# Patient Record
Sex: Male | Born: 1940
Health system: Southern US, Community
[De-identification: ages and names within clinical notes are randomized; demographics above are authoritative.]

## PROBLEM LIST (undated history)

## (undated) DIAGNOSIS — N189 Chronic kidney disease, unspecified: Secondary | ICD-10-CM

## (undated) DIAGNOSIS — I10 Essential (primary) hypertension: Secondary | ICD-10-CM

## (undated) DIAGNOSIS — J449 Chronic obstructive pulmonary disease, unspecified: Secondary | ICD-10-CM

## (undated) DIAGNOSIS — E119 Type 2 diabetes mellitus without complications: Secondary | ICD-10-CM

## (undated) DIAGNOSIS — I509 Heart failure, unspecified: Secondary | ICD-10-CM

## (undated) DIAGNOSIS — N289 Disorder of kidney and ureter, unspecified: Secondary | ICD-10-CM

## (undated) DIAGNOSIS — J45909 Unspecified asthma, uncomplicated: Secondary | ICD-10-CM

## (undated) HISTORY — DX: Chronic kidney disease, unspecified: N18.9

## (undated) HISTORY — DX: Heart failure, unspecified: I50.9

## (undated) HISTORY — DX: Type 2 diabetes mellitus without complications: E11.9

## (undated) HISTORY — DX: Chronic obstructive pulmonary disease, unspecified: J44.9

## (undated) HISTORY — PX: APPENDECTOMY: SHX54

## (undated) HISTORY — PX: CHOLECYSTECTOMY: SHX55

## (undated) HISTORY — PX: NEPHRECTOMY: SHX65

## (undated) HISTORY — PX: NASAL SINUS SURGERY: SHX719

---

## 2005-06-11 ENCOUNTER — Ambulatory Visit: Payer: Self-pay | Admitting: Cardiology

## 2005-06-21 ENCOUNTER — Ambulatory Visit: Payer: Self-pay | Admitting: Cardiology

## 2005-06-25 ENCOUNTER — Ambulatory Visit: Payer: Self-pay | Admitting: Cardiology

## 2005-08-26 ENCOUNTER — Ambulatory Visit: Payer: Self-pay | Admitting: Cardiology

## 2006-10-08 HISTORY — PX: COLONOSCOPY: SHX174

## 2010-12-16 ENCOUNTER — Ambulatory Visit (INDEPENDENT_AMBULATORY_CARE_PROVIDER_SITE_OTHER): Payer: Self-pay | Admitting: Internal Medicine

## 2011-10-28 ENCOUNTER — Encounter: Payer: Self-pay | Admitting: Internal Medicine

## 2011-10-28 ENCOUNTER — Other Ambulatory Visit: Payer: Self-pay | Admitting: Internal Medicine

## 2011-11-18 ENCOUNTER — Other Ambulatory Visit: Payer: Self-pay

## 2011-11-24 ENCOUNTER — Encounter: Payer: Self-pay | Admitting: Internal Medicine

## 2011-11-26 ENCOUNTER — Encounter: Payer: Self-pay | Admitting: *Deleted

## 2011-11-29 ENCOUNTER — Encounter: Payer: Self-pay | Admitting: *Deleted

## 2011-11-29 ENCOUNTER — Other Ambulatory Visit: Payer: Self-pay | Admitting: Cardiology

## 2011-11-29 ENCOUNTER — Encounter: Payer: Self-pay | Admitting: Cardiology

## 2011-11-29 ENCOUNTER — Ambulatory Visit (INDEPENDENT_AMBULATORY_CARE_PROVIDER_SITE_OTHER): Payer: Medicare Other | Admitting: Cardiology

## 2011-11-29 ENCOUNTER — Telehealth: Payer: Self-pay | Admitting: *Deleted

## 2011-11-29 VITALS — BP 142/69 | HR 87 | Ht 66.0 in | Wt 206.8 lb

## 2011-11-29 DIAGNOSIS — R9430 Abnormal result of cardiovascular function study, unspecified: Secondary | ICD-10-CM

## 2011-11-29 DIAGNOSIS — N189 Chronic kidney disease, unspecified: Secondary | ICD-10-CM | POA: Insufficient documentation

## 2011-11-29 DIAGNOSIS — E119 Type 2 diabetes mellitus without complications: Secondary | ICD-10-CM

## 2011-11-29 DIAGNOSIS — I428 Other cardiomyopathies: Secondary | ICD-10-CM

## 2011-11-29 DIAGNOSIS — I429 Cardiomyopathy, unspecified: Secondary | ICD-10-CM | POA: Insufficient documentation

## 2011-11-29 DIAGNOSIS — R0602 Shortness of breath: Secondary | ICD-10-CM

## 2011-11-29 DIAGNOSIS — E1122 Type 2 diabetes mellitus with diabetic chronic kidney disease: Secondary | ICD-10-CM | POA: Insufficient documentation

## 2011-11-29 DIAGNOSIS — R079 Chest pain, unspecified: Secondary | ICD-10-CM

## 2011-11-29 DIAGNOSIS — R0789 Other chest pain: Secondary | ICD-10-CM

## 2011-11-29 LAB — PROTIME-INR

## 2011-11-29 NOTE — Assessment & Plan Note (Signed)
The patient has a mildly reduced ejection fraction. I will followup with an echocardiogram.

## 2011-11-29 NOTE — Patient Instructions (Signed)
Your physician recommends that you continue on your current medications as directed. Please refer to the Current Medication list given to you today. Your physician has requested that you have an echocardiogram. Echocardiography is a painless test that uses sound waves to create images of your heart. It provides your doctor with information about the size and shape of your heart and how well your heart's chambers and valves are working. This procedure takes approximately one hour. There are no restrictions for this procedure. Your physician has requested that you have a cardiac catheterization. Cardiac catheterization is used to diagnose and/or treat various heart conditions. Doctors may recommend this procedure for a number of different reasons. The most common reason is to evaluate chest pain. Chest pain can be a symptom of coronary artery disease (CAD), and cardiac catheterization can show whether plaque is narrowing or blocking your heart's arteries. This procedure is also used to evaluate the valves, as well as measure the blood flow and oxygen levels in different parts of your heart. For further information please visit www.cardiosmart.org. Please follow instruction sheet, as given.  

## 2011-11-29 NOTE — Assessment & Plan Note (Signed)
His most recent hemoglobin A1c was 6.0. He will continue on the meds as listed.

## 2011-11-29 NOTE — Assessment & Plan Note (Signed)
As above.

## 2011-11-29 NOTE — Telephone Encounter (Signed)
ECHO BEFORE CATH ON THURS. PRE-CERT CATH (L) JV LAB W/HYDRATION 4/25 0730 FOR 1230 CASE W/HOCHREIN

## 2011-11-29 NOTE — Assessment & Plan Note (Signed)
The probability of obstructive coronary disease is very high given the abnormal EKG, symptoms and stress test. Cardiac catheterization is indicated. He does have only one kidney and mild renal insufficiency. I have discussed this with the patient and his family. At this point I think cardiac catheterization is indicated with limited diet. He'll have hydration for several hours prior. I do note that his GFR was approximately 50 recently. The patient understands that risks included but are not limited to stroke (1 in 1000), death (1 in 1000), kidney failure [usually temporary] (1 in 500), bleeding (1 in 200), allergic reaction [possibly serious] (1 in 200).  The patient understands and agrees to proceed.

## 2011-11-29 NOTE — Progress Notes (Signed)
HPI The patient has no prior history of coronary disease though he has long-standing diabetes. He's been describing some chest discomfort. There are various types of discomfort. He has bilateral pain under his breasts it seems to be constant. He is also describing chest pressure with walking. This has been going on for some time. He does get short of breath with walking and increasing fatigue with activities. He also had some discomfort after eating. He is finding it difficult to quantify or qualify this discomfort. The exertional discomfort somewhat moderate. 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  He does not describe associated nausea vomiting or diaphoresis. It is not describing palpitations, presyncope or syncope. He has no PND or orthopnea.  Because of this discomfort, risk factors and the abnormal EKG he had a stress perfusion study which demonstrated EF of 43% with anterolateral apical ischemia.  Allergies  Allergen Reactions  . Amoxicillin   . Ciprofloxacin   . Sulfa Antibiotics     Current Outpatient Prescriptions  Medication Sig Dispense Refill  . albuterol (PROAIR HFA) 108 (90 BASE) MCG/ACT inhaler Inhale 2 puffs into the lungs every 6 (six) hours as needed.      . Alpha-Lipoic Acid 200 MG TABS Take 1 tablet by mouth 2 (two) times daily.      [name] doxycycline (VIBRAMYCIN) 100 MG capsule Take 100 mg by mouth 2 (two) times daily.      . ferrous sulfate 325 (65 FE) MG tablet Take 325 mg by mouth daily.      [name] glipiZIDE (GLUCOTROL XL) 5 MG 24 hr tablet Take 5 mg by mouth daily.      . isosorbide mononitrate (IMDUR) 30 MG 24 hr tablet Take 30 mg by mouth daily.      . montelukast (SINGULAIR) 10 MG tablet Take 10 mg by mouth at bedtime.      . Omega-3 Fatty Acids (FISH OIL) 1000 MG CAPS Take 1 capsule by mouth 2 (two) times daily.      . vitamin B-12 (CYANOCOBALAMIN) 500 MCG tablet Take 500 mcg by mouth daily.      . vitamin E 200 UNIT  capsule Take 400 Units by mouth daily.      . Multiple Vitamin (MULTIVITAMIN) tablet Take 1 tablet by mouth daily.        Past Medical History  Diagnosis Date  . Type 2 diabetes mellitus   . COPD (chronic obstructive pulmonary disease)   . Chronic renal insufficiency     Past Surgical History  Procedure Date  . Cholecystectomy   . Nephrectomy     RIGHT (Question CA.  No further therapy needed.)  . Nasal sinus surgery   . Appendectomy     Family History  Problem Relation Age of Onset  . Lung cancer Brother   . Stroke Mother   . Breast cancer Mother     History   Social History  . Marital Status: Married    Spouse Name: N/A    Number of Children: N/A  . Years of Education: N/A   Occupational History  . Not on file.   Social History Main Topics  . Smoking status: Former Smoker    Quit date: 08/09/1976  . Smokeless tobacco: Not on file  . Alcohol Use: Not on file  . Drug Use: Not on file  . Sexually Active: Not on file   Other Topics Concern  . Not on file  Social History Narrative   Lives with wife.  Three children.      ROS:    PHYSICAL EXAM BP 142/69  Pulse 87  Ht 5\' 6"  (1.676 m)  Wt 206 lb 12.8 oz (93.804 kg)  BMI 33.38 kg/m2 GENERAL:  Well appearing HEENT:  Pupils equal round and reactive, fundi not visualized, oral mucosa unremarkable, upper dentures. NECK:  No jugular venous distention, waveform within normal limits, carotid upstroke brisk and symmetric, no bruits, no thyromegaly LYMPHATICS:  No cervical, inguinal adenopathy LUNGS:  Clear to auscultation bilaterally BACK:  No CVA tenderness CHEST:  Unremarkable HEART:  PMI not displaced or sustained,S1 and S2 within normal limits, no S3, no S4, no clicks, no rubs, no murmurs ABD:  Flat, positive bowel sounds normal in frequency in pitch, no bruits, no rebound, no guarding, no midline pulsatile mass, no hepatomegaly, no splenomegaly EXT:  2 plus pulses throughout, no edema, no cyanosis no  clubbing SKIN:  No rashes no nodules NEURO:  Cranial nerves II through XII grossly intact, motor grossly intact throughout PSYCH:  Cognitively intact, oriented to person place and time  EKG:  Normal sinus rhythm, rate 88, right axis deviation, intervals within normal limits, lateral T-wave inversions consistent with ischemia, no old EKGs to compare. 11/29/2011  ASSESSMENT AND PLAN

## 2011-11-30 NOTE — Telephone Encounter (Signed)
[employer] not eligible until 12/08/11.  Spoke with Misty Stanley and Mathews @ [employer].  Also verified by automated system.

## 2011-12-02 ENCOUNTER — Inpatient Hospital Stay (HOSPITAL_BASED_OUTPATIENT_CLINIC_OR_DEPARTMENT_OTHER)
Admission: RE | Admit: 2011-12-02 | Discharge: 2011-12-02 | Disposition: A | Discharge status: Home / Self Care | Payer: Medicare Other | Source: Ambulatory Visit | Attending: Cardiology | Admitting: Cardiology

## 2011-12-02 ENCOUNTER — Encounter (HOSPITAL_BASED_OUTPATIENT_CLINIC_OR_DEPARTMENT_OTHER)
Admission: RE | Disposition: A | Discharge status: Home / Self Care | Payer: Self-pay | Source: Ambulatory Visit | Attending: Cardiology

## 2011-12-02 DIAGNOSIS — R9430 Abnormal result of cardiovascular function study, unspecified: Secondary | ICD-10-CM

## 2011-12-02 DIAGNOSIS — I428 Other cardiomyopathies: Secondary | ICD-10-CM | POA: Insufficient documentation

## 2011-12-02 DIAGNOSIS — E119 Type 2 diabetes mellitus without complications: Secondary | ICD-10-CM | POA: Insufficient documentation

## 2011-12-02 LAB — POCT I-STAT GLUCOSE
Glucose, Bld: 77 mg/dL (ref 70–99)
Operator id: 141321

## 2011-12-02 SURGERY — JV LEFT HEART CATHETERIZATION WITH CORONARY ANGIOGRAM
Anesthesia: Moderate Sedation

## 2011-12-02 MED ORDER — ASPIRIN 81 MG PO CHEW
324.0000 mg | CHEWABLE_TABLET | ORAL | Status: AC
  Administered 2011-12-02: 324 mg via ORAL

## 2011-12-02 MED ORDER — SODIUM CHLORIDE 0.9 % IJ SOLN: 3.0000 mL | INTRAMUSCULAR | Status: DC | PRN

## 2011-12-02 MED ORDER — SODIUM CHLORIDE 0.9 % IV SOLN: INTRAVENOUS | Status: DC

## 2011-12-02 MED ORDER — SODIUM CHLORIDE 0.9 % IJ SOLN: 3.0000 mL | Freq: Two times a day (BID) | INTRAMUSCULAR | Status: DC

## 2011-12-02 MED ORDER — SODIUM CHLORIDE 0.9 % IV SOLN
INTRAVENOUS | Status: DC
  Administered 2011-12-02: 08:00:00 via INTRAVENOUS

## 2011-12-02 MED ORDER — SODIUM CHLORIDE 0.9 % IV SOLN: 250.0000 mL | INTRAVENOUS | Status: DC | PRN

## 2011-12-02 MED ORDER — ONDANSETRON HCL 4 MG/2ML IJ SOLN: 4.0000 mg | Freq: Four times a day (QID) | INTRAMUSCULAR | Status: DC | PRN

## 2011-12-02 MED ORDER — ACETAMINOPHEN 325 MG PO TABS: 650.0000 mg | ORAL_TABLET | ORAL | Status: DC | PRN

## 2011-12-02 NOTE — OR Nursing (Signed)
Tegaderm dressing applied, site level 0, bedrest begins at 1215 

## 2011-12-02 NOTE — Interval H&P Note (Signed)
History and Physical Interval Note:  12/02/2011 12:11 PM  Gregory Mitchell  has presented today for surgery, with the diagnosis of abnormal scan  The various methods of treatment have been discussed with the patient and family. After consideration of risks, benefits and other options for treatment, the patient has consented to  Procedure(s) (LRB): JV LEFT HEART CATHETERIZATION WITH CORONARY ANGIOGRAM (N/A) as a surgical intervention .  The patients' history has been reviewed, patient examined, no change in status, stable for surgery.  I have reviewed the patients' chart and labs.  Questions were answered to the patient's satisfaction.     Rollene Rotunda

## 2011-12-02 NOTE — H&P (View-Only) (Signed)
 HPI The patient has no prior history of coronary disease though he has long-standing diabetes. He's been describing some chest discomfort. There are various types of discomfort. He has bilateral pain under his breasts it seems to be constant. He is also describing chest pressure with walking. This has been going on for some time. He does get short of breath with walking and increasing fatigue with activities. He also had some discomfort after eating. He is finding it difficult to quantify or qualify this discomfort. The exertional discomfort somewhat moderate. 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  He does not describe associated nausea vomiting or diaphoresis. It is not describing palpitations, presyncope or syncope. He has no PND or orthopnea.  Because of this discomfort, risk factors and the abnormal EKG he had a stress perfusion study which demonstrated EF of 43% with anterolateral apical ischemia.  Allergies  Allergen Reactions  . Amoxicillin   . Ciprofloxacin   . Sulfa Antibiotics     Current Outpatient Prescriptions  Medication Sig Dispense Refill  . albuterol (PROAIR HFA) 108 (90 BASE) MCG/ACT inhaler Inhale 2 puffs into the lungs every 6 (six) hours as needed.      . Alpha-Lipoic Acid 200 MG TABS Take 1 tablet by mouth 2 (two) times daily.      . doxycycline (VIBRAMYCIN) 100 MG capsule Take 100 mg by mouth 2 (two) times daily.      . ferrous sulfate 325 (65 FE) MG tablet Take 325 mg by mouth daily.      . glipiZIDE (GLUCOTROL XL) 5 MG 24 hr tablet Take 5 mg by mouth daily.      . isosorbide mononitrate (IMDUR) 30 MG 24 hr tablet Take 30 mg by mouth daily.      . montelukast (SINGULAIR) 10 MG tablet Take 10 mg by mouth at bedtime.      . Omega-3 Fatty Acids (FISH OIL) 1000 MG CAPS Take 1 capsule by mouth 2 (two) times daily.      . vitamin B-12 (CYANOCOBALAMIN) 500 MCG tablet Take 500 mcg by mouth daily.      . vitamin E 200 UNIT  capsule Take 400 Units by mouth daily.      . Multiple Vitamin (MULTIVITAMIN) tablet Take 1 tablet by mouth daily.        Past Medical History  Diagnosis Date  . Type 2 diabetes mellitus   . COPD (chronic obstructive pulmonary disease)   . Chronic renal insufficiency     Past Surgical History  Procedure Date  . Cholecystectomy   . Nephrectomy     RIGHT (Question CA.  No further therapy needed.)  . Nasal sinus surgery   . Appendectomy     Family History  Problem Relation Age of Onset  . Lung cancer Brother   . Stroke Mother   . Breast cancer Mother     History   Social History  . Marital Status: Married    Spouse Name: N/A    Number of Children: N/A  . Years of Education: N/A   Occupational History  . Not on file.   Social History Main Topics  . Smoking status: Former Smoker    Quit date: 08/09/1976  . Smokeless tobacco: Not on file  . Alcohol Use: Not on file  . Drug Use: Not on file  . Sexually Active: Not on file   Other Topics Concern  . Not on file     Social History Narrative   Lives with wife.  Three children.      ROS:    PHYSICAL EXAM BP 142/69  Pulse 87  Ht 5' 6" (1.676 m)  Wt 206 lb 12.8 oz (93.804 kg)  BMI 33.38 kg/m2 GENERAL:  Well appearing HEENT:  Pupils equal round and reactive, fundi not visualized, oral mucosa unremarkable, upper dentures. NECK:  No jugular venous distention, waveform within normal limits, carotid upstroke brisk and symmetric, no bruits, no thyromegaly LYMPHATICS:  No cervical, inguinal adenopathy LUNGS:  Clear to auscultation bilaterally BACK:  No CVA tenderness CHEST:  Unremarkable HEART:  PMI not displaced or sustained,S1 and S2 within normal limits, no S3, no S4, no clicks, no rubs, no murmurs ABD:  Flat, positive bowel sounds normal in frequency in pitch, no bruits, no rebound, no guarding, no midline pulsatile mass, no hepatomegaly, no splenomegaly EXT:  2 plus pulses throughout, no edema, no cyanosis no  clubbing SKIN:  No rashes no nodules NEURO:  Cranial nerves II through XII grossly intact, motor grossly intact throughout PSYCH:  Cognitively intact, oriented to person place and time  EKG:  Normal sinus rhythm, rate 88, right axis deviation, intervals within normal limits, lateral T-wave inversions consistent with ischemia, no old EKGs to compare. 11/29/2011  ASSESSMENT AND PLAN    

## 2011-12-02 NOTE — CV Procedure (Signed)
  Cardiac Catheterization Procedure Note  Name: Gregory Mitchell MRN: [id number] DOB: 25-Sep-1940  Procedure: Left Heart Cath, Selective Coronary Angiography, LV angiography  Indication:  Cardiomyopathy, abnormal Myoview  Procedural details: The right groin was prepped, draped, and anesthetized with 1% lidocaine. Using modified Seldinger technique, a 4 French sheath was introduced into the right femoral artery. Standard Judkins catheters were used for coronary angiography and left ventriculography. Catheter exchanges were performed over a guidewire. There were no immediate procedural complications. The patient was transferred to the post catheterization recovery area for further monitoring.  Procedural Findings:   Hemodynamics:     AO 157/77    LV 154/22   Coronary angiography:  Coronary dominance: Right  Left mainstem:   Normal  Left anterior descending (LAD):  Large vessel wrapping the apex.   Mild lumina irregularities.  D1 small normal.  D2 small normal.  Left circumflex (LCx):  AV group luminal irregularities.  OM1 large and normal.  PL x 2 small normal  Right coronary artery (RCA):  Dominant.  Long mid 25%.  PDA normal.  PL moderate and normal.    Left ventriculography:  Left ventricle not injected  Final Conclusions:  Mild coronary plaque.  Non ischemic cardiomyopathy  Recommendations:   Medical management.    Fayrene Fearing Milbern Doescher 12/02/2011, 12:12 PM

## 2011-12-02 NOTE — OR Nursing (Signed)
Dr Hochrein at bedside to discuss results and treatment plan with pt and family 

## 2011-12-02 NOTE — OR Nursing (Signed)
Discharge instructions reviewed and signed, pt stated understanding, ambulated in hall without difficulty, site level 0, transported to wife's car via wheelchair 

## 2012-01-11 ENCOUNTER — Encounter: Payer: Self-pay | Admitting: Cardiology

## 2012-01-11 ENCOUNTER — Ambulatory Visit (INDEPENDENT_AMBULATORY_CARE_PROVIDER_SITE_OTHER): Payer: Medicare Other | Admitting: Cardiology

## 2012-01-11 VITALS — BP 144/76 | HR 89 | Resp 18 | Ht 66.0 in | Wt 206.0 lb

## 2012-01-11 DIAGNOSIS — E669 Obesity, unspecified: Secondary | ICD-10-CM | POA: Insufficient documentation

## 2012-01-11 DIAGNOSIS — I429 Cardiomyopathy, unspecified: Secondary | ICD-10-CM

## 2012-01-11 DIAGNOSIS — N189 Chronic kidney disease, unspecified: Secondary | ICD-10-CM

## 2012-01-11 MED ORDER — LISINOPRIL 5 MG PO TABS: 5.0000 mg | ORAL_TABLET | Freq: Every day | ORAL | Status: DC

## 2012-01-11 NOTE — Patient Instructions (Addendum)
Your physician recommends that you schedule a follow-up appointment in: 1 month with Gene.  Your physician has recommended you make the following change in your medication: start lisinopril 5 mg daily. Your new prescription has been sent to your pharmacy. All other medications remain the same.  Your physician recommends that you return for lab work in: June 18th 2013 at [facility].

## 2012-01-11 NOTE — Assessment & Plan Note (Signed)
This is nonanginal and likely related to a GI etiology. I have suggested followup with his primary provider.

## 2012-01-11 NOTE — Assessment & Plan Note (Signed)
The patient understands the need to lose weight with diet and exercise. We have discussed specific strategies for this.  

## 2012-01-11 NOTE — Progress Notes (Signed)
   HPI The patient presents for followup of a cardiomyopathy. He has been found to have an EF of about 35-40% by echo. However, catheterization demonstrated only minimal coronary plaque. He does still describe some chest discomfort as mentioned previously. However, this seems to be related to food.  He denies any new symptoms.  He does have some chronic dyspnea with exertion but is not describing new PND or orthopnea. He had no problems following a catheterization.  Allergies  Allergen Reactions  . Amoxicillin   . Ciprofloxacin   . Sulfa Antibiotics     Current Outpatient Prescriptions  Medication Sig Dispense Refill  . albuterol (PROAIR HFA) 108 (90 BASE) MCG/ACT inhaler Inhale 2 puffs into the lungs every 6 (six) hours as needed.      . Alpha-Lipoic Acid 200 MG TABS Take 1 tablet by mouth 2 (two) times daily.      . Cimetidine (ACID REDUCER PO) Take by mouth 2 (two) times daily.      . ferrous sulfate 325 (65 FE) MG tablet Take 325 mg by mouth daily.      [name] glipiZIDE (GLUCOTROL XL) 5 MG 24 hr tablet Take 5 mg by mouth daily.      . montelukast (SINGULAIR) 10 MG tablet Take 10 mg by mouth at bedtime.      . Omega-3 Fatty Acids (FISH OIL) 1000 MG CAPS Take 1 capsule by mouth 2 (two) times daily.      . Tamsulosin HCl (FLOMAX) 0.4 MG CAPS Take 0.4 mg by mouth daily.      . vitamin B-12 (CYANOCOBALAMIN) 500 MCG tablet Take 500 mcg by mouth daily.      . vitamin E 200 UNIT capsule Take 400 Units by mouth daily.        Past Medical History  Diagnosis Date  . Type 2 diabetes mellitus   . COPD (chronic obstructive pulmonary disease)   . Chronic renal insufficiency     Past Surgical History  Procedure Date  . Cholecystectomy   . Nephrectomy     RIGHT (Question CA.  No further therapy needed.)  . Nasal sinus surgery   . Appendectomy     ROS:  As stated in the HPI and negative for all other systems.   PHYSICAL EXAM BP 144/76  Pulse 89  Resp 18  Ht 5\' 6"  (1.676 m)  Wt 206 lb  (93.441 kg)  BMI 33.25 kg/m2 GENERAL:  Well appearing HEENT:  Pupils equal round and reactive, fundi not visualized, oral mucosa unremarkable, upper dentures. NECK:  No jugular venous distention, waveform within normal limits, carotid upstroke brisk and symmetric, no bruits, no thyromegaly LYMPHATICS:  No cervical, inguinal adenopathy LUNGS:  Clear to auscultation bilaterally BACK:  No CVA tenderness CHEST:  Unremarkable HEART:  PMI not displaced or sustained,S1 and S2 within normal limits, no S3, no S4, no clicks, no rubs, no murmurs ABD:  Flat, positive bowel sounds normal in frequency in pitch, no bruits, no rebound, no guarding, no midline pulsatile mass, no hepatomegaly, no splenomegaly EXT:  2 plus pulses throughout, no edema, no cyanosis no clubbing    ASSESSMENT AND PLAN

## 2012-01-11 NOTE — Assessment & Plan Note (Signed)
This is nonischemic and possibly related to hypertension. I'm going to start med titration low-dose ACE inhibitor keeping a close eye on his renal function given his previous renal insufficiency. We will get a basic metabolic profile in 2 weeks.

## 2012-01-28 ENCOUNTER — Telehealth: Payer: Self-pay | Admitting: *Deleted

## 2012-01-28 NOTE — Telephone Encounter (Signed)
Patient informed via message machine. 

## 2012-01-28 NOTE — Telephone Encounter (Signed)
Message copied by Eustace Moore on Fri Jan 28, 2012  9:42 AM ------      Message from: Rollene Rotunda      Created: Thu Jan 27, 2012  9:17 AM       Labs OK

## 2012-02-11 ENCOUNTER — Ambulatory Visit (INDEPENDENT_AMBULATORY_CARE_PROVIDER_SITE_OTHER): Payer: Medicare Other | Admitting: Physician Assistant

## 2012-02-11 ENCOUNTER — Encounter: Payer: Self-pay | Admitting: Physician Assistant

## 2012-02-11 VITALS — BP 134/72 | HR 95 | Temp 101.4°F | Ht 68.0 in | Wt 201.0 lb

## 2012-02-11 DIAGNOSIS — R509 Fever, unspecified: Secondary | ICD-10-CM | POA: Insufficient documentation

## 2012-02-11 DIAGNOSIS — I429 Cardiomyopathy, unspecified: Secondary | ICD-10-CM

## 2012-02-11 DIAGNOSIS — N189 Chronic kidney disease, unspecified: Secondary | ICD-10-CM

## 2012-02-11 DIAGNOSIS — E119 Type 2 diabetes mellitus without complications: Secondary | ICD-10-CM

## 2012-02-11 MED ORDER — ASPIRIN EC 81 MG PO TBEC: 81.0000 mg | DELAYED_RELEASE_TABLET | Freq: Every day | ORAL | Status: DC

## 2012-02-11 MED ORDER — LOSARTAN POTASSIUM 50 MG PO TABS: 50.0000 mg | ORAL_TABLET | Freq: Every day | ORAL | Status: DC

## 2012-02-11 NOTE — Patient Instructions (Addendum)
Your physician recommends that you schedule a follow-up appointment in: 1 months with Dr. Antoine Poche.  Your physician has recommended you make the following change in your medication: stop lisinopril and start losartan 50 mg daily. Also start taking an enteric coated aspirin 81 daily. Your new prescriptions have been sent to your pharmacy. Your physician recommends that you return for lab work in: 1 week around July 12th 2013 at [facility]. Please go to Dr. Bartholomew Crews office now for office visit.

## 2012-02-11 NOTE — Assessment & Plan Note (Signed)
Followed by primary M.D. Ideally, he should be on a statin, which we will consider at next OV. He is on omega-3 fatty acids.

## 2012-02-11 NOTE — Assessment & Plan Note (Signed)
Stable by recent followup labs: BUN 10, creatinine 1.4, potassium 3.9.

## 2012-02-11 NOTE — Progress Notes (Signed)
Primary Cardiologist: Rollene Rotunda, MD   HPI: Patient presents for scheduled followup.  When last seen, he was started on low-dose lisinopril, for ongoing treatment of nonischemic cardio myopathy. Unfortunately, he developed impotence, but continued to take this medication. He completed his 30 day supply just yesterday.  Patient also has recently developed productive, yellowish colored sputum, and presents today with a fever of 101.4. He otherwise denies symptoms suggestive of decompensated heart failure.  Patient has lost 5 pounds since last OV. BP is also better controlled.  Allergies  Allergen Reactions  . Amoxicillin   . Ciprofloxacin   . Sulfa Antibiotics     Current Outpatient Prescriptions  Medication Sig Dispense Refill  . albuterol (PROAIR HFA) 108 (90 BASE) MCG/ACT inhaler Inhale 2 puffs into the lungs every 6 (six) hours as needed.      . Alpha-Lipoic Acid 200 MG TABS Take 1 tablet by mouth 2 (two) times daily.      [name] aspirin EC 81 MG tablet Take 81 mg by mouth daily.      . Cimetidine (ACID REDUCER PO) Take by mouth 2 (two) times daily.      . ferrous sulfate 325 (65 FE) MG tablet Take 325 mg by mouth daily.      [name] glipiZIDE (GLUCOTROL XL) 5 MG 24 hr tablet Take 5 mg by mouth daily.      [name] losartan (COZAAR) 50 MG tablet Take 50 mg by mouth daily.      . montelukast (SINGULAIR) 10 MG tablet Take 10 mg by mouth at bedtime.      . Omega-3 Fatty Acids (FISH OIL) 1000 MG CAPS Take 1 capsule by mouth 2 (two) times daily.      . Tamsulosin HCl (FLOMAX) 0.4 MG CAPS Take 0.4 mg by mouth daily.      . vitamin B-12 (CYANOCOBALAMIN) 500 MCG tablet Take 500 mcg by mouth daily.      . vitamin E 200 UNIT capsule Take 400 Units by mouth daily.        Past Medical History  Diagnosis Date  . Type 2 diabetes mellitus   . COPD (chronic obstructive pulmonary disease)   . Chronic renal insufficiency     Past Surgical History  Procedure Date  . Cholecystectomy   . Nephrectomy    RIGHT (Question CA.  No further therapy needed.)  . Nasal sinus surgery   . Appendectomy     History   Social History  . Marital Status: Married    Spouse Name: N/A    Number of Children: N/A  . Years of Education: N/A   Occupational History  . Not on file.   Social History Main Topics  . Smoking status: Former Smoker -- 1.0 packs/day for 15 years    Types: Cigarettes    Quit date: 08/09/1976  . Smokeless tobacco: Former Neurosurgeon    Types: Chew    Quit date: 02/10/2006   Comment: chewed tobacco for 10 years  . Alcohol Use: Not on file  . Drug Use: Not on file  . Sexually Active: Not on file   Other Topics Concern  . Not on file   Social History Narrative   Lives with wife.  Three children.      Family History  Problem Relation Age of Onset  . Lung cancer Brother   . Stroke Mother   . Breast cancer Mother     ROS: no nausea, vomiting; no fever, chills; no melena, hematochezia; no claudication  PHYSICAL EXAM: BP 134/72  Pulse 95  Temp 101.4 F (38.6 C)  Ht 5\' 8"  (1.727 m)  Wt 201 lb (91.173 kg)  BMI 30.56 kg/m2  SpO2 95% GENERAL: 71 year-old male, sitting upright; NAD HEENT: NCAT, PERRLA, EOMI; sclera clear; no xanthelasma; mild, by mask NECK: palpable bilateral carotid pulses, no bruits; no JVD; no TM LUNGS: Coarse expiratory wheezes and rhonchi, throughout CARDIAC: RRR (S1, S2); no significant murmurs; no rubs or gallops ABDOMEN: soft, non-tender; intact BS EXTREMETIES: intact distal pulses; no significant peripheral edema SKIN: warm/dry; no obvious rash/lesions MUSCULOSKELETAL: no joint deformity NEURO: no focal deficit; NL affect   EKG:    ASSESSMENT & PLAN:  Fever Will refer the patient directly to Dr. Olena Leatherwood 's office today, for evaluation and management of fever with associated productive cough.  Cardiomyopathy secondary Will discontinue lisinopril, in light of patient's reported development of impotence. Will substituted with 50 mg daily.  Of note, I considered adding carvedilol to his regimen. However, given his reported development of impotence, I am hesitant about adding a beta blocker, particularly a nonselective one. We will see how he first responds to Cozaar, and then consider carvedilol. Will check followup BMET in one week. Patient will also be started on ASA 81 mg daily. Return clinic visit with myself/Dr. Antoine Poche one month.  Chronic renal insufficiency Stable by recent followup labs: BUN 10, creatinine 1.4, potassium 3.9.  Type 2 diabetes mellitus Followed by primary M.D. Ideally, he should be on a statin, which we will consider at next OV. He is on omega-3 fatty acids.    Gene Naylani Bradner, PAC

## 2012-02-11 NOTE — Assessment & Plan Note (Signed)
Will discontinue lisinopril, in light of patient's reported development of impotence. Will substituted with 50 mg daily. Of note, I considered adding carvedilol to his regimen. However, given his reported development of impotence, I am hesitant about adding a beta blocker, particularly a nonselective one. We will see how he first responds to Cozaar, and then consider carvedilol. Will check followup BMET in one week. Patient will also be started on ASA 81 mg daily. Return clinic visit with myself/Dr. Antoine Poche one month.

## 2012-02-11 NOTE — Assessment & Plan Note (Signed)
Will refer the patient directly to Dr. Olena Leatherwood 's office today, for evaluation and management of fever with associated productive cough.

## 2012-02-22 ENCOUNTER — Telehealth: Payer: Self-pay | Admitting: *Deleted

## 2012-02-22 NOTE — Telephone Encounter (Signed)
Patient informed. 

## 2012-02-22 NOTE — Telephone Encounter (Signed)
Message copied by Eustace Moore on Tue Feb 22, 2012 11:11 AM ------      Message from: Lesle Chris      Created: Mon Feb 21, 2012  4:01 PM       [facility]            ----- Message -----         From: Prescott Parma, PA         Sent: 02/21/2012  12:59 PM           To: Lesle Chris, LPN            NL labs

## 2012-03-14 ENCOUNTER — Encounter: Payer: Self-pay | Admitting: Cardiology

## 2012-03-14 ENCOUNTER — Ambulatory Visit (INDEPENDENT_AMBULATORY_CARE_PROVIDER_SITE_OTHER): Payer: Medicare Other | Admitting: Cardiology

## 2012-03-14 VITALS — BP 129/67 | HR 87 | Ht 66.0 in | Wt 202.8 lb

## 2012-03-14 DIAGNOSIS — N189 Chronic kidney disease, unspecified: Secondary | ICD-10-CM

## 2012-03-14 DIAGNOSIS — I429 Cardiomyopathy, unspecified: Secondary | ICD-10-CM

## 2012-03-14 MED ORDER — SILDENAFIL CITRATE 25 MG PO TABS: 25.0000 mg | ORAL_TABLET | Freq: Every day | ORAL | Status: DC | PRN

## 2012-03-14 MED ORDER — OMEPRAZOLE 20 MG PO CPDR: 20.0000 mg | DELAYED_RELEASE_CAPSULE | Freq: Every day | ORAL | Status: DC

## 2012-03-14 NOTE — Patient Instructions (Signed)
   Stop Cimetidine   Change to Prilosec 20mg  daily  Viagra as needed Follow up in  2 months

## 2012-03-14 NOTE — Progress Notes (Signed)
HPI The patient presents for followup of a cardiomyopathy. He has been found to have an EF of about 35-40% by echo. However, catheterization demonstrated only minimal coronary plaque.  At the last visit he was taken off of ACE inhibitor and switch to Cozaar because of cough. He says he coughs when he eats. He does have some chronic lung disease and long-standing followup with Dr. Orson Aloe.  He says he didn't notice any change in his cough when switched to Cozaar. He's not describing new PND or orthopnea. He's not been having any chest pressure, neck or arm discomfort. He's had no palpitations, presyncope or syncope. He continues to complain of erectile dysfunction.  Allergies  Allergen Reactions  . Amoxicillin   . Ciprofloxacin   . Sulfa Antibiotics     Current Outpatient Prescriptions  Medication Sig Dispense Refill  . albuterol (PROAIR HFA) 108 (90 BASE) MCG/ACT inhaler Inhale 2 puffs into the lungs every 6 (six) hours as needed.      . Alpha-Lipoic Acid 200 MG TABS Take 1 tablet by mouth 2 (two) times daily.      [name] aspirin EC 81 MG tablet Take 1 tablet (81 mg total) by mouth daily.  30 tablet  6  . Cimetidine (ACID REDUCER PO) Take by mouth 2 (two) times daily.      . ferrous sulfate 325 (65 FE) MG tablet Take 325 mg by mouth daily.      . fluticasone (FLONASE) 50 MCG/ACT nasal spray Place 2 sprays into the nose 2 (two) times daily.       [name] glipiZIDE (GLUCOTROL XL) 5 MG 24 hr tablet Take 5 mg by mouth daily.      [name] losartan (COZAAR) 50 MG tablet Take 1 tablet (50 mg total) by mouth daily.  30 tablet  6  . montelukast (SINGULAIR) 10 MG tablet Take 10 mg by mouth at bedtime.      . Omega-3 Fatty Acids (FISH OIL) 1000 MG CAPS Take 1 capsule by mouth 2 (two) times daily.      [name] omeprazole (PRILOSEC) 40 MG capsule Take 40 mg by mouth daily.       . predniSONE (DELTASONE) 20 MG tablet Take 20 mg by mouth as needed.       . SYMBICORT 80-4.5 MCG/ACT inhaler Inhale 2 puffs into the lungs 2  (two) times daily.       . Tamsulosin HCl (FLOMAX) 0.4 MG CAPS Take 0.4 mg by mouth daily.      . vitamin B-12 (CYANOCOBALAMIN) 500 MCG tablet Take 500 mcg by mouth daily.      . vitamin E 200 UNIT capsule Take 400 Units by mouth daily.        Past Medical History  Diagnosis Date  . Type 2 diabetes mellitus   . COPD (chronic obstructive pulmonary disease)   . Chronic renal insufficiency     Past Surgical History  Procedure Date  . Cholecystectomy   . Nephrectomy     RIGHT (Question CA.  No further therapy needed.)  . Nasal sinus surgery   . Appendectomy     ROS:  As stated in the HPI and negative for all other systems.   PHYSICAL EXAM BP 129/67  Pulse 87  Ht 5\' 6"  (1.676 m)  Wt 202 lb 12.8 oz (91.989 kg)  BMI 32.73 kg/m2 GENERAL:  Well appearing HEENT:  Pupils equal round and reactive, fundi not visualized, oral mucosa unremarkable, upper dentures, poor dentition NECK:  No  jugular venous distention, waveform within normal limits, carotid upstroke brisk and symmetric, no bruits, no thyromegaly LYMPHATICS:  No cervical, inguinal adenopathy LUNGS:  Diffuse coarse scattered rhonchi, no fine crackles, no wheezing BACK:  No CVA tenderness CHEST:  Unremarkable HEART:  PMI not displaced or sustained,S1 and S2 within normal limits, no S3, no S4, no clicks, no rubs, no murmurs ABD:  Flat, positive bowel sounds normal in frequency in pitch, no bruits, no rebound, no guarding, no midline pulsatile mass, no hepatomegaly, no splenomegaly EXT:  2 plus pulses throughout, no edema, no cyanosis no clubbing   ASSESSMENT AND PLAN  Fever -  At the last visit he was complaining of fever that was treated with antibiotics by  [facility] A, MD.  His symptoms have resolved. No change in therapy is indicated.   Cardiomyopathy secondary -  I'm going to continue the current dose of Cozaar. I will not titrate at this visit but I might in the future. I'm going to avoid beta blockers particularly  because of his chronic lung disease with some evidence of reactive airway disease.  Chronic renal insufficiency -  This has been followed in a stable.  Cough -  I am going to switch to Prilosec as he may be having some element of reflux leading to his cough. Otherwise I last follow with HASANAJ,XAJE A, MD

## 2012-05-15 ENCOUNTER — Ambulatory Visit: Payer: Medicare Other | Admitting: Cardiology

## 2012-06-20 ENCOUNTER — Encounter: Payer: Self-pay | Admitting: Cardiology

## 2012-06-20 ENCOUNTER — Ambulatory Visit (INDEPENDENT_AMBULATORY_CARE_PROVIDER_SITE_OTHER): Payer: PRIVATE HEALTH INSURANCE | Admitting: Cardiology

## 2012-06-20 VITALS — BP 149/77 | HR 80 | Ht 66.0 in | Wt 209.0 lb

## 2012-06-20 DIAGNOSIS — R0789 Other chest pain: Secondary | ICD-10-CM

## 2012-06-20 DIAGNOSIS — T17908A Unspecified foreign body in respiratory tract, part unspecified causing other injury, initial encounter: Secondary | ICD-10-CM

## 2012-06-20 DIAGNOSIS — N189 Chronic kidney disease, unspecified: Secondary | ICD-10-CM

## 2012-06-20 DIAGNOSIS — R079 Chest pain, unspecified: Secondary | ICD-10-CM

## 2012-06-20 MED ORDER — SILDENAFIL CITRATE 100 MG PO TABS: 100.0000 mg | ORAL_TABLET | Freq: Every day | ORAL | Status: DC | PRN

## 2012-06-20 MED ORDER — LOSARTAN POTASSIUM 50 MG PO TABS: 50.0000 mg | ORAL_TABLET | Freq: Two times a day (BID) | ORAL | Status: DC

## 2012-06-20 NOTE — Patient Instructions (Addendum)
Your physician recommends that you schedule a follow-up appointment in: 2 months. Your physician has recommended you make the following change in your medication: Increase losartan 50 mg to twice daily. Your new prescription has been sent to your pharmacy. Also you have been given a prescription for viagra 100 mg to be used daily as needed. All other medications will remain the same. Your physician recommends that you return for lab work in 10 days around June 30, 2012 at [facility] for [employer]. You will be having a swallowing study at [facility].

## 2012-06-20 NOTE — Progress Notes (Signed)
HPI The patient presents for followup of a cardiomyopathy. He has been found to have an EF of about 35-40% by echo. However, catheterization demonstrated only minimal coronary plaque.  Since I last saw him he has had continued chest pain and shortness of breath only when he needs. This happens routinely. He otherwise is not describing any PND or orthopnea. He has not been having any palpitations, presyncope or syncope. He has not been having any chest pressure, neck or arm discomfort. He's been having no weight gain or edema.  Allergies  Allergen Reactions  . Amoxicillin   . Ciprofloxacin   . Sulfa Antibiotics     Current Outpatient Prescriptions  Medication Sig Dispense Refill  . albuterol (PROAIR HFA) 108 (90 BASE) MCG/ACT inhaler Inhale 2 puffs into the lungs every 6 (six) hours as needed.      . Alpha-Lipoic Acid 200 MG TABS Take 1 tablet by mouth 2 (two) times daily.      [name] aspirin EC 81 MG tablet Take 1 tablet (81 mg total) by mouth daily.  30 tablet  6  . doxycycline (VIBRA-TABS) 100 MG tablet Take 100 mg by mouth 2 (two) times daily. x10 days      . ferrous sulfate 325 (65 FE) MG tablet Take 325 mg by mouth daily.      . fluticasone (FLONASE) 50 MCG/ACT nasal spray Place 2 sprays into the nose 2 (two) times daily.       [name] glipiZIDE (GLUCOTROL XL) 5 MG 24 hr tablet Take 5 mg by mouth daily.      [name] losartan (COZAAR) 50 MG tablet Take 1 tablet (50 mg total) by mouth daily.  30 tablet  6  . montelukast (SINGULAIR) 10 MG tablet Take 10 mg by mouth at bedtime.      . Omega-3 Fatty Acids (FISH OIL) 1000 MG CAPS Take 1 capsule by mouth 2 (two) times daily.      [name] omeprazole (PRILOSEC) 20 MG capsule Take 1 capsule (20 mg total) by mouth daily.  30 capsule  6  . predniSONE (DELTASONE) 20 MG tablet Take 20 mg by mouth as needed.       . sildenafil (VIAGRA) 25 MG tablet Take 1 tablet (25 mg total) by mouth daily as needed for erectile dysfunction.  10 tablet  0  . SYMBICORT 80-4.5 MCG/ACT  inhaler Inhale 2 puffs into the lungs 2 (two) times daily.       . Tamsulosin HCl (FLOMAX) 0.4 MG CAPS Take 0.4 mg by mouth daily.      . vitamin B-12 (CYANOCOBALAMIN) 500 MCG tablet Take 500 mcg by mouth daily.      . vitamin E 200 UNIT capsule Take 400 Units by mouth daily.        Past Medical History  Diagnosis Date  . Type 2 diabetes mellitus   . COPD (chronic obstructive pulmonary disease)   . Chronic renal insufficiency     Past Surgical History  Procedure Date  . Cholecystectomy   . Nephrectomy     RIGHT (Question CA.  No further therapy needed.)  . Nasal sinus surgery   . Appendectomy     ROS:  As stated in the HPI and negative for all other systems.   PHYSICAL EXAM BP 149/77  Pulse 80  Ht 5\' 6"  (1.676 m)  Wt 209 lb (94.802 kg)  BMI 33.73 kg/m2  SpO2 94% GENERAL:  Well appearing HEENT:  Pupils equal round and reactive, fundi not  visualized, oral mucosa unremarkable, upper dentures, poor dentition NECK:  No jugular venous distention, waveform within normal limits, carotid upstroke brisk and symmetric, no bruits, no thyromegaly LYMPHATICS:  No cervical, inguinal adenopathy LUNGS:  Diffuse coarse scattered rhonchi, no fine crackles, no wheezing BACK:  No CVA tenderness CHEST:  Unremarkable HEART:  PMI not displaced or sustained,S1 and S2 within normal limits, no S3, no S4, no clicks, no rubs, no murmurs ABD:  Flat, positive bowel sounds normal in frequency in pitch, no bruits, no rebound, no guarding, no midline pulsatile mass, no hepatomegaly, no splenomegaly EXT:  2 plus pulses throughout, no edema, no cyanosis no clubbing   ASSESSMENT AND PLAN    Cardiomyopathy secondary -  I'm going to increase the current dose of Cozaar to 50 mg bid.   Chronic renal insufficiency -  I will check a BMET in 10 days.    Cough - I am schedule a swalling study to rule out aspiration.  HASANAJ,XAJE A, MD  HTN -  This is being managed in the context of treating his  CHF

## 2012-06-22 ENCOUNTER — Telehealth: Payer: Self-pay

## 2012-06-22 ENCOUNTER — Other Ambulatory Visit: Payer: Self-pay | Admitting: Cardiology

## 2012-06-22 DIAGNOSIS — T17908A Unspecified foreign body in respiratory tract, part unspecified causing other injury, initial encounter: Secondary | ICD-10-CM

## 2012-06-22 NOTE — Telephone Encounter (Signed)
SWALLOWING procedure set for 11-21 @ [facility] Checking percert

## 2012-06-27 NOTE — Telephone Encounter (Signed)
No precert required 

## 2012-07-04 ENCOUNTER — Telehealth: Payer: Self-pay | Admitting: *Deleted

## 2012-07-04 NOTE — Telephone Encounter (Signed)
Message copied by Eustace Moore on Tue Jul 04, 2012 10:16 AM ------      Message from: Rollene Rotunda      Created: Sun Jul 02, 2012  9:17 AM       No evidence of swallowing difficulty.  The etiology for the SOB with eating is not clear.  Call Mr. Vandevelde with the results and send results to [facility] . No further cardiac work up is planned.

## 2012-07-04 NOTE — Telephone Encounter (Signed)
Patient informed and copy sent to PCP. 

## 2012-07-26 ENCOUNTER — Telehealth: Payer: Self-pay | Admitting: *Deleted

## 2012-07-26 DIAGNOSIS — N189 Chronic kidney disease, unspecified: Secondary | ICD-10-CM

## 2012-07-26 NOTE — Telephone Encounter (Signed)
Given the slight increase in creat he should get a repeat BMET. ----- Message ----- From: Eustace Moore, LPN Sent: 16/05/9603 11:40 AM To: Rollene Rotunda, MD   Patient informed and lab order faxed to [facility] lab.

## 2012-07-27 ENCOUNTER — Telehealth: Payer: Self-pay | Admitting: Physician Assistant

## 2012-07-28 ENCOUNTER — Telehealth: Payer: Self-pay | Admitting: *Deleted

## 2012-07-28 NOTE — Telephone Encounter (Signed)
Patient informed and copy sent to PCP. 

## 2012-07-28 NOTE — Telephone Encounter (Signed)
Message copied by Eustace Moore on Fri Jul 28, 2012 11:19 AM ------      Message from: Rollene Rotunda      Created: Thu Jul 27, 2012  6:28 PM       Labs OK.  Call Mr. Corella with the results and send results to [facility] A, MD

## 2012-08-14 ENCOUNTER — Ambulatory Visit: Payer: PRIVATE HEALTH INSURANCE | Admitting: Cardiology

## 2012-08-23 ENCOUNTER — Encounter: Payer: Self-pay | Admitting: Cardiology

## 2012-08-23 ENCOUNTER — Encounter: Payer: Self-pay | Admitting: Physician Assistant

## 2012-08-23 ENCOUNTER — Ambulatory Visit (INDEPENDENT_AMBULATORY_CARE_PROVIDER_SITE_OTHER): Payer: Medicare Other | Admitting: Physician Assistant

## 2012-08-23 VITALS — BP 145/81 | HR 85 | Ht 66.0 in | Wt 211.0 lb

## 2012-08-23 DIAGNOSIS — R0602 Shortness of breath: Secondary | ICD-10-CM

## 2012-08-23 DIAGNOSIS — R0609 Other forms of dyspnea: Secondary | ICD-10-CM

## 2012-08-23 DIAGNOSIS — R0989 Other specified symptoms and signs involving the circulatory and respiratory systems: Secondary | ICD-10-CM

## 2012-08-23 DIAGNOSIS — E119 Type 2 diabetes mellitus without complications: Secondary | ICD-10-CM

## 2012-08-23 DIAGNOSIS — I10 Essential (primary) hypertension: Secondary | ICD-10-CM

## 2012-08-23 MED ORDER — ATORVASTATIN CALCIUM 20 MG PO TABS: 20.0000 mg | ORAL_TABLET | Freq: Every evening | ORAL | Status: DC

## 2012-08-23 NOTE — Assessment & Plan Note (Signed)
EF 35-40%, by echocardiogram, 11/2011. Cardiac catheterization yielded nonobstructive CAD. Recommend repeat echocardiogram in near future, if patient has persistent or worsening symptoms.

## 2012-08-23 NOTE — Assessment & Plan Note (Signed)
Continue current medication regimen and reassess at next OV.

## 2012-08-23 NOTE — Progress Notes (Signed)
Primary Cardiologist: Rollene Rotunda, MD   HPI: Scheduled 2 month followup.  When last seen, Dr. Antoine Poche increased losartan to 50 twice a day, for ongoing management of NICM. Followup labs, as follows:   - December 18: BUN 15, creatinine 1.4, potassium 4.3  Patient denies any significant change from baseline level of exercise tolerance, since last OV. He continues to have significant DOE, but only when walking up a flight of stairs or a slight incline. He denies any symptoms when walking on level ground. He denies any exertional CP. He reports compliance with medications, but does use some salt when preparing his food, but not afterwards. He does not weigh himself daily. He reportedly has had difficulty with his "asthma", and requires inhalers and nebulizer treatment at home. He quit smoking approximately 30 years ago. His weight is up 2 pounds since last OV.  Allergies  Allergen Reactions  . Amoxicillin   . Ciprofloxacin   . Sulfa Antibiotics     Current Outpatient Prescriptions  Medication Sig Dispense Refill  . albuterol (PROAIR HFA) 108 (90 BASE) MCG/ACT inhaler Inhale 2 puffs into the lungs every 6 (six) hours as needed.      . Alpha-Lipoic Acid 200 MG TABS Take 1 tablet by mouth 2 (two) times daily.      [name] aspirin EC 81 MG tablet Take 1 tablet (81 mg total) by mouth daily.  30 tablet  6  . doxycycline (VIBRA-TABS) 100 MG tablet Take 100 mg by mouth 2 (two) times daily. x10 days      . ferrous sulfate 325 (65 FE) MG tablet Take 325 mg by mouth daily.      . fluticasone (FLONASE) 50 MCG/ACT nasal spray Place 2 sprays into the nose 2 (two) times daily.       [name] glipiZIDE (GLUCOTROL XL) 5 MG 24 hr tablet Take 5 mg by mouth daily.      [name] losartan (COZAAR) 50 MG tablet Take 1 tablet (50 mg total) by mouth 2 (two) times daily.  60 tablet  3  . montelukast (SINGULAIR) 10 MG tablet Take 10 mg by mouth at bedtime.      . Omega-3 Fatty Acids (FISH OIL) 1000 MG CAPS Take 1 capsule by mouth 2  (two) times daily.      [name] omeprazole (PRILOSEC) 20 MG capsule Take 1 capsule (20 mg total) by mouth daily.  30 capsule  6  . predniSONE (DELTASONE) 20 MG tablet Take 20 mg by mouth as needed.       . sildenafil (VIAGRA) 100 MG tablet Take 1 tablet (100 mg total) by mouth daily as needed for erectile dysfunction.  10 tablet  0  . SYMBICORT 80-4.5 MCG/ACT inhaler Inhale 2 puffs into the lungs 2 (two) times daily.       . Tamsulosin HCl (FLOMAX) 0.4 MG CAPS Take 0.4 mg by mouth daily.      . vitamin B-12 (CYANOCOBALAMIN) 500 MCG tablet Take 500 mcg by mouth daily.      . vitamin E 200 UNIT capsule Take 400 Units by mouth daily.        Past Medical History  Diagnosis Date  . Type 2 diabetes mellitus   . COPD (chronic obstructive pulmonary disease)   . Chronic renal insufficiency     Past Surgical History  Procedure Date  . Cholecystectomy   . Nephrectomy     RIGHT (Question CA.  No further therapy needed.)  . Nasal sinus surgery   .  Appendectomy     History   Social History  . Marital Status: Married    Spouse Name: N/A    Number of Children: N/A  . Years of Education: N/A   Occupational History  . Not on file.   Social History Main Topics  . Smoking status: Former Smoker -- 1.0 packs/day for 15 years    Types: Cigarettes    Quit date: 08/09/1976  . Smokeless tobacco: Former Neurosurgeon    Types: Chew    Quit date: 02/10/2006     Comment: chewed tobacco for 10 years  . Alcohol Use: Not on file  . Drug Use: Not on file  . Sexually Active: Not on file   Other Topics Concern  . Not on file   Social History Narrative   Lives with wife.  Three children.      Family History  Problem Relation Age of Onset  . Lung cancer Brother   . Stroke Mother   . Breast cancer Mother     ROS: no nausea, vomiting; no fever, chills; no melena, hematochezia; no claudication  PHYSICAL EXAM: BP 145/81  Pulse 85  Ht 5\' 6"  (1.676 m)  Wt 211 lb (95.709 kg)  BMI 34.06 kg/m2 GENERAL: 72  year-old male; NAD HEENT: NCAT, PERRLA, EOMI; sclera clear; no xanthelasma NECK: palpable bilateral carotid pulses, no bruits; no JVD; no TM LUNGS: diffuse rhonchi, no rales/wheezes CARDIAC: RRR (S1, S2); no significant murmurs; no rubs or gallops ABDOMEN: soft, non-tender; intact BS EXTREMETIES: trace peripheral edema SKIN: warm/dry; no obvious rash/lesions MUSCULOSKELETAL: no joint deformity NEURO: no focal deficit; NL affect   EKG:    ASSESSMENT & PLAN:  Dyspnea on exertion We'll reassess with a CXR and followup labs, including BNP level. Will decide whether or not to place him on diuretic therapy, pending review of these results. Will reassess clinical status with me in one month.  Cardiomyopathy secondary EF 35-40%, by echocardiogram, 11/2011. Cardiac catheterization yielded nonobstructive CAD. Recommend repeat echocardiogram in near future, if patient has persistent or worsening symptoms.  Type 2 diabetes mellitus Patient currently not on a statin. We'll start Lipitor 20 mg daily and reassess lipid status in 12 weeks. Recommend target LDL 100 or less, if feasible.  HTN (hypertension) Continue current medication regimen and reassess at next OV.    Gene Racer Quam, PAC

## 2012-08-23 NOTE — Patient Instructions (Signed)
   Chest x-ray  Labs - BMET, BNP  Office will contact with results  Begin Lipitor 20mg  every evening  Labs - due in 3 months for fasting lipid and liver panel - will mail orders when time to repeat Continue all other current medications. Follow up in  1 month

## 2012-08-23 NOTE — Assessment & Plan Note (Signed)
We'll reassess with a CXR and followup labs, including BNP level. Will decide whether or not to place him on diuretic therapy, pending review of these results. Will reassess clinical status with me in one month.

## 2012-08-23 NOTE — Assessment & Plan Note (Signed)
Patient currently not on a statin. We'll start Lipitor 20 mg daily and reassess lipid status in 12 weeks. Recommend target LDL 100 or less, if feasible.

## 2012-08-24 ENCOUNTER — Telehealth: Payer: Self-pay | Admitting: *Deleted

## 2012-08-24 NOTE — Telephone Encounter (Signed)
Message copied by Lesle Chris on Thu Aug 24, 2012  3:45 PM ------      Message from: Gregory Mitchell      Created: Thu Aug 24, 2012 12:25 PM       Stable labs, BNP 95. No evidence of CHF on CXR. Therefore, continue current medication regimen.

## 2012-08-25 ENCOUNTER — Encounter: Payer: Self-pay | Admitting: *Deleted

## 2012-08-28 ENCOUNTER — Encounter: Payer: Self-pay | Admitting: *Deleted

## 2012-09-25 ENCOUNTER — Ambulatory Visit: Payer: Medicare Other | Admitting: Physician Assistant

## 2012-10-05 ENCOUNTER — Encounter: Payer: Self-pay | Admitting: Physician Assistant

## 2012-10-05 ENCOUNTER — Ambulatory Visit (INDEPENDENT_AMBULATORY_CARE_PROVIDER_SITE_OTHER): Payer: 59 | Admitting: Physician Assistant

## 2012-10-05 VITALS — BP 158/79 | HR 88 | Ht 66.0 in | Wt 210.1 lb

## 2012-10-05 DIAGNOSIS — I10 Essential (primary) hypertension: Secondary | ICD-10-CM

## 2012-10-05 DIAGNOSIS — E119 Type 2 diabetes mellitus without complications: Secondary | ICD-10-CM

## 2012-10-05 DIAGNOSIS — R0989 Other specified symptoms and signs involving the circulatory and respiratory systems: Secondary | ICD-10-CM

## 2012-10-05 DIAGNOSIS — R0609 Other forms of dyspnea: Secondary | ICD-10-CM

## 2012-10-05 MED ORDER — AMLODIPINE BESYLATE 5 MG PO TABS: 5.0000 mg | ORAL_TABLET | Freq: Every day | ORAL | Status: DC

## 2012-10-05 MED ORDER — SILDENAFIL CITRATE 100 MG PO TABS: 100.0000 mg | ORAL_TABLET | Freq: Every day | ORAL | Status: DC | PRN

## 2012-10-05 NOTE — Progress Notes (Signed)
Primary Cardiologist: Rollene Rotunda, MD   HPI: Scheduled one-month followup. Followup labs at time of last OV, as follows:   - January 15: BUN 14, creatinine 1.3 (1.4), and K 4.1. BNP 95   - CXR: NL heart size; no infiltrates/effusions; probable emphysema  He continues to report no exertional CP, but does have chronic DOE. He also complains of some chest tightness, but only with eating. He attributes this to his reflux disease. Also of note, he presented to a walk-in clinic yesterday in [place], [place], and was diagnosed with a "touch of pneumonia". He was placed on Augmentin.   Allergies  Allergen Reactions  . Amoxicillin   . Ciprofloxacin   . Sulfa Antibiotics     Current Outpatient Prescriptions  Medication Sig Dispense Refill  . albuterol (PROAIR HFA) 108 (90 BASE) MCG/ACT inhaler Inhale 2 puffs into the lungs every 6 (six) hours as needed.      . Alpha-Lipoic Acid 200 MG TABS Take 1 tablet by mouth 2 (two) times daily.      [name] amoxicillin-clavulanate (AUGMENTIN) 500-125 MG per tablet Take 1 tablet by mouth 2 (two) times daily.       [name] aspirin EC 81 MG tablet Take 1 tablet (81 mg total) by mouth daily.  30 tablet  6  . atorvastatin (LIPITOR) 20 MG tablet Take 1 tablet (20 mg total) by mouth every evening.  30 tablet  6  . ferrous sulfate 325 (65 FE) MG tablet Take 325 mg by mouth daily.      [name] gabapentin (NEURONTIN) 300 MG capsule Take 600 mg by mouth daily.       [name] glipiZIDE (GLUCOTROL XL) 5 MG 24 hr tablet Take 5 mg by mouth daily.      [name] HYDROcodone-acetaminophen (NORCO) 7.5-325 MG per tablet Take 1 tablet by mouth every 8 (eight) hours as needed.       [name] ketoconazole (NIZORAL) 2 % cream Apply 1 application topically as needed.       [name] losartan (COZAAR) 50 MG tablet Take 1 tablet (50 mg total) by mouth 2 (two) times daily.  60 tablet  3  . montelukast (SINGULAIR) 10 MG tablet Take 10 mg by mouth at bedtime.      [name] omeprazole (PRILOSEC) 20 MG capsule Take 1 capsule (20 mg total) by  mouth daily.  30 capsule  6  . predniSONE (DELTASONE) 20 MG tablet Take 20 mg by mouth as needed.       . promethazine-dextromethorphan (PROMETHAZINE-DM) 6.25-15 MG/5ML syrup Take 5 mLs by mouth.       . sildenafil (VIAGRA) 100 MG tablet Take 1 tablet (100 mg total) by mouth daily as needed for erectile dysfunction.  10 tablet  1  . SYMBICORT 80-4.5 MCG/ACT inhaler Inhale 2 puffs into the lungs 2 (two) times daily.       . Tamsulosin HCl (FLOMAX) 0.4 MG CAPS Take 0.4 mg by mouth daily.      . vitamin B-12 (CYANOCOBALAMIN) 500 MCG tablet Take 500 mcg by mouth daily.      . vitamin E 200 UNIT capsule Take 400 Units by mouth daily.      [name] amLODipine (NORVASC) 5 MG tablet Take 1 tablet (5 mg total) by mouth daily.  30 tablet  6   No current facility-administered medications for this visit.    Past Medical History  Diagnosis Date  . Type 2 diabetes mellitus   . COPD (chronic obstructive pulmonary disease)   . Chronic  renal insufficiency     Past Surgical History  Procedure Laterality Date  . Cholecystectomy    . Nephrectomy      RIGHT (Question CA.  No further therapy needed.)  . Nasal sinus surgery    . Appendectomy      History   Social History  . Marital Status: Married    Spouse Name: N/A    Number of Children: N/A  . Years of Education: N/A   Occupational History  . Not on file.   Social History Main Topics  . Smoking status: Former Smoker -- 1.00 packs/day for 15 years    Types: Cigarettes    Quit date: 08/09/1976  . Smokeless tobacco: Former Neurosurgeon    Types: Chew    Quit date: 02/10/2006     Comment: chewed tobacco for 10 years  . Alcohol Use: Not on file  . Drug Use: Not on file  . Sexually Active: Not on file   Other Topics Concern  . Not on file   Social History Narrative   Lives with wife.  Three children.      Family History  Problem Relation Age of Onset  . Lung cancer Brother   . Stroke Mother   . Breast cancer Mother     ROS: no nausea,  vomiting; no fever, chills; no melena, hematochezia; no claudication  PHYSICAL EXAM: BP 158/79  Pulse 88  Ht 5\' 6"  (1.676 m)  Wt 210 lb 1.9 oz (95.31 kg)  BMI 33.93 kg/m2 GENERAL: 72 year-old male; NAD  HEENT: NCAT, PERRLA, EOMI; sclera clear; no xanthelasma  NECK: no JVD; no TM  LUNGS: Left sided rhonchi with faint late crackles, otherwise clear on right CARDIAC: RRR (S1, S2); no significant murmurs; no rubs or gallops  ABDOMEN: soft, non-tender; intact BS  EXTREMETIES: trace peripheral edema  SKIN: warm/dry; no obvious rash/lesions  MUSCULOSKELETAL: no joint deformity  NEURO: no focal deficit; NL affect    EKG:    ASSESSMENT & PLAN:  Dyspnea on exertion Results of the CXR and BNP level were reviewed with the patient. I reassured him that I had no definite evidence that he was in CHF. He has even lost 1 pound since last OV. Therefore, I suspect that his long-standing DOE is secondary to primary lung disease, given his remote history of tobacco smoking. More recently, he appears to have probable bronchitis, as well as recently diagnosed CPAP. He was started on antibiotics yesterday. No further cardiac workup is indicated and we will reassess his clinical status in 6 months.   Cardiomyopathy secondary I initially considered starting him on carvedilol, for treatment of his cardiomyopathy. However, given his history of asthma, and reported occasional episodes of active wheezing, I declined to add even the more selective beta blocker, Toprol. Therefore, recommend continuing current medication regimen of low-dose ASA and ARB. Of note, patient requested a refill for Viagra, initially prescribed by Dr. Antoine Poche. We will renew this at the previous dose of 100 mg, #30 tablets, with 1 refill. I reviewed his current medication regimen with him, noting that he is not on any short or long-acting nitrates, and pointing out the possible significant adverse effect of taking these medications in  conjunction, within a 24-hour period.  HTN (hypertension) Will start amlodipine 5 mg daily for more aggressive management. I would be hesitant to further uptitrate losartan, given that he is status post nephrectomy.  Type 2 diabetes mellitus Patient was started on low-dose Lipitor at time of our last OV.  He is scheduled for a followup FLP. As previously noted, I recommend a target LDL of 100 or less, if feasible.    Gene Clorissa Gruenberg, PAC

## 2012-10-05 NOTE — Assessment & Plan Note (Signed)
Results of the CXR and BNP level were reviewed with the patient. I reassured him that I had no definite evidence that he was in CHF. He has even lost 1 pound since last OV. Therefore, I suspect that his long-standing DOE is secondary to primary lung disease, given his remote history of tobacco smoking. More recently, he appears to have probable bronchitis, as well as recently diagnosed CPAP. He was started on antibiotics yesterday. No further cardiac workup is indicated and we will reassess his clinical status in 6 months.

## 2012-10-05 NOTE — Patient Instructions (Addendum)
   Begin Norvasc 5mg  daily (new sent to pharm)  Viagra - refill sent to pharm Continue all other current medications. Your physician wants you to follow up in: 6 months.  You will receive a reminder letter in the mail one-two months in advance.  If you don't receive a letter, please call our office to schedule the follow up appointment

## 2012-10-05 NOTE — Assessment & Plan Note (Signed)
Will start amlodipine 5 mg daily for more aggressive management. I would be hesitant to further uptitrate losartan, given that he is status post nephrectomy.

## 2012-10-05 NOTE — Assessment & Plan Note (Addendum)
I initially considered starting him on carvedilol, for treatment of his cardiomyopathy. However, given his history of asthma, and reported occasional episodes of active wheezing, I declined to add even the more selective beta blocker, Toprol. Therefore, recommend continuing current medication regimen of low-dose ASA and ARB. Of note, patient requested a refill for Viagra, initially prescribed by Dr. Antoine Poche. We will renew this at the previous dose of 100 mg, #30 tablets, with 1 refill. I reviewed his current medication regimen with him, noting that he is not on any short or long-acting nitrates, and pointing out the possible significant adverse effect of taking these medications in conjunction, within a 24-hour period.

## 2012-10-05 NOTE — Assessment & Plan Note (Signed)
Patient was started on low-dose Lipitor at time of our last OV. He is scheduled for a followup FLP. As previously noted, I recommend a target LDL of 100 or less, if feasible.

## 2012-10-10 ENCOUNTER — Other Ambulatory Visit: Payer: Self-pay | Admitting: Cardiology

## 2012-10-10 MED ORDER — AMLODIPINE BESYLATE 5 MG PO TABS: 5.0000 mg | ORAL_TABLET | Freq: Every day | ORAL | Status: DC

## 2012-10-10 MED ORDER — ATORVASTATIN CALCIUM 20 MG PO TABS: 20.0000 mg | ORAL_TABLET | Freq: Every evening | ORAL | Status: DC

## 2012-11-21 ENCOUNTER — Encounter: Payer: Self-pay | Admitting: *Deleted

## 2012-11-21 ENCOUNTER — Other Ambulatory Visit: Payer: Self-pay | Admitting: *Deleted

## 2012-11-21 DIAGNOSIS — E119 Type 2 diabetes mellitus without complications: Secondary | ICD-10-CM

## 2012-11-21 DIAGNOSIS — Z79899 Other long term (current) drug therapy: Secondary | ICD-10-CM

## 2012-12-30 ENCOUNTER — Other Ambulatory Visit: Payer: Self-pay | Admitting: Cardiology

## 2013-01-02 ENCOUNTER — Other Ambulatory Visit: Payer: Self-pay

## 2013-01-02 NOTE — Telephone Encounter (Signed)
Please refill.

## 2013-04-05 ENCOUNTER — Encounter: Payer: Self-pay | Admitting: Cardiology

## 2013-04-05 ENCOUNTER — Ambulatory Visit (INDEPENDENT_AMBULATORY_CARE_PROVIDER_SITE_OTHER): Payer: 59 | Admitting: Cardiology

## 2013-04-05 VITALS — BP 147/83 | HR 76 | Ht 66.0 in | Wt 209.0 lb

## 2013-04-05 DIAGNOSIS — I429 Cardiomyopathy, unspecified: Secondary | ICD-10-CM

## 2013-04-05 DIAGNOSIS — I10 Essential (primary) hypertension: Secondary | ICD-10-CM

## 2013-04-05 NOTE — Progress Notes (Signed)
HPI The patient presents for followup of a cardiomyopathy. He has been found to have an EF of about 35-40% by echo. However, catheterization demonstrated only minimal coronary plaque last year.  Unfortunately he continues to have chest discomfort. He has significant dyspnea. I reviewed emergency room records most recently from July. He was thought to have some exacerbation of his chronic lung disease. There was no edema on chest x-ray. Earlier in the year he had a BNP which was normal. However, he continues to get dyspneic with mild or moderate activity. He continues to have the same kind of chest discomfort he was having previously. This is under his left breast. It is unchanged from that which she described at the time of his cath last year. His biggest complaint seems to be fatigue. He does report however that he doesn't sleep very well. He's not having any PND or orthopnea. He's not having any new palpitations, presyncope or syncope.  Allergies  Allergen Reactions  . Amoxicillin   . Ciprofloxacin   . Sulfa Antibiotics     Current Outpatient Prescriptions  Medication Sig Dispense Refill  . albuterol (PROAIR HFA) 108 (90 BASE) MCG/ACT inhaler Inhale 2 puffs into the lungs every 6 (six) hours as needed.      [name] albuterol (PROVENTIL) (2.5 MG/3ML) 0.083% nebulizer solution Take 2.5 mg by nebulization every 6 (six) hours as needed for wheezing.      [name] albuterol (PROVENTIL) 4 MG tablet Take 4 mg by mouth 2 (two) times daily.      . Alpha-Lipoic Acid 200 MG TABS Take 1 tablet by mouth 2 (two) times daily.      [name] amLODipine (NORVASC) 5 MG tablet Take 1 tablet (5 mg total) by mouth daily.  90 tablet  1  . atorvastatin (LIPITOR) 20 MG tablet Take 1 tablet (20 mg total) by mouth every evening.  90 tablet  1  . ferrous sulfate 325 (65 FE) MG tablet Take 325 mg by mouth daily.      [name] glipiZIDE (GLUCOTROL XL) 5 MG 24 hr tablet Take 5 mg by mouth daily.      [name] HYDROcodone-acetaminophen (NORCO) 7.5-325 MG  per tablet Take 1 tablet by mouth every 8 (eight) hours as needed.       [name] ketoconazole (NIZORAL) 2 % cream Apply 1 application topically as needed.       [name] losartan (COZAAR) 50 MG tablet TAKE 1 TABLET BY MOUTH TWICE DAILY EMERGENCY REFILL FAXED DR  60 tablet  6  . montelukast (SINGULAIR) 10 MG tablet Take 10 mg by mouth at bedtime.      . predniSONE (DELTASONE) 20 MG tablet Take 20 mg by mouth as needed.       . promethazine-dextromethorphan (PROMETHAZINE-DM) 6.25-15 MG/5ML syrup Take 5 mLs by mouth 4 (four) times daily as needed.       . ranitidine (ZANTAC) 300 MG tablet Take 300 mg by mouth at bedtime.      . sildenafil (VIAGRA) 100 MG tablet Take 1 tablet (100 mg total) by mouth daily as needed for erectile dysfunction.  10 tablet  1  . SYMBICORT 80-4.5 MCG/ACT inhaler Inhale 2 puffs into the lungs 2 (two) times daily.       . Tamsulosin HCl (FLOMAX) 0.4 MG CAPS Take 0.4 mg by mouth daily.      . vitamin E 200 UNIT capsule Take 400 Units by mouth daily.       No current facility-administered medications for this  visit.    Past Medical History  Diagnosis Date  . Type 2 diabetes mellitus   . COPD (chronic obstructive pulmonary disease)   . Chronic renal insufficiency     Past Surgical History  Procedure Laterality Date  . Cholecystectomy    . Nephrectomy      RIGHT (Question CA.  No further therapy needed.)  . Nasal sinus surgery    . Appendectomy      ROS:  As stated in the HPI and negative for all other systems.   PHYSICAL EXAM BP 147/83  Pulse 76  Ht 5\' 6"  (1.676 m)  Wt 209 lb (94.802 kg)  BMI 33.75 kg/m2 GENERAL:  Well appearing HEENT:  Pupils equal round and reactive, fundi not visualized, oral mucosa unremarkable, upper dentures, poor dentition NECK:  No jugular venous distention, waveform within normal limits, carotid upstroke brisk and symmetric, no bruits, no thyromegaly LYMPHATICS:  No cervical, inguinal adenopathy LUNGS:  Diffuse coarse scattered rhonchi, no  fine crackles, no wheezing BACK:  No CVA tenderness CHEST:  Unremarkable HEART:  PMI not displaced or sustained,S1 and S2 within normal limits, no S3, no S4, no clicks, no rubs, no murmurs ABD:  Flat, positive bowel sounds normal in frequency in pitch, no bruits, no rebound, no guarding, no midline pulsatile mass, no hepatomegaly, no splenomegaly EXT:  2 plus pulses throughout, no edema, no cyanosis no clubbing   ASSESSMENT AND PLAN    Cardiomyopathy secondary -  He does not appear to be volume overloaded on this appointment.I will get a chest x-ray from late July and it did not demonstrate edema. BNP earlier this year was normal. I do not suspect any of his significant dyspnea and to be cardiac. However, I will reassess his EF as he has been over one year.  Chronic renal insufficiency -  His creatinine in April was 1.25. I will defer followup to his primary provider.  Dyspnea - This is his predominant complaint. As above I suspect that this is predominantly pulmonary but will evaluate. At this point I am not changing his therapy.  HTN -  This is being managed in the context of treating his CHF. He will continue on the meds as listed.  Fagitue - This is likely related to difficulty sleeping and probably multifactorial. He does have polypharmacy. I will discontinue Lipitor to see if this helps at all. He has some muscle weakness. With his minimal coronary plaque there is not a strong indication for continued statin therapy.

## 2013-04-05 NOTE — Patient Instructions (Addendum)
Your physician recommends that you schedule a follow-up appointment in: 6 months. You will receive a reminder letter in the mail in about 4 months reminding you to call and schedule your appointment. If you don't receive this letter, please contact our office. Your physician has recommended you make the following change in your medication: STOP ATORVASTATIN. All other medications will remain the same. Your physician has requested that you have an echocardiogram. Echocardiography is a painless test that uses sound waves to create images of your heart. It provides your doctor with information about the size and shape of your heart and how well your heart's chambers and valves are working. This procedure takes approximately one hour. There are no restrictions for this procedure.

## 2013-04-12 ENCOUNTER — Other Ambulatory Visit (INDEPENDENT_AMBULATORY_CARE_PROVIDER_SITE_OTHER): Payer: 59

## 2013-04-12 ENCOUNTER — Other Ambulatory Visit: Payer: Self-pay

## 2013-04-12 DIAGNOSIS — R0602 Shortness of breath: Secondary | ICD-10-CM

## 2013-04-12 DIAGNOSIS — I429 Cardiomyopathy, unspecified: Secondary | ICD-10-CM

## 2013-04-12 DIAGNOSIS — R079 Chest pain, unspecified: Secondary | ICD-10-CM

## 2013-05-03 ENCOUNTER — Telehealth: Payer: Self-pay | Admitting: *Deleted

## 2013-05-03 NOTE — Telephone Encounter (Signed)
Message copied by Eustace Moore on Thu May 03, 2013  4:34 PM ------      Message from: Meredeth Ide, PAMELA J      Created: Tue May 01, 2013  2:47 PM                   ----- Message -----         From: Rollene Rotunda, MD         Sent: 04/29/2013   8:38 PM           To: Rocco Serene, RN            EF is slightly better than previous.  No change in therapy. Call Mr. Gahan with the results and send results to [facility] A, MD       ------

## 2013-05-03 NOTE — Telephone Encounter (Signed)
Patient informed and copy sent to PCP. 

## 2013-10-16 ENCOUNTER — Other Ambulatory Visit: Payer: Self-pay | Admitting: Physician Assistant

## 2013-10-23 ENCOUNTER — Encounter: Payer: Self-pay | Admitting: Cardiology

## 2013-10-23 ENCOUNTER — Ambulatory Visit (INDEPENDENT_AMBULATORY_CARE_PROVIDER_SITE_OTHER): Payer: 59 | Admitting: Cardiology

## 2013-10-23 VITALS — BP 138/72 | HR 85 | Ht 66.0 in | Wt 213.0 lb

## 2013-10-23 DIAGNOSIS — R0989 Other specified symptoms and signs involving the circulatory and respiratory systems: Secondary | ICD-10-CM

## 2013-10-23 DIAGNOSIS — R059 Cough, unspecified: Secondary | ICD-10-CM

## 2013-10-23 DIAGNOSIS — I429 Cardiomyopathy, unspecified: Secondary | ICD-10-CM

## 2013-10-23 DIAGNOSIS — R05 Cough: Secondary | ICD-10-CM

## 2013-10-23 DIAGNOSIS — R0609 Other forms of dyspnea: Secondary | ICD-10-CM

## 2013-10-23 MED ORDER — OMEPRAZOLE 20 MG PO CPDR: 20.0000 mg | DELAYED_RELEASE_CAPSULE | Freq: Every day | ORAL | Status: DC

## 2013-10-23 NOTE — Progress Notes (Signed)
HPI The patient presents for followup of a cardiomyopathy. He has been found to have an EF of about 35-40% by echo. However, catheterization demonstrated only minimal coronary plaque last year.  An echocardiogram last year demonstrated the EF to be about 45-50%. He continues to have complaints predominantly of cough productive of green or brown sputum particularly in the morning. He has chronic dyspnea with exertion. He does have chest discomfort particularly in the morning when his coughing. He can do some activities and is not bringing on this chest discomfort. His chest pain has been a chronic pattern since the time of his catheterization. He's not describing neck or arm pain. He's not describing PND or orthopnea. He has no new palpitations, presyncope or syncope.  Allergies  Allergen Reactions  . Amoxicillin   . Ciprofloxacin   . Sulfa Antibiotics     Current Outpatient Prescriptions  Medication Sig Dispense Refill  . albuterol (PROAIR HFA) 108 (90 BASE) MCG/ACT inhaler Inhale 2 puffs into the lungs every 6 (six) hours as needed.      [name] albuterol (PROVENTIL) (2.5 MG/3ML) 0.083% nebulizer solution Take 2.5 mg by nebulization every 6 (six) hours as needed for wheezing.      . Alpha-Lipoic Acid 200 MG TABS Take 1 tablet by mouth 2 (two) times daily.      [name] amLODipine (NORVASC) 5 MG tablet Take 1 tablet (5 mg total) by mouth daily.  90 tablet  1  . ferrous sulfate 325 (65 FE) MG tablet Take 325 mg by mouth daily.      [name] glipiZIDE (GLUCOTROL XL) 5 MG 24 hr tablet Take 5 mg by mouth daily.      [name] HYDROcodone-acetaminophen (NORCO) 7.5-325 MG per tablet Take 1 tablet by mouth every 8 (eight) hours as needed.       [name] losartan (COZAAR) 50 MG tablet TAKE 1 TABLET BY MOUTH TWICE DAILY EMERGENCY REFILL FAXED DR  60 tablet  6  . montelukast (SINGULAIR) 10 MG tablet Take 10 mg by mouth at bedtime.      . Omega-3 Fatty Acids (OMEGA 3 PO) Take 1 capsule by mouth daily.      . predniSONE (DELTASONE) 20  MG tablet Take 20 mg by mouth as needed.       . promethazine-dextromethorphan (PROMETHAZINE-DM) 6.25-15 MG/5ML syrup Take 5 mLs by mouth 4 (four) times daily as needed.       . ranitidine (ZANTAC) 300 MG tablet Take 300 mg by mouth at bedtime.      . sildenafil (VIAGRA) 100 MG tablet Take 1 tablet (100 mg total) by mouth daily as needed for erectile dysfunction.  10 tablet  1  . SYMBICORT 80-4.5 MCG/ACT inhaler Inhale 2 puffs into the lungs 2 (two) times daily.       . Tamsulosin HCl (FLOMAX) 0.4 MG CAPS Take 0.4 mg by mouth daily.      . vitamin E 200 UNIT capsule Take 400 Units by mouth daily.       No current facility-administered medications for this visit.    Past Medical History  Diagnosis Date  . Type 2 diabetes mellitus   . COPD (chronic obstructive pulmonary disease)   . Chronic renal insufficiency     Past Surgical History  Procedure Laterality Date  . Cholecystectomy    . Nephrectomy      RIGHT (Question CA.  No further therapy needed.)  . Nasal sinus surgery    . Appendectomy  ROS:  As stated in the HPI and negative for all other systems.   PHYSICAL EXAM BP 138/72  Pulse 85  Ht 5\' 6"  (1.676 m)  Wt 213 lb (96.616 kg)  BMI 34.40 kg/m2  SpO2 95% GENERAL:  Well appearing HEENT:  Pupils equal round and reactive, fundi not visualized, oral mucosa unremarkable, upper dentures, poor dentition NECK:  No jugular venous distention, waveform within normal limits, carotid upstroke brisk and symmetric, no bruits, no thyromegaly LYMPHATICS:  No cervical, inguinal adenopathy LUNGS:  Diffuse coarse scattered rhonchi, no fine crackles, no wheezing, decreased breath sounds BACK:  No CVA tenderness CHEST:  Unremarkable HEART:  PMI not displaced or sustained,S1 and S2 within normal limits, no S3, no S4, no clicks, no rubs, no murmurs ABD:  Flat, positive bowel sounds normal in frequency in pitch, no bruits, no rebound, no guarding, no midline pulsatile mass, no hepatomegaly,  no splenomegaly EXT:  2 plus pulses throughout, no edema, no cyanosis no clubbing   ASSESSMENT AND PLAN    Cardiomyopathy secondary -  He does not appear to be volume overloaded on this appointment.  I will continue the meds as listed.  Chronic renal insufficiency -  He is having this followed by [facility] A, MD  Dyspnea/cough - This is his predominant complaint. As above I suspect that this is predominantly pulmonary.  On the outside chance this may be exacerbated by reflux I will switch him to Prilosec and stop Zantac. He would like another opinion on his lung disease and I will get him in an appointment with a pulmonologist in [place].  HTN -  This is being managed in the context of treating his CHF. He will continue on the meds as listed.

## 2013-10-23 NOTE — Patient Instructions (Signed)
Your physician recommends that you schedule a follow-up appointment in: 1 year. You will receive a reminder letter in the mail in about 10 months reminding you to call and schedule your appointment. If you don't receive this letter, please contact our office. Your physician has recommended you make the following change in your medication:  Stop zantac. Start prilosec 20 mg daily. You have been referred to a Pulmonologist in [place]. You will be contacted with this appointment.

## 2013-11-08 ENCOUNTER — Ambulatory Visit (INDEPENDENT_AMBULATORY_CARE_PROVIDER_SITE_OTHER): Payer: 59 | Admitting: Internal Medicine

## 2013-11-08 ENCOUNTER — Encounter: Payer: Self-pay | Admitting: Internal Medicine

## 2013-11-08 VITALS — BP 140/80 | HR 80 | Ht 69.0 in | Wt 215.0 lb

## 2013-11-08 DIAGNOSIS — R0989 Other specified symptoms and signs involving the circulatory and respiratory systems: Secondary | ICD-10-CM

## 2013-11-08 DIAGNOSIS — R0609 Other forms of dyspnea: Secondary | ICD-10-CM

## 2013-11-08 NOTE — Progress Notes (Signed)
Subjective:    Patient ID: Gregory Mitchell, male    DOB: 23-May-1941, 73 y.o.   MRN: [id number] IOV 11/08/2013 HASANAJ,XAJE A, MD is PCP   HPI  IOV 11/08/2013  Chief Complaint  Patient presents with  . Pulmonary Consult    for SOB at rest and activity. pt is also having chest pain at times.     72 year old [place] American gentleman with a previous years smoking. Quit smoking 37 years ago. His chronic systolic heart failure with an ejection fraction of 45% followed by Dr. Minus Breeding in cardiology. Referred for cough and dyspnea  Cough is the worse symptom. Present for a few years  Slightly progressive. Associated with sputum production that is at least white to yellow in color chronic daily basis. Total volume of sputum in any given day is around 1 small cup. Cough is associated with omega-3 fish oil intake. There is also associated sinus drainage. Cough is moderate in intensity.  Dyspnea as a next worse symptom. Present for a few years. Progressive. Insidious in onset. Moderate in severity. Does not feel dyspneic for changing clothes but will get dyspneic for climbing a flight of stairs. This was also made worse after a heavy meal at which time he also feels chest tightness. This is relieved after food digestion and also using albuterol nebulizer for rescue. Dyspnea is also relieved by resting   There is no associated chest pain, orthopnea, paroxysmal nocturnal dyspnea. ECHO 04/12/13  - LVEF 45, global hypokinesis. Normal PAP   CXR 08/23/12  -  Outside report: probable emphysema   Smoker  -  reports that he quit smoking about 37 years ago. His smoking use included Cigarettes. He has a 15 pack-year smoking history. He quit smokeless tobacco use about 7 years ago. His smokeless tobacco use included Chew.   Past Medical History  Diagnosis Date  . Type 2 diabetes mellitus   . COPD (chronic obstructive pulmonary disease)   . Chronic renal insufficiency      Family History   Problem Relation Age of Onset  . Lung cancer Brother   . Stroke Mother   . Breast cancer Mother      History   Social History  . Marital Status: Married    Spouse Name: N/A    Number of Children: N/A  . Years of Education: N/A   Occupational History  . Not on file.   Social History Main Topics  . Smoking status: Former Smoker -- 1.00 packs/day for 15 years    Types: Cigarettes    Quit date: 08/09/1976  . Smokeless tobacco: Former Systems developer    Types: Chew    Quit date: 02/10/2006     Comment: chewed tobacco for 10 years  . Alcohol Use: Not on file  . Drug Use: Not on file  . Sexual Activity: Not on file   Other Topics Concern  . Not on file   Social History Narrative   Lives with wife.  Three children.       Allergies  Allergen Reactions  . Amoxicillin     Makes eyes turn yellow and burn  . Ciprofloxacin     Pt not sure  . Sulfa Antibiotics     Pt not sure of reaction     Outpatient Prescriptions Prior to Visit  Medication Sig Dispense Refill  . albuterol (PROAIR HFA) 108 (90 BASE) MCG/ACT inhaler Inhale 2 puffs into the lungs every 6 (six) hours as needed.      [name]  Alpha-Lipoic Acid 200 MG TABS Take 1 tablet by mouth 2 (two) times daily.      [name] amLODipine (NORVASC) 5 MG tablet Take 1 tablet (5 mg total) by mouth daily.  90 tablet  1  . glipiZIDE (GLUCOTROL XL) 5 MG 24 hr tablet Take 5 mg by mouth daily.      [name] HYDROcodone-acetaminophen (NORCO) 7.5-325 MG per tablet Take 1 tablet by mouth every 8 (eight) hours as needed.       . montelukast (SINGULAIR) 10 MG tablet Take 10 mg by mouth at bedtime.      . Omega-3 Fatty Acids (OMEGA 3 PO) Take 1 capsule by mouth daily.      [name] omeprazole (PRILOSEC) 20 MG capsule Take 1 capsule (20 mg total) by mouth daily.  30 capsule  11  . Tamsulosin HCl (FLOMAX) 0.4 MG CAPS Take 0.4 mg by mouth daily.      . vitamin E 200 UNIT capsule Take 400 Units by mouth daily.      . ferrous sulfate 325 (65 FE) MG tablet Take 325 mg by mouth  daily.      [name] losartan (COZAAR) 50 MG tablet TAKE 1 TABLET BY MOUTH TWICE DAILY EMERGENCY REFILL FAXED DR  60 tablet  6  . sildenafil (VIAGRA) 100 MG tablet Take 1 tablet (100 mg total) by mouth daily as needed for erectile dysfunction.  10 tablet  1  . SYMBICORT 80-4.5 MCG/ACT inhaler Inhale 2 puffs into the lungs 2 (two) times daily.       [name] albuterol (PROVENTIL) (2.5 MG/3ML) 0.083% nebulizer solution Take 2.5 mg by nebulization every 6 (six) hours as needed for wheezing.      . predniSONE (DELTASONE) 20 MG tablet Take 20 mg by mouth as needed.       . promethazine-dextromethorphan (PROMETHAZINE-DM) 6.25-15 MG/5ML syrup Take 5 mLs by mouth 4 (four) times daily as needed.        No facility-administered medications prior to visit.       Review of Systems  Constitutional: Negative for fever and unexpected weight change.  HENT: Positive for congestion. Negative for dental problem, ear pain, nosebleeds, postnasal drip, rhinorrhea, sinus pressure, sneezing, sore throat and trouble swallowing.   Eyes: Negative for redness and itching.  Respiratory: Positive for shortness of breath. Negative for cough, chest tightness and wheezing.   Cardiovascular: Positive for chest pain. Negative for palpitations and leg swelling.  Gastrointestinal: Negative for nausea and vomiting.  Genitourinary: Negative for dysuria.  Musculoskeletal: Positive for arthralgias. Negative for joint swelling.  Skin: Negative for rash.  Neurological: Positive for headaches.  Hematological: Does not bruise/bleed easily.  Psychiatric/Behavioral: Negative for dysphoric mood. The patient is not nervous/anxious.        Objective:   Physical Exam  Nursing note and vitals reviewed. Constitutional: He is oriented to person, place, and time. He appears well-developed and well-nourished. No distress.  HENT:  Head: Normocephalic and atraumatic.  Right Ear: External ear normal.  Left Ear: External ear normal.  Mouth/Throat:  Oropharynx is clear and moist. No oropharyngeal exudate.  Eyes: Conjunctivae and EOM are normal. Pupils are equal, round, and reactive to light. Right eye exhibits no discharge. Left eye exhibits no discharge. No scleral icterus.  Neck: Normal range of motion. Neck supple. No JVD present. No tracheal deviation present. No thyromegaly present.  Cardiovascular: Normal rate, regular rhythm and intact distal pulses.  Exam reveals no gallop and no friction rub.   No murmur heard. Pulmonary/Chest:  Effort normal and breath sounds normal. No respiratory distress. He has no wheezes. He has no rales. He exhibits no tenderness.  Abdominal: Soft. Bowel sounds are normal. He exhibits no distension and no mass. There is no tenderness. There is no rebound and no guarding.  Musculoskeletal: Normal range of motion. He exhibits no edema and no tenderness.  Lymphadenopathy:    He has no cervical adenopathy.  Neurological: He is alert and oriented to person, place, and time. He has normal reflexes. No cranial nerve deficit. Coordination normal.  Skin: Skin is warm and dry. No rash noted. He is not diaphoretic. No erythema. No pallor.  Psychiatric: He has a normal mood and affect. His behavior is normal. Judgment and thought content normal.          Assessment & Plan:

## 2013-11-08 NOTE — Patient Instructions (Signed)
Cough and shortness of breath  - not sure if is copd, bronchiectasis or pulmonary fibrosis  - do PFT test  - do High Resolution CT chest without contrast on ILD protocol. Only  Dr Melinda Blietz or Dr. Daniel Entrikin to read   REturn to see me after above - less than 2-3 weeks after above 

## 2013-11-16 ENCOUNTER — Ambulatory Visit (INDEPENDENT_AMBULATORY_CARE_PROVIDER_SITE_OTHER)
Admission: RE | Admit: 2013-11-16 | Discharge: 2013-11-16 | Disposition: A | Discharge status: Home / Self Care | Payer: 59 | Source: Ambulatory Visit | Attending: Internal Medicine | Admitting: Internal Medicine

## 2013-11-16 ENCOUNTER — Ambulatory Visit (HOSPITAL_COMMUNITY)
Admission: RE | Admit: 2013-11-16 | Discharge: 2013-11-16 | Disposition: A | Discharge status: Home / Self Care | Payer: Medicare HMO | Source: Ambulatory Visit | Attending: Internal Medicine | Admitting: Internal Medicine

## 2013-11-16 DIAGNOSIS — R0609 Other forms of dyspnea: Secondary | ICD-10-CM | POA: Insufficient documentation

## 2013-11-16 DIAGNOSIS — R0989 Other specified symptoms and signs involving the circulatory and respiratory systems: Principal | ICD-10-CM | POA: Insufficient documentation

## 2013-11-16 LAB — PULMONARY FUNCTION TEST
FEF 25-75 PRE: 0.51 L/s
FEF 25-75 Post: 0.57 L/sec
FEF2575-%Change-Post: 11 %
FEF2575-%PRED-PRE: 22 %
FEF2575-%Pred-Post: 25 %
FEV1-%Change-Post: 1 %
FEV1-%Pred-Post: 38 %
FEV1-%Pred-Pre: 37 %
FEV1-Post: 1.04 L
FEV1-Pre: 1.02 L
FEV1FVC-%CHANGE-POST: -14 %
FEV1FVC-%Pred-Pre: 80 %
FEV6-%CHANGE-POST: 23 %
FEV6-%PRED-POST: 56 %
FEV6-%PRED-PRE: 45 %
FEV6-Post: 1.93 L
FEV6-Pre: 1.57 L
FEV6FVC-%CHANGE-POST: -2 %
FEV6FVC-%PRED-POST: 102 %
FEV6FVC-%Pred-Pre: 105 %
FVC-%Change-Post: 19 %
FVC-%PRED-POST: 54 %
FVC-%PRED-PRE: 45 %
FVC-POST: 1.98 L
FVC-PRE: 1.66 L
Post FEV1/FVC ratio: 52 %
Post FEV6/FVC ratio: 98 %
Pre FEV1/FVC ratio: 61 %
Pre FEV6/FVC Ratio: 100 %
RV % pred: 147 %
RV: 3.63 L
TLC % PRED: 84 %
TLC: 5.76 L

## 2013-11-16 MED ORDER — ALBUTEROL SULFATE (2.5 MG/3ML) 0.083% IN NEBU
2.5000 mg | INHALATION_SOLUTION | Freq: Once | RESPIRATORY_TRACT | Status: AC
  Administered 2013-11-16: 2.5 mg via RESPIRATORY_TRACT

## 2013-11-18 NOTE — Assessment & Plan Note (Signed)
Cough and shortness of breath  - not sure if is copd, bronchiectasis or pulmonary fibrosis  - do PFT test  - do High Resolution CT chest without contrast on ILD protocol. Only  Dr Lorin Picket or Dr. Vinnie Langton to read   REturn to see me after above - less than 2-3 weeks after above

## 2013-12-11 ENCOUNTER — Ambulatory Visit: Payer: 59 | Admitting: Internal Medicine

## 2013-12-12 ENCOUNTER — Ambulatory Visit: Payer: Medicare HMO | Admitting: Internal Medicine

## 2013-12-17 ENCOUNTER — Ambulatory Visit (INDEPENDENT_AMBULATORY_CARE_PROVIDER_SITE_OTHER): Payer: Medicare HMO | Admitting: Adult Health

## 2013-12-17 ENCOUNTER — Encounter: Payer: Self-pay | Admitting: Adult Health

## 2013-12-17 VITALS — BP 114/56 | HR 62 | Temp 98.2°F | Ht 66.0 in | Wt 210.4 lb

## 2013-12-17 DIAGNOSIS — J449 Chronic obstructive pulmonary disease, unspecified: Secondary | ICD-10-CM

## 2013-12-17 DIAGNOSIS — J479 Bronchiectasis, uncomplicated: Secondary | ICD-10-CM

## 2013-12-17 NOTE — Progress Notes (Signed)
Subjective:    Patient ID: Gregory Mitchell, male    DOB: 1941-01-25, 73 y.o.   MRN: [id number]  HPI  IOV 11/08/2013  Chief Complaint  Patient presents with  . Pulmonary Consult    for SOB at rest and activity. pt is also having chest pain at times.    73 year old [place] American gentleman with a previous years smoking. Quit smoking 37 years ago. His chronic systolic heart failure with an ejection fraction of 45% followed by Dr. Minus Breeding in cardiology. Referred for cough and dyspnea  Cough is the worse symptom. Present for a few years  Slightly progressive. Associated with sputum production that is at least white to yellow in color chronic daily basis. Total volume of sputum in any given day is around 1 small cup. Cough is associated with omega-3 fish oil intake. There is also associated sinus drainage. Cough is moderate in intensity.  Dyspnea as a next worse symptom. Present for a few years. Progressive. Insidious in onset. Moderate in severity. Does not feel dyspneic for changing clothes but will get dyspneic for climbing a flight of stairs. This was also made worse after a heavy meal at which time he also feels chest tightness. This is relieved after food digestion and also using albuterol nebulizer for rescue. Dyspnea is also relieved by resting   There is no associated chest pain, orthopnea, paroxysmal nocturnal dyspnea. ECHO 04/12/13  - LVEF 45, global hypokinesis. Normal PAP   CXR 08/23/12  -  Outside report: probable emphysema   Smoker  -  reports that he quit smoking about 37 years ago. His smoking use included Cigarettes. He has a 15 pack-year smoking history. He quit smokeless tobacco use about 7 years ago. His smokeless tobacco use included Chew.  12/21/2013  Does report some prod cough with yellow mucus, wheezing, increased SOB, tightness, decreased energy for 1 week. Denies f/c/s, hemoptysis, nausea, vomiting.  Finished Doxycycline by PCP 1 week ago.  Symptoms are some  better but not totally resolved   PFT 11/16/13  shows FEV1 37%, ratio 61 , no sign BD response Decreased FVC 45% (19% BD response) . DLCO was unable to be done.  Former smoker on Symbicort 80   CT chest 11/22/13 showed patchy areas of peribronchovascular nodularity , bronchiectasis. ? MAC   Review of Systems Constitutional:   No  weight loss, night sweats,  Fevers, chills, fatigue, or  lassitude.  HEENT:   No headaches,  Difficulty swallowing,  Tooth/dental problems, or  Sore throat,                No sneezing, itching, ear ache,  +nasal congestion, post nasal drip,   CV:  No chest pain,  Orthopnea, PND, swelling in lower extremities, anasarca, dizziness, palpitations, syncope.   GI  No heartburn, indigestion, abdominal pain, nausea, vomiting, diarrhea, change in bowel habits, loss of appetite, bloody stools.   Resp:   No chest wall deformity  Skin: no rash or lesions.  GU: no dysuria, change in color of urine, no urgency or frequency.  No flank pain, no hematuria   MS:  No joint pain or swelling.  No decreased range of motion.  No back pain.  Psych:  No change in mood or affect. No depression or anxiety.  No memory loss.         Objective:   Physical Exam GEN: A/Ox3; pleasant , NAD, elderly   HEENT:  [place]/AT,  EACs-clear, TMs-wnl, NOSE-clear, THROAT-clear, no lesions, no  postnasal drip or exudate noted.   NECK:  Supple w/ fair ROM; no JVD; normal carotid impulses w/o bruits; no thyromegaly or nodules palpated; no lymphadenopathy.  RESP  Few rhonchi, no accessory muscle use, no dullness to percussion  CARD:  RRR, no m/r/g  , no peripheral edema, pulses intact, no cyanosis or clubbing.  GI:   Soft & nt; nml bowel sounds; no organomegaly or masses detected.  Musco: Warm bil, no deformities or joint swelling noted.   Neuro: alert, no focal deficits noted.    Skin: Warm, no lesions or rashes    CT chest 11/22/13 >Image quality is degraded by respiratory motion. 2.  Patchy areas of peribronchovascular nodularity, bronchiectasis and bronchial wall thickening with minimal associated bronchiolectasis. Pattern is indicative of mycobacterium avium complex (MAC). Postinflammatory fibrosis is considered less likely.      Assessment & Plan:

## 2013-12-17 NOTE — Patient Instructions (Signed)
Increase Symbicort 160/4.6mcg 2 puffs Twice daily  , rinse after use.  Begin Spiriva 1 puff daily  Mucinex DM Twice daily  For congestion  Flutter valve Three times a day  For thick mucus.  Fluids and rest  follow up Dr. Chase Caller in 3-4 weeks  Please contact office for sooner follow up if symptoms do not improve or worsen or seek emergency care

## 2013-12-20 MED ORDER — FLUTTER DEVI: Status: DC

## 2013-12-21 DIAGNOSIS — J449 Chronic obstructive pulmonary disease, unspecified: Secondary | ICD-10-CM | POA: Insufficient documentation

## 2013-12-21 DIAGNOSIS — J479 Bronchiectasis, uncomplicated: Secondary | ICD-10-CM | POA: Insufficient documentation

## 2013-12-21 MED ORDER — BUDESONIDE-FORMOTEROL FUMARATE 160-4.5 MCG/ACT IN AERO: 2.0000 | INHALATION_SPRAY | Freq: Two times a day (BID) | RESPIRATORY_TRACT | Status: DC

## 2013-12-21 MED ORDER — TIOTROPIUM BROMIDE MONOHYDRATE 18 MCG IN CAPS: 18.0000 ug | ORAL_CAPSULE | Freq: Every day | RESPIRATORY_TRACT | Status: DC

## 2013-12-21 NOTE — Assessment & Plan Note (Signed)
Mixed Disease with severe airflow obstruction in former smoker with CT showing bronchiectasis .  Consider ambulatory walk on return to assess for desats and alpha 1   Plan  Increase Symbicort 160/4.72mcg 2 puffs Twice daily  , rinse after use.  Begin Spiriva 1 puff daily  Mucinex DM Twice daily  For congestion  Flutter valve Three times a day  For thick mucus.  Fluids and rest  follow up Dr. Chase Caller in 3-4 weeks  Please contact office for sooner follow up if symptoms do not improve or worsen or seek emergency care

## 2013-12-21 NOTE — Assessment & Plan Note (Signed)
CT shows Bronchiectasis  , ? MAC related changes  No active flare at present   Plan  Increase Symbicort 160/4.69mcg 2 puffs Twice daily  , rinse after use.  Mucinex DM Twice daily  For congestion  Flutter valve Three times a day  For thick mucus.  Fluids and rest  follow up Dr. Chase Caller in 3-4 weeks  Please contact office for sooner follow up if symptoms do not improve or worsen or seek emergency care

## 2014-01-07 ENCOUNTER — Ambulatory Visit: Payer: Medicare HMO | Admitting: Internal Medicine

## 2014-01-16 ENCOUNTER — Other Ambulatory Visit: Payer: Self-pay | Admitting: Physician Assistant

## 2014-07-08 ENCOUNTER — Other Ambulatory Visit: Payer: Self-pay | Admitting: Cardiology

## 2014-09-21 ENCOUNTER — Other Ambulatory Visit: Payer: Self-pay | Admitting: Cardiology

## 2014-10-09 ENCOUNTER — Ambulatory Visit: Payer: Medicare HMO | Admitting: Cardiology

## 2014-10-09 NOTE — Progress Notes (Unsigned)
Patient is a no show

## 2014-10-31 ENCOUNTER — Encounter: Payer: Self-pay | Admitting: Cardiology

## 2014-10-31 ENCOUNTER — Ambulatory Visit (INDEPENDENT_AMBULATORY_CARE_PROVIDER_SITE_OTHER): Payer: Medicare FFS | Admitting: Cardiology

## 2014-10-31 VITALS — BP 138/72 | HR 74 | Ht 66.0 in | Wt 197.8 lb

## 2014-10-31 DIAGNOSIS — I5022 Chronic systolic (congestive) heart failure: Secondary | ICD-10-CM

## 2014-10-31 DIAGNOSIS — R079 Chest pain, unspecified: Secondary | ICD-10-CM | POA: Diagnosis not present

## 2014-10-31 DIAGNOSIS — I10 Essential (primary) hypertension: Secondary | ICD-10-CM

## 2014-10-31 MED ORDER — ASPIRIN EC 81 MG PO TBEC: 81.0000 mg | DELAYED_RELEASE_TABLET | Freq: Every day | ORAL | Status: DC

## 2014-10-31 NOTE — Patient Instructions (Addendum)
   Begin Aspirin 81mg  daily Continue all other medications.   Patient to go to [facility] ED to be evaluated for chest pain.   Follow up in  1 month

## 2014-10-31 NOTE — Progress Notes (Signed)
Clinical Summary Mr. Belton is a 74 y.o.male last seen by Dr Percival Spanish, this is our first visit together. He is seen for the following medical problems.  1. Chronic systolic HF - LVEF previously as low as 35-40%, repeat echo showed improvement to 45-50% in 04/2013. He has had a prior cath 11/2011 that showed non-obstructive disease - No LE edema, no orthopnea, no PND.   2. HTN - compliant with meds  3. Chest pain - started approximately 2-3 weeks ago. Midchest, pressure like pain. 5/10 in severity. Happens at rest or with exertion. Lasts just a few seconds. Feels fatigued, +SOB. Nothing makes pain worst. Occurs daily. Typically worst with eating.   4. COPD - former tobacco - better w/ nebs - increased cough with yellow phlegm over the last few days   Past Medical History  Diagnosis Date  . Type 2 diabetes mellitus   . COPD (chronic obstructive pulmonary disease)   . Chronic renal insufficiency      Allergies  Allergen Reactions  . Amoxicillin     Makes eyes turn yellow and burn  . Ciprofloxacin     Pt not sure  . Sulfa Antibiotics     Pt not sure of reaction     Current Outpatient Prescriptions  Medication Sig Dispense Refill  . albuterol (PROAIR HFA) 108 (90 BASE) MCG/ACT inhaler Inhale 2 puffs into the lungs every 6 (six) hours as needed.    . Alpha-Lipoic Acid 200 MG TABS Take 1 tablet by mouth 2 (two) times daily.    [name] amLODipine (NORVASC) 5 MG tablet Take 1 tablet (5 mg total) by mouth daily. 90 tablet 1  . amLODipine (NORVASC) 5 MG tablet TAKE 1 TABLET BY MOUTH EVERY DAY 30 tablet 3  . budesonide-formoterol (SYMBICORT) 160-4.5 MCG/ACT inhaler Inhale 2 puffs into the lungs 2 (two) times daily. 1 Inhaler 12  . glipiZIDE (GLUCOTROL XL) 5 MG 24 hr tablet Take 5 mg by mouth daily.    [name] HYDROcodone-acetaminophen (NORCO) 7.5-325 MG per tablet Take 1 tablet by mouth every 8 (eight) hours as needed.     [name] ipratropium-albuterol (DUONEB) 0.5-2.5 (3) MG/3ML SOLN Take  3 mLs by nebulization every 6 (six) hours as needed.    [name] losartan (COZAAR) 50 MG tablet TAKE 1 TABLET BY MOUTH  DAILY EMERGENCY REFILL FAXED DR    . losartan (COZAAR) 50 MG tablet TAKE 1 TABLET BY MOUTH TWICE DAILY 60 tablet 6  . montelukast (SINGULAIR) 10 MG tablet Take 10 mg by mouth at bedtime.    . Omega-3 Fatty Acids (OMEGA 3 PO) Take 1 capsule by mouth daily.    [name] omeprazole (PRILOSEC) 20 MG capsule Take 1 capsule (20 mg total) by mouth daily. 30 capsule 11  . Respiratory Therapy Supplies (FLUTTER) DEVI Use as directed. 1 each 0  . sildenafil (VIAGRA) 100 MG tablet Take 1 tablet (100 mg total) by mouth daily as needed for erectile dysfunction. 10 tablet 1  . Tamsulosin HCl (FLOMAX) 0.4 MG CAPS Take 0.4 mg by mouth daily.    [name] tiotropium (SPIRIVA HANDIHALER) 18 MCG inhalation capsule Place 1 capsule (18 mcg total) into inhaler and inhale daily. 30 capsule 2  . vitamin E 200 UNIT capsule Take 400 Units by mouth daily.     No current facility-administered medications for this visit.     Past Surgical History  Procedure Laterality Date  . Cholecystectomy    . Nephrectomy      RIGHT (Question CA.  No further therapy needed.)  . Nasal sinus surgery    . Appendectomy       Allergies  Allergen Reactions  . Amoxicillin     Makes eyes turn yellow and burn  . Ciprofloxacin     Pt not sure  . Sulfa Antibiotics     Pt not sure of reaction      Family History  Problem Relation Age of Onset  . Lung cancer Brother   . Stroke Mother   . Breast cancer Mother      Social History Mr. Guess reports that he quit smoking about 38 years ago. His smoking use included Cigarettes. He has a 15 pack-year smoking history. He quit smokeless tobacco use about 8 years ago. His smokeless tobacco use included Chew. Mr. Gage has no alcohol history on file.   Review of Systems CONSTITUTIONAL: No weight loss, fever, chills, weakness or fatigue.  HEENT: Eyes: No visual loss, blurred  vision, double vision or yellow sclerae.No hearing loss, sneezing, congestion, runny nose or sore throat.  SKIN: No rash or itching.  CARDIOVASCULAR: per HPI RESPIRATORY: per HPI GASTROINTESTINAL: No anorexia, nausea, vomiting or diarrhea. No abdominal pain or blood.  GENITOURINARY: No burning on urination, no polyuria NEUROLOGICAL: No headache, dizziness, syncope, paralysis, ataxia, numbness or tingling in the extremities. No change in bowel or bladder control.  MUSCULOSKELETAL: No muscle, back pain, joint pain or stiffness.  LYMPHATICS: No enlarged nodes. No history of splenectomy.  PSYCHIATRIC: No history of depression or anxiety.  ENDOCRINOLOGIC: No reports of sweating, cold or heat intolerance. No polyuria or polydipsia.  [name]   Physical Examination p 74 bp 138/72 Wt 197 lbs BMI 32 Gen: resting comfortably, no acute distress HEENT: no scleral icterus, pupils equal round and reactive, no palptable cervical adenopathy,  CV: RRR, no m/r/g, no JVD, no carotid bruits Resp: Clear to auscultation bilaterally GI: abdomen is soft, non-tender, non-distended, normal bowel sounds, no hepatosplenomegaly MSK: extremities are warm, no edema.  Skin: warm, no rash Neuro:  no focal deficits Psych: appropriate affect   Diagnostic Studies  04/2013 echo Study Conclusions  - Left ventricle: The cavity size was mildly dilated. Wall thickness was increased in a pattern of mild to moderateLVH. Systolic function was mildly reduced. The estimated ejection fraction was in the range of 45% to 50%. There is mild global hypokinesis. There was an increased relative contribution of atrial contraction to ventricular filling. Doppler parameters are consistent with abnormal left ventricular relaxation (grade 1 diastolic dysfunction). Doppler parameters are consistent with high ventricular filling pressure. - Mitral valve: Calcified annulus. Mildly thickened leaflets . Trivial to mild  regurgitation. - Left atrium: The atrium was mildly dilated. - Right ventricle: The cavity size was mildly dilated. Wall thickness was normal. Systolic function was mildly reduced. - Right atrium: The atrium was mildly dilated. - Pericardium, extracardiac: A trivial pericardial effusion was identified.   11/2011 [facility] Procedural Findings:  Hemodynamics:  AO 157/77 LV 154/22  Coronary angiography:  Coronary dominance: Right  Left mainstem: Normal  Left anterior descending (LAD): Large vessel wrapping the apex. Mild lumina irregularities. D1 small normal. D2 small normal.  Left circumflex (LCx): AV group luminal irregularities. OM1 large and normal. PL x 2 small normal  Right coronary artery (RCA): Dominant. Long mid 25%. PDA normal. PL moderate and normal.   Left ventriculography: Left ventricle not injected  Final Conclusions: Mild coronary plaque. Non ischemic cardiomyopathy  Recommendations: Medical management.    Clinic EKG: SR, TWI inferior and  lateral precoridal leads   Assessment and Plan  1. Chronic systolic heart failure - no current symptoms, continue current meds  2. HTN - at goal, continue current meds  3. Chest pain - somewhat atypical symptoms. He does have new TWIs in the inferior leads on EKG concerning for possible ischemia. Last episode of pain this AM. Will send to ER for further evaluation.     F/u 1 month. Request labs from pcp   Arnoldo Lenis, M.D.

## 2014-11-01 ENCOUNTER — Encounter: Payer: Self-pay | Admitting: *Deleted

## 2014-11-28 ENCOUNTER — Encounter: Payer: Self-pay | Admitting: *Deleted

## 2014-11-29 ENCOUNTER — Encounter: Payer: Self-pay | Admitting: *Deleted

## 2014-11-29 ENCOUNTER — Ambulatory Visit (INDEPENDENT_AMBULATORY_CARE_PROVIDER_SITE_OTHER): Payer: Medicare FFS | Admitting: Cardiology

## 2014-11-29 ENCOUNTER — Telehealth: Payer: Self-pay | Admitting: Cardiology

## 2014-11-29 VITALS — BP 155/82 | HR 97 | Ht 66.0 in | Wt 196.0 lb

## 2014-11-29 DIAGNOSIS — J449 Chronic obstructive pulmonary disease, unspecified: Secondary | ICD-10-CM | POA: Diagnosis not present

## 2014-11-29 DIAGNOSIS — I10 Essential (primary) hypertension: Secondary | ICD-10-CM | POA: Diagnosis not present

## 2014-11-29 DIAGNOSIS — R079 Chest pain, unspecified: Secondary | ICD-10-CM | POA: Diagnosis not present

## 2014-11-29 MED ORDER — PREDNISONE 20 MG PO TABS: 40.0000 mg | ORAL_TABLET | Freq: Every day | ORAL | Status: DC

## 2014-11-29 NOTE — Patient Instructions (Signed)
   Begin Prednisone 40mg  daily x 5 days. Continue all other medications.   Your physician has requested that you have en exercise stress myoview. For further information please visit [email or web address]. Please follow instruction sheet, as given. Office will contact with results via phone or letter.   Follow up in  3 months

## 2014-11-29 NOTE — Telephone Encounter (Signed)
Exercise cardiolite - chest pain  Scheduled at [facility] 4/28 arrive at 8:15

## 2014-11-29 NOTE — Progress Notes (Signed)
Clinical Summary Gregory Mitchell is a 74 y.o.male seen today for follow up of the following medical problems. This is a focused visit on recent symptoms of chest pain. For more detailed clinical history refer to prior clinic notes.   1. Chest pain - started approximately 2-3 weeks ago. Midchest, pressure like pain. 5/10 in severity. Happens at rest or with exertion. Lasts just a few seconds. Feels fatigued, +SOB. Nothing makes pain worst. Occurs daily. Typically worst with eating.  - last visit sent to ER last visit due to chest pain and new EKG changes. CT PE negative, trop neg x 1. Treated in ER with nebs and steroids with improvement, he refused admission.   - still with some occasional chest pain. Dull pain bilateral rib cage, not positional. Occurs at rest.   2. COPD - recent course of abx and steroids after ER visit  Past Medical History  Diagnosis Date  . Type 2 diabetes mellitus   . COPD (chronic obstructive pulmonary disease)   . Chronic renal insufficiency      Allergies  Allergen Reactions  . Amoxicillin     Makes eyes turn yellow and burn  . Ciprofloxacin     Pt not sure  . Sulfa Antibiotics     Pt not sure of reaction     Current Outpatient Prescriptions  Medication Sig Dispense Refill  . albuterol (PROAIR HFA) 108 (90 BASE) MCG/ACT inhaler Inhale 2 puffs into the lungs every 6 (six) hours as needed.    . Alpha-Lipoic Acid 200 MG TABS Take 1 tablet by mouth daily.     [name] amLODipine (NORVASC) 5 MG tablet Take 1 tablet (5 mg total) by mouth daily. 90 tablet 1  . aspirin EC 81 MG tablet Take 1 tablet (81 mg total) by mouth daily.    [name] glipiZIDE (GLUCOTROL XL) 5 MG 24 hr tablet Take 5 mg by mouth daily.    [name] ipratropium-albuterol (DUONEB) 0.5-2.5 (3) MG/3ML SOLN Take 3 mLs by nebulization every 6 (six) hours as needed.    [name] losartan (COZAAR) 50 MG tablet TAKE 1 TABLET BY MOUTH TWICE DAILY 60 tablet 6  . metFORMIN (GLUCOPHAGE) 500 MG tablet Take 500 mg by mouth 2  (two) times daily.    . montelukast (SINGULAIR) 10 MG tablet Take 10 mg by mouth at bedtime.    [name] omeprazole (PRILOSEC) 20 MG capsule Take 1 capsule (20 mg total) by mouth daily. 30 capsule 11  . sildenafil (VIAGRA) 100 MG tablet Take 1 tablet (100 mg total) by mouth daily as needed for erectile dysfunction. 10 tablet 1  . Tamsulosin HCl (FLOMAX) 0.4 MG CAPS Take 0.4 mg by mouth daily.    . vitamin E 200 UNIT capsule Take 400 Units by mouth daily.     No current facility-administered medications for this visit.     Past Surgical History  Procedure Laterality Date  . Cholecystectomy    . Nephrectomy      RIGHT (Question CA.  No further therapy needed.)  . Nasal sinus surgery    . Appendectomy       Allergies  Allergen Reactions  . Amoxicillin     Makes eyes turn yellow and burn  . Ciprofloxacin     Pt not sure  . Sulfa Antibiotics     Pt not sure of reaction      Family History  Problem Relation Age of Onset  . Lung cancer Brother   . Stroke Mother   .  Breast cancer Mother      Social History Mr. Cranshaw reports that he quit smoking about 38 years ago. His smoking use included Cigarettes. He has a 15 pack-year smoking history. He quit smokeless tobacco use about 8 years ago. His smokeless tobacco use included Chew. Mr. Trudo has no alcohol history on file.   Review of Systems CONSTITUTIONAL: No weight loss, fever, chills, weakness or fatigue.  HEENT: Eyes: No visual loss, blurred vision, double vision or yellow sclerae.No hearing loss, sneezing, congestion, runny nose or sore throat.  SKIN: No rash or itching.  CARDIOVASCULAR: per HPI RESPIRATORY: positive for  shortness of breath, cough and sputum.  GASTROINTESTINAL: No anorexia, nausea, vomiting or diarrhea. No abdominal pain or blood.  GENITOURINARY: No burning on urination, no polyuria NEUROLOGICAL: No headache, dizziness, syncope, paralysis, ataxia, numbness or tingling in the extremities. No change in  bowel or bladder control.  MUSCULOSKELETAL: No muscle, back pain, joint pain or stiffness.  LYMPHATICS: No enlarged nodes. No history of splenectomy.  PSYCHIATRIC: No history of depression or anxiety.  ENDOCRINOLOGIC: No reports of sweating, cold or heat intolerance. No polyuria or polydipsia.  [name]   Physical Examination p 97 bp 155/82 Wt 196 lbs BMI 32 Gen: resting comfortably, no acute distress HEENT: no scleral icterus, pupils equal round and reactive, no palptable cervical adenopathy,  CV: RRR, no m/r/g, no JVD, no carotid bruits Resp: bilateral wheezing GI: abdomen is soft, non-tender, non-distended, normal bowel sounds, no hepatosplenomegaly MSK: extremities are warm, no edema.  Skin: warm, no rash Neuro:  no focal deficits Psych: appropriate affect   Diagnostic Studies 04/2013 echo Study Conclusions  - Left ventricle: The cavity size was mildly dilated. Wall thickness was increased in a pattern of mild to moderateLVH. Systolic function was mildly reduced. The estimated ejection fraction was in the range of 45% to 50%. There is mild global hypokinesis. There was an increased relative contribution of atrial contraction to ventricular filling. Doppler parameters are consistent with abnormal left ventricular relaxation (grade 1 diastolic dysfunction). Doppler parameters are consistent with high ventricular filling pressure. - Mitral valve: Calcified annulus. Mildly thickened leaflets . Trivial to mild regurgitation. - Left atrium: The atrium was mildly dilated. - Right ventricle: The cavity size was mildly dilated. Wall thickness was normal. Systolic function was mildly reduced. - Right atrium: The atrium was mildly dilated. - Pericardium, extracardiac: A trivial pericardial effusion was identified.   11/2011 [facility] Procedural Findings:  Hemodynamics:  AO  157/77 LV 154/22  Coronary angiography:  Coronary dominance: Right  Left mainstem: Normal  Left anterior descending (LAD): Large vessel wrapping the apex. Mild lumina irregularities. D1 small normal. D2 small normal.  Left circumflex (LCx): AV group luminal irregularities. OM1 large and normal. PL x 2 small normal  Right coronary artery (RCA): Dominant. Long mid 25%. PDA normal. PL moderate and normal.   Left ventriculography: Left ventricle not injected  Final Conclusions: Mild coronary plaque. Non ischemic cardiomyopathy  Recommendations: Medical management.      Assessment and Plan   1. Chest pain - somewhat atypical symptoms, however patient is diabetic and at risk for atypical angina Abnormal EKG.  - will order exercise myoview to further evaluate  2. COPD - started on 2nd course of abx by urgent care. Still with some wheezing, will give 5 days of prednisone 40mg  daily   F/u 3 months   Arnoldo Lenis, M.D.

## 2014-12-01 ENCOUNTER — Encounter: Payer: Self-pay | Admitting: Cardiology

## 2014-12-02 NOTE — Telephone Encounter (Signed)
12/02/14 AUTH # [id number] VALID 12/05/14 - 01/02/15 Gregory Mitchell

## 2014-12-05 ENCOUNTER — Encounter (HOSPITAL_COMMUNITY): Admission: RE | Admit: 2014-12-05 | Payer: Medicare FFS | Source: Ambulatory Visit

## 2014-12-05 ENCOUNTER — Encounter (HOSPITAL_COMMUNITY)
Admission: RE | Admit: 2014-12-05 | Discharge: 2014-12-05 | Disposition: A | Discharge status: Home / Self Care | Payer: Medicare FFS | Source: Ambulatory Visit | Attending: Cardiology | Admitting: Cardiology

## 2014-12-05 ENCOUNTER — Encounter (HOSPITAL_COMMUNITY)
Admission: RE | Admit: 2014-12-05 | Discharge: 2014-12-05 | Disposition: A | Discharge status: Home / Self Care | Payer: Medicare FFS | Source: Ambulatory Visit

## 2014-12-05 ENCOUNTER — Encounter (HOSPITAL_COMMUNITY): Payer: Self-pay

## 2014-12-05 ENCOUNTER — Ambulatory Visit (HOSPITAL_COMMUNITY)
Admission: RE | Admit: 2014-12-05 | Discharge: 2014-12-05 | Disposition: A | Discharge status: Home / Self Care | Payer: Medicare FFS | Source: Ambulatory Visit | Attending: Cardiology | Admitting: Cardiology

## 2014-12-05 DIAGNOSIS — R079 Chest pain, unspecified: Secondary | ICD-10-CM | POA: Insufficient documentation

## 2014-12-05 HISTORY — DX: Essential (primary) hypertension: I10

## 2014-12-05 HISTORY — DX: Unspecified asthma, uncomplicated: J45.909

## 2014-12-05 HISTORY — DX: Disorder of kidney and ureter, unspecified: N28.9

## 2014-12-05 MED ORDER — REGADENOSON 0.4 MG/5ML IV SOLN
INTRAVENOUS | Status: AC
  Filled 2014-12-05: qty 5

## 2014-12-05 MED ORDER — TECHNETIUM TC 99M SESTAMIBI - CARDIOLITE: 30.0000 | Freq: Once | INTRAVENOUS | Status: AC | PRN

## 2014-12-05 MED ORDER — TECHNETIUM TC 99M SESTAMIBI GENERIC - CARDIOLITE
10.0000 | Freq: Once | INTRAVENOUS | Status: AC | PRN
  Administered 2014-12-05: 10 via INTRAVENOUS

## 2014-12-05 MED ORDER — SODIUM CHLORIDE 0.9 % IJ SOLN
INTRAMUSCULAR | Status: AC
  Filled 2014-12-05: qty 3

## 2014-12-06 ENCOUNTER — Ambulatory Visit (HOSPITAL_COMMUNITY): Admission: RE | Admit: 2014-12-06 | Payer: Medicare FFS | Source: Ambulatory Visit

## 2014-12-09 ENCOUNTER — Encounter (HOSPITAL_COMMUNITY): Payer: Medicare FFS

## 2014-12-09 ENCOUNTER — Inpatient Hospital Stay (HOSPITAL_COMMUNITY): Admission: RE | Admit: 2014-12-09 | Payer: Medicare FFS | Source: Ambulatory Visit

## 2014-12-09 ENCOUNTER — Ambulatory Visit (HOSPITAL_COMMUNITY): Payer: Medicare FFS

## 2014-12-11 ENCOUNTER — Ambulatory Visit (HOSPITAL_COMMUNITY)
Admission: RE | Admit: 2014-12-11 | Discharge: 2014-12-11 | Disposition: A | Discharge status: Home / Self Care | Payer: Medicare FFS | Source: Ambulatory Visit | Attending: Cardiology | Admitting: Cardiology

## 2014-12-11 ENCOUNTER — Encounter (HOSPITAL_COMMUNITY)
Admission: RE | Admit: 2014-12-11 | Discharge: 2014-12-11 | Disposition: A | Discharge status: Home / Self Care | Payer: Medicare FFS | Source: Ambulatory Visit | Attending: Cardiology | Admitting: Cardiology

## 2014-12-11 ENCOUNTER — Encounter (HOSPITAL_COMMUNITY): Payer: Self-pay

## 2014-12-11 DIAGNOSIS — R079 Chest pain, unspecified: Secondary | ICD-10-CM | POA: Diagnosis not present

## 2014-12-11 MED ORDER — SODIUM CHLORIDE 0.9 % IJ SOLN: 10.0000 mL | INTRAMUSCULAR | Status: DC | PRN

## 2014-12-11 MED ORDER — SODIUM CHLORIDE 0.9 % IJ SOLN
INTRAMUSCULAR | Status: AC
  Administered 2014-12-11: 10 mL
  Filled 2014-12-11: qty 3

## 2014-12-11 MED ORDER — DOBUTAMINE IN D5W 4-5 MG/ML-% IV SOLN
INTRAVENOUS | Status: AC
  Administered 2014-12-11: 10 ug/kg/min via INTRAVENOUS
  Filled 2014-12-11: qty 250

## 2014-12-11 MED ORDER — DOBUTAMINE IN D5W 4-5 MG/ML-% IV SOLN
10.0000 ug/kg/min | INTRAVENOUS | Status: DC
  Administered 2014-12-11: 10 ug/kg/min via INTRAVENOUS

## 2014-12-11 MED ORDER — TECHNETIUM TC 99M SESTAMIBI GENERIC - CARDIOLITE
30.0000 | Freq: Once | INTRAVENOUS | Status: AC | PRN
Start: 2014-12-11 — End: 2014-12-11
  Administered 2014-12-11: 30 via INTRAVENOUS

## 2014-12-13 LAB — MYOCARDIAL PERFUSION IMAGING
CSEPED: 12 min
Exercise duration (sec): 50 s
MPHR: 146 {beats}/min
Peak HR: 130 {beats}/min
Percent HR: 89 %
Rest HR: 72 {beats}/min

## 2014-12-16 ENCOUNTER — Telehealth: Payer: Self-pay | Admitting: *Deleted

## 2014-12-16 NOTE — Telephone Encounter (Signed)
Notes Recorded by Laurine Blazer, LPN on 10/13/1222 at 4:97 PM Patient & wife notified. Rescheduled OV from 02/13/14 to 01/27/2014 with Dr. Harl Bowie.  Notes Recorded by Arnoldo Lenis, MD on 12/16/2014 at 12:41 PM Can we change his f/u with me to June  J Branch MD Notes Recorded by Laurine Blazer, LPN on 12/09/49 at 10:21 PM His next OV is not until 02/14/2015. Is this okay?   Notes Recorded by Arnoldo Lenis, MD on 12/13/2014 at 12:19 PM Stress test overall looks good. There is some evidence of potentially an old blockage in one artery of the heart however no evidence of any new blockages, nothing that would cause his chest pain. Willl discuss in further detail at f/u

## 2015-01-23 ENCOUNTER — Other Ambulatory Visit: Payer: Self-pay | Admitting: Cardiology

## 2015-01-28 ENCOUNTER — Ambulatory Visit (INDEPENDENT_AMBULATORY_CARE_PROVIDER_SITE_OTHER): Payer: Medicare FFS | Admitting: Cardiology

## 2015-01-28 ENCOUNTER — Encounter: Payer: Self-pay | Admitting: *Deleted

## 2015-01-28 ENCOUNTER — Encounter: Payer: Self-pay | Admitting: Cardiology

## 2015-01-28 VITALS — BP 129/66 | HR 81 | Ht 66.0 in | Wt 202.0 lb

## 2015-01-28 DIAGNOSIS — R079 Chest pain, unspecified: Secondary | ICD-10-CM | POA: Diagnosis not present

## 2015-01-28 DIAGNOSIS — J449 Chronic obstructive pulmonary disease, unspecified: Secondary | ICD-10-CM

## 2015-01-28 MED ORDER — PREDNISONE 20 MG PO TABS: 40.0000 mg | ORAL_TABLET | Freq: Every day | ORAL | Status: DC

## 2015-01-28 NOTE — Progress Notes (Signed)
Clinical Summary Gregory Mitchell is a 74 y.o.male seen today for follow up of the following medical problems. This is a focused visit on recent chest pain, for more detailed history refer to previous notes.   1. Chest pain - patient seen initially for chest pain. Referred for stress MPI  - 12/2014 exercise MPI with inferior scar, no current ischemia.  - still with chest pain. Mainly occurs when wakes up. Pain in right and left chest, dull pain with some SOB. Uses nebulizer helps with breathing and pain. Worst with position. Pain starts 7AM, constant for several hours into the afternoon. Pain is not exertional. Significantly imporved with steroids in the past.    2. COPD - tobacco x 20 years, quit 40 years ago.  - recent course of abx and steroids after ER visit     Past Medical History  Diagnosis Date  . Type 2 diabetes mellitus   . COPD (chronic obstructive pulmonary disease)   . Chronic renal insufficiency   . Renal insufficiency   . Asthma   . Hypertension      Allergies  Allergen Reactions  . Amoxicillin     Makes eyes turn yellow and burn  . Ciprofloxacin     Pt not sure  . Sulfa Antibiotics     Pt not sure of reaction     Current Outpatient Prescriptions  Medication Sig Dispense Refill  . albuterol (PROAIR HFA) 108 (90 BASE) MCG/ACT inhaler Inhale 2 puffs into the lungs every 6 (six) hours as needed.    . Alpha-Lipoic Acid 200 MG TABS Take 1 tablet by mouth daily.     [name] amLODipine (NORVASC) 5 MG tablet Take 1 tablet (5 mg total) by mouth daily. 90 tablet 1  . amLODipine (NORVASC) 5 MG tablet TAKE 1 TABLET BY MOUTH EVERY DAY 30 tablet 3  . aspirin EC 81 MG tablet Take 1 tablet (81 mg total) by mouth daily.    [name] glipiZIDE (GLUCOTROL XL) 5 MG 24 hr tablet Take 5 mg by mouth daily.    [name] ipratropium-albuterol (DUONEB) 0.5-2.5 (3) MG/3ML SOLN Take 3 mLs by nebulization every 6 (six) hours as needed.    [name] losartan (COZAAR) 50 MG tablet TAKE 1 TABLET BY MOUTH TWICE  DAILY 60 tablet 6  . metFORMIN (GLUCOPHAGE) 500 MG tablet Take 500 mg by mouth 2 (two) times daily.    . montelukast (SINGULAIR) 10 MG tablet Take 10 mg by mouth at bedtime.    . Omega-3 Fatty Acids (FISH OIL) 1000 MG CAPS Take 1 capsule by mouth daily.    [name] omeprazole (PRILOSEC) 20 MG capsule Take 1 capsule (20 mg total) by mouth daily. 30 capsule 11  . predniSONE (DELTASONE) 20 MG tablet Take 2 tablets (40 mg total) by mouth daily. 10 tablet 0  . sildenafil (VIAGRA) 100 MG tablet Take 1 tablet (100 mg total) by mouth daily as needed for erectile dysfunction. 10 tablet 1  . Tamsulosin HCl (FLOMAX) 0.4 MG CAPS Take 0.4 mg by mouth daily.    . vitamin E 200 UNIT capsule Take 400 Units by mouth daily.     No current facility-administered medications for this visit.     Past Surgical History  Procedure Laterality Date  . Cholecystectomy    . Nephrectomy      RIGHT (Question CA.  No further therapy needed.)  . Nasal sinus surgery    . Appendectomy       Allergies  Allergen Reactions  .  Amoxicillin     Makes eyes turn yellow and burn  . Ciprofloxacin     Pt not sure  . Sulfa Antibiotics     Pt not sure of reaction      Family History  Problem Relation Age of Onset  . Lung cancer Brother   . Stroke Mother   . Breast cancer Mother      Social History Gregory Mitchell reports that he quit smoking about 38 years ago. His smoking use included Cigarettes. He has a 15 pack-year smoking history. He quit smokeless tobacco use about 8 years ago. His smokeless tobacco use included Chew. Gregory Mitchell has no alcohol history on file.   Review of Systems CONSTITUTIONAL: No weight loss, fever, chills, weakness or fatigue.  HEENT: Eyes: No visual loss, blurred vision, double vision or yellow sclerae.No hearing loss, sneezing, congestion, runny nose or sore throat.  SKIN: No rash or itching.  CARDIOVASCULAR: per HPI RESPIRATORY:+ cough, SOB GASTROINTESTINAL: No anorexia, nausea, vomiting  or diarrhea. No abdominal pain or blood.  GENITOURINARY: No burning on urination, no polyuria NEUROLOGICAL: No headache, dizziness, syncope, paralysis, ataxia, numbness or tingling in the extremities. No change in bowel or bladder control.  MUSCULOSKELETAL: No muscle, back pain, joint pain or stiffness.  LYMPHATICS: No enlarged nodes. No history of splenectomy.  PSYCHIATRIC: No history of depression or anxiety.  ENDOCRINOLOGIC: No reports of sweating, cold or heat intolerance. No polyuria or polydipsia.  [name]   Physical Examination Filed Vitals:   01/28/15 1040  BP: 129/66  Pulse: 81   Filed Vitals:   01/28/15 1040  Height: 5\' 6"  (1.676 m)  Weight: 202 lb (91.627 kg)    Gen: resting comfortably, no acute distress HEENT: no scleral icterus, pupils equal round and reactive, no palptable cervical adenopathy,  CV: RRR, no m/r/g, no JVD Resp: +wheezing bilaterally GI: abdomen is soft, non-tender, non-distended, normal bowel sounds, no hepatosplenomegaly MSK: extremities are warm, no edema.  Skin: warm, no rash Neuro:  no focal deficits Psych: appropriate affect   Diagnostic Studies 04/2013 echo Study Conclusions  - Left ventricle: The cavity size was mildly dilated. Wall thickness was increased in a pattern of mild to moderateLVH. Systolic function was mildly reduced. The estimated ejection fraction was in the range of 45% to 50%. There is mild global hypokinesis. There was an increased relative contribution of atrial contraction to ventricular filling. Doppler parameters are consistent with abnormal left ventricular relaxation (grade 1 diastolic dysfunction). Doppler parameters are consistent with high ventricular filling pressure. - Mitral valve: Calcified annulus. Mildly thickened leaflets . Trivial to mild regurgitation. - Left atrium: The atrium was mildly dilated. - Right ventricle: The cavity size was mildly dilated. Wall thickness was normal.  Systolic function was mildly reduced. - Right atrium: The atrium was mildly dilated. - Pericardium, extracardiac: A trivial pericardial effusion was identified.   11/2011 Cath Procedural Findings:  Hemodynamics:  AO 157/77 LV 154/22  Coronary angiography:  Coronary dominance: Right  Left mainstem: Normal  Left anterior descending (LAD): Large vessel wrapping the apex. Mild lumina irregularities. D1 small normal. D2 small normal.  Left circumflex (LCx): AV group luminal irregularities. OM1 large and normal. PL x 2 small normal  Right coronary artery (RCA): Dominant. Long mid 25%. PDA normal. PL moderate and normal.   Left ventriculography: Left ventricle not injected  Final Conclusions: Mild coronary plaque. Non ischemic cardiomyopathy  Recommendations: Medical management.     12/2014 Exercise Cardiolite IMPRESSION: 1. Moderate size region of inferior wall  myocardial scar. No ischemic territories seen. Overlying soft tissue attenuation artifact cannot entirely be ruled out.  2. Mild inferior wall hypokinesis.  3. Left ventricular ejection fraction 48%  4. Low to intermediate-risk stress test findings*.  Assessment and Plan  1. Chest pain - atypical symptoms, often improved with nebulizers and steroids - stress test without significant ischemia, continue risk factor modification  2. COPD - followed by Dr Amanda Pea and Dr Koleen Nimrod - active wheezing, will give 5 day course of prednisone 40mg  daily.      F/u 6 months   Arnoldo Lenis, M.D

## 2015-01-28 NOTE — Patient Instructions (Signed)
Your physician wants you to follow-up in: 6 months with DR. Branch You will receive a reminder letter in the mail two months in advance. If you don't receive a letter, please call our office to schedule the follow-up appointment.  Your physician has recommended you make the following change in your medication:   TAKE PREDNISONE 40 MG DAILY FOR 5 DAYS.  CONTINUE ALL OTHER MEDICATIONS AS DIRECTED  WE WILL REQUEST RECORDS FROM DR. HENDERSON  Thank you for choosing [facility]!!

## 2015-02-14 ENCOUNTER — Ambulatory Visit: Payer: Medicare FFS | Admitting: Cardiology

## 2015-06-07 ENCOUNTER — Other Ambulatory Visit: Payer: Self-pay | Admitting: Adult Health

## 2015-08-14 ENCOUNTER — Ambulatory Visit (INDEPENDENT_AMBULATORY_CARE_PROVIDER_SITE_OTHER): Payer: Medicare HMO | Admitting: Cardiology

## 2015-08-14 ENCOUNTER — Encounter: Payer: Self-pay | Admitting: *Deleted

## 2015-08-14 ENCOUNTER — Encounter: Payer: Self-pay | Admitting: Cardiology

## 2015-08-14 VITALS — BP 145/82 | HR 69 | Ht 66.0 in | Wt 204.0 lb

## 2015-08-14 DIAGNOSIS — J449 Chronic obstructive pulmonary disease, unspecified: Secondary | ICD-10-CM | POA: Diagnosis not present

## 2015-08-14 DIAGNOSIS — R079 Chest pain, unspecified: Secondary | ICD-10-CM | POA: Diagnosis not present

## 2015-08-14 DIAGNOSIS — I5022 Chronic systolic (congestive) heart failure: Secondary | ICD-10-CM | POA: Diagnosis not present

## 2015-08-14 MED ORDER — DOXYCYCLINE HYCLATE 100 MG PO CAPS
100.0000 mg | ORAL_CAPSULE | Freq: Two times a day (BID) | ORAL | Status: DC
Start: 2015-08-14 — End: 2016-05-05

## 2015-08-14 MED ORDER — SILDENAFIL CITRATE 100 MG PO TABS: 100.0000 mg | ORAL_TABLET | Freq: Every day | ORAL | Status: DC | PRN

## 2015-08-14 MED ORDER — PREDNISONE 20 MG PO TABS: 40.0000 mg | ORAL_TABLET | Freq: Every day | ORAL | Status: DC

## 2015-08-14 NOTE — Progress Notes (Signed)
Patient ID: Gregory Mitchell, male   DOB: Jan 01, 1941, 75 y.o.   MRN: [id number]     Clinical Summary Gregory Mitchell is a 75 y.o.male seen today for follow up of the following medical problems.   1. Chest pain - long history of atypical chest pain - cath 2013 with nonobstructive disease - 12/2014 exercise MPI with inferior scar, no current ischemia.    - still with chest pain and SOB at times. Mainly upon awaking in the morning, lasts several hours consistently better with nebulizers. Has improved also in the past with prednisone.    2. COPD - tobacco x 20 years, quit 40 years ago.  - followed by Dr Koleen Nimrod - recent productive cough with yellow phlegm x 7 days, increased SOB with some wheezing.    3. Chronic systolic HF - LVEF previously as low as 35-40%, repeat echo showed improvement to 45-50% in 04/2013. He has had a prior cath 11/2011 that showed non-obstructive disease - No recent LE edema, no orthopnea, no PND.   4. HTN - compliant with meds  Past Medical History  Diagnosis Date  . Type 2 diabetes mellitus   . COPD (chronic obstructive pulmonary disease)   . Chronic renal insufficiency   . Renal insufficiency   . Asthma   . Hypertension      Allergies  Allergen Reactions  . Amoxicillin     Makes eyes turn yellow and burn  . Ciprofloxacin     Pt not sure     Current Outpatient Prescriptions  Medication Sig Dispense Refill  . albuterol (PROAIR HFA) 108 (90 BASE) MCG/ACT inhaler Inhale 2 puffs into the lungs every 6 (six) hours as needed.    . Alpha-Lipoic Acid 200 MG TABS Take 1 tablet by mouth daily.     [name] amLODipine (NORVASC) 5 MG tablet TAKE 1 TABLET BY MOUTH EVERY DAY 30 tablet 3  . aspirin EC 81 MG tablet Take 1 tablet (81 mg total) by mouth daily.    [name] glipiZIDE (GLUCOTROL XL) 5 MG 24 hr tablet Take 5 mg by mouth daily.    [name] ipratropium-albuterol (DUONEB) 0.5-2.5 (3) MG/3ML SOLN Take 3 mLs by nebulization every 6 (six) hours as needed.    [name] losartan  (COZAAR) 50 MG tablet TAKE 1 TABLET BY MOUTH TWICE DAILY 60 tablet 6  . montelukast (SINGULAIR) 10 MG tablet Take 10 mg by mouth at bedtime.    . Omega-3 Fatty Acids (FISH OIL) 1000 MG CAPS Take 1 capsule by mouth daily.    [name] omeprazole (PRILOSEC) 20 MG capsule Take 1 capsule (20 mg total) by mouth daily. 30 capsule 11  . predniSONE (DELTASONE) 20 MG tablet Take 2 tablets (40 mg total) by mouth daily with breakfast. For 5 days *NEW MEDICATION 01/28/15 10 tablet 0  . sildenafil (VIAGRA) 100 MG tablet Take 1 tablet (100 mg total) by mouth daily as needed for erectile dysfunction. 10 tablet 1  . Tamsulosin HCl (FLOMAX) 0.4 MG CAPS Take 0.4 mg by mouth daily.    . vitamin E 200 UNIT capsule Take 400 Units by mouth daily.     No current facility-administered medications for this visit.     Past Surgical History  Procedure Laterality Date  . Cholecystectomy    . Nephrectomy      RIGHT (Question CA.  No further therapy needed.)  . Nasal sinus surgery    . Appendectomy       Allergies  Allergen Reactions  . Amoxicillin  Makes eyes turn yellow and burn  . Ciprofloxacin     Pt not sure      Family History  Problem Relation Age of Onset  . Lung cancer Brother   . Stroke Mother   . Breast cancer Mother      Social History Mr. Bearup reports that he quit smoking about 39 years ago. His smoking use included Cigarettes. He started smoking about 58 years ago. He has a 15 pack-year smoking history. He quit smokeless tobacco use about 9 years ago. His smokeless tobacco use included Chew. Mr. Vanleeuwen has no alcohol history on file.   Review of Systems CONSTITUTIONAL: No weight loss, fever, chills, weakness or fatigue.  HEENT: Eyes: No visual loss, blurred vision, double vision or yellow sclerae.No hearing loss, sneezing, congestion, runny nose or sore throat.  SKIN: No rash or itching.  CARDIOVASCULAR: per hpi RESPIRATORY: per hpi.  GASTROINTESTINAL: No anorexia, nausea,  vomiting or diarrhea. No abdominal pain or blood.  GENITOURINARY: No burning on urination, no polyuria NEUROLOGICAL: No headache, dizziness, syncope, paralysis, ataxia, numbness or tingling in the extremities. No change in bowel or bladder control.  MUSCULOSKELETAL: No muscle, back pain, joint pain or stiffness.  LYMPHATICS: No enlarged nodes. No history of splenectomy.  PSYCHIATRIC: No history of depression or anxiety.  ENDOCRINOLOGIC: No reports of sweating, cold or heat intolerance. No polyuria or polydipsia.  [name]   Physical Examination Filed Vitals:   08/14/15 1041  BP: 145/82  Pulse: 69   Filed Vitals:   08/14/15 1041  Height: 5\' 6"  (1.676 m)  Weight: 204 lb (92.534 kg)    Gen: resting comfortably, no acute distress HEENT: no scleral icterus, pupils equal round and reactive, no palptable cervical adenopathy,  CV: RRR, no m/r/g, no jvd Resp: mild bilateral expiraotory wheezing.  GI: abdomen is soft, non-tender, non-distended, normal bowel sounds, no hepatosplenomegaly MSK: extremities are warm, no edema.  Skin: warm, no rash Neuro:  no focal deficits Psych: appropriate affect   Diagnostic Studies 04/2013 echo Study Conclusions  - Left ventricle: The cavity size was mildly dilated. Wall thickness was increased in a pattern of mild to moderateLVH. Systolic function was mildly reduced. The estimated ejection fraction was in the range of 45% to 50%. There is mild global hypokinesis. There was an increased relative contribution of atrial contraction to ventricular filling. Doppler parameters are consistent with abnormal left ventricular relaxation (grade 1 diastolic dysfunction). Doppler parameters are consistent with high ventricular filling pressure. - Mitral valve: Calcified annulus. Mildly thickened leaflets . Trivial to mild regurgitation. - Left atrium: The atrium was mildly dilated. - Right ventricle: The cavity size was mildly dilated.  Wall thickness was normal. Systolic function was mildly reduced. - Right atrium: The atrium was mildly dilated. - Pericardium, extracardiac: A trivial pericardial effusion was identified.   11/2011 Cath Procedural Findings:  Hemodynamics:  AO 157/77 LV 154/22  Coronary angiography:  Coronary dominance: Right  Left mainstem: Normal  Left anterior descending (LAD): Large vessel wrapping the apex. Mild lumina irregularities. D1 small normal. D2 small normal.  Left circumflex (LCx): AV group luminal irregularities. OM1 large and normal. PL x 2 small normal  Right coronary artery (RCA): Dominant. Long mid 25%. PDA normal. PL moderate and normal.   Left ventriculography: Left ventricle not injected  Final Conclusions: Mild coronary plaque. Non ischemic cardiomyopathy  Recommendations: Medical management.     12/2014 Exercise Cardiolite IMPRESSION: 1. Moderate size region of inferior wall myocardial scar. No ischemic territories seen. Overlying  soft tissue attenuation artifact cannot entirely be ruled out.  2. Mild inferior wall hypokinesis.  3. Left ventricular ejection fraction 48%  4. Low to intermediate-risk stress test findings*.    Assessment and Plan   1. Chest pain - atypical symptoms, have improved with nebulizers and steroids - cath 2013 with nonobstructive disease. 12/2014 stress test without significant ischemia - continue risk factor modificaiton  2. COPD - followed by Dr Amanda Pea and Dr Koleen Nimrod - active wheezing and SOB, will give 5 day course of prednisone 40mg  daily along with doxycycline 100mg  bid x 7 days for productive cough  3. Chronic systolic heart failure - no current symptoms, continue current meds  4. HTN - at goal, continue current meds  5. Erectile dysfunction - will refill his viagra.   F/u 1  year   Arnoldo Lenis, M.D., F.A.C.C.

## 2015-08-14 NOTE — Patient Instructions (Addendum)
   Doxycycline 100mg  twice a day x 7 days.   Prednisone 40mg  daily x 5 days.   Viagra 100mg  as needed   All prescriptions above sent to [facility] today. Continue all other medications.   Your physician wants you to follow up in:  1 year.  You will receive a reminder letter in the mail one-two months in advance.  If you don't receive a letter, please call our office to schedule the follow up appointment

## 2015-08-15 DIAGNOSIS — J449 Chronic obstructive pulmonary disease, unspecified: Secondary | ICD-10-CM | POA: Diagnosis not present

## 2015-08-19 DIAGNOSIS — R300 Dysuria: Secondary | ICD-10-CM | POA: Diagnosis not present

## 2015-08-19 DIAGNOSIS — J069 Acute upper respiratory infection, unspecified: Secondary | ICD-10-CM | POA: Diagnosis not present

## 2015-08-19 DIAGNOSIS — J9801 Acute bronchospasm: Secondary | ICD-10-CM | POA: Diagnosis not present

## 2015-08-20 DIAGNOSIS — J449 Chronic obstructive pulmonary disease, unspecified: Secondary | ICD-10-CM | POA: Diagnosis not present

## 2015-08-28 DIAGNOSIS — J440 Chronic obstructive pulmonary disease with acute lower respiratory infection: Secondary | ICD-10-CM | POA: Diagnosis not present

## 2015-08-28 DIAGNOSIS — E1165 Type 2 diabetes mellitus with hyperglycemia: Secondary | ICD-10-CM | POA: Diagnosis not present

## 2015-08-28 DIAGNOSIS — Z6833 Body mass index (BMI) 33.0-33.9, adult: Secondary | ICD-10-CM | POA: Diagnosis not present

## 2015-08-28 DIAGNOSIS — J449 Chronic obstructive pulmonary disease, unspecified: Secondary | ICD-10-CM | POA: Diagnosis not present

## 2015-09-04 DIAGNOSIS — Z9981 Dependence on supplemental oxygen: Secondary | ICD-10-CM | POA: Diagnosis not present

## 2015-09-04 DIAGNOSIS — J441 Chronic obstructive pulmonary disease with (acute) exacerbation: Secondary | ICD-10-CM | POA: Diagnosis not present

## 2015-09-04 DIAGNOSIS — J40 Bronchitis, not specified as acute or chronic: Secondary | ICD-10-CM | POA: Diagnosis not present

## 2015-09-09 ENCOUNTER — Other Ambulatory Visit: Payer: Self-pay | Admitting: Cardiology

## 2015-09-09 DIAGNOSIS — R5382 Chronic fatigue, unspecified: Secondary | ICD-10-CM | POA: Diagnosis not present

## 2015-09-09 DIAGNOSIS — R69 Illness, unspecified: Secondary | ICD-10-CM | POA: Diagnosis not present

## 2015-09-09 DIAGNOSIS — E669 Obesity, unspecified: Secondary | ICD-10-CM | POA: Diagnosis not present

## 2015-09-09 DIAGNOSIS — G894 Chronic pain syndrome: Secondary | ICD-10-CM | POA: Diagnosis not present

## 2015-09-09 DIAGNOSIS — R300 Dysuria: Secondary | ICD-10-CM | POA: Diagnosis not present

## 2015-09-09 DIAGNOSIS — J4541 Moderate persistent asthma with (acute) exacerbation: Secondary | ICD-10-CM | POA: Diagnosis not present

## 2015-09-09 DIAGNOSIS — Z0000 Encounter for general adult medical examination without abnormal findings: Secondary | ICD-10-CM | POA: Diagnosis not present

## 2015-09-09 DIAGNOSIS — I10 Essential (primary) hypertension: Secondary | ICD-10-CM | POA: Diagnosis not present

## 2015-09-09 DIAGNOSIS — E7800 Pure hypercholesterolemia, unspecified: Secondary | ICD-10-CM | POA: Diagnosis not present

## 2015-09-10 DIAGNOSIS — E1165 Type 2 diabetes mellitus with hyperglycemia: Secondary | ICD-10-CM | POA: Diagnosis not present

## 2015-09-10 DIAGNOSIS — Z79899 Other long term (current) drug therapy: Secondary | ICD-10-CM | POA: Diagnosis not present

## 2015-09-15 DIAGNOSIS — J449 Chronic obstructive pulmonary disease, unspecified: Secondary | ICD-10-CM | POA: Diagnosis not present

## 2015-09-20 DIAGNOSIS — J449 Chronic obstructive pulmonary disease, unspecified: Secondary | ICD-10-CM | POA: Diagnosis not present

## 2015-09-22 DIAGNOSIS — J449 Chronic obstructive pulmonary disease, unspecified: Secondary | ICD-10-CM | POA: Diagnosis not present

## 2015-10-13 DIAGNOSIS — J449 Chronic obstructive pulmonary disease, unspecified: Secondary | ICD-10-CM | POA: Diagnosis not present

## 2015-10-18 DIAGNOSIS — J449 Chronic obstructive pulmonary disease, unspecified: Secondary | ICD-10-CM | POA: Diagnosis not present

## 2015-11-05 DIAGNOSIS — J449 Chronic obstructive pulmonary disease, unspecified: Secondary | ICD-10-CM | POA: Diagnosis not present

## 2015-11-05 DIAGNOSIS — R0689 Other abnormalities of breathing: Secondary | ICD-10-CM | POA: Diagnosis not present

## 2015-11-05 DIAGNOSIS — R05 Cough: Secondary | ICD-10-CM | POA: Diagnosis not present

## 2015-11-05 DIAGNOSIS — J479 Bronchiectasis, uncomplicated: Secondary | ICD-10-CM | POA: Diagnosis not present

## 2015-11-05 DIAGNOSIS — Z9981 Dependence on supplemental oxygen: Secondary | ICD-10-CM | POA: Diagnosis not present

## 2015-11-05 DIAGNOSIS — J069 Acute upper respiratory infection, unspecified: Secondary | ICD-10-CM | POA: Diagnosis not present

## 2015-11-13 DIAGNOSIS — J449 Chronic obstructive pulmonary disease, unspecified: Secondary | ICD-10-CM | POA: Diagnosis not present

## 2015-11-18 DIAGNOSIS — J449 Chronic obstructive pulmonary disease, unspecified: Secondary | ICD-10-CM | POA: Diagnosis not present

## 2015-11-20 DIAGNOSIS — E1165 Type 2 diabetes mellitus with hyperglycemia: Secondary | ICD-10-CM | POA: Diagnosis not present

## 2015-11-20 DIAGNOSIS — J440 Chronic obstructive pulmonary disease with acute lower respiratory infection: Secondary | ICD-10-CM | POA: Diagnosis not present

## 2015-11-20 DIAGNOSIS — Z6832 Body mass index (BMI) 32.0-32.9, adult: Secondary | ICD-10-CM | POA: Diagnosis not present

## 2015-12-11 DIAGNOSIS — J449 Chronic obstructive pulmonary disease, unspecified: Secondary | ICD-10-CM | POA: Diagnosis not present

## 2015-12-12 DIAGNOSIS — J449 Chronic obstructive pulmonary disease, unspecified: Secondary | ICD-10-CM | POA: Diagnosis not present

## 2015-12-12 DIAGNOSIS — J40 Bronchitis, not specified as acute or chronic: Secondary | ICD-10-CM | POA: Diagnosis not present

## 2015-12-12 DIAGNOSIS — Z9981 Dependence on supplemental oxygen: Secondary | ICD-10-CM | POA: Diagnosis not present

## 2015-12-12 DIAGNOSIS — J329 Chronic sinusitis, unspecified: Secondary | ICD-10-CM | POA: Diagnosis not present

## 2015-12-12 DIAGNOSIS — R05 Cough: Secondary | ICD-10-CM | POA: Diagnosis not present

## 2015-12-13 DIAGNOSIS — J449 Chronic obstructive pulmonary disease, unspecified: Secondary | ICD-10-CM | POA: Diagnosis not present

## 2015-12-18 DIAGNOSIS — J449 Chronic obstructive pulmonary disease, unspecified: Secondary | ICD-10-CM | POA: Diagnosis not present

## 2015-12-26 DIAGNOSIS — Z79899 Other long term (current) drug therapy: Secondary | ICD-10-CM | POA: Diagnosis not present

## 2015-12-26 DIAGNOSIS — Z803 Family history of malignant neoplasm of breast: Secondary | ICD-10-CM | POA: Diagnosis not present

## 2015-12-26 DIAGNOSIS — C649 Malignant neoplasm of unspecified kidney, except renal pelvis: Secondary | ICD-10-CM | POA: Diagnosis not present

## 2015-12-26 DIAGNOSIS — Z888 Allergy status to other drugs, medicaments and biological substances status: Secondary | ICD-10-CM | POA: Diagnosis not present

## 2015-12-26 DIAGNOSIS — Z801 Family history of malignant neoplasm of trachea, bronchus and lung: Secondary | ICD-10-CM | POA: Diagnosis not present

## 2015-12-26 DIAGNOSIS — Z881 Allergy status to other antibiotic agents status: Secondary | ICD-10-CM | POA: Diagnosis not present

## 2015-12-26 DIAGNOSIS — E119 Type 2 diabetes mellitus without complications: Secondary | ICD-10-CM | POA: Diagnosis not present

## 2015-12-26 DIAGNOSIS — Z87891 Personal history of nicotine dependence: Secondary | ICD-10-CM | POA: Diagnosis not present

## 2015-12-30 DIAGNOSIS — J441 Chronic obstructive pulmonary disease with (acute) exacerbation: Secondary | ICD-10-CM | POA: Diagnosis not present

## 2015-12-30 DIAGNOSIS — R0789 Other chest pain: Secondary | ICD-10-CM | POA: Diagnosis not present

## 2015-12-30 DIAGNOSIS — J471 Bronchiectasis with (acute) exacerbation: Secondary | ICD-10-CM | POA: Diagnosis not present

## 2016-01-01 DIAGNOSIS — R300 Dysuria: Secondary | ICD-10-CM | POA: Diagnosis not present

## 2016-01-07 ENCOUNTER — Other Ambulatory Visit: Payer: Self-pay | Admitting: Cardiology

## 2016-01-09 ENCOUNTER — Encounter: Payer: Self-pay | Admitting: Cardiology

## 2016-01-09 DIAGNOSIS — Z85520 Personal history of malignant carcinoid tumor of kidney: Secondary | ICD-10-CM | POA: Diagnosis not present

## 2016-01-09 DIAGNOSIS — R05 Cough: Secondary | ICD-10-CM | POA: Diagnosis not present

## 2016-01-09 DIAGNOSIS — R531 Weakness: Secondary | ICD-10-CM | POA: Diagnosis not present

## 2016-01-09 DIAGNOSIS — R69 Illness, unspecified: Secondary | ICD-10-CM | POA: Diagnosis not present

## 2016-01-09 DIAGNOSIS — M791 Myalgia: Secondary | ICD-10-CM | POA: Diagnosis not present

## 2016-01-09 DIAGNOSIS — E119 Type 2 diabetes mellitus without complications: Secondary | ICD-10-CM | POA: Diagnosis not present

## 2016-01-09 DIAGNOSIS — F17200 Nicotine dependence, unspecified, uncomplicated: Secondary | ICD-10-CM | POA: Diagnosis not present

## 2016-01-09 DIAGNOSIS — K219 Gastro-esophageal reflux disease without esophagitis: Secondary | ICD-10-CM | POA: Diagnosis not present

## 2016-01-09 DIAGNOSIS — J069 Acute upper respiratory infection, unspecified: Secondary | ICD-10-CM | POA: Diagnosis not present

## 2016-01-09 DIAGNOSIS — Z79899 Other long term (current) drug therapy: Secondary | ICD-10-CM | POA: Diagnosis not present

## 2016-01-12 DIAGNOSIS — J449 Chronic obstructive pulmonary disease, unspecified: Secondary | ICD-10-CM | POA: Diagnosis not present

## 2016-01-13 DIAGNOSIS — J449 Chronic obstructive pulmonary disease, unspecified: Secondary | ICD-10-CM | POA: Diagnosis not present

## 2016-01-15 DIAGNOSIS — J479 Bronchiectasis, uncomplicated: Secondary | ICD-10-CM | POA: Diagnosis not present

## 2016-01-22 DIAGNOSIS — J440 Chronic obstructive pulmonary disease with acute lower respiratory infection: Secondary | ICD-10-CM | POA: Diagnosis not present

## 2016-01-22 DIAGNOSIS — Z6833 Body mass index (BMI) 33.0-33.9, adult: Secondary | ICD-10-CM | POA: Diagnosis not present

## 2016-01-22 DIAGNOSIS — E1165 Type 2 diabetes mellitus with hyperglycemia: Secondary | ICD-10-CM | POA: Diagnosis not present

## 2016-02-06 ENCOUNTER — Other Ambulatory Visit: Payer: Self-pay | Admitting: Cardiology

## 2016-02-12 DIAGNOSIS — J449 Chronic obstructive pulmonary disease, unspecified: Secondary | ICD-10-CM | POA: Diagnosis not present

## 2016-02-14 DIAGNOSIS — J479 Bronchiectasis, uncomplicated: Secondary | ICD-10-CM | POA: Diagnosis not present

## 2016-02-19 DIAGNOSIS — R5383 Other fatigue: Secondary | ICD-10-CM | POA: Diagnosis not present

## 2016-02-19 DIAGNOSIS — R0789 Other chest pain: Secondary | ICD-10-CM | POA: Diagnosis not present

## 2016-02-19 DIAGNOSIS — Z87891 Personal history of nicotine dependence: Secondary | ICD-10-CM | POA: Diagnosis not present

## 2016-02-19 DIAGNOSIS — E119 Type 2 diabetes mellitus without complications: Secondary | ICD-10-CM | POA: Diagnosis not present

## 2016-02-19 DIAGNOSIS — J168 Pneumonia due to other specified infectious organisms: Secondary | ICD-10-CM | POA: Diagnosis not present

## 2016-02-19 DIAGNOSIS — R079 Chest pain, unspecified: Secondary | ICD-10-CM | POA: Diagnosis not present

## 2016-02-19 DIAGNOSIS — R05 Cough: Secondary | ICD-10-CM | POA: Diagnosis not present

## 2016-02-19 DIAGNOSIS — Z882 Allergy status to sulfonamides status: Secondary | ICD-10-CM | POA: Diagnosis not present

## 2016-02-19 DIAGNOSIS — R509 Fever, unspecified: Secondary | ICD-10-CM | POA: Diagnosis not present

## 2016-02-19 DIAGNOSIS — R0602 Shortness of breath: Secondary | ICD-10-CM | POA: Diagnosis not present

## 2016-02-19 DIAGNOSIS — J441 Chronic obstructive pulmonary disease with (acute) exacerbation: Secondary | ICD-10-CM | POA: Diagnosis not present

## 2016-02-27 DIAGNOSIS — J449 Chronic obstructive pulmonary disease, unspecified: Secondary | ICD-10-CM | POA: Diagnosis not present

## 2016-03-11 DIAGNOSIS — R0602 Shortness of breath: Secondary | ICD-10-CM | POA: Diagnosis not present

## 2016-03-11 DIAGNOSIS — J449 Chronic obstructive pulmonary disease, unspecified: Secondary | ICD-10-CM | POA: Diagnosis not present

## 2016-03-11 DIAGNOSIS — J471 Bronchiectasis with (acute) exacerbation: Secondary | ICD-10-CM | POA: Diagnosis not present

## 2016-03-11 DIAGNOSIS — R05 Cough: Secondary | ICD-10-CM | POA: Diagnosis not present

## 2016-03-11 DIAGNOSIS — Z9981 Dependence on supplemental oxygen: Secondary | ICD-10-CM | POA: Diagnosis not present

## 2016-03-14 DIAGNOSIS — J449 Chronic obstructive pulmonary disease, unspecified: Secondary | ICD-10-CM | POA: Diagnosis not present

## 2016-03-16 DIAGNOSIS — J479 Bronchiectasis, uncomplicated: Secondary | ICD-10-CM | POA: Diagnosis not present

## 2016-03-25 DIAGNOSIS — J440 Chronic obstructive pulmonary disease with acute lower respiratory infection: Secondary | ICD-10-CM | POA: Diagnosis not present

## 2016-03-25 DIAGNOSIS — E1165 Type 2 diabetes mellitus with hyperglycemia: Secondary | ICD-10-CM | POA: Diagnosis not present

## 2016-03-25 DIAGNOSIS — Z6833 Body mass index (BMI) 33.0-33.9, adult: Secondary | ICD-10-CM | POA: Diagnosis not present

## 2016-04-01 DIAGNOSIS — R931 Abnormal findings on diagnostic imaging of heart and coronary circulation: Secondary | ICD-10-CM | POA: Diagnosis not present

## 2016-04-01 DIAGNOSIS — I208 Other forms of angina pectoris: Secondary | ICD-10-CM | POA: Diagnosis not present

## 2016-04-13 DIAGNOSIS — R079 Chest pain, unspecified: Secondary | ICD-10-CM | POA: Diagnosis not present

## 2016-04-13 DIAGNOSIS — R931 Abnormal findings on diagnostic imaging of heart and coronary circulation: Secondary | ICD-10-CM | POA: Diagnosis not present

## 2016-04-14 DIAGNOSIS — J449 Chronic obstructive pulmonary disease, unspecified: Secondary | ICD-10-CM | POA: Diagnosis not present

## 2016-04-16 DIAGNOSIS — J479 Bronchiectasis, uncomplicated: Secondary | ICD-10-CM | POA: Diagnosis not present

## 2016-04-19 DIAGNOSIS — J449 Chronic obstructive pulmonary disease, unspecified: Secondary | ICD-10-CM | POA: Diagnosis not present

## 2016-04-19 DIAGNOSIS — J479 Bronchiectasis, uncomplicated: Secondary | ICD-10-CM | POA: Diagnosis not present

## 2016-04-19 DIAGNOSIS — J329 Chronic sinusitis, unspecified: Secondary | ICD-10-CM | POA: Diagnosis not present

## 2016-04-28 DIAGNOSIS — J479 Bronchiectasis, uncomplicated: Secondary | ICD-10-CM | POA: Diagnosis not present

## 2016-04-28 DIAGNOSIS — J449 Chronic obstructive pulmonary disease, unspecified: Secondary | ICD-10-CM | POA: Diagnosis not present

## 2016-04-28 DIAGNOSIS — J329 Chronic sinusitis, unspecified: Secondary | ICD-10-CM | POA: Diagnosis not present

## 2016-05-04 DIAGNOSIS — J449 Chronic obstructive pulmonary disease, unspecified: Secondary | ICD-10-CM | POA: Diagnosis not present

## 2016-05-05 ENCOUNTER — Encounter: Payer: Self-pay | Admitting: Cardiology

## 2016-05-05 ENCOUNTER — Ambulatory Visit (INDEPENDENT_AMBULATORY_CARE_PROVIDER_SITE_OTHER): Payer: Medicare HMO | Admitting: Cardiology

## 2016-05-05 ENCOUNTER — Encounter: Payer: Self-pay | Admitting: *Deleted

## 2016-05-05 VITALS — BP 131/74 | HR 83 | Ht 66.0 in | Wt 205.0 lb

## 2016-05-05 DIAGNOSIS — J449 Chronic obstructive pulmonary disease, unspecified: Secondary | ICD-10-CM

## 2016-05-05 DIAGNOSIS — R079 Chest pain, unspecified: Secondary | ICD-10-CM | POA: Diagnosis not present

## 2016-05-05 DIAGNOSIS — I5022 Chronic systolic (congestive) heart failure: Secondary | ICD-10-CM

## 2016-05-05 DIAGNOSIS — I10 Essential (primary) hypertension: Secondary | ICD-10-CM

## 2016-05-05 MED ORDER — SILDENAFIL CITRATE 100 MG PO TABS: 100.0000 mg | ORAL_TABLET | Freq: Every day | ORAL | 1 refills | Status: DC | PRN

## 2016-05-05 MED ORDER — PREDNISONE 20 MG PO TABS: 20.0000 mg | ORAL_TABLET | Freq: Every day | ORAL | 0 refills | Status: DC

## 2016-05-05 MED ORDER — AZITHROMYCIN 250 MG PO TABS: ORAL_TABLET | ORAL | 0 refills | Status: DC

## 2016-05-05 NOTE — Patient Instructions (Signed)
Your physician recommends that you schedule a follow-up appointment in: 2 MONTHS WITH DR. [place]   Your physician has recommended you make the following change in your medication:   START PREDNISONE 40 MG DAILY FOR 7 DAYS  START Z-PAK 500 MG (2 TABS) FOR 1 DAY AND THEN 250 MG (1 TAB) FOR 4 DAYS   Your physician has requested that you have an echocardiogram. Echocardiography is a painless test that uses sound waves to create images of your heart. It provides your doctor with information about the size and shape of your heart and how well your heart's chambers and valves are working. This procedure takes approximately one hour. There are no restrictions for this procedure.  CALL us ON Monday WITH AN UPDATE ON YOUR CHEST PAIN   Thank you for choosing [facility]!!

## 2016-05-05 NOTE — Progress Notes (Signed)
Clinical Summary Gregory Mitchell is a 75 y.o.male seen today for follow up of the following medical problems.   1. Chest pain - long history of atypical chest pain - cath 2013 with nonobstructive disease - 12/2014 exercise MPI with inferior scar, no current ischemia.  - nuclear stress [place] 04/2016: no specific ischemic changes by EKG. Inferior scar without ischemia, LVEF 41%, enlarged LV. Labeled high risk due to decreased LVEF.    - dull pain bilateral chest, 7/10. Tends to occur at rest. No other symptoms. No other associated symptoms. Lasts for about 2-3 hours constantly. +cough + wheezing. Better with prednisone. Still with productive cough.    2. COPD - tobacco x 20 years, quit 40 years ago.  - last saw Dr Koleen Nimrod about 1 month ago.   3. Chronic systolic HF - LVEF previously as low as 35-40%, repeat echo showed improvement to 45-50% in 04/2013. He has had a prior cath 11/2011 that showed non-obstructive disease - No recent LE edema, no orthopnea, no PND.   4. HTN - compliant with meds  Past Medical History:  Diagnosis Date  . Asthma   . Chronic renal insufficiency   . COPD (chronic obstructive pulmonary disease) ([place])   . Hypertension   . Renal insufficiency   . Type 2 diabetes mellitus (HCC)      Allergies  Allergen Reactions  . Amoxicillin     Makes eyes turn yellow and burn  . Ciprofloxacin     Pt not sure     Current Outpatient Prescriptions  Medication Sig Dispense Refill  . albuterol (PROAIR HFA) 108 (90 BASE) MCG/ACT inhaler Inhale 2 puffs into the lungs every 6 (six) hours as needed.    [name] amLODipine (NORVASC) 5 MG tablet TAKE 1 TABLET BY MOUTH EVERY DAY 30 tablet 6  . doxycycline (VIBRAMYCIN) 100 MG capsule Take 1 capsule (100 mg total) by mouth 2 (two) times daily. 14 capsule 0  . ipratropium-albuterol (DUONEB) 0.5-2.5 (3) MG/3ML SOLN Take 3 mLs by nebulization every 6 (six) hours as needed.    [name] losartan (COZAAR) 50 MG tablet TAKE 1  TABLET BY MOUTH TWICE DAILY 60 tablet 6  . montelukast (SINGULAIR) 10 MG tablet Take 10 mg by mouth at bedtime.    . Omega-3 Fatty Acids (FISH OIL) 1000 MG CAPS Take 1 capsule by mouth daily.    [name] omeprazole (PRILOSEC) 20 MG capsule Take 1 capsule (20 mg total) by mouth daily. 30 capsule 11  . predniSONE (DELTASONE) 20 MG tablet Take 2 tablets (40 mg total) by mouth daily. 10 tablet 0  . sildenafil (VIAGRA) 100 MG tablet Take 1 tablet (100 mg total) by mouth daily as needed for erectile dysfunction. 10 tablet 1  . sildenafil (VIAGRA) 100 MG tablet Take 1 tablet (100 mg total) by mouth daily as needed for erectile dysfunction. 4 tablet 11  . sitaGLIPtin (JANUVIA) 100 MG tablet Take 100 mg by mouth daily.     No current facility-administered medications for this visit.      Past Surgical History:  Procedure Laterality Date  . APPENDECTOMY    . CHOLECYSTECTOMY    . NASAL SINUS SURGERY    . NEPHRECTOMY     RIGHT (Question CA.  No further therapy needed.)     Allergies  Allergen Reactions  . Amoxicillin     Makes eyes turn yellow and burn  . Ciprofloxacin     Pt not sure      Family History  Problem Relation Age of Onset  . Lung cancer Brother   . Stroke Mother   . Breast cancer Mother      Social History Gregory Mitchell reports that he quit smoking about 39 years ago. His smoking use included Cigarettes. He started smoking about 58 years ago. He has a 15.00 pack-year smoking history. He quit smokeless tobacco use about 10 years ago. His smokeless tobacco use included Chew. Gregory Mitchell has no alcohol history on file.   Review of Systems CONSTITUTIONAL: No weight loss, fever, chills, weakness or fatigue.  HEENT: Eyes: No visual loss, blurred vision, double vision or yellow sclerae.No hearing loss, sneezing, congestion, runny nose or sore throat.  SKIN: No rash or itching.  CARDIOVASCULAR: per HPI RESPIRATORY: No shortness of breath, cough or sputum.  GASTROINTESTINAL: No  anorexia, nausea, vomiting or diarrhea. No abdominal pain or blood.  GENITOURINARY: No burning on urination, no polyuria NEUROLOGICAL: No headache, dizziness, syncope, paralysis, ataxia, numbness or tingling in the extremities. No change in bowel or bladder control.  MUSCULOSKELETAL: No muscle, back pain, joint pain or stiffness.  LYMPHATICS: No enlarged nodes. No history of splenectomy.  PSYCHIATRIC: No history of depression or anxiety.  ENDOCRINOLOGIC: No reports of sweating, cold or heat intolerance. No polyuria or polydipsia.  [name]   Physical Examination Vitals:   05/05/16 1124  BP: 131/74  Pulse: 83   Vitals:   05/05/16 1124  Weight: 205 lb (93 kg)  Height: 5\' 6"  (1.676 m)    Gen: resting comfortably, no acute distress HEENT: no scleral icterus, pupils equal round and reactive, no palptable cervical adenopathy,  CV: RRR, no m/r/g, no jvd Resp: bilateral wheezing.  GI: abdomen is soft, non-tender, non-distended, normal bowel sounds, no hepatosplenomegaly MSK: extremities are warm, no edema.  Skin: warm, no rash Neuro:  no focal deficits Psych: appropriate affect   Diagnostic Studies 04/2013 echo Study Conclusions  - Left ventricle: The cavity size was mildly dilated. Wall thickness was increased in a pattern of mild to moderateLVH. Systolic function was mildly reduced. The estimated ejection fraction was in the range of 45% to 50%. There is mild global hypokinesis. There was an increased relative contribution of atrial contraction to ventricular filling. Doppler parameters are consistent with abnormal left ventricular relaxation (grade 1 diastolic dysfunction). Doppler parameters are consistent with high ventricular filling pressure. - Mitral valve: Calcified annulus. Mildly thickened leaflets . Trivial to mild regurgitation. - Left atrium: The atrium was mildly dilated. - Right ventricle: The cavity size was mildly dilated. Wall thickness was  normal. Systolic function was mildly reduced. - Right atrium: The atrium was mildly dilated. - Pericardium, extracardiac: A trivial pericardial effusion was identified.   11/2011 Cath Procedural Findings:  Hemodynamics:  AO 157/77 LV 154/22  Coronary angiography:  Coronary dominance: Right  Left mainstem: Normal  Left anterior descending (LAD): Large vessel wrapping the apex. Mild lumina irregularities. D1 small normal. D2 small normal.  Left circumflex (LCx): AV group luminal irregularities. OM1 large and normal. PL x 2 small normal  Right coronary artery (RCA): Dominant. Long mid 25%. PDA normal. PL moderate and normal.   Left ventriculography: Left ventricle not injected  Final Conclusions: Mild coronary plaque. Non ischemic cardiomyopathy  Recommendations: Medical management.     12/2014 Exercise Cardiolite IMPRESSION: 1. Moderate size region of inferior wall myocardial scar. No ischemic territories seen. Overlying soft tissue attenuation artifact cannot entirely be ruled out.  2. Mild inferior wall hypokinesis.  3. Left ventricular ejection fraction 48%  4. Low to intermediate-risk stress test findings*.       Assessment and Plan  1. Chest pain - atypical symptoms, have improved with nebulizers and steroids in the past - multiple nuclear stress test dating back at least to 2013 have shown inferior scar. Most recent test 04/2016 at [facility] with similar findings. Cath 2013 with nonobstructive disease.  - we will repeat echo to look for any significant change - at this time do not suspect symptoms are cardiac, likely related to his COPD. Trial of predinsone and azithro to see if symptoms improve, they have in the past on this regimen. Ive asked him to call us next week to update [place] on symptoms.   2. COPD - followed  by Dr Amanda Pea and Dr Koleen Nimrod - active wheezing and SOB, will give 7 day course of prednisone 40mg  daily along with azithromycin  3. Chronic systolic heart failure - no current symptoms, we will continue current meds  4. HTN - at goal, we will continue current meds  5. Erectile dysfunction - refill his viagra.        Arnoldo Lenis, M.D.

## 2016-05-14 DIAGNOSIS — J449 Chronic obstructive pulmonary disease, unspecified: Secondary | ICD-10-CM | POA: Diagnosis not present

## 2016-05-16 DIAGNOSIS — J479 Bronchiectasis, uncomplicated: Secondary | ICD-10-CM | POA: Diagnosis not present

## 2016-05-27 ENCOUNTER — Other Ambulatory Visit: Payer: Medicare HMO

## 2016-05-28 ENCOUNTER — Other Ambulatory Visit: Payer: Self-pay

## 2016-05-28 ENCOUNTER — Ambulatory Visit (INDEPENDENT_AMBULATORY_CARE_PROVIDER_SITE_OTHER): Payer: Medicare HMO

## 2016-05-28 DIAGNOSIS — R079 Chest pain, unspecified: Secondary | ICD-10-CM

## 2016-06-02 ENCOUNTER — Telehealth: Payer: Self-pay | Admitting: *Deleted

## 2016-06-02 NOTE — Telephone Encounter (Signed)
-----   Message from Arnoldo Lenis, MD sent at 05/31/2016  1:35 PM EDT ----- Echo overall looks good  J BrancH MD

## 2016-06-02 NOTE — Telephone Encounter (Signed)
Pt aware - routed to pcp  

## 2016-06-03 DIAGNOSIS — J449 Chronic obstructive pulmonary disease, unspecified: Secondary | ICD-10-CM | POA: Diagnosis not present

## 2016-06-14 DIAGNOSIS — J449 Chronic obstructive pulmonary disease, unspecified: Secondary | ICD-10-CM | POA: Diagnosis not present

## 2016-06-14 DIAGNOSIS — J343 Hypertrophy of nasal turbinates: Secondary | ICD-10-CM | POA: Diagnosis not present

## 2016-06-14 DIAGNOSIS — J342 Deviated nasal septum: Secondary | ICD-10-CM | POA: Diagnosis not present

## 2016-06-14 DIAGNOSIS — R0981 Nasal congestion: Secondary | ICD-10-CM | POA: Diagnosis not present

## 2016-06-14 DIAGNOSIS — R0982 Postnasal drip: Secondary | ICD-10-CM | POA: Diagnosis not present

## 2016-06-14 DIAGNOSIS — J320 Chronic maxillary sinusitis: Secondary | ICD-10-CM | POA: Diagnosis not present

## 2016-06-14 DIAGNOSIS — R51 Headache: Secondary | ICD-10-CM | POA: Diagnosis not present

## 2016-06-16 DIAGNOSIS — J479 Bronchiectasis, uncomplicated: Secondary | ICD-10-CM | POA: Diagnosis not present

## 2016-06-25 DIAGNOSIS — R05 Cough: Secondary | ICD-10-CM | POA: Diagnosis not present

## 2016-06-25 DIAGNOSIS — M79605 Pain in left leg: Secondary | ICD-10-CM | POA: Diagnosis not present

## 2016-06-25 DIAGNOSIS — M79604 Pain in right leg: Secondary | ICD-10-CM | POA: Diagnosis not present

## 2016-06-25 DIAGNOSIS — J069 Acute upper respiratory infection, unspecified: Secondary | ICD-10-CM | POA: Diagnosis not present

## 2016-07-02 DIAGNOSIS — J189 Pneumonia, unspecified organism: Secondary | ICD-10-CM | POA: Diagnosis not present

## 2016-07-02 DIAGNOSIS — K219 Gastro-esophageal reflux disease without esophagitis: Secondary | ICD-10-CM | POA: Diagnosis not present

## 2016-07-02 DIAGNOSIS — R05 Cough: Secondary | ICD-10-CM | POA: Diagnosis not present

## 2016-07-02 DIAGNOSIS — Z87891 Personal history of nicotine dependence: Secondary | ICD-10-CM | POA: Diagnosis not present

## 2016-07-02 DIAGNOSIS — Z85528 Personal history of other malignant neoplasm of kidney: Secondary | ICD-10-CM | POA: Diagnosis not present

## 2016-07-02 DIAGNOSIS — K449 Diaphragmatic hernia without obstruction or gangrene: Secondary | ICD-10-CM | POA: Diagnosis not present

## 2016-07-02 DIAGNOSIS — J440 Chronic obstructive pulmonary disease with acute lower respiratory infection: Secondary | ICD-10-CM | POA: Diagnosis not present

## 2016-07-02 DIAGNOSIS — J158 Pneumonia due to other specified bacteria: Secondary | ICD-10-CM | POA: Diagnosis not present

## 2016-07-02 DIAGNOSIS — Z23 Encounter for immunization: Secondary | ICD-10-CM | POA: Diagnosis not present

## 2016-07-02 DIAGNOSIS — E7800 Pure hypercholesterolemia, unspecified: Secondary | ICD-10-CM | POA: Diagnosis not present

## 2016-07-02 DIAGNOSIS — R0789 Other chest pain: Secondary | ICD-10-CM | POA: Diagnosis not present

## 2016-07-02 DIAGNOSIS — J441 Chronic obstructive pulmonary disease with (acute) exacerbation: Secondary | ICD-10-CM | POA: Diagnosis not present

## 2016-07-02 DIAGNOSIS — R079 Chest pain, unspecified: Secondary | ICD-10-CM | POA: Diagnosis not present

## 2016-07-02 DIAGNOSIS — E1122 Type 2 diabetes mellitus with diabetic chronic kidney disease: Secondary | ICD-10-CM | POA: Diagnosis not present

## 2016-07-02 DIAGNOSIS — J9601 Acute respiratory failure with hypoxia: Secondary | ICD-10-CM | POA: Diagnosis not present

## 2016-07-03 DIAGNOSIS — J9601 Acute respiratory failure with hypoxia: Secondary | ICD-10-CM | POA: Diagnosis not present

## 2016-07-03 DIAGNOSIS — J158 Pneumonia due to other specified bacteria: Secondary | ICD-10-CM | POA: Diagnosis not present

## 2016-07-03 DIAGNOSIS — J441 Chronic obstructive pulmonary disease with (acute) exacerbation: Secondary | ICD-10-CM | POA: Diagnosis not present

## 2016-07-03 DIAGNOSIS — J440 Chronic obstructive pulmonary disease with acute lower respiratory infection: Secondary | ICD-10-CM | POA: Diagnosis not present

## 2016-07-03 DIAGNOSIS — K449 Diaphragmatic hernia without obstruction or gangrene: Secondary | ICD-10-CM | POA: Diagnosis not present

## 2016-07-04 DIAGNOSIS — K449 Diaphragmatic hernia without obstruction or gangrene: Secondary | ICD-10-CM | POA: Diagnosis not present

## 2016-07-04 DIAGNOSIS — J441 Chronic obstructive pulmonary disease with (acute) exacerbation: Secondary | ICD-10-CM | POA: Diagnosis not present

## 2016-07-04 DIAGNOSIS — J440 Chronic obstructive pulmonary disease with acute lower respiratory infection: Secondary | ICD-10-CM | POA: Diagnosis not present

## 2016-07-04 DIAGNOSIS — J158 Pneumonia due to other specified bacteria: Secondary | ICD-10-CM | POA: Diagnosis not present

## 2016-07-04 DIAGNOSIS — J9601 Acute respiratory failure with hypoxia: Secondary | ICD-10-CM | POA: Diagnosis not present

## 2016-07-05 DIAGNOSIS — J158 Pneumonia due to other specified bacteria: Secondary | ICD-10-CM | POA: Diagnosis not present

## 2016-07-05 DIAGNOSIS — K449 Diaphragmatic hernia without obstruction or gangrene: Secondary | ICD-10-CM | POA: Diagnosis not present

## 2016-07-05 DIAGNOSIS — J440 Chronic obstructive pulmonary disease with acute lower respiratory infection: Secondary | ICD-10-CM | POA: Diagnosis not present

## 2016-07-05 DIAGNOSIS — J441 Chronic obstructive pulmonary disease with (acute) exacerbation: Secondary | ICD-10-CM | POA: Diagnosis not present

## 2016-07-05 DIAGNOSIS — J9601 Acute respiratory failure with hypoxia: Secondary | ICD-10-CM | POA: Diagnosis not present

## 2016-07-05 DIAGNOSIS — Z23 Encounter for immunization: Secondary | ICD-10-CM | POA: Diagnosis not present

## 2016-07-06 DIAGNOSIS — J449 Chronic obstructive pulmonary disease, unspecified: Secondary | ICD-10-CM | POA: Diagnosis not present

## 2016-07-08 ENCOUNTER — Ambulatory Visit: Payer: Medicare HMO | Admitting: Cardiology

## 2016-07-12 DIAGNOSIS — Z125 Encounter for screening for malignant neoplasm of prostate: Secondary | ICD-10-CM | POA: Diagnosis not present

## 2016-07-12 DIAGNOSIS — J440 Chronic obstructive pulmonary disease with acute lower respiratory infection: Secondary | ICD-10-CM | POA: Diagnosis not present

## 2016-07-12 DIAGNOSIS — J17 Pneumonia in diseases classified elsewhere: Secondary | ICD-10-CM | POA: Diagnosis not present

## 2016-07-12 DIAGNOSIS — Z1389 Encounter for screening for other disorder: Secondary | ICD-10-CM | POA: Diagnosis not present

## 2016-07-12 DIAGNOSIS — R5383 Other fatigue: Secondary | ICD-10-CM | POA: Diagnosis not present

## 2016-07-12 DIAGNOSIS — Z6833 Body mass index (BMI) 33.0-33.9, adult: Secondary | ICD-10-CM | POA: Diagnosis not present

## 2016-07-12 DIAGNOSIS — Z0000 Encounter for general adult medical examination without abnormal findings: Secondary | ICD-10-CM | POA: Diagnosis not present

## 2016-07-14 DIAGNOSIS — J449 Chronic obstructive pulmonary disease, unspecified: Secondary | ICD-10-CM | POA: Diagnosis not present

## 2016-07-16 ENCOUNTER — Other Ambulatory Visit: Payer: Self-pay | Admitting: *Deleted

## 2016-07-16 DIAGNOSIS — J479 Bronchiectasis, uncomplicated: Secondary | ICD-10-CM | POA: Diagnosis not present

## 2016-07-16 MED ORDER — SILDENAFIL CITRATE 100 MG PO TABS: 100.0000 mg | ORAL_TABLET | Freq: Every day | ORAL | 1 refills | Status: DC | PRN

## 2016-07-19 DIAGNOSIS — H04123 Dry eye syndrome of bilateral lacrimal glands: Secondary | ICD-10-CM | POA: Diagnosis not present

## 2016-07-19 DIAGNOSIS — E119 Type 2 diabetes mellitus without complications: Secondary | ICD-10-CM | POA: Diagnosis not present

## 2016-07-19 DIAGNOSIS — H25813 Combined forms of age-related cataract, bilateral: Secondary | ICD-10-CM | POA: Diagnosis not present

## 2016-07-19 DIAGNOSIS — Z8669 Personal history of other diseases of the nervous system and sense organs: Secondary | ICD-10-CM | POA: Diagnosis not present

## 2016-07-29 DIAGNOSIS — J471 Bronchiectasis with (acute) exacerbation: Secondary | ICD-10-CM | POA: Diagnosis not present

## 2016-07-29 DIAGNOSIS — J449 Chronic obstructive pulmonary disease, unspecified: Secondary | ICD-10-CM | POA: Diagnosis not present

## 2016-07-29 DIAGNOSIS — R0603 Acute respiratory distress: Secondary | ICD-10-CM | POA: Diagnosis not present

## 2016-07-29 DIAGNOSIS — A498 Other bacterial infections of unspecified site: Secondary | ICD-10-CM | POA: Diagnosis not present

## 2016-07-29 DIAGNOSIS — J988 Other specified respiratory disorders: Secondary | ICD-10-CM | POA: Diagnosis not present

## 2016-08-03 DIAGNOSIS — J471 Bronchiectasis with (acute) exacerbation: Secondary | ICD-10-CM | POA: Diagnosis not present

## 2016-08-09 ENCOUNTER — Other Ambulatory Visit: Payer: Self-pay | Admitting: Cardiology

## 2016-08-14 DIAGNOSIS — J449 Chronic obstructive pulmonary disease, unspecified: Secondary | ICD-10-CM | POA: Diagnosis not present

## 2016-08-16 DIAGNOSIS — J479 Bronchiectasis, uncomplicated: Secondary | ICD-10-CM | POA: Diagnosis not present

## 2016-08-17 ENCOUNTER — Encounter: Payer: Self-pay | Admitting: *Deleted

## 2016-08-17 ENCOUNTER — Ambulatory Visit (INDEPENDENT_AMBULATORY_CARE_PROVIDER_SITE_OTHER): Payer: Medicare HMO | Admitting: Cardiology

## 2016-08-17 ENCOUNTER — Encounter: Payer: Self-pay | Admitting: Cardiology

## 2016-08-17 VITALS — BP 131/70 | HR 84 | Ht 66.0 in | Wt 200.2 lb

## 2016-08-17 DIAGNOSIS — R0789 Other chest pain: Secondary | ICD-10-CM

## 2016-08-17 DIAGNOSIS — I10 Essential (primary) hypertension: Secondary | ICD-10-CM | POA: Diagnosis not present

## 2016-08-17 DIAGNOSIS — Z136 Encounter for screening for cardiovascular disorders: Secondary | ICD-10-CM | POA: Diagnosis not present

## 2016-08-17 DIAGNOSIS — J449 Chronic obstructive pulmonary disease, unspecified: Secondary | ICD-10-CM | POA: Diagnosis not present

## 2016-08-17 NOTE — Progress Notes (Signed)
Clinical Summary Gregory Mitchell is a 76 y.o.male seen today for follow up of the following medical problems.   1. Chest pain - long history of atypical chest pain - cath 2013 with nonobstructive disease - 12/2014 exercise MPI with inferior scar, no current ischemia.  - nuclear stress morehead 04/2016: no specific ischemic changes by EKG. Inferior scar without ischemia, LVEF 41%, enlarged LV. Labeled high risk due to decreased LVEF.    - chest pain unchanged since last visit. Occasional cough and wheezing. Previously better with prednisone.   2. COPD - tobacco x 20 years, quit 40 years ago.  - followed by Dr Koleen Nimrod  3. Chronic systolic HF - LVEF previously as low as 35-40%, repeat echo showed improvement to 45-50% in 04/2013. He has had a prior cath 11/2011 that showed non-obstructive disease  - since last visit completed 05/2016 echo LVEF 0000000, grade I diastolic dysfunction   4. HTN - he is compliant with meds   5. HL - muscle aches on statin prevoiusly    Has 6 kids, 17 grands.  Past Medical History:  Diagnosis Date  . Asthma   . Chronic renal insufficiency   . COPD (chronic obstructive pulmonary disease) ([place])   . Hypertension   . Renal insufficiency   . Type 2 diabetes mellitus (HCC)      Allergies  Allergen Reactions  . Amoxicillin     Makes eyes turn yellow and burn  . Ciprofloxacin     Pt not sure     Current Outpatient Prescriptions  Medication Sig Dispense Refill  . albuterol (PROAIR HFA) 108 (90 BASE) MCG/ACT inhaler Inhale 2 puffs into the lungs every 6 (six) hours as needed.    [name] amLODipine (NORVASC) 5 MG tablet TAKE 1 TABLET BY MOUTH EVERY DAY 30 tablet 6  . azithromycin (ZITHROMAX) 250 MG tablet TAKE 2 TABS FOR 1 DAY AND 1 TAB DAILY FOR 4 DAYS 6 tablet 0  . glipiZIDE (GLUCOTROL XL) 10 MG 24 hr tablet Take 10 mg by mouth daily with breakfast.    . ipratropium-albuterol (DUONEB) 0.5-2.5 (3) MG/3ML SOLN Take 3 mLs by nebulization every  6 (six) hours as needed.    [name] losartan (COZAAR) 50 MG tablet TAKE 1 TABLET BY MOUTH TWICE DAILY 60 tablet 6  . metformin (FORTAMET) 1000 MG (OSM) 24 hr tablet Take 1 tablet by mouth 2 (two) times daily.    . montelukast (SINGULAIR) 10 MG tablet Take 10 mg by mouth at bedtime.    . Omega-3 Fatty Acids (FISH OIL) 1000 MG CAPS Take 1 capsule by mouth 3 (three) times daily.     . predniSONE (DELTASONE) 20 MG tablet Take 1 tablet (20 mg total) by mouth daily with breakfast. 14 tablet 0  . sildenafil (VIAGRA) 100 MG tablet Take 1 tablet (100 mg total) by mouth daily as needed for erectile dysfunction. 10 tablet 1  . vitamin E 400 UNIT capsule Take 400 Units by mouth daily.     No current facility-administered medications for this visit.      Past Surgical History:  Procedure Laterality Date  . APPENDECTOMY    . CHOLECYSTECTOMY    . NASAL SINUS SURGERY    . NEPHRECTOMY     RIGHT (Question CA.  No further therapy needed.)     Allergies  Allergen Reactions  . Amoxicillin     Makes eyes turn yellow and burn  . Ciprofloxacin     Pt not sure  Family History  Problem Relation Age of Onset  . Lung cancer Brother   . Stroke Mother   . Breast cancer Mother      Social History Gregory Mitchell reports that he quit smoking about 40 years ago. His smoking use included Cigarettes. He started smoking about 59 years ago. He has a 15.00 pack-year smoking history. He quit smokeless tobacco use about 10 years ago. His smokeless tobacco use included Chew. Gregory Mitchell has no alcohol history on file.   Review of Systems CONSTITUTIONAL: No weight loss, fever, chills, weakness or fatigue.  HEENT: Eyes: No visual loss, blurred vision, double vision or yellow sclerae.No hearing loss, sneezing, congestion, runny nose or sore throat.  SKIN: No rash or itching.  CARDIOVASCULAR: per hpi RESPIRATORY: No shortness of breath, cough or sputum.  GASTROINTESTINAL: No anorexia, nausea, vomiting or  diarrhea. No abdominal pain or blood.  GENITOURINARY: No burning on urination, no polyuria NEUROLOGICAL: No headache, dizziness, syncope, paralysis, ataxia, numbness or tingling in the extremities. No change in bowel or bladder control.  MUSCULOSKELETAL: No muscle, back pain, joint pain or stiffness.  LYMPHATICS: No enlarged nodes. No history of splenectomy.  PSYCHIATRIC: No history of depression or anxiety.  ENDOCRINOLOGIC: No reports of sweating, cold or heat intolerance. No polyuria or polydipsia.  [name]   Physical Examination Vitals:   08/17/16 1411  BP: 131/70  Pulse: 84   Vitals:   08/17/16 1411  Weight: 200 lb 3.2 oz (90.8 kg)  Height: 5\' 6"  (1.676 m)    Gen: resting comfortably, no acute distress HEENT: no scleral icterus, pupils equal round and reactive, no palptable cervical adenopathy,  CV: RRR, no m/r/g, no jvd Resp: Clear to auscultation bilaterally GI: abdomen is soft, non-tender, non-distended, normal bowel sounds, no hepatosplenomegaly MSK: extremities are warm, no edema.  Skin: warm, no rash Neuro:  no focal deficits Psych: appropriate affect   Diagnostic Studies 04/2013 echo Study Conclusions  - Left ventricle: The cavity size was mildly dilated. Wall thickness was increased in a pattern of mild to moderateLVH. Systolic function was mildly reduced. The estimated ejection fraction was in the range of 45% to 50%. There is mild global hypokinesis. There was an increased relative contribution of atrial contraction to ventricular filling. Doppler parameters are consistent with abnormal left ventricular relaxation (grade 1 diastolic dysfunction). Doppler parameters are consistent with high ventricular filling pressure. - Mitral valve: Calcified annulus. Mildly thickened leaflets . Trivial to mild regurgitation. - Left atrium: The atrium was mildly dilated. - Right ventricle: The cavity size was mildly dilated. Wall thickness was normal.  Systolic function was mildly reduced. - Right atrium: The atrium was mildly dilated. - Pericardium, extracardiac: A trivial pericardial effusion was identified.   11/2011 Cath Procedural Findings:  Hemodynamics:  AO157/77 LV154/22  Coronary angiography:  Coronary dominance: Right  Left mainstem: Normal  Left anterior descending (LAD): Large vessel wrapping the apex. Mild lumina irregularities. D1 small normal. D2 small normal.  Left circumflex (LCx): AV group luminal irregularities. OM1 large and normal. PL x 2 small normal  Right coronary artery (RCA): Dominant. Long mid 25%. PDA normal. PL moderate and normal.   Left ventriculography: Left ventricle not injected  Final Conclusions: Mild coronary plaque. Non ischemic cardiomyopathy  Recommendations: Medical management.     12/2014 Exercise Cardiolite IMPRESSION: 1. Moderate size region of inferior wall myocardial scar. No ischemic territories seen. Overlying soft tissue attenuation artifact cannot entirely be ruled out.  2. Mild inferior wall hypokinesis.  3. Left ventricular  ejection fraction 48%  4. Low to intermediate-risk stress test findings*.   05/2016 echo Study Conclusions  - Left ventricle: The cavity size was normal. Wall thickness was   increased in a pattern of mild LVH. Systolic function was normal.   The estimated ejection fraction was in the range of 55% to 60%.   Wall motion was normal; there were no regional wall motion   abnormalities. Doppler parameters are consistent with abnormal   left ventricular relaxation (grade 1 diastolic dysfunction). - Aortic valve: Mildly calcified annulus. Trileaflet; mildly   thickened leaflets. Valve area (VTI): 4.03 cm^2. Valve area   (Vmax): 4.25 cm^2. Valve area (Vmean): 4.49 cm^2. - Left atrium: The atrium was  mildly to moderately dilated. - Atrial septum: No defect or patent foramen ovale was identified. - Technically adequate study.    Assessment and Plan  1. Chest pain - atypical symptoms, have improved with nebulizers and steroids in the past - multiple nuclear stress test dating back at least to 2013 have shown inferior scar. Most recent test 04/2016 at [facility] with similar findings. Cath 2013 with nonobstructive disease.  -no plans for further ischemic testing at this time  2. COPD - followed by Dr Amanda Pea and Dr Koleen Nimrod - he is interested about seeing pulmonary in [place], we will refer to Dr Luan Pulling.   3. Chronic systolic heart failure - no recent symptoms. Echo shows that LVEF as now noramlized - continue current meds  4. HTN - he is at goal, we will continue current meds  5.AAA screen - male over 63 with smoking history, order AAA screening US.    Request labs from pcp    Arnoldo Lenis, M.D.

## 2016-08-17 NOTE — Patient Instructions (Signed)
Your physician wants you to follow-up in: [place] DR. BRANCH You will receive a reminder letter in the mail two months in advance. If you don't receive a letter, please call our office to schedule the follow-up appointment.  Your physician has recommended you make the following change in your medication:   START ASPIRIN 81 MG DAILY  You have been referred to DR [place] has requested that you have an abdominal aorta duplex. During this test, an ultrasound is used to evaluate the aorta. Allow 30 minutes for this exam. Do not eat after midnight the day before and avoid carbonated beverages  Thank you for choosing [facility]!!

## 2016-08-20 DIAGNOSIS — R300 Dysuria: Secondary | ICD-10-CM | POA: Diagnosis not present

## 2016-08-20 DIAGNOSIS — R1013 Epigastric pain: Secondary | ICD-10-CM | POA: Diagnosis not present

## 2016-08-20 DIAGNOSIS — R0989 Other specified symptoms and signs involving the circulatory and respiratory systems: Secondary | ICD-10-CM | POA: Diagnosis not present

## 2016-08-20 DIAGNOSIS — E1159 Type 2 diabetes mellitus with other circulatory complications: Secondary | ICD-10-CM | POA: Diagnosis not present

## 2016-08-20 DIAGNOSIS — R6889 Other general symptoms and signs: Secondary | ICD-10-CM | POA: Diagnosis not present

## 2016-09-01 DIAGNOSIS — J449 Chronic obstructive pulmonary disease, unspecified: Secondary | ICD-10-CM | POA: Diagnosis not present

## 2016-09-01 DIAGNOSIS — R05 Cough: Secondary | ICD-10-CM | POA: Diagnosis not present

## 2016-09-01 DIAGNOSIS — R0689 Other abnormalities of breathing: Secondary | ICD-10-CM | POA: Diagnosis not present

## 2016-09-01 DIAGNOSIS — J471 Bronchiectasis with (acute) exacerbation: Secondary | ICD-10-CM | POA: Diagnosis not present

## 2016-09-06 DIAGNOSIS — K5900 Constipation, unspecified: Secondary | ICD-10-CM | POA: Diagnosis not present

## 2016-09-06 DIAGNOSIS — Z79899 Other long term (current) drug therapy: Secondary | ICD-10-CM | POA: Diagnosis not present

## 2016-09-06 DIAGNOSIS — M1990 Unspecified osteoarthritis, unspecified site: Secondary | ICD-10-CM | POA: Diagnosis not present

## 2016-09-06 DIAGNOSIS — J449 Chronic obstructive pulmonary disease, unspecified: Secondary | ICD-10-CM | POA: Diagnosis not present

## 2016-09-06 DIAGNOSIS — E7800 Pure hypercholesterolemia, unspecified: Secondary | ICD-10-CM | POA: Diagnosis not present

## 2016-09-06 DIAGNOSIS — Z833 Family history of diabetes mellitus: Secondary | ICD-10-CM | POA: Diagnosis not present

## 2016-09-06 DIAGNOSIS — E119 Type 2 diabetes mellitus without complications: Secondary | ICD-10-CM | POA: Diagnosis not present

## 2016-09-06 DIAGNOSIS — Z85528 Personal history of other malignant neoplasm of kidney: Secondary | ICD-10-CM | POA: Diagnosis not present

## 2016-09-08 DIAGNOSIS — J449 Chronic obstructive pulmonary disease, unspecified: Secondary | ICD-10-CM | POA: Diagnosis not present

## 2016-09-08 DIAGNOSIS — I10 Essential (primary) hypertension: Secondary | ICD-10-CM | POA: Diagnosis not present

## 2016-09-10 DIAGNOSIS — J449 Chronic obstructive pulmonary disease, unspecified: Secondary | ICD-10-CM | POA: Diagnosis not present

## 2016-09-14 DIAGNOSIS — J449 Chronic obstructive pulmonary disease, unspecified: Secondary | ICD-10-CM | POA: Diagnosis not present

## 2016-09-14 DIAGNOSIS — R6889 Other general symptoms and signs: Secondary | ICD-10-CM | POA: Diagnosis not present

## 2016-09-14 DIAGNOSIS — M2550 Pain in unspecified joint: Secondary | ICD-10-CM | POA: Diagnosis not present

## 2016-09-15 ENCOUNTER — Ambulatory Visit: Payer: Medicare HMO

## 2016-09-15 DIAGNOSIS — Z136 Encounter for screening for cardiovascular disorders: Secondary | ICD-10-CM

## 2016-09-16 DIAGNOSIS — J479 Bronchiectasis, uncomplicated: Secondary | ICD-10-CM | POA: Diagnosis not present

## 2016-09-21 ENCOUNTER — Telehealth: Payer: Self-pay | Admitting: *Deleted

## 2016-09-21 DIAGNOSIS — Z6831 Body mass index (BMI) 31.0-31.9, adult: Secondary | ICD-10-CM | POA: Diagnosis not present

## 2016-09-21 DIAGNOSIS — E1165 Type 2 diabetes mellitus with hyperglycemia: Secondary | ICD-10-CM | POA: Diagnosis not present

## 2016-09-21 DIAGNOSIS — J440 Chronic obstructive pulmonary disease with acute lower respiratory infection: Secondary | ICD-10-CM | POA: Diagnosis not present

## 2016-09-21 DIAGNOSIS — N401 Enlarged prostate with lower urinary tract symptoms: Secondary | ICD-10-CM | POA: Diagnosis not present

## 2016-09-21 DIAGNOSIS — Z79899 Other long term (current) drug therapy: Secondary | ICD-10-CM | POA: Diagnosis not present

## 2016-09-21 NOTE — Telephone Encounter (Signed)
-----   Message from Arnoldo Lenis, MD sent at 09/20/2016  2:52 PM EST ----- Normal abdominal US, no evidence of aneurysm  J BrancH MD

## 2016-09-21 NOTE — Telephone Encounter (Signed)
Pt aware - routed to pcp  

## 2016-10-12 DIAGNOSIS — J449 Chronic obstructive pulmonary disease, unspecified: Secondary | ICD-10-CM | POA: Diagnosis not present

## 2016-10-14 DIAGNOSIS — J479 Bronchiectasis, uncomplicated: Secondary | ICD-10-CM | POA: Diagnosis not present

## 2016-10-21 ENCOUNTER — Other Ambulatory Visit (HOSPITAL_COMMUNITY): Payer: Self-pay | Admitting: Pulmonary Disease

## 2016-10-21 ENCOUNTER — Ambulatory Visit (HOSPITAL_COMMUNITY)
Admission: RE | Admit: 2016-10-21 | Discharge: 2016-10-21 | Disposition: A | Discharge status: Home / Self Care | Payer: Medicare HMO | Source: Ambulatory Visit | Attending: Pulmonary Disease | Admitting: Pulmonary Disease

## 2016-10-21 DIAGNOSIS — J441 Chronic obstructive pulmonary disease with (acute) exacerbation: Secondary | ICD-10-CM | POA: Diagnosis not present

## 2016-10-21 DIAGNOSIS — R079 Chest pain, unspecified: Secondary | ICD-10-CM | POA: Diagnosis not present

## 2016-10-21 DIAGNOSIS — R059 Cough, unspecified: Secondary | ICD-10-CM

## 2016-10-21 DIAGNOSIS — E119 Type 2 diabetes mellitus without complications: Secondary | ICD-10-CM | POA: Diagnosis not present

## 2016-10-21 DIAGNOSIS — R05 Cough: Secondary | ICD-10-CM | POA: Insufficient documentation

## 2016-10-21 DIAGNOSIS — J449 Chronic obstructive pulmonary disease, unspecified: Secondary | ICD-10-CM | POA: Diagnosis not present

## 2016-10-21 DIAGNOSIS — I10 Essential (primary) hypertension: Secondary | ICD-10-CM | POA: Diagnosis not present

## 2016-11-08 DIAGNOSIS — J449 Chronic obstructive pulmonary disease, unspecified: Secondary | ICD-10-CM | POA: Diagnosis not present

## 2016-11-11 DIAGNOSIS — J449 Chronic obstructive pulmonary disease, unspecified: Secondary | ICD-10-CM | POA: Diagnosis not present

## 2016-11-11 DIAGNOSIS — R1111 Vomiting without nausea: Secondary | ICD-10-CM | POA: Diagnosis not present

## 2016-11-11 DIAGNOSIS — R1030 Lower abdominal pain, unspecified: Secondary | ICD-10-CM | POA: Diagnosis not present

## 2016-11-11 DIAGNOSIS — K5900 Constipation, unspecified: Secondary | ICD-10-CM | POA: Diagnosis not present

## 2016-11-11 DIAGNOSIS — Z85528 Personal history of other malignant neoplasm of kidney: Secondary | ICD-10-CM | POA: Diagnosis not present

## 2016-11-11 DIAGNOSIS — E7800 Pure hypercholesterolemia, unspecified: Secondary | ICD-10-CM | POA: Diagnosis not present

## 2016-11-11 DIAGNOSIS — R112 Nausea with vomiting, unspecified: Secondary | ICD-10-CM | POA: Diagnosis not present

## 2016-11-11 DIAGNOSIS — R1110 Vomiting, unspecified: Secondary | ICD-10-CM | POA: Diagnosis not present

## 2016-11-11 DIAGNOSIS — E119 Type 2 diabetes mellitus without complications: Secondary | ICD-10-CM | POA: Diagnosis not present

## 2016-11-11 DIAGNOSIS — Z79899 Other long term (current) drug therapy: Secondary | ICD-10-CM | POA: Diagnosis not present

## 2016-11-11 DIAGNOSIS — Z833 Family history of diabetes mellitus: Secondary | ICD-10-CM | POA: Diagnosis not present

## 2016-11-11 DIAGNOSIS — J479 Bronchiectasis, uncomplicated: Secondary | ICD-10-CM | POA: Diagnosis not present

## 2016-11-11 DIAGNOSIS — M1990 Unspecified osteoarthritis, unspecified site: Secondary | ICD-10-CM | POA: Diagnosis not present

## 2016-11-11 DIAGNOSIS — K4090 Unilateral inguinal hernia, without obstruction or gangrene, not specified as recurrent: Secondary | ICD-10-CM | POA: Diagnosis not present

## 2016-11-12 DIAGNOSIS — J449 Chronic obstructive pulmonary disease, unspecified: Secondary | ICD-10-CM | POA: Diagnosis not present

## 2016-11-16 DIAGNOSIS — J479 Bronchiectasis, uncomplicated: Secondary | ICD-10-CM | POA: Diagnosis not present

## 2016-11-16 DIAGNOSIS — J329 Chronic sinusitis, unspecified: Secondary | ICD-10-CM | POA: Diagnosis not present

## 2016-11-16 DIAGNOSIS — J411 Mucopurulent chronic bronchitis: Secondary | ICD-10-CM | POA: Diagnosis not present

## 2016-11-22 DIAGNOSIS — N401 Enlarged prostate with lower urinary tract symptoms: Secondary | ICD-10-CM | POA: Diagnosis not present

## 2016-11-22 DIAGNOSIS — J440 Chronic obstructive pulmonary disease with acute lower respiratory infection: Secondary | ICD-10-CM | POA: Diagnosis not present

## 2016-11-22 DIAGNOSIS — E1165 Type 2 diabetes mellitus with hyperglycemia: Secondary | ICD-10-CM | POA: Diagnosis not present

## 2016-11-22 DIAGNOSIS — K458 Other specified abdominal hernia without obstruction or gangrene: Secondary | ICD-10-CM | POA: Diagnosis not present

## 2016-11-22 DIAGNOSIS — Z6830 Body mass index (BMI) 30.0-30.9, adult: Secondary | ICD-10-CM | POA: Diagnosis not present

## 2016-11-24 DIAGNOSIS — K4090 Unilateral inguinal hernia, without obstruction or gangrene, not specified as recurrent: Secondary | ICD-10-CM | POA: Diagnosis not present

## 2016-11-25 DIAGNOSIS — Z853 Personal history of malignant neoplasm of breast: Secondary | ICD-10-CM | POA: Diagnosis not present

## 2016-11-25 DIAGNOSIS — K219 Gastro-esophageal reflux disease without esophagitis: Secondary | ICD-10-CM | POA: Diagnosis not present

## 2016-11-25 DIAGNOSIS — I10 Essential (primary) hypertension: Secondary | ICD-10-CM | POA: Diagnosis not present

## 2016-11-25 DIAGNOSIS — K4090 Unilateral inguinal hernia, without obstruction or gangrene, not specified as recurrent: Secondary | ICD-10-CM | POA: Diagnosis not present

## 2016-11-25 DIAGNOSIS — J449 Chronic obstructive pulmonary disease, unspecified: Secondary | ICD-10-CM | POA: Diagnosis not present

## 2016-11-25 DIAGNOSIS — Z79899 Other long term (current) drug therapy: Secondary | ICD-10-CM | POA: Diagnosis not present

## 2016-11-25 DIAGNOSIS — Z905 Acquired absence of kidney: Secondary | ICD-10-CM | POA: Diagnosis not present

## 2016-11-25 DIAGNOSIS — F17210 Nicotine dependence, cigarettes, uncomplicated: Secondary | ICD-10-CM | POA: Diagnosis not present

## 2016-11-25 DIAGNOSIS — E119 Type 2 diabetes mellitus without complications: Secondary | ICD-10-CM | POA: Diagnosis not present

## 2016-11-25 DIAGNOSIS — R69 Illness, unspecified: Secondary | ICD-10-CM | POA: Diagnosis not present

## 2016-11-25 DIAGNOSIS — Z85528 Personal history of other malignant neoplasm of kidney: Secondary | ICD-10-CM | POA: Diagnosis not present

## 2016-11-26 DIAGNOSIS — E119 Type 2 diabetes mellitus without complications: Secondary | ICD-10-CM | POA: Diagnosis not present

## 2016-11-26 DIAGNOSIS — E785 Hyperlipidemia, unspecified: Secondary | ICD-10-CM | POA: Diagnosis not present

## 2016-11-26 DIAGNOSIS — K4090 Unilateral inguinal hernia, without obstruction or gangrene, not specified as recurrent: Secondary | ICD-10-CM | POA: Diagnosis not present

## 2016-11-26 DIAGNOSIS — M069 Rheumatoid arthritis, unspecified: Secondary | ICD-10-CM | POA: Diagnosis not present

## 2016-11-26 DIAGNOSIS — I10 Essential (primary) hypertension: Secondary | ICD-10-CM | POA: Diagnosis not present

## 2016-11-30 DIAGNOSIS — Z85528 Personal history of other malignant neoplasm of kidney: Secondary | ICD-10-CM | POA: Diagnosis not present

## 2016-11-30 DIAGNOSIS — E119 Type 2 diabetes mellitus without complications: Secondary | ICD-10-CM | POA: Diagnosis not present

## 2016-11-30 DIAGNOSIS — R109 Unspecified abdominal pain: Secondary | ICD-10-CM | POA: Diagnosis not present

## 2016-11-30 DIAGNOSIS — Z9981 Dependence on supplemental oxygen: Secondary | ICD-10-CM | POA: Diagnosis not present

## 2016-11-30 DIAGNOSIS — K5900 Constipation, unspecified: Secondary | ICD-10-CM | POA: Diagnosis not present

## 2016-11-30 DIAGNOSIS — Z79899 Other long term (current) drug therapy: Secondary | ICD-10-CM | POA: Diagnosis not present

## 2016-11-30 DIAGNOSIS — I10 Essential (primary) hypertension: Secondary | ICD-10-CM | POA: Diagnosis not present

## 2016-11-30 DIAGNOSIS — J440 Chronic obstructive pulmonary disease with acute lower respiratory infection: Secondary | ICD-10-CM | POA: Diagnosis not present

## 2016-11-30 DIAGNOSIS — J189 Pneumonia, unspecified organism: Secondary | ICD-10-CM | POA: Diagnosis not present

## 2016-11-30 DIAGNOSIS — Z7984 Long term (current) use of oral hypoglycemic drugs: Secondary | ICD-10-CM | POA: Diagnosis not present

## 2016-11-30 DIAGNOSIS — Y95 Nosocomial condition: Secondary | ICD-10-CM | POA: Diagnosis not present

## 2016-11-30 DIAGNOSIS — J9811 Atelectasis: Secondary | ICD-10-CM | POA: Diagnosis not present

## 2016-12-02 DIAGNOSIS — J449 Chronic obstructive pulmonary disease, unspecified: Secondary | ICD-10-CM | POA: Diagnosis not present

## 2016-12-12 DIAGNOSIS — J449 Chronic obstructive pulmonary disease, unspecified: Secondary | ICD-10-CM | POA: Diagnosis not present

## 2016-12-14 DIAGNOSIS — J440 Chronic obstructive pulmonary disease with acute lower respiratory infection: Secondary | ICD-10-CM | POA: Diagnosis not present

## 2016-12-14 DIAGNOSIS — Z6829 Body mass index (BMI) 29.0-29.9, adult: Secondary | ICD-10-CM | POA: Diagnosis not present

## 2016-12-14 DIAGNOSIS — E1165 Type 2 diabetes mellitus with hyperglycemia: Secondary | ICD-10-CM | POA: Diagnosis not present

## 2016-12-14 DIAGNOSIS — N401 Enlarged prostate with lower urinary tract symptoms: Secondary | ICD-10-CM | POA: Diagnosis not present

## 2016-12-23 DIAGNOSIS — N189 Chronic kidney disease, unspecified: Secondary | ICD-10-CM | POA: Diagnosis not present

## 2016-12-23 DIAGNOSIS — Z882 Allergy status to sulfonamides status: Secondary | ICD-10-CM | POA: Diagnosis not present

## 2016-12-23 DIAGNOSIS — Z9889 Other specified postprocedural states: Secondary | ICD-10-CM | POA: Diagnosis not present

## 2016-12-23 DIAGNOSIS — K449 Diaphragmatic hernia without obstruction or gangrene: Secondary | ICD-10-CM | POA: Diagnosis not present

## 2016-12-23 DIAGNOSIS — E1122 Type 2 diabetes mellitus with diabetic chronic kidney disease: Secondary | ICD-10-CM | POA: Diagnosis not present

## 2016-12-23 DIAGNOSIS — Z881 Allergy status to other antibiotic agents status: Secondary | ICD-10-CM | POA: Diagnosis not present

## 2016-12-23 DIAGNOSIS — J441 Chronic obstructive pulmonary disease with (acute) exacerbation: Secondary | ICD-10-CM | POA: Diagnosis not present

## 2016-12-23 DIAGNOSIS — Z79899 Other long term (current) drug therapy: Secondary | ICD-10-CM | POA: Diagnosis not present

## 2016-12-23 DIAGNOSIS — Z7951 Long term (current) use of inhaled steroids: Secondary | ICD-10-CM | POA: Diagnosis not present

## 2016-12-23 DIAGNOSIS — N183 Chronic kidney disease, stage 3 (moderate): Secondary | ICD-10-CM | POA: Diagnosis not present

## 2016-12-23 DIAGNOSIS — I129 Hypertensive chronic kidney disease with stage 1 through stage 4 chronic kidney disease, or unspecified chronic kidney disease: Secondary | ICD-10-CM | POA: Diagnosis not present

## 2016-12-23 DIAGNOSIS — J449 Chronic obstructive pulmonary disease, unspecified: Secondary | ICD-10-CM | POA: Diagnosis not present

## 2016-12-24 DIAGNOSIS — E1122 Type 2 diabetes mellitus with diabetic chronic kidney disease: Secondary | ICD-10-CM | POA: Diagnosis not present

## 2016-12-24 DIAGNOSIS — K449 Diaphragmatic hernia without obstruction or gangrene: Secondary | ICD-10-CM | POA: Diagnosis not present

## 2016-12-24 DIAGNOSIS — J441 Chronic obstructive pulmonary disease with (acute) exacerbation: Secondary | ICD-10-CM | POA: Diagnosis not present

## 2016-12-24 DIAGNOSIS — N183 Chronic kidney disease, stage 3 (moderate): Secondary | ICD-10-CM | POA: Diagnosis not present

## 2016-12-28 DIAGNOSIS — J449 Chronic obstructive pulmonary disease, unspecified: Secondary | ICD-10-CM | POA: Diagnosis not present

## 2016-12-30 ENCOUNTER — Other Ambulatory Visit: Payer: Self-pay | Admitting: Cardiology

## 2017-01-05 DIAGNOSIS — Z6830 Body mass index (BMI) 30.0-30.9, adult: Secondary | ICD-10-CM | POA: Diagnosis not present

## 2017-01-05 DIAGNOSIS — J440 Chronic obstructive pulmonary disease with acute lower respiratory infection: Secondary | ICD-10-CM | POA: Diagnosis not present

## 2017-01-12 DIAGNOSIS — J449 Chronic obstructive pulmonary disease, unspecified: Secondary | ICD-10-CM | POA: Diagnosis not present

## 2017-01-17 DIAGNOSIS — J449 Chronic obstructive pulmonary disease, unspecified: Secondary | ICD-10-CM | POA: Diagnosis not present

## 2017-01-18 DIAGNOSIS — J441 Chronic obstructive pulmonary disease with (acute) exacerbation: Secondary | ICD-10-CM | POA: Diagnosis not present

## 2017-01-19 ENCOUNTER — Other Ambulatory Visit: Payer: Self-pay | Admitting: Cardiology

## 2017-01-27 ENCOUNTER — Other Ambulatory Visit: Payer: Self-pay | Admitting: Cardiology

## 2017-02-11 DIAGNOSIS — J449 Chronic obstructive pulmonary disease, unspecified: Secondary | ICD-10-CM | POA: Diagnosis not present

## 2017-02-24 ENCOUNTER — Encounter: Payer: Self-pay | Admitting: *Deleted

## 2017-02-25 ENCOUNTER — Ambulatory Visit: Payer: Medicare HMO | Admitting: Cardiology

## 2017-02-25 NOTE — Progress Notes (Deleted)
Clinical Summary Mr. Radi is a 76 y.o.male seen today for follow up of the following medical problems.   1. Chest pain - long history of atypical chest pain - cath 2013 with nonobstructive disease - 12/2014 exercise MPI with inferior scar, no current ischemia.  - nuclear stress morehead 04/2016: no specific ischemic changes by EKG. Inferior scar without ischemia, LVEF 41%, enlarged LV. Labeled high risk due to decreased LVEF.    - chest pain unchanged since last visit. Occasional cough and wheezing. Previously better with prednisone.   2. COPD - tobacco x 20 years, quit 40 years ago.  - followed by Dr Koleen Nimrod  3. Chronic systolic HF - LVEF previously as low as 35-40%, repeat echo showed improvement to 45-50% in 04/2013. He has had a prior cath 11/2011 that showed non-obstructive disease  - since last visit completed 05/2016 echo LVEF 29-51%, grade I diastolic dysfunction   4. HTN - he is compliant with meds   5. HL - muscle aches on statin prevoiusly      Has 6 kids, 17 grands.  Past Medical History:  Diagnosis Date  . Asthma   . Chronic renal insufficiency   . COPD (chronic obstructive pulmonary disease) ([place])   . Hypertension   . Renal insufficiency   . Type 2 diabetes mellitus (HCC)      Allergies  Allergen Reactions  . Amoxicillin     Makes eyes turn yellow and burn  . Ciprofloxacin     Pt not sure     Current Outpatient Prescriptions  Medication Sig Dispense Refill  . albuterol (PROAIR HFA) 108 (90 BASE) MCG/ACT inhaler Inhale 2 puffs into the lungs every 6 (six) hours as needed.    [name] amLODipine (NORVASC) 5 MG tablet TAKE 1 TABLET BY MOUTH EVERY DAY 30 tablet 6  . aspirin EC 81 MG tablet Take 81 mg by mouth daily.    [name] ipratropium-albuterol (DUONEB) 0.5-2.5 (3) MG/3ML SOLN Take 3 mLs by nebulization every 6 (six) hours as needed.    [name] losartan (COZAAR) 50 MG tablet TAKE 1 TABLET BY MOUTH TWICE DAILY 60 tablet 6  . metformin  (FORTAMET) 1000 MG (OSM) 24 hr tablet Take 1 tablet by mouth 2 (two) times daily.    . montelukast (SINGULAIR) 10 MG tablet Take 10 mg by mouth at bedtime.    . Omega-3 Fatty Acids (FISH OIL) 1000 MG CAPS Take 1 capsule by mouth 3 (three) times daily.     [name] VIAGRA 100 MG tablet TAKE 1 TABLET BY MOUTH DAILY AS NEEDED FOR ERECTILE DYSFUNCTION. 10 tablet 0  . vitamin E 400 UNIT capsule Take 400 Units by mouth daily.     No current facility-administered medications for this visit.      Past Surgical History:  Procedure Laterality Date  . APPENDECTOMY    . CHOLECYSTECTOMY    . NASAL SINUS SURGERY    . NEPHRECTOMY     RIGHT (Question CA.  No further therapy needed.)     Allergies  Allergen Reactions  . Amoxicillin     Makes eyes turn yellow and burn  . Ciprofloxacin     Pt not sure      Family History  Problem Relation Age of Onset  . Lung cancer Brother   . Stroke Mother   . Breast cancer Mother      Social History Mr. Mangiaracina reports that he quit smoking about 40 years ago. His smoking use included Cigarettes. He  started smoking about 59 years ago. He has a 15.00 pack-year smoking history. He quit smokeless tobacco use about 11 years ago. His smokeless tobacco use included Chew. Mr. Karstens has no alcohol history on file.   Review of Systems CONSTITUTIONAL: No weight loss, fever, chills, weakness or fatigue.  HEENT: Eyes: No visual loss, blurred vision, double vision or yellow sclerae.No hearing loss, sneezing, congestion, runny nose or sore throat.  SKIN: No rash or itching.  CARDIOVASCULAR:  RESPIRATORY: No shortness of breath, cough or sputum.  GASTROINTESTINAL: No anorexia, nausea, vomiting or diarrhea. No abdominal pain or blood.  GENITOURINARY: No burning on urination, no polyuria NEUROLOGICAL: No headache, dizziness, syncope, paralysis, ataxia, numbness or tingling in the extremities. No change in bowel or bladder control.  MUSCULOSKELETAL: No muscle, back  pain, joint pain or stiffness.  LYMPHATICS: No enlarged nodes. No history of splenectomy.  PSYCHIATRIC: No history of depression or anxiety.  ENDOCRINOLOGIC: No reports of sweating, cold or heat intolerance. No polyuria or polydipsia.  [name]   Physical Examination There were no vitals filed for this visit. There were no vitals filed for this visit.  Gen: resting comfortably, no acute distress HEENT: no scleral icterus, pupils equal round and reactive, no palptable cervical adenopathy,  CV Resp: Clear to auscultation bilaterally GI: abdomen is soft, non-tender, non-distended, normal bowel sounds, no hepatosplenomegaly MSK: extremities are warm, no edema.  Skin: warm, no rash Neuro:  no focal deficits Psych: appropriate affect   Diagnostic Studies 04/2013 echo Study Conclusions  - Left ventricle: The cavity size was mildly dilated. Wall thickness was increased in a pattern of mild to moderateLVH. Systolic function was mildly reduced. The estimated ejection fraction was in the range of 45% to 50%. There is mild global hypokinesis. There was an increased relative contribution of atrial contraction to ventricular filling. Doppler parameters are consistent with abnormal left ventricular relaxation (grade 1 diastolic dysfunction). Doppler parameters are consistent with high ventricular filling pressure. - Mitral valve: Calcified annulus. Mildly thickened leaflets . Trivial to mild regurgitation. - Left atrium: The atrium was mildly dilated. - Right ventricle: The cavity size was mildly dilated. Wall thickness was normal. Systolic function was mildly reduced. - Right atrium: The atrium was mildly dilated. - Pericardium, extracardiac: A trivial pericardial effusion was identified.   11/2011 Cath Procedural  Findings:  Hemodynamics:  AO157/77 LV154/22  Coronary angiography:  Coronary dominance: Right  Left mainstem: Normal  Left anterior descending (LAD): Large vessel wrapping the apex. Mild lumina irregularities. D1 small normal. D2 small normal.  Left circumflex (LCx): AV group luminal irregularities. OM1 large and normal. PL x 2 small normal  Right coronary artery (RCA): Dominant. Long mid 25%. PDA normal. PL moderate and normal.   Left ventriculography: Left ventricle not injected  Final Conclusions: Mild coronary plaque. Non ischemic cardiomyopathy  Recommendations: Medical management.     12/2014 Exercise Cardiolite IMPRESSION: 1. Moderate size region of inferior wall myocardial scar. No ischemic territories seen. Overlying soft tissue attenuation artifact cannot entirely be ruled out.  2. Mild inferior wall hypokinesis.  3. Left ventricular ejection fraction 48%  4. Low to intermediate-risk stress test findings*.   05/2016 echo Study Conclusions  - Left ventricle: The cavity size was normal. Wall thickness was increased in a pattern of mild LVH. Systolic function was normal. The estimated ejection fraction was in the range of 55% to 60%. Wall motion was normal; there were no regional wall motion abnormalities. Doppler parameters are consistent with abnormal left ventricular relaxation (  grade 1 diastolic dysfunction). - Aortic valve: Mildly calcified annulus. Trileaflet; mildly thickened leaflets. Valve area (VTI): 4.03 cm^2. Valve area (Vmax): 4.25 cm^2. Valve area (Vmean): 4.49 cm^2. - Left atrium: The atrium was mildly to moderately dilated. - Atrial septum: No defect or patent foramen ovale was identified. - Technically adequate study.  09/2016 AAA [place] - no aneurysm   Assessment and Plan  1. Chest  pain - atypical symptoms, have improved with nebulizers and steroids in the past - multiple nuclear stress test dating back at least to 2013 have shown inferior scar. Most recent test 04/2016 at [facility] with similar findings. Cath 2013 with nonobstructive disease.  -no plans for further ischemic testing at this time  2. COPD - followed by Dr Amanda Pea and Dr Koleen Nimrod - he is interested about seeing pulmonary in [place], we will refer to Dr Luan Pulling.   3. Chronic systolic heart failure - no recent symptoms. Echo shows that LVEF as now noramlized - continue current meds  4. HTN - he is at goal, we willcontinue current meds  5.AAA screen - male over 38 with smoking history, order AAA screening US.    Request labs from pcp      Arnoldo Lenis, M.D.

## 2017-03-01 DIAGNOSIS — J479 Bronchiectasis, uncomplicated: Secondary | ICD-10-CM | POA: Diagnosis not present

## 2017-03-01 DIAGNOSIS — J471 Bronchiectasis with (acute) exacerbation: Secondary | ICD-10-CM | POA: Diagnosis not present

## 2017-03-08 DIAGNOSIS — J449 Chronic obstructive pulmonary disease, unspecified: Secondary | ICD-10-CM | POA: Diagnosis not present

## 2017-03-10 DIAGNOSIS — J449 Chronic obstructive pulmonary disease, unspecified: Secondary | ICD-10-CM | POA: Diagnosis not present

## 2017-03-14 DIAGNOSIS — Z79899 Other long term (current) drug therapy: Secondary | ICD-10-CM | POA: Diagnosis not present

## 2017-03-14 DIAGNOSIS — M545 Low back pain: Secondary | ICD-10-CM | POA: Diagnosis not present

## 2017-03-14 DIAGNOSIS — J449 Chronic obstructive pulmonary disease, unspecified: Secondary | ICD-10-CM | POA: Diagnosis not present

## 2017-03-14 DIAGNOSIS — Z6830 Body mass index (BMI) 30.0-30.9, adult: Secondary | ICD-10-CM | POA: Diagnosis not present

## 2017-03-14 DIAGNOSIS — E1165 Type 2 diabetes mellitus with hyperglycemia: Secondary | ICD-10-CM | POA: Diagnosis not present

## 2017-03-14 DIAGNOSIS — J440 Chronic obstructive pulmonary disease with acute lower respiratory infection: Secondary | ICD-10-CM | POA: Diagnosis not present

## 2017-04-07 DIAGNOSIS — J449 Chronic obstructive pulmonary disease, unspecified: Secondary | ICD-10-CM | POA: Diagnosis not present

## 2017-04-08 DIAGNOSIS — J449 Chronic obstructive pulmonary disease, unspecified: Secondary | ICD-10-CM | POA: Diagnosis not present

## 2017-04-14 DIAGNOSIS — J449 Chronic obstructive pulmonary disease, unspecified: Secondary | ICD-10-CM | POA: Diagnosis not present

## 2017-05-05 ENCOUNTER — Other Ambulatory Visit: Payer: Self-pay | Admitting: Cardiology

## 2017-05-06 DIAGNOSIS — J449 Chronic obstructive pulmonary disease, unspecified: Secondary | ICD-10-CM | POA: Diagnosis not present

## 2017-05-13 DIAGNOSIS — Z6830 Body mass index (BMI) 30.0-30.9, adult: Secondary | ICD-10-CM | POA: Diagnosis not present

## 2017-05-13 DIAGNOSIS — J440 Chronic obstructive pulmonary disease with acute lower respiratory infection: Secondary | ICD-10-CM | POA: Diagnosis not present

## 2017-05-14 DIAGNOSIS — J449 Chronic obstructive pulmonary disease, unspecified: Secondary | ICD-10-CM | POA: Diagnosis not present

## 2017-05-18 DIAGNOSIS — R69 Illness, unspecified: Secondary | ICD-10-CM | POA: Diagnosis not present

## 2017-05-31 DIAGNOSIS — K5909 Other constipation: Secondary | ICD-10-CM | POA: Diagnosis not present

## 2017-05-31 DIAGNOSIS — R05 Cough: Secondary | ICD-10-CM | POA: Diagnosis not present

## 2017-05-31 DIAGNOSIS — R0781 Pleurodynia: Secondary | ICD-10-CM | POA: Diagnosis not present

## 2017-05-31 DIAGNOSIS — J0110 Acute frontal sinusitis, unspecified: Secondary | ICD-10-CM | POA: Diagnosis not present

## 2017-05-31 DIAGNOSIS — R062 Wheezing: Secondary | ICD-10-CM | POA: Diagnosis not present

## 2017-05-31 DIAGNOSIS — R69 Illness, unspecified: Secondary | ICD-10-CM | POA: Diagnosis not present

## 2017-06-07 DIAGNOSIS — J449 Chronic obstructive pulmonary disease, unspecified: Secondary | ICD-10-CM | POA: Diagnosis not present

## 2017-06-08 DIAGNOSIS — J479 Bronchiectasis, uncomplicated: Secondary | ICD-10-CM | POA: Diagnosis not present

## 2017-06-08 DIAGNOSIS — R062 Wheezing: Secondary | ICD-10-CM | POA: Diagnosis not present

## 2017-06-08 DIAGNOSIS — J441 Chronic obstructive pulmonary disease with (acute) exacerbation: Secondary | ICD-10-CM | POA: Diagnosis not present

## 2017-06-08 DIAGNOSIS — R0789 Other chest pain: Secondary | ICD-10-CM | POA: Diagnosis not present

## 2017-06-08 DIAGNOSIS — Z87891 Personal history of nicotine dependence: Secondary | ICD-10-CM | POA: Diagnosis not present

## 2017-06-08 DIAGNOSIS — R05 Cough: Secondary | ICD-10-CM | POA: Diagnosis not present

## 2017-06-13 ENCOUNTER — Telehealth: Payer: Self-pay | Admitting: *Deleted

## 2017-06-13 NOTE — Telephone Encounter (Signed)
Patient walked in with c/o chest pain 5/10 currently.  Did see his pmd 2 weeks ago & was put on antibiotic x 5 days for congestion.  Said chest x-ray was done & did not show any pneumonia.  Also, stated he saw his lung doctor last week.  Per review of chart - did show long standing hx of atypical chest pain & COPD.  Suggested that he call his pmd to see if they feel recheck is warranted since he was just on ABO's.  OV scheduled with KL in our [place] office for this Friday.  Advised patient that if symptoms worsened to go to ED for evaluation.  Patient verbalized understanding.

## 2017-06-14 DIAGNOSIS — J449 Chronic obstructive pulmonary disease, unspecified: Secondary | ICD-10-CM | POA: Diagnosis not present

## 2017-06-17 ENCOUNTER — Ambulatory Visit: Payer: Medicare HMO | Admitting: Adult Health

## 2017-06-20 DIAGNOSIS — R05 Cough: Secondary | ICD-10-CM | POA: Diagnosis not present

## 2017-06-20 DIAGNOSIS — Z85528 Personal history of other malignant neoplasm of kidney: Secondary | ICD-10-CM | POA: Diagnosis not present

## 2017-06-20 DIAGNOSIS — J441 Chronic obstructive pulmonary disease with (acute) exacerbation: Secondary | ICD-10-CM | POA: Diagnosis not present

## 2017-06-20 DIAGNOSIS — Z87891 Personal history of nicotine dependence: Secondary | ICD-10-CM | POA: Diagnosis not present

## 2017-06-20 DIAGNOSIS — Z9981 Dependence on supplemental oxygen: Secondary | ICD-10-CM | POA: Diagnosis not present

## 2017-06-20 DIAGNOSIS — I10 Essential (primary) hypertension: Secondary | ICD-10-CM | POA: Diagnosis not present

## 2017-06-20 DIAGNOSIS — N400 Enlarged prostate without lower urinary tract symptoms: Secondary | ICD-10-CM | POA: Diagnosis not present

## 2017-06-20 DIAGNOSIS — R079 Chest pain, unspecified: Secondary | ICD-10-CM | POA: Diagnosis not present

## 2017-06-20 DIAGNOSIS — Z79899 Other long term (current) drug therapy: Secondary | ICD-10-CM | POA: Diagnosis not present

## 2017-06-27 DIAGNOSIS — J441 Chronic obstructive pulmonary disease with (acute) exacerbation: Secondary | ICD-10-CM | POA: Diagnosis not present

## 2017-06-27 DIAGNOSIS — Z6830 Body mass index (BMI) 30.0-30.9, adult: Secondary | ICD-10-CM | POA: Diagnosis not present

## 2017-07-05 DIAGNOSIS — J449 Chronic obstructive pulmonary disease, unspecified: Secondary | ICD-10-CM | POA: Diagnosis not present

## 2017-07-13 ENCOUNTER — Ambulatory Visit: Payer: Medicare HMO | Admitting: Cardiology

## 2017-07-13 NOTE — Progress Notes (Deleted)
Clinical Summary Mr. Sibley is a 76 y.o.male seen today for follow up of the following medical problems.   1. Chest pain - long history of atypical chest pain - cath 2013 with nonobstructive disease - 12/2014 exercise MPI with inferior scar, no current ischemia.  - nuclear stress morehead 04/2016: no specific ischemic changes by EKG. Inferior scar without ischemia, LVEF 41%, enlarged LV. Labeled high risk due to decreased LVEF.    - chest pain unchanged since last visit. Occasional cough and wheezing. Previously better with prednisone.   2. COPD - tobacco x 20 years, quit 40 years ago.  - followed by Dr Koleen Nimrod  3. Chronic systolic HF - LVEF previously as low as 35-40%, repeat echo showed improvement to 45-50% in 04/2013. He has had a prior cath 11/2011 that showed non-obstructive disease  - since last visit completed 05/2016 echo LVEF 23-76%, grade I diastolic dysfunction   4. HTN - he is compliant with meds   5. HL - muscle aches on statin prevoiusly    Has 6 kids, 17 grands.    Past Medical History:  Diagnosis Date  . Asthma   . Chronic renal insufficiency   . COPD (chronic obstructive pulmonary disease) ([place])   . Hypertension   . Renal insufficiency   . Type 2 diabetes mellitus (HCC)      Allergies  Allergen Reactions  . Amoxicillin     Makes eyes turn yellow and burn  . Ciprofloxacin     Pt not sure     Current Outpatient Medications  Medication Sig Dispense Refill  . albuterol (PROAIR HFA) 108 (90 BASE) MCG/ACT inhaler Inhale 2 puffs into the lungs every 6 (six) hours as needed.    [name] amLODipine (NORVASC) 5 MG tablet TAKE 1 TABLET BY MOUTH EVERY DAY 30 tablet 6  . aspirin EC 81 MG tablet Take 81 mg by mouth daily.    [name] ipratropium-albuterol (DUONEB) 0.5-2.5 (3) MG/3ML SOLN Take 3 mLs by nebulization every 6 (six) hours as needed.    [name] losartan (COZAAR) 50 MG tablet TAKE 1 TABLET BY MOUTH TWICE DAILY 60 tablet 6  . metformin  (FORTAMET) 1000 MG (OSM) 24 hr tablet Take 1 tablet by mouth 2 (two) times daily.    . montelukast (SINGULAIR) 10 MG tablet Take 10 mg by mouth at bedtime.    . Omega-3 Fatty Acids (FISH OIL) 1000 MG CAPS Take 1 capsule by mouth 3 (three) times daily.     . sildenafil (VIAGRA) 100 MG tablet TAKE 1 TABLET BY MOUTH EVERY DAY AS NEEDED FOR ERECTILE DYSFUNCTION 10 tablet 3  . VIAGRA 100 MG tablet TAKE 1 TABLET BY MOUTH DAILY AS NEEDED FOR ERECTILE DYSFUNCTION. 10 tablet 0  . vitamin E 400 UNIT capsule Take 400 Units by mouth daily.     No current facility-administered medications for this visit.      Past Surgical History:  Procedure Laterality Date  . APPENDECTOMY    . CHOLECYSTECTOMY    . NASAL SINUS SURGERY    . NEPHRECTOMY     RIGHT (Question CA.  No further therapy needed.)     Allergies  Allergen Reactions  . Amoxicillin     Makes eyes turn yellow and burn  . Ciprofloxacin     Pt not sure      Family History  Problem Relation Age of Onset  . Lung cancer Brother   . Stroke Mother   . Breast cancer Mother  Social History Mr. Kemler reports that he quit smoking about 40 years ago. His smoking use included cigarettes. He started smoking about 60 years ago. He has a 15.00 pack-year smoking history. He quit smokeless tobacco use about 11 years ago. His smokeless tobacco use included chew. Mr. Vert has no alcohol history on file.   Review of Systems CONSTITUTIONAL: No weight loss, fever, chills, weakness or fatigue.  HEENT: Eyes: No visual loss, blurred vision, double vision or yellow sclerae.No hearing loss, sneezing, congestion, runny nose or sore throat.  SKIN: No rash or itching.  CARDIOVASCULAR:  RESPIRATORY: No shortness of breath, cough or sputum.  GASTROINTESTINAL: No anorexia, nausea, vomiting or diarrhea. No abdominal pain or blood.  GENITOURINARY: No burning on urination, no polyuria NEUROLOGICAL: No headache, dizziness, syncope, paralysis, ataxia,  numbness or tingling in the extremities. No change in bowel or bladder control.  MUSCULOSKELETAL: No muscle, back pain, joint pain or stiffness.  LYMPHATICS: No enlarged nodes. No history of splenectomy.  PSYCHIATRIC: No history of depression or anxiety.  ENDOCRINOLOGIC: No reports of sweating, cold or heat intolerance. No polyuria or polydipsia.  [name]   Physical Examination There were no vitals filed for this visit. There were no vitals filed for this visit.  Gen: resting comfortably, no acute distress HEENT: no scleral icterus, pupils equal round and reactive, no palptable cervical adenopathy,  CV Resp: Clear to auscultation bilaterally GI: abdomen is soft, non-tender, non-distended, normal bowel sounds, no hepatosplenomegaly MSK: extremities are warm, no edema.  Skin: warm, no rash Neuro:  no focal deficits Psych: appropriate affect   Diagnostic Studies 04/2013 echo Study Conclusions  - Left ventricle: The cavity size was mildly dilated. Wall thickness was increased in a pattern of mild to moderateLVH. Systolic function was mildly reduced. The estimated ejection fraction was in the range of 45% to 50%. There is mild global hypokinesis. There was an increased relative contribution of atrial contraction to ventricular filling. Doppler parameters are consistent with abnormal left ventricular relaxation (grade 1 diastolic dysfunction). Doppler parameters are consistent with high ventricular filling pressure. - Mitral valve: Calcified annulus. Mildly thickened leaflets . Trivial to mild regurgitation. - Left atrium: The atrium was mildly dilated. - Right ventricle: The cavity size was mildly dilated. Wall thickness was normal. Systolic function was mildly reduced. - Right atrium: The atrium was mildly dilated. - Pericardium, extracardiac: A trivial pericardial effusion was identified.   11/2011 Cath Procedural  Findings:  Hemodynamics:  AO157/77 LV154/22  Coronary angiography:  Coronary dominance: Right  Left mainstem: Normal  Left anterior descending (LAD): Large vessel wrapping the apex. Mild lumina irregularities. D1 small normal. D2 small normal.  Left circumflex (LCx): AV group luminal irregularities. OM1 large and normal. PL x 2 small normal  Right coronary artery (RCA): Dominant. Long mid 25%. PDA normal. PL moderate and normal.   Left ventriculography: Left ventricle not injected  Final Conclusions: Mild coronary plaque. Non ischemic cardiomyopathy  Recommendations: Medical management.     12/2014 Exercise Cardiolite IMPRESSION: 1. Moderate size region of inferior wall myocardial scar. No ischemic territories seen. Overlying soft tissue attenuation artifact cannot entirely be ruled out.  2. Mild inferior wall hypokinesis.  3. Left ventricular ejection fraction 48%  4. Low to intermediate-risk stress test findings*.   05/2016 echo Study Conclusions  - Left ventricle: The cavity size was normal. Wall thickness was increased in a pattern of mild LVH. Systolic function was normal. The estimated ejection fraction was in the range of 55% to 60%. Wall  motion was normal; there were no regional wall motion abnormalities. Doppler parameters are consistent with abnormal left ventricular relaxation (grade 1 diastolic dysfunction). - Aortic valve: Mildly calcified annulus. Trileaflet; mildly thickened leaflets. Valve area (VTI): 4.03 cm^2. Valve area (Vmax): 4.25 cm^2. Valve area (Vmean): 4.49 cm^2. - Left atrium: The atrium was mildly to moderately dilated. - Atrial septum: No defect or patent foramen ovale was identified. - Technically adequate study.    Assessment and Plan  1. Chest pain - atypical symptoms, have  improved with nebulizers and steroids in the past - multiple nuclear stress test dating back at least to 2013 have shown inferior scar. Most recent test 04/2016 at [facility] with similar findings. Cath 2013 with nonobstructive disease.  -no plans for further ischemic testing at this time  2. COPD - followed by Dr Amanda Pea and Dr Koleen Nimrod - he is interested about seeing pulmonary in [place], we will refer to Dr Luan Pulling.   3. Chronic systolic heart failure - no recent symptoms. Echo shows that LVEF as now noramlized - continue current meds  4. HTN - he is at goal, we willcontinue current meds  5.AAA screen - male over 8 with smoking history, order AAA screening US.        Arnoldo Lenis, M.D., F.A.C.C.

## 2017-07-14 DIAGNOSIS — J449 Chronic obstructive pulmonary disease, unspecified: Secondary | ICD-10-CM | POA: Diagnosis not present

## 2017-07-25 DIAGNOSIS — R05 Cough: Secondary | ICD-10-CM | POA: Diagnosis not present

## 2017-07-25 DIAGNOSIS — J441 Chronic obstructive pulmonary disease with (acute) exacerbation: Secondary | ICD-10-CM | POA: Diagnosis not present

## 2017-07-27 DIAGNOSIS — J449 Chronic obstructive pulmonary disease, unspecified: Secondary | ICD-10-CM | POA: Diagnosis not present

## 2017-08-03 DIAGNOSIS — J069 Acute upper respiratory infection, unspecified: Secondary | ICD-10-CM | POA: Diagnosis not present

## 2017-08-03 DIAGNOSIS — R0789 Other chest pain: Secondary | ICD-10-CM | POA: Diagnosis not present

## 2017-08-03 DIAGNOSIS — Z9981 Dependence on supplemental oxygen: Secondary | ICD-10-CM | POA: Diagnosis not present

## 2017-08-03 DIAGNOSIS — R05 Cough: Secondary | ICD-10-CM | POA: Diagnosis not present

## 2017-08-03 DIAGNOSIS — J479 Bronchiectasis, uncomplicated: Secondary | ICD-10-CM | POA: Diagnosis not present

## 2017-08-03 DIAGNOSIS — J449 Chronic obstructive pulmonary disease, unspecified: Secondary | ICD-10-CM | POA: Diagnosis not present

## 2017-08-11 DIAGNOSIS — Z79899 Other long term (current) drug therapy: Secondary | ICD-10-CM | POA: Diagnosis not present

## 2017-08-11 DIAGNOSIS — Z87891 Personal history of nicotine dependence: Secondary | ICD-10-CM | POA: Diagnosis not present

## 2017-08-11 DIAGNOSIS — J440 Chronic obstructive pulmonary disease with acute lower respiratory infection: Secondary | ICD-10-CM | POA: Diagnosis not present

## 2017-08-11 DIAGNOSIS — K219 Gastro-esophageal reflux disease without esophagitis: Secondary | ICD-10-CM | POA: Diagnosis not present

## 2017-08-11 DIAGNOSIS — J209 Acute bronchitis, unspecified: Secondary | ICD-10-CM | POA: Diagnosis not present

## 2017-08-11 DIAGNOSIS — Z7984 Long term (current) use of oral hypoglycemic drugs: Secondary | ICD-10-CM | POA: Diagnosis not present

## 2017-08-11 DIAGNOSIS — R05 Cough: Secondary | ICD-10-CM | POA: Diagnosis not present

## 2017-08-11 DIAGNOSIS — E7800 Pure hypercholesterolemia, unspecified: Secondary | ICD-10-CM | POA: Diagnosis not present

## 2017-08-11 DIAGNOSIS — Z85528 Personal history of other malignant neoplasm of kidney: Secondary | ICD-10-CM | POA: Diagnosis not present

## 2017-08-11 DIAGNOSIS — E119 Type 2 diabetes mellitus without complications: Secondary | ICD-10-CM | POA: Diagnosis not present

## 2017-08-11 DIAGNOSIS — I10 Essential (primary) hypertension: Secondary | ICD-10-CM | POA: Diagnosis not present

## 2017-08-11 DIAGNOSIS — Z905 Acquired absence of kidney: Secondary | ICD-10-CM | POA: Diagnosis not present

## 2017-08-14 DIAGNOSIS — J449 Chronic obstructive pulmonary disease, unspecified: Secondary | ICD-10-CM | POA: Diagnosis not present

## 2017-08-16 DIAGNOSIS — J449 Chronic obstructive pulmonary disease, unspecified: Secondary | ICD-10-CM | POA: Diagnosis not present

## 2017-08-17 DIAGNOSIS — J449 Chronic obstructive pulmonary disease, unspecified: Secondary | ICD-10-CM | POA: Diagnosis not present

## 2017-09-02 DIAGNOSIS — J189 Pneumonia, unspecified organism: Secondary | ICD-10-CM | POA: Diagnosis not present

## 2017-09-02 DIAGNOSIS — K5900 Constipation, unspecified: Secondary | ICD-10-CM | POA: Diagnosis not present

## 2017-09-02 DIAGNOSIS — R0989 Other specified symptoms and signs involving the circulatory and respiratory systems: Secondary | ICD-10-CM | POA: Diagnosis not present

## 2017-09-02 DIAGNOSIS — R6883 Chills (without fever): Secondary | ICD-10-CM | POA: Diagnosis not present

## 2017-09-12 ENCOUNTER — Encounter: Payer: Self-pay | Admitting: *Deleted

## 2017-09-12 ENCOUNTER — Ambulatory Visit: Payer: Medicare HMO | Admitting: Cardiology

## 2017-09-12 ENCOUNTER — Encounter: Payer: Self-pay | Admitting: Cardiology

## 2017-09-12 VITALS — BP 126/60 | HR 95 | Ht 66.0 in | Wt 196.4 lb

## 2017-09-12 DIAGNOSIS — R079 Chest pain, unspecified: Secondary | ICD-10-CM

## 2017-09-12 DIAGNOSIS — I10 Essential (primary) hypertension: Secondary | ICD-10-CM

## 2017-09-12 DIAGNOSIS — J449 Chronic obstructive pulmonary disease, unspecified: Secondary | ICD-10-CM | POA: Diagnosis not present

## 2017-09-12 MED ORDER — AZITHROMYCIN 250 MG PO TABS: ORAL_TABLET | ORAL | 0 refills | Status: DC

## 2017-09-12 MED ORDER — FUROSEMIDE 20 MG PO TABS: ORAL_TABLET | ORAL | 1 refills | Status: DC

## 2017-09-12 MED ORDER — PREDNISONE 20 MG PO TABS: 40.0000 mg | ORAL_TABLET | Freq: Every day | ORAL | 0 refills | Status: DC

## 2017-09-12 NOTE — Patient Instructions (Signed)
Your physician wants you to follow-up in: [place] will receive a reminder letter in the mail two months in advance. If you don't receive a letter, please call our office to schedule the follow-up appointment.  Your physician has recommended you make the following change in your medication:   START LASIX 20 MG DAILY AS NEEDED FOR SWELLING  START PREDNISONE 40 MG DAILY FOR 4 DAYS  START AZITHROMYCIN 500 MG DAY ONE THEN TAKE 250 MG DAILY FOR 4 DAYS AFTER  Thank you for choosing [facility]!!

## 2017-09-12 NOTE — Progress Notes (Signed)
Clinical Summary Gregory Mitchell is a 77 y.o.male seen today for follow up of the following medical problems.   1. Chest pain - long history of atypical chest pain - cath 2013 with nonobstructive disease - 12/2014 exercise MPI with inferior scar, no current ischemia.  - nuclear stress [place]head 04/2016: no specific ischemic changes by EKG. Inferior scar without ischemia, LVEF 41%, enlarged LV. Labeled high risk due to decreased LVEF.    - recent chest pain symptoms. Worst in morning when waking up. Dull pain left rib cage, 6/10 in severity. Lasts for a few minutes, only occurs in moring - different pain in midchest throughout day, feeling of heaviness. +cough +wheezing  2. COPD - tobacco x 20 years, quit 40 years ago.  - followed by Dr Koleen Nimrod. Seen by Dr Luan Pulling   - productive cough with yellow phlegm recently  3. Chronic systolic HF - LVEF previously as low as 35-40%, repeat echo showed improvement to 45-50% in 04/2013. He has had a prior cath 11/2011 that showed non-obstructive disease - 05/2016 echo LVEF 16-10%, grade I diastolic dysfunction - no recent leg edema   4. HTN -he is compliant with meds   5. HL - muscle aches on statin prevoiusly    Has 6 kids, 17 grands.    Past Medical History:  Diagnosis Date  . Asthma   . Chronic renal insufficiency   . COPD (chronic obstructive pulmonary disease) ([place])   . Hypertension   . Renal insufficiency   . Type 2 diabetes mellitus (H[facility])      Allergies  Allergen Reactions  . Amoxicillin     Makes eyes turn yellow and burn  . Ciprofloxacin     Pt not sure     Current Outpatient Medications  Medication Sig Dispense Refill  . albuterol (PROAIR HFA) 108 (90 BASE) MCG/ACT inhaler Inhale 2 puffs into the lungs every 6 (six) hours as needed.    [name] amLODipine (NORVASC) 5 MG tablet TAKE 1 TABLET BY MOUTH EVERY DAY 30 tablet 6  . aspirin EC 81 MG tablet Take 81 mg by mouth daily.    [name] ipratropium-albuterol  (DUONEB) 0.5-2.5 (3) MG/3ML SOLN Take 3 mLs by nebulization every 6 (six) hours as needed.    [name] losartan (COZAAR) 50 MG tablet TAKE 1 TABLET BY MOUTH TWICE DAILY 60 tablet 6  . metformin (FORTAMET) 1000 MG (OSM) 24 hr tablet Take 1 tablet by mouth 2 (two) times daily.    . montelukast (SINGULAIR) 10 MG tablet Take 10 mg by mouth at bedtime.    . Omega-3 Fatty Acids (FISH OIL) 1000 MG CAPS Take 1 capsule by mouth 3 (three) times daily.     . sildenafil (VIAGRA) 100 MG tablet TAKE 1 TABLET BY MOUTH EVERY DAY AS NEEDED FOR ERECTILE DYSFUNCTION 10 tablet 3  . VIAGRA 100 MG tablet TAKE 1 TABLET BY MOUTH DAILY AS NEEDED FOR ERECTILE DYSFUNCTION. 10 tablet 0  . vitamin E 400 UNIT capsule Take 400 Units by mouth daily.     No current facility-administered medications for this visit.      Past Surgical History:  Procedure Laterality Date  . APPENDECTOMY    . CHOLECYSTECTOMY    . NASAL SINUS SURGERY    . NEPHRECTOMY     RIGHT (Question CA.  No further therapy needed.)     Allergies  Allergen Reactions  . Amoxicillin     Makes eyes turn yellow and burn  . Ciprofloxacin  Pt not sure      Family History  Problem Relation Age of Onset  . Lung cancer Brother   . Stroke Mother   . Breast cancer Mother      Social History Mr. Eudy reports that he quit smoking about 41 years ago. His smoking use included cigarettes. He started smoking about 60 years ago. He has a 15.00 pack-year smoking history. He quit smokeless tobacco use about 11 years ago. His smokeless tobacco use included chew. Mr. Wiemers has no alcohol history on file.   Review of Systems CONSTITUTIONAL: No weight loss, fever, chills, weakness or fatigue.  HEENT: Eyes: No visual loss, blurred vision, double vision or yellow sclerae.No hearing loss, sneezing, congestion, runny nose or sore throat.  SKIN: No rash or itching.  CARDIOVASCULAR: per hpi RESPIRATORY: per hpi GASTROINTESTINAL: No anorexia, nausea, vomiting  or diarrhea. No abdominal pain or blood.  GENITOURINARY: No burning on urination, no polyuria NEUROLOGICAL: No headache, dizziness, syncope, paralysis, ataxia, numbness or tingling in the extremities. No change in bowel or bladder control.  MUSCULOSKELETAL: No muscle, back pain, joint pain or stiffness.  LYMPHATICS: No enlarged nodes. No history of splenectomy.  PSYCHIATRIC: No history of depression or anxiety.  ENDOCRINOLOGIC: No reports of sweating, cold or heat intolerance. No polyuria or polydipsia.  [name]   Physical Examination Vitals:   09/12/17 1146  BP: 126/60  Pulse: 95  SpO2: 96%   Vitals:   09/12/17 1146  Weight: 196 lb 6.4 oz (89.1 kg)  Height: 5\' 6"  (1.676 m)    Gen: resting comfortably, no acute distress HEENT: no scleral icterus, pupils equal round and reactive, no palptable cervical adenopathy,  CV: RRR, no m/r/g, no jvd Resp: bilateral wheezing GI: abdomen is soft, non-tender, non-distended, normal bowel sounds, no hepatosplenomegaly MSK: extremities are warm, no edema.  Skin: warm, no rash Neuro:  no focal deficits Psych: appropriate affect   Diagnostic Studies 04/2013 echo Study Conclusions  - Left ventricle: The cavity size was mildly dilated. Wall thickness was increased in a pattern of mild to moderateLVH. Systolic function was mildly reduced. The estimated ejection fraction was in the range of 45% to 50%. There is mild global hypokinesis. There was an increased relative contribution of atrial contraction to ventricular filling. Doppler parameters are consistent with abnormal left ventricular relaxation (grade 1 diastolic dysfunction). Doppler parameters are consistent with high ventricular filling pressure. - Mitral valve: Calcified annulus. Mildly thickened leaflets . Trivial to mild regurgitation. - Left atrium: The atrium was mildly dilated. - Right ventricle: The cavity size was mildly dilated. Wall thickness was normal.  Systolic function was mildly reduced. - Right atrium: The atrium was mildly dilated. - Pericardium, extracardiac: A trivial pericardial effusion was identified.   11/2011 Cath Procedural Findings:  Hemodynamics:  AO157/77 LV154/22  Coronary angiography:  Coronary dominance: Right  Left mainstem: Normal  Left anterior descending (LAD): Large vessel wrapping the apex. Mild lumina irregularities. D1 small normal. D2 small normal.  Left circumflex (LCx): AV group luminal irregularities. OM1 large and normal. PL x 2 small normal  Right coronary artery (RCA): Dominant. Long mid 25%. PDA normal. PL moderate and normal.   Left ventriculography: Left ventricle not injected  Final Conclusions: Mild coronary plaque. Non ischemic cardiomyopathy  Recommendations: Medical management.     12/2014 Exercise Cardiolite IMPRESSION: 1. Moderate size region of inferior wall myocardial scar. No ischemic territories seen. Overlying soft tissue attenuation artifact cannot entirely be ruled out.  2. Mild inferior wall hypokinesis.  3. Left ventricular ejection fraction 48%  4. Low to intermediate-risk stress test findings*.   05/2016 echo Study Conclusions  - Left ventricle: The cavity size was normal. Wall thickness was increased in a pattern of mild LVH. Systolic function was normal. The estimated ejection fraction was in the range of 55% to 60%. Wall motion was normal; there were no regional wall motion abnormalities. Doppler parameters are consistent with abnormal left ventricular relaxation (grade 1 diastolic dysfunction). - Aortic valve: Mildly calcified annulus. Trileaflet; mildly thickened leaflets. Valve area (VTI): 4.03 cm^2. Valve area (Vmax): 4.25 cm^2. Valve area (Vmean): 4.49 cm^2. - Left atrium: The atrium was  mildly to moderately dilated. - Atrial septum: No defect or patent foramen ovale was identified. - Technically adequate study.     Assessment and Plan   1. Chest pain - atypical symptoms, have improved with nebulizers and steroids in the past - multiple nuclear stress test dating back at least to 2013 have shown inferior scar. Most recent test 04/2016 at [facility] with similar findings. Cath 2013 with nonobstructive disease.   - current chest pain with worsening COPD symptoms consistent with his chronic symptoms. No plans for repeat ischemic testing at this time.   2. COPD - followed by Dr Amanda Pea and Dr Koleen Nimrod 2015 PFTs very severe airflow limitation, significant imprvement with bronchodilator, hyperinflated lungs -significant symptoms and wheezing on exam, will give 5 day course of azithromycin and prednisone.   3. Chronic systolic heart failure -Echo shows that LVEF has now noramlized - no recent symptoms, continue current meds  4. HTN - bp at goal, continue current meds  5.AAA screen - male over 32 with smoking history, order AAA screening [place].  - 09/2016 [facility] [place] no aneurysm        Arnoldo Lenis, M.D.

## 2017-09-14 DIAGNOSIS — J449 Chronic obstructive pulmonary disease, unspecified: Secondary | ICD-10-CM | POA: Diagnosis not present

## 2017-09-16 ENCOUNTER — Encounter: Payer: Self-pay | Admitting: Cardiology

## 2017-09-27 DIAGNOSIS — Z6831 Body mass index (BMI) 31.0-31.9, adult: Secondary | ICD-10-CM | POA: Diagnosis not present

## 2017-09-27 DIAGNOSIS — D559 Anemia due to enzyme disorder, unspecified: Secondary | ICD-10-CM | POA: Diagnosis not present

## 2017-09-27 DIAGNOSIS — Z0000 Encounter for general adult medical examination without abnormal findings: Secondary | ICD-10-CM | POA: Diagnosis not present

## 2017-09-27 DIAGNOSIS — N401 Enlarged prostate with lower urinary tract symptoms: Secondary | ICD-10-CM | POA: Diagnosis not present

## 2017-09-27 DIAGNOSIS — E1165 Type 2 diabetes mellitus with hyperglycemia: Secondary | ICD-10-CM | POA: Diagnosis not present

## 2017-09-27 DIAGNOSIS — E119 Type 2 diabetes mellitus without complications: Secondary | ICD-10-CM | POA: Diagnosis not present

## 2017-09-27 DIAGNOSIS — Z1389 Encounter for screening for other disorder: Secondary | ICD-10-CM | POA: Diagnosis not present

## 2017-09-27 DIAGNOSIS — I10 Essential (primary) hypertension: Secondary | ICD-10-CM | POA: Diagnosis not present

## 2017-09-27 DIAGNOSIS — J441 Chronic obstructive pulmonary disease with (acute) exacerbation: Secondary | ICD-10-CM | POA: Diagnosis not present

## 2017-09-27 DIAGNOSIS — Z6830 Body mass index (BMI) 30.0-30.9, adult: Secondary | ICD-10-CM | POA: Diagnosis not present

## 2017-10-10 DIAGNOSIS — J449 Chronic obstructive pulmonary disease, unspecified: Secondary | ICD-10-CM | POA: Diagnosis not present

## 2017-10-12 DIAGNOSIS — J449 Chronic obstructive pulmonary disease, unspecified: Secondary | ICD-10-CM | POA: Diagnosis not present

## 2017-10-24 DIAGNOSIS — J479 Bronchiectasis, uncomplicated: Secondary | ICD-10-CM | POA: Diagnosis not present

## 2017-10-24 DIAGNOSIS — R071 Chest pain on breathing: Secondary | ICD-10-CM | POA: Diagnosis not present

## 2017-10-24 DIAGNOSIS — R0989 Other specified symptoms and signs involving the circulatory and respiratory systems: Secondary | ICD-10-CM | POA: Diagnosis not present

## 2017-10-24 DIAGNOSIS — I10 Essential (primary) hypertension: Secondary | ICD-10-CM | POA: Diagnosis not present

## 2017-10-24 DIAGNOSIS — J449 Chronic obstructive pulmonary disease, unspecified: Secondary | ICD-10-CM | POA: Diagnosis not present

## 2017-10-24 DIAGNOSIS — J40 Bronchitis, not specified as acute or chronic: Secondary | ICD-10-CM | POA: Diagnosis not present

## 2017-10-24 DIAGNOSIS — Z881 Allergy status to other antibiotic agents status: Secondary | ICD-10-CM | POA: Diagnosis not present

## 2017-10-24 DIAGNOSIS — R1013 Epigastric pain: Secondary | ICD-10-CM | POA: Diagnosis not present

## 2017-10-24 DIAGNOSIS — R0789 Other chest pain: Secondary | ICD-10-CM | POA: Diagnosis not present

## 2017-10-24 DIAGNOSIS — Z87891 Personal history of nicotine dependence: Secondary | ICD-10-CM | POA: Diagnosis not present

## 2017-10-24 DIAGNOSIS — R079 Chest pain, unspecified: Secondary | ICD-10-CM | POA: Diagnosis not present

## 2017-10-24 DIAGNOSIS — J441 Chronic obstructive pulmonary disease with (acute) exacerbation: Secondary | ICD-10-CM | POA: Diagnosis not present

## 2017-10-24 DIAGNOSIS — E119 Type 2 diabetes mellitus without complications: Secondary | ICD-10-CM | POA: Diagnosis not present

## 2017-11-03 DIAGNOSIS — J449 Chronic obstructive pulmonary disease, unspecified: Secondary | ICD-10-CM | POA: Diagnosis not present

## 2017-11-09 DIAGNOSIS — E119 Type 2 diabetes mellitus without complications: Secondary | ICD-10-CM | POA: Diagnosis not present

## 2017-11-09 DIAGNOSIS — J449 Chronic obstructive pulmonary disease, unspecified: Secondary | ICD-10-CM | POA: Diagnosis not present

## 2017-11-09 DIAGNOSIS — R52 Pain, unspecified: Secondary | ICD-10-CM | POA: Diagnosis not present

## 2017-11-10 ENCOUNTER — Other Ambulatory Visit: Payer: Self-pay | Admitting: Cardiology

## 2017-11-12 DIAGNOSIS — J449 Chronic obstructive pulmonary disease, unspecified: Secondary | ICD-10-CM | POA: Diagnosis not present

## 2017-11-15 DIAGNOSIS — Z13820 Encounter for screening for osteoporosis: Secondary | ICD-10-CM | POA: Diagnosis not present

## 2017-11-15 DIAGNOSIS — M810 Age-related osteoporosis without current pathological fracture: Secondary | ICD-10-CM | POA: Diagnosis not present

## 2017-11-24 DIAGNOSIS — J988 Other specified respiratory disorders: Secondary | ICD-10-CM | POA: Diagnosis not present

## 2017-11-24 DIAGNOSIS — J449 Chronic obstructive pulmonary disease, unspecified: Secondary | ICD-10-CM | POA: Diagnosis not present

## 2017-11-28 DIAGNOSIS — J449 Chronic obstructive pulmonary disease, unspecified: Secondary | ICD-10-CM | POA: Diagnosis not present

## 2017-11-29 DIAGNOSIS — R05 Cough: Secondary | ICD-10-CM | POA: Diagnosis not present

## 2017-11-29 DIAGNOSIS — R350 Frequency of micturition: Secondary | ICD-10-CM | POA: Diagnosis not present

## 2017-11-29 DIAGNOSIS — R062 Wheezing: Secondary | ICD-10-CM | POA: Diagnosis not present

## 2017-11-29 DIAGNOSIS — J471 Bronchiectasis with (acute) exacerbation: Secondary | ICD-10-CM | POA: Diagnosis not present

## 2017-12-09 ENCOUNTER — Other Ambulatory Visit: Payer: Self-pay | Admitting: Cardiology

## 2017-12-12 DIAGNOSIS — J471 Bronchiectasis with (acute) exacerbation: Secondary | ICD-10-CM | POA: Diagnosis not present

## 2017-12-12 DIAGNOSIS — J449 Chronic obstructive pulmonary disease, unspecified: Secondary | ICD-10-CM | POA: Diagnosis not present

## 2017-12-12 DIAGNOSIS — J411 Mucopurulent chronic bronchitis: Secondary | ICD-10-CM | POA: Diagnosis not present

## 2017-12-23 DIAGNOSIS — J449 Chronic obstructive pulmonary disease, unspecified: Secondary | ICD-10-CM | POA: Diagnosis not present

## 2017-12-28 ENCOUNTER — Encounter: Payer: Self-pay | Admitting: Cardiology

## 2017-12-28 DIAGNOSIS — R079 Chest pain, unspecified: Secondary | ICD-10-CM | POA: Diagnosis not present

## 2017-12-28 DIAGNOSIS — M79605 Pain in left leg: Secondary | ICD-10-CM | POA: Diagnosis not present

## 2017-12-28 DIAGNOSIS — M79604 Pain in right leg: Secondary | ICD-10-CM | POA: Diagnosis not present

## 2017-12-28 DIAGNOSIS — E11649 Type 2 diabetes mellitus with hypoglycemia without coma: Secondary | ICD-10-CM | POA: Diagnosis not present

## 2017-12-28 DIAGNOSIS — E7800 Pure hypercholesterolemia, unspecified: Secondary | ICD-10-CM | POA: Diagnosis not present

## 2017-12-28 DIAGNOSIS — M47897 Other spondylosis, lumbosacral region: Secondary | ICD-10-CM | POA: Diagnosis not present

## 2017-12-28 DIAGNOSIS — K219 Gastro-esophageal reflux disease without esophagitis: Secondary | ICD-10-CM | POA: Diagnosis not present

## 2017-12-28 DIAGNOSIS — R0789 Other chest pain: Secondary | ICD-10-CM | POA: Diagnosis not present

## 2017-12-28 DIAGNOSIS — E162 Hypoglycemia, unspecified: Secondary | ICD-10-CM | POA: Diagnosis not present

## 2017-12-28 DIAGNOSIS — M479 Spondylosis, unspecified: Secondary | ICD-10-CM | POA: Diagnosis not present

## 2017-12-28 DIAGNOSIS — M545 Low back pain: Secondary | ICD-10-CM | POA: Diagnosis not present

## 2017-12-28 DIAGNOSIS — I10 Essential (primary) hypertension: Secondary | ICD-10-CM | POA: Diagnosis not present

## 2017-12-28 DIAGNOSIS — J449 Chronic obstructive pulmonary disease, unspecified: Secondary | ICD-10-CM | POA: Diagnosis not present

## 2017-12-28 DIAGNOSIS — J9811 Atelectasis: Secondary | ICD-10-CM | POA: Diagnosis not present

## 2017-12-29 ENCOUNTER — Encounter: Payer: Self-pay | Admitting: Cardiology

## 2017-12-29 ENCOUNTER — Telehealth: Payer: Self-pay | Admitting: Cardiology

## 2017-12-29 NOTE — Telephone Encounter (Signed)
Gregory Mitchell walked into the office stating that he continues to have chest pains.  States he went to [facility] ER on 12/28/2017.  Was told to follow up with heart doctor.

## 2017-12-29 NOTE — Telephone Encounter (Signed)
Patient walked into office stating he was discharged from [facility] with chest pain and he was still not feeling any better. Patient states he has continued to have chest pain. Advised patient it would be best for him to be evaluated in [facility] ER by our cardiologist to determine if this was heart related or not. Patient verbalized understanding and stated his wife was in the car and would drive him.

## 2017-12-30 DIAGNOSIS — M791 Myalgia, unspecified site: Secondary | ICD-10-CM | POA: Diagnosis not present

## 2017-12-30 DIAGNOSIS — R05 Cough: Secondary | ICD-10-CM | POA: Diagnosis not present

## 2017-12-30 DIAGNOSIS — R062 Wheezing: Secondary | ICD-10-CM | POA: Diagnosis not present

## 2017-12-30 DIAGNOSIS — J101 Influenza due to other identified influenza virus with other respiratory manifestations: Secondary | ICD-10-CM | POA: Diagnosis not present

## 2017-12-30 DIAGNOSIS — J209 Acute bronchitis, unspecified: Secondary | ICD-10-CM | POA: Diagnosis not present

## 2018-01-10 ENCOUNTER — Ambulatory Visit (INDEPENDENT_AMBULATORY_CARE_PROVIDER_SITE_OTHER): Payer: Medicare HMO | Admitting: Cardiology

## 2018-01-10 ENCOUNTER — Other Ambulatory Visit: Payer: Self-pay

## 2018-01-10 ENCOUNTER — Encounter: Payer: Self-pay | Admitting: Cardiology

## 2018-01-10 VITALS — BP 130/61 | HR 92 | Ht 66.0 in | Wt 191.0 lb

## 2018-01-10 DIAGNOSIS — R079 Chest pain, unspecified: Secondary | ICD-10-CM | POA: Diagnosis not present

## 2018-01-10 DIAGNOSIS — I5022 Chronic systolic (congestive) heart failure: Secondary | ICD-10-CM

## 2018-01-10 DIAGNOSIS — I10 Essential (primary) hypertension: Secondary | ICD-10-CM | POA: Diagnosis not present

## 2018-01-10 DIAGNOSIS — J441 Chronic obstructive pulmonary disease with (acute) exacerbation: Secondary | ICD-10-CM

## 2018-01-10 MED ORDER — PREDNISONE 20 MG PO TABS: 40.0000 mg | ORAL_TABLET | Freq: Every day | ORAL | 0 refills | Status: DC

## 2018-01-10 MED ORDER — SILDENAFIL CITRATE 100 MG PO TABS: ORAL_TABLET | ORAL | 0 refills | Status: DC

## 2018-01-10 MED ORDER — PANTOPRAZOLE SODIUM 40 MG PO TBEC: 40.0000 mg | DELAYED_RELEASE_TABLET | Freq: Every day | ORAL | 1 refills | Status: DC

## 2018-01-10 MED ORDER — AZITHROMYCIN 250 MG PO TABS: ORAL_TABLET | ORAL | 0 refills | Status: DC

## 2018-01-10 NOTE — Progress Notes (Signed)
Clinical Summary [name] is a 77 y.o.male seen today for follow up of the following medical problems.   1. Chest pain - long history of atypical chest pain - cath 2013 with nonobstructive disease - 12/2014 exercise MPI with inferior scar, no current ischemia.  - nuclear stress morehead 04/2016: no specific ischemic changes by EKG. Inferior scar without ischemia, LVEF 41%, enlarged LV. Labeled high risk due to decreased LVEF.     - seen in [facility] ER 12/2017 with multiple complaints including back pain and chest pain - trop neg x 1. EKG SR, RBBB, chronic ST/T changes  Chest tightness biltarel chest, 4-5/10 in severity. Would occur mainly at arrest. +SOB. Mildly worst with coughing. Lasts just a few minutes. Mainly occurs when awakes in the morning. Pain different from symptoms. +coughing +wheezing. Improved with breathing.  - recently with cough and chest pain, given prednisnoe 1 monthe ago. Symptoms somewhat improved.  - also can have some chest discomfort with eating that is difference.   2. COPD - tobacco x 20 years, quit 40 years ago.  - followed by Dr Koleen Nimrod. Seen by Dr Luan Pulling as well  - productive cough with yellow phlegm recently  3. Chronic systolic HF - LVEF previously as low as 35-40%, repeat echo showed improvement to 45-50% in 04/2013. He has had a prior cath 11/2011 that showed non-obstructive disease - 05/2016 echo LVEF 81-82%, grade I diastolic dysfunction  - no recent symptoms.    4. HTN - compliant with meds   5. HL - muscle aches on statinprevoiusly - diet conrolled   Has 6 kids, 17 grands.    Past Medical History:  Diagnosis Date  . Asthma   . Chronic renal insufficiency   . COPD (chronic obstructive pulmonary disease) ([place])   . Hypertension   . Renal insufficiency   . Type 2 diabetes mellitus (HCC)      Allergies  Allergen Reactions  . Amoxicillin     Makes eyes turn yellow and burn  . Ciprofloxacin     Pt  not sure     Current Outpatient Medications  Medication Sig Dispense Refill  . albuterol (PROAIR HFA) 108 (90 BASE) MCG/ACT inhaler Inhale 2 puffs into the lungs every 6 (six) hours as needed.    [name] amLODipine (NORVASC) 5 MG tablet TAKE 1 TABLET BY MOUTH EVERY DAY 30 tablet 6  . azithromycin (ZITHROMAX) 250 MG tablet TAKE 2 TABLETS DAY 1 THEN TAKE 1 TABLET DAILY FOR 4 DAYS 6 each 0  . furosemide (LASIX) 20 MG tablet TAKE 1 TABLET AS NEEDED FOR SWELLING 90 tablet 1  . ipratropium-albuterol (DUONEB) 0.5-2.5 (3) MG/3ML SOLN Take 3 mLs by nebulization every 6 (six) hours as needed.    [name] losartan (COZAAR) 50 MG tablet TAKE 1 TABLET BY MOUTH TWICE DAILY 60 tablet 6  . metformin (FORTAMET) 1000 MG (OSM) 24 hr tablet Take 1 tablet by mouth 2 (two) times daily.    . montelukast (SINGULAIR) 10 MG tablet Take 10 mg by mouth at bedtime.    . Omega-3 Fatty Acids (FISH OIL) 1000 MG CAPS Take 1 capsule by mouth 3 (three) times daily.     . predniSONE (DELTASONE) 20 MG tablet Take 2 tablets (40 mg total) by mouth daily with breakfast. 10 tablet 0  . sildenafil (VIAGRA) 100 MG tablet TAKE 1 TABLET BY MOUTH EVERY DAY AS NEEDED FOR ERECTILE DYSFUNCTION 10 tablet 0  . VIAGRA 100 MG tablet TAKE 1 TABLET  BY MOUTH DAILY AS NEEDED FOR ERECTILE DYSFUNCTION. 10 tablet 0  . vitamin E 400 UNIT capsule Take 400 Units by mouth daily.     No current facility-administered medications for this visit.      Past Surgical History:  Procedure Laterality Date  . APPENDECTOMY    . CHOLECYSTECTOMY    . NASAL SINUS SURGERY    . NEPHRECTOMY     RIGHT (Question CA.  No further therapy needed.)     Allergies  Allergen Reactions  . Amoxicillin     Makes eyes turn yellow and burn  . Ciprofloxacin     Pt not sure      Family History  Problem Relation Age of Onset  . Lung cancer Brother   . Stroke Mother   . Breast cancer Mother      Social History Gregory Mitchell reports that he quit smoking about 41 years ago.  His smoking use included cigarettes. He started smoking about 60 years ago. He has a 15.00 pack-year smoking history. He quit smokeless tobacco use about 11 years ago. His smokeless tobacco use included chew. Gregory Mitchell has no alcohol history on file.   Review of Systems CONSTITUTIONAL: No weight loss, fever, chills, weakness or fatigue.  HEENT: Eyes: No visual loss, blurred vision, double vision or yellow sclerae.No hearing loss, sneezing, congestion, runny nose or sore throat.  SKIN: No rash or itching.  CARDIOVASCULAR: per hpi RESPIRATORY: per hpi GASTROINTESTINAL: No anorexia, nausea, vomiting or diarrhea. No abdominal pain or blood.  GENITOURINARY: No burning on urination, no polyuria NEUROLOGICAL: No headache, dizziness, syncope, paralysis, ataxia, numbness or tingling in the extremities. No change in bowel or bladder control.  MUSCULOSKELETAL: No muscle, back pain, joint pain or stiffness.  LYMPHATICS: No enlarged nodes. No history of splenectomy.  PSYCHIATRIC: No history of depression or anxiety.  ENDOCRINOLOGIC: No reports of sweating, cold or heat intolerance. No polyuria or polydipsia.  [name]   Physical Examination Vitals:   01/10/18 1426  BP: 130/61  Pulse: 92  SpO2: 94%   Vitals:   01/10/18 1426  Weight: 191 lb (86.6 kg)  Height: 5\' 6"  (1.676 m)    Gen: resting comfortably, no acute distress HEENT: no scleral icterus, pupils equal round and reactive, no palptable cervical adenopathy,  CV: RRR, nom/r/g, no jvd Resp: bilateral wheezing, coarse breath sounds GI: abdomen is soft, non-tender, non-distended, normal bowel sounds, no hepatosplenomegaly MSK: extremities are warm, no edema.  Skin: warm, no rash Neuro:  no focal deficits Psych: appropriate affect   Diagnostic Studies  04/2013 echo Study Conclusions  - Left ventricle: The cavity size was mildly dilated. Wall thickness was increased in a pattern of mild to moderateLVH. Systolic function was mildly  reduced. The estimated ejection fraction was in the range of 45% to 50%. There is mild global hypokinesis. There was an increased relative contribution of atrial contraction to ventricular filling. Doppler parameters are consistent with abnormal left ventricular relaxation (grade 1 diastolic dysfunction). Doppler parameters are consistent with high ventricular filling pressure. - Mitral valve: Calcified annulus. Mildly thickened leaflets . Trivial to mild regurgitation. - Left atrium: The atrium was mildly dilated. - Right ventricle: The cavity size was mildly dilated. Wall thickness was normal. Systolic function was mildly reduced. - Right atrium: The atrium was mildly dilated. - Pericardium, extracardiac: A trivial pericardial effusion was identified.   11/2011 Cath Procedural Findings:  Hemodynamics:  AO157/77 LV154/22  Coronary angiography:  Coronary dominance: Right  Left mainstem: Normal  Left  anterior descending (LAD): Large vessel wrapping the apex. Mild lumina irregularities. D1 small normal. D2 small normal.  Left circumflex (LCx): AV group luminal irregularities. OM1 large and normal. PL x 2 small normal  Right coronary artery (RCA): Dominant. Long mid 25%. PDA normal. PL moderate and normal.   Left ventriculography: Left ventricle not injected  Final Conclusions: Mild coronary plaque. Non ischemic cardiomyopathy  Recommendations: Medical management.     12/2014 Exercise Cardiolite IMPRESSION: 1. Moderate size region of inferior wall myocardial scar. No ischemic territories seen. Overlying soft tissue attenuation artifact cannot entirely be ruled out.  2. Mild inferior wall hypokinesis.  3. Left ventricular ejection fraction 48%  4. Low to intermediate-risk stress test  findings*.   05/2016 echo Study Conclusions  - Left ventricle: The cavity size was normal. Wall thickness was increased in a pattern of mild LVH. Systolic function was normal. The estimated ejection fraction was in the range of 55% to 60%. Wall motion was normal; there were no regional wall motion abnormalities. Doppler parameters are consistent with abnormal left ventricular relaxation (grade 1 diastolic dysfunction). - Aortic valve: Mildly calcified annulus. Trileaflet; mildly thickened leaflets. Valve area (VTI): 4.03 cm^2. Valve area (Vmax): 4.25 cm^2. Valve area (Vmean): 4.49 cm^2. - Left atrium: The atrium was mildly to moderately dilated. - Atrial septum: No defect or patent foramen ovale was identified. - Technically adequate study.    Assessment and Plan  1. Chest pain - atypical symptoms that are chronic, often in the setting of COPD exacerbation, on several occasions his chest pain has resolved with a course of abx and steroids.  - multiple nuclear stress test dating back at least to 2013 have shown inferior scar. Most recent test 04/2016 at [facility] with similar findings. Cath 2013 with nonobstructive disease.   -current symptoms appear related to copd exacerbation, plan for course of abx and steroids as described below. No plans for repeat ischemic testing at this time.  - also with some GERD like symptoms, start protonix 40mg  daily.   2. COPD - followed by Dr Amanda Pea and Dr Koleen Nimrod 2015 PFTs very severe airflow limitation, significant imprvement with bronchodilator, hyperinflated lungs - severe wheezing on exam with productive cough, 5 day courese of azithromycin and prednisone 40mg  daily.   3. Chronic systolic heart failure -Echo shows that LVEF has now noramlized - no current symptoms, continue to monitor   4. HTN - at goal, continue current meds.    F/u 4 months    Arnoldo Lenis, M.D.

## 2018-01-10 NOTE — Patient Instructions (Signed)
Your physician wants you to follow-up in: [place] will receive a reminder letter in the mail two months in advance. If you don't receive a letter, please call our office to schedule the follow-up appointment.  Your physician has recommended you make the following change in your medication:   START PROTONIX 40 MG DAILY  TAKE AZITHROMYCIN 500 MG (2 TABLETS) TODAY THEN TAKE 250MG  (1 TABLET) DAILY FOR 4 DAYS  TAKE PREDNISONE 40 MG (2 TABLETS) DAILY FOR 5 DAYS   Thank you for choosing [facility]!!

## 2018-01-12 DIAGNOSIS — J449 Chronic obstructive pulmonary disease, unspecified: Secondary | ICD-10-CM | POA: Diagnosis not present

## 2018-01-15 ENCOUNTER — Encounter: Payer: Self-pay | Admitting: Cardiology

## 2018-01-17 DIAGNOSIS — Z6830 Body mass index (BMI) 30.0-30.9, adult: Secondary | ICD-10-CM | POA: Diagnosis not present

## 2018-01-17 DIAGNOSIS — J449 Chronic obstructive pulmonary disease, unspecified: Secondary | ICD-10-CM | POA: Diagnosis not present

## 2018-01-17 DIAGNOSIS — E119 Type 2 diabetes mellitus without complications: Secondary | ICD-10-CM | POA: Diagnosis not present

## 2018-01-17 DIAGNOSIS — N401 Enlarged prostate with lower urinary tract symptoms: Secondary | ICD-10-CM | POA: Diagnosis not present

## 2018-01-17 DIAGNOSIS — M62830 Muscle spasm of back: Secondary | ICD-10-CM | POA: Diagnosis not present

## 2018-01-17 DIAGNOSIS — J441 Chronic obstructive pulmonary disease with (acute) exacerbation: Secondary | ICD-10-CM | POA: Diagnosis not present

## 2018-01-17 DIAGNOSIS — I10 Essential (primary) hypertension: Secondary | ICD-10-CM | POA: Diagnosis not present

## 2018-02-11 DIAGNOSIS — J449 Chronic obstructive pulmonary disease, unspecified: Secondary | ICD-10-CM | POA: Diagnosis not present

## 2018-02-16 DIAGNOSIS — J449 Chronic obstructive pulmonary disease, unspecified: Secondary | ICD-10-CM | POA: Diagnosis not present

## 2018-03-11 DIAGNOSIS — J479 Bronchiectasis, uncomplicated: Secondary | ICD-10-CM | POA: Diagnosis not present

## 2018-03-11 DIAGNOSIS — R0989 Other specified symptoms and signs involving the circulatory and respiratory systems: Secondary | ICD-10-CM | POA: Diagnosis not present

## 2018-03-13 DIAGNOSIS — J449 Chronic obstructive pulmonary disease, unspecified: Secondary | ICD-10-CM | POA: Diagnosis not present

## 2018-03-14 DIAGNOSIS — J449 Chronic obstructive pulmonary disease, unspecified: Secondary | ICD-10-CM | POA: Diagnosis not present

## 2018-03-17 ENCOUNTER — Other Ambulatory Visit: Payer: Self-pay | Admitting: Cardiology

## 2018-03-24 DIAGNOSIS — J479 Bronchiectasis, uncomplicated: Secondary | ICD-10-CM | POA: Diagnosis not present

## 2018-03-24 DIAGNOSIS — J411 Mucopurulent chronic bronchitis: Secondary | ICD-10-CM | POA: Diagnosis not present

## 2018-03-28 DIAGNOSIS — R0789 Other chest pain: Secondary | ICD-10-CM | POA: Diagnosis not present

## 2018-03-28 DIAGNOSIS — R1110 Vomiting, unspecified: Secondary | ICD-10-CM | POA: Diagnosis not present

## 2018-03-28 DIAGNOSIS — R1013 Epigastric pain: Secondary | ICD-10-CM | POA: Diagnosis not present

## 2018-03-29 DIAGNOSIS — Z8249 Family history of ischemic heart disease and other diseases of the circulatory system: Secondary | ICD-10-CM | POA: Diagnosis not present

## 2018-03-29 DIAGNOSIS — R0789 Other chest pain: Secondary | ICD-10-CM | POA: Diagnosis not present

## 2018-03-29 DIAGNOSIS — R1013 Epigastric pain: Secondary | ICD-10-CM | POA: Diagnosis not present

## 2018-03-29 DIAGNOSIS — I10 Essential (primary) hypertension: Secondary | ICD-10-CM | POA: Diagnosis not present

## 2018-03-29 DIAGNOSIS — Z87891 Personal history of nicotine dependence: Secondary | ICD-10-CM | POA: Diagnosis not present

## 2018-03-29 DIAGNOSIS — Z7984 Long term (current) use of oral hypoglycemic drugs: Secondary | ICD-10-CM | POA: Diagnosis not present

## 2018-03-29 DIAGNOSIS — Z85528 Personal history of other malignant neoplasm of kidney: Secondary | ICD-10-CM | POA: Diagnosis not present

## 2018-03-29 DIAGNOSIS — R1110 Vomiting, unspecified: Secondary | ICD-10-CM | POA: Diagnosis not present

## 2018-03-29 DIAGNOSIS — Z833 Family history of diabetes mellitus: Secondary | ICD-10-CM | POA: Diagnosis not present

## 2018-03-29 DIAGNOSIS — Z79899 Other long term (current) drug therapy: Secondary | ICD-10-CM | POA: Diagnosis not present

## 2018-03-29 DIAGNOSIS — R079 Chest pain, unspecified: Secondary | ICD-10-CM | POA: Diagnosis not present

## 2018-03-29 DIAGNOSIS — J449 Chronic obstructive pulmonary disease, unspecified: Secondary | ICD-10-CM | POA: Diagnosis not present

## 2018-04-07 DIAGNOSIS — J449 Chronic obstructive pulmonary disease, unspecified: Secondary | ICD-10-CM | POA: Diagnosis not present

## 2018-04-12 DIAGNOSIS — J479 Bronchiectasis, uncomplicated: Secondary | ICD-10-CM | POA: Diagnosis not present

## 2018-04-14 DIAGNOSIS — J449 Chronic obstructive pulmonary disease, unspecified: Secondary | ICD-10-CM | POA: Diagnosis not present

## 2018-04-18 DIAGNOSIS — N182 Chronic kidney disease, stage 2 (mild): Secondary | ICD-10-CM | POA: Diagnosis not present

## 2018-04-18 DIAGNOSIS — Z6830 Body mass index (BMI) 30.0-30.9, adult: Secondary | ICD-10-CM | POA: Diagnosis not present

## 2018-04-18 DIAGNOSIS — N401 Enlarged prostate with lower urinary tract symptoms: Secondary | ICD-10-CM | POA: Diagnosis not present

## 2018-04-18 DIAGNOSIS — J441 Chronic obstructive pulmonary disease with (acute) exacerbation: Secondary | ICD-10-CM | POA: Diagnosis not present

## 2018-04-18 DIAGNOSIS — E119 Type 2 diabetes mellitus without complications: Secondary | ICD-10-CM | POA: Diagnosis not present

## 2018-04-18 DIAGNOSIS — Z6829 Body mass index (BMI) 29.0-29.9, adult: Secondary | ICD-10-CM | POA: Diagnosis not present

## 2018-04-18 DIAGNOSIS — M62830 Muscle spasm of back: Secondary | ICD-10-CM | POA: Diagnosis not present

## 2018-04-18 DIAGNOSIS — I10 Essential (primary) hypertension: Secondary | ICD-10-CM | POA: Diagnosis not present

## 2018-04-18 DIAGNOSIS — J449 Chronic obstructive pulmonary disease, unspecified: Secondary | ICD-10-CM | POA: Diagnosis not present

## 2018-04-21 DIAGNOSIS — R0602 Shortness of breath: Secondary | ICD-10-CM | POA: Diagnosis not present

## 2018-04-26 DIAGNOSIS — J479 Bronchiectasis, uncomplicated: Secondary | ICD-10-CM | POA: Diagnosis not present

## 2018-05-02 DIAGNOSIS — J449 Chronic obstructive pulmonary disease, unspecified: Secondary | ICD-10-CM | POA: Diagnosis not present

## 2018-05-04 DIAGNOSIS — J449 Chronic obstructive pulmonary disease, unspecified: Secondary | ICD-10-CM | POA: Diagnosis not present

## 2018-05-15 ENCOUNTER — Other Ambulatory Visit: Payer: Self-pay | Admitting: Cardiology

## 2018-06-29 ENCOUNTER — Other Ambulatory Visit: Payer: Self-pay | Admitting: Cardiology

## 2018-08-14 DIAGNOSIS — J449 Chronic obstructive pulmonary disease, unspecified: Secondary | ICD-10-CM | POA: Diagnosis not present

## 2018-08-21 DIAGNOSIS — J449 Chronic obstructive pulmonary disease, unspecified: Secondary | ICD-10-CM | POA: Diagnosis not present

## 2018-08-22 ENCOUNTER — Encounter: Payer: Self-pay | Admitting: *Deleted

## 2018-08-22 ENCOUNTER — Ambulatory Visit: Payer: Medicare HMO | Admitting: Cardiology

## 2018-08-22 ENCOUNTER — Encounter: Payer: Self-pay | Admitting: Cardiology

## 2018-08-22 VITALS — BP 124/62 | HR 83 | Ht 66.0 in | Wt 187.0 lb

## 2018-08-22 DIAGNOSIS — I5022 Chronic systolic (congestive) heart failure: Secondary | ICD-10-CM | POA: Diagnosis not present

## 2018-08-22 DIAGNOSIS — I10 Essential (primary) hypertension: Secondary | ICD-10-CM

## 2018-08-22 DIAGNOSIS — R079 Chest pain, unspecified: Secondary | ICD-10-CM | POA: Diagnosis not present

## 2018-08-22 MED ORDER — SILDENAFIL CITRATE 100 MG PO TABS: ORAL_TABLET | ORAL | 0 refills | Status: DC

## 2018-08-22 NOTE — Patient Instructions (Signed)

## 2018-08-22 NOTE — Progress Notes (Signed)
Clinical Summary [name] is a 78 y.o.male seen today for follow up of the following medical problems.   1. Chest pain - long history of atypical chest pain - cath 2013 with nonobstructive disease - 12/2014 exercise MPI with inferior scar, no current ischemia.  - nuclear stress morehead 04/2016: no specific ischemic changes by EKG. Inferior scar without ischemia, LVEF 41%, enlarged LV. Labeled high risk due to decreased LVEF.     - seen in [facility] ER 12/2017 with multiple complaints including back pain and chest pain - trop neg x 1. EKG SR, RBBB, chronic ST/T changes    - some recent chest pain that came on with cold, often with waking up in the morning with heavy coughing  - pain is worst with cough.  2. COPD - tobacco x 20 years, quit 40 years ago.  - followed by Dr Koleen Nimrod. Seen by Dr Luan Pulling as well   3. Chronic systolic HF - LVEF previously as low as 35-40%, repeat echo showed improvement to 45-50% in 04/2013. He has had a prior cath 11/2011 that showed non-obstructive disease - 05/2016 echo LVEF 00-86%, grade I diastolic dysfunction  - mild edema at times. Recent SOB he attributes to recent cold and COPD  4. HTN -compliant with meds   5. HL - muscle aches on statinprevoiusly - diet conrolled   Has 6 kids, 17 grands.   Past Medical History:  Diagnosis Date  . Asthma   . Chronic renal insufficiency   . COPD (chronic obstructive pulmonary disease) ([place])   . Hypertension   . Renal insufficiency   . Type 2 diabetes mellitus (HCC)      Allergies  Allergen Reactions  . Amoxicillin     Makes eyes turn yellow and burn  . Ciprofloxacin     Pt not sure     Current Outpatient Medications  Medication Sig Dispense Refill  . albuterol (PROAIR HFA) 108 (90 BASE) MCG/ACT inhaler Inhale 2 puffs into the lungs every 6 (six) hours as needed.    [name] amLODipine (NORVASC) 5 MG tablet TAKE 1 TABLET BY MOUTH EVERY DAY 30 tablet 6  .  azithromycin (ZITHROMAX) 250 MG tablet TAKE 2 TABLETS(500MG ) TODAY THEN TAKE 1 TABLET(250 MG) DAILY FOR 4 DAYS 6 each 0  . ipratropium-albuterol (DUONEB) 0.5-2.5 (3) MG/3ML SOLN Take 3 mLs by nebulization every 6 (six) hours as needed.    [name] losartan (COZAAR) 50 MG tablet TAKE 1 TABLET BY MOUTH TWICE DAILY 60 tablet 6  . metformin (FORTAMET) 1000 MG (OSM) 24 hr tablet Take 1 tablet by mouth 2 (two) times daily.    . montelukast (SINGULAIR) 10 MG tablet Take 10 mg by mouth at bedtime.    . Omega-3 Fatty Acids (FISH OIL) 1000 MG CAPS Take 1 capsule by mouth 3 (three) times daily.     . pantoprazole (PROTONIX) 40 MG tablet Take 1 tablet (40 mg total) by mouth daily. 90 tablet 1  . predniSONE (DELTASONE) 20 MG tablet Take 2 tablets (40 mg total) by mouth daily with breakfast. 10 tablet 0  . sildenafil (VIAGRA) 100 MG tablet TAKE 1 TABLET BY MOUTH DAILY AS NEEDED - PT NEEDS OFFICE VISIT 10 tablet 0  . tamsulosin (FLOMAX) 0.4 MG CAPS capsule TAKE ONE CAPSULE BY MOUTH ONCE DAILY - 30 MINUTES FOLLOWING THE SAME MEAL EACH DAY  0   No current facility-administered medications for this visit.      Past Surgical History:  Procedure Laterality  Date  . APPENDECTOMY    . CHOLECYSTECTOMY    . NASAL SINUS SURGERY    . NEPHRECTOMY     RIGHT (Question CA.  No further therapy needed.)     Allergies  Allergen Reactions  . Amoxicillin     Makes eyes turn yellow and burn  . Ciprofloxacin     Pt not sure      Family History  Problem Relation Age of Onset  . Lung cancer Brother   . Stroke Mother   . Breast cancer Mother      Social History [name] reports that he quit smoking about 42 years ago. His smoking use included cigarettes. He started smoking about 61 years ago. He has a 15.00 pack-year smoking history. He quit smokeless tobacco use about 12 years ago.  His smokeless tobacco use included chew. [name] has no history on file for alcohol.   Review of Systems CONSTITUTIONAL: No  weight loss, fever, chills, weakness or fatigue.  HEENT: Eyes: No visual loss, blurred vision, double vision or yellow sclerae.No hearing loss, sneezing, congestion, runny nose or sore throat.  SKIN: No rash or itching.  CARDIOVASCULAR: per hpi RESPIRATORY: per hpi GASTROINTESTINAL: No anorexia, nausea, vomiting or diarrhea. No abdominal pain or blood.  GENITOURINARY: No burning on urination, no polyuria NEUROLOGICAL: No headache, dizziness, syncope, paralysis, ataxia, numbness or tingling in the extremities. No change in bowel or bladder control.  MUSCULOSKELETAL: No muscle, back pain, joint pain or stiffness.  LYMPHATICS: No enlarged nodes. No history of splenectomy.  PSYCHIATRIC: No history of depression or anxiety.  ENDOCRINOLOGIC: No reports of sweating, cold or heat intolerance. No polyuria or polydipsia.  [name]   Physical Examination Vitals:   08/22/18 1141  BP: 124/62  Pulse: 83  SpO2: 94%   Vitals:   08/22/18 1141  Weight: 187 lb (84.8 kg)  Height: 5\' 6"  (1.676 m)    Gen: resting comfortably, no acute distress HEENT: no scleral icterus, pupils equal round and reactive, no palptable cervical adenopathy,  CV: RRR, no m/r/g, no jvd Resp: Clear to auscultation bilaterally GI: abdomen is soft, non-tender, non-distended, normal bowel sounds, no hepatosplenomegaly MSK: extremities are warm, no edema.  Skin: warm, no rash Neuro:  no focal deficits Psych: appropriate affect   Diagnostic Studies 04/2013 echo Study Conclusions  - Left ventricle: The cavity size was mildly dilated. Wall thickness was increased in a pattern of mild to moderateLVH. Systolic function was mildly reduced. The estimated ejection fraction was in the range of 45% to 50%. There is mild global hypokinesis. There was an increased relative contribution of atrial contraction to ventricular filling. Doppler parameters are consistent with abnormal left ventricular relaxation (grade  1 diastolic dysfunction). Doppler parameters are consistent with high ventricular filling pressure. - Mitral valve: Calcified annulus. Mildly thickened leaflets . Trivial to mild regurgitation. - Left atrium: The atrium was mildly dilated. - Right ventricle: The cavity size was mildly dilated. Wall thickness was normal. Systolic function was mildly reduced. - Right atrium: The atrium was mildly dilated. - Pericardium, extracardiac: A trivial pericardial effusion was identified.   11/2011 Cath Procedural Findings:  Hemodynamics:  AO157/77 LV154/22  Coronary angiography:  Coronary dominance: Right  Left mainstem: Normal  Left anterior descending (LAD): Large vessel wrapping the apex. Mild lumina irregularities. D1 small normal. D2 small normal.  Left circumflex (LCx): AV group luminal irregularities. OM1 large and normal. PL x 2 small normal  Right coronary artery (RCA): Dominant. Long mid 25%. PDA normal.  PL moderate and normal.   Left ventriculography: Left ventricle not injected  Final Conclusions: Mild coronary plaque. Non ischemic cardiomyopathy  Recommendations: Medical management.     12/2014 Exercise Cardiolite IMPRESSION: 1. Moderate size region of inferior wall myocardial scar. No ischemic territories seen. Overlying soft tissue attenuation artifact cannot entirely be ruled out.  2. Mild inferior wall hypokinesis.  3. Left ventricular ejection fraction 48%  4. Low to intermediate-risk stress test findings*.   05/2016 echo Study Conclusions  - Left ventricle: The cavity size was normal. Wall thickness was increased in a pattern of mild LVH. Systolic function was normal. The estimated ejection fraction was in the range of 55% to 60%. Wall motion was normal; there were no regional wall  motion abnormalities. Doppler parameters are consistent with abnormal left ventricular relaxation (grade 1 diastolic dysfunction). - Aortic valve: Mildly calcified annulus. Trileaflet; mildly thickened leaflets. Valve area (VTI): 4.03 cm^2. Valve area (Vmax): 4.25 cm^2. Valve area (Vmean): 4.49 cm^2. - Left atrium: The atrium was mildly to moderately dilated. - Atrial septum: No defect or patent foramen ovale was identified. - Technically adequate study.     Assessment and Plan  1. Chest pain - atypical symptoms that are chronic, often in the setting of COPD exacerbation, on several occasions his chest pain has resolved with a course of abx and steroids.  - multiple nuclear stress test dating back at least to 2013 have shown inferior scar. Most recent test 04/2016 at [facility] with similar findings. Cath 2013 with nonobstructive disease.  -no change in his chronic symptoms, continue to monitor.   2. COPD - per pcp and pulmonary. Asked to contact his pulmonologist about recent cough and SOB  3. Chronic systolic heart failure -Echo shows that LVEFhas now noramlized -no significant edema, current symptoms related to COPD and not CHF - continue to monitor  4. HTN - he is at goal, continue current meds   F/u 1 year      Arnoldo Lenis, M.D.

## 2018-08-27 DIAGNOSIS — J449 Chronic obstructive pulmonary disease, unspecified: Secondary | ICD-10-CM | POA: Diagnosis not present

## 2018-09-13 DIAGNOSIS — J209 Acute bronchitis, unspecified: Secondary | ICD-10-CM | POA: Diagnosis not present

## 2018-09-13 DIAGNOSIS — J101 Influenza due to other identified influenza virus with other respiratory manifestations: Secondary | ICD-10-CM | POA: Diagnosis not present

## 2018-09-13 DIAGNOSIS — D649 Anemia, unspecified: Secondary | ICD-10-CM | POA: Diagnosis not present

## 2018-09-13 DIAGNOSIS — R109 Unspecified abdominal pain: Secondary | ICD-10-CM | POA: Diagnosis not present

## 2018-09-13 DIAGNOSIS — R509 Fever, unspecified: Secondary | ICD-10-CM | POA: Diagnosis not present

## 2018-09-13 DIAGNOSIS — M791 Myalgia, unspecified site: Secondary | ICD-10-CM | POA: Diagnosis not present

## 2018-09-14 DIAGNOSIS — J449 Chronic obstructive pulmonary disease, unspecified: Secondary | ICD-10-CM | POA: Diagnosis not present

## 2018-09-15 ENCOUNTER — Other Ambulatory Visit: Payer: Self-pay | Admitting: Cardiology

## 2018-09-21 ENCOUNTER — Other Ambulatory Visit: Payer: Self-pay | Admitting: Cardiology

## 2018-09-22 ENCOUNTER — Telehealth: Payer: Self-pay | Admitting: Cardiology

## 2018-09-22 NOTE — Telephone Encounter (Signed)
Patient called wanting to know why he was only given 5 pills of  (VIAGRA) 100 MG tablet

## 2018-09-25 MED ORDER — SILDENAFIL CITRATE 100 MG PO TABS: ORAL_TABLET | ORAL | 0 refills | Status: DC

## 2018-09-25 NOTE — Telephone Encounter (Signed)
Informed patient that # 10 tabs had been sent to [facility] on 09/15/18.  Stated he needed this sent to [facility], [place] as this pharmacy is closer to where he lives.  New prescription sent to pharm as requested.

## 2018-09-28 DIAGNOSIS — J449 Chronic obstructive pulmonary disease, unspecified: Secondary | ICD-10-CM | POA: Diagnosis not present

## 2018-10-03 DIAGNOSIS — J449 Chronic obstructive pulmonary disease, unspecified: Secondary | ICD-10-CM | POA: Diagnosis not present

## 2018-10-11 DIAGNOSIS — R6889 Other general symptoms and signs: Secondary | ICD-10-CM | POA: Diagnosis not present

## 2018-10-11 DIAGNOSIS — J441 Chronic obstructive pulmonary disease with (acute) exacerbation: Secondary | ICD-10-CM | POA: Diagnosis not present

## 2018-10-12 DIAGNOSIS — I10 Essential (primary) hypertension: Secondary | ICD-10-CM | POA: Diagnosis not present

## 2018-10-12 DIAGNOSIS — N182 Chronic kidney disease, stage 2 (mild): Secondary | ICD-10-CM | POA: Diagnosis not present

## 2018-10-12 DIAGNOSIS — E1121 Type 2 diabetes mellitus with diabetic nephropathy: Secondary | ICD-10-CM | POA: Diagnosis not present

## 2018-10-12 DIAGNOSIS — Z1389 Encounter for screening for other disorder: Secondary | ICD-10-CM | POA: Diagnosis not present

## 2018-10-12 DIAGNOSIS — Z0000 Encounter for general adult medical examination without abnormal findings: Secondary | ICD-10-CM | POA: Diagnosis not present

## 2018-10-12 DIAGNOSIS — J440 Chronic obstructive pulmonary disease with acute lower respiratory infection: Secondary | ICD-10-CM | POA: Diagnosis not present

## 2018-10-12 DIAGNOSIS — N401 Enlarged prostate with lower urinary tract symptoms: Secondary | ICD-10-CM | POA: Diagnosis not present

## 2018-10-13 DIAGNOSIS — J449 Chronic obstructive pulmonary disease, unspecified: Secondary | ICD-10-CM | POA: Diagnosis not present

## 2018-10-20 DIAGNOSIS — J440 Chronic obstructive pulmonary disease with acute lower respiratory infection: Secondary | ICD-10-CM | POA: Diagnosis not present

## 2018-10-20 DIAGNOSIS — N182 Chronic kidney disease, stage 2 (mild): Secondary | ICD-10-CM | POA: Diagnosis not present

## 2018-10-20 DIAGNOSIS — Z79899 Other long term (current) drug therapy: Secondary | ICD-10-CM | POA: Diagnosis not present

## 2018-10-20 DIAGNOSIS — Z1389 Encounter for screening for other disorder: Secondary | ICD-10-CM | POA: Diagnosis not present

## 2018-10-20 DIAGNOSIS — Z0000 Encounter for general adult medical examination without abnormal findings: Secondary | ICD-10-CM | POA: Diagnosis not present

## 2018-10-20 DIAGNOSIS — N401 Enlarged prostate with lower urinary tract symptoms: Secondary | ICD-10-CM | POA: Diagnosis not present

## 2018-10-20 DIAGNOSIS — E1121 Type 2 diabetes mellitus with diabetic nephropathy: Secondary | ICD-10-CM | POA: Diagnosis not present

## 2018-10-20 DIAGNOSIS — E559 Vitamin D deficiency, unspecified: Secondary | ICD-10-CM | POA: Diagnosis not present

## 2018-10-20 DIAGNOSIS — I10 Essential (primary) hypertension: Secondary | ICD-10-CM | POA: Diagnosis not present

## 2018-10-20 DIAGNOSIS — Z125 Encounter for screening for malignant neoplasm of prostate: Secondary | ICD-10-CM | POA: Diagnosis not present

## 2018-10-26 DIAGNOSIS — J40 Bronchitis, not specified as acute or chronic: Secondary | ICD-10-CM | POA: Diagnosis not present

## 2018-10-26 DIAGNOSIS — R6889 Other general symptoms and signs: Secondary | ICD-10-CM | POA: Diagnosis not present

## 2018-11-02 DIAGNOSIS — I10 Essential (primary) hypertension: Secondary | ICD-10-CM | POA: Diagnosis not present

## 2018-11-02 DIAGNOSIS — Z85528 Personal history of other malignant neoplasm of kidney: Secondary | ICD-10-CM | POA: Diagnosis not present

## 2018-11-02 DIAGNOSIS — R0789 Other chest pain: Secondary | ICD-10-CM | POA: Diagnosis not present

## 2018-11-02 DIAGNOSIS — E7800 Pure hypercholesterolemia, unspecified: Secondary | ICD-10-CM | POA: Diagnosis not present

## 2018-11-02 DIAGNOSIS — Z9981 Dependence on supplemental oxygen: Secondary | ICD-10-CM | POA: Diagnosis not present

## 2018-11-02 DIAGNOSIS — J441 Chronic obstructive pulmonary disease with (acute) exacerbation: Secondary | ICD-10-CM | POA: Diagnosis not present

## 2018-11-02 DIAGNOSIS — Z79899 Other long term (current) drug therapy: Secondary | ICD-10-CM | POA: Diagnosis not present

## 2018-11-02 DIAGNOSIS — R918 Other nonspecific abnormal finding of lung field: Secondary | ICD-10-CM | POA: Diagnosis not present

## 2018-11-02 DIAGNOSIS — E119 Type 2 diabetes mellitus without complications: Secondary | ICD-10-CM | POA: Diagnosis not present

## 2018-11-02 DIAGNOSIS — Z87891 Personal history of nicotine dependence: Secondary | ICD-10-CM | POA: Diagnosis not present

## 2018-11-02 DIAGNOSIS — K219 Gastro-esophageal reflux disease without esophagitis: Secondary | ICD-10-CM | POA: Diagnosis not present

## 2018-11-02 DIAGNOSIS — Z7984 Long term (current) use of oral hypoglycemic drugs: Secondary | ICD-10-CM | POA: Diagnosis not present

## 2018-11-03 ENCOUNTER — Other Ambulatory Visit: Payer: Self-pay | Admitting: Cardiology

## 2018-11-13 DIAGNOSIS — J449 Chronic obstructive pulmonary disease, unspecified: Secondary | ICD-10-CM | POA: Diagnosis not present

## 2018-12-12 DIAGNOSIS — J471 Bronchiectasis with (acute) exacerbation: Secondary | ICD-10-CM | POA: Diagnosis not present

## 2018-12-12 DIAGNOSIS — J069 Acute upper respiratory infection, unspecified: Secondary | ICD-10-CM | POA: Diagnosis not present

## 2018-12-13 DIAGNOSIS — J449 Chronic obstructive pulmonary disease, unspecified: Secondary | ICD-10-CM | POA: Diagnosis not present

## 2018-12-26 DIAGNOSIS — K5900 Constipation, unspecified: Secondary | ICD-10-CM | POA: Diagnosis not present

## 2018-12-26 DIAGNOSIS — R399 Unspecified symptoms and signs involving the genitourinary system: Secondary | ICD-10-CM | POA: Diagnosis not present

## 2018-12-26 DIAGNOSIS — M549 Dorsalgia, unspecified: Secondary | ICD-10-CM | POA: Diagnosis not present

## 2018-12-27 ENCOUNTER — Other Ambulatory Visit: Payer: Self-pay | Admitting: Cardiology

## 2019-01-13 DIAGNOSIS — J449 Chronic obstructive pulmonary disease, unspecified: Secondary | ICD-10-CM | POA: Diagnosis not present

## 2019-01-15 IMAGING — DX DG CHEST 2V
2 series · 2 of 2 positions shown · non-contrast
Comparison: 07/02/2016

CLINICAL DATA: Productive cough, generalized chest pain, shortness
breath and weakness for 2 weeks, history asthma, hypertension,
diabetes mellitus, renal insufficiency

EXAM:
CHEST  2 VIEW

[chest pa]
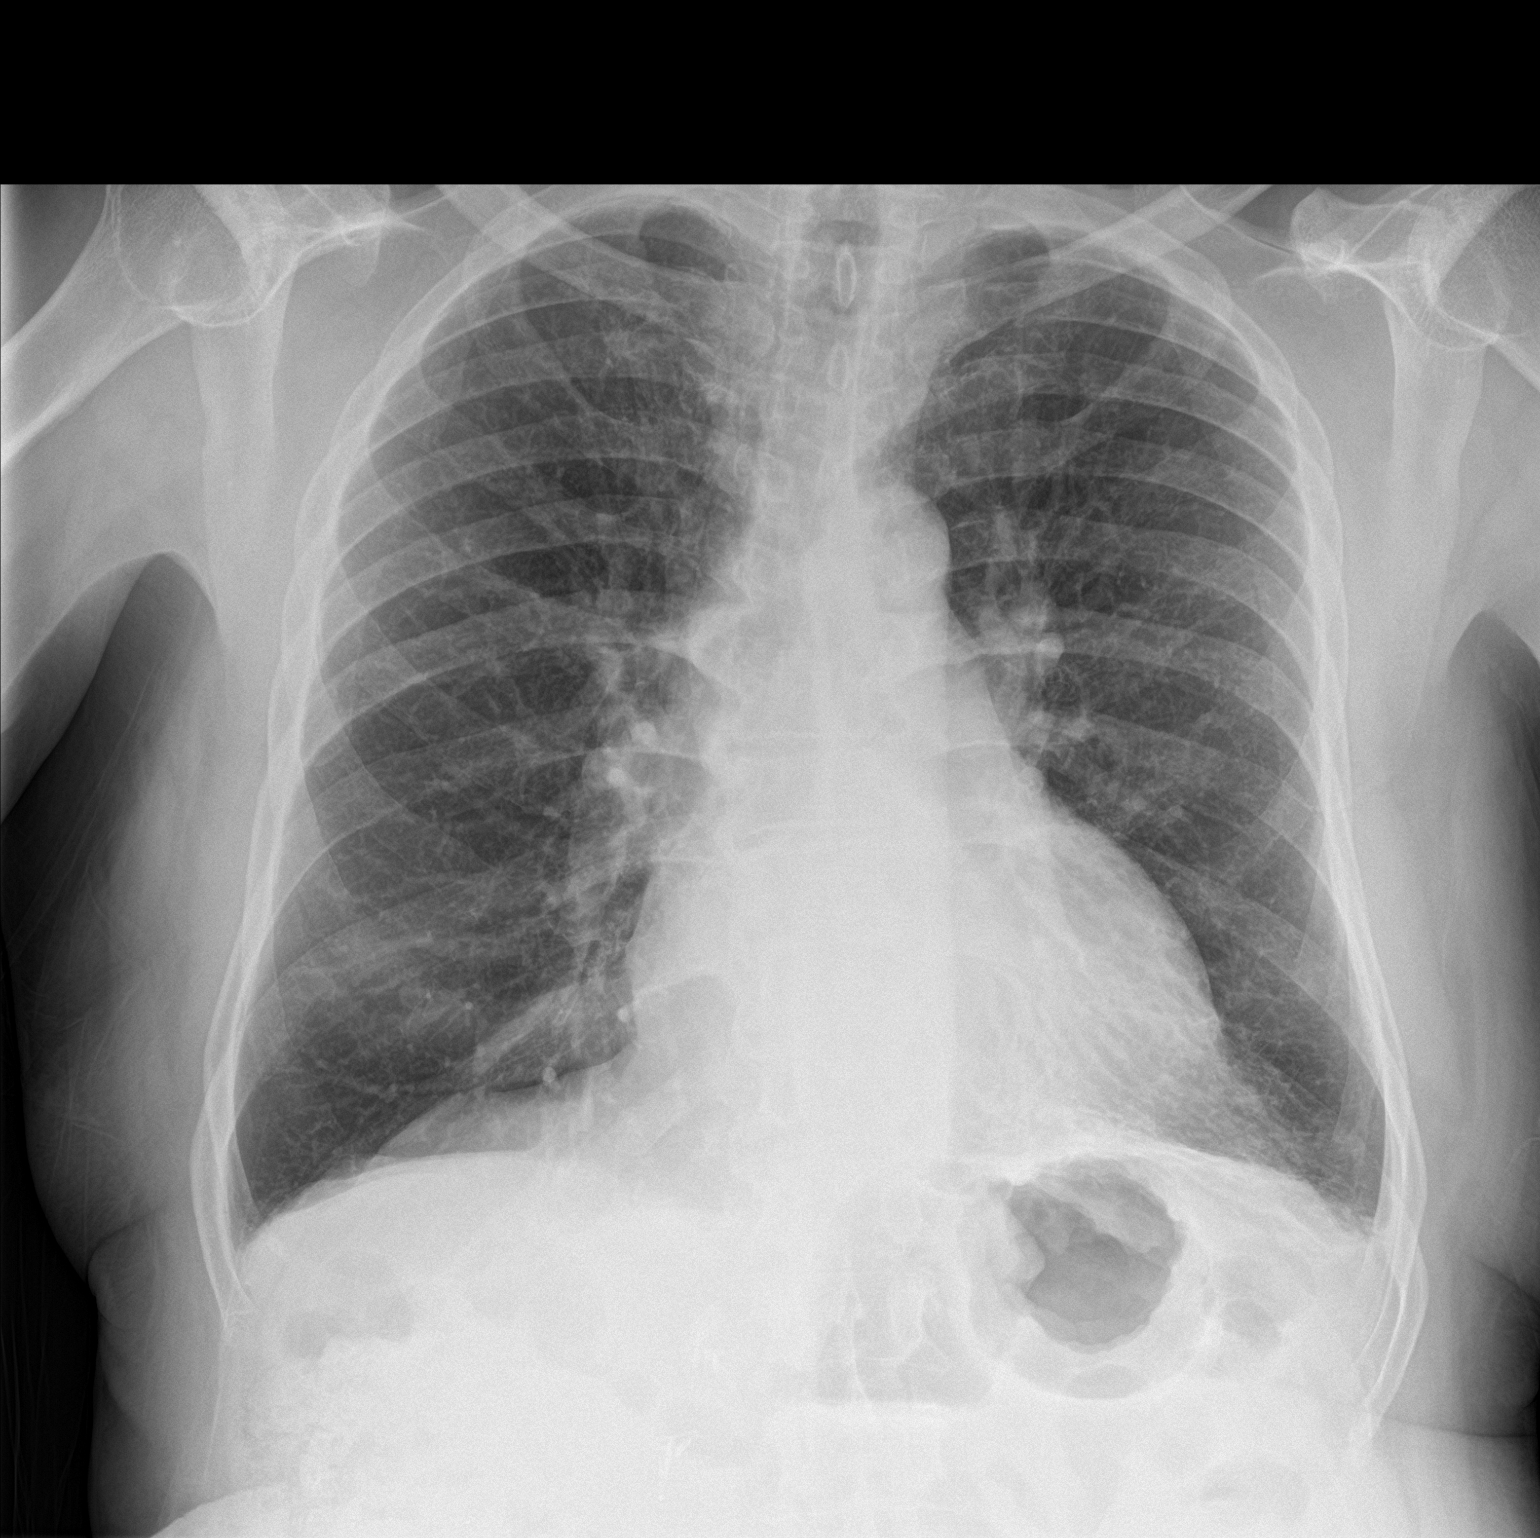

[chest lat]
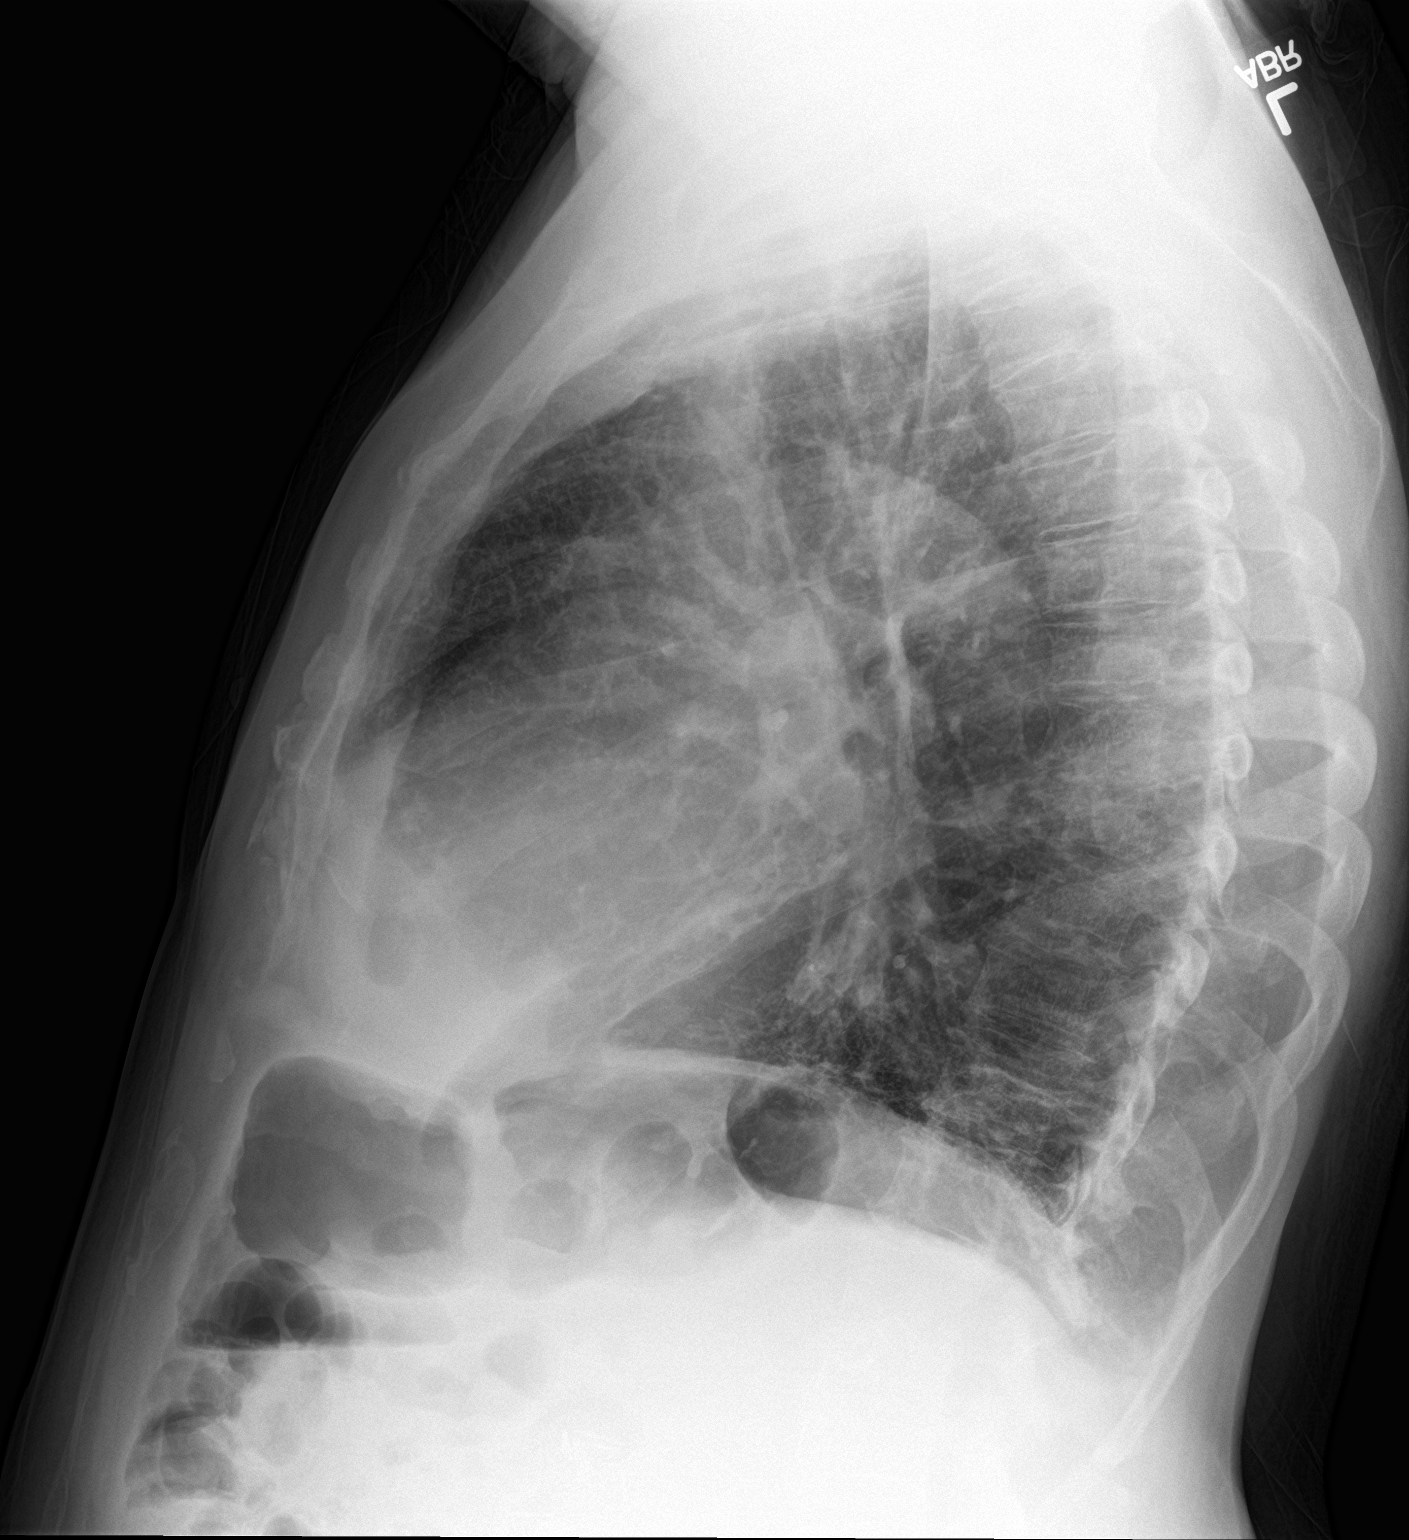

[2 of 2 positions shown; findings below may reference images not displayed]

FINDINGS: Upper normal heart size.

Mediastinal contours and pulmonary vascularity normal.

Chronic atelectasis versus scarring at LEFT base.

Remaining lungs mildly hyperinflated but clear.

No pleural effusion or pneumothorax.

Degenerative disc disease changes thoracic spine.

No acute osseous findings.
IMPRESSION: COPD changes with chronic atelectasis versus scarring at LEFT base.

No definite acute abnormalities.

## 2019-01-16 DIAGNOSIS — J440 Chronic obstructive pulmonary disease with acute lower respiratory infection: Secondary | ICD-10-CM | POA: Diagnosis not present

## 2019-01-16 DIAGNOSIS — N401 Enlarged prostate with lower urinary tract symptoms: Secondary | ICD-10-CM | POA: Diagnosis not present

## 2019-01-16 DIAGNOSIS — I10 Essential (primary) hypertension: Secondary | ICD-10-CM | POA: Diagnosis not present

## 2019-01-16 DIAGNOSIS — E1121 Type 2 diabetes mellitus with diabetic nephropathy: Secondary | ICD-10-CM | POA: Diagnosis not present

## 2019-01-16 DIAGNOSIS — Z6827 Body mass index (BMI) 27.0-27.9, adult: Secondary | ICD-10-CM | POA: Diagnosis not present

## 2019-01-16 DIAGNOSIS — N182 Chronic kidney disease, stage 2 (mild): Secondary | ICD-10-CM | POA: Diagnosis not present

## 2019-03-10 DIAGNOSIS — I2699 Other pulmonary embolism without acute cor pulmonale: Secondary | ICD-10-CM

## 2019-03-10 HISTORY — DX: Other pulmonary embolism without acute cor pulmonale: I26.99

## 2019-03-17 ENCOUNTER — Other Ambulatory Visit: Payer: Self-pay | Admitting: Cardiology

## 2019-04-02 ENCOUNTER — Encounter: Payer: Self-pay | Admitting: Cardiology

## 2019-04-09 ENCOUNTER — Encounter: Payer: Self-pay | Admitting: *Deleted

## 2019-04-10 ENCOUNTER — Encounter: Payer: Self-pay | Admitting: Cardiology

## 2019-04-10 ENCOUNTER — Other Ambulatory Visit: Payer: Self-pay

## 2019-04-10 ENCOUNTER — Ambulatory Visit (INDEPENDENT_AMBULATORY_CARE_PROVIDER_SITE_OTHER): Payer: Medicare HMO | Admitting: Cardiology

## 2019-04-10 ENCOUNTER — Other Ambulatory Visit: Payer: Self-pay | Admitting: *Deleted

## 2019-04-10 VITALS — BP 142/75 | HR 96 | Temp 98.5°F | Ht 66.0 in | Wt 173.4 lb

## 2019-04-10 DIAGNOSIS — I10 Essential (primary) hypertension: Secondary | ICD-10-CM

## 2019-04-10 DIAGNOSIS — I5022 Chronic systolic (congestive) heart failure: Secondary | ICD-10-CM | POA: Diagnosis not present

## 2019-04-10 DIAGNOSIS — R079 Chest pain, unspecified: Secondary | ICD-10-CM

## 2019-04-10 MED ORDER — SILDENAFIL CITRATE 100 MG PO TABS: ORAL_TABLET | ORAL | 6 refills | Status: DC

## 2019-04-10 NOTE — Patient Instructions (Signed)
Your physician wants you to follow-up in: 4 MONTHS WITH DR BRANCH You will receive a reminder letter in the mail two months in advance. If you don't receive a letter, please call our office to schedule the follow-up appointment.  Your physician recommends that you continue on your current medications as directed. Please refer to the Current Medication list given to you today.  Thank you for choosing [facility]!!   

## 2019-04-10 NOTE — Progress Notes (Signed)
Clinical Summary Mr. Eymard is a 78 y.o.male  seen today for follow up of the following medical problems.   1. Chest pain - long history of atypical chest pain - cath 2013 with nonobstructive disease - 12/2014 exercise MPI with inferior scar, no current ischemia.  - nuclear stress morehead 04/2016: no specific ischemic changes by EKG. Inferior scar without ischemia, LVEF 41%, enlarged LV. Labeled high risk due to decreased LVEF.     - seen in [facility] ER 12/2017 with multiple complaints including back pain and chest pain - trop neg x 1. EKG SR, RBBB, chronic ST/T changes   - some recent chest pain associated with coughing, recent admission for pneumonia and COPD exacerbation.   2. COPD - tobacco x 20 years, quit 40 years ago.  - followed by Dr Koleen Nimrod. Seen by Dr Luan Pulling as well  - recent exacerbation, has been on steroids and abx recently   3. Chronic systolic HF - LVEF previously as low as 35-40%, repeat echo showed improvement to 45-50% in 04/2013. He has had a prior cath 11/2011 that showed non-obstructive disease - 05/2016 echo LVEF 0000000, grade I diastolic dysfunction  - no significant edema  4. HTN -he remains compliant with meds   5. HL - muscle aches on statinprevoiusly - currently diet controlled   6. Pneumonia - recent admission 03/2019 [facility] with bilateral pneumonia - reports side effects on abx. Changed to doxycycline by pcp and completed, also given 5 days course of prednisone - +cough, + wheezing, ongoing SOB.   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 By history appears unprovoked.   Has 6 kids, 67 grands.   Past Medical History:  Diagnosis Date  . Asthma   . Chronic renal insufficiency   . COPD (chronic obstructive pulmonary disease) ([place])   . Hypertension   . Renal insufficiency   . Type 2 diabetes mellitus (H[facility])      Allergies  Allergen Reactions   . Amoxicillin     Makes eyes turn yellow and burn  . Ciprofloxacin     Pt not sure     Current Outpatient Medications  Medication Sig Dispense Refill  . albuterol (PROAIR HFA) 108 (90 BASE) MCG/ACT inhaler Inhale 2 puffs into the lungs every 6 (six) hours as needed.    [name] apixaban (ELIQUIS) 5 MG TABS tablet Take 5 mg by mouth 2 (two) times daily.    [name] ipratropium-albuterol (DUONEB) 0.5-2.5 (3) MG/3ML SOLN Take 3 mLs by nebulization every 6 (six) hours as needed.    [name] losartan (COZAAR) 50 MG tablet TAKE 1 TABLET BY MOUTH TWICE DAILY (Patient taking differently: Take 50 mg by mouth daily. ) 60 tablet 6  . metFORMIN (GLUCOPHAGE-XR) 500 MG 24 hr tablet Take 1,000 mg by mouth 2 (two) times daily.    . montelukast (SINGULAIR) 10 MG tablet Take 10 mg by mouth at bedtime.    . Omega-3 Fatty Acids (FISH OIL) 1000 MG CAPS Take 1 capsule by mouth 2 (two) times daily.     . sildenafil (VIAGRA) 100 MG tablet TAKE 1 TABLET BY MOUTH EVERY DAY AS NEEDED 10 tablet 0  . tamsulosin (FLOMAX) 0.4 MG CAPS capsule TAKE ONE CAPSULE BY MOUTH ONCE DAILY - 30 MINUTES FOLLOWING THE SAME MEAL EACH DAY  0  . amLODipine (NORVASC) 5 MG tablet TAKE 1 TABLET BY MOUTH EVERY DAY (Patient not taking: Reported on 04/10/2019) 30  tablet 6   No current facility-administered medications for this visit.      Past Surgical History:  Procedure Laterality Date  . APPENDECTOMY    . CHOLECYSTECTOMY    . NASAL SINUS SURGERY    . NEPHRECTOMY     RIGHT (Question CA.  No further therapy needed.)     Allergies  Allergen Reactions  . Amoxicillin     Makes eyes turn yellow and burn  . Ciprofloxacin     Pt not sure      Family History  Problem Relation Age of Onset  . Lung cancer Brother   . Stroke Mother   . Breast cancer Mother      Social History Mr. Schwitzer reports that he quit smoking about 42 years ago. His smoking use included cigarettes. He started smoking about 61 years ago. He has a 15.00 pack-year smoking  history. He quit smokeless tobacco use about 13 years ago.  His smokeless tobacco use included chew. Mr. Sarra has no history on file for alcohol.   Review of Systems CONSTITUTIONAL: No weight loss, fever, chills, weakness or fatigue.  HEENT: Eyes: No visual loss, blurred vision, double vision or yellow sclerae.No hearing loss, sneezing, congestion, runny nose or sore throat.  SKIN: No rash or itching.  CARDIOVASCULAR: per hpi RESPIRATORY: per hpi GASTROINTESTINAL: No anorexia, nausea, vomiting or diarrhea. No abdominal pain or blood.  GENITOURINARY: No burning on urination, no polyuria NEUROLOGICAL: No headache, dizziness, syncope, paralysis, ataxia, numbness or tingling in the extremities. No change in bowel or bladder control.  MUSCULOSKELETAL: No muscle, back pain, joint pain or stiffness.  LYMPHATICS: No enlarged nodes. No history of splenectomy.  PSYCHIATRIC: No history of depression or anxiety.  ENDOCRINOLOGIC: No reports of sweating, cold or heat intolerance. No polyuria or polydipsia.  [name]   Physical Examination Vitals:   04/10/19 1601  BP: (!) 142/75  Pulse: 96  Temp: 98.5 F (36.9 C)  SpO2: 98%   Filed Weights   04/10/19 1601  Weight: 173 lb 6.4 oz (78.7 kg)    Gen: resting comfortably, no acute distress HEENT: no scleral icterus, pupils equal round and reactive, no palptable cervical adenopathy,  CV: RRR, no m/r/g, no jvd Resp: bilateral wheezing GI: abdomen is soft, non-tender, non-distended, normal bowel sounds, no hepatosplenomegaly MSK: extremities are warm, no edema.  Skin: warm, no rash Neuro:  no focal deficits Psych: appropriate affect   Diagnostic Studies 04/2013 echo Study Conclusions  - Left ventricle: The cavity size was mildly dilated. Wall thickness was increased in a pattern of mild to moderateLVH. Systolic function was mildly reduced. The estimated ejection fraction was in the range of 45% to 50%. There is mild global  hypokinesis. There was an increased relative contribution of atrial contraction to ventricular filling. Doppler parameters are consistent with abnormal left ventricular relaxation (grade 1 diastolic dysfunction). Doppler parameters are consistent with high ventricular filling pressure. - Mitral valve: Calcified annulus. Mildly thickened leaflets . Trivial to mild regurgitation. - Left atrium: The atrium was mildly dilated. - Right ventricle: The cavity size was mildly dilated. Wall thickness was normal. Systolic function was mildly reduced. - Right atrium: The atrium was mildly dilated. - Pericardium, extracardiac: A trivial pericardial effusion was identified.   11/2011 Cath Procedural Findings:  Hemodynamics:  AO157/77 LV154/22  Coronary angiography:  Coronary dominance: Right  Left mainstem: Normal  Left anterior descending (LAD): Large vessel wrapping the apex. Mild lumina irregularities. D1 small normal. D2 small normal.  Left circumflex (LCx):  AV group luminal irregularities. OM1 large and normal. PL x 2 small normal  Right coronary artery (RCA): Dominant. Long mid 25%. PDA normal. PL moderate and normal.   Left ventriculography: Left ventricle not injected  Final Conclusions: Mild coronary plaque. Non ischemic cardiomyopathy  Recommendations: Medical management.     12/2014 Exercise Cardiolite IMPRESSION: 1. Moderate size region of inferior wall myocardial scar. No ischemic territories seen. Overlying soft tissue attenuation artifact cannot entirely be ruled out.  2. Mild inferior wall hypokinesis.  3. Left ventricular ejection fraction 48%  4. Low to intermediate-risk stress test findings*.   05/2016 echo Study Conclusions  - Left ventricle: The cavity size was normal. Wall thickness  was increased in a pattern of mild LVH. Systolic function was normal. The estimated ejection fraction was in the range of 55% to 60%. Wall motion was normal; there were no regional wall motion abnormalities. Doppler parameters are consistent with abnormal left ventricular relaxation (grade 1 diastolic dysfunction). - Aortic valve: Mildly calcified annulus. Trileaflet; mildly thickened leaflets. Valve area (VTI): 4.03 cm^2. Valve area (Vmax): 4.25 cm^2. Valve area (Vmean): 4.49 cm^2. - Left atrium: The atrium was mildly to moderately dilated. - Atrial septum: No defect or patent foramen ovale was identified. - Technically adequate study.    Assessment and Plan   1. Chest pain - atypical symptomsthat are chronic, often in the setting of COPD exacerbation, on several occasions his chest pain has resolved with a course of abx and steroids. - multiple nuclear stress test dating back at least to 2013 have shown inferior scar. Most recent test 04/2016 at [facility] with similar findings. Cath 2013 with nonobstructive disease.  -chronic symptoms unchanged, recent chest pain worst with coughing in setting of COPD exacerbation and recent pneumonia not consistent with ischemia.   2. COPD - per pcp and pulmonary.  3. Chronic systolic heart failure -Echo shows that LVEFhas now noramlized -no recent edema, continue current meds  4. HTN - mildly elevated in setting ongoing COPD exacerbation and recent steroid use, continue to monitor at this time.     Arnoldo Lenis, M.D.

## 2019-08-30 ENCOUNTER — Encounter: Payer: Self-pay | Admitting: Cardiology

## 2019-08-31 ENCOUNTER — Telehealth: Payer: Self-pay | Admitting: *Deleted

## 2019-08-31 ENCOUNTER — Other Ambulatory Visit: Payer: Self-pay

## 2019-08-31 ENCOUNTER — Inpatient Hospital Stay (HOSPITAL_COMMUNITY)
Admission: EM | Admit: 2019-08-31 | Discharge: 2019-09-04 | DRG: 286 | Disposition: A | Discharge status: Home / Self Care | Payer: Medicare HMO | Attending: Internal Medicine | Admitting: Internal Medicine

## 2019-08-31 ENCOUNTER — Encounter (HOSPITAL_COMMUNITY): Payer: Self-pay

## 2019-08-31 ENCOUNTER — Emergency Department (HOSPITAL_COMMUNITY): Payer: Medicare HMO

## 2019-08-31 ENCOUNTER — Inpatient Hospital Stay (HOSPITAL_COMMUNITY): Payer: Medicare HMO

## 2019-08-31 ENCOUNTER — Ambulatory Visit: Payer: Medicare HMO | Admitting: Cardiology

## 2019-08-31 DIAGNOSIS — I440 Atrioventricular block, first degree: Secondary | ICD-10-CM | POA: Diagnosis present

## 2019-08-31 DIAGNOSIS — I519 Heart disease, unspecified: Secondary | ICD-10-CM | POA: Diagnosis not present

## 2019-08-31 DIAGNOSIS — I5023 Acute on chronic systolic (congestive) heart failure: Secondary | ICD-10-CM | POA: Diagnosis not present

## 2019-08-31 DIAGNOSIS — I130 Hypertensive heart and chronic kidney disease with heart failure and stage 1 through stage 4 chronic kidney disease, or unspecified chronic kidney disease: Principal | ICD-10-CM | POA: Diagnosis present

## 2019-08-31 DIAGNOSIS — I5033 Acute on chronic diastolic (congestive) heart failure: Secondary | ICD-10-CM

## 2019-08-31 DIAGNOSIS — J449 Chronic obstructive pulmonary disease, unspecified: Secondary | ICD-10-CM | POA: Diagnosis present

## 2019-08-31 DIAGNOSIS — Z7902 Long term (current) use of antithrombotics/antiplatelets: Secondary | ICD-10-CM

## 2019-08-31 DIAGNOSIS — Z79899 Other long term (current) drug therapy: Secondary | ICD-10-CM

## 2019-08-31 DIAGNOSIS — E869 Volume depletion, unspecified: Secondary | ICD-10-CM | POA: Diagnosis not present

## 2019-08-31 DIAGNOSIS — Z87891 Personal history of nicotine dependence: Secondary | ICD-10-CM | POA: Diagnosis not present

## 2019-08-31 DIAGNOSIS — I5043 Acute on chronic combined systolic (congestive) and diastolic (congestive) heart failure: Secondary | ICD-10-CM | POA: Diagnosis not present

## 2019-08-31 DIAGNOSIS — I248 Other forms of acute ischemic heart disease: Secondary | ICD-10-CM | POA: Diagnosis not present

## 2019-08-31 DIAGNOSIS — I453 Trifascicular block: Secondary | ICD-10-CM | POA: Diagnosis not present

## 2019-08-31 DIAGNOSIS — N189 Chronic kidney disease, unspecified: Secondary | ICD-10-CM | POA: Diagnosis not present

## 2019-08-31 DIAGNOSIS — R072 Precordial pain: Secondary | ICD-10-CM

## 2019-08-31 DIAGNOSIS — N179 Acute kidney failure, unspecified: Secondary | ICD-10-CM

## 2019-08-31 DIAGNOSIS — J439 Emphysema, unspecified: Secondary | ICD-10-CM | POA: Diagnosis not present

## 2019-08-31 DIAGNOSIS — Z20822 Contact with and (suspected) exposure to covid-19: Secondary | ICD-10-CM | POA: Diagnosis present

## 2019-08-31 DIAGNOSIS — Z86711 Personal history of pulmonary embolism: Secondary | ICD-10-CM

## 2019-08-31 DIAGNOSIS — E1122 Type 2 diabetes mellitus with diabetic chronic kidney disease: Secondary | ICD-10-CM

## 2019-08-31 DIAGNOSIS — N400 Enlarged prostate without lower urinary tract symptoms: Secondary | ICD-10-CM | POA: Diagnosis present

## 2019-08-31 DIAGNOSIS — J810 Acute pulmonary edema: Secondary | ICD-10-CM

## 2019-08-31 DIAGNOSIS — N1831 Chronic kidney disease, stage 3a: Secondary | ICD-10-CM | POA: Diagnosis present

## 2019-08-31 DIAGNOSIS — Z7952 Long term (current) use of systemic steroids: Secondary | ICD-10-CM

## 2019-08-31 DIAGNOSIS — I071 Rheumatic tricuspid insufficiency: Secondary | ICD-10-CM | POA: Diagnosis present

## 2019-08-31 DIAGNOSIS — R079 Chest pain, unspecified: Secondary | ICD-10-CM

## 2019-08-31 DIAGNOSIS — Z880 Allergy status to penicillin: Secondary | ICD-10-CM

## 2019-08-31 DIAGNOSIS — Z905 Acquired absence of kidney: Secondary | ICD-10-CM | POA: Diagnosis not present

## 2019-08-31 DIAGNOSIS — I10 Essential (primary) hypertension: Secondary | ICD-10-CM | POA: Diagnosis present

## 2019-08-31 DIAGNOSIS — J9621 Acute and chronic respiratory failure with hypoxia: Secondary | ICD-10-CM | POA: Diagnosis not present

## 2019-08-31 DIAGNOSIS — Z881 Allergy status to other antibiotic agents status: Secondary | ICD-10-CM

## 2019-08-31 DIAGNOSIS — E1121 Type 2 diabetes mellitus with diabetic nephropathy: Secondary | ICD-10-CM | POA: Diagnosis not present

## 2019-08-31 DIAGNOSIS — R0602 Shortness of breath: Secondary | ICD-10-CM | POA: Diagnosis not present

## 2019-08-31 DIAGNOSIS — Z7984 Long term (current) use of oral hypoglycemic drugs: Secondary | ICD-10-CM | POA: Diagnosis not present

## 2019-08-31 DIAGNOSIS — Z9981 Dependence on supplemental oxygen: Secondary | ICD-10-CM

## 2019-08-31 DIAGNOSIS — I959 Hypotension, unspecified: Secondary | ICD-10-CM | POA: Diagnosis not present

## 2019-08-31 DIAGNOSIS — E119 Type 2 diabetes mellitus without complications: Secondary | ICD-10-CM

## 2019-08-31 DIAGNOSIS — I428 Other cardiomyopathies: Secondary | ICD-10-CM | POA: Diagnosis present

## 2019-08-31 DIAGNOSIS — I2510 Atherosclerotic heart disease of native coronary artery without angina pectoris: Secondary | ICD-10-CM | POA: Diagnosis present

## 2019-08-31 DIAGNOSIS — N289 Disorder of kidney and ureter, unspecified: Secondary | ICD-10-CM | POA: Diagnosis present

## 2019-08-31 LAB — COMPREHENSIVE METABOLIC PANEL
ALT: 14 U/L (ref 0–44)
AST: 22 U/L (ref 15–41)
Albumin: 3.4 g/dL — ABNORMAL LOW (ref 3.5–5.0)
Alkaline Phosphatase: 34 U/L — ABNORMAL LOW (ref 38–126)
Anion gap: 9 (ref 5–15)
BUN: 10 mg/dL (ref 8–23)
CO2: 27 mmol/L (ref 22–32)
Calcium: 8.7 mg/dL — ABNORMAL LOW (ref 8.9–10.3)
Chloride: 103 mmol/L (ref 98–111)
Creatinine, Ser: 1.22 mg/dL (ref 0.61–1.24)
GFR calc Af Amer: >60 mL/min (ref >60)
GFR calc non Af Amer: 56 mL/min — ABNORMAL LOW (ref >60)
Glucose, Bld: 80 mg/dL (ref 70–99)
Potassium: 4 mmol/L (ref 3.5–5.1)
Sodium: 139 mmol/L (ref 135–145)
Total Bilirubin: 0.7 mg/dL (ref 0.3–1.2)
Total Protein: 6.7 g/dL (ref 6.5–8.1)

## 2019-08-31 LAB — HEMOGLOBIN A1C
Hgb A1c MFr Bld: 6.1 % — ABNORMAL HIGH (ref 4.8–5.6)
Mean Plasma Glucose: 128.37 mg/dL

## 2019-08-31 LAB — CBC WITH DIFFERENTIAL/PLATELET
Abs Immature Granulocytes: 0.01 10*3/uL (ref 0.00–0.07)
Basophils Absolute: 0.1 10*3/uL (ref 0.0–0.1)
Basophils Relative: 1 %
Eosinophils Absolute: 0.2 10*3/uL (ref 0.0–0.5)
Eosinophils Relative: 5 %
HCT: 37.6 % — ABNORMAL LOW (ref 39.0–52.0)
Hemoglobin: 11 g/dL — ABNORMAL LOW (ref 13.0–17.0)
Immature Granulocytes: 0 %
Lymphocytes Relative: 32 %
Lymphs Abs: 1.2 10*3/uL (ref 0.7–4.0)
MCH: 26.7 pg (ref 26.0–34.0)
MCHC: 29.3 g/dL — ABNORMAL LOW (ref 30.0–36.0)
MCV: 91.3 fL (ref 80.0–100.0)
Monocytes Absolute: 0.5 10*3/uL (ref 0.1–1.0)
Monocytes Relative: 12 %
Neutro Abs: 2 10*3/uL (ref 1.7–7.7)
Neutrophils Relative %: 50 %
Platelets: 272 10*3/uL (ref 150–400)
RBC: 4.12 MIL/uL — ABNORMAL LOW (ref 4.22–5.81)
RDW: 13.7 % (ref 11.5–15.5)
WBC: 3.9 10*3/uL — ABNORMAL LOW (ref 4.0–10.5)
nRBC: 0 % (ref 0.0–0.2)

## 2019-08-31 LAB — CBG MONITORING, ED
Glucose-Capillary: 69 mg/dL — ABNORMAL LOW (ref 70–99)
Glucose-Capillary: 160 mg/dL — ABNORMAL HIGH (ref 70–99)

## 2019-08-31 LAB — TROPONIN I (HIGH SENSITIVITY)
Troponin I (High Sensitivity): 39 ng/L — ABNORMAL HIGH (ref <18)
Troponin I (High Sensitivity): 40 ng/L — ABNORMAL HIGH (ref <18)

## 2019-08-31 LAB — ECHOCARDIOGRAM COMPLETE
Height: 66 in
Weight: 2776.03 oz

## 2019-08-31 LAB — RESPIRATORY PANEL BY RT PCR (FLU A&B, COVID)
Influenza A by PCR: NEGATIVE
Influenza B by PCR: NEGATIVE
SARS Coronavirus 2 by RT PCR: NEGATIVE

## 2019-08-31 LAB — GLUCOSE, CAPILLARY: Glucose-Capillary: 67 mg/dL — ABNORMAL LOW (ref 70–99)

## 2019-08-31 LAB — MAGNESIUM: Magnesium: 1.8 mg/dL (ref 1.7–2.4)

## 2019-08-31 LAB — BRAIN NATRIURETIC PEPTIDE: B Natriuretic Peptide: 778 pg/mL — ABNORMAL HIGH (ref 0.0–100.0)

## 2019-08-31 LAB — TSH: TSH: 1.017 u[IU]/mL (ref 0.350–4.500)

## 2019-08-31 MED ORDER — ALBUTEROL SULFATE HFA 108 (90 BASE) MCG/ACT IN AERS
6.0000 | INHALATION_SPRAY | Freq: Once | RESPIRATORY_TRACT | Status: AC
  Administered 2019-08-31: 13:00:00 6 via RESPIRATORY_TRACT
  Filled 2019-08-31: qty 6.7

## 2019-08-31 MED ORDER — ONDANSETRON HCL 4 MG/2ML IJ SOLN: 4.0000 mg | Freq: Four times a day (QID) | INTRAMUSCULAR | Status: DC | PRN

## 2019-08-31 MED ORDER — INSULIN ASPART 100 UNIT/ML ~~LOC~~ SOLN
0.0000 [IU] | Freq: Three times a day (TID) | SUBCUTANEOUS | Status: DC
  Administered 2019-09-03: 13:00:00 2 [IU] via SUBCUTANEOUS

## 2019-08-31 MED ORDER — SODIUM CHLORIDE 0.9 % IV SOLN: 250.0000 mL | INTRAVENOUS | Status: DC | PRN

## 2019-08-31 MED ORDER — INSULIN ASPART 100 UNIT/ML ~~LOC~~ SOLN: 0.0000 [IU] | Freq: Every day | SUBCUTANEOUS | Status: DC

## 2019-08-31 MED ORDER — FUROSEMIDE 10 MG/ML IJ SOLN
40.0000 mg | Freq: Two times a day (BID) | INTRAMUSCULAR | Status: DC
  Administered 2019-08-31 – 2019-09-01 (×2): 40 mg via INTRAVENOUS
  Filled 2019-08-31 (×2): qty 4

## 2019-08-31 MED ORDER — APIXABAN 5 MG PO TABS
5.0000 mg | ORAL_TABLET | Freq: Two times a day (BID) | ORAL | Status: DC
  Administered 2019-08-31 – 2019-09-01 (×2): 5 mg via ORAL
  Filled 2019-08-31 (×2): qty 1

## 2019-08-31 MED ORDER — LOSARTAN POTASSIUM 50 MG PO TABS
25.0000 mg | ORAL_TABLET | Freq: Two times a day (BID) | ORAL | Status: DC
  Administered 2019-08-31: 22:00:00 25 mg via ORAL
  Filled 2019-08-31 (×2): qty 1

## 2019-08-31 MED ORDER — CYCLOBENZAPRINE HCL 10 MG PO TABS
5.0000 mg | ORAL_TABLET | Freq: Once | ORAL | Status: AC
  Administered 2019-09-01: 5 mg via ORAL
  Filled 2019-08-31: qty 1

## 2019-08-31 MED ORDER — MONTELUKAST SODIUM 10 MG PO TABS
10.0000 mg | ORAL_TABLET | Freq: Every day | ORAL | Status: DC
  Administered 2019-08-31 – 2019-09-03 (×4): 10 mg via ORAL
  Filled 2019-08-31 (×4): qty 1

## 2019-08-31 MED ORDER — TAMSULOSIN HCL 0.4 MG PO CAPS
0.4000 mg | ORAL_CAPSULE | Freq: Every day | ORAL | Status: DC
  Administered 2019-08-31 – 2019-09-03 (×4): 0.4 mg via ORAL
  Filled 2019-08-31 (×4): qty 1

## 2019-08-31 MED ORDER — IPRATROPIUM-ALBUTEROL 0.5-2.5 (3) MG/3ML IN SOLN
3.0000 mL | Freq: Four times a day (QID) | RESPIRATORY_TRACT | Status: DC | PRN
  Administered 2019-09-01 – 2019-09-03 (×3): 3 mL via RESPIRATORY_TRACT
  Filled 2019-08-31 (×3): qty 3

## 2019-08-31 MED ORDER — SODIUM CHLORIDE 0.9% FLUSH
3.0000 mL | Freq: Two times a day (BID) | INTRAVENOUS | Status: DC
  Administered 2019-08-31 – 2019-09-02 (×5): 3 mL via INTRAVENOUS

## 2019-08-31 MED ORDER — ACETAMINOPHEN 325 MG PO TABS
650.0000 mg | ORAL_TABLET | ORAL | Status: DC | PRN
  Administered 2019-09-02 – 2019-09-03 (×3): 650 mg via ORAL
  Filled 2019-08-31 (×3): qty 2

## 2019-08-31 MED ORDER — SODIUM CHLORIDE 0.9% FLUSH: 3.0000 mL | INTRAVENOUS | Status: DC | PRN

## 2019-08-31 MED ORDER — FUROSEMIDE 10 MG/ML IJ SOLN
60.0000 mg | Freq: Once | INTRAMUSCULAR | Status: AC
  Administered 2019-08-31: 15:00:00 60 mg via INTRAMUSCULAR
  Filled 2019-08-31: qty 6

## 2019-08-31 NOTE — H&P (Signed)
History and Physical    Gregory Mitchell [id number] DOB: May 31, 1941 DOA: 08/31/2019  Referring MD/NP/PA: Dr. Laverta Baltimore PCP: Neale Burly, MD  Patient coming from: Home  Chief Complaint: Shortness of breath, orthopnea and chest pressure  HPI: Gregory Mitchell is a 79 y.o. male with a past medical history significant for asthma/COPD, chronic respiratory failure (using 2 L nasal cannula supplementation at bedtime only), hypertension, type 2 diabetes mellitus with nephropathy, chronic kidney disease a stage IIIa and chronic diastolic heart failure; who presented to the emergency department secondary to increased shortness of breath and chest pressure.  Patient reports symptoms has been present for the last 4-5 days, unchanged and essentially experiencing 7 out of 10 chest pressure at rest worse without radiation, nonspecific aggravating or relieving factors reported.  Patient denies nausea, vomiting or diaphoresis.  No fever, no coughing.  Patient expressed positive orthopnea the night prior to his presentation to the ED.  Patient reported no dysuria, no melena, no hematochezia, no hematuria, no headaches, no blurred vision, no focal weakness or any other complaints. In the ED respiratory panel including evaluation for Covid came back to be negative; BMP was elevated and his chest x-ray demonstrated interstitial edema with vascular congestion.  Lasix has been given and cardiology service curbside given mild elevation in his troponin and chest pressure.  Recommendations provided for treatment of acute on chronic diastolic heart failure, echo and further evaluation.  TRH has been consulted.  Past Medical/Surgical History: Past Medical History:  Diagnosis Date  . Asthma   . Chronic renal insufficiency   . COPD (chronic obstructive pulmonary disease) ([place])   . Hypertension   . Renal insufficiency   . Type 2 diabetes mellitus ([place])     Past Surgical History:  Procedure Laterality Date  . APPENDECTOMY     . CHOLECYSTECTOMY    . NASAL SINUS SURGERY    . NEPHRECTOMY     RIGHT (Question CA.  No further therapy needed.)    Social History:  reports that he quit smoking about 43 years ago. His smoking use included cigarettes. He started smoking about 62 years ago. He has a 15.00 pack-year smoking history. He quit smokeless tobacco use about 13 years ago.  His smokeless tobacco use included chew. No history on file for alcohol and drug.  Allergies: Allergies  Allergen Reactions  . Amoxicillin     Makes eyes turn yellow and burn  . Ciprofloxacin     Pt not sure    Family History:  Family History  Problem Relation Age of Onset  . Lung cancer Brother   . Stroke Mother   . Breast cancer Mother     Prior to Admission medications   Medication Sig Start Date End Date Taking? Authorizing Provider  apixaban (ELIQUIS) 5 MG TABS tablet Take 5 mg by mouth 2 (two) times daily.   Yes [provider]  furosemide (LASIX) 20 MG tablet Take 20 mg by mouth daily. 08/28/19  Yes [provider]  losartan (COZAAR) 50 MG tablet TAKE 1 TABLET BY MOUTH TWICE DAILY Patient taking differently: Take 50 mg by mouth daily.  12/30/16  Yes Arnoldo Lenis, MD  methylPREDNISolone (MEDROL DOSEPAK) 4 MG TBPK tablet  08/24/19  Yes [provider]  sildenafil (REVATIO) 20 MG tablet SMARTSIG:1 Tablet(s) By Mouth As Needed 07/31/19  Yes [provider]  sildenafil (VIAGRA) 100 MG tablet TAKE 1 TABLET BY MOUTH 30 MINS PRIOR 04/10/19  Yes Branch, Alphonse Guild, MD  tamsulosin (FLOMAX) 0.4 MG CAPS capsule TAKE ONE CAPSULE BY MOUTH ONCE DAILY - 30 MINUTES FOLLOWING THE SAME MEAL EACH DAY 11/02/17  Yes [provider]  albuterol (PROAIR HFA) 108 (90 BASE) MCG/ACT inhaler Inhale 2 puffs into the lungs every 6 (six) hours as needed.    [provider]  amLODipine (NORVASC) 5 MG tablet TAKE 1 TABLET BY MOUTH EVERY DAY Patient not taking: Reported on 04/10/2019 08/10/16   Arnoldo Lenis, MD  ipratropium-albuterol (DUONEB) 0.5-2.5 (3) MG/3ML SOLN Take 3 mLs by nebulization every 6 (six) hours as needed.    [provider]  metFORMIN (GLUCOPHAGE-XR) 500 MG 24 hr tablet Take 1,000 mg by mouth 2 (two) times daily.    [provider]  montelukast (SINGULAIR) 10 MG tablet Take 10 mg by mouth at bedtime.    [provider]  Omega-3 Fatty Acids (FISH OIL) 1000 MG CAPS Take 1 capsule by mouth 2 (two) times daily.     [provider]    Review of Systems:  Negative except as otherwise mentioned in HPI.  Physical Exam: Vitals:   08/31/19 1430 08/31/19 1445 08/31/19 1500 08/31/19 1530  BP: 134/78  132/85 121/74  Pulse: 85 86 80 74  Resp: (!) 29 (!) 26 (!) 24 18  Temp:      TempSrc:      SpO2: 99% 100% 100% 100%  Weight:      Height:        Constitutional: NAD, calm, comfortable, no complaining of chest pain while at rest.  Patient describes some pressure on his left side.  Still feeling short of breath and complaining of orthopnea.  No fever, no nausea, no vomiting. Eyes: PERRL, lids and conjunctivae normal, no icterus, no nystagmus. ENMT: Mucous membranes are moist. Posterior pharynx clear of any exudate or lesions. Neck: normal, supple, no masses, no thyromegaly, no JVD appreciated on exam. Respiratory: Fine crackles at the bases, no wheezing, no using accessory muscles.  2 L nasal cannula supplementation in place. Cardiovascular: Regular rate and rhythm, no murmurs / rubs / gallops. No extremity edema. 2+ pedal pulses. No carotid bruits.  Abdomen: no tenderness, no masses palpated. No hepatosplenomegaly. Bowel sounds positive.  Musculoskeletal: no clubbing / cyanosis. No joint deformity upper and lower extremities. Good ROM, no contractures. Normal muscle tone.  Skin: no rashes, lesions, ulcers. No induration Neurologic: CN 2-12 grossly intact. Sensation intact, DTR normal. Strength 5/5 in all 4.  Psychiatric: Normal judgment  and insight. Alert and oriented x 3. Normal mood.    Labs on Admission: I have personally reviewed the following labs and imaging studies  CBC: Recent Labs  Lab 08/31/19 1220  WBC 3.9*  NEUTROABS 2.0  HGB 11.0*  HCT 37.6*  MCV 91.3  PLT [id number]   Basic Metabolic Panel: Recent Labs  Lab 08/31/19 1220  NA 139  K 4.0  CL 103  CO2 27  GLUCOSE 80  BUN 10  CREATININE 1.22  CALCIUM 8.7*  MG 1.8   GFR: Estimated Creatinine Clearance: 48.5 mL/min (by C-G formula based on SCr of 1.22 mg/dL).   Liver Function Tests: Recent Labs  Lab 08/31/19 1220  AST 22  ALT 14  ALKPHOS 34*  BILITOT 0.7  PROT 6.7  ALBUMIN 3.4*   CBG: Recent Labs  Lab 08/31/19 1726  GLUCAP 69*   Thyroid Function Tests: Recent Labs    08/31/19 1220  TSH 1.017    Recent Results (from the past 240 hour(s))  Respiratory Panel by RT PCR (Flu A&B, Covid) - Nasopharyngeal Swab     Status: None   Collection Time: 08/31/19 12:04 PM   Specimen: Nasopharyngeal Swab  Result Value Ref Range Status   SARS Coronavirus 2 by RT PCR NEGATIVE NEGATIVE Final    Comment: (NOTE) SARS-CoV-2 target nucleic acids are NOT DETECTED. The SARS-CoV-2 RNA is generally detectable in upper respiratoy specimens during the acute phase of infection. The lowest concentration of SARS-CoV-2 viral copies this assay can detect is 131 copies/mL. A negative result does not preclude SARS-Cov-2 infection and should not be used as the sole basis for treatment or other patient management decisions. A negative result may occur with  improper specimen collection/handling, submission of specimen other than nasopharyngeal swab, presence of viral mutation(s) within the areas targeted by this assay, and inadequate number of viral copies (<131 copies/mL). A negative result must be combined with clinical observations, patient history, and epidemiological information. The expected result is Negative. Fact Sheet for Patients:    PinkCheek.be Fact Sheet for Healthcare Providers:  GravelBags.it This test is not yet ap proved or cleared by the [place] FDA and  has been authorized for detection and/or diagnosis of SARS-CoV-2 by FDA under an Emergency Use Authorization (EUA). This EUA will remain  in effect (meaning this test can be used) for the duration of the COVID-19 declaration under Section 564(b)(1) of the Act, 21 U.S.C. section 360bbb-3(b)(1), unless the authorization is terminated or revoked sooner.    Influenza A by PCR NEGATIVE NEGATIVE Final   Influenza B by PCR NEGATIVE NEGATIVE Final    Comment: (NOTE) The Xpert Xpress SARS-CoV-2/FLU/RSV assay is intended as an aid in  the diagnosis of influenza from Nasopharyngeal swab specimens and  should not be used as a sole basis for treatment. Nasal washings and  aspirates are unacceptable for Xpert Xpress SARS-CoV-2/FLU/RSV  testing. Fact Sheet for Patients: PinkCheek.be Fact Sheet for Healthcare Providers: GravelBags.it This test is not yet approved or cleared by the [place] FDA and  has been authorized for detection and/or diagnosis of SARS-CoV-2 by  FDA under an Emergency Use Authorization (EUA). This EUA will remain  in effect (meaning this test can be used) for the duration of the  Covid-19 declaration under Section 564(b)(1) of the Act, 21  U.S.C. section 360bbb-3(b)(1), unless the authorization is  terminated or revoked. Performed at [facility], [address], [place], [place] [address]      Radiological Exams on Admission: DG Chest Portable 1 View  Result Date: 08/31/2019 CLINICAL DATA:  Shortness of breath.  Left chest pressure. EXAM: PORTABLE CHEST 1 VIEW COMPARISON:  10/21/2016. FINDINGS: Cardiomegaly mild pulmonary venous congestion. Diffuse bilateral interstitial prominence. Interstitial edema and/or pneumonitis  could present this fashion. Tiny bilateral pleural effusions. No pneumothorax. IMPRESSION: 1.  Cardiomegaly with pulmonary venous congestion. 2. Diffuse bilateral interstitial prominence interstitial edema and/or pneumonitis could present this fashion. Tiny bilateral pleural effusions. Electronically Signed   By: Marcello Moores  Register   On: 08/31/2019 12:39   ECHOCARDIOGRAM COMPLETE  Result Date: 08/31/2019   ECHOCARDIOGRAM REPORT   Patient Name:   Gregory Mitchell Date of Exam: 08/31/2019 Medical Rec #:  [id number]      Height:       66.0 in Accession #:    [id number]     Weight:       173.5 lb Date of Birth:  [birth year]      BSA:          1.88  m Patient Age:    4 years       BP:           121/74 mmHg Patient Gender: M              HR:           74 bpm. Exam Location:  [name] Procedure: 2D Echo Indications:    Congestive Heart Failure 428.0 / I50.9  History:        Patient has prior history of Echocardiogram examinations, most                 recent 05/28/2016. CHF, COPD, Signs/Symptoms:Chest Pain and                 Fever; Risk Factors:Hypertension, Diabetes and Former Smoker.                 Chronic renal insufficiency.  Sonographer:    [name] RDCS (AE) Referring Phys: [place]  1. Left ventricular ejection fraction, by visual estimation, is 25 to 30%. The left ventricle has severely decreased function. There is no left ventricular hypertrophy.  2. Elevated left ventricular end-diastolic pressure.  3. Left ventricular diastolic parameters are consistent with Grade II diastolic dysfunction (pseudonormalization).  4. Mild to moderately dilated left ventricular internal cavity size.  5. The left ventricle demonstrates global hypokinesis.  6. Global right ventricle has normal systolic function.The right ventricular size is normal. Mildly increased right ventricular wall thickness.  7. Left atrial size was severely dilated.  8. Right atrial size was moderately dilated.  9. The mitral  valve is grossly normal. Mild mitral valve regurgitation. 10. The tricuspid valve is grossly normal. 11. The tricuspid valve is grossly normal. Tricuspid valve regurgitation is mild. 12. The aortic valve is tricuspid. Aortic valve regurgitation is not visualized. No evidence of aortic valve sclerosis or stenosis. 13. The pulmonic valve was grossly normal. Pulmonic valve regurgitation is not visualized. 14. The inferior vena cava is normal in size with greater than 50% respiratory variability, suggesting right atrial pressure of 3 mmHg. FINDINGS  Left Ventricle: Left ventricular ejection fraction, by visual estimation, is 25 to 30%. The left ventricle has severely decreased function. The left ventricle demonstrates global hypokinesis. The left ventricular internal cavity size was mildly to moderately dilated left ventricle. There is no left ventricular hypertrophy. Left ventricular diastolic parameters are consistent with Grade II diastolic dysfunction (pseudonormalization). Elevated left ventricular end-diastolic pressure. Right Ventricle: The right ventricular size is normal. Mildly increased right ventricular wall thickness. Global RV systolic function is has normal systolic function. Left Atrium: Left atrial size was severely dilated. Right Atrium: Right atrial size was moderately dilated Pericardium: There is no evidence of pericardial effusion. Mitral Valve: The mitral valve is grossly normal. Mild mitral valve regurgitation. Tricuspid Valve: The tricuspid valve is grossly normal. Tricuspid valve regurgitation is mild. Aortic Valve: The aortic valve is tricuspid. Aortic valve regurgitation is not visualized. The aortic valve is structurally normal, with no evidence of sclerosis or stenosis. Pulmonic Valve: The pulmonic valve was grossly normal. Pulmonic valve regurgitation is not visualized. Pulmonic regurgitation is not visualized. Aorta: The aortic root is normal in size and structure. Venous: The inferior  vena cava is normal in size with greater than 50% respiratory variability, suggesting right atrial pressure of 3 mmHg. IAS/Shunts: No atrial level shunt detected by color flow Doppler.  LEFT VENTRICLE PLAX 2D LVIDd:         6.37 cm  Diastology LVIDs:         5.33 cm  LV e' lateral:   11.00 cm/s LV PW:         1.08 cm  LV E/e' lateral: 6.7 LV IVS:        0.89 cm  LV e' medial:    4.13 cm/s LVOT diam:     2.30 cm  LV E/e' medial:  17.7 LV SV:         69 ml LV SV Index:   35.78 LVOT Area:     4.15 cm  RIGHT VENTRICLE RV S prime:     19.40 cm/s TAPSE (M-mode): 3.1 cm LEFT ATRIUM             Index       RIGHT ATRIUM           Index LA diam:        4.80 cm 2.55 cm/m  RA Area:     21.60 cm LA Vol (A2C):   89.8 ml 47.70 ml/m RA Volume:   71.90 ml  38.19 ml/m LA Vol (A4C):   79.5 ml 42.23 ml/m LA Biplane Vol: 87.2 ml 46.32 ml/m   AORTA Ao Root diam: 3.10 cm MITRAL VALVE MV Area (PHT): 5.13 cm             SHUNTS MV PHT:        42.92 msec           Systemic Diam: 2.30 cm MV Decel Time: 148 msec MV E velocity: 73.20 cm/s 103 cm/s MV A velocity: 37.30 cm/s 70.3 cm/s MV E/A ratio:  1.96       1.5  Kate Sable MD Electronically signed by Kate Sable MD Signature Date/Time: 08/31/2019/4:26:13 PM    Final     EKG: Independently reviewed.  No acute ischemic changes.  Assessment/Plan 1-Acute on chronic diastolic CHF (congestive heart failure) (HCC) -Elevated BNP and chest x-ray demonstrated vascular congestion with interstitial pulmonary edema. -Mild elevation in his troponin secondary to demand ischemia and CHF exacerbation. -Patient will be admitted to telemetry bed -Check 2D echo -Daily weights, low-sodium diet and strict I's and O's -IV Lasix initiated in the ED will follow response and adjust as needed. -Wean off oxygen supplementation as tolerated (patient uses 2 L nasal cannula supplementation at bedtime and only) -Continue therapy -No beta-blockers in the setting of COPD -Will check TSH and  magnesium level.  2-Chronic renal insufficiency -Secondary to type 2 diabetes mellitus -Avoid/minimize nephrotoxic agents as much as possible -Close monitoring while actively diuresing -Appears to be at baseline currently. -CKD Stage III a -Close monitoring of electrolytes.  3-Type 2 diabetes mellitus (HCC) -Check A1c -Hold oral hypoglycemic agents and use a sliding scale insulin.  4-HTN (hypertension) -Appears stable and well-controlled -Will continue the use of amlodipine and ARB -Heart healthy diet has been discussed with patient  5-COPD (chronic obstructive pulmonary disease) (HCC) -Using 2 L nasal cannula supplementation at bedtime only -No wheezing on exam. -Continue home nebulizers/bronchodilators regimen  6-Acute on chronic respiratory failure with hypoxia ([place]) -Treatment as mentioned above in the setting of interstitial edema and vascular congestion from acute on chronic diastolic heart failure.  7-history of bilateral pulmonary embolism -Continue Eliquis  8-BPH -Continue the use of Flomax.  DVT prophylaxis: Patient is chronically on Eliquis Code Status: Full code Family Communication: No family at bedside Disposition Plan: Hopefully discharge back home once volume status and pulmonary edema stabilized. Consults called: Cardiology service was curbside by  the EDP (Dr. Bronson Ing) recommendations given to check 2D echo and treat patient for acute on chronic diastolic heart failure.  Doubt ischemic component. Admission status: Length of stay more than 2 midnights; inpatient status, telemetry bed.   Time Spent: 70 minutes  Barton Dubois MD [facility] Pager [phone]   08/31/2019, 5:33 PM

## 2019-08-31 NOTE — ED Provider Notes (Signed)
Emergency Department Provider Note   I have reviewed the triage vital signs and the nursing notes.   HISTORY  Chief Complaint Chest Pain   HPI Gregory Mitchell is a 79 y.o. male with PMH of COPD, DM, and CKD presents to the ED with chest pressure and SOB for the last 5 days. Patient awoke with symptoms 5 days prior and pain is constant and non-radiating. No fever but has had chills and soaking sweats worse at night. No sick contacts. He was seen in the [place] ED 5 days prior, by patient report, and was sent home with "a fluid pill" but no other treatment.  Patient was referred to follow-up with his cardiologist, Dr. Harl Bowie, but with worsening shortness of breath the patient presents to the emergency department for evaluation.   Past Medical History:  Diagnosis Date  . Asthma   . Chronic renal insufficiency   . COPD (chronic obstructive pulmonary disease) ([place])   . Hypertension   . Renal insufficiency   . Type 2 diabetes mellitus [facility])     Patient Active Problem List   Diagnosis Date Noted  . COPD (chronic obstructive pulmonary disease) ([place]) 12/21/2013  . Bronchiectasis ([place]) 12/21/2013  . Dyspnea on exertion 08/23/2012  . HTN (hypertension) 08/23/2012  . Fever 02/11/2012  . Obesity 01/11/2012  . Chest pain radiating to arm 11/29/2011  . Cardiomyopathy, secondary ([place]) 11/29/2011  . Chronic renal insufficiency   . Type 2 diabetes mellitus ([place])     Past Surgical History:  Procedure Laterality Date  . APPENDECTOMY    . CHOLECYSTECTOMY    . NASAL SINUS SURGERY    . NEPHRECTOMY     RIGHT (Question CA.  No further therapy needed.)    Allergies Amoxicillin and Ciprofloxacin  Family History  Problem Relation Age of Onset  . Lung cancer Brother   . Stroke Mother   . Breast cancer Mother     Social History Social History   Tobacco Use  . Smoking status: Former Smoker    Packs/day: 1.00    Years: 15.00    Pack years: 15.00    Types: Cigarettes    Start  date: 07/14/1957    Quit date: 08/09/1976    Years since quitting: 43.0  . Smokeless tobacco: Former Systems developer    Types: Chew    Quit date: 02/10/2006  . Tobacco comment: chewed tobacco for 10 years  Substance Use Topics  . Alcohol use: Not on file  . Drug use: Not on file    Review of Systems  Constitutional: No fever/chills but night sweats.  Eyes: No visual changes. ENT: No sore throat. Cardiovascular: Positive chest pain/pressure. Respiratory: Positive shortness of breath. Gastrointestinal: No abdominal pain.  No nausea, no vomiting.  No diarrhea.  No constipation. Genitourinary: Negative for dysuria. Musculoskeletal: Negative for back pain. Skin: Negative for rash. Neurological: Negative for headaches, focal weakness or numbness.  10-point ROS otherwise negative.  ____________________________________________   PHYSICAL EXAM:  VITAL SIGNS: ED Triage Vitals  Enc Vitals Group     BP 08/31/19 1146 124/72     Pulse Rate 08/31/19 1146 85     Resp 08/31/19 1146 19     Temp 08/31/19 1146 98.8 F (37.1 C)     Temp Source 08/31/19 1146 Oral     SpO2 08/31/19 1146 99 %     Weight 08/31/19 1136 173 lb 8 oz (78.7 kg)     Height 08/31/19 1136 5\' 6"  (1.676 m)   Constitutional:  Alert and oriented. Well appearing and in no acute distress. Eyes: Conjunctivae are normal. Head: Atraumatic. Nose: No congestion/rhinnorhea. Mouth/Throat: Mucous membranes are moist. Neck: No stridor.   Cardiovascular: Normal rate, regular rhythm. Good peripheral circulation. Grossly normal heart sounds.   Respiratory: Normal respiratory effort.  No retractions. Lungs CTAB. Gastrointestinal: Soft and nontender. No distention.  Musculoskeletal: No lower extremity tenderness nor edema. No gross deformities of extremities. Neurologic:  Normal speech and language.  Skin:  Skin is warm, dry and intact. No rash noted.  ____________________________________________   LABS (all labs ordered are listed, but only  abnormal results are displayed)  Labs Reviewed  COMPREHENSIVE METABOLIC PANEL - Abnormal; Notable for the following components:      Result Value   Calcium 8.7 (*)    Albumin 3.4 (*)    Alkaline Phosphatase 34 (*)    GFR calc non Af Amer 56 (*)    All other components within normal limits  BRAIN NATRIURETIC PEPTIDE - Abnormal; Notable for the following components:   B Natriuretic Peptide 778.0 (*)    All other components within normal limits  CBC WITH DIFFERENTIAL/PLATELET - Abnormal; Notable for the following components:   WBC 3.9 (*)    RBC 4.12 (*)    Hemoglobin 11.0 (*)    HCT 37.6 (*)    MCHC 29.3 (*)    All other components within normal limits  TROPONIN I (HIGH SENSITIVITY) - Abnormal; Notable for the following components:   Troponin I (High Sensitivity) 39 (*)    All other components within normal limits  RESPIRATORY PANEL BY RT PCR (FLU A&B, COVID)  TROPONIN I (HIGH SENSITIVITY)   ____________________________________________  EKG   EKG Interpretation  Date/Time:  Friday August 31 2019 12:10:09 EST Ventricular Rate:  78 PR Interval:    QRS Duration: 146 QT Interval:  453 QTC Calculation: 516 R Axis:   -81 Text Interpretation: Sinus rhythm Ventricular premature complex RBBB and LAFB Probable left ventricular hypertrophy No STEMI Confirmed by Nanda Quinton [phone]) on 08/31/2019 12:20:02 PM       ____________________________________________  RADIOLOGY  DG Chest Portable 1 View  Result Date: 08/31/2019 CLINICAL DATA:  Shortness of breath.  Left chest pressure. EXAM: PORTABLE CHEST 1 VIEW COMPARISON:  10/21/2016. FINDINGS: Cardiomegaly mild pulmonary venous congestion. Diffuse bilateral interstitial prominence. Interstitial edema and/or pneumonitis could present this fashion. Tiny bilateral pleural effusions. No pneumothorax. IMPRESSION: 1.  Cardiomegaly with pulmonary venous congestion. 2. Diffuse bilateral interstitial prominence interstitial edema and/or  pneumonitis could present this fashion. Tiny bilateral pleural effusions. Electronically Signed   By: Marcello Moores  Register   On: 08/31/2019 12:39    ____________________________________________   PROCEDURES  Procedure(s) performed:   Procedures  CRITICAL CARE Performed by: Margette Fast Total critical care time: 35 minutes Critical care time was exclusive of separately billable procedures and treating other patients. Critical care was necessary to treat or prevent imminent or life-threatening deterioration. Critical care was time spent personally by me on the following activities: development of treatment plan with patient and/or surrogate as well as nursing, discussions with consultants, evaluation of patient's response to treatment, examination of patient, obtaining history from patient or surrogate, ordering and performing treatments and interventions, ordering and review of laboratory studies, ordering and review of radiographic studies, pulse oximetry and re-evaluation of patient's condition.  Nanda Quinton, MD Emergency Medicine  ____________________________________________   INITIAL IMPRESSION / ASSESSMENT AND PLAN / ED COURSE  Pertinent labs & imaging results that were available during my care  of the patient were reviewed by me and considered in my medical decision making (see chart for details).   Patient presents to the emergency department for evaluation of shortness of breath with chest pressure. CXR with edema vs pneumonitis on CXR.  No lower extremity swelling on exam.  Question flulike symptoms with night sweats but no documented fever either here or at home. COVID PCR negative. Patient with some mild tachypnea but no acute distress.   Spoke with Dr. Bronson Ing with Cardiology regarding case. No plan for provocative testing. Agrees with plan for diuresis and troponin trending.   Patient with sats in the low 90s to as low as 85% on RA with movement/talking. Patient placed on  2L [place].   Discussed patient's case with [facility], Dr. Dyann Kief to request admission. Patient and family (if present) updated with plan. Care transferred to [facility] service.  I reviewed all nursing notes, vitals, pertinent old records, EKGs, labs, imaging (as available).  ____________________________________________  FINAL CLINICAL IMPRESSION(S) / ED DIAGNOSES  Final diagnoses:  Precordial chest pain  Acute pulmonary edema (HCC)    MEDICATIONS GIVEN DURING THIS VISIT:  Medications  albuterol (VENTOLIN HFA) 108 (90 Base) MCG/ACT inhaler 6 puff (6 puffs Inhalation Given 08/31/19 1300)  furosemide (LASIX) injection 60 mg (60 mg Intramuscular Given 08/31/19 1439)    Note:  This document was prepared using Dragon voice recognition software and may include unintentional dictation errors.  Nanda Quinton, MD, [facility] Emergency Medicine    Shaylynne Lunt, [facility], MD 08/31/19 [phone]

## 2019-08-31 NOTE — ED Triage Notes (Signed)
Pt presents to ED with complaints of left sided chest pressure and SOB started 3-4 days ago. Pt denies radiation of pain.

## 2019-08-31 NOTE — ED Notes (Signed)
Family at bedside. 

## 2019-08-31 NOTE — ED Notes (Signed)
Pt was given some juice and crackers to bring sugar back up.

## 2019-08-31 NOTE — Telephone Encounter (Signed)
Patient canceled visit for today because his symptoms of chest pain and SOB are worse and he is going to the ED for an evaluation.

## 2019-08-31 NOTE — Progress Notes (Signed)
*  PRELIMINARY RESULTS* Echocardiogram 2D Echocardiogram has been performed.  [name] 08/31/2019, 3:57 PM

## 2019-09-01 ENCOUNTER — Inpatient Hospital Stay (HOSPITAL_COMMUNITY): Payer: Medicare HMO

## 2019-09-01 DIAGNOSIS — R0602 Shortness of breath: Secondary | ICD-10-CM

## 2019-09-01 DIAGNOSIS — R072 Precordial pain: Secondary | ICD-10-CM

## 2019-09-01 LAB — BASIC METABOLIC PANEL
Anion gap: 9 (ref 5–15)
BUN: 12 mg/dL (ref 8–23)
CO2: 29 mmol/L (ref 22–32)
Calcium: 8.8 mg/dL — ABNORMAL LOW (ref 8.9–10.3)
Chloride: 99 mmol/L (ref 98–111)
Creatinine, Ser: 1.36 mg/dL — ABNORMAL HIGH (ref 0.61–1.24)
GFR calc Af Amer: 57 mL/min — ABNORMAL LOW (ref >60)
GFR calc non Af Amer: 49 mL/min — ABNORMAL LOW (ref >60)
Glucose, Bld: 88 mg/dL (ref 70–99)
Potassium: 3.8 mmol/L (ref 3.5–5.1)
Sodium: 137 mmol/L (ref 135–145)

## 2019-09-01 LAB — GLUCOSE, CAPILLARY
Glucose-Capillary: 150 mg/dL — ABNORMAL HIGH (ref 70–99)
Glucose-Capillary: 86 mg/dL (ref 70–99)
Glucose-Capillary: 93 mg/dL (ref 70–99)
Glucose-Capillary: 77 mg/dL (ref 70–99)
Glucose-Capillary: 87 mg/dL (ref 70–99)

## 2019-09-01 MED ORDER — FUROSEMIDE 10 MG/ML IJ SOLN: 40.0000 mg | Freq: Every day | INTRAMUSCULAR | Status: DC

## 2019-09-01 MED ORDER — FENTANYL CITRATE (PF) 100 MCG/2ML IJ SOLN
50.0000 ug | Freq: Once | INTRAMUSCULAR | Status: AC
  Administered 2019-09-01: 12:00:00 50 ug via INTRAVENOUS
  Filled 2019-09-01: qty 2

## 2019-09-01 MED ORDER — LOSARTAN POTASSIUM 25 MG PO TABS: 25.0000 mg | ORAL_TABLET | Freq: Every day | ORAL | Status: DC

## 2019-09-01 MED ORDER — HEPARIN (PORCINE) 25000 UT/250ML-% IV SOLN
1250.0000 [IU]/h | INTRAVENOUS | Status: DC
  Administered 2019-09-01: 22:00:00 800 [IU]/h via INTRAVENOUS
  Filled 2019-09-01 (×3): qty 250

## 2019-09-01 MED ORDER — METOPROLOL TARTRATE 12.5 MG HALF TABLET
12.5000 mg | ORAL_TABLET | Freq: Two times a day (BID) | ORAL | Status: DC
  Administered 2019-09-01 – 2019-09-02 (×2): 12.5 mg via ORAL
  Filled 2019-09-01 (×4): qty 1

## 2019-09-01 NOTE — Progress Notes (Signed)
ANTICOAGULATION CONSULT NOTE - Initial Consult  Pharmacy Consult for heparin gtt  Indication: chest pain/ACS  Allergies  Allergen Reactions  . Amoxicillin     Makes eyes turn yellow and burn  . Ciprofloxacin     Pt not sure    Patient Measurements: Height: 5\' 6"  (167.6 cm) Weight: 146 lb 12.5 oz (66.6 kg) IBW/kg (Calculated) : 63.8 Heparin Dosing Weight: HEPARIN DW (KG): 66   Vital Signs: Temp: 97.3 F (36.3 C) (01/23 1146) Temp Source: Oral (01/23 1146) BP: 92/60 (01/23 1146) Pulse Rate: 85 (01/23 1146)  Labs: Recent Labs    08/31/19 1220 09/01/19 0610  HGB 11.0*  --   HCT 37.6*  --   PLT 272  --   CREATININE 1.22 1.36*    Estimated Creatinine Clearance: 39.7 mL/min (A) (by C-G formula based on SCr of 1.36 mg/dL (H)).   Medical History: Past Medical History:  Diagnosis Date  . Asthma   . Chronic renal insufficiency   . COPD (chronic obstructive pulmonary disease) ([place])   . Hypertension   . Renal insufficiency   . Type 2 diabetes mellitus (HCC)     Medications:  Medications Prior to Admission  Medication Sig Dispense Refill Last Dose  . albuterol (PROAIR HFA) 108 (90 BASE) MCG/ACT inhaler Inhale 2 puffs into the lungs every 6 (six) hours as needed for wheezing or shortness of breath.    unknown  . apixaban (ELIQUIS) 5 MG TABS tablet Take 5 mg by mouth 2 (two) times daily.   08/30/2019 at Unknown time  . ipratropium-albuterol (DUONEB) 0.5-2.5 (3) MG/3ML SOLN Take 3 mLs by nebulization every 6 (six) hours as needed (for shortness of breath/wheezing).    unknown  . losartan (COZAAR) 50 MG tablet TAKE 1 TABLET BY MOUTH TWICE DAILY (Patient taking differently: Take 50 mg by mouth daily. ) 60 tablet 6 08/30/2019 at Unknown time  . metFORMIN (GLUCOPHAGE-XR) 500 MG 24 hr tablet Take 1,000 mg by mouth 2 (two) times daily.   08/30/2019 at Unknown time  . methylPREDNISolone (MEDROL DOSEPAK) 4 MG TBPK tablet Take 4-24 mg by mouth See admin instructions. Take as directed  per package instructions for 6 days starting on 08/24/2019   08/30/2019 at Unknown time  . Omega-3 Fatty Acids (FISH OIL) 1000 MG CAPS Take 1 capsule by mouth 2 (two) times daily.    08/30/2019 at Unknown time  . OXYGEN Inhale 2 L into the lungs at bedtime as needed (for shortness of breath).   Past Week at Unknown time  . tamsulosin (FLOMAX) 0.4 MG CAPS capsule Take 0.4 mg by mouth daily.   0 08/30/2019 at Unknown time   Scheduled:  . insulin aspart  0-5 Units Subcutaneous QHS  . insulin aspart  0-9 Units Subcutaneous TID WC  . [START ON 09/02/2019] losartan  25 mg Oral Daily  . metoprolol tartrate  12.5 mg Oral BID  . montelukast  10 mg Oral QHS  . sodium chloride flush  3 mL Intravenous Q12H  . tamsulosin  0.4 mg Oral QPC supper   Infusions:  . sodium chloride    . heparin     PRN: sodium chloride, acetaminophen, ipratropium-albuterol, ondansetron (ZOFRAN) IV, sodium chloride flush Anti-infectives (From admission, onward)   None      Assessment: Gregory Mitchell a 79 y.o. male requires anticoagulation with a heparin iv infusion for the indication of  chest pain/ACS. Heparin gtt will be started following pharmacy protocol per pharmacy consult. Patient is on previous  oral anticoagulant that will require aPTT/HL correlation before transitioning to only HL monitoring. Last dose of apixaban 9am on 1/23    Goal of Therapy:  Heparin level 0.3-0.7 units/ml Monitor platelets by anticoagulation protocol: Yes   Plan:  Give 0 units bolus x 1 Start heparin infusion at 800 units/hr Check anti-Xa level in 8 hours and daily while on heparin Continue to monitor H&H and platelets  Heparin level to be drawn in 8 hours for patients >67 years old  Gregory Mitchell Gregory Mitchell 09/01/2019,12:37 PM

## 2019-09-01 NOTE — Progress Notes (Signed)
PROGRESS NOTE    Gregory Mitchell  [id number] DOB: October 25, 1940 DOA: 08/31/2019 PCP: Neale Burly, MD     Brief Narrative:  79 y.o. male with a past medical history significant for asthma/COPD, chronic respiratory failure (using 2 L nasal cannula supplementation at bedtime only), hypertension, type 2 diabetes mellitus with nephropathy, chronic kidney disease a stage IIIa and chronic diastolic heart failure; who presented to the emergency department secondary to increased shortness of breath and chest pressure.  Patient reports symptoms has been present for the last 4-5 days, unchanged and essentially experiencing 7 out of 10 chest pressure at rest worse without radiation, nonspecific aggravating or relieving factors reported.  Patient denies nausea, vomiting or diaphoresis.  No fever, no coughing.  Patient expressed positive orthopnea the night prior to his presentation to the ED.  Patient reported no dysuria, no melena, no hematochezia, no hematuria, no headaches, no blurred vision, no focal weakness or any other complaints. In the ED respiratory panel including evaluation for Covid came back to be negative; BMP was elevated and his chest x-ray demonstrated interstitial edema with vascular congestion.  Lasix has been given and cardiology service curbside given mild elevation in his troponin and chest pressure.  Recommendations provided for treatment of acute on chronic diastolic heart failure, echo and further evaluation.  TRH has been consulted.   Assessment & Plan: 1-acute on chronic respiratory failure with hypoxia, in the setting of acute on chronic combined CHF -Elevated BNP and vascular congestion on chest x-ray on presentation -Mild interstitial pulmonary edema also described. -After diuresis repeat x-ray demonstrating just left lower lobe atelectasis/infiltrate changes. -No fever, no coughing, no elevation of WBCs to suspect acute infection. -Patient creatinine went up and diuresis  will have in place on home at this time. -2D echo demonstrated worsening ejection fraction and global hypokinesis (changes that are new for him). -Further discussion with cardiology service retrieve recommendations to stop Eliquis, start heparin drip, start low-dose Lopressor and adjust losartan regimen. -Patient will be transferred to [facility] for further evaluation by cardiology service including heart cath.  2-chest pain -Initially thought to be related to acute on chronic CHF -Echocardiogram demonstrating decreased ejection fraction and global hypokinesis -Dyspnea changes as trigger cardiology service recommendations for heart cath evaluation and further management. -Patient started on heparin drip.  3-chronic renal insufficiency -Creatinine mildly elevated after acute diuresis -Baseline stage III AAA -Will continue to avoid/minimize nephrotoxic agents as much as possible -Close monitoring of creatinine trend electrolytes will be followed.  4-type 2 diabetes with nephropathy -Continue holding oral hypoglycemic agents -Patient started on sliding scale insulin -A1c 6.1  5-history of BPH -Continue Flomax. -No complaints of urinary retention.  6-history of hypertension -Appears to be stable/slightly soft after acute diuresis. -Diuretic has been discontinued at this moment -Losartan dose has been adjusted -Low-dose Lopressor started -Follow vital signs.  7-history of COPD -2 L nasal cannula supplementation at bedtime and chronically -At this moment of oxygen supplementation during the day. -Very little wheezing on exam, patient reports to be chronic and intermittently present. -Continue the use of nebulizers/bronchodilators regimen. -Normal respiratory effort.  8-prior history of bilateral pulmonary embolism -Patient was chronically on Eliquis -Resume Eliquis when cardiac work-up completed.   DVT prophylaxis: Previously on Eliquis; now transition to heparin  drip Code Status: Full code Family Communication: Wife over the phone Disposition Plan: Stop Eliquis, heparin drip to be started, start Lopressor and transfer patient to [facility] for further cardiology evaluation and management.  Consultants:   Cardiology service: Given global hypokinesis and change in his ejection fraction, along with chest pain; recommendations are for patient to be transferred to [facility] for further evaluation by cardiology service and determining the right timing for heart cath.  Lopressor low-dose has been started, no further diuresis at his renal function (creatinine has increased on and there is no signs of fluid overload on exam).  Stop Eliquis and start patient on heparin drip.  Procedures:   2D echo:   1. Left ventricular ejection fraction, by visual estimation, is 25 to 30%. The left ventricle has severely decreased function. There is no left ventricular hypertrophy.  2. Elevated left ventricular end-diastolic pressure.  3. Left ventricular diastolic parameters are consistent with Grade II diastolic dysfunction (pseudonormalization).  4. Mild to moderately dilated left ventricular internal cavity size.  5. The left ventricle demonstrates global hypokinesis.  6. Global right ventricle has normal systolic function.The right ventricular size is normal. Mildly increased right ventricular wall thickness.  7. Left atrial size was severely dilated.  8. Right atrial size was moderately dilated.  9. The mitral valve is grossly normal. Mild mitral valve regurgitation. 10. The tricuspid valve is grossly normal. 11. The tricuspid valve is grossly normal. Tricuspid valve regurgitation is mild. 12. The aortic valve is tricuspid. Aortic valve regurgitation is not visualized. No evidence of aortic valve sclerosis or stenosis. 13. The pulmonic valve was grossly normal. Pulmonic valve regurgitation is not visualized. 14. The inferior vena cava is normal in  size with greater than 50% respiratory variability, suggesting right atrial pressure of 3 mmHg.  FINDINGS  Left Ventricle: Left ventricular ejection fraction, by visual estimation, is 25 to 30%. The left ventricle has severely decreased function. The left ventricle demonstrates global hypokinesis. The left ventricular internal cavity size was mildly to  moderately dilated left ventricle. There is no left ventricular hypertrophy. Left ventricular diastolic parameters are consistent with Grade II diastolic dysfunction (pseudonormalization). Elevated left ventricular end-diastolic pressure.  Right Ventricle: The right ventricular size is normal. Mildly increased right ventricular wall thickness. Global RV systolic function is has normal systolic function.  Left Atrium: Left atrial size was severely dilated.  Right Atrium: Right atrial size was moderately dilated  Pericardium: There is no evidence of pericardial effusion.  Mitral Valve: The mitral valve is grossly normal. Mild mitral valve regurgitation.  Tricuspid Valve: The tricuspid valve is grossly normal. Tricuspid valve regurgitation is mild.  Aortic Valve: The aortic valve is tricuspid. Aortic valve regurgitation is not visualized. The aortic valve is structurally normal, with no evidence of sclerosis or stenosis. Antimicrobials:  Anti-infectives (From admission, onward)   None       Subjective: No fever, no nausea, no vomiting.  Patient still reporting intermittent chest discomfort 10 out of 10 not radiated on the left side of his chest, when present shortness of breath is associated.  Good urine output, acute response and requiring oxygen supplementation after diuresis and 08/31/2019.  Objective: Vitals:   08/31/19 2030 09/01/19 0615 09/01/19 1052 09/01/19 1146  BP:  101/63  92/60  Pulse:  84  85  Resp:  18  18  Temp:  98 F (36.7 C)  (!) 97.3 F (36.3 C)  TempSrc:  Oral  Oral  SpO2:  100% 95% 100%  Weight: 66 kg  66.6 kg    Height: 5\' 6"  (1.676 m)       Intake/Output Summary (Last 24 hours) at 09/01/2019 1412 Last data filed at 09/01/2019 1300  Gross per 24 hour  Intake 480 ml  Output 2350 ml  Net -1870 ml   Filed Weights   08/31/19 1136 08/31/19 2030 09/01/19 0615  Weight: 78.7 kg 66 kg 66.6 kg    Examination: General exam: Alert, awake, oriented x 3, still having chest pain, no nausea, no vomiting.  Reports good urine output after diuresis on 08/31/2019 and is breathing easier. Respiratory system: Positive rhonchi right, mild expiratory wheezing (patient said that he experiences intermittently chronically), no using accessory muscles.  Normal respiratory effort.  Still reporting mild orthopnea.  Good O2 sat on room air.   Cardiovascular system: RRR. No murmurs, rubs, gallops. Gastrointestinal system: Abdomen is nondistended, soft and nontender. No organomegaly or masses felt. Normal bowel sounds heard. Central nervous system: Alert and oriented. No focal neurological deficits. Extremities: No C/C/E, +pedal pulses Skin: No rashes, lesions or ulcers Psychiatry: Judgement and insight appear normal. Mood & affect appropriate.    Data Reviewed: I have personally reviewed following labs and imaging studies  CBC: Recent Labs  Lab 08/31/19 1220  WBC 3.9*  NEUTROABS 2.0  HGB 11.0*  HCT 37.6*  MCV 91.3  PLT [id number]   Basic Metabolic Panel: Recent Labs  Lab 08/31/19 1220 09/01/19 0610  NA 139 137  K 4.0 3.8  CL 103 99  CO2 27 29  GLUCOSE 80 88  BUN 10 12  CREATININE 1.22 1.36*  CALCIUM 8.7* 8.8*  MG 1.8  --    GFR: Estimated Creatinine Clearance: 39.7 mL/min (A) (by C-G formula based on SCr of 1.36 mg/dL (H)).   Liver Function Tests: Recent Labs  Lab 08/31/19 1220  AST 22  ALT 14  ALKPHOS 34*  BILITOT 0.7  PROT 6.7  ALBUMIN 3.4*   HbA1C: Recent Labs    08/31/19 1220  HGBA1C 6.1*   CBG: Recent Labs  Lab 08/31/19 1852 08/31/19 2139 08/31/19 2307 09/01/19 0758  09/01/19 1154  GLUCAP 160* 67* 150* 86 93   Thyroid Function Tests: Recent Labs    08/31/19 1220  TSH 1.017    Recent Results (from the past 240 hour(s))  Respiratory Panel by RT PCR (Flu A&B, Covid) - Nasopharyngeal Swab     Status: None   Collection Time: 08/31/19 12:04 PM   Specimen: Nasopharyngeal Swab  Result Value Ref Range Status   SARS Coronavirus 2 by RT PCR NEGATIVE NEGATIVE Final    Comment: (NOTE) SARS-CoV-2 target nucleic acids are NOT DETECTED. The SARS-CoV-2 RNA is generally detectable in upper respiratoy specimens during the acute phase of infection. The lowest concentration of SARS-CoV-2 viral copies this assay can detect is 131 copies/mL. A negative result does not preclude SARS-Cov-2 infection and should not be used as the sole basis for treatment or other patient management decisions. A negative result may occur with  improper specimen collection/handling, submission of specimen other than nasopharyngeal swab, presence of viral mutation(s) within the areas targeted by this assay, and inadequate number of viral copies (<131 copies/mL). A negative result must be combined with clinical observations, patient history, and epidemiological information. The expected result is Negative. Fact Sheet for Patients:  PinkCheek.be Fact Sheet for Healthcare Providers:  GravelBags.it This test is not yet ap proved or cleared by the [place] FDA and  has been authorized for detection and/or diagnosis of SARS-CoV-2 by FDA under an Emergency Use Authorization (EUA). This EUA will remain  in effect (meaning this test can be used) for the duration of the COVID-19 declaration under Section 564(b)(1) of  the Act, 21 U.S.C. section 360bbb-3(b)(1), unless the authorization is terminated or revoked sooner.    Influenza A by PCR NEGATIVE NEGATIVE Final   Influenza B by PCR NEGATIVE NEGATIVE Final    Comment: (NOTE) The  Xpert Xpress SARS-CoV-2/FLU/RSV assay is intended as an aid in  the diagnosis of influenza from Nasopharyngeal swab specimens and  should not be used as a sole basis for treatment. Nasal washings and  aspirates are unacceptable for Xpert Xpress SARS-CoV-2/FLU/RSV  testing. Fact Sheet for Patients: PinkCheek.be Fact Sheet for Healthcare Providers: GravelBags.it This test is not yet approved or cleared by the [place] FDA and  has been authorized for detection and/or diagnosis of SARS-CoV-2 by  FDA under an Emergency Use Authorization (EUA). This EUA will remain  in effect (meaning this test can be used) for the duration of the  Covid-19 declaration under Section 564(b)(1) of the Act, 21  U.S.C. section 360bbb-3(b)(1), unless the authorization is  terminated or revoked. Performed at [facility], [address] [place], [place] [address]      Radiology Studies: DG Chest 2 View  Result Date: 09/01/2019 CLINICAL DATA:  Shortness of breath and chest pain for several days, history asthma, hypertension, diabetes mellitus, negative for COVID-19 on 01/22 EXAM: CHEST - 2 VIEW COMPARISON:  08/31/2019 FINDINGS: Minimal enlargement of cardiac silhouette. Mediastinal contours and pulmonary vascularity normal. LEFT lower lobe consolidation consistent with pneumonia. Remaining lungs minimally hyperinflated but clear. No pleural effusion or pneumothorax. IMPRESSION: Minimal enlargement of cardiac silhouette. LEFT lower lobe infiltrate consistent with pneumonia. Electronically Signed   By: Lavonia Dana M.D.   On: 09/01/2019 13:49   DG Chest Portable 1 View  Result Date: 08/31/2019 CLINICAL DATA:  Shortness of breath.  Left chest pressure. EXAM: PORTABLE CHEST 1 VIEW COMPARISON:  10/21/2016. FINDINGS: Cardiomegaly mild pulmonary venous congestion. Diffuse bilateral interstitial prominence. Interstitial edema and/or pneumonitis could present this  fashion. Tiny bilateral pleural effusions. No pneumothorax. IMPRESSION: 1.  Cardiomegaly with pulmonary venous congestion. 2. Diffuse bilateral interstitial prominence interstitial edema and/or pneumonitis could present this fashion. Tiny bilateral pleural effusions. Electronically Signed   By: Marcello Moores  Register   On: 08/31/2019 12:39   ECHOCARDIOGRAM COMPLETE  Result Date: 08/31/2019   ECHOCARDIOGRAM REPORT   Patient Name:   Gregory Mitchell Date of Exam: 08/31/2019 Medical Rec #:  [id number]      Height:       66.0 in Accession #:    [id number]     Weight:       173.5 lb Date of Birth:  Nov 16, 1940      BSA:          1.88 m Patient Age:    1 years       BP:           121/74 mmHg Patient Gender: M              HR:           74 bpm. Exam Location:  [name] Procedure: 2D Echo Indications:    Congestive Heart Failure 428.0 / I50.9  History:        Patient has prior history of Echocardiogram examinations, most                 recent 05/28/2016. CHF, COPD, Signs/Symptoms:Chest Pain and                 Fever; Risk Factors:Hypertension, Diabetes and Former Smoker.  Chronic renal insufficiency.  Sonographer:    [name] RDCS (AE) Referring Phys: [place]  1. Left ventricular ejection fraction, by visual estimation, is 25 to 30%. The left ventricle has severely decreased function. There is no left ventricular hypertrophy.  2. Elevated left ventricular end-diastolic pressure.  3. Left ventricular diastolic parameters are consistent with Grade II diastolic dysfunction (pseudonormalization).  4. Mild to moderately dilated left ventricular internal cavity size.  5. The left ventricle demonstrates global hypokinesis.  6. Global right ventricle has normal systolic function.The right ventricular size is normal. Mildly increased right ventricular wall thickness.  7. Left atrial size was severely dilated.  8. Right atrial size was moderately dilated.  9. The mitral valve is grossly  normal. Mild mitral valve regurgitation. 10. The tricuspid valve is grossly normal. 11. The tricuspid valve is grossly normal. Tricuspid valve regurgitation is mild. 12. The aortic valve is tricuspid. Aortic valve regurgitation is not visualized. No evidence of aortic valve sclerosis or stenosis. 13. The pulmonic valve was grossly normal. Pulmonic valve regurgitation is not visualized. 14. The inferior vena cava is normal in size with greater than 50% respiratory variability, suggesting right atrial pressure of 3 mmHg. FINDINGS  Left Ventricle: Left ventricular ejection fraction, by visual estimation, is 25 to 30%. The left ventricle has severely decreased function. The left ventricle demonstrates global hypokinesis. The left ventricular internal cavity size was mildly to moderately dilated left ventricle. There is no left ventricular hypertrophy. Left ventricular diastolic parameters are consistent with Grade II diastolic dysfunction (pseudonormalization). Elevated left ventricular end-diastolic pressure. Right Ventricle: The right ventricular size is normal. Mildly increased right ventricular wall thickness. Global RV systolic function is has normal systolic function. Left Atrium: Left atrial size was severely dilated. Right Atrium: Right atrial size was moderately dilated Pericardium: There is no evidence of pericardial effusion. Mitral Valve: The mitral valve is grossly normal. Mild mitral valve regurgitation. Tricuspid Valve: The tricuspid valve is grossly normal. Tricuspid valve regurgitation is mild. Aortic Valve: The aortic valve is tricuspid. Aortic valve regurgitation is not visualized. The aortic valve is structurally normal, with no evidence of sclerosis or stenosis. Pulmonic Valve: The pulmonic valve was grossly normal. Pulmonic valve regurgitation is not visualized. Pulmonic regurgitation is not visualized. Aorta: The aortic root is normal in size and structure. Venous: The inferior vena cava is normal  in size with greater than 50% respiratory variability, suggesting right atrial pressure of 3 mmHg. IAS/Shunts: No atrial level shunt detected by color flow Doppler.  LEFT VENTRICLE PLAX 2D LVIDd:         6.37 cm  Diastology LVIDs:         5.33 cm  LV e' lateral:   11.00 cm/s LV PW:         1.08 cm  LV E/e' lateral: 6.7 LV IVS:        0.89 cm  LV e' medial:    4.13 cm/s LVOT diam:     2.30 cm  LV E/e' medial:  17.7 LV SV:         69 ml LV SV Index:   35.78 LVOT Area:     4.15 cm  RIGHT VENTRICLE RV S prime:     19.40 cm/s TAPSE (M-mode): 3.1 cm LEFT ATRIUM             Index       RIGHT ATRIUM           Index LA diam:  4.80 cm 2.55 cm/m  RA Area:     21.60 cm LA Vol (A2C):   89.8 ml 47.70 ml/m RA Volume:   71.90 ml  38.19 ml/m LA Vol (A4C):   79.5 ml 42.23 ml/m LA Biplane Vol: 87.2 ml 46.32 ml/m   AORTA Ao Root diam: 3.10 cm MITRAL VALVE MV Area (PHT): 5.13 cm             SHUNTS MV PHT:        42.92 msec           Systemic Diam: 2.30 cm MV Decel Time: 148 msec MV E velocity: 73.20 cm/s 103 cm/s MV A velocity: 37.30 cm/s 70.3 cm/s MV E/A ratio:  1.96       1.5  Kate Sable MD Electronically signed by Kate Sable MD Signature Date/Time: 08/31/2019/4:26:13 PM    Final    Scheduled Meds: . insulin aspart  0-5 Units Subcutaneous QHS  . insulin aspart  0-9 Units Subcutaneous TID WC  . [START ON 09/02/2019] losartan  25 mg Oral Daily  . metoprolol tartrate  12.5 mg Oral BID  . montelukast  10 mg Oral QHS  . sodium chloride flush  3 mL Intravenous Q12H  . tamsulosin  0.4 mg Oral QPC supper   Continuous Infusions: . sodium chloride    . heparin       LOS: 1 day    Time spent: 35 minutes. Greater than 50% of this time was spent in direct contact with the patient, coordinating care and discussing relevant ongoing clinical issues, including chest pain, acute on chronic combined systolic/diastolic heart failure and at this moment with new decrease in his ejection fraction the need for  further work-up including cardiac catheterization.  Case have been discussed with cardiology service (Dr. Bronson Ing) recommendations given to transfer patient to [facility] for further being evaluated by cardiology service there and decide best timing for cath.     Barton Dubois, MD [facility] Pager [phone]  If 7PM-7AM, please contact night-coverage [email or web address] Password [email or web address] 09/01/2019, 2:12 PM

## 2019-09-01 NOTE — Progress Notes (Signed)
Patient to be transferred to [facility]. Report called and given to [facility] RN. All questions were answered and no further questions at this time. Patient in stable condition and in no acute distress at time of discharge. Patient will be transported via [place]. Patient and family updated by MD and made aware of transfer to [facility].

## 2019-09-01 NOTE — Progress Notes (Signed)
EMS arrived at 1925 to transport patient to [facility] [phone].  Report called by prior shift and given to Kizzie Fantasia, RN.   Patient assisted to stretcher, all belongs transferred to EMS. Patient in stable condition with no acute distress at time of transfer. Telemetry called and discontinued on our end.

## 2019-09-01 NOTE — Progress Notes (Signed)
Patient complained of 10/10 chest pain. EKG and new vitals were obtained, but during that time pain resolved spontaneously. Provider notified.

## 2019-09-01 NOTE — Progress Notes (Signed)
Patient complains of chest pain 10/10 that started after he completed incentive spirometer. Patient states he feels an aching pain at mid sternum. Patient denies any nausea or vomiting or any other signs of distress.  Vital signs are as follows    09/01/19 1146  Vitals  Temp (!) 97.3 F (36.3 C)  Temp Source Oral  BP 92/60  BP Location Right Arm  BP Method Automatic  Patient Position (if appropriate) Sitting  Pulse Rate 85  Pulse Rate Source Dinamap  Resp 18  Oxygen Therapy  SpO2 100 %  O2 Device Nasal Cannula  O2 Flow Rate (L/min) 2.5 L/min  Patient Activity (if Appropriate) In chair  Pain Assessment  Pain Scale 0-10  Pain Score 10  Pain Type Acute pain  Pain Location Chest  Pain Orientation Mid  Pain Descriptors / Indicators Aching;Discomfort  Pain Frequency Intermittent  Pain Onset Gradual  Multiple Pain Sites No  MEWS Score  MEWS Temp 0  MEWS Systolic 1  MEWS Pulse 0  MEWS RR 0  MEWS LOC 0  MEWS Score 1  MEWS Score Color Green   MD has been informed and received orders for chest XRay and EKG. Will continue to monitor patient.

## 2019-09-01 NOTE — Progress Notes (Signed)
Patient transferred prior to shift assessment being completed.

## 2019-09-02 DIAGNOSIS — R079 Chest pain, unspecified: Secondary | ICD-10-CM

## 2019-09-02 DIAGNOSIS — I5043 Acute on chronic combined systolic (congestive) and diastolic (congestive) heart failure: Secondary | ICD-10-CM

## 2019-09-02 DIAGNOSIS — Z86711 Personal history of pulmonary embolism: Secondary | ICD-10-CM

## 2019-09-02 DIAGNOSIS — I10 Essential (primary) hypertension: Secondary | ICD-10-CM

## 2019-09-02 DIAGNOSIS — N189 Chronic kidney disease, unspecified: Secondary | ICD-10-CM

## 2019-09-02 DIAGNOSIS — J449 Chronic obstructive pulmonary disease, unspecified: Secondary | ICD-10-CM

## 2019-09-02 DIAGNOSIS — I519 Heart disease, unspecified: Secondary | ICD-10-CM

## 2019-09-02 DIAGNOSIS — I248 Other forms of acute ischemic heart disease: Secondary | ICD-10-CM

## 2019-09-02 LAB — BASIC METABOLIC PANEL
Anion gap: 9 (ref 5–15)
BUN: 19 mg/dL (ref 8–23)
CO2: 29 mmol/L (ref 22–32)
Calcium: 8.8 mg/dL — ABNORMAL LOW (ref 8.9–10.3)
Chloride: 101 mmol/L (ref 98–111)
Creatinine, Ser: 1.65 mg/dL — ABNORMAL HIGH (ref 0.61–1.24)
GFR calc Af Amer: 45 mL/min — ABNORMAL LOW (ref >60)
GFR calc non Af Amer: 39 mL/min — ABNORMAL LOW (ref >60)
Glucose, Bld: 120 mg/dL — ABNORMAL HIGH (ref 70–99)
Potassium: 4.1 mmol/L (ref 3.5–5.1)
Sodium: 139 mmol/L (ref 135–145)

## 2019-09-02 LAB — CBC
HCT: 37.7 % — ABNORMAL LOW (ref 39.0–52.0)
Hemoglobin: 11.4 g/dL — ABNORMAL LOW (ref 13.0–17.0)
MCH: 26.7 pg (ref 26.0–34.0)
MCHC: 30.2 g/dL (ref 30.0–36.0)
MCV: 88.3 fL (ref 80.0–100.0)
Platelets: 278 10*3/uL (ref 150–400)
RBC: 4.27 MIL/uL (ref 4.22–5.81)
RDW: 13.6 % (ref 11.5–15.5)
WBC: 5.4 10*3/uL (ref 4.0–10.5)
nRBC: 0 % (ref 0.0–0.2)

## 2019-09-02 LAB — GLUCOSE, CAPILLARY
Glucose-Capillary: 91 mg/dL (ref 70–99)
Glucose-Capillary: 80 mg/dL (ref 70–99)
Glucose-Capillary: 81 mg/dL (ref 70–99)
Glucose-Capillary: 113 mg/dL — ABNORMAL HIGH (ref 70–99)

## 2019-09-02 LAB — APTT
aPTT: 36 seconds (ref 24–36)
aPTT: 36 seconds (ref 24–36)

## 2019-09-02 LAB — HEPARIN LEVEL (UNFRACTIONATED): Heparin Unfractionated: 0.52 IU/mL (ref 0.30–0.70)

## 2019-09-02 MED ORDER — SODIUM CHLORIDE 0.9% FLUSH: 3.0000 mL | INTRAVENOUS | Status: DC | PRN

## 2019-09-02 MED ORDER — ASPIRIN 81 MG PO CHEW
81.0000 mg | CHEWABLE_TABLET | ORAL | Status: AC
  Administered 2019-09-03: 81 mg via ORAL
  Filled 2019-09-02: qty 1

## 2019-09-02 MED ORDER — HEPARIN BOLUS VIA INFUSION
2000.0000 [IU] | Freq: Once | INTRAVENOUS | Status: AC
  Administered 2019-09-02: 18:00:00 2000 [IU] via INTRAVENOUS
  Filled 2019-09-02: qty 2000

## 2019-09-02 MED ORDER — HEPARIN BOLUS VIA INFUSION
1000.0000 [IU] | Freq: Once | INTRAVENOUS | Status: AC
  Administered 2019-09-02: 11:00:00 1000 [IU] via INTRAVENOUS
  Filled 2019-09-02: qty 1000

## 2019-09-02 MED ORDER — ALUM & MAG HYDROXIDE-SIMETH 200-200-20 MG/5ML PO SUSP
30.0000 mL | Freq: Four times a day (QID) | ORAL | Status: DC | PRN
  Administered 2019-09-02: 17:00:00 30 mL via ORAL
  Filled 2019-09-02 (×2): qty 30

## 2019-09-02 MED ORDER — SODIUM CHLORIDE 0.9 % IV SOLN: 250.0000 mL | INTRAVENOUS | Status: DC | PRN

## 2019-09-02 MED ORDER — SODIUM CHLORIDE 0.9 % IV SOLN: INTRAVENOUS | Status: DC

## 2019-09-02 MED ORDER — SODIUM CHLORIDE 0.9% FLUSH: 3.0000 mL | Freq: Two times a day (BID) | INTRAVENOUS | Status: DC

## 2019-09-02 NOTE — H&P (View-Only) (Signed)
Cardiology Consultation:   Patient ID: Gregory Mitchell MRN: [id number]; DOB: 1941-06-24  Admit date: 08/31/2019 Date of Consult: 09/02/2019  Primary Care Provider: Neale Burly, MD Primary Cardiologist: Carlyle Dolly, MD  Primary Electrophysiologist:  None    Patient Profile:   Gregory Mitchell is a 79 y.o. male with a hx of longstanding chest pain with nonobstructive coronary artery disease by cardiac catheterization in 0000000, chronic systolic heart failure with evidence of improved LV function in the past (secondary to nonischemic cardiomyopathy), COPD, hypertension, hyperlipidemia, prior pulmonary embolism, chronic kidney disease and diabetes who is being seen today for the evaluation of decompensated heart failure in the setting of reduced LV function and elevated troponin at the request of Dr. Dyann Kief.  History of Present Illness:   Gregory Mitchell previously had an ejection fraction as low as 35-40%.  Cardiac catheterization 2013 demonstrated nonobstructive coronary disease (mid RCA 25%) signifying a nonischemic cardiomyopathy.  His EF subsequently improved to 55-60% in 2017.  He was last seen by Dr. Harl Bowie in the [place] clinic in September 2020.  He had been admitted to [facility] in August 2020 with pneumonia and pulmonary emboli.  He was placed on Apixaban for anticoagulation as his pulmonary emboli seemed to be unprovoked.  He presented to [facility] on 08/31/2019 with complaints of shortness of breath and chest discomfort.  BNP was elevated and chest x-ray was consistent with pulmonary edema.  He has been diuresed with IV furosemide.  High-sensitivity troponin is mildly elevated without significant delta.  Echocardiogram demonstrated reduced LV function with an EF of 25-30%.  Therefore, he was transferred most hospital for further evaluation management.  Mr. Loeber notes that he has been short of breath and having chest pain for 1-2 weeks.  He went to [facility] ED about a week  ago and was sent home with oral diuretics.  He continued to have shortness of breath and presented to [facility] for evaluation.  He has been having shortness of breath with minimal activity with assoc chest pain.  He has not had any radiating symptoms or assoc nausea/diaphoresis.  He notes 2 pillow orthopnea but no paroxysmal nocturnal dyspnea.  He has not had syncope, fever.   Data: EKG 09/01/2019: No acute changes, normal sinus rhythm   Echo 08/31/2019: EF 25-30, Gr 2 DD, BAE, mild MR  K 3.8, SCr 1.22>>1.36, ALT 14 BNP 778; hsTrop 39 >> 40 Hgb 11.4, MCV 88.3 A1c 6.1; TSH 1.017 SARS-CoV-2 neg  CXR #1 - interstitial edema vs pneumonitis CXR # 2 - LLL infiltrate c/w pna  Past Medical History:  Diagnosis Date  . Asthma   . Chronic renal insufficiency   . COPD (chronic obstructive pulmonary disease) ([place])   . Hypertension   . Renal insufficiency   . Type 2 diabetes mellitus ([place])     Past Surgical History:  Procedure Laterality Date  . APPENDECTOMY    . CHOLECYSTECTOMY    . NASAL SINUS SURGERY    . NEPHRECTOMY     RIGHT (Question CA.  No further therapy needed.)     Home Medications:  Prior to Admission medications   Medication Sig Start Date End Date Taking? Authorizing Provider  albuterol (PROAIR HFA) 108 (90 BASE) MCG/ACT inhaler Inhale 2 puffs into the lungs every 6 (six) hours as needed for wheezing or shortness of breath.    Yes [provider]  apixaban (ELIQUIS) 5 MG TABS tablet Take 5 mg by mouth 2 (two) times daily.   Yes  [provider]  ipratropium-albuterol (DUONEB) 0.5-2.5 (3) MG/3ML SOLN Take 3 mLs by nebulization every 6 (six) hours as needed (for shortness of breath/wheezing).    Yes [provider]  losartan (COZAAR) 50 MG tablet TAKE 1 TABLET BY MOUTH TWICE DAILY Patient taking differently: Take 50 mg by mouth daily.  12/30/16  Yes BranchAlphonse Guild, MD  metFORMIN (GLUCOPHAGE-XR) 500 MG 24 hr tablet Take 1,000 mg by mouth 2 (two) times  daily.   Yes [provider]  methylPREDNISolone (MEDROL DOSEPAK) 4 MG TBPK tablet Take 4-24 mg by mouth See admin instructions. Take as directed per package instructions for 6 days starting on 08/24/2019 08/24/19  Yes [provider]  Omega-3 Fatty Acids (FISH OIL) 1000 MG CAPS Take 1 capsule by mouth 2 (two) times daily.    Yes [provider]  OXYGEN Inhale 2 L into the lungs at bedtime as needed (for shortness of breath).   Yes [provider]  tamsulosin (FLOMAX) 0.4 MG CAPS capsule Take 0.4 mg by mouth daily.  11/02/17  Yes [provider]    Inpatient Medications: Scheduled Meds: . heparin  1,000 Units Intravenous Once  . insulin aspart  0-5 Units Subcutaneous QHS  . insulin aspart  0-9 Units Subcutaneous TID WC  . losartan  25 mg Oral Daily  . metoprolol tartrate  12.5 mg Oral BID  . montelukast  10 mg Oral QHS  . sodium chloride flush  3 mL Intravenous Q12H  . tamsulosin  0.4 mg Oral QPC supper   Continuous Infusions: . sodium chloride    . heparin 800 Units/hr (09/01/19 2152)   PRN Meds: sodium chloride, acetaminophen, ipratropium-albuterol, ondansetron (ZOFRAN) IV, sodium chloride flush  Allergies:    Allergies  Allergen Reactions  . Amoxicillin     Makes eyes turn yellow and burn  . Ciprofloxacin     Pt not sure    Social History:   Social History   Socioeconomic History  . Marital status: Married    Spouse name: Not on file  . Number of children: Not on file  . Years of education: Not on file  . Highest education level: Not on file  Occupational History  . Not on file  Tobacco Use  . Smoking status: Former Smoker    Packs/day: 1.00    Years: 15.00    Pack years: 15.00    Types: Cigarettes    Start date: 07/14/1957    Quit date: 08/09/1976    Years since quitting: 43.0  . Smokeless tobacco: Former Systems developer    Types: Chew    Quit date: 02/10/2006  . Tobacco comment: chewed tobacco for 10 years  Substance and Sexual  Activity  . Alcohol use: Not on file  . Drug use: Not on file  . Sexual activity: Not on file  Other Topics Concern  . Not on file  Social History Narrative   Lives with wife.  Three children.     Social Determinants of Health   Financial Resource Strain:   . Difficulty of Paying Living Expenses: Not on file  Food Insecurity:   . Worried About Charity fundraiser in the Last Year: Not on file  . Ran Out of Food in the Last Year: Not on file  Transportation Needs:   . Lack of Transportation (Medical): Not on file  . Lack of Transportation (Non-Medical): Not on file  Physical Activity:   . Days of Exercise per Week: Not on file  .  Minutes of Exercise per Session: Not on file  Stress:   . Feeling of Stress : Not on file  Social Connections:   . Frequency of Communication with Friends and Family: Not on file  . Frequency of Social Gatherings with Friends and Family: Not on file  . Attends Religious Services: Not on file  . Active Member of Clubs or Organizations: Not on file  . Attends Archivist Meetings: Not on file  . Marital Status: Not on file  Intimate Partner Violence:   . Fear of Current or Ex-Partner: Not on file  . Emotionally Abused: Not on file  . Physically Abused: Not on file  . Sexually Abused: Not on file    Family History:    Family History  Problem Relation Age of Onset  . Lung cancer Brother   . Stroke Mother   . Breast cancer Mother      ROS:  Please see the history of present illness.  He has a chronic cough from COPD. It is currently improved. He has not had melena, hematochezia, hematuria, vomiting, diarrhea.  All other ROS reviewed and negative.     Physical Exam/Data:   Vitals:   09/01/19 2221 09/02/19 0045 09/02/19 0546 09/02/19 0811  BP: (!) 98/54 (!) 89/52 (!) 90/55 (!) 88/63  Pulse: 76 73 79 72  Resp:  20 16 20   Temp:  98.3 F (36.8 C) 98.2 F (36.8 C) (!) 97.5 F (36.4 C)  TempSrc:   Oral Oral  SpO2: 95% 98% 99% 96%    Weight:   66.9 kg   Height:        Intake/Output Summary (Last 24 hours) at 09/02/2019 0919 Last data filed at 09/02/2019 0900 Gross per 24 hour  Intake 774.78 ml  Output 1250 ml  Net -475.22 ml   Last 3 Weights 09/02/2019 09/01/2019 09/01/2019  Weight (lbs) 147 lb 7.8 oz 145 lb 11.2 oz 146 lb 12.5 oz  Weight (kg) 66.9 kg 66.089 kg 66.58 kg     Body mass index is 23.81 kg/m.  General:  Well nourished, well developed, in no acute distress  HEENT: normal Lymph: no adenopathy Neck: JVP 6 cm  Endocrine:  No thryomegaly Cardiac:  Distant heart sounds, RRR, no obvious murmur Lungs:  Decreased breath sounds bilat; L basilar crackles  Abd: soft, nontender, no hepatomegaly  Ext: no edema Musculoskeletal:  No deformities Skin: warm and dry  Neuro:  CNs 2-12 intact, no focal abnormalities noted Psych:  Normal affect   EKG:  The EKG was personally reviewed and demonstrates:  Sinus rhythm, first-degree AV block, left axis deviation, right bundle branch block, T wave inversions V4-V6, QTC 556; similar to prior tracings  Telemetry:  Telemetry was personally reviewed and demonstrates:  Normal sinus rhythm   Relevant CV Studies: Echocardiogram 08/30/2019  1. Left ventricular ejection fraction, by visual estimation, is 25 to 30%. The left ventricle has severely decreased function. There is no left ventricular hypertrophy.  2. Elevated left ventricular end-diastolic pressure.  3. Left ventricular diastolic parameters are consistent with Grade II diastolic dysfunction (pseudonormalization).  4. Mild to moderately dilated left ventricular internal cavity size.  5. The left ventricle demonstrates global hypokinesis.  6. Global right ventricle has normal systolic function.The right ventricular size is normal. Mildly increased right ventricular wall thickness.  7. Left atrial size was severely dilated.  8. Right atrial size was moderately dilated.  9. The mitral valve is grossly normal. Mild mitral  valve regurgitation. 10.  The tricuspid valve is grossly normal. 11. The tricuspid valve is grossly normal. Tricuspid valve regurgitation is mild. 12. The aortic valve is tricuspid. Aortic valve regurgitation is not visualized. No evidence of aortic valve sclerosis or stenosis. 13. The pulmonic valve was grossly normal. Pulmonic valve regurgitation is not visualized. 14. The inferior vena cava is normal in size with greater than 50% respiratory variability, suggesting right atrial pressure of 3 mmHg.   Laboratory Data:  High Sensitivity Troponin:   Recent Labs  Lab 08/31/19 1220 08/31/19 1414  TROPONINIHS 39* 40*     Chemistry Recent Labs  Lab 08/31/19 1220 09/01/19 0610  NA 139 137  K 4.0 3.8  CL 103 99  CO2 27 29  GLUCOSE 80 88  BUN 10 12  CREATININE 1.22 1.36*  CALCIUM 8.7* 8.8*  GFRNONAA 56* 49*  GFRAA >60 57*  ANIONGAP 9 9    Recent Labs  Lab 08/31/19 1220  PROT 6.7  ALBUMIN 3.4*  AST 22  ALT 14  ALKPHOS 34*  BILITOT 0.7   Hematology Recent Labs  Lab 08/31/19 1220 09/02/19 0717  WBC 3.9* 5.4  RBC 4.12* 4.27  HGB 11.0* 11.4*  HCT 37.6* 37.7*  MCV 91.3 88.3  MCH 26.7 26.7  MCHC 29.3* 30.2  RDW 13.7 13.6  PLT 272 278   BNP Recent Labs  Lab 08/31/19 1220  BNP 778.0*    DDimer No results for input(s): DDIMER in the last 168 hours.   Radiology/Studies:  DG Chest 2 View  Result Date: 09/01/2019 CLINICAL DATA:  Shortness of breath and chest pain for several days, history asthma, hypertension, diabetes mellitus, negative for COVID-19 on 01/22 EXAM: CHEST - 2 VIEW COMPARISON:  08/31/2019 FINDINGS: Minimal enlargement of cardiac silhouette. Mediastinal contours and pulmonary vascularity normal. LEFT lower lobe consolidation consistent with pneumonia. Remaining lungs minimally hyperinflated but clear. No pleural effusion or pneumothorax. IMPRESSION: Minimal enlargement of cardiac silhouette. LEFT lower lobe infiltrate consistent with pneumonia.  Electronically Signed   By: Lavonia Dana M.D.   On: 09/01/2019 13:49   DG Chest Portable 1 View  Result Date: 08/31/2019 CLINICAL DATA:  Shortness of breath.  Left chest pressure. EXAM: PORTABLE CHEST 1 VIEW COMPARISON:  10/21/2016. FINDINGS: Cardiomegaly mild pulmonary venous congestion. Diffuse bilateral interstitial prominence. Interstitial edema and/or pneumonitis could present this fashion. Tiny bilateral pleural effusions. No pneumothorax. IMPRESSION: 1.  Cardiomegaly with pulmonary venous congestion. 2. Diffuse bilateral interstitial prominence interstitial edema and/or pneumonitis could present this fashion. Tiny bilateral pleural effusions. Electronically Signed   By: Marcello Moores  Register   On: 08/31/2019 12:39   ECHOCARDIOGRAM COMPLETE  Result Date: 08/31/2019   ECHOCARDIOGRAM REPORT   Patient Name:   LYAL SCULL Date of Exam: 08/31/2019 Medical Rec #:  [id number]      Height:       66.0 in Accession #:    [id number]     Weight:       173.5 lb Date of Birth:  [birth year]      BSA:          1.88 m Patient Age:    33 years       BP:           121/74 mmHg Patient Gender: M              HR:           74 bpm. Exam Location:  [name] Procedure: 2D Echo Indications:    Congestive Heart Failure 428.0 /  I50.9  History:        Patient has prior history of Echocardiogram examinations, most                 recent 05/28/2016. CHF, COPD, Signs/Symptoms:Chest Pain and                 Fever; Risk Factors:Hypertension, Diabetes and Former Smoker.                 Chronic renal insufficiency.  Sonographer:    [name] RDCS (AE) Referring Phys: [place]  1. Left ventricular ejection fraction, by visual estimation, is 25 to 30%. The left ventricle has severely decreased function. There is no left ventricular hypertrophy.  2. Elevated left ventricular end-diastolic pressure.  3. Left ventricular diastolic parameters are consistent with Grade II diastolic dysfunction (pseudonormalization).  4.  Mild to moderately dilated left ventricular internal cavity size.  5. The left ventricle demonstrates global hypokinesis.  6. Global right ventricle has normal systolic function.The right ventricular size is normal. Mildly increased right ventricular wall thickness.  7. Left atrial size was severely dilated.  8. Right atrial size was moderately dilated.  9. The mitral valve is grossly normal. Mild mitral valve regurgitation. 10. The tricuspid valve is grossly normal. 11. The tricuspid valve is grossly normal. Tricuspid valve regurgitation is mild. 12. The aortic valve is tricuspid. Aortic valve regurgitation is not visualized. No evidence of aortic valve sclerosis or stenosis. 13. The pulmonic valve was grossly normal. Pulmonic valve regurgitation is not visualized. 14. The inferior vena cava is normal in size with greater than 50% respiratory variability, suggesting right atrial pressure of 3 mmHg. FINDINGS  Left Ventricle: Left ventricular ejection fraction, by visual estimation, is 25 to 30%. The left ventricle has severely decreased function. The left ventricle demonstrates global hypokinesis. The left ventricular internal cavity size was mildly to moderately dilated left ventricle. There is no left ventricular hypertrophy. Left ventricular diastolic parameters are consistent with Grade II diastolic dysfunction (pseudonormalization). Elevated left ventricular end-diastolic pressure. Right Ventricle: The right ventricular size is normal. Mildly increased right ventricular wall thickness. Global RV systolic function is has normal systolic function. Left Atrium: Left atrial size was severely dilated. Right Atrium: Right atrial size was moderately dilated Pericardium: There is no evidence of pericardial effusion. Mitral Valve: The mitral valve is grossly normal. Mild mitral valve regurgitation. Tricuspid Valve: The tricuspid valve is grossly normal. Tricuspid valve regurgitation is mild. Aortic Valve: The aortic  valve is tricuspid. Aortic valve regurgitation is not visualized. The aortic valve is structurally normal, with no evidence of sclerosis or stenosis. Pulmonic Valve: The pulmonic valve was grossly normal. Pulmonic valve regurgitation is not visualized. Pulmonic regurgitation is not visualized. Aorta: The aortic root is normal in size and structure. Venous: The inferior vena cava is normal in size with greater than 50% respiratory variability, suggesting right atrial pressure of 3 mmHg. IAS/Shunts: No atrial level shunt detected by color flow Doppler.  LEFT VENTRICLE PLAX 2D LVIDd:         6.37 cm  Diastology LVIDs:         5.33 cm  LV e' lateral:   11.00 cm/s LV PW:         1.08 cm  LV E/e' lateral: 6.7 LV IVS:        0.89 cm  LV e' medial:    4.13 cm/s LVOT diam:     2.30 cm  LV E/e' medial:  17.7 LV  SV:         69 ml LV SV Index:   35.78 LVOT Area:     4.15 cm  RIGHT VENTRICLE RV S prime:     19.40 cm/s TAPSE (M-mode): 3.1 cm LEFT ATRIUM             Index       RIGHT ATRIUM           Index LA diam:        4.80 cm 2.55 cm/m  RA Area:     21.60 cm LA Vol (A2C):   89.8 ml 47.70 ml/m RA Volume:   71.90 ml  38.19 ml/m LA Vol (A4C):   79.5 ml 42.23 ml/m LA Biplane Vol: 87.2 ml 46.32 ml/m   AORTA Ao Root diam: 3.10 cm MITRAL VALVE MV Area (PHT): 5.13 cm             SHUNTS MV PHT:        42.92 msec           Systemic Diam: 2.30 cm MV Decel Time: 148 msec MV E velocity: 73.20 cm/s 103 cm/s MV A velocity: 37.30 cm/s 70.3 cm/s MV E/A ratio:  1.96       1.5  Kate Sable MD Electronically signed by Kate Sable MD Signature Date/Time: 08/31/2019/4:26:13 PM    Final          Assessment and Plan:   1.  Acute on chronic combined systolic and diastolic congestive heart failure Mr. Okita has hx of non-ischemic cardiomyopathy with EF as low as 35-40% in the past.  It had recovered to normal EF in 2017.  He was admitted to [facility] in 03/2019 with pneumonia and pulmonary emboli.  He had seemingly done  well after that admission until the last couple of weeks.  He is now admitted with decompensated heart failure with reduced LVF with EF 25-30% on echocardiogram.  His elevated Troponin is likely related to demand ischemia.  He has had significant chest pain as well.  He last had a heart cath in 2013 with minimal plaque.  Given his significant risk factors for coronary artery disease and reduced LVF, we will proceed with R&L cardiac catheterization to better define his cardiomyopathy.  Risks and benefits of cardiac catheterization have been discussed with the patient.  These include bleeding, infection, kidney damage, stroke, heart attack, death.  The patient understands these risks and is willing to proceed.   -Plan R&L heart cath tomorrow  -As BP will allow consider changing Metoprolol to Carvedilol, Losartan to Entresto; +/- Spironolactone  -Start daily diuretic after cath tomorrow   2.  Elevated troponin Likely demand ischemia.  There is no significant delta.  Plan R&L heart cath as noted.  3.  Chronic kidney disease Creatinine fairly stable.  He does have a solitary kidney.  His BP has been running low this AM. Will hold his Losartan for now.  Losartan will need to be resumed post cath for management of CHF.    4.  COPD Management per primary service.    5.  History of pulmonary embolism He will need to remain on long term Apixaban.    6.  Hypertension As noted, his BP is running low. Hold Losartan for now.       For questions or updates, please contact [place] Please consult [email or web address] for contact info under   Signed, Richardson Dopp, PA-C  09/02/2019 9:19 AM   The patient was seen and examined,  and I agree with the history, physical exam, assessment and plan as documented above, with modifications as noted below. I have also personally reviewed all relevant documentation, old records, labs, and both radiographic and cardiovascular studies. I have also independently  interpreted old and new ECG's.  Briefly, 79 yo male with chronic chest pains, NICM, and nonobstructive CAD by cath in 2013. Also has h/o unprovoked PE for which he takes apixaban. He presented to [facility] with worsening DOE and found to have acute systolic heart failure. He had chest pains when he was hypervolemic. After diuresis and becoming euvolemic, he continued to complain of exertional chest pains. He has had them for 1-2 weeks prior to admission. Echo showed worsened LV systolic function, EF [id number], which had normalized by echo in 2017. Dr. Dyann Kief called me yesterday and it was decided to transfer him to [facility] for right and left heart cath/coronary angiography on 1/25. Currently on IV heparin. Hypotensive but asymptomatic at rest with respect to this. Will dc losartan. Beta blocker ordered with hold parameters. Once BP improves and prior to discharge, would transition Lopressor to Coreg or Toprol-XL. Consider switching losartan to [facility] as well as BP permits.   Kate Sable, MD, [facility]  09/02/2019 9:24 AM

## 2019-09-02 NOTE — Consult Note (Addendum)
Cardiology Consultation:   Patient ID: Gregory Mitchell MRN: [id number]; DOB: 09-Mar-1941  Admit date: 08/31/2019 Date of Consult: 09/02/2019  Primary Care Provider: Neale Burly, MD Primary Cardiologist: Carlyle Dolly, MD  Primary Electrophysiologist:  None    Patient Profile:   Gregory Mitchell is a 79 y.o. male with a hx of longstanding chest pain with nonobstructive coronary artery disease by cardiac catheterization in 0000000, chronic systolic heart failure with evidence of improved LV function in the past (secondary to nonischemic cardiomyopathy), COPD, hypertension, hyperlipidemia, prior pulmonary embolism, chronic kidney disease and diabetes who is being seen today for the evaluation of decompensated heart failure in the setting of reduced LV function and elevated troponin at the request of Dr. Dyann Kief.  History of Present Illness:   Gregory Mitchell previously had an ejection fraction as low as 35-40%.  Cardiac catheterization 2013 demonstrated nonobstructive coronary disease (mid RCA 25%) signifying a nonischemic cardiomyopathy.  His EF subsequently improved to 55-60% in 2017.  He was last seen by Dr. Harl Bowie in the [place] clinic in September 2020.  He had been admitted to [facility] in August 2020 with pneumonia and pulmonary emboli.  He was placed on Apixaban for anticoagulation as his pulmonary emboli seemed to be unprovoked.  He presented to [facility] on 08/31/2019 with complaints of shortness of breath and chest discomfort.  BNP was elevated and chest x-ray was consistent with pulmonary edema.  He has been diuresed with IV furosemide.  High-sensitivity troponin is mildly elevated without significant delta.  Echocardiogram demonstrated reduced LV function with an EF of 25-30%.  Therefore, he was transferred most hospital for further evaluation management.  Gregory Mitchell notes that he has been short of breath and having chest pain for 1-2 weeks.  He went to [facility] ED about a week  ago and was sent home with oral diuretics.  He continued to have shortness of breath and presented to [facility] for evaluation.  He has been having shortness of breath with minimal activity with assoc chest pain.  He has not had any radiating symptoms or assoc nausea/diaphoresis.  He notes 2 pillow orthopnea but no paroxysmal nocturnal dyspnea.  He has not had syncope, fever.   Data: EKG 09/01/2019: No acute changes, normal sinus rhythm   Echo 08/31/2019: EF 25-30, Gr 2 DD, BAE, mild MR  K 3.8, SCr 1.22>>1.36, ALT 14 BNP 778; hsTrop 39 >> 40 Hgb 11.4, MCV 88.3 A1c 6.1; TSH 1.017 SARS-CoV-2 neg  CXR #1 - interstitial edema vs pneumonitis CXR # 2 - LLL infiltrate c/w pna  Past Medical History:  Diagnosis Date  . Asthma   . Chronic renal insufficiency   . COPD (chronic obstructive pulmonary disease) ([place])   . Hypertension   . Renal insufficiency   . Type 2 diabetes mellitus ([place])     Past Surgical History:  Procedure Laterality Date  . APPENDECTOMY    . CHOLECYSTECTOMY    . NASAL SINUS SURGERY    . NEPHRECTOMY     RIGHT (Question CA.  No further therapy needed.)     Home Medications:  Prior to Admission medications   Medication Sig Start Date End Date Taking? Authorizing Provider  albuterol (PROAIR HFA) 108 (90 BASE) MCG/ACT inhaler Inhale 2 puffs into the lungs every 6 (six) hours as needed for wheezing or shortness of breath.    Yes [provider]  apixaban (ELIQUIS) 5 MG TABS tablet Take 5 mg by mouth 2 (two) times daily.   Yes  [provider]  ipratropium-albuterol (DUONEB) 0.5-2.5 (3) MG/3ML SOLN Take 3 mLs by nebulization every 6 (six) hours as needed (for shortness of breath/wheezing).    Yes [provider]  losartan (COZAAR) 50 MG tablet TAKE 1 TABLET BY MOUTH TWICE DAILY Patient taking differently: Take 50 mg by mouth daily.  12/30/16  Yes BranchAlphonse Guild, MD  metFORMIN (GLUCOPHAGE-XR) 500 MG 24 hr tablet Take 1,000 mg by mouth 2 (two) times  daily.   Yes [provider]  methylPREDNISolone (MEDROL DOSEPAK) 4 MG TBPK tablet Take 4-24 mg by mouth See admin instructions. Take as directed per package instructions for 6 days starting on 08/24/2019 08/24/19  Yes [provider]  Omega-3 Fatty Acids (FISH OIL) 1000 MG CAPS Take 1 capsule by mouth 2 (two) times daily.    Yes [provider]  OXYGEN Inhale 2 L into the lungs at bedtime as needed (for shortness of breath).   Yes [provider]  tamsulosin (FLOMAX) 0.4 MG CAPS capsule Take 0.4 mg by mouth daily.  11/02/17  Yes [provider]    Inpatient Medications: Scheduled Meds: . heparin  1,000 Units Intravenous Once  . insulin aspart  0-5 Units Subcutaneous QHS  . insulin aspart  0-9 Units Subcutaneous TID WC  . losartan  25 mg Oral Daily  . metoprolol tartrate  12.5 mg Oral BID  . montelukast  10 mg Oral QHS  . sodium chloride flush  3 mL Intravenous Q12H  . tamsulosin  0.4 mg Oral QPC supper   Continuous Infusions: . sodium chloride    . heparin 800 Units/hr (09/01/19 2152)   PRN Meds: sodium chloride, acetaminophen, ipratropium-albuterol, ondansetron (ZOFRAN) IV, sodium chloride flush  Allergies:    Allergies  Allergen Reactions  . Amoxicillin     Makes eyes turn yellow and burn  . Ciprofloxacin     Pt not sure    Social History:   Social History   Socioeconomic History  . Marital status: Married    Spouse name: Not on file  . Number of children: Not on file  . Years of education: Not on file  . Highest education level: Not on file  Occupational History  . Not on file  Tobacco Use  . Smoking status: Former Smoker    Packs/day: 1.00    Years: 15.00    Pack years: 15.00    Types: Cigarettes    Start date: 07/14/1957    Quit date: 08/09/1976    Years since quitting: 43.0  . Smokeless tobacco: Former Systems developer    Types: Chew    Quit date: 02/10/2006  . Tobacco comment: chewed tobacco for 10 years  Substance and Sexual  Activity  . Alcohol use: Not on file  . Drug use: Not on file  . Sexual activity: Not on file  Other Topics Concern  . Not on file  Social History Narrative   Lives with wife.  Three children.     Social Determinants of Health   Financial Resource Strain:   . Difficulty of Paying Living Expenses: Not on file  Food Insecurity:   . Worried About Charity fundraiser in the Last Year: Not on file  . Ran Out of Food in the Last Year: Not on file  Transportation Needs:   . Lack of Transportation (Medical): Not on file  . Lack of Transportation (Non-Medical): Not on file  Physical Activity:   . Days of Exercise per Week: Not on file  .  Minutes of Exercise per Session: Not on file  Stress:   . Feeling of Stress : Not on file  Social Connections:   . Frequency of Communication with Friends and Family: Not on file  . Frequency of Social Gatherings with Friends and Family: Not on file  . Attends Religious Services: Not on file  . Active Member of Clubs or Organizations: Not on file  . Attends Archivist Meetings: Not on file  . Marital Status: Not on file  Intimate Partner Violence:   . Fear of Current or Ex-Partner: Not on file  . Emotionally Abused: Not on file  . Physically Abused: Not on file  . Sexually Abused: Not on file    Family History:    Family History  Problem Relation Age of Onset  . Lung cancer Brother   . Stroke Mother   . Breast cancer Mother      ROS:  Please see the history of present illness.  He has a chronic cough from COPD. It is currently improved. He has not had melena, hematochezia, hematuria, vomiting, diarrhea.  All other ROS reviewed and negative.     Physical Exam/Data:   Vitals:   09/01/19 2221 09/02/19 0045 09/02/19 0546 09/02/19 0811  BP: (!) 98/54 (!) 89/52 (!) 90/55 (!) 88/63  Pulse: 76 73 79 72  Resp:  20 16 20   Temp:  98.3 F (36.8 C) 98.2 F (36.8 C) (!) 97.5 F (36.4 C)  TempSrc:   Oral Oral  SpO2: 95% 98% 99% 96%    Weight:   66.9 kg   Height:        Intake/Output Summary (Last 24 hours) at 09/02/2019 0919 Last data filed at 09/02/2019 0900 Gross per 24 hour  Intake 774.78 ml  Output 1250 ml  Net -475.22 ml   Last 3 Weights 09/02/2019 09/01/2019 09/01/2019  Weight (lbs) 147 lb 7.8 oz 145 lb 11.2 oz 146 lb 12.5 oz  Weight (kg) 66.9 kg 66.089 kg 66.58 kg     Body mass index is 23.81 kg/m.  General:  Well nourished, well developed, in no acute distress  HEENT: normal Lymph: no adenopathy Neck: JVP 6 cm  Endocrine:  No thryomegaly Cardiac:  Distant heart sounds, RRR, no obvious murmur Lungs:  Decreased breath sounds bilat; L basilar crackles  Abd: soft, nontender, no hepatomegaly  Ext: no edema Musculoskeletal:  No deformities Skin: warm and dry  Neuro:  CNs 2-12 intact, no focal abnormalities noted Psych:  Normal affect   EKG:  The EKG was personally reviewed and demonstrates:  Sinus rhythm, first-degree AV block, left axis deviation, right bundle branch block, T wave inversions V4-V6, QTC 556; similar to prior tracings  Telemetry:  Telemetry was personally reviewed and demonstrates:  Normal sinus rhythm   Relevant CV Studies: Echocardiogram 08/30/2019  1. Left ventricular ejection fraction, by visual estimation, is 25 to 30%. The left ventricle has severely decreased function. There is no left ventricular hypertrophy.  2. Elevated left ventricular end-diastolic pressure.  3. Left ventricular diastolic parameters are consistent with Grade II diastolic dysfunction (pseudonormalization).  4. Mild to moderately dilated left ventricular internal cavity size.  5. The left ventricle demonstrates global hypokinesis.  6. Global right ventricle has normal systolic function.The right ventricular size is normal. Mildly increased right ventricular wall thickness.  7. Left atrial size was severely dilated.  8. Right atrial size was moderately dilated.  9. The mitral valve is grossly normal. Mild mitral  valve regurgitation. 10.  The tricuspid valve is grossly normal. 11. The tricuspid valve is grossly normal. Tricuspid valve regurgitation is mild. 12. The aortic valve is tricuspid. Aortic valve regurgitation is not visualized. No evidence of aortic valve sclerosis or stenosis. 13. The pulmonic valve was grossly normal. Pulmonic valve regurgitation is not visualized. 14. The inferior vena cava is normal in size with greater than 50% respiratory variability, suggesting right atrial pressure of 3 mmHg.   Laboratory Data:  High Sensitivity Troponin:   Recent Labs  Lab 08/31/19 1220 08/31/19 1414  TROPONINIHS 39* 40*     Chemistry Recent Labs  Lab 08/31/19 1220 09/01/19 0610  NA 139 137  K 4.0 3.8  CL 103 99  CO2 27 29  GLUCOSE 80 88  BUN 10 12  CREATININE 1.22 1.36*  CALCIUM 8.7* 8.8*  GFRNONAA 56* 49*  GFRAA >60 57*  ANIONGAP 9 9    Recent Labs  Lab 08/31/19 1220  PROT 6.7  ALBUMIN 3.4*  AST 22  ALT 14  ALKPHOS 34*  BILITOT 0.7   Hematology Recent Labs  Lab 08/31/19 1220 09/02/19 0717  WBC 3.9* 5.4  RBC 4.12* 4.27  HGB 11.0* 11.4*  HCT 37.6* 37.7*  MCV 91.3 88.3  MCH 26.7 26.7  MCHC 29.3* 30.2  RDW 13.7 13.6  PLT 272 278   BNP Recent Labs  Lab 08/31/19 1220  BNP 778.0*    DDimer No results for input(s): DDIMER in the last 168 hours.   Radiology/Studies:  DG Chest 2 View  Result Date: 09/01/2019 CLINICAL DATA:  Shortness of breath and chest pain for several days, history asthma, hypertension, diabetes mellitus, negative for COVID-19 on 01/22 EXAM: CHEST - 2 VIEW COMPARISON:  08/31/2019 FINDINGS: Minimal enlargement of cardiac silhouette. Mediastinal contours and pulmonary vascularity normal. LEFT lower lobe consolidation consistent with pneumonia. Remaining lungs minimally hyperinflated but clear. No pleural effusion or pneumothorax. IMPRESSION: Minimal enlargement of cardiac silhouette. LEFT lower lobe infiltrate consistent with pneumonia.  Electronically Signed   By: Lavonia Dana M.D.   On: 09/01/2019 13:49   DG Chest Portable 1 View  Result Date: 08/31/2019 CLINICAL DATA:  Shortness of breath.  Left chest pressure. EXAM: PORTABLE CHEST 1 VIEW COMPARISON:  10/21/2016. FINDINGS: Cardiomegaly mild pulmonary venous congestion. Diffuse bilateral interstitial prominence. Interstitial edema and/or pneumonitis could present this fashion. Tiny bilateral pleural effusions. No pneumothorax. IMPRESSION: 1.  Cardiomegaly with pulmonary venous congestion. 2. Diffuse bilateral interstitial prominence interstitial edema and/or pneumonitis could present this fashion. Tiny bilateral pleural effusions. Electronically Signed   By: Marcello Moores  Register   On: 08/31/2019 12:39   ECHOCARDIOGRAM COMPLETE  Result Date: 08/31/2019   ECHOCARDIOGRAM REPORT   Patient Name:   Gregory Mitchell Date of Exam: 08/31/2019 Medical Rec #:  [id number]      Height:       66.0 in Accession #:    [id number]     Weight:       173.5 lb Date of Birth:  1941-06-30      BSA:          1.88 m Patient Age:    23 years       BP:           121/74 mmHg Patient Gender: M              HR:           74 bpm. Exam Location:  [name] Procedure: 2D Echo Indications:    Congestive Heart Failure 428.0 /  I50.9  History:        Patient has prior history of Echocardiogram examinations, most                 recent 05/28/2016. CHF, COPD, Signs/Symptoms:Chest Pain and                 Fever; Risk Factors:Hypertension, Diabetes and Former Smoker.                 Chronic renal insufficiency.  Sonographer:    [name] RDCS (AE) Referring Phys: [place]  1. Left ventricular ejection fraction, by visual estimation, is 25 to 30%. The left ventricle has severely decreased function. There is no left ventricular hypertrophy.  2. Elevated left ventricular end-diastolic pressure.  3. Left ventricular diastolic parameters are consistent with Grade II diastolic dysfunction (pseudonormalization).  4.  Mild to moderately dilated left ventricular internal cavity size.  5. The left ventricle demonstrates global hypokinesis.  6. Global right ventricle has normal systolic function.The right ventricular size is normal. Mildly increased right ventricular wall thickness.  7. Left atrial size was severely dilated.  8. Right atrial size was moderately dilated.  9. The mitral valve is grossly normal. Mild mitral valve regurgitation. 10. The tricuspid valve is grossly normal. 11. The tricuspid valve is grossly normal. Tricuspid valve regurgitation is mild. 12. The aortic valve is tricuspid. Aortic valve regurgitation is not visualized. No evidence of aortic valve sclerosis or stenosis. 13. The pulmonic valve was grossly normal. Pulmonic valve regurgitation is not visualized. 14. The inferior vena cava is normal in size with greater than 50% respiratory variability, suggesting right atrial pressure of 3 mmHg. FINDINGS  Left Ventricle: Left ventricular ejection fraction, by visual estimation, is 25 to 30%. The left ventricle has severely decreased function. The left ventricle demonstrates global hypokinesis. The left ventricular internal cavity size was mildly to moderately dilated left ventricle. There is no left ventricular hypertrophy. Left ventricular diastolic parameters are consistent with Grade II diastolic dysfunction (pseudonormalization). Elevated left ventricular end-diastolic pressure. Right Ventricle: The right ventricular size is normal. Mildly increased right ventricular wall thickness. Global RV systolic function is has normal systolic function. Left Atrium: Left atrial size was severely dilated. Right Atrium: Right atrial size was moderately dilated Pericardium: There is no evidence of pericardial effusion. Mitral Valve: The mitral valve is grossly normal. Mild mitral valve regurgitation. Tricuspid Valve: The tricuspid valve is grossly normal. Tricuspid valve regurgitation is mild. Aortic Valve: The aortic  valve is tricuspid. Aortic valve regurgitation is not visualized. The aortic valve is structurally normal, with no evidence of sclerosis or stenosis. Pulmonic Valve: The pulmonic valve was grossly normal. Pulmonic valve regurgitation is not visualized. Pulmonic regurgitation is not visualized. Aorta: The aortic root is normal in size and structure. Venous: The inferior vena cava is normal in size with greater than 50% respiratory variability, suggesting right atrial pressure of 3 mmHg. IAS/Shunts: No atrial level shunt detected by color flow Doppler.  LEFT VENTRICLE PLAX 2D LVIDd:         6.37 cm  Diastology LVIDs:         5.33 cm  LV e' lateral:   11.00 cm/s LV PW:         1.08 cm  LV E/e' lateral: 6.7 LV IVS:        0.89 cm  LV e' medial:    4.13 cm/s LVOT diam:     2.30 cm  LV E/e' medial:  17.7 LV  SV:         69 ml LV SV Index:   35.78 LVOT Area:     4.15 cm  RIGHT VENTRICLE RV S prime:     19.40 cm/s TAPSE (M-mode): 3.1 cm LEFT ATRIUM             Index       RIGHT ATRIUM           Index LA diam:        4.80 cm 2.55 cm/m  RA Area:     21.60 cm LA Vol (A2C):   89.8 ml 47.70 ml/m RA Volume:   71.90 ml  38.19 ml/m LA Vol (A4C):   79.5 ml 42.23 ml/m LA Biplane Vol: 87.2 ml 46.32 ml/m   AORTA Ao Root diam: 3.10 cm MITRAL VALVE MV Area (PHT): 5.13 cm             SHUNTS MV PHT:        42.92 msec           Systemic Diam: 2.30 cm MV Decel Time: 148 msec MV E velocity: 73.20 cm/s 103 cm/s MV A velocity: 37.30 cm/s 70.3 cm/s MV E/A ratio:  1.96       1.5  Kate Sable MD Electronically signed by Kate Sable MD Signature Date/Time: 08/31/2019/4:26:13 PM    Final          Assessment and Plan:   1.  Acute on chronic combined systolic and diastolic congestive heart failure Gregory Mitchell has hx of non-ischemic cardiomyopathy with EF as low as 35-40% in the past.  It had recovered to normal EF in 2017.  He was admitted to [facility] in 03/2019 with pneumonia and pulmonary emboli.  He had seemingly done  well after that admission until the last couple of weeks.  He is now admitted with decompensated heart failure with reduced LVF with EF 25-30% on echocardiogram.  His elevated Troponin is likely related to demand ischemia.  He has had significant chest pain as well.  He last had a heart cath in 2013 with minimal plaque.  Given his significant risk factors for coronary artery disease and reduced LVF, we will proceed with R&L cardiac catheterization to better define his cardiomyopathy.  Risks and benefits of cardiac catheterization have been discussed with the patient.  These include bleeding, infection, kidney damage, stroke, heart attack, death.  The patient understands these risks and is willing to proceed.   -Plan R&L heart cath tomorrow  -As BP will allow consider changing Metoprolol to Carvedilol, Losartan to Entresto; +/- Spironolactone  -Start daily diuretic after cath tomorrow   2.  Elevated troponin Likely demand ischemia.  There is no significant delta.  Plan R&L heart cath as noted.  3.  Chronic kidney disease Creatinine fairly stable.  He does have a solitary kidney.  His BP has been running low this AM. Will hold his Losartan for now.  Losartan will need to be resumed post cath for management of CHF.    4.  COPD Management per primary service.    5.  History of pulmonary embolism He will need to remain on long term Apixaban.    6.  Hypertension As noted, his BP is running low. Hold Losartan for now.       For questions or updates, please contact [place] Please consult [email or web address] for contact info under   Signed, Richardson Dopp, PA-C  09/02/2019 9:19 AM   The patient was seen and examined,  and I agree with the history, physical exam, assessment and plan as documented above, with modifications as noted below. I have also personally reviewed all relevant documentation, old records, labs, and both radiographic and cardiovascular studies. I have also independently  interpreted old and new ECG's.  Briefly, 79 yo male with chronic chest pains, NICM, and nonobstructive CAD by cath in 2013. Also has h/o unprovoked PE for which he takes apixaban. He presented to [facility] with worsening DOE and found to have acute systolic heart failure. He had chest pains when he was hypervolemic. After diuresis and becoming euvolemic, he continued to complain of exertional chest pains. He has had them for 1-2 weeks prior to admission. Echo showed worsened LV systolic function, EF [id number], which had normalized by echo in 2017. Dr. Dyann Kief called me yesterday and it was decided to transfer him to [facility] for right and left heart cath/coronary angiography on 1/25. Currently on IV heparin. Hypotensive but asymptomatic at rest with respect to this. Will dc losartan. Beta blocker ordered with hold parameters. Once BP improves and prior to discharge, would transition Lopressor to Coreg or Toprol-XL. Consider switching losartan to [facility] as well as BP permits.   Kate Sable, MD, [facility]  09/02/2019 9:24 AM

## 2019-09-02 NOTE — Progress Notes (Addendum)
[facility]                                                                              Patient Demographics  Gregory Mitchell, is a 79 y.o. male, DOB - [birth year], [id number]  Admit date - 08/31/2019   Admitting Physician Barton Dubois, MD  Outpatient Primary MD for the patient is Neale Burly, MD  Outpatient specialists:   LOS - 2  days    Chief Complaint  Patient presents with  . Chest Pain       Brief summary  78 y.o.malewith a past medical history significant for asthma/COPD, chronic respiratory failure (using 2 L nasal cannula supplementation at bedtime only), hypertension, type 2 diabetes mellitus with nephropathy, chronic kidney disease a stage IIIa and chronic diastolic heart failure presenting with chest pain or shortness of breath presenting with chest pain with shortness of breath associated with elevated associated with elevated B-type natriuretic peptide and troponin.  Patient admitted for acute on chronic patient admitted for acute on chronic respiratory failure with hypoxia in the setting of acute chronic systolic and diastolic CHF  Assessment & Plan    Principal Problem:   Acute on chronic diastolic CHF (congestive heart failure) (HCC) Active Problems:   Chronic renal insufficiency   Type 2 diabetes mellitus (HCC)   HTN (hypertension)   COPD (chronic obstructive pulmonary disease) (HCC)   Acute on chronic respiratory failure with hypoxia (HCC)   Acute on chronic respiratory failure with hypoxia, in the setting acute on chronic respiratory failure with hypoxia, in the setting of acute on chronic combined CHF Chest x-ray with vascular congestion Elevated B-type natriuretic peptide 2D echo indicated worsening systolic dysfunction with global hypokinesis Heparin drip was initiated and Eliquis discontinued with low-dose Lopressor and ARB on admission Cardiology consulted-plan for cardiac cath tomorrow 09/03/2019  COPD At  baseline Continue supplemental oxygen Continue nebulizers/bronchodilators   CKD Stage III Monitor renal function electrolytes     Type 2 diabetes mellitus with nephropathy Oral hypoglycemics on hold A1c 6.1% Glycemic control insulin sliding scale for now  BPH  Stable Continue Flomax   hypertension Continue current medications of beta-blocker and ARB Monitor hemodynamics  History of PE Resume Eliquis when heparin discontinued   DVT prophylaxis: Previously on Eliquis; now transition to heparin drip Code Status: Full code Family Communication: Wife updated at bedside Disposition Plan:  Home  Medications  Scheduled Meds: . heparin  1,000 Units Intravenous Once  . insulin aspart  0-5 Units Subcutaneous QHS  . insulin aspart  0-9 Units Subcutaneous TID WC  . metoprolol tartrate  12.5 mg Oral BID  . montelukast  10 mg Oral QHS  . sodium chloride flush  3 mL Intravenous Q12H  . tamsulosin  0.4 mg Oral QPC supper   Continuous Infusions: . sodium chloride    . heparin 900 Units/hr (09/02/19 0944)   PRN Meds:.sodium chloride, acetaminophen, ipratropium-albuterol, ondansetron (ZOFRAN) IV, sodium chloride flush   Antibiotics   Anti-infectives (From admission, onward)   None        Subjective:    no acute events reported overnight - blood pressure soft and will continue  to monitor    Objective:   Vitals:   09/01/19 2221 09/02/19 0045 09/02/19 0546 09/02/19 0811  BP: (!) 98/54 (!) 89/52 (!) 90/55 (!) 88/63  Pulse: 76 73 79 72  Resp:  20 16 20   Temp:  98.3 F (36.8 C) 98.2 F (36.8 C) (!) 97.5 F (36.4 C)  TempSrc:   Oral Oral  SpO2: 95% 98% 99% 96%  Weight:   66.9 kg   Height:        Intake/Output Summary (Last 24 hours) at 09/02/2019 1004 Last data filed at 09/02/2019 0900 Gross per 24 hour  Intake 774.78 ml  Output 1250 ml  Net -475.22 ml     Wt Readings from Last 3 Encounters:  09/02/19 66.9 kg  04/10/19 78.7 kg  08/22/18 84.8 kg      Exam  General: NAD  HEENT: NCAT,  PERRL,MMM  Neck: SUPPLE, (-) JVD  Cardiovascular: RRR, (-) GALLOP, (-) MURMUR  Respiratory:  Decreased breath sounds at the bases with few occasional wheezing  Gastrointestinal: SOFT, (-) DISTENSION, BS(+), (_) TENDERNESS  Ext: (-) CYANOSIS, (-) EDEMA  Neuro: A, OX 3    Data Reviewed:  I have personally reviewed following labs and imaging studies  Micro Results Recent Results (from the past 240 hour(s))  Respiratory Panel by RT PCR (Flu A&B, Covid) - Nasopharyngeal Swab     Status: None   Collection Time: 08/31/19 12:04 PM   Specimen: Nasopharyngeal Swab  Result Value Ref Range Status   SARS Coronavirus 2 by RT PCR NEGATIVE NEGATIVE Final    Comment: (NOTE) SARS-CoV-2 target nucleic acids are NOT DETECTED. The SARS-CoV-2 RNA is generally detectable in upper respiratoy specimens during the acute phase of infection. The lowest concentration of SARS-CoV-2 viral copies this assay can detect is 131 copies/mL. A negative result does not preclude SARS-Cov-2 infection and should not be used as the sole basis for treatment or other patient management decisions. A negative result may occur with  improper specimen collection/handling, submission of specimen other than nasopharyngeal swab, presence of viral mutation(s) within the areas targeted by this assay, and inadequate number of viral copies (<131 copies/mL). A negative result must be combined with clinical observations, patient history, and epidemiological information. The expected result is Negative. Fact Sheet for Patients:  PinkCheek.be Fact Sheet for Healthcare Providers:  GravelBags.it This test is not yet ap proved or cleared by the [place] FDA and  has been authorized for detection and/or diagnosis of SARS-CoV-2 by FDA under an Emergency Use Authorization (EUA). This EUA will remain  in effect (meaning this test  can be used) for the duration of the COVID-19 declaration under Section 564(b)(1) of the Act, 21 U.S.C. section 360bbb-3(b)(1), unless the authorization is terminated or revoked sooner.    Influenza A by PCR NEGATIVE NEGATIVE Final   Influenza B by PCR NEGATIVE NEGATIVE Final    Comment: (NOTE) The Xpert Xpress SARS-CoV-2/FLU/RSV assay is intended as an aid in  the diagnosis of influenza from Nasopharyngeal swab specimens and  should not be used as a sole basis for treatment. Nasal washings and  aspirates are unacceptable for Xpert Xpress SARS-CoV-2/FLU/RSV  testing. Fact Sheet for Patients: PinkCheek.be Fact Sheet for Healthcare Providers: GravelBags.it This test is not yet approved or cleared by the [place] FDA and  has been authorized for detection and/or diagnosis of SARS-CoV-2 by  FDA under an Emergency Use Authorization (EUA). This EUA will remain  in effect (meaning this test can be used) for  the duration of the  Covid-19 declaration under Section 564(b)(1) of the Act, 21  U.S.C. section 360bbb-3(b)(1), unless the authorization is  terminated or revoked. Performed at [facility], [address], [place], [place] [address]     Radiology Reports DG Chest 2 View  Result Date: 09/01/2019 CLINICAL DATA:  Shortness of breath and chest pain for several days, history asthma, hypertension, diabetes mellitus, negative for COVID-19 on 01/22 EXAM: CHEST - 2 VIEW COMPARISON:  08/31/2019 FINDINGS: Minimal enlargement of cardiac silhouette. Mediastinal contours and pulmonary vascularity normal. LEFT lower lobe consolidation consistent with pneumonia. Remaining lungs minimally hyperinflated but clear. No pleural effusion or pneumothorax. IMPRESSION: Minimal enlargement of cardiac silhouette. LEFT lower lobe infiltrate consistent with pneumonia. Electronically Signed   By: Lavonia Dana M.D.   On: 09/01/2019 13:49   DG Chest Portable  1 View  Result Date: 08/31/2019 CLINICAL DATA:  Shortness of breath.  Left chest pressure. EXAM: PORTABLE CHEST 1 VIEW COMPARISON:  10/21/2016. FINDINGS: Cardiomegaly mild pulmonary venous congestion. Diffuse bilateral interstitial prominence. Interstitial edema and/or pneumonitis could present this fashion. Tiny bilateral pleural effusions. No pneumothorax. IMPRESSION: 1.  Cardiomegaly with pulmonary venous congestion. 2. Diffuse bilateral interstitial prominence interstitial edema and/or pneumonitis could present this fashion. Tiny bilateral pleural effusions. Electronically Signed   By: Marcello Moores  Register   On: 08/31/2019 12:39   ECHOCARDIOGRAM COMPLETE  Result Date: 08/31/2019   ECHOCARDIOGRAM REPORT   Patient Name:   JAMEZ LEVEE Date of Exam: 08/31/2019 Medical Rec #:  [id number]      Height:       66.0 in Accession #:    [id number]     Weight:       173.5 lb Date of Birth:  1941-01-07      BSA:          1.88 m Patient Age:    37 years       BP:           121/74 mmHg Patient Gender: M              HR:           74 bpm. Exam Location:  [name] Procedure: 2D Echo Indications:    Congestive Heart Failure 428.0 / I50.9  History:        Patient has prior history of Echocardiogram examinations, most                 recent 05/28/2016. CHF, COPD, Signs/Symptoms:Chest Pain and                 Fever; Risk Factors:Hypertension, Diabetes and Former Smoker.                 Chronic renal insufficiency.  Sonographer:    [name] RDCS (AE) Referring Phys: [place]  1. Left ventricular ejection fraction, by visual estimation, is 25 to 30%. The left ventricle has severely decreased function. There is no left ventricular hypertrophy.  2. Elevated left ventricular end-diastolic pressure.  3. Left ventricular diastolic parameters are consistent with Grade II diastolic dysfunction (pseudonormalization).  4. Mild to moderately dilated left ventricular internal cavity size.  5. The left ventricle  demonstrates global hypokinesis.  6. Global right ventricle has normal systolic function.The right ventricular size is normal. Mildly increased right ventricular wall thickness.  7. Left atrial size was severely dilated.  8. Right atrial size was moderately dilated.  9. The mitral valve is grossly normal. Mild mitral valve regurgitation.  10. The tricuspid valve is grossly normal. 11. The tricuspid valve is grossly normal. Tricuspid valve regurgitation is mild. 12. The aortic valve is tricuspid. Aortic valve regurgitation is not visualized. No evidence of aortic valve sclerosis or stenosis. 13. The pulmonic valve was grossly normal. Pulmonic valve regurgitation is not visualized. 14. The inferior vena cava is normal in size with greater than 50% respiratory variability, suggesting right atrial pressure of 3 mmHg. FINDINGS  Left Ventricle: Left ventricular ejection fraction, by visual estimation, is 25 to 30%. The left ventricle has severely decreased function. The left ventricle demonstrates global hypokinesis. The left ventricular internal cavity size was mildly to moderately dilated left ventricle. There is no left ventricular hypertrophy. Left ventricular diastolic parameters are consistent with Grade II diastolic dysfunction (pseudonormalization). Elevated left ventricular end-diastolic pressure. Right Ventricle: The right ventricular size is normal. Mildly increased right ventricular wall thickness. Global RV systolic function is has normal systolic function. Left Atrium: Left atrial size was severely dilated. Right Atrium: Right atrial size was moderately dilated Pericardium: There is no evidence of pericardial effusion. Mitral Valve: The mitral valve is grossly normal. Mild mitral valve regurgitation. Tricuspid Valve: The tricuspid valve is grossly normal. Tricuspid valve regurgitation is mild. Aortic Valve: The aortic valve is tricuspid. Aortic valve regurgitation is not visualized. The aortic valve is  structurally normal, with no evidence of sclerosis or stenosis. Pulmonic Valve: The pulmonic valve was grossly normal. Pulmonic valve regurgitation is not visualized. Pulmonic regurgitation is not visualized. Aorta: The aortic root is normal in size and structure. Venous: The inferior vena cava is normal in size with greater than 50% respiratory variability, suggesting right atrial pressure of 3 mmHg. IAS/Shunts: No atrial level shunt detected by color flow Doppler.  LEFT VENTRICLE PLAX 2D LVIDd:         6.37 cm  Diastology LVIDs:         5.33 cm  LV e' lateral:   11.00 cm/s LV PW:         1.08 cm  LV E/e' lateral: 6.7 LV IVS:        0.89 cm  LV e' medial:    4.13 cm/s LVOT diam:     2.30 cm  LV E/e' medial:  17.7 LV SV:         69 ml LV SV Index:   35.78 LVOT Area:     4.15 cm  RIGHT VENTRICLE RV S prime:     19.40 cm/s TAPSE (M-mode): 3.1 cm LEFT ATRIUM             Index       RIGHT ATRIUM           Index LA diam:        4.80 cm 2.55 cm/m  RA Area:     21.60 cm LA Vol (A2C):   89.8 ml 47.70 ml/m RA Volume:   71.90 ml  38.19 ml/m LA Vol (A4C):   79.5 ml 42.23 ml/m LA Biplane Vol: 87.2 ml 46.32 ml/m   AORTA Ao Root diam: 3.10 cm MITRAL VALVE MV Area (PHT): 5.13 cm             SHUNTS MV PHT:        42.92 msec           Systemic Diam: 2.30 cm MV Decel Time: 148 msec MV E velocity: 73.20 cm/s 103 cm/s MV A velocity: 37.30 cm/s 70.3 cm/s MV E/A ratio:  1.96  1.5  Kate Sable MD Electronically signed by Kate Sable MD Signature Date/Time: 08/31/2019/4:26:13 PM    Final     Lab Data:  CBC: Recent Labs  Lab 08/31/19 1220 09/02/19 0717  WBC 3.9* 5.4  NEUTROABS 2.0  --   HGB 11.0* 11.4*  HCT 37.6* 37.7*  MCV 91.3 88.3  PLT 272 0000000   Basic Metabolic Panel: Recent Labs  Lab 08/31/19 1220 09/01/19 0610  NA 139 137  K 4.0 3.8  CL 103 99  CO2 27 29  GLUCOSE 80 88  BUN 10 12  CREATININE 1.22 1.36*  CALCIUM 8.7* 8.8*  MG 1.8  --    GFR: Estimated Creatinine Clearance: 39.7  mL/min (A) (by C-G formula based on SCr of 1.36 mg/dL (H)). Liver Function Tests: Recent Labs  Lab 08/31/19 1220  AST 22  ALT 14  ALKPHOS 34*  BILITOT 0.7  PROT 6.7  ALBUMIN 3.4*   No results for input(s): LIPASE, AMYLASE in the last 168 hours. No results for input(s): AMMONIA in the last 168 hours. Coagulation Profile: No results for input(s): INR, PROTIME in the last 168 hours. Cardiac Enzymes: No results for input(s): CKTOTAL, CKMB, CKMBINDEX, TROPONINI in the last 168 hours. BNP (last 3 results) No results for input(s): PROBNP in the last 8760 hours. HbA1C: Recent Labs    08/31/19 1220  HGBA1C 6.1*   CBG: Recent Labs  Lab 09/01/19 0758 09/01/19 1154 09/01/19 1629 09/01/19 2100 09/02/19 0616  GLUCAP 86 93 77 87 91   Lipid Profile: No results for input(s): CHOL, HDL, LDLCALC, TRIG, CHOLHDL, LDLDIRECT in the last 72 hours. Thyroid Function Tests: Recent Labs    08/31/19 1220  TSH 1.017   Anemia Panel: No results for input(s): VITAMINB12, FOLATE, FERRITIN, TIBC, IRON, RETICCTPCT in the last 72 hours. Urine analysis: No results found for: COLORURINE, APPEARANCEUR, LABSPEC, PHURINE, GLUCOSEU, HGBUR, BILIRUBINUR, KETONESUR, PROTEINUR, UROBILINOGEN, NITRITE, Milus Height M.D. [facility] 09/02/2019, 10:04 AM  Pager: [phone] Between 7am to 7pm - call Pager - [phone]  After 7pm go to [email or web address] - password [email or web address]  Call night coverage person covering after 7pm

## 2019-09-02 NOTE — Progress Notes (Addendum)
ANTICOAGULATION CONSULT NOTE - Initial Consult  Pharmacy Consult for heparin gtt  Indication: chest pain/ACS  Allergies  Allergen Reactions  . Amoxicillin     Makes eyes turn yellow and burn  . Ciprofloxacin     Pt not sure    Patient Measurements: Height: 5\' 6"  (167.6 cm) Weight: 147 lb 7.8 oz (66.9 kg) IBW/kg (Calculated) : 63.8 Heparin Dosing Weight: 66 kg   Vital Signs: Temp: 98.2 F (36.8 C) (01/24 0546) Temp Source: Oral (01/24 0546) BP: 90/55 (01/24 0546) Pulse Rate: 79 (01/24 0546)  Labs: Recent Labs    08/31/19 1220 09/01/19 0610  HGB 11.0*  --   HCT 37.6*  --   PLT 272  --   CREATININE 1.22 1.36*    Estimated Creatinine Clearance: 39.7 mL/min (A) (by C-G formula based on SCr of 1.36 mg/dL (H)).   Medical History: Past Medical History:  Diagnosis Date  . Asthma   . Chronic renal insufficiency   . COPD (chronic obstructive pulmonary disease) ([place])   . Hypertension   . Renal insufficiency   . Type 2 diabetes mellitus (HCC)     Medications:  Medications Prior to Admission  Medication Sig Dispense Refill Last Dose  . albuterol (PROAIR HFA) 108 (90 BASE) MCG/ACT inhaler Inhale 2 puffs into the lungs every 6 (six) hours as needed for wheezing or shortness of breath.    unknown  . apixaban (ELIQUIS) 5 MG TABS tablet Take 5 mg by mouth 2 (two) times daily.   08/30/2019 at Unknown time  . ipratropium-albuterol (DUONEB) 0.5-2.5 (3) MG/3ML SOLN Take 3 mLs by nebulization every 6 (six) hours as needed (for shortness of breath/wheezing).    unknown  . losartan (COZAAR) 50 MG tablet TAKE 1 TABLET BY MOUTH TWICE DAILY (Patient taking differently: Take 50 mg by mouth daily. ) 60 tablet 6 08/30/2019 at Unknown time  . metFORMIN (GLUCOPHAGE-XR) 500 MG 24 hr tablet Take 1,000 mg by mouth 2 (two) times daily.   08/30/2019 at Unknown time  . methylPREDNISolone (MEDROL DOSEPAK) 4 MG TBPK tablet Take 4-24 mg by mouth See admin instructions. Take as directed per package  instructions for 6 days starting on 08/24/2019   08/30/2019 at Unknown time  . Omega-3 Fatty Acids (FISH OIL) 1000 MG CAPS Take 1 capsule by mouth 2 (two) times daily.    08/30/2019 at Unknown time  . OXYGEN Inhale 2 L into the lungs at bedtime as needed (for shortness of breath).   Past Week at Unknown time  . tamsulosin (FLOMAX) 0.4 MG CAPS capsule Take 0.4 mg by mouth daily.   0 08/30/2019 at Unknown time   Scheduled:  . insulin aspart  0-5 Units Subcutaneous QHS  . insulin aspart  0-9 Units Subcutaneous TID WC  . losartan  25 mg Oral Daily  . metoprolol tartrate  12.5 mg Oral BID  . montelukast  10 mg Oral QHS  . sodium chloride flush  3 mL Intravenous Q12H  . tamsulosin  0.4 mg Oral QPC supper   Infusions:  . sodium chloride    . heparin 800 Units/hr (09/01/19 2152)   PRN: sodium chloride, acetaminophen, ipratropium-albuterol, ondansetron (ZOFRAN) IV, sodium chloride flush Anti-infectives (From admission, onward)   None      Assessment: 3 yom presented with chest pain initially thought to be due to CHF exacerbation but now cardiology considering cath intervention so patient to be transitioned off apixaban to heparin infusion, pharmacy to dose heparin. Patient is on apixaban PTA  and during his admission so aPTT/HL must correlate before transitioning to only HL monitoring. Last dose of apixaban 9am on 1/23    The 0500 HL and aPTT that were ordered were canceled upon patient transfer. A STAT HL and aPTT were ordered at 0709 this am. Heparin level this morning is "therapeutic" at 0.52, aPTT is sub-therapeutic at 36 seconds on 800 units/hour. Levels are not correlating yet which is expected given patient renal function and proximity of last dose. H&H is stable at 11.4/37.7, plts wnl at 278. No bleeding noted.  Goal of Therapy:  APTT 66-102 seconds  Heparin level 0.3-0.7 units/ml Monitor platelets by anticoagulation protocol: Yes   Plan:  Give heparin 1000 units bolus x 1 Increase  heparin infusion to 900 units/hr Check aPTT level in 8 hours and daily aPTT/Heparin level while on heparin until correlation Continue to monitor H&H and platelets Monitor for signs of bleeding  Follow up Cardiology work up    Thank you,   Eddie Candle, PharmD PGY-1 Pharmacy Resident   Please check amion for clinical pharmacist contact number   09/02/2019,7:10 AM

## 2019-09-02 NOTE — Progress Notes (Signed)
ANTICOAGULATION CONSULT NOTE - Follow Up Consult  Pharmacy Consult for heparin gtt  Indication: chest pain/ACS  Allergies  Allergen Reactions  . Amoxicillin     Makes eyes turn yellow and burn  . Ciprofloxacin     Pt not sure    Patient Measurements: Height: 5\' 6"  (167.6 cm) Weight: 147 lb 7.8 oz (66.9 kg) IBW/kg (Calculated) : 63.8 Heparin Dosing Weight: 66 kg   Vital Signs: Temp: 98.3 F (36.8 C) (01/24 1201) Temp Source: Oral (01/24 1201) BP: 100/66 (01/24 1201) Pulse Rate: 77 (01/24 1201)  Labs: Recent Labs    08/31/19 1220 09/01/19 0610 09/02/19 0717 09/02/19 1035 09/02/19 1624  HGB 11.0*  --  11.4*  --   --   HCT 37.6*  --  37.7*  --   --   PLT 272  --  278  --   --   APTT  --   --  36  --  36  HEPARINUNFRC  --   --  0.52  --   --   CREATININE 1.22 1.36*  --  1.65*  --     Estimated Creatinine Clearance: 32.8 mL/min (A) (by C-G formula based on SCr of 1.65 mg/dL (H)).   Medical History: Past Medical History:  Diagnosis Date  . Asthma   . Chronic renal insufficiency   . COPD (chronic obstructive pulmonary disease) ([place])   . Hypertension   . Renal insufficiency   . Type 2 diabetes mellitus (HCC)     Medications:  Medications Prior to Admission  Medication Sig Dispense Refill Last Dose  . albuterol (PROAIR HFA) 108 (90 BASE) MCG/ACT inhaler Inhale 2 puffs into the lungs every 6 (six) hours as needed for wheezing or shortness of breath.    unknown  . apixaban (ELIQUIS) 5 MG TABS tablet Take 5 mg by mouth 2 (two) times daily.   08/30/2019 at Unknown time  . ipratropium-albuterol (DUONEB) 0.5-2.5 (3) MG/3ML SOLN Take 3 mLs by nebulization every 6 (six) hours as needed (for shortness of breath/wheezing).    unknown  . losartan (COZAAR) 50 MG tablet TAKE 1 TABLET BY MOUTH TWICE DAILY (Patient taking differently: Take 50 mg by mouth daily. ) 60 tablet 6 08/30/2019 at Unknown time  . metFORMIN (GLUCOPHAGE-XR) 500 MG 24 hr tablet Take 1,000 mg by mouth 2 (two)  times daily.   08/30/2019 at Unknown time  . methylPREDNISolone (MEDROL DOSEPAK) 4 MG TBPK tablet Take 4-24 mg by mouth See admin instructions. Take as directed per package instructions for 6 days starting on 08/24/2019   08/30/2019 at Unknown time  . Omega-3 Fatty Acids (FISH OIL) 1000 MG CAPS Take 1 capsule by mouth 2 (two) times daily.    08/30/2019 at Unknown time  . OXYGEN Inhale 2 L into the lungs at bedtime as needed (for shortness of breath).   Past Week at Unknown time  . tamsulosin (FLOMAX) 0.4 MG CAPS capsule Take 0.4 mg by mouth daily.   0 08/30/2019 at Unknown time   Scheduled:  . [START ON 09/03/2019] aspirin  81 mg Oral Pre-Cath  . insulin aspart  0-5 Units Subcutaneous QHS  . insulin aspart  0-9 Units Subcutaneous TID WC  . metoprolol tartrate  12.5 mg Oral BID  . montelukast  10 mg Oral QHS  . sodium chloride flush  3 mL Intravenous Q12H  . sodium chloride flush  3 mL Intravenous Q12H  . tamsulosin  0.4 mg Oral QPC supper   Infusions:  .  sodium chloride    . sodium chloride    . [START ON 09/03/2019] sodium chloride    . heparin 900 Units/hr (09/02/19 0944)   PRN: sodium chloride, sodium chloride, acetaminophen, alum & mag hydroxide-simeth, ipratropium-albuterol, ondansetron (ZOFRAN) IV, sodium chloride flush, sodium chloride flush Anti-infectives (From admission, onward)   None      Assessment: 89 yom presented with chest pain initially thought to be due to CHF exacerbation but now cardiology considering cath intervention. Patient was transitioned off apixaban to heparin infusion and pharmacy has been consulted to dose heparin. Patient is on apixaban PTA and last dose noted on 1/23 at 9AM. aPTT/heparin level must correlate before transitioning to only heparin level monitoring.  Heparin drip rate was increased to 900 units/hr but aPTT remains low at 36 sec. No line issues noted per RN.  Goal of Therapy:  APTT 66-102 seconds  Heparin level 0.3-0.7 units/ml Monitor  platelets by anticoagulation protocol: Yes   Plan:  Give heparin 2000 units bolus x 1 Increase heparin infusion to 1100 units/hr Check aPTT level in 8 hours and daily aPTT/Heparin level while on heparin until correlation Daily CBC L/RHC tomorrow  Vertis Kelch, PharmD, [facility] PGY2 Cardiology Pharmacy Resident Phone [phone] 09/02/2019       5:07 PM  Please check [email or web address] for unit-specific pharmacist phone numbers

## 2019-09-03 ENCOUNTER — Encounter (HOSPITAL_COMMUNITY)
Admission: EM | Disposition: A | Discharge status: Home / Self Care | Payer: Self-pay | Source: Home / Self Care | Attending: Internal Medicine

## 2019-09-03 DIAGNOSIS — I5033 Acute on chronic diastolic (congestive) heart failure: Secondary | ICD-10-CM

## 2019-09-03 DIAGNOSIS — I453 Trifascicular block: Secondary | ICD-10-CM

## 2019-09-03 DIAGNOSIS — I428 Other cardiomyopathies: Secondary | ICD-10-CM

## 2019-09-03 DIAGNOSIS — J439 Emphysema, unspecified: Secondary | ICD-10-CM

## 2019-09-03 HISTORY — PX: RIGHT/LEFT HEART CATH AND CORONARY ANGIOGRAPHY: CATH118266

## 2019-09-03 LAB — POCT I-STAT EG7
Acid-Base Excess: 3 mmol/L — ABNORMAL HIGH (ref 0.0–2.0)
Acid-Base Excess: 4 mmol/L — ABNORMAL HIGH (ref 0.0–2.0)
Bicarbonate: 29.6 mmol/L — ABNORMAL HIGH (ref 20.0–28.0)
Bicarbonate: 30.4 mmol/L — ABNORMAL HIGH (ref 20.0–28.0)
Calcium, Ion: 1.23 mmol/L (ref 1.15–1.40)
Calcium, Ion: 1.23 mmol/L (ref 1.15–1.40)
HCT: 35 % — ABNORMAL LOW (ref 39.0–52.0)
HCT: 35 % — ABNORMAL LOW (ref 39.0–52.0)
Hemoglobin: 11.9 g/dL — ABNORMAL LOW (ref 13.0–17.0)
Hemoglobin: 11.9 g/dL — ABNORMAL LOW (ref 13.0–17.0)
O2 Saturation: 68 %
O2 Saturation: 70 %
Potassium: 4.1 mmol/L (ref 3.5–5.1)
Potassium: 4.3 mmol/L (ref 3.5–5.1)
Sodium: 140 mmol/L (ref 135–145)
Sodium: 140 mmol/L (ref 135–145)
TCO2: 31 mmol/L (ref 22–32)
TCO2: 32 mmol/L (ref 22–32)
pCO2, Ven: 53.6 mmHg (ref 44.0–60.0)
pCO2, Ven: 54.3 mmHg (ref 44.0–60.0)
pH, Ven: 7.35 (ref 7.250–7.430)
pH, Ven: 7.356 (ref 7.250–7.430)
pO2, Ven: 38 mmHg (ref 32.0–45.0)
pO2, Ven: 39 mmHg (ref 32.0–45.0)

## 2019-09-03 LAB — CBC
HCT: 38.1 % — ABNORMAL LOW (ref 39.0–52.0)
Hemoglobin: 11.5 g/dL — ABNORMAL LOW (ref 13.0–17.0)
MCH: 26.9 pg (ref 26.0–34.0)
MCHC: 30.2 g/dL (ref 30.0–36.0)
MCV: 89 fL (ref 80.0–100.0)
Platelets: 277 10*3/uL (ref 150–400)
RBC: 4.28 MIL/uL (ref 4.22–5.81)
RDW: 13.6 % (ref 11.5–15.5)
WBC: 6 10*3/uL (ref 4.0–10.5)
nRBC: 0 % (ref 0.0–0.2)

## 2019-09-03 LAB — HEPARIN LEVEL (UNFRACTIONATED): Heparin Unfractionated: 0.26 IU/mL — ABNORMAL LOW (ref 0.30–0.70)

## 2019-09-03 LAB — COMPREHENSIVE METABOLIC PANEL
ALT: 11 U/L (ref 0–44)
AST: 18 U/L (ref 15–41)
Albumin: 3 g/dL — ABNORMAL LOW (ref 3.5–5.0)
Alkaline Phosphatase: 42 U/L (ref 38–126)
Anion gap: 10 (ref 5–15)
BUN: 26 mg/dL — ABNORMAL HIGH (ref 8–23)
CO2: 25 mmol/L (ref 22–32)
Calcium: 8.7 mg/dL — ABNORMAL LOW (ref 8.9–10.3)
Chloride: 102 mmol/L (ref 98–111)
Creatinine, Ser: 1.79 mg/dL — ABNORMAL HIGH (ref 0.61–1.24)
GFR calc Af Amer: 41 mL/min — ABNORMAL LOW (ref >60)
GFR calc non Af Amer: 35 mL/min — ABNORMAL LOW (ref >60)
Glucose, Bld: 97 mg/dL (ref 70–99)
Potassium: 4.4 mmol/L (ref 3.5–5.1)
Sodium: 137 mmol/L (ref 135–145)
Total Bilirubin: 0.5 mg/dL (ref 0.3–1.2)
Total Protein: 6 g/dL — ABNORMAL LOW (ref 6.5–8.1)

## 2019-09-03 LAB — GLUCOSE, CAPILLARY
Glucose-Capillary: 88 mg/dL (ref 70–99)
Glucose-Capillary: 180 mg/dL — ABNORMAL HIGH (ref 70–99)
Glucose-Capillary: 79 mg/dL (ref 70–99)
Glucose-Capillary: 117 mg/dL — ABNORMAL HIGH (ref 70–99)

## 2019-09-03 LAB — POCT I-STAT 7, (LYTES, BLD GAS, ICA,H+H)
Acid-Base Excess: 2 mmol/L (ref 0.0–2.0)
Bicarbonate: 27.6 mmol/L (ref 20.0–28.0)
Calcium, Ion: 1.22 mmol/L (ref 1.15–1.40)
HCT: 35 % — ABNORMAL LOW (ref 39.0–52.0)
Hemoglobin: 11.9 g/dL — ABNORMAL LOW (ref 13.0–17.0)
O2 Saturation: 97 %
Potassium: 4.2 mmol/L (ref 3.5–5.1)
Sodium: 140 mmol/L (ref 135–145)
TCO2: 29 mmol/L (ref 22–32)
pCO2 arterial: 46.7 mmHg (ref 32.0–48.0)
pH, Arterial: 7.379 (ref 7.350–7.450)
pO2, Arterial: 94 mmHg (ref 83.0–108.0)

## 2019-09-03 LAB — APTT: aPTT: 55 seconds — ABNORMAL HIGH (ref 24–36)

## 2019-09-03 SURGERY — RIGHT/LEFT HEART CATH AND CORONARY ANGIOGRAPHY
Anesthesia: LOCAL

## 2019-09-03 MED ORDER — MELATONIN 3 MG PO TABS
6.0000 mg | ORAL_TABLET | Freq: Every evening | ORAL | Status: DC | PRN
  Administered 2019-09-03: 21:00:00 6 mg via ORAL
  Filled 2019-09-03 (×2): qty 2

## 2019-09-03 MED ORDER — ACETAMINOPHEN 325 MG PO TABS: 650.0000 mg | ORAL_TABLET | ORAL | Status: DC | PRN

## 2019-09-03 MED ORDER — SODIUM CHLORIDE 0.9 % IV SOLN: 250.0000 mL | INTRAVENOUS | Status: DC | PRN

## 2019-09-03 MED ORDER — ONDANSETRON HCL 4 MG/2ML IJ SOLN: 4.0000 mg | Freq: Four times a day (QID) | INTRAMUSCULAR | Status: DC | PRN

## 2019-09-03 MED ORDER — FENTANYL CITRATE (PF) 100 MCG/2ML IJ SOLN
INTRAMUSCULAR | Status: DC | PRN
  Administered 2019-09-03: 25 ug via INTRAVENOUS

## 2019-09-03 MED ORDER — NITROGLYCERIN 1 MG/10 ML FOR IR/CATH LAB
INTRA_ARTERIAL | Status: AC
  Filled 2019-09-03: qty 10

## 2019-09-03 MED ORDER — HEPARIN SODIUM (PORCINE) 1000 UNIT/ML IJ SOLN
INTRAMUSCULAR | Status: DC | PRN
  Administered 2019-09-03: 3500 [IU] via INTRAVENOUS

## 2019-09-03 MED ORDER — SODIUM CHLORIDE 0.9% FLUSH: 3.0000 mL | INTRAVENOUS | Status: DC | PRN

## 2019-09-03 MED ORDER — SODIUM CHLORIDE 0.9 % IV SOLN
INTRAVENOUS | Status: AC | PRN
  Administered 2019-09-03: 250 mL via INTRAVENOUS

## 2019-09-03 MED ORDER — SODIUM CHLORIDE 0.9% FLUSH
3.0000 mL | Freq: Two times a day (BID) | INTRAVENOUS | Status: DC
  Administered 2019-09-03 – 2019-09-04 (×2): 3 mL via INTRAVENOUS

## 2019-09-03 MED ORDER — ASPIRIN 81 MG PO CHEW
81.0000 mg | CHEWABLE_TABLET | Freq: Every day | ORAL | Status: DC
  Administered 2019-09-04: 09:00:00 81 mg via ORAL
  Filled 2019-09-03: qty 1

## 2019-09-03 MED ORDER — CARVEDILOL 3.125 MG PO TABS
3.1250 mg | ORAL_TABLET | Freq: Two times a day (BID) | ORAL | Status: DC
  Administered 2019-09-03 – 2019-09-04 (×2): 3.125 mg via ORAL
  Filled 2019-09-03 (×2): qty 1

## 2019-09-03 MED ORDER — KETOROLAC TROMETHAMINE 15 MG/ML IJ SOLN
15.0000 mg | Freq: Once | INTRAMUSCULAR | Status: AC
  Administered 2019-09-03: 02:00:00 15 mg via INTRAVENOUS
  Filled 2019-09-03: qty 1

## 2019-09-03 MED ORDER — SODIUM CHLORIDE 0.9 % IV SOLN: INTRAVENOUS | Status: DC

## 2019-09-03 MED ORDER — VERAPAMIL HCL 2.5 MG/ML IV SOLN
INTRA_ARTERIAL | Status: DC | PRN
  Administered 2019-09-03: 5 mL via INTRA_ARTERIAL

## 2019-09-03 MED ORDER — HEPARIN (PORCINE) IN NACL 1000-0.9 UT/500ML-% IV SOLN
INTRAVENOUS | Status: AC
  Filled 2019-09-03: qty 1000

## 2019-09-03 MED ORDER — FENTANYL CITRATE (PF) 100 MCG/2ML IJ SOLN
INTRAMUSCULAR | Status: AC
  Filled 2019-09-03: qty 2

## 2019-09-03 MED ORDER — HEPARIN (PORCINE) IN NACL 1000-0.9 UT/500ML-% IV SOLN
INTRAVENOUS | Status: DC | PRN
  Administered 2019-09-03 (×2): 500 mL

## 2019-09-03 MED ORDER — MORPHINE SULFATE (PF) 2 MG/ML IV SOLN: 2.0000 mg | INTRAVENOUS | Status: DC | PRN

## 2019-09-03 MED ORDER — IOHEXOL 350 MG/ML SOLN
INTRAVENOUS | Status: DC | PRN
  Administered 2019-09-03: 25 mL via INTRA_ARTERIAL

## 2019-09-03 MED ORDER — LIDOCAINE HCL (PF) 1 % IJ SOLN
INTRAMUSCULAR | Status: DC | PRN
  Administered 2019-09-03: 5 mL

## 2019-09-03 MED ORDER — HYDRALAZINE HCL 20 MG/ML IJ SOLN: 10.0000 mg | INTRAMUSCULAR | Status: DC | PRN

## 2019-09-03 MED ORDER — MIDAZOLAM HCL 2 MG/2ML IJ SOLN
INTRAMUSCULAR | Status: DC | PRN
  Administered 2019-09-03: 1 mg via INTRAVENOUS

## 2019-09-03 MED ORDER — HEPARIN SODIUM (PORCINE) 1000 UNIT/ML IJ SOLN
INTRAMUSCULAR | Status: AC
  Filled 2019-09-03: qty 1

## 2019-09-03 MED ORDER — MIDAZOLAM HCL 2 MG/2ML IJ SOLN
INTRAMUSCULAR | Status: AC
  Filled 2019-09-03: qty 2

## 2019-09-03 MED ORDER — APIXABAN 5 MG PO TABS
5.0000 mg | ORAL_TABLET | Freq: Two times a day (BID) | ORAL | Status: DC
  Administered 2019-09-03 – 2019-09-04 (×2): 5 mg via ORAL
  Filled 2019-09-03 (×2): qty 1

## 2019-09-03 MED ORDER — LABETALOL HCL 5 MG/ML IV SOLN: 10.0000 mg | INTRAVENOUS | Status: DC | PRN

## 2019-09-03 MED ORDER — VERAPAMIL HCL 2.5 MG/ML IV SOLN
INTRAVENOUS | Status: AC
  Filled 2019-09-03: qty 2

## 2019-09-03 MED ORDER — MAGNESIUM HYDROXIDE 400 MG/5ML PO SUSP
30.0000 mL | Freq: Every day | ORAL | Status: DC | PRN
  Administered 2019-09-03: 21:00:00 30 mL via ORAL
  Filled 2019-09-03: qty 30

## 2019-09-03 MED ORDER — LIDOCAINE HCL (PF) 1 % IJ SOLN
INTRAMUSCULAR | Status: AC
  Filled 2019-09-03: qty 30

## 2019-09-03 SURGICAL SUPPLY — 13 items
CATH BALLN WEDGE 5F 110CM (CATHETERS) ×1 IMPLANT
CATH INFINITI 5FR ANG PIGTAIL (CATHETERS) ×1 IMPLANT
CATH OPTITORQUE TIG 4.0 5F (CATHETERS) ×1 IMPLANT
GLIDESHEATH SLEND A-KIT 6F 22G (SHEATH) ×1 IMPLANT
GUIDEWIRE .025 260CM (WIRE) ×1 IMPLANT
GUIDEWIRE INQWIRE 1.5J.035X260 (WIRE) IMPLANT
INQWIRE 1.5J .035X260CM (WIRE) ×2
KIT HEART LEFT (KITS) ×2 IMPLANT
PACK CARDIAC CATHETERIZATION (CUSTOM PROCEDURE TRAY) ×2 IMPLANT
SHEATH GLIDE SLENDER 4/5FR (SHEATH) ×1 IMPLANT
TRANSDUCER W/STOPCOCK (MISCELLANEOUS) ×2 IMPLANT
TUBING CIL FLEX 10 FLL-RA (TUBING) ×2 IMPLANT
WIRE HI TORQ VERSACORE-J 145CM (WIRE) ×1 IMPLANT

## 2019-09-03 NOTE — Plan of Care (Signed)
  Problem: Education: Goal: Knowledge of General Education information will improve Description Including pain rating scale, medication(s)/side effects and non-pharmacologic comfort measures Outcome: Progressing   Problem: Health Behavior/Discharge Planning: Goal: Ability to manage health-related needs will improve Outcome: Progressing   

## 2019-09-03 NOTE — Progress Notes (Signed)
ANTICOAGULATION CONSULT NOTE - Follow Up Consult  Pharmacy Consult for heparin gtt  Indication: chest pain/ACS  Allergies  Allergen Reactions  . Amoxicillin     Makes eyes turn yellow and burn  . Ciprofloxacin     Pt not sure    Patient Measurements: Height: 5\' 6"  (167.6 cm) Weight: 147 lb 7.8 oz (66.9 kg) IBW/kg (Calculated) : 63.8 Heparin Dosing Weight: 66 kg   Vital Signs: Temp: 98.7 F (37.1 C) (01/25 0055) Temp Source: Oral (01/25 0055) BP: 86/54 (01/25 0100) Pulse Rate: 66 (01/25 0100)  Labs: Recent Labs    08/31/19 1220 08/31/19 1220 09/01/19 0610 09/02/19 0717 09/02/19 1035 09/02/19 1624 09/03/19 0239 09/03/19 0247  HGB 11.0*   < >  --  11.4*  --   --  11.5*  --   HCT 37.6*  --   --  37.7*  --   --  38.1*  --   PLT 272  --   --  278  --   --  277  --   APTT  --   --   --  36  --  36 55*  --   HEPARINUNFRC  --   --   --  0.52  --   --   --  0.26*  CREATININE 1.22   < > 1.36*  --  1.65*  --  1.79*  --    < > = values in this interval not displayed.    Estimated Creatinine Clearance: 30.2 mL/min (A) (by C-G formula based on SCr of 1.79 mg/dL (H)).  Assessment: 31 yom presented with chest pain initially thought to be due to CHF exacerbation but now cardiology considering cath intervention. Patient was transitioned off apixaban to heparin infusion and pharmacy has been consulted to dose heparin. Patient is on apixaban PTA and last dose noted on 1/23 at 9AM. aPTT/heparin level must correlate before transitioning to only heparin level monitoring.  Heparin level remains subtherapeutic (0.26), PTT 55 sec. Labs are not correlating so will just follow heparin levels now. No issues with line or bleeding reported per RN.  Goal of Therapy:  Heparin level 0.3-0.7 units/ml Monitor platelets by anticoagulation protocol: Yes   Plan:  Increase heparin infusion to 1250 units/hr F/u post cath  Sherlon Handing, PharmD, BCPS Please see amion for complete clinical  pharmacist phone list 09/03/2019       3:54 AM

## 2019-09-03 NOTE — Progress Notes (Signed)
ANTICOAGULATION CONSULT NOTE - Follow Up Consult  Pharmacy Consult for heparin gtt  Indication: chest pain/ACS  Allergies  Allergen Reactions  . Amoxicillin     Makes eyes turn yellow and burn  . Ciprofloxacin     Pt not sure    Patient Measurements: Height: 5\' 6"  (167.6 cm) Weight: 150 lb (68 kg) IBW/kg (Calculated) : 63.8 Heparin Dosing Weight: 66 kg   Vital Signs: Temp: 97.9 F (36.6 C) (01/25 1235) Temp Source: Oral (01/25 1235) BP: 96/54 (01/25 1235) Pulse Rate: 72 (01/25 1235)  Labs: Recent Labs    09/01/19 0610 09/02/19 0717 09/02/19 0717 09/02/19 1035 09/02/19 1624 09/03/19 0239 09/03/19 0239 09/03/19 0247 09/03/19 0924 09/03/19 0928  HGB  --  11.4*   < >  --   --  11.5*   < >  --  11.9*  11.9* 11.9*  HCT  --  37.7*   < >  --   --  38.1*  --   --  35.0*  35.0* 35.0*  PLT  --  278  --   --   --  277  --   --   --   --   APTT  --  36  --   --  36 55*  --   --   --   --   HEPARINUNFRC  --  0.52  --   --   --   --   --  0.26*  --   --   CREATININE 1.36*  --   --  1.65*  --  1.79*  --   --   --   --    < > = values in this interval not displayed.    Estimated Creatinine Clearance: 30.2 mL/min (A) (by C-G formula based on SCr of 1.79 mg/dL (H)).  Assessment: 46 yom presented with chest pain initially thought to be due to CHF exacerbation but now cardiology considering cath intervention. Patient was transitioned off apixaban to heparin infusion and pharmacy has been consulted to dose heparin. Patient is on apixaban PTA and last dose noted on 1/23 at 9AM. aPTT/heparin level must correlate before transitioning to only heparin level monitoring.  Cardiac cath showing normal coronary arteries and low filling pressures. Plan to switch back to apixaban. PTA dose is 5 mg BID for hx DVT. No s/sx of bleeding. Hgb 11.5, plt 277.   Goal of Therapy:  Monitor platelets by anticoagulation protocol: Yes   Plan:  Discontinue heparin infusion Order apixaban 5 mg twice  daily  Monitor CBC, for s/sx of bleeding  Antonietta Jewel, PharmD, BCCCP Clinical Pharmacist  Phone: [phone]  Please check AMION for all [place] phone numbers After 10:00 PM, call [place] [phone] 09/03/2019       3:18 PM

## 2019-09-03 NOTE — Evaluation (Signed)
Physical Therapy Evaluation Patient Details Name: Gregory Mitchell MRN: [id number] DOB: 09/30/1940 Today's Date: 09/03/2019   History of Present Illness  Patient is a 79 y/o male who presents with chest pressure and SOB. CXR-pulmonary edema and LLL consistent with PNA. Admitted with acute on chronic CHF. s/p cardiac cath 1/25. PMH includes DM, HTN, COPD, renal insufficiency.  Clinical Impression  Patient presents with mild balance deficits and generalized weakness s/p above. Pt independent PTA and helps care for wife. Today, pt tolerated bed mobility, transfers and gait training with supervision-Min guard for safety. Noted to have some imbalance during ambulation but no overt LOB. This will likely improve with increased activity. VSS throughout with SP02 90% on RA, 2/4 DOE and BP 108/55. Will follow acutely to maximize independence and mobility prior to return home.    Follow Up Recommendations No PT follow up;Supervision - Intermittent    Equipment Recommendations  None recommended by PT    Recommendations for Other Services       Precautions / Restrictions Precautions Precautions: Fall Restrictions Weight Bearing Restrictions: No      Mobility  Bed Mobility Overal bed mobility: Needs Assistance Bed Mobility: Supine to Sit     Supine to sit: Supervision;HOB elevated     General bed mobility comments: No assist needed, no use of rail.  Transfers Overall transfer level: Needs assistance Equipment used: None Transfers: Sit to/from Stand Sit to Stand: Supervision         General transfer comment: Supervision for safety. Stood from [employer], transferred to chair post ambulation.  Ambulation/Gait Ambulation/Gait assistance: Min guard Gait Distance (Feet): 220 Feet Assistive device: None Gait Pattern/deviations: Step-through pattern;Decreased stride length;Staggering right;Staggering left;Drifts right/left   Gait velocity interpretation: 1.31 - 2.62 ft/sec, indicative of  limited community ambulator General Gait Details: Slow, mildly unsteady gait with drifting to right/left with 1 instance of scissoring but no overt LOB. Reports arthritis in BLEs- stiff from being in bed. Sp02 90% BP 108/55 post activity. 2/4 DOE.  Stairs            Wheelchair Mobility    Modified Rankin (Stroke Patients Only)       Balance Overall balance assessment: Needs assistance Sitting-balance support: Feet supported;No upper extremity supported Sitting balance-Leahy Scale: Good     Standing balance support: During functional activity Standing balance-Leahy Scale: Fair Standing balance comment: Close min guard for safety due to imbalance at times.                             Pertinent Vitals/Pain Pain Assessment: Faces Faces Pain Scale: Hurts little more Pain Location: BLEs, arthritis Pain Descriptors / Indicators: Aching Pain Intervention(s): Monitored during session;Repositioned    Home Living Family/patient expects to be discharged to:: Private residence Living Arrangements: Spouse/significant other;Children Available Help at Discharge: Family;Available 24 hours/day Type of Home: House Home Access: Level entry     Home Layout: One level Home Equipment: Shower seat      Prior Function Level of Independence: Independent         Comments: Drives, cooks. Helps wife with ADLs as needed.     Hand Dominance   Dominant Hand: Right    Extremity/Trunk Assessment   Upper Extremity Assessment Upper Extremity Assessment: Defer to OT evaluation    Lower Extremity Assessment Lower Extremity Assessment: Generalized weakness(from being in bed but functional)       Communication   Communication: HOH  Cognition Arousal/Alertness:  Awake/alert Behavior During Therapy: WFL for tasks assessed/performed Overall Cognitive Status: Within Functional Limits for tasks assessed                                 General Comments: for  basic mobility tasks.      General Comments General comments (skin integrity, edema, etc.): VSS.    Exercises     Assessment/Plan    PT Assessment Patient needs continued PT services  PT Problem List Decreased strength;Decreased mobility;Pain;Decreased balance;Decreased knowledge of use of DME;Decreased activity tolerance;Cardiopulmonary status limiting activity       PT Treatment Interventions Therapeutic activities;Gait training;Therapeutic exercise;Patient/family education;Balance training;Functional mobility training    PT Goals (Current goals can be found in the Care Plan section)  Acute Rehab PT Goals Patient Stated Goal: to go home PT Goal Formulation: With patient Time For Goal Achievement: 09/17/19 Potential to Achieve Goals: Good    Frequency Min 3X/week   Barriers to discharge        Co-evaluation               AM-PAC PT "6 Clicks" Mobility  Outcome Measure Help needed turning from your back to your side while in a flat bed without using bedrails?: None Help needed moving from lying on your back to sitting on the side of a flat bed without using bedrails?: None Help needed moving to and from a bed to a chair (including a wheelchair)?: None Help needed standing up from a chair using your arms (e.g., wheelchair or bedside chair)?: None Help needed to walk in hospital room?: A Little Help needed climbing 3-5 steps with a railing? : A Little 6 Click Score: 22    End of Session Equipment Utilized During Treatment: Gait belt Activity Tolerance: Patient tolerated treatment well Patient left: in chair;with call bell/phone within reach;with nursing/sitter in room Nurse Communication: Mobility status PT Visit Diagnosis: Pain;Unsteadiness on feet (R26.81);Other abnormalities of gait and mobility (R26.89) Pain - Right/Left: (bil) Pain - part of body: Leg    Time: [id number] PT Time Calculation (min) (ACUTE ONLY): 18 min   Charges:   PT Evaluation $PT Eval  Moderate Complexity: 1 Mod          Gregory Mitchell, PT, DPT Acute Rehabilitation Services Pager [phone] Office [phone]      Gregory Mitchell 09/03/2019, 3:50 PM

## 2019-09-03 NOTE — Progress Notes (Signed)
OT Cancellation Note  Patient Details Name: Gregory Mitchell MRN: [id number] DOB: 06-28-1941   Cancelled Treatment:    Reason Eval/Treat Not Completed: Patient at procedure or test/ unavailable(pt in cath lab)  Malka So 09/03/2019, 8:56 AM  Nestor Lewandowsky, OTR/L Acute Rehabilitation Services Pager: [phone] Office: [phone]

## 2019-09-03 NOTE — Progress Notes (Signed)
Pt's BP is very low. Denies any symptoms. Fluid infusing per pre-cath order. The on-call provider has been notified, no new orders are obtained.

## 2019-09-03 NOTE — Progress Notes (Addendum)
Cardiology Progress Note  Patient ID: Gregory Mitchell Gregory Mitchell Gregory Mitchell Date of Encounter: 09/03/2019  Primary Cardiologist: Gregory Dolly, MD  Subjective  Left heart cath with normal coronaries.  LVEDP 15 mmHg.  Volume depletion likely.  Cardiac output 5.4 L/min, cardiac index 3.0 L/min/m.  Reports he is feeling well this morning.  ROS:  All other ROS reviewed and negative. Pertinent positives noted in the HPI.     Inpatient Medications  Scheduled Meds: . [START ON 09/04/2019] aspirin  81 mg Oral Daily  . insulin aspart  0-5 Units Subcutaneous QHS  . insulin aspart  0-9 Units Subcutaneous TID WC  . montelukast  10 mg Oral QHS  . sodium chloride flush  3 mL Intravenous Q12H  . sodium chloride flush  3 mL Intravenous Q12H  . tamsulosin  0.4 mg Oral QPC supper   Continuous Infusions: . sodium chloride    . sodium chloride    . sodium chloride     PRN Meds: sodium chloride, sodium chloride, acetaminophen, alum & mag hydroxide-simeth, hydrALAZINE, ipratropium-albuterol, labetalol, morphine injection, ondansetron (ZOFRAN) IV, sodium chloride flush, sodium chloride flush   Vital Signs   Vitals:   09/03/19 0934 09/03/19 0939 09/03/19 0944 09/03/19 1006  BP: 98/61 96/64 102/68 93/64  Pulse: 68 70 (!) 0 70  Resp: 12 (!) 28 16 18   Temp:    98.4 F (36.9 C)  TempSrc:    Oral  SpO2: 99% 97% (!) 0% 97%  Weight:      Height:        Intake/Output Summary (Last 24 hours) at 09/03/2019 1018 Last data filed at 09/03/2019 0553 Gross per 24 hour  Intake 757.61 ml  Output 525 ml  Net 232.61 ml   Last 3 Weights 09/03/2019 09/02/2019 09/01/2019  Weight (lbs) 150 lb 147 lb 7.8 oz 145 lb 11.2 oz  Weight (kg) 68.04 kg 66.9 kg 66.089 kg      Telemetry  Overnight telemetry shows sinus rhythm heart rate 70s, which I personally reviewed.   ECG  The most recent ECG shows normal sinus rhythm, right bundle branch block, left anterior fascicular block, first AV block consistent  with trifascicular block, which I personally reviewed.   Physical Exam   Vitals:   09/03/19 0934 09/03/19 0939 09/03/19 0944 09/03/19 1006  BP: 98/61 96/64 102/68 93/64  Pulse: 68 70 (!) 0 70  Resp: 12 (!) 28 16 18   Temp:    98.4 F (36.9 C)  TempSrc:    Oral  SpO2: 99% 97% (!) 0% 97%  Weight:      Height:         Intake/Output Summary (Last 24 hours) at 09/03/2019 1018 Last data filed at 09/03/2019 0553 Gross per 24 hour  Intake 757.61 ml  Output 525 ml  Net 232.61 ml    Last 3 Weights 09/03/2019 09/02/2019 09/01/2019  Weight (lbs) 150 lb 147 lb 7.8 oz 145 lb 11.2 oz  Weight (kg) 68.04 kg 66.9 kg 66.089 kg    Body mass index is 24.21 kg/m.  General: Well nourished, well developed, in no acute distress Head: Atraumatic, normal size  Eyes: PEERLA, EOMI  Neck: Supple, no JVD Endocrine: No thryomegaly Cardiac: Normal S1, S2; RRR; no murmurs, rubs, or gallops Lungs: Scattered rhonchi bilaterally Abd: Soft, nontender, no hepatomegaly  Ext: No edema, pulses 2+ Musculoskeletal: No deformities, BUE and BLE strength normal and equal Skin: Warm and dry, no rashes   Neuro: Alert and oriented to person,  place, time, and situation, CNII-XII grossly intact, no focal deficits  Psych: Normal mood and affect   Labs  High Sensitivity Troponin:   Recent Labs  Lab 08/31/19 1220 08/31/19 1414  TROPONINIHS 39* 40*     Cardiac EnzymesNo results for input(s): TROPONINI in the last 168 hours. No results for input(s): TROPIPOC in the last 168 hours.  Chemistry Recent Labs  Lab 08/31/19 1220 08/31/19 1220 09/01/19 0610 09/02/19 1035 09/03/19 0239  NA 139   < > 137 139 137  K 4.0   < > 3.8 4.1 4.4  CL 103   < > 99 101 102  CO2 27   < > 29 29 25   GLUCOSE 80   < > 88 120* 97  BUN 10   < > 12 19 26*  CREATININE 1.22   < > 1.36* 1.65* 1.79*  CALCIUM 8.7*   < > 8.8* 8.8* 8.7*  PROT 6.7  --   --   --  6.0*  ALBUMIN 3.4*  --   --   --  3.0*  AST 22  --   --   --  18  ALT 14  --   --    --  11  ALKPHOS 34*  --   --   --  42  BILITOT 0.7  --   --   --  0.5  GFRNONAA 56*   < > 49* 39* 35*  GFRAA >60   < > 57* 45* 41*  ANIONGAP 9   < > 9 9 10    < > = values in this interval not displayed.    Hematology Recent Labs  Lab 08/31/19 1220 09/02/19 0717 09/03/19 0239  WBC 3.9* 5.4 6.0  RBC 4.12* 4.27 4.28  HGB 11.0* 11.4* 11.5*  HCT 37.6* 37.7* 38.1*  MCV 91.3 88.3 89.0  MCH 26.7 26.7 26.9  MCHC 29.3* 30.2 30.2  RDW 13.7 13.6 13.6  PLT 272 278 277   BNP Recent Labs  Lab 08/31/19 1220  BNP 778.0*    DDimer No results for input(s): DDIMER in the last 168 hours.   Radiology  DG Chest 2 View  Result Date: 09/01/2019 CLINICAL DATA:  Shortness of breath and chest pain for several days, history asthma, hypertension, diabetes mellitus, negative for COVID-19 on 01/22 EXAM: CHEST - 2 VIEW COMPARISON:  08/31/2019 FINDINGS: Minimal enlargement of cardiac silhouette. Mediastinal contours and pulmonary vascularity normal. LEFT lower lobe consolidation consistent with pneumonia. Remaining lungs minimally hyperinflated but clear. No pleural effusion or pneumothorax. IMPRESSION: Minimal enlargement of cardiac silhouette. LEFT lower lobe infiltrate consistent with pneumonia. Electronically Signed   By: Gregory Mitchell M.D.   On: 09/01/2019 13:49   CARDIAC CATHETERIZATION  Result Date: 09/03/2019 Gregory Mitchell is a 79 y.o. male  Gregory Mitchell LOCATION:  FACILITY: [facility] PHYSICIAN: Gregory Mitchell, M.D. March 14, 1941 DATE OF PROCEDURE:  09/03/2019 DATE OF DISCHARGE: CARDIAC CATHETERIZATION History obtained from chart review.Gregory Mitchell is a 79 y.o. male with a hx of longstanding chest pain with nonobstructive coronary artery disease by cardiac catheterization in 0000000, chronic systolic heart failure with evidence of improved LV function in the past (secondary to nonischemic cardiomyopathy), COPD, hypertension, hyperlipidemia, prior pulmonary embolism, chronic kidney disease and diabetes who is being  seen today for the evaluation of decompensated heart failure in the setting of reduced LV function and elevated troponin at the request of Dr. Dyann Mitchell.   Gregory Mitchell has normal coronary arteries and low filling pressures suggesting that he  has been adequately or over diuresed.  He did have a high V waves suggesting that he potentially has significant mitral regurgitation.  His systolic pressures were in the mid to high 90s and I therefore gave him a 250 cc bolus of saline given his low LVEDP and wedge.  He will need guideline directed optimal medical therapy for his LV dysfunction.  The sheath removed and a TR band was placed on the right wrist to achieve patent hemostasis.  The patient left lab in stable condition.  His renal function be carefully monitored.  Dr. Davina Poke was notified of these results. Gregory Mitchell. MD, [facility] 09/03/2019 9:55 AM    Cardiac Studies   TTE 08/31/2019  1. Left ventricular ejection fraction, by visual estimation, is 25 to 30%. The left ventricle has severely decreased function. There is no left ventricular hypertrophy.  2. Elevated left ventricular end-diastolic pressure.  3. Left ventricular diastolic parameters are consistent with Grade II diastolic dysfunction (pseudonormalization).  4. Mild to moderately dilated left ventricular internal cavity size.  5. The left ventricle demonstrates global hypokinesis.  6. Global right ventricle has normal systolic function.The right ventricular size is normal. Mildly increased right ventricular wall thickness.  7. Left atrial size was severely dilated.  8. Right atrial size was moderately dilated.  9. The mitral valve is grossly normal. Mild mitral valve regurgitation. 10. The tricuspid valve is grossly normal. 11. The tricuspid valve is grossly normal. Tricuspid valve regurgitation is mild. 12. The aortic valve is tricuspid. Aortic valve regurgitation is not visualized. No evidence of aortic valve sclerosis or stenosis. 13. The  pulmonic valve was grossly normal. Pulmonic valve regurgitation is not visualized. 14. The inferior vena cava is normal in size with greater than 50% respiratory variability, suggesting right atrial pressure of 3 mmHg.  Patient Profile  Gregory Mitchell is a 79 y.o. male with history of COPD, systolic heart failure with recovery of ejection fraction (35 -> 55%), hypertension, CKD, prior pulmonary malaise him who was admitted on 08/31/2019 to any pain hospital for shortness of breath concerning for acute decompensated systolic heart failure.  He was found to have newly reduced ejection fraction back down to 25%.  Cardiac catheterization shows normal coronary arteries and likely volume depletion.  Assessment & Plan   1.  Acute systolic heart failure, ejection fraction 25%, with volume depletion -Initially admitted on 08/31/2019 with decompensated heart failure.  Did receive aggressive diuresis.  Urine output did decline in his creatinine rise. -Troponin elevation is just demand. -He is volume down with LVEDP around 15 mmHg -He has preserved hemodynamics -We will give him gentle fluid back.  He is received 250 cc in the Cath Lab.  Then encourage good p.o. intake. -Due to his soft Bps, we will hold medications today. -We will need to get him on a beta-blocker, ACE/ARB (pending kidney function) -Hold diuretic therapy today  2.  Trifascicular block -EKG with right bundle branch block, left anterior fascicular block and prolonged PR interval up to 306 ms consistent with trifascicular block.  A beta-blocker could be problematic.  We will have to see how he does.  If he is unable to tolerate this, he may end up needing a pacemaker.  For now we can see how he tolerates a beta-blocker. -hold AC as he may need ppm this admission. Will discuss with EP.   For questions or updates, please contact [place] Please consult [email or web address] for contact info under   Signed, [place] T. Audie Box, MD  [facility]  09/03/2019 10:18 AM

## 2019-09-03 NOTE — Interval H&P Note (Signed)
Cath Lab Visit (complete for each Cath Lab visit)  Clinical Evaluation Leading to the Procedure:   ACS: No.  Non-ACS:    Anginal Classification: CCS II  Anti-ischemic medical therapy: No Therapy  Non-Invasive Test Results: No non-invasive testing performed  Prior CABG: No previous CABG      History and Physical Interval Note:  09/03/2019 8:54 AM  Gregory Mitchell  has presented today for surgery, with the diagnosis of NSTEMI.  The various methods of treatment have been discussed with the patient and family. After consideration of risks, benefits and other options for treatment, the patient has consented to  Procedure(s): RIGHT/LEFT HEART CATH AND CORONARY ANGIOGRAPHY (N/A) as a surgical intervention.  The patient's history has been reviewed, patient examined, no change in status, stable for surgery.  I have reviewed the patient's chart and labs.  Questions were answered to the patient's satisfaction.     Quay Burow

## 2019-09-03 NOTE — Progress Notes (Signed)
PROGRESS NOTE    Gregory Mitchell  [id number] DOB: 11-30-1940 DOA: 08/31/2019 PCP: Neale Burly, MD    Brief Narrative:  79 year old male who presented with dyspnea, orthopnea and chest pressure.  He does have the significant past medical history for asthma/COPD, with chronic hypoxic respiratory failure, hypertension, type 2 diabetes mellitus, stage IIIa chronic kidney disease and chronic diastolic heart failure.  Patient reported 4 to 5 days of worsening dyspnea and chest pressure, associated with orthopnea.  On his initial physical examination his blood pressure was 134/78, heart rate 85, respiratory rate 29, oxygen saturation 99%, his lungs had bibasilar Rales, no wheezing, heart S1-S2 present, rhythmic, no gallops rubs or murmurs, abdomen was soft, no significant lower extremity edema. Sodium 139, potassium 4.0, chloride 103, bicarb 27, glucose 80, BUN 10, creatinine 1.2, white count 3.9, hemoglobin 11.0, hematocrit 37.6, platelets 272.  SARS COVID-19 was negative.  Chest radiograph had congested hilar regions bilaterally, right base atelectasis.  EKG 81 bpm, left axis deviation, right bundle branch block, prolonged QTC 523, first degree AV block, sinus rhythm, no ST segment changes, negative T waves in lead I and aVL.  Patient was admitted to the hospital with working diagnosis of acute decompensation of chronic diastolic heart failure.  Assessment & Plan:   Principal Problem:   Acute on chronic diastolic CHF (congestive heart failure) (HCC) Active Problems:   Chronic renal insufficiency   Type 2 diabetes mellitus (HCC)   HTN (hypertension)   COPD (chronic obstructive pulmonary disease) (HCC)   Acute on chronic respiratory failure with hypoxia (HCC)   1. Acute on chronic heart failure with worsening LV systolic function. Per old records LV EF 35 to 40% in 2013, improved to 55 to 60% by 2017, but on this admission has dropped to 25 to 30%. Transferred [facility] to [facility] for further work  up with cardiac catheterization right and left.  Follow up cath report with no significant coronary artery disease, and low filling pressures. Recommendation for continue medical management after gentle hydration with saline. Continue aspirin and low dose b blockade with carvedilol.   2. Trifascicular block. First degree, left anterior fascicular block and right bundle branch block with prolonged Qtc. Will continue carvedilol with close telemetry monitoring.  3. T2DM (Hgb A1c 6,1). Will continue glucose cover and monitoring with insulin sliding scale.  4. HTN. Will continue carvedilol as tolerated.   5. AKI on  CKD stage 3a.  Base cr at 1,36, today's serum cr at 1,79 with K at 4,4 and serum bicarbonate at 25, will follow on renal panel in am. Avoid nephrotoxic medications, holding diuresis. Getting bolus saline for over-diuresis.    6. COPD and hx of PE. Continue anticoagulation with apixaban. Continue bronchodilators and montelukast.    DVT prophylaxis:   Code Status:  full Family Communication: no family at the bedside  Disposition Plan/ discharge barriers: plan for possible dc in am,    Consultants:   Cardiology     Subjective: Patient is feeling better, dyspnea has improved, no nausea or vomiting, no chest pain.   Objective: Vitals:   09/02/19 2139 09/03/19 0055 09/03/19 0100 09/03/19 0532  BP: (!) 106/59 (!) 96/52 (!) 86/54 (!) 88/54  Pulse: 73 72 66 68  Resp:  18    Temp:  98.7 F (37.1 C)  98.7 F (37.1 C)  TempSrc:  Oral  Oral  SpO2:  100%  100%  Weight:    68 kg  Height:  Intake/Output Summary (Last 24 hours) at 09/03/2019 0841 Last data filed at 09/03/2019 0553 Gross per 24 hour  Intake 1035.39 ml  Output 725 ml  Net 310.39 ml   Filed Weights   09/01/19 2029 09/02/19 0546 09/03/19 0532  Weight: 66.1 kg 66.9 kg 68 kg    Examination:   General: Not in pain or dyspnea Neurology: Awake and alert, non focal  E ENT: no pallor, no icterus, oral  mucosa moist Cardiovascular: No JVD. S1-S2 present, rhythmic, no gallops, rubs, or murmurs. No lower extremity edema. Pulmonary: positive breath sounds bilaterally, adequate air movement, no wheezing,or rhonchi mild bilateral rales. Gastrointestinal. Abdomen with no organomegaly, non tender, no rebound or guarding Skin. No rashes Musculoskeletal: no joint deformities     Data Reviewed: I have personally reviewed following labs and imaging studies  CBC: Recent Labs  Lab 08/31/19 1220 09/02/19 0717 09/03/19 0239  WBC 3.9* 5.4 6.0  NEUTROABS 2.0  --   --   HGB 11.0* 11.4* 11.5*  HCT 37.6* 37.7* 38.1*  MCV 91.3 88.3 89.0  PLT 272 278 [id number]   Basic Metabolic Panel: Recent Labs  Lab 08/31/19 1220 09/01/19 0610 09/02/19 1035 09/03/19 0239  NA 139 137 139 137  K 4.0 3.8 4.1 4.4  CL 103 99 101 102  CO2 27 29 29 25   GLUCOSE 80 88 120* 97  BUN 10 12 19  26*  CREATININE 1.22 1.36* 1.65* 1.79*  CALCIUM 8.7* 8.8* 8.8* 8.7*  MG 1.8  --   --   --    GFR: Estimated Creatinine Clearance: 30.2 mL/min (A) (by C-G formula based on SCr of 1.79 mg/dL (H)). Liver Function Tests: Recent Labs  Lab 08/31/19 1220 09/03/19 0239  AST 22 18  ALT 14 11  ALKPHOS 34* 42  BILITOT 0.7 0.5  PROT 6.7 6.0*  ALBUMIN 3.4* 3.0*   No results for input(s): LIPASE, AMYLASE in the last 168 hours. No results for input(s): AMMONIA in the last 168 hours. Coagulation Profile: No results for input(s): INR, PROTIME in the last 168 hours. Cardiac Enzymes: No results for input(s): CKTOTAL, CKMB, CKMBINDEX, TROPONINI in the last 168 hours. BNP (last 3 results) No results for input(s): PROBNP in the last 8760 hours. HbA1C: Recent Labs    08/31/19 1220  HGBA1C 6.1*   CBG: Recent Labs  Lab 09/02/19 0616 09/02/19 1134 09/02/19 1623 09/02/19 2057 09/03/19 0609  GLUCAP 91 80 81 113* 88   Lipid Profile: No results for input(s): CHOL, HDL, LDLCALC, TRIG, CHOLHDL, LDLDIRECT in the last 72  hours. Thyroid Function Tests: Recent Labs    08/31/19 1220  TSH 1.017   Anemia Panel: No results for input(s): VITAMINB12, FOLATE, FERRITIN, TIBC, IRON, RETICCTPCT in the last 72 hours.    Radiology Studies: I have reviewed all of the imaging during this hospital visit personally     Scheduled Meds: . insulin aspart  0-5 Units Subcutaneous QHS  . insulin aspart  0-9 Units Subcutaneous TID WC  . montelukast  10 mg Oral QHS  . sodium chloride flush  3 mL Intravenous Q12H  . sodium chloride flush  3 mL Intravenous Q12H  . tamsulosin  0.4 mg Oral QPC supper   Continuous Infusions: . sodium chloride    . sodium chloride    . sodium chloride 50 mL/hr at 09/03/19 0445  . heparin 1,250 Units/hr (09/03/19 0359)     LOS: 3 days        Ahlijah Raia Gerome Apley, MD

## 2019-09-04 DIAGNOSIS — N179 Acute kidney failure, unspecified: Secondary | ICD-10-CM

## 2019-09-04 DIAGNOSIS — I453 Trifascicular block: Secondary | ICD-10-CM

## 2019-09-04 DIAGNOSIS — I5023 Acute on chronic systolic (congestive) heart failure: Secondary | ICD-10-CM

## 2019-09-04 LAB — BASIC METABOLIC PANEL WITH GFR
Anion gap: 8 (ref 5–15)
BUN: 29 mg/dL — ABNORMAL HIGH (ref 8–23)
CO2: 29 mmol/L (ref 22–32)
Calcium: 8.6 mg/dL — ABNORMAL LOW (ref 8.9–10.3)
Chloride: 104 mmol/L (ref 98–111)
Creatinine, Ser: 1.8 mg/dL — ABNORMAL HIGH (ref 0.61–1.24)
GFR calc Af Amer: 41 mL/min — ABNORMAL LOW
GFR calc non Af Amer: 35 mL/min — ABNORMAL LOW
Glucose, Bld: 104 mg/dL — ABNORMAL HIGH (ref 70–99)
Potassium: 4.6 mmol/L (ref 3.5–5.1)
Sodium: 141 mmol/L (ref 135–145)

## 2019-09-04 LAB — GLUCOSE, CAPILLARY
Glucose-Capillary: 90 mg/dL (ref 70–99)
Glucose-Capillary: 66 mg/dL — ABNORMAL LOW (ref 70–99)
Glucose-Capillary: 87 mg/dL (ref 70–99)

## 2019-09-04 MED ORDER — CARVEDILOL 3.125 MG PO TABS: 3.1250 mg | ORAL_TABLET | Freq: Two times a day (BID) | ORAL | 0 refills | Status: DC

## 2019-09-04 MED ORDER — SENNA 8.6 MG PO TABS
1.0000 | ORAL_TABLET | Freq: Every day | ORAL | Status: AC
  Administered 2019-09-04: 06:00:00 8.6 mg via ORAL
  Filled 2019-09-04: qty 1

## 2019-09-04 MED ORDER — FUROSEMIDE 20 MG PO TABS: 20.0000 mg | ORAL_TABLET | Freq: Every day | ORAL | 0 refills | Status: DC | PRN

## 2019-09-04 MED ORDER — SODIUM CHLORIDE 0.9 % IV SOLN: INTRAVENOUS | Status: AC

## 2019-09-04 MED ORDER — ISOSORBIDE MONONITRATE ER 30 MG PO TB24: 15.0000 mg | ORAL_TABLET | Freq: Every day | ORAL | 0 refills | Status: DC

## 2019-09-04 MED ORDER — HYDRALAZINE HCL 10 MG PO TABS: 10.0000 mg | ORAL_TABLET | Freq: Three times a day (TID) | ORAL | 0 refills | Status: DC

## 2019-09-04 MED ORDER — HYDRALAZINE HCL 10 MG PO TABS
10.0000 mg | ORAL_TABLET | Freq: Three times a day (TID) | ORAL | Status: DC
  Administered 2019-09-04: 09:00:00 10 mg via ORAL
  Filled 2019-09-04: qty 1

## 2019-09-04 MED ORDER — ISOSORBIDE MONONITRATE ER 30 MG PO TB24
15.0000 mg | ORAL_TABLET | Freq: Every day | ORAL | Status: DC
  Administered 2019-09-04: 09:00:00 15 mg via ORAL
  Filled 2019-09-04: qty 1

## 2019-09-04 NOTE — TOC Transition Note (Signed)
Transition of Care [facility]) - CM/SW Discharge Note   Patient Details  Name: Gregory Mitchell MRN: [id number] Date of Birth: February 17, 1941  [facility] CM/SW Contact:  Zenon Mayo, RN Phone Number: 09/04/2019, 1:07 PM   Clinical Narrative:    Patient for dc home today, NCM offered choice he states he does not want [place] he would like outpatient HHPT,  NCM informed MD , he wrote script for HHPT.  NCM faxed script to [facility] and rehab [phone] and phone is [phone].     Final next level of care: Home/Self Care Barriers to Discharge: No Barriers Identified   Patient Goals and CMS Choice        Discharge Placement                       Discharge Plan and Services                DME Arranged: (NA)         HH Arranged: NA          Social Determinants of Health (SDOH) Interventions     Readmission Risk Interventions No flowsheet data found.

## 2019-09-04 NOTE — Discharge Summary (Signed)
Physician Discharge Summary  Gregory Mitchell [id number] DOB: 1941/06/10 DOA: 08/31/2019  PCP: Neale Burly, MD  Admit date: 08/31/2019 Discharge date: 09/04/2019  Admitted From: Home  Disposition:  Home   Recommendations for Outpatient Follow-up and new medication changes:  1. Follow up with Dr. Sherrie Sport in 7 days.  2. Patient has been placed on low dose carvedilol 3. Started on hydralazine and isosorbide for afterload reduction. 4. As needed furosemide 20 mg, for lower extremity edema or weight gain 3 lbs in 24 hours or 5 lbs in 7 days.  5. Hold on arb or ace inh until renal function stable.  6. Follow up renal function in 7 days.   Home Health: no   Equipment/Devices: no    Discharge Condition: stable CODE STATUS: full  Diet recommendation: heart healthy and diabetic prudent.   Brief/Interim Summary: 79 year old male who presented with dyspnea, orthopnea and chest pressure.  He does have the significant past medical history for asthma/COPD, with chronic hypoxic respiratory failure, hypertension, type 2 diabetes mellitus, stage IIIa chronic kidney disease and chronic heart failure.  Patient reported 4 to 5 days of worsening dyspnea and chest pressure, associated with orthopnea.  On his initial physical examination his blood pressure was 134/78, heart rate 85, respiratory rate 29, oxygen saturation 99%, his lungs had bibasilar rales, no wheezing, heart S1-S2 present, rhythmic, no gallops rubs or murmurs, abdomen was soft, no significant lower extremity edema. Sodium 139, potassium 4.0, chloride 103, bicarb 27, glucose 80, BUN 10, creatinine 1.2, white count 3.9, hemoglobin 11.0, hematocrit 37.6, platelets 272.  SARS COVID-19 was negative.  Chest radiograph had congested hilar regions bilaterally, right base atelectasis.  EKG 81 bpm, left axis deviation, left anterior fascicular block, right bundle branch block, prolonged QTC 523, first degree AV block, sinus rhythm, no ST segment changes,  negative T waves in lead I and aVL.  Patient was admitted to the hospital with working diagnosis of acute decompensation of chronic heart failure.  Patient underwent aggressive diuresis with furosemide, with good response, further work up revealed a reduction in his LV systolic function and was transferred to [facility] for right and left cardiac cath. Normal coronaries and adequate diuresis.   1.  Acute on chronic systolic heart failure (EF 25 to 30%)/ acute on chronic hypoxic respiratory failure.  Patient was admitted to the telemetry unit, he was diuresed with intravenous furosemide, negative fluid balance was achieved with significant improvement of his symptoms.  Further work-up with echocardiography showed a decrease in his systolic LV function down to 25 to 30%, the left ventricle showed global hypokinesis.  His last known ejection fraction from 2017 was 55 to 60%, back in 2013 was 35 to 40%.  Consider the reduction in his systolic left ventricle function patient was transferred to [facility] for further work-up with left heart cardiac catheterization.  His coronaries were shown to be normal, patient had a low left ventricle end-diastolic pressure and wedge consistent with possible overdiuresis.  Patient received bolus normal saline with good toleration.  Patient was placed on carvedilol with good toleration, for now we will continue to hold losartan due to risk of hypotension, follow-up as an outpatient.  Patient has been started on hydralazine and isosorbide for after load reduction. Close follow up on blood pressure as outpatient.   2.  Trifascicular block.  Cardiogram showed first-degree AV block, left anterior fascicular block and a right bundle branch block.  His QTC also was not prolonged 523 ms.  Patient  has tolerated well carvedilol.  Follow-up as an outpatient.  3.  Acute kidney injury on chronic kidney disease stage IIIa.  Patient's kidney function has remained stable over the last 48  hours at 1.79-1.80 serum creatinine.  Potassium 4.6, for now we will continue to hold losartan, will use diuretic only as needed.  Will need close follow-up as an outpatient.  4.  Type 2 diabetes mellitus, hemoglobin A1c 6.1.  Patient received insulin sliding scale for glucose coverage monitoring.  5.  Hypertension.  Blood pressure has remained low normal, hold olmesartan for now.  Follow-up as an outpatient.  6.  COPD.  No signs of acute exacerbation. Continue bronchodilator and home 02.   7.  History of pulmonary embolism.  Apixaban was held for invasive cardiac work-up.  At discharge anticoagulation has been resumed.  Discharge Diagnoses:  Principal Problem:   Acute on chronic respiratory failure with hypoxia (HCC) Active Problems:   Chronic renal insufficiency   Type 2 diabetes mellitus (HCC)   HTN (hypertension)   COPD (chronic obstructive pulmonary disease) (HCC)   AKI (acute kidney injury) (HCC)   Acute on chronic systolic CHF (congestive heart failure) ([place])   Trifascicular block    Discharge Instructions   Allergies as of 09/04/2019      Reactions   Amoxicillin    Makes eyes turn yellow and burn   Ciprofloxacin    Pt not sure      Medication List    STOP taking these medications   losartan 50 MG tablet Commonly known as: COZAAR   methylPREDNISolone 4 MG Tbpk tablet Commonly known as: MEDROL DOSEPAK     TAKE these medications   apixaban 5 MG Tabs tablet Commonly known as: ELIQUIS Take 5 mg by mouth 2 (two) times daily.   carvedilol 3.125 MG tablet Commonly known as: COREG Take 1 tablet (3.125 mg total) by mouth 2 (two) times daily with a meal.   Fish Oil 1000 MG Caps Take 1 capsule by mouth 2 (two) times daily.   furosemide 20 MG tablet Commonly known as: Lasix Take 1 tablet (20 mg total) by mouth daily as needed for fluid or edema (Take in case of leg swelling or weight gain 3 lbs in 24 hours or 5 lbs in 7 days.).   hydrALAZINE 10 MG  tablet Commonly known as: APRESOLINE Take 1 tablet (10 mg total) by mouth every 8 (eight) hours.   ipratropium-albuterol 0.5-2.5 (3) MG/3ML Soln Commonly known as: DUONEB Take 3 mLs by nebulization every 6 (six) hours as needed (for shortness of breath/wheezing).   isosorbide mononitrate 30 MG 24 hr tablet Commonly known as: IMDUR Take 0.5 tablets (15 mg total) by mouth daily. Start taking on: September 05, 2019   metFORMIN 500 MG 24 hr tablet Commonly known as: GLUCOPHAGE-XR Take 1,000 mg by mouth 2 (two) times daily.   OXYGEN Inhale 2 L into the lungs at bedtime as needed (for shortness of breath).   ProAir HFA 108 (90 Base) MCG/ACT inhaler Generic drug: albuterol Inhale 2 puffs into the lungs every 6 (six) hours as needed for wheezing or shortness of breath.   tamsulosin 0.4 MG Caps capsule Commonly known as: FLOMAX Take 0.4 mg by mouth daily.      Follow-up Information    Evans Lance, MD Follow up on 10/16/2019.   Specialty: Cardiology Why: at 1000 am for new patient visit with electrophysiology. Contact information: [id number] [address] [place] [address] [phone]  Allergies  Allergen Reactions  . Amoxicillin     Makes eyes turn yellow and burn  . Ciprofloxacin     Pt not sure    Consultations:  Cardiology    Procedures/Studies: DG Chest 2 View  Result Date: 09/01/2019 CLINICAL DATA:  Shortness of breath and chest pain for several days, history asthma, hypertension, diabetes mellitus, negative for COVID-19 on 01/22 EXAM: CHEST - 2 VIEW COMPARISON:  08/31/2019 FINDINGS: Minimal enlargement of cardiac silhouette. Mediastinal contours and pulmonary vascularity normal. LEFT lower lobe consolidation consistent with pneumonia. Remaining lungs minimally hyperinflated but clear. No pleural effusion or pneumothorax. IMPRESSION: Minimal enlargement of cardiac silhouette. LEFT lower lobe infiltrate consistent with pneumonia.  Electronically Signed   By: Lavonia Dana M.D.   On: 09/01/2019 13:49   CARDIAC CATHETERIZATION  Result Date: 09/03/2019 Gregory Mitchell is a 79 y.o. male  [id number] LOCATION:  FACILITY: [facility] PHYSICIAN: Quay Burow, M.D. [birth year] DATE OF PROCEDURE:  09/03/2019 DATE OF DISCHARGE: CARDIAC CATHETERIZATION History obtained from chart review.NAZARIO WHITEHAIR is a 79 y.o. male with a hx of longstanding chest pain with nonobstructive coronary artery disease by cardiac catheterization in 0000000, chronic systolic heart failure with evidence of improved LV function in the past (secondary to nonischemic cardiomyopathy), COPD, hypertension, hyperlipidemia, prior pulmonary embolism, chronic kidney disease and diabetes who is being seen today for the evaluation of decompensated heart failure in the setting of reduced LV function and elevated troponin at the request of Dr. Dyann Kief.   Mr. Youker has normal coronary arteries and low filling pressures suggesting that he has been adequately or over diuresed.  He did have a high V waves suggesting that he potentially has significant mitral regurgitation.  His systolic pressures were in the mid to high 90s and I therefore gave him a 250 cc bolus of saline given his low LVEDP and wedge.  He will need guideline directed optimal medical therapy for his LV dysfunction.  The sheath removed and a TR band was placed on the right wrist to achieve patent hemostasis.  The patient left lab in stable condition.  His renal function be carefully monitored.  Dr. Davina Poke was notified of these results. Quay Burow. MD, [facility] 09/03/2019 9:55 AM   DG Chest Portable 1 View  Result Date: 08/31/2019 CLINICAL DATA:  Shortness of breath.  Left chest pressure. EXAM: PORTABLE CHEST 1 VIEW COMPARISON:  10/21/2016. FINDINGS: Cardiomegaly mild pulmonary venous congestion. Diffuse bilateral interstitial prominence. Interstitial edema and/or pneumonitis could present this fashion. Tiny bilateral pleural  effusions. No pneumothorax. IMPRESSION: 1.  Cardiomegaly with pulmonary venous congestion. 2. Diffuse bilateral interstitial prominence interstitial edema and/or pneumonitis could present this fashion. Tiny bilateral pleural effusions. Electronically Signed   By: Marcello Moores  Register   On: 08/31/2019 12:39   ECHOCARDIOGRAM COMPLETE  Result Date: 08/31/2019   ECHOCARDIOGRAM REPORT   Patient Name:   ALISHA DURR Date of Exam: 08/31/2019 Medical Rec #:  [id number]      Height:       66.0 in Accession #:    [id number]     Weight:       173.5 lb Date of Birth:  November 28, 1940      BSA:          1.88 m Patient Age:    34 years       BP:           121/74 mmHg Patient Gender: M  HR:           74 bpm. Exam Location:  [name] Procedure: 2D Echo Indications:    Congestive Heart Failure 428.0 / I50.9  History:        Patient has prior history of Echocardiogram examinations, most                 recent 05/28/2016. CHF, COPD, Signs/Symptoms:Chest Pain and                 Fever; Risk Factors:Hypertension, Diabetes and Former Smoker.                 Chronic renal insufficiency.  Sonographer:    [name] RDCS (AE) Referring Phys: [place]  1. Left ventricular ejection fraction, by visual estimation, is 25 to 30%. The left ventricle has severely decreased function. There is no left ventricular hypertrophy.  2. Elevated left ventricular end-diastolic pressure.  3. Left ventricular diastolic parameters are consistent with Grade II diastolic dysfunction (pseudonormalization).  4. Mild to moderately dilated left ventricular internal cavity size.  5. The left ventricle demonstrates global hypokinesis.  6. Global right ventricle has normal systolic function.The right ventricular size is normal. Mildly increased right ventricular wall thickness.  7. Left atrial size was severely dilated.  8. Right atrial size was moderately dilated.  9. The mitral valve is grossly normal. Mild mitral valve  regurgitation. 10. The tricuspid valve is grossly normal. 11. The tricuspid valve is grossly normal. Tricuspid valve regurgitation is mild. 12. The aortic valve is tricuspid. Aortic valve regurgitation is not visualized. No evidence of aortic valve sclerosis or stenosis. 13. The pulmonic valve was grossly normal. Pulmonic valve regurgitation is not visualized. 14. The inferior vena cava is normal in size with greater than 50% respiratory variability, suggesting right atrial pressure of 3 mmHg. FINDINGS  Left Ventricle: Left ventricular ejection fraction, by visual estimation, is 25 to 30%. The left ventricle has severely decreased function. The left ventricle demonstrates global hypokinesis. The left ventricular internal cavity size was mildly to moderately dilated left ventricle. There is no left ventricular hypertrophy. Left ventricular diastolic parameters are consistent with Grade II diastolic dysfunction (pseudonormalization). Elevated left ventricular end-diastolic pressure. Right Ventricle: The right ventricular size is normal. Mildly increased right ventricular wall thickness. Global RV systolic function is has normal systolic function. Left Atrium: Left atrial size was severely dilated. Right Atrium: Right atrial size was moderately dilated Pericardium: There is no evidence of pericardial effusion. Mitral Valve: The mitral valve is grossly normal. Mild mitral valve regurgitation. Tricuspid Valve: The tricuspid valve is grossly normal. Tricuspid valve regurgitation is mild. Aortic Valve: The aortic valve is tricuspid. Aortic valve regurgitation is not visualized. The aortic valve is structurally normal, with no evidence of sclerosis or stenosis. Pulmonic Valve: The pulmonic valve was grossly normal. Pulmonic valve regurgitation is not visualized. Pulmonic regurgitation is not visualized. Aorta: The aortic root is normal in size and structure. Venous: The inferior vena cava is normal in size with greater than  50% respiratory variability, suggesting right atrial pressure of 3 mmHg. IAS/Shunts: No atrial level shunt detected by color flow Doppler.  LEFT VENTRICLE PLAX 2D LVIDd:         6.37 cm  Diastology LVIDs:         5.33 cm  LV e' lateral:   11.00 cm/s LV PW:         1.08 cm  LV E/e' lateral: 6.7 LV IVS:  0.89 cm  LV e' medial:    4.13 cm/s LVOT diam:     2.30 cm  LV E/e' medial:  17.7 LV SV:         69 ml LV SV Index:   35.78 LVOT Area:     4.15 cm  RIGHT VENTRICLE RV S prime:     19.40 cm/s TAPSE (M-mode): 3.1 cm LEFT ATRIUM             Index       RIGHT ATRIUM           Index LA diam:        4.80 cm 2.55 cm/m  RA Area:     21.60 cm LA Vol (A2C):   89.8 ml 47.70 ml/m RA Volume:   71.90 ml  38.19 ml/m LA Vol (A4C):   79.5 ml 42.23 ml/m LA Biplane Vol: 87.2 ml 46.32 ml/m   AORTA Ao Root diam: 3.10 cm MITRAL VALVE MV Area (PHT): 5.13 cm             SHUNTS MV PHT:        42.92 msec           Systemic Diam: 2.30 cm MV Decel Time: 148 msec MV E velocity: 73.20 cm/s 103 cm/s MV A velocity: 37.30 cm/s 70.3 cm/s MV E/A ratio:  1.96       1.5  Kate Sable MD Electronically signed by Kate Sable MD Signature Date/Time: 08/31/2019/4:26:13 PM    Final       Procedures: right and left cardiac cath  Subjective: Patient is feeling well, no dizziness or light headedness, no chest pain or dyspnea, no nausea or vomiting.   Discharge Exam: Vitals:   09/04/19 0332 09/04/19 0620  BP: 93/62 93/61  Pulse: 70 68  Resp: 17   Temp: 98.2 F (36.8 C)   SpO2: 100%    Vitals:   09/03/19 2033 09/04/19 0332 09/04/19 0334 09/04/19 0620  BP: (!) 106/58 93/62  93/61  Pulse: 81 70  68  Resp:  17    Temp:  98.2 F (36.8 C)    TempSrc:  Oral    SpO2:  100%    Weight:   69.4 kg   Height:        General: Not in pain or dyspnea  Neurology: Awake and alert, non focal  E ENT: no pallor, no icterus, oral mucosa moist Cardiovascular: No JVD. S1-S2 present, rhythmic, no gallops, rubs, or murmurs. No  lower extremity edema. Pulmonary: positive breath sounds bilaterally, adequate air movement, no wheezing, rhonchi or rales. Gastrointestinal. Abdomen with no organomegaly, non tender, no rebound or guarding Skin. No rashes Musculoskeletal: no joint deformities   The results of significant diagnostics from this hospitalization (including imaging, microbiology, ancillary and laboratory) are listed below for reference.     Microbiology: Recent Results (from the past 240 hour(s))  Respiratory Panel by RT PCR (Flu A&B, Covid) - Nasopharyngeal Swab     Status: None   Collection Time: 08/31/19 12:04 PM   Specimen: Nasopharyngeal Swab  Result Value Ref Range Status   SARS Coronavirus 2 by RT PCR NEGATIVE NEGATIVE Final    Comment: (NOTE) SARS-CoV-2 target nucleic acids are NOT DETECTED. The SARS-CoV-2 RNA is generally detectable in upper respiratoy specimens during the acute phase of infection. The lowest concentration of SARS-CoV-2 viral copies this assay can detect is 131 copies/mL. A negative result does not preclude SARS-Cov-2 infection and should not be used as  the sole basis for treatment or other patient management decisions. A negative result may occur with  improper specimen collection/handling, submission of specimen other than nasopharyngeal swab, presence of viral mutation(s) within the areas targeted by this assay, and inadequate number of viral copies (<131 copies/mL). A negative result must be combined with clinical observations, patient history, and epidemiological information. The expected result is Negative. Fact Sheet for Patients:  PinkCheek.be Fact Sheet for Healthcare Providers:  GravelBags.it This test is not yet ap proved or cleared by the [place] FDA and  has been authorized for detection and/or diagnosis of SARS-CoV-2 by FDA under an Emergency Use Authorization (EUA). This EUA will remain  in effect  (meaning this test can be used) for the duration of the COVID-19 declaration under Section 564(b)(1) of the Act, 21 U.S.C. section 360bbb-3(b)(1), unless the authorization is terminated or revoked sooner.    Influenza A by PCR NEGATIVE NEGATIVE Final   Influenza B by PCR NEGATIVE NEGATIVE Final    Comment: (NOTE) The Xpert Xpress SARS-CoV-2/FLU/RSV assay is intended as an aid in  the diagnosis of influenza from Nasopharyngeal swab specimens and  should not be used as a sole basis for treatment. Nasal washings and  aspirates are unacceptable for Xpert Xpress SARS-CoV-2/FLU/RSV  testing. Fact Sheet for Patients: PinkCheek.be Fact Sheet for Healthcare Providers: GravelBags.it This test is not yet approved or cleared by the [place] FDA and  has been authorized for detection and/or diagnosis of SARS-CoV-2 by  FDA under an Emergency Use Authorization (EUA). This EUA will remain  in effect (meaning this test can be used) for the duration of the  Covid-19 declaration under Section 564(b)(1) of the Act, 21  U.S.C. section 360bbb-3(b)(1), unless the authorization is  terminated or revoked. Performed at [facility], [address] [place], [place] [address]      Labs: BNP (last 3 results) Recent Labs    08/31/19 1220  BNP 0000000*   Basic Metabolic Panel: Recent Labs  Lab 08/31/19 1220 08/31/19 1220 09/01/19 0610 09/01/19 0610 09/02/19 1035 09/03/19 0239 09/03/19 0924 09/03/19 0928 09/04/19 0448  NA 139   < > 137   < > 139 137 140  140 140 141  K 4.0   < > 3.8   < > 4.1 4.4 4.3  4.1 4.2 4.6  CL 103  --  99  --  101 102  --   --  104  CO2 27  --  29  --  29 25  --   --  29  GLUCOSE 80  --  88  --  120* 97  --   --  104*  BUN 10  --  12  --  19 26*  --   --  29*  CREATININE 1.22  --  1.36*  --  1.65* 1.79*  --   --  1.80*  CALCIUM 8.7*  --  8.8*  --  8.8* 8.7*  --   --  8.6*  MG 1.8  --   --   --   --   --   --    --   --    < > = values in this interval not displayed.   Liver Function Tests: Recent Labs  Lab 08/31/19 1220 09/03/19 0239  AST 22 18  ALT 14 11  ALKPHOS 34* 42  BILITOT 0.7 0.5  PROT 6.7 6.0*  ALBUMIN 3.4* 3.0*   No results for input(s): LIPASE, AMYLASE in the last  168 hours. No results for input(s): AMMONIA in the last 168 hours. CBC: Recent Labs  Lab 08/31/19 1220 09/02/19 0717 09/03/19 0239 09/03/19 0924 09/03/19 0928  WBC 3.9* 5.4 6.0  --   --   NEUTROABS 2.0  --   --   --   --   HGB 11.0* 11.4* 11.5* 11.9*  11.9* 11.9*  HCT 37.6* 37.7* 38.1* 35.0*  35.0* 35.0*  MCV 91.3 88.3 89.0  --   --   PLT 272 278 277  --   --    Cardiac Enzymes: No results for input(s): CKTOTAL, CKMB, CKMBINDEX, TROPONINI in the last 168 hours. BNP: Invalid input(s): POCBNP CBG: Recent Labs  Lab 09/03/19 0609 09/03/19 1119 09/03/19 1544 09/03/19 2057 09/04/19 0557  GLUCAP 88 180* 79 117* 90   D-Dimer No results for input(s): DDIMER in the last 72 hours. Hgb A1c No results for input(s): HGBA1C in the last 72 hours. Lipid Profile No results for input(s): CHOL, HDL, LDLCALC, TRIG, CHOLHDL, LDLDIRECT in the last 72 hours. Thyroid function studies No results for input(s): TSH, T4TOTAL, T3FREE, THYROIDAB in the last 72 hours.  Invalid input(s): FREET3 Anemia work up No results for input(s): VITAMINB12, FOLATE, FERRITIN, TIBC, IRON, RETICCTPCT in the last 72 hours. Urinalysis No results found for: COLORURINE, APPEARANCEUR, [place], [place], [place], [place], [place], [place], PROTEINUR, UROBILINOGEN, NITRITE, LEUKOCYTESUR Sepsis Labs Invalid input(s): PROCALCITONIN,  WBC,  LACTICIDVEN Microbiology Recent Results (from the past 240 hour(s))  Respiratory Panel by RT PCR (Flu A&B, Covid) - Nasopharyngeal Swab     Status: None   Collection Time: 08/31/19 12:04 PM   Specimen: Nasopharyngeal Swab  Result Value Ref Range Status   SARS Coronavirus 2 by RT PCR NEGATIVE  NEGATIVE Final    Comment: (NOTE) SARS-CoV-2 target nucleic acids are NOT DETECTED. The SARS-CoV-2 RNA is generally detectable in upper respiratoy specimens during the acute phase of infection. The lowest concentration of SARS-CoV-2 viral copies this assay can detect is 131 copies/mL. A negative result does not preclude SARS-Cov-2 infection and should not be used as the sole basis for treatment or other patient management decisions. A negative result may occur with  improper specimen collection/handling, submission of specimen other than nasopharyngeal swab, presence of viral mutation(s) within the areas targeted by this assay, and inadequate number of viral copies (<131 copies/mL). A negative result must be combined with clinical observations, patient history, and epidemiological information. The expected result is Negative. Fact Sheet for Patients:  PinkCheek.be Fact Sheet for Healthcare Providers:  GravelBags.it This test is not yet ap proved or cleared by the [place] FDA and  has been authorized for detection and/or diagnosis of SARS-CoV-2 by FDA under an Emergency Use Authorization (EUA). This EUA will remain  in effect (meaning this test can be used) for the duration of the COVID-19 declaration under Section 564(b)(1) of the Act, 21 U.S.C. section 360bbb-3(b)(1), unless the authorization is terminated or revoked sooner.    Influenza A by PCR NEGATIVE NEGATIVE Final   Influenza B by PCR NEGATIVE NEGATIVE Final    Comment: (NOTE) The Xpert Xpress SARS-CoV-2/FLU/RSV assay is intended as an aid in  the diagnosis of influenza from Nasopharyngeal swab specimens and  should not be used as a sole basis for treatment. Nasal washings and  aspirates are unacceptable for Xpert Xpress SARS-CoV-2/FLU/RSV  testing. Fact Sheet for Patients: PinkCheek.be Fact Sheet for Healthcare  Providers: GravelBags.it This test is not yet approved or cleared by the [place] FDA and  has  been authorized for detection and/or diagnosis of SARS-CoV-2 by  FDA under an Emergency Use Authorization (EUA). This EUA will remain  in effect (meaning this test can be used) for the duration of the  Covid-19 declaration under Section 564(b)(1) of the Act, 21  U.S.C. section 360bbb-3(b)(1), unless the authorization is  terminated or revoked. Performed at [facility] [address], [place], [place] [address]      Time coordinating discharge: 45 minutes  SIGNED:   Tawni Millers, MD  [facility] 09/04/2019, 8:09 AM

## 2019-09-04 NOTE — Evaluation (Signed)
Occupational Therapy Evaluation Patient Details Name: Gregory Mitchell MRN: [id number] DOB: 10/02/1940 Today's Date: 09/04/2019    History of Present Illness Patient is a 79 y/o male who presents with chest pressure and SOB. CXR-pulmonary edema and LLL consistent with PNA. Admitted with acute on chronic CHF. s/p cardiac cath 1/25. PMH includes DM, HTN, COPD, renal insufficiency.   Clinical Impression   PTA, pt was living with his wife and was independent. Pt currently performing ADLs and functional mobility at Supervision level. Pt demonstrating increased activity tolerance as pt performed grooming (washing face, brushing teeth, and shaving) at sink and then mobility in hallway. VSS. Pt reporting he feels much better and hopeful to dc home soon. Recommend dc to home once medically stable per physician. All acute OT needs met and will sign off.     Follow Up Recommendations  No OT follow up;Supervision - Intermittent    Equipment Recommendations  None recommended by OT    Recommendations for Other Services       Precautions / Restrictions Precautions Precautions: Fall Restrictions Weight Bearing Restrictions: No      Mobility Bed Mobility Overal bed mobility: Needs Assistance Bed Mobility: Supine to Sit     Supine to sit: Supervision;HOB elevated     General bed mobility comments: no physical A needed  Transfers Overall transfer level: Needs assistance Equipment used: None Transfers: Sit to/from Stand Sit to Stand: Supervision         General transfer comment: Supervision for safety. Stood from [employer], transferred to chair post ambulation.    Balance Overall balance assessment: Needs assistance Sitting-balance support: Feet supported;No upper extremity supported Sitting balance-Leahy Scale: Good     Standing balance support: During functional activity Standing balance-Leahy Scale: Fair                             ADL either performed or assessed  with clinical judgement   ADL Overall ADL's : Needs assistance/impaired                                       General ADL Comments: Pt performing ADLs and functional mobility at Supervision level. Pt performing grooming at sink including oral care, washing his face, and shaving with supervision. Pt then performing mobility in hallway. VSS     Vision         Perception     Praxis      Pertinent Vitals/Pain Pain Assessment: Faces Faces Pain Scale: No hurt Pain Intervention(s): Monitored during session     Hand Dominance Right   Extremity/Trunk Assessment Upper Extremity Assessment Upper Extremity Assessment: Overall WFL for tasks assessed   Lower Extremity Assessment Lower Extremity Assessment: Defer to PT evaluation   Cervical / Trunk Assessment Cervical / Trunk Assessment: Normal   Communication Communication Communication: HOH   Cognition Arousal/Alertness: Awake/alert Behavior During Therapy: WFL for tasks assessed/performed Overall Cognitive Status: Within Functional Limits for tasks assessed                                 General Comments: pleasant and agreeable to therapy   General Comments  VSS on RA    Exercises     Shoulder Instructions      Home Living Family/patient expects to be discharged to:: Private  residence Living Arrangements: Spouse/significant other;Children Available Help at Discharge: Family;Available 24 hours/day Type of Home: House Home Access: Level entry     Home Layout: One level     Bathroom Shower/Tub: Occupational psychologist: Standard     Home Equipment: Shower seat   Additional Comments: Reports that his wife works ~3 hrs during the day      Prior Functioning/Environment Level of Independence: Independent        Comments: Drives, cooks.         OT Problem List: Decreased activity tolerance;Impaired balance (sitting and/or standing);Decreased knowledge of  precautions      OT Treatment/Interventions:      OT Goals(Current goals can be found in the care plan section) Acute Rehab OT Goals Patient Stated Goal: "I hope to go home today" OT Goal Formulation: With patient Time For Goal Achievement: 09/18/19 Potential to Achieve Goals: Good  OT Frequency:     Barriers to D/C:            Co-evaluation              AM-PAC OT "6 Clicks" Daily Activity     Outcome Measure Help from another person eating meals?: None Help from another person taking care of personal grooming?: None Help from another person toileting, which includes using toliet, bedpan, or urinal?: None Help from another person bathing (including washing, rinsing, drying)?: None Help from another person to put on and taking off regular upper body clothing?: None Help from another person to put on and taking off regular lower body clothing?: None 6 Click Score: 24   End of Session Nurse Communication: Mobility status;Other (comment)(Would benefit from IS education)  Activity Tolerance: Patient tolerated treatment well Patient left: in chair;with call bell/phone within reach  OT Visit Diagnosis: Unsteadiness on feet (R26.81);Other abnormalities of gait and mobility (R26.89);Muscle weakness (generalized) (M62.81)                Time: 7482-7078 OT Time Calculation (min): 28 min Charges:  OT General Charges $OT Visit: 1 Visit OT Evaluation $OT Eval Low Complexity: 1 Low OT Treatments $Self Care/Home Management : 8-22 mins  Bonifacio Pruden MSOT, OTR/L Acute Rehab Pager: [phone] Office: [place] 09/04/2019, 10:53 AM

## 2019-09-04 NOTE — Progress Notes (Signed)
Cardiology Progress Note  Patient ID: Gregory Mitchell MRN: [id number] DOB: 06-26-1941 Date of Encounter: 09/04/2019  Primary Cardiologist: Carlyle Dolly, MD  Subjective  Doing well. Blood pressure a bit better. No symptoms.  ROS:  All other ROS reviewed and negative. Pertinent positives noted in the HPI.     Inpatient Medications  Scheduled Meds: . apixaban  5 mg Oral BID  . aspirin  81 mg Oral Daily  . carvedilol  3.125 mg Oral BID WC  . hydrALAZINE  10 mg Oral Q8H  . insulin aspart  0-5 Units Subcutaneous QHS  . insulin aspart  0-9 Units Subcutaneous TID WC  . isosorbide mononitrate  15 mg Oral Daily  . montelukast  10 mg Oral QHS  . sodium chloride flush  3 mL Intravenous Q12H  . sodium chloride flush  3 mL Intravenous Q12H  . tamsulosin  0.4 mg Oral QPC supper   Continuous Infusions: . sodium chloride    . sodium chloride    . sodium chloride 125 mL/hr at 09/04/19 0902   PRN Meds: sodium chloride, sodium chloride, acetaminophen, alum & mag hydroxide-simeth, ipratropium-albuterol, magnesium hydroxide, Melatonin, morphine injection, ondansetron (ZOFRAN) IV, sodium chloride flush, sodium chloride flush   Vital Signs   Vitals:   09/04/19 0332 09/04/19 0334 09/04/19 0620 09/04/19 0851  BP: 93/62  93/61 (!) 95/59  Pulse: 70  68 70  Resp: 17   18  Temp: 98.2 F (36.8 C)   98.4 F (36.9 C)  TempSrc: Oral   Oral  SpO2: 100%   99%  Weight:  69.4 kg    Height:        Intake/Output Summary (Last 24 hours) at 09/04/2019 0948 Last data filed at 09/04/2019 0934 Gross per 24 hour  Intake 732.43 ml  Output 450 ml  Net 282.43 ml   Last 3 Weights 09/04/2019 09/03/2019 09/02/2019  Weight (lbs) 152 lb 14.4 oz 150 lb 147 lb 7.8 oz  Weight (kg) 69.355 kg 68.04 kg 66.9 kg      Telemetry  Overnight telemetry shows sinus rhythm heart rate 70s, which I personally reviewed.   ECG  The most recent ECG shows normal sinus rhythm, right bundle branch block, left anterior  fascicular block, first AV block consistent with trifascicular block, PVC, which I personally reviewed.   Physical Exam   Vitals:   09/04/19 0332 09/04/19 0334 09/04/19 0620 09/04/19 0851  BP: 93/62  93/61 (!) 95/59  Pulse: 70  68 70  Resp: 17   18  Temp: 98.2 F (36.8 C)   98.4 F (36.9 C)  TempSrc: Oral   Oral  SpO2: 100%   99%  Weight:  69.4 kg    Height:         Intake/Output Summary (Last 24 hours) at 09/04/2019 0948 Last data filed at 09/04/2019 0934 Gross per 24 hour  Intake 732.43 ml  Output 450 ml  Net 282.43 ml    Last 3 Weights 09/04/2019 09/03/2019 09/02/2019  Weight (lbs) 152 lb 14.4 oz 150 lb 147 lb 7.8 oz  Weight (kg) 69.355 kg 68.04 kg 66.9 kg    Body mass index is 24.68 kg/m.  General: Well nourished, well developed, in no acute distress Head: Atraumatic, normal size  Eyes: PEERLA, EOMI  Neck: Supple, no JVD Endocrine: No thryomegaly Cardiac: Normal S1, S2; RRR; no murmurs, rubs, or gallops Lungs: Scattered rhonchi bilaterally Abd: Soft, nontender, no hepatomegaly  Ext: No edema, pulses 2+ Musculoskeletal: No deformities, BUE and BLE  strength normal and equal Skin: Warm and dry, no rashes   Neuro: Alert and oriented to person, place, time, and situation, CNII-XII grossly intact, no focal deficits  Psych: Normal mood and affect   Labs  High Sensitivity Troponin:   Recent Labs  Lab 08/31/19 1220 08/31/19 1414  TROPONINIHS 39* 40*     Cardiac EnzymesNo results for input(s): TROPONINI in the last 168 hours. No results for input(s): TROPIPOC in the last 168 hours.  Chemistry Recent Labs  Lab 08/31/19 1220 09/01/19 0610 09/02/19 1035 09/02/19 1035 09/03/19 0239 09/03/19 0239 09/03/19 0924 09/03/19 0928 09/04/19 0448  NA 139   < > 139   < > 137   < > 140  140 140 141  K 4.0   < > 4.1   < > 4.4   < > 4.3  4.1 4.2 4.6  CL 103   < > 101  --  102  --   --   --  104  CO2 27   < > 29  --  25  --   --   --  29  GLUCOSE 80   < > 120*  --  97  --    --   --  104*  BUN 10   < > 19  --  26*  --   --   --  29*  CREATININE 1.22   < > 1.65*  --  1.79*  --   --   --  1.80*  CALCIUM 8.7*   < > 8.8*  --  8.7*  --   --   --  8.6*  PROT 6.7  --   --   --  6.0*  --   --   --   --   ALBUMIN 3.4*  --   --   --  3.0*  --   --   --   --   AST 22  --   --   --  18  --   --   --   --   ALT 14  --   --   --  11  --   --   --   --   ALKPHOS 34*  --   --   --  42  --   --   --   --   BILITOT 0.7  --   --   --  0.5  --   --   --   --   GFRNONAA 56*   < > 39*  --  35*  --   --   --  35*  GFRAA >60   < > 45*  --  41*  --   --   --  41*  ANIONGAP 9   < > 9  --  10  --   --   --  8   < > = values in this interval not displayed.    Hematology Recent Labs  Lab 08/31/19 1220 08/31/19 1220 09/02/19 0717 09/02/19 0717 09/03/19 0239 09/03/19 0924 09/03/19 0928  WBC 3.9*  --  5.4  --  6.0  --   --   RBC 4.12*  --  4.27  --  4.28  --   --   HGB 11.0*   < > 11.4*   < > 11.5* 11.9*  11.9* 11.9*  HCT 37.6*   < > 37.7*   < > 38.1* 35.0*  35.0* 35.0*  MCV  91.3  --  88.3  --  89.0  --   --   MCH 26.7  --  26.7  --  26.9  --   --   MCHC 29.3*  --  30.2  --  30.2  --   --   RDW 13.7  --  13.6  --  13.6  --   --   PLT 272  --  278  --  277  --   --    < > = values in this interval not displayed.   BNP Recent Labs  Lab 08/31/19 1220  BNP 778.0*    DDimer No results for input(s): DDIMER in the last 168 hours.   Radiology  CARDIAC CATHETERIZATION  Result Date: 09/03/2019 Gregory Mitchell is a 79 y.o. male  [id number] LOCATION:  FACILITY: [facility] PHYSICIAN: Quay Burow, M.D. Feb 02, 1941 DATE OF PROCEDURE:  09/03/2019 DATE OF DISCHARGE: CARDIAC CATHETERIZATION History obtained from chart review.Gregory Mitchell is a 79 y.o. male with a hx of longstanding chest pain with nonobstructive coronary artery disease by cardiac catheterization in 0000000, chronic systolic heart failure with evidence of improved LV function in the past (secondary to nonischemic cardiomyopathy),  COPD, hypertension, hyperlipidemia, prior pulmonary embolism, chronic kidney disease and diabetes who is being seen today for the evaluation of decompensated heart failure in the setting of reduced LV function and elevated troponin at the request of Dr. Dyann Kief.   Gregory Mitchell has normal coronary arteries and low filling pressures suggesting that he has been adequately or over diuresed.  He did have a high V waves suggesting that he potentially has significant mitral regurgitation.  His systolic pressures were in the mid to high 90s and I therefore gave him a 250 cc bolus of saline given his low LVEDP and wedge.  He will need guideline directed optimal medical therapy for his LV dysfunction.  The sheath removed and a TR band was placed on the right wrist to achieve patent hemostasis.  The patient left lab in stable condition.  His renal function be carefully monitored.  Dr. Davina Mitchell was notified of these results. Quay Burow. MD, [facility] 09/03/2019 9:55 AM    Cardiac Studies   TTE 08/31/2019  1. Left ventricular ejection fraction, by visual estimation, is 25 to 30%. The left ventricle has severely decreased function. There is no left ventricular hypertrophy.  2. Elevated left ventricular end-diastolic pressure.  3. Left ventricular diastolic parameters are consistent with Grade II diastolic dysfunction (pseudonormalization).  4. Mild to moderately dilated left ventricular internal cavity size.  5. The left ventricle demonstrates global hypokinesis.  6. Global right ventricle has normal systolic function.The right ventricular size is normal. Mildly increased right ventricular wall thickness.  7. Left atrial size was severely dilated.  8. Right atrial size was moderately dilated.  9. The mitral valve is grossly normal. Mild mitral valve regurgitation. 10. The tricuspid valve is grossly normal. 11. The tricuspid valve is grossly normal. Tricuspid valve regurgitation is mild. 12. The aortic valve is  tricuspid. Aortic valve regurgitation is not visualized. No evidence of aortic valve sclerosis or stenosis. 13. The pulmonic valve was grossly normal. Pulmonic valve regurgitation is not visualized. 14. The inferior vena cava is normal in size with greater than 50% respiratory variability, suggesting right atrial pressure of 3 mmHg.  Patient Profile  Gregory Mitchell is a 79 y.o. male with history of COPD, systolic heart failure with recovery of ejection fraction (35 -> 55%), hypertension, CKD, prior pulmonary  embolism him who was admitted on 08/31/2019 to any pain hospital for shortness of breath concerning for acute decompensated systolic heart failure.  He was found to have newly reduced ejection fraction back down to 25%.  Cardiac catheterization shows normal coronary arteries and likely volume depletion.  Assessment & Plan   1.  Acute systolic heart failure, ejection fraction 25%, with volume depletion -Initially admitted on 08/31/2019 with decompensated heart failure.  Did receive aggressive diuresis.  Urine output did decline in his creatinine rise. -Left heart cath with normal coronaries -Troponin elevation is just demand. -Blood pressures remain a bit soft. 250 cc this morning. -Tolerating 3.125 milligrams Coreg twice daily well -Due to AKI I have added hydralazine 10 mg 3 times daily and Imdur 15 mg daily. I would recommend that he be discharged on this. This is equivalent to BiDil. I think his cardiologist in the outpatient setting can recheck his kidney panel in the next 2 weeks and then start him on an ACE/ARB as his kidney function allows -I would discharge him with 20 mg of p.o. Lasix as needed for lower extremity edema. He is not in a standing dose.  2.  Trifascicular block -EKG with right bundle branch block, left anterior fascicular block and prolonged PR interval. -Discussed case briefly with electrophysiology. We have tried him on a beta-blocker and he is tolerating this well.  Should he have symptoms and not tolerate a beta-blocker he would have an indication for pacemaker. For now we will just follow.  3. History of pulmonary embolism -Okay to restart home Eliquis 5 mg twice daily  [facility] will sign off.   Medication Recommendations: Coreg 3.125 mg twice daily, hydralazine 10 mg 3 times daily, Imdur 15 mg daily, Lasix 20 mg p.o. as needed, resume home Eliquis Other recommendations (labs, testing, etc): None Follow up as an outpatient: We will arrange an outpatient follow-up appointment in 2 weeks with his primary cardiologist  Signed, [place] T. Audie Box, [place]  09/04/2019 9:51 AM

## 2019-09-04 NOTE — Progress Notes (Signed)
Patient states he is very constipated and no BM in about four days, PRN given last night with no relief or satisfaction from patient. Provider called.

## 2019-09-04 NOTE — Care Management Important Message (Signed)
Important Message  Patient Details  Name: KAVEION NATALE MRN: [id number] Date of Birth: 1941/04/28   Medicare Important Message Given:  Yes     Shelda Altes 09/04/2019, 11:31 AM

## 2019-09-12 ENCOUNTER — Ambulatory Visit (INDEPENDENT_AMBULATORY_CARE_PROVIDER_SITE_OTHER): Payer: Medicare HMO | Admitting: Cardiology

## 2019-09-12 ENCOUNTER — Other Ambulatory Visit: Payer: Self-pay

## 2019-09-12 ENCOUNTER — Encounter: Payer: Self-pay | Admitting: Cardiology

## 2019-09-12 ENCOUNTER — Other Ambulatory Visit (HOSPITAL_COMMUNITY)
Admission: RE | Admit: 2019-09-12 | Discharge: 2019-09-12 | Disposition: A | Discharge status: Home / Self Care | Payer: Medicare HMO | Source: Ambulatory Visit | Attending: Cardiology | Admitting: Cardiology

## 2019-09-12 VITALS — BP 133/69 | HR 68 | Temp 98.8°F | Ht 66.0 in | Wt 166.0 lb

## 2019-09-12 DIAGNOSIS — R0602 Shortness of breath: Secondary | ICD-10-CM

## 2019-09-12 DIAGNOSIS — R079 Chest pain, unspecified: Secondary | ICD-10-CM | POA: Diagnosis present

## 2019-09-12 DIAGNOSIS — I5022 Chronic systolic (congestive) heart failure: Secondary | ICD-10-CM

## 2019-09-12 LAB — BASIC METABOLIC PANEL
Anion gap: 6 (ref 5–15)
BUN: 18 mg/dL (ref 8–23)
CO2: 30 mmol/L (ref 22–32)
Calcium: 8.9 mg/dL (ref 8.9–10.3)
Chloride: 103 mmol/L (ref 98–111)
Creatinine, Ser: 1.23 mg/dL (ref 0.61–1.24)
GFR calc Af Amer: >60 mL/min (ref >60)
GFR calc non Af Amer: 55 mL/min — ABNORMAL LOW (ref >60)
Glucose, Bld: 105 mg/dL — ABNORMAL HIGH (ref 70–99)
Potassium: 4.7 mmol/L (ref 3.5–5.1)
Sodium: 139 mmol/L (ref 135–145)

## 2019-09-12 LAB — MAGNESIUM: Magnesium: 2 mg/dL (ref 1.7–2.4)

## 2019-09-12 NOTE — Patient Instructions (Signed)
Medication Instructions:  Your physician recommends that you continue on your current medications as directed. Please refer to the Current Medication list given to you today.   Labwork: today  bmet Magnesium   Testing/Procedures: none  Follow-Up: Your physician recommends that you schedule a follow-up appointment in:  1 month    Any Other Special Instructions Will Be Listed Below (If Applicable).     If you need a refill on your cardiac medications before your next appointment, please call your pharmacy.

## 2019-09-12 NOTE — Progress Notes (Signed)
Clinical Summary Mr. Folkerts is a 79 y.o.male seen today for follow up of the following medical problems. This is a focused hospital follow up visit on his new diagnosis of chronic systolic HF.     1. Chronic systolic HF - LVEF previously as low as 35-40%, repeat echo showed improvement to 45-50% in 04/2013. He has had a prior cath 11/2011 that showed non-obstructive disease - 05/2016 echo LVEF 0000000, grade I diastolic dysfunction  -Jan 2021 admission with volume overload - Jan 2021 echo LVEF 25-30%, grade II DDX, ,normal RV, mild MR,  - Jan 2021 cath: normal coronaries. CI3, mean PA 17, PCWP 17, LVEDP 13 - medical therapy limited due to soft bp's. Diuretic held at discharge. Careful beta blocker due to trifascicular block - discharged on low dose coreg, hydral/nitrates   -no recent SOB/DOE - no recent edema - has not taken any lasix.    2. AKI - Cr 1.8 on discharge.  - baseline Cr around 1.2       Past Medical History:  Diagnosis Date  . Asthma   . Chronic renal insufficiency   . COPD (chronic obstructive pulmonary disease) ([place])   . Hypertension   . Renal insufficiency   . Type 2 diabetes mellitus ([facility])      Allergies  Allergen Reactions  . Amoxicillin     Makes eyes turn yellow and burn  . Ciprofloxacin     Pt not sure     Current Outpatient Medications  Medication Sig Dispense Refill  . albuterol (PROAIR HFA) 108 (90 BASE) MCG/ACT inhaler Inhale 2 puffs into the lungs every 6 (six) hours as needed for wheezing or shortness of breath.     [name] apixaban (ELIQUIS) 5 MG TABS tablet Take 5 mg by mouth 2 (two) times daily.    . carvedilol (COREG) 3.125 MG tablet Take 1 tablet (3.125 mg total) by mouth 2 (two) times daily with a meal. 60 tablet 0  . furosemide (LASIX) 20 MG tablet Take 1 tablet (20 mg total) by mouth daily as needed for fluid or edema (Take in case of leg swelling or weight gain 3 lbs in 24 hours or 5 lbs in 7 days.). 30 tablet 0  .  hydrALAZINE (APRESOLINE) 10 MG tablet Take 1 tablet (10 mg total) by mouth every 8 (eight) hours. 90 tablet 0  . ipratropium-albuterol (DUONEB) 0.5-2.5 (3) MG/3ML SOLN Take 3 mLs by nebulization every 6 (six) hours as needed (for shortness of breath/wheezing).     . isosorbide mononitrate (IMDUR) 30 MG 24 hr tablet Take 0.5 tablets (15 mg total) by mouth daily. 15 tablet 0  . metFORMIN (GLUCOPHAGE-XR) 500 MG 24 hr tablet Take 1,000 mg by mouth 2 (two) times daily.    . Omega-3 Fatty Acids (FISH OIL) 1000 MG CAPS Take 1 capsule by mouth 2 (two) times daily.     . OXYGEN Inhale 2 L into the lungs at bedtime as needed (for shortness of breath).    . tamsulosin (FLOMAX) 0.4 MG CAPS capsule Take 0.4 mg by mouth daily.   0   No current facility-administered medications for this visit.     Past Surgical History:  Procedure Laterality Date  . APPENDECTOMY    . CHOLECYSTECTOMY    . NASAL SINUS SURGERY    . NEPHRECTOMY     RIGHT (Question CA.  No further therapy needed.)  . RIGHT/LEFT HEART CATH AND CORONARY ANGIOGRAPHY N/A 09/03/2019   Procedure: RIGHT/LEFT HEART CATH  AND CORONARY ANGIOGRAPHY;  Surgeon: Lorretta Harp, MD;  Location: [facility];  Service: Cardiovascular;  Laterality: N/A;     Allergies  Allergen Reactions  . Amoxicillin     Makes eyes turn yellow and burn  . Ciprofloxacin     Pt not sure      Family History  Problem Relation Age of Onset  . Lung cancer Brother   . Stroke Mother   . Breast cancer Mother      Social History Mr. Duxbury reports that he quit smoking about 43 years ago. His smoking use included cigarettes. He started smoking about 62 years ago. He has a 15.00 pack-year smoking history. He quit smokeless tobacco use about 13 years ago.  His smokeless tobacco use included chew. Mr. Boni has no history on file for alcohol.   Review of Systems CONSTITUTIONAL: No weight loss, fever, chills, weakness or fatigue.  HEENT: Eyes: No visual  loss, blurred vision, double vision or yellow sclerae.No hearing loss, sneezing, congestion, runny nose or sore throat.  SKIN: No rash or itching.  CARDIOVASCULAR: per hpi RESPIRATORY: No shortness of breath, cough or sputum.  GASTROINTESTINAL: No anorexia, nausea, vomiting or diarrhea. No abdominal pain or blood.  GENITOURINARY: No burning on urination, no polyuria NEUROLOGICAL: No headache, dizziness, syncope, paralysis, ataxia, numbness or tingling in the extremities. No change in bowel or bladder control.  MUSCULOSKELETAL: No muscle, back pain, joint pain or stiffness.  LYMPHATICS: No enlarged nodes. No history of splenectomy.  PSYCHIATRIC: No history of depression or anxiety.  ENDOCRINOLOGIC: No reports of sweating, cold or heat intolerance. No polyuria or polydipsia.  [name]   Physical Examination Today's Vitals   09/12/19 0954  BP: 133/69  Pulse: 68  Temp: 98.8 F (37.1 C)  SpO2: 96%  Weight: 166 lb (75.3 kg)  Height: 5\' 6"  (1.676 m)   Body mass index is 26.79 kg/m.  Gen: resting comfortably, no acute distress HEENT: no scleral icterus, pupils equal round and reactive, no palptable cervical adenopathy,  CV: RRR, no m/r/g, no jvd Resp: coarse bilaterally GI: abdomen is soft, non-tender, non-distended, normal bowel sounds, no hepatosplenomegaly MSK: extremities are warm, no edema.  Skin: warm, no rash Neuro:  no focal deficits Psych: appropriate affect   Diagnostic Studies  04/2013 echo Study Conclusions  - Left ventricle: The cavity size was mildly dilated. Wall thickness was increased in a pattern of mild to moderateLVH. Systolic function was mildly reduced. The estimated ejection fraction was in the range of 45% to 50%. There is mild global hypokinesis. There was an increased relative contribution of atrial contraction to ventricular filling. Doppler parameters are consistent with abnormal left ventricular relaxation (grade 1 diastolic  dysfunction). Doppler parameters are consistent with high ventricular filling pressure. - Mitral valve: Calcified annulus. Mildly thickened leaflets . Trivial to mild regurgitation. - Left atrium: The atrium was mildly dilated. - Right ventricle: The cavity size was mildly dilated. Wall thickness was normal. Systolic function was mildly reduced. - Right atrium: The atrium was mildly dilated. - Pericardium, extracardiac: A trivial pericardial effusion was identified.   11/2011 Cath Procedural Findings:  Hemodynamics:  AO157/77 LV154/22  Coronary angiography:  Coronary dominance: Right  Left mainstem: Normal  Left anterior descending (LAD): Large vessel wrapping the apex. Mild lumina irregularities. D1 small normal. D2 small normal.  Left circumflex (LCx): AV group luminal irregularities. OM1 large and normal. PL x 2 small normal  Right coronary artery (RCA): Dominant. Long mid 25%. PDA normal. PL  moderate and normal.   Left ventriculography: Left ventricle not injected  Final Conclusions: Mild coronary plaque. Non ischemic cardiomyopathy  Recommendations: Medical management.     12/2014 Exercise Cardiolite IMPRESSION: 1. Moderate size region of inferior wall myocardial scar. No ischemic territories seen. Overlying soft tissue attenuation artifact cannot entirely be ruled out.  2. Mild inferior wall hypokinesis.  3. Left ventricular ejection fraction 48%  4. Low to intermediate-risk stress test findings*.   05/2016 echo Study Conclusions  - Left ventricle: The cavity size was normal. Wall thickness was increased in a pattern of mild LVH. Systolic function was normal. The estimated ejection fraction was in the range of 55% to 60%. Wall motion was normal; there were no regional wall motion abnormalities.  Doppler parameters are consistent with abnormal left ventricular relaxation (grade 1 diastolic dysfunction). - Aortic valve: Mildly calcified annulus. Trileaflet; mildly thickened leaflets. Valve area (VTI): 4.03 cm^2. Valve area (Vmax): 4.25 cm^2. Valve area (Vmean): 4.49 cm^2. - Left atrium: The atrium was mildly to moderately dilated. - Atrial septum: No defect or patent foramen ovale was identified. - Technically adequate study.    Assessment and Plan   1. Chronic systolic HF - new diagnosis during recent admission - medical therapy limited by renal dysfunction, also trifascicular block on EKG limit beta blocker dosing - repeat labs, pending Cr may consider low dose ARB - appears euvolemic, continue prn lasix. DIscussed in detail daily weights, low sodium diet   2. AKI - Cr elevation during admission during diuresis - repeat labs   F/u 1 month  Arnoldo Lenis, M.D.

## 2019-09-26 ENCOUNTER — Telehealth: Payer: Self-pay | Admitting: *Deleted

## 2019-09-26 ENCOUNTER — Encounter: Payer: Self-pay | Admitting: *Deleted

## 2019-09-26 DIAGNOSIS — I10 Essential (primary) hypertension: Secondary | ICD-10-CM

## 2019-09-26 MED ORDER — LOSARTAN POTASSIUM 25 MG PO TABS: 12.5000 mg | ORAL_TABLET | Freq: Every day | ORAL | 1 refills | Status: DC

## 2019-09-26 NOTE — Telephone Encounter (Signed)
OPENED IN ERROR

## 2019-09-26 NOTE — Telephone Encounter (Signed)
-----   Message from Arnoldo Lenis, MD sent at 09/17/2019  8:07 AM EST ----- Labs look good, kidney function much improved and actually back to normal. Please start losartan 12.5mg  daily, this is a good medication to help strengthen his heart.BMET again in 2 weeks   Zandra Abts MD

## 2019-09-26 NOTE — Telephone Encounter (Signed)
Tried multiple times to reach pt in the last week with no return call - will mail letter and lab orders and send medication into pharmacy with results letter

## 2019-09-28 NOTE — TOC Progression Note (Signed)
Transition of Care [facility]) - Progression Note    Patient Details  Name: Gregory Mitchell MRN: [id number] Date of Birth: 1941/04/25  Transition of Care [facility]) CM/SW Contact  Zenon Mayo, RN Phone Number: 09/28/2019, 2:31 PM  Clinical Narrative:    [name] with [facility] and rehab [phone] and phone is [phone], states she has been trying to contact patient and every time she calls it states vm has not been set up yet.  Also tried calling the contact person number but it just rings busy.  NCM tried to call patient also and got the same results.      Barriers to Discharge: No Barriers Identified  Expected Discharge Plan and Services           Expected Discharge Date: 09/04/19               DME Arranged: (NA)         HH Arranged: NA           Social Determinants of Health (SDOH) Interventions    Readmission Risk Interventions No flowsheet data found.

## 2019-10-04 NOTE — Telephone Encounter (Signed)
Pt received letter and returned call pt aware that losartan was sent to [facility] in [place] and will start this tomorrow - will have repeat labs done in 2 weeks. Pt voiced understanding - has f/u scheduled with Dr Harl Bowie in March

## 2019-10-16 ENCOUNTER — Ambulatory Visit: Payer: Medicare HMO | Admitting: Internal Medicine

## 2019-10-18 ENCOUNTER — Other Ambulatory Visit: Payer: Self-pay

## 2019-10-18 ENCOUNTER — Telehealth: Payer: Self-pay | Admitting: Licensed Clinical Social Worker

## 2019-10-18 ENCOUNTER — Ambulatory Visit (INDEPENDENT_AMBULATORY_CARE_PROVIDER_SITE_OTHER): Payer: Medicare HMO | Admitting: Cardiology

## 2019-10-18 ENCOUNTER — Encounter: Payer: Self-pay | Admitting: Cardiology

## 2019-10-18 VITALS — BP 140/68 | HR 96 | Ht 66.0 in | Wt 158.8 lb

## 2019-10-18 DIAGNOSIS — I5022 Chronic systolic (congestive) heart failure: Secondary | ICD-10-CM

## 2019-10-18 DIAGNOSIS — I10 Essential (primary) hypertension: Secondary | ICD-10-CM

## 2019-10-18 NOTE — Progress Notes (Signed)
Clinical Summary Mr. Hannula is a 79 y.o.male  1. Chronic systolic HF - LVEF previously as low as 35-40%, repeat echo showed improvement to 45-50% in 04/2013. He has had a prior cath 11/2011 that showed non-obstructive disease - 05/2016 echo LVEF 0000000, grade I diastolic dysfunction  -Jan 2021 admission with volume overload - Jan 2021 echo LVEF 25-30%, grade II DDX, ,normal RV, mild MR,  - Jan 2021 cath: normal coronaries. CI 3, mean PA 17, PCWP 17, LVEDP 13 - medical therapy limited due to soft bp's. Diuretic held at discharge. Careful beta blocker due to trifascicular block - discharged on low dose coreg, hydral/nitrates  - renal function had improved by 09/2019 check, was to start losartan 12.5mg  daily.  - compliant with meds - some SOB up and down, some coughing and wheezing. Dr Koleen Nimrod started abx and steroid - no recent edema.      2. COPD - tobacco x 20 years, quit 40 years ago.  - followed by Dr Koleen Nimrod.  - recent exacerbation, has been on steroids and abx recently   4. HTN  - has not taken meds yet today - he is also currently on prednisone.    5. HL - muscle aches on statinprevoiusly - currently diet controlled   6. 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 By history appears unprovoked.  - he is on eliquis   Past Medical History:  Diagnosis Date  . Asthma   . Chronic renal insufficiency   . COPD (chronic obstructive pulmonary disease) ([place])   . Hypertension   . Renal insufficiency   . Type 2 diabetes mellitus (H[facility])      Allergies  Allergen Reactions  . Amoxicillin     Makes eyes turn yellow and burn  . Ciprofloxacin     Pt not sure     Current Outpatient Medications  Medication Sig Dispense Refill  . albuterol (PROAIR HFA) 108 (90 BASE) MCG/ACT inhaler Inhale 2 puffs into the lungs every 6 (six) hours as needed for wheezing or shortness of breath.     [name] apixaban  (ELIQUIS) 5 MG TABS tablet Take 5 mg by mouth 2 (two) times daily.    . carvedilol (COREG) 3.125 MG tablet Take 1 tablet (3.125 mg total) by mouth 2 (two) times daily with a meal. 60 tablet 0  . furosemide (LASIX) 20 MG tablet Take 1 tablet (20 mg total) by mouth daily as needed for fluid or edema (Take in case of leg swelling or weight gain 3 lbs in 24 hours or 5 lbs in 7 days.). (Patient not taking: Reported on 09/12/2019) 30 tablet 0  . hydrALAZINE (APRESOLINE) 10 MG tablet Take 1 tablet (10 mg total) by mouth every 8 (eight) hours. 90 tablet 0  . ipratropium-albuterol (DUONEB) 0.5-2.5 (3) MG/3ML SOLN Take 3 mLs by nebulization every 6 (six) hours as needed (for shortness of breath/wheezing).     . isosorbide mononitrate (IMDUR) 30 MG 24 hr tablet Take 0.5 tablets (15 mg total) by mouth daily. 15 tablet 0  . losartan (COZAAR) 25 MG tablet Take 0.5 tablets (12.5 mg total) by mouth daily. 45 tablet 1  . metFORMIN (GLUCOPHAGE-XR) 500 MG 24 hr tablet Take 1,000 mg by mouth 2 (two) times daily.    . Omega-3 Fatty Acids (FISH OIL) 1000 MG CAPS Take 1 capsule by mouth 2 (two) times daily.     . OXYGEN Inhale 2 L into  the lungs at bedtime as needed (for shortness of breath).    . predniSONE (DELTASONE) 10 MG tablet Take 10 mg by mouth daily.    . tamsulosin (FLOMAX) 0.4 MG CAPS capsule Take 0.4 mg by mouth daily.   0   No current facility-administered medications for this visit.     Past Surgical History:  Procedure Laterality Date  . APPENDECTOMY    . CHOLECYSTECTOMY    . NASAL SINUS SURGERY    . NEPHRECTOMY     RIGHT (Question CA.  No further therapy needed.)  . RIGHT/LEFT HEART CATH AND CORONARY ANGIOGRAPHY N/A 09/03/2019   Procedure: RIGHT/LEFT HEART CATH AND CORONARY ANGIOGRAPHY;  Surgeon: Lorretta Harp, MD;  Location: [facility];  Service: Cardiovascular;  Laterality: N/A;     Allergies  Allergen Reactions  . Amoxicillin     Makes eyes turn yellow and burn  . Ciprofloxacin      Pt not sure      Family History  Problem Relation Age of Onset  . Lung cancer Brother   . Stroke Mother   . Breast cancer Mother      Social History Mr. Whitted reports that he quit smoking about 43 years ago. His smoking use included cigarettes. He started smoking about 62 years ago. He has a 15.00 pack-year smoking history. He quit smokeless tobacco use about 13 years ago.  His smokeless tobacco use included chew. Mr. Welden reports previous alcohol use.   Review of Systems CONSTITUTIONAL: No weight loss, fever, chills, weakness or fatigue.  HEENT: Eyes: No visual loss, blurred vision, double vision or yellow sclerae.No hearing loss, sneezing, congestion, runny nose or sore throat.  SKIN: No rash or itching.  CARDIOVASCULAR: per hpi RESPIRATORY: No shortness of breath, cough or sputum.  GASTROINTESTINAL: No anorexia, nausea, vomiting or diarrhea. No abdominal pain or blood.  GENITOURINARY: No burning on urination, no polyuria NEUROLOGICAL: No headache, dizziness, syncope, paralysis, ataxia, numbness or tingling in the extremities. No change in bowel or bladder control.  MUSCULOSKELETAL: No muscle, back pain, joint pain or stiffness.  LYMPHATICS: No enlarged nodes. No history of splenectomy.  PSYCHIATRIC: No history of depression or anxiety.  ENDOCRINOLOGIC: No reports of sweating, cold or heat intolerance. No polyuria or polydipsia.  [name]   Physical Examination Today's Vitals   10/18/19 1149  BP: 140/68  Pulse: 96  SpO2: 93%  Weight: 158 lb 12.8 oz (72 kg)  Height: 5\' 6"  (1.676 m)   Body mass index is 25.63 kg/m.  Gen: resting comfortably, no acute distress HEENT: no scleral icterus, pupils equal round and reactive, no palptable cervical adenopathy,  CV: RRR, no m/r/g, no jvd Resp: coarse bilaterally GI: abdomen is soft, non-tender, non-distended, normal bowel sounds, no hepatosplenomegaly MSK: extremities are warm, no edema.  Skin: warm, no rash Neuro:  no  focal deficits Psych: appropriate affect   Diagnostic Studies  04/2013 echo Study Conclusions  - Left ventricle: The cavity size was mildly dilated. Wall thickness was increased in a pattern of mild to moderateLVH. Systolic function was mildly reduced. The estimated ejection fraction was in the range of 45% to 50%. There is mild global hypokinesis. There was an increased relative contribution of atrial contraction to ventricular filling. Doppler parameters are consistent with abnormal left ventricular relaxation (grade 1 diastolic dysfunction). Doppler parameters are consistent with high ventricular filling pressure. - Mitral valve: Calcified annulus. Mildly thickened leaflets . Trivial to mild regurgitation. - Left atrium: The atrium was mildly dilated. -  Right ventricle: The cavity size was mildly dilated. Wall thickness was normal. Systolic function was mildly reduced. - Right atrium: The atrium was mildly dilated. - Pericardium, extracardiac: A trivial pericardial effusion was identified.   11/2011 Cath Procedural Findings:  Hemodynamics:  AO157/77 LV154/22  Coronary angiography:  Coronary dominance: Right  Left mainstem: Normal  Left anterior descending (LAD): Large vessel wrapping the apex. Mild lumina irregularities. D1 small normal. D2 small normal.  Left circumflex (LCx): AV group luminal irregularities. OM1 large and normal. PL x 2 small normal  Right coronary artery (RCA): Dominant. Long mid 25%. PDA normal. PL moderate and normal.   Left ventriculography: Left ventricle not injected  Final Conclusions: Mild coronary plaque. Non ischemic cardiomyopathy  Recommendations: Medical management.     12/2014 Exercise Cardiolite IMPRESSION: 1. Moderate size region of inferior wall myocardial scar.  No ischemic territories seen. Overlying soft tissue attenuation artifact cannot entirely be ruled out.  2. Mild inferior wall hypokinesis.  3. Left ventricular ejection fraction 48%  4. Low to intermediate-risk stress test findings*.   05/2016 echo Study Conclusions  - Left ventricle: The cavity size was normal. Wall thickness was increased in a pattern of mild LVH. Systolic function was normal. The estimated ejection fraction was in the range of 55% to 60%. Wall motion was normal; there were no regional wall motion abnormalities. Doppler parameters are consistent with abnormal left ventricular relaxation (grade 1 diastolic dysfunction). - Aortic valve: Mildly calcified annulus. Trileaflet; mildly thickened leaflets. Valve area (VTI): 4.03 cm^2. Valve area (Vmax): 4.25 cm^2. Valve area (Vmean): 4.49 cm^2. - Left atrium: The atrium was mildly to moderately dilated. - Atrial septum: No defect or patent foramen ovale was identified. - Technically adequate study.    Assessment and Plan   1. Chronic systolic HF - new diagnosis during recent admission - medical therapy limited by renal dysfunction, also trifascicular block on EKG limit beta blocker dosing - euvolemic today. Has not taken meds today, unclear what his bp is when on meds. He will call with bp log on Monday, may tirate hydral/imdur pending results.  - repeat echo in next few months   2. HTN - elevated in clinic but has not taken meds yet - he will call Monday with a home bp log    F/u 2 months   Arnoldo Lenis, M.D

## 2019-10-18 NOTE — Patient Instructions (Signed)
Your physician recommends that you schedule a follow-up appointment in: 2 [place]  Your physician recommends that you continue on your current medications as directed. Please refer to the Current Medication list given to you today.  RECORD YOUR BLOOD PRESSURE READINGS AND CALL us ON Monday WITH AN UPDATE  Thank you for choosing [facility]!!

## 2019-10-18 NOTE — Telephone Encounter (Signed)
CSW referred to assist patient with obtaining a BP cuff. CSW contacted patient to inform cuff will be delivered to home. Patient grateful for support and assistance. CSW available as needed. Jackie Kervin Bones, LCSW, CCSW-MCS [phone]  

## 2019-10-25 ENCOUNTER — Telehealth: Payer: Self-pay | Admitting: Cardiology

## 2019-10-25 NOTE — Telephone Encounter (Signed)
Patient called stating that he is returning a call . States that he spoke with a nurse in regards to a medication. He did not know what medication.

## 2019-10-26 NOTE — Telephone Encounter (Signed)
Pt was verifying that we had sent in losartan to [facility] - aware that we sent refills 09/26/2019 90 day supply

## 2019-11-06 ENCOUNTER — Other Ambulatory Visit: Payer: Self-pay | Admitting: Cardiology

## 2019-11-14 ENCOUNTER — Other Ambulatory Visit: Payer: Self-pay | Admitting: Cardiology

## 2019-11-26 NOTE — Progress Notes (Addendum)
Cardiology Office Note  Date: 11/27/2019   ID: Gregory Mitchell, DOB 1940-08-27, MRN [id number]  PCP:  Neale Burly, MD  Cardiologist:  Carlyle Dolly, MD Electrophysiologist:  None   Chief Complaint: F/U Chronic HFrEF, HTN, HLD, Bilateral PE  History of Present Illness: Gregory Mitchell is a 79 y.o. male with a history of  Chronic HFrEF, HTN, HLD, Bilateral PE, COPD  Recent hospital admission 08/31/2019 for complaints of dyspnea, orthopnea, and chest pressure.  He reported 4 to 5 days of worsening dyspnea and chest pressure associated with orthopnea.  He was admitted for acute on chronic systolic heart failure.  He received aggressive diuresis with IV furosemide with good response.  Cardiac catheterization on 09/03/2019 showed normal coronary arteries.  Echo showed reduced EF of 25 to 30% with LV global hypokinesis.  Last known EF in 2017 was 55 to 60%   He presents today with no particular complaints.  He denies any significant anginal or exertional symptoms.  Blood pressure is elevated on arrival 158/76..  Recheck on left arm was 140/80.  Patient states he has not taken any of his antihypertensive medications today.  Denies any lower extremity edema.  States he recently went to urgent care for upper respiratory infection and is now taking doxycycline and tapered steroid dosage.  States he was coughing up yellow-colored productive sputum.  States he is having some mild chest pain.  States his chest hurts usually at night but not associated with exertion denies any radiation or associated nausea, vomiting, diaphoresis.  He is asking for Viagra.  Past Medical History:  Diagnosis Date  . Asthma   . Chronic renal insufficiency   . COPD (chronic obstructive pulmonary disease) ([place])   . Hypertension   . Renal insufficiency   . Type 2 diabetes mellitus ([place])     Past Surgical History:  Procedure Laterality Date  . APPENDECTOMY    . CHOLECYSTECTOMY    . NASAL SINUS SURGERY    .  NEPHRECTOMY     RIGHT (Question CA.  No further therapy needed.)  . RIGHT/LEFT HEART CATH AND CORONARY ANGIOGRAPHY N/A 09/03/2019   Procedure: RIGHT/LEFT HEART CATH AND CORONARY ANGIOGRAPHY;  Surgeon: Lorretta Harp, MD;  Location: [facility];  Service: Cardiovascular;  Laterality: N/A;    Current Outpatient Medications  Medication Sig Dispense Refill  . albuterol (PROAIR HFA) 108 (90 BASE) MCG/ACT inhaler Inhale 2 puffs into the lungs every 6 (six) hours as needed for wheezing or shortness of breath.     [name] apixaban (ELIQUIS) 5 MG TABS tablet Take 5 mg by mouth 2 (two) times daily.    . carvedilol (COREG) 3.125 MG tablet TAKE ONE (1) TABLET (3.125 MG TOTAL) BY MOUTH TWO (2) TIMES DAILY WITH A MEAL.  60 tablet 6  . hydrALAZINE (APRESOLINE) 10 MG tablet TAKE ONE (1) TABLET (10 MG TOTAL) BY MOUTH EVERY 8 (EIGHT) HOURS.  90 tablet 3  . ipratropium-albuterol (DUONEB) 0.5-2.5 (3) MG/3ML SOLN Take 3 mLs by nebulization every 6 (six) hours as needed (for shortness of breath/wheezing).     [name] losartan (COZAAR) 25 MG tablet TAKE 0.5 TABLETS (12.5 MG TOTAL) BY MOUTH DAILY.  45 tablet 1  . metFORMIN (GLUCOPHAGE-XR) 500 MG 24 hr tablet Take 1,000 mg by mouth 2 (two) times daily.    . Omega-3 Fatty Acids (FISH OIL) 1000 MG CAPS Take 1 capsule by mouth 2 (two) times daily.     . OXYGEN Inhale 2 L  into the lungs at bedtime as needed (for shortness of breath).    . predniSONE (DELTASONE) 10 MG tablet Take 10 mg by mouth daily.    . tamsulosin (FLOMAX) 0.4 MG CAPS capsule Take 0.4 mg by mouth daily.   0  . doxycycline (MONODOX) 100 MG capsule Take 100 mg by mouth 2 (two) times daily.    . furosemide (LASIX) 20 MG tablet Take 1 tablet (20 mg total) by mouth daily as needed for fluid or edema (Take in case of leg swelling or weight gain 3 lbs in 24 hours or 5 lbs in 7 days.). (Patient not taking: Reported on 11/27/2019) 30 tablet 0  . isosorbide mononitrate (IMDUR) 30 MG 24 hr tablet Take 0.5 tablets (15 mg  total) by mouth daily. (Patient not taking: Reported on 11/27/2019) 15 tablet 0   No current facility-administered medications for this visit.   Allergies:  Amoxicillin and Ciprofloxacin   Social History: The patient  reports that he quit smoking about 43 years ago. His smoking use included cigarettes. He started smoking about 62 years ago. He has a 15.00 pack-year smoking history. He quit smokeless tobacco use about 13 years ago.  His smokeless tobacco use included chew. He reports previous alcohol use. He reports that he does not use drugs.   Family History: The patient's family history includes Breast cancer in his mother; Lung cancer in his brother; Stroke in his mother.   ROS:  Please see the history of present illness. Otherwise, complete review of systems is positive for none.  All other systems are reviewed and negative.   Physical Exam: VS:  BP (!) 158/76   Pulse 96   Ht 5\' 6"  (1.676 m)   Wt 160 lb 9.6 oz (72.8 kg)   SpO2 96%   BMI 25.92 kg/m , BMI Body mass index is 25.92 kg/m.  Wt Readings from Last 3 Encounters:  11/27/19 160 lb 9.6 oz (72.8 kg)  10/18/19 158 lb 12.8 oz (72 kg)  09/12/19 166 lb (75.3 kg)    General: Patient appears comfortable at rest. Neck: Supple, no elevated JVP or carotid bruits, no thyromegaly. Lungs: Mild crackles and prolonged expiratory phase, nonlabored breathing at rest. Cardiac: Regular rate and rhythm, no S3 or significant systolic murmur, no pericardial rub. Abdomen: Soft, nontender, no hepatomegaly, bowel sounds present, no guarding or rebound. Extremities: No pitting edema, distal pulses 2+. Skin: Warm and dry. Musculoskeletal: No kyphosis. Neuropsychiatric: Alert and oriented x3, affect grossly appropriate.  ECG:  September 04, 2019 EKG sinus rhythm with first-degree AV block with occasional PVCs and premature atrial complexes, LAD, first-degree AV block, right bundle branch block, T wave abnormality consider lateral ischemia.  Compared  to previous tracing PAC and PVC are new.  Recent Labwork: 08/31/2019: B Natriuretic Peptide 778.0; TSH 1.017 09/03/2019: ALT 11; AST 18; Hemoglobin 11.9; Platelets 277 09/12/2019: BUN 18; Creatinine, Ser 1.23; Magnesium 2.0; Potassium 4.7; Sodium 139  No results found for: CHOL, TRIG, HDL, CHOLHDL, VLDL, LDLCALC, LDLDIRECT  Other Studies Reviewed Today:  Cardiac Catheterization 09/03/2019  IMPRESSION: Mr. Booth has normal coronary arteries and low filling pressures suggesting that he has been adequately or over diuresed.  He did have a high V waves suggesting that he potentially has significant mitral regurgitation.  His systolic pressures were in the mid to high 90s and I therefore gave him a 250 cc bolus of saline given his low LVEDP and wedge.  He will need guideline directed optimal medical therapy for his  LV dysfunction.  The sheath removed and a TR band was placed on the right wrist to achieve patent hemostasis.  The patient left lab in stable condition.  His renal function be carefully monitored.  Dr. Davina Poke was notified of these results.   Echocardiogram 08/31/2019  1. Left ventricular ejection fraction, by visual estimation, is 25 to 30%. The left ventricle has severely decreased function. There is no left ventricular hypertrophy. 2. Elevated left ventricular end-diastolic pressure. 3. Left ventricular diastolic parameters are consistent with Grade II diastolic dysfunction (pseudonormalization). 4. Mild to moderately dilated left ventricular internal cavity size. 5. The left ventricle demonstrates global hypokinesis. 6. Global right ventricle has normal systolic function.The right ventricular size is normal. Mildly increased right ventricular wall thickness. 7. Left atrial size was severely dilated. 8. Right atrial size was moderately dilated. 9. The mitral valve is grossly normal. Mild mitral valve regurgitation. 10. The tricuspid valve is grossly normal. 11. The tricuspid  valve is grossly normal. Tricuspid valve regurgitation is mild. 12. The aortic valve is tricuspid. Aortic valve regurgitation is not visualized. No evidence of aortic valve sclerosis or stenosis. 13. The pulmonic valve was grossly normal. Pulmonic valve regurgitation is not visualized. 14. The inferior vena cava is normal in size with greater than 50% respiratory variability, suggesting right atrial pressure of 3 mmHg.     04/2013 echo Study Conclusions  - Left ventricle: The cavity size was mildly dilated. Wall thickness was increased in a pattern of mild to moderateLVH. Systolic function was mildly reduced. The estimated ejection fraction was in the range of 45% to 50%. There is mild global hypokinesis. There was an increased relative contribution of atrial contraction to ventricular filling. Doppler parameters are consistent with abnormal left ventricular relaxation (grade 1 diastolic dysfunction). Doppler parameters are consistent with high ventricular filling pressure. - Mitral valve: Calcified annulus. Mildly thickened leaflets . Trivial to mild regurgitation. - Left atrium: The atrium was mildly dilated. - Right ventricle: The cavity size was mildly dilated. Wall thickness was normal. Systolic function was mildly reduced. - Right atrium: The atrium was mildly dilated. - Pericardium, extracardiac: A trivial pericardial effusion was identified.   11/2011 Cath Procedural Findings:  Hemodynamics:  AO157/77 LV154/22  Coronary angiography:  Coronary dominance: Right  Left mainstem: Normal  Left anterior descending (LAD): Large vessel wrapping the apex. Mild lumina irregularities. D1 small normal. D2 small normal.  Left circumflex (LCx): AV group luminal irregularities. OM1 large and normal. PL x 2 small normal  Right coronary  artery (RCA): Dominant. Long mid 25%. PDA normal. PL moderate and normal.   Left ventriculography: Left ventricle not injected  Final Conclusions: Mild coronary plaque. Non ischemic cardiomyopathy  Recommendations: Medical management.     12/2014 Exercise Cardiolite IMPRESSION: 1. Moderate size region of inferior wall myocardial scar. No ischemic territories seen. Overlying soft tissue attenuation artifact cannot entirely be ruled out.  2. Mild inferior wall hypokinesis.  3. Left ventricular ejection fraction 48%  4. Low to intermediate-risk stress test findings*.   05/2016 echo Study Conclusions  - Left ventricle: The cavity size was normal. Wall thickness was increased in a pattern of mild LVH. Systolic function was normal. The estimated ejection fraction was in the range of 55% to 60%. Wall motion was normal; there were no regional wall motion abnormalities. Doppler parameters are consistent with abnormal left ventricular relaxation (grade 1 diastolic dysfunction). - Aortic valve: Mildly calcified annulus. Trileaflet; mildly thickened leaflets. Valve area (VTI): 4.03 cm^2. Valve area (Vmax): 4.25  cm^2. Valve area (Vmean): 4.49 cm^2. - Left atrium: The atrium was mildly to moderately dilated. - Atrial septum: No defect or patent foramen ovale was identified. - Technically adequate study.    Assessment and Plan:  1. Essential hypertension   2. Acute on chronic combined systolic (congestive) and diastolic (congestive) heart failure (HCC)   3. Cardiomyopathy, secondary ([place])    1. Essential hypertension Blood pressure is elevated on arrival today at 158/76.  Patient states he has not taken any of his antihypertensive medications.  Advised him to continue losartan 25 mg p.o. daily, hydralazine 10 mg every 8 hours,  2. Acute on chronic combined systolic (congestive) and diastolic (congestive) heart failure (HCC) Recent  hospitalization for acute on chronic combined systolic and diastolic heart failure and received aggressive IV diuretic therapy.  Today's weight is 160 on arrival.  At discharge she was started on hydralazine and isosorbide for afterload reduction.  As needed furosemide 20 mg for lower extremity or weight gain of 3 pounds in 24 hours or 5 pounds in 7 days.  Continue hydralazine 10 mg p.o. every 8 hours, carvedilol 3.125 mg p.o. twice daily, Lasix as needed for lower extremity edema or weight gain of 3 pounds in 24 hours or 5 pounds in 7 days.  Patient has not been taking his Imdur.  Reordered Imdur 30 mg daily and advised patient he needs to take this medication.  Reorder losartan at 25 mg dosage.  If renal function stabilizes and blood pressure improves may need to start Entresto in the future if able and cost is not prohibitive to the patient.  3. Cardiomyopathy, secondary [facility]) Patient recently had a emergency room visit and admission secondary to shortness of breath, orthopnea, chest pain/pressure / HFrEF.  He had a cardiac catheterization which showed normal coronary arteries.  He had an echocardiogram which showed an EF of 25 to 30% Indicating an nonischemic cardiomyopathy.  Medication Adjustments/Labs and Tests Ordered: Current medicines are reviewed at length with the patient today.  Concerns regarding medicines are outlined above.   Disposition: Follow-up with Dr. Harl Bowie or APP 1 month  Signed, Levell July, NP 11/27/2019 3:51 PM    [place] [facility]. [place], [place], [place] [address] Phone: [phone]; Fax: [phone]

## 2019-11-27 ENCOUNTER — Encounter: Payer: Self-pay | Admitting: Family Medicine

## 2019-11-27 ENCOUNTER — Ambulatory Visit (INDEPENDENT_AMBULATORY_CARE_PROVIDER_SITE_OTHER): Payer: Medicare HMO | Admitting: Family Medicine

## 2019-11-27 ENCOUNTER — Other Ambulatory Visit: Payer: Self-pay

## 2019-11-27 VITALS — BP 158/76 | HR 96 | Ht 66.0 in | Wt 160.6 lb

## 2019-11-27 DIAGNOSIS — I5043 Acute on chronic combined systolic (congestive) and diastolic (congestive) heart failure: Secondary | ICD-10-CM

## 2019-11-27 DIAGNOSIS — I10 Essential (primary) hypertension: Secondary | ICD-10-CM | POA: Diagnosis not present

## 2019-11-27 DIAGNOSIS — I429 Cardiomyopathy, unspecified: Secondary | ICD-10-CM | POA: Diagnosis not present

## 2019-11-27 DIAGNOSIS — I453 Trifascicular block: Secondary | ICD-10-CM

## 2019-11-27 MED ORDER — ISOSORBIDE MONONITRATE ER 30 MG PO TB24: 15.0000 mg | ORAL_TABLET | Freq: Every day | ORAL | 1 refills | Status: DC

## 2019-11-27 MED ORDER — LOSARTAN POTASSIUM 25 MG PO TABS: 25.0000 mg | ORAL_TABLET | Freq: Every day | ORAL | 1 refills | Status: DC

## 2019-11-27 NOTE — Patient Instructions (Addendum)
Medication Instructions:   Your physician recommends that you continue on your current medications as directed. Please refer to the Current Medication list given to you today.  Labwork:  NONE  Testing/Procedures:  NONE  Follow-Up:  Your physician recommends that you schedule a follow-up appointment in: 2 weeks (office)  Any Other Special Instructions Will Be Listed Below (If Applicable).  If you need a refill on your cardiac medications before your next appointment, please call your pharmacy.

## 2019-12-10 NOTE — Progress Notes (Deleted)
Cardiology Office Note  Date: 12/10/2019   ID: AARIS YACKEL, DOB 20-Apr-1941, MRN [id number]  PCP:  Neale Burly, MD  Cardiologist:  Carlyle Dolly, MD Electrophysiologist:  None   Chief Complaint: F/U Chronic HFrEF, HTN, HLD, Bilateral PE  History of Present Illness: Gregory Mitchell is a 79 y.o. male with a history of  Chronic HFrEF, HTN, HLD, Bilateral PE, COPD  Recent hospital admission 08/31/2019 for complaints of dyspnea, orthopnea, and chest pressure.  He reported 4 to 5 days of worsening dyspnea and chest pressure associated with orthopnea.  He was admitted for acute on chronic systolic heart failure.  He received aggressive diuresis with IV furosemide with good response.  Cardiac catheterization on 09/03/2019 showed normal coronary arteries.  Echo showed reduced EF of 25 to 30% with LV global hypokinesis.  Last known EF in 2017 was 55 to 60%   He presents today with no particular complaints.  He denies any significant anginal or exertional symptoms.  Blood pressure is elevated on arrival 158/76..  Recheck on left arm was 140/80.  Patient states he has not taken any of his antihypertensive medications today.  Denies any lower extremity edema.  States he recently went to urgent care for upper respiratory infection and is now taking doxycycline and tapered steroid dosage.  States he was coughing up yellow-colored productive sputum.  States he is having some mild chest pain.  States his chest hurts usually at night but not associated with exertion denies any radiation or associated nausea, vomiting, diaphoresis.  He is asking for Viagra.  Past Medical History:  Diagnosis Date  . Asthma   . Chronic renal insufficiency   . COPD (chronic obstructive pulmonary disease) ([place])   . Hypertension   . Renal insufficiency   . Type 2 diabetes mellitus ([place])     Past Surgical History:  Procedure Laterality Date  . APPENDECTOMY    . CHOLECYSTECTOMY    . NASAL SINUS SURGERY    .  NEPHRECTOMY     RIGHT (Question CA.  No further therapy needed.)  . RIGHT/LEFT HEART CATH AND CORONARY ANGIOGRAPHY N/A 09/03/2019   Procedure: RIGHT/LEFT HEART CATH AND CORONARY ANGIOGRAPHY;  Surgeon: Lorretta Harp, MD;  Location: [facility];  Service: Cardiovascular;  Laterality: N/A;    Current Outpatient Medications  Medication Sig Dispense Refill  . albuterol (PROAIR HFA) 108 (90 BASE) MCG/ACT inhaler Inhale 2 puffs into the lungs every 6 (six) hours as needed for wheezing or shortness of breath.     [name] apixaban (ELIQUIS) 5 MG TABS tablet Take 5 mg by mouth 2 (two) times daily.    . carvedilol (COREG) 3.125 MG tablet TAKE ONE (1) TABLET (3.125 MG TOTAL) BY MOUTH TWO (2) TIMES DAILY WITH A MEAL.  60 tablet 6  . doxycycline (MONODOX) 100 MG capsule Take 100 mg by mouth 2 (two) times daily.    . furosemide (LASIX) 20 MG tablet Take 1 tablet (20 mg total) by mouth daily as needed for fluid or edema (Take in case of leg swelling or weight gain 3 lbs in 24 hours or 5 lbs in 7 days.). (Patient not taking: Reported on 11/27/2019) 30 tablet 0  . hydrALAZINE (APRESOLINE) 10 MG tablet TAKE ONE (1) TABLET (10 MG TOTAL) BY MOUTH EVERY 8 (EIGHT) HOURS.  90 tablet 3  . ipratropium-albuterol (DUONEB) 0.5-2.5 (3) MG/3ML SOLN Take 3 mLs by nebulization every 6 (six) hours as needed (for shortness of breath/wheezing).     [name]  isosorbide mononitrate (IMDUR) 30 MG 24 hr tablet Take 0.5 tablets (15 mg total) by mouth daily. 45 tablet 1  . losartan (COZAAR) 25 MG tablet Take 1 tablet (25 mg total) by mouth daily. 90 tablet 1  . metFORMIN (GLUCOPHAGE-XR) 500 MG 24 hr tablet Take 1,000 mg by mouth 2 (two) times daily.    . Omega-3 Fatty Acids (FISH OIL) 1000 MG CAPS Take 1 capsule by mouth 2 (two) times daily.     . OXYGEN Inhale 2 L into the lungs at bedtime as needed (for shortness of breath).    . predniSONE (DELTASONE) 10 MG tablet Take 10 mg by mouth daily.    . tamsulosin (FLOMAX) 0.4 MG CAPS capsule  Take 0.4 mg by mouth daily.   0   No current facility-administered medications for this visit.   Allergies:  Amoxicillin and Ciprofloxacin   Social History: The patient  reports that he quit smoking about 43 years ago. His smoking use included cigarettes. He started smoking about 62 years ago. He has a 15.00 pack-year smoking history. He quit smokeless tobacco use about 13 years ago.  His smokeless tobacco use included chew. He reports previous alcohol use. He reports that he does not use drugs.   Family History: The patient's family history includes Breast cancer in his mother; Lung cancer in his brother; Stroke in his mother.   ROS:  Please see the history of present illness. Otherwise, complete review of systems is positive for none.  All other systems are reviewed and negative.   Physical Exam: VS:  There were no vitals taken for this visit., BMI There is no height or weight on file to calculate BMI.  Wt Readings from Last 3 Encounters:  11/27/19 160 lb 9.6 oz (72.8 kg)  10/18/19 158 lb 12.8 oz (72 kg)  09/12/19 166 lb (75.3 kg)    General: Patient appears comfortable at rest. Neck: Supple, no elevated JVP or carotid bruits, no thyromegaly. Lungs: Mild crackles and prolonged expiratory phase, nonlabored breathing at rest. Cardiac: Regular rate and rhythm, no S3 or significant systolic murmur, no pericardial rub. Abdomen: Soft, nontender, no hepatomegaly, bowel sounds present, no guarding or rebound. Extremities: No pitting edema, distal pulses 2+. Skin: Warm and dry. Musculoskeletal: No kyphosis. Neuropsychiatric: Alert and oriented x3, affect grossly appropriate.  ECG:  September 04, 2019 EKG sinus rhythm with first-degree AV block with occasional PVCs and premature atrial complexes, LAD, first-degree AV block, right bundle branch block, T wave abnormality consider lateral ischemia.  Compared to previous tracing PAC and PVC are new.  Recent Labwork: 08/31/2019: B Natriuretic  Peptide 778.0; TSH 1.017 09/03/2019: ALT 11; AST 18; Hemoglobin 11.9; Platelets 277 09/12/2019: BUN 18; Creatinine, Ser 1.23; Magnesium 2.0; Potassium 4.7; Sodium 139  No results found for: CHOL, TRIG, HDL, CHOLHDL, VLDL, LDLCALC, LDLDIRECT  Other Studies Reviewed Today:  Cardiac Catheterization 09/03/2019  IMPRESSION: Mr. Axley has normal coronary arteries and low filling pressures suggesting that he has been adequately or over diuresed.  He did have a high V waves suggesting that he potentially has significant mitral regurgitation.  His systolic pressures were in the mid to high 90s and I therefore gave him a 250 cc bolus of saline given his low LVEDP and wedge.  He will need guideline directed optimal medical therapy for his LV dysfunction.  The sheath removed and a TR band was placed on the right wrist to achieve patent hemostasis.  The patient left lab in stable condition.  His  renal function be carefully monitored.  Dr. Davina Poke was notified of these results.   Echocardiogram 08/31/2019  1. Left ventricular ejection fraction, by visual estimation, is 25 to 30%. The left ventricle has severely decreased function. There is no left ventricular hypertrophy. 2. Elevated left ventricular end-diastolic pressure. 3. Left ventricular diastolic parameters are consistent with Grade II diastolic dysfunction (pseudonormalization). 4. Mild to moderately dilated left ventricular internal cavity size. 5. The left ventricle demonstrates global hypokinesis. 6. Global right ventricle has normal systolic function.The right ventricular size is normal. Mildly increased right ventricular wall thickness. 7. Left atrial size was severely dilated. 8. Right atrial size was moderately dilated. 9. The mitral valve is grossly normal. Mild mitral valve regurgitation. 10. The tricuspid valve is grossly normal. 11. The tricuspid valve is grossly normal. Tricuspid valve regurgitation is mild. 12. The aortic valve is  tricuspid. Aortic valve regurgitation is not visualized. No evidence of aortic valve sclerosis or stenosis. 13. The pulmonic valve was grossly normal. Pulmonic valve regurgitation is not visualized. 14. The inferior vena cava is normal in size with greater than 50% respiratory variability, suggesting right atrial pressure of 3 mmHg.     04/2013 echo Study Conclusions  - Left ventricle: The cavity size was mildly dilated. Wall thickness was increased in a pattern of mild to moderateLVH. Systolic function was mildly reduced. The estimated ejection fraction was in the range of 45% to 50%. There is mild global hypokinesis. There was an increased relative contribution of atrial contraction to ventricular filling. Doppler parameters are consistent with abnormal left ventricular relaxation (grade 1 diastolic dysfunction). Doppler parameters are consistent with high ventricular filling pressure. - Mitral valve: Calcified annulus. Mildly thickened leaflets . Trivial to mild regurgitation. - Left atrium: The atrium was mildly dilated. - Right ventricle: The cavity size was mildly dilated. Wall thickness was normal. Systolic function was mildly reduced. - Right atrium: The atrium was mildly dilated. - Pericardium, extracardiac: A trivial pericardial effusion was identified.   11/2011 Cath Procedural Findings:  Hemodynamics:  AO157/77 LV154/22  Coronary angiography:  Coronary dominance: Right  Left mainstem: Normal  Left anterior descending (LAD): Large vessel wrapping the apex. Mild lumina irregularities. D1 small normal. D2 small normal.  Left circumflex (LCx): AV group luminal irregularities. OM1 large and normal. PL x 2 small normal  Right coronary artery (RCA): Dominant. Long mid 25%. PDA normal. PL moderate and normal.   Left  ventriculography: Left ventricle not injected  Final Conclusions: Mild coronary plaque. Non ischemic cardiomyopathy  Recommendations: Medical management.     12/2014 Exercise Cardiolite IMPRESSION: 1. Moderate size region of inferior wall myocardial scar. No ischemic territories seen. Overlying soft tissue attenuation artifact cannot entirely be ruled out.  2. Mild inferior wall hypokinesis.  3. Left ventricular ejection fraction 48%  4. Low to intermediate-risk stress test findings*.   05/2016 echo Study Conclusions  - Left ventricle: The cavity size was normal. Wall thickness was increased in a pattern of mild LVH. Systolic function was normal. The estimated ejection fraction was in the range of 55% to 60%. Wall motion was normal; there were no regional wall motion abnormalities. Doppler parameters are consistent with abnormal left ventricular relaxation (grade 1 diastolic dysfunction). - Aortic valve: Mildly calcified annulus. Trileaflet; mildly thickened leaflets. Valve area (VTI): 4.03 cm^2. Valve area (Vmax): 4.25 cm^2. Valve area (Vmean): 4.49 cm^2. - Left atrium: The atrium was mildly to moderately dilated. - Atrial septum: No defect or patent foramen ovale was identified. - Technically adequate  study.    Assessment and Plan:  1. Essential hypertension Blood pressure is elevated on arrival today at 158/76.  Patient states he has not taken any of his antihypertensive medications.  Advised him to continue losartan 25 mg p.o. daily, hydralazine 10 mg every 8 hours,  2. Acute on chronic combined systolic (congestive) and diastolic (congestive) heart failure (HCC) Recent hospitalization for acute on chronic combined systolic and diastolic heart failure and received aggressive IV diuretic therapy.  Today's weight is 160 on arrival.  At discharge she was started on hydralazine and isosorbide for afterload reduction.  As needed furosemide  20 mg for lower extremity or weight gain of 3 pounds in 24 hours or 5 pounds in 7 days.  Continue hydralazine 10 mg p.o. every 8 hours, carvedilol 3.125 mg p.o. twice daily, Lasix as needed for lower extremity edema or weight gain of 3 pounds in 24 hours or 5 pounds in 7 days.  Patient has not been taking his Imdur.  Reordered Imdur 30 mg daily and advised patient he needs to take this medication.  Reorder losartan at 25 mg dosage.  If renal function stabilizes and blood pressure improves may need to start Entresto in the future if able and cost is not prohibitive to the patient.  3. Cardiomyopathy, secondary [facility]) Patient recently had a emergency room visit and admission secondary to shortness of breath, orthopnea, chest pain/pressure / HFrEF.  He had a cardiac catheterization which showed normal coronary arteries.  He had an echocardiogram which showed an EF of 25 to 30% Indicating an nonischemic cardiomyopathy.  Medication Adjustments/Labs and Tests Ordered: Current medicines are reviewed at length with the patient today.  Concerns regarding medicines are outlined above.   Disposition: Follow-up with Dr. Harl Bowie or APP 1 month  Signed, Levell July, NP 12/10/2019 10:10 PM    [facility] [place], [place], [place] [address] Phone: [phone]; Fax: [phone]

## 2019-12-11 ENCOUNTER — Ambulatory Visit: Payer: Medicare HMO | Admitting: Family Medicine

## 2019-12-11 DIAGNOSIS — I5043 Acute on chronic combined systolic (congestive) and diastolic (congestive) heart failure: Secondary | ICD-10-CM

## 2019-12-11 DIAGNOSIS — I429 Cardiomyopathy, unspecified: Secondary | ICD-10-CM

## 2019-12-11 DIAGNOSIS — I10 Essential (primary) hypertension: Secondary | ICD-10-CM

## 2019-12-12 ENCOUNTER — Ambulatory Visit: Payer: Medicare HMO | Admitting: Urology

## 2019-12-13 ENCOUNTER — Other Ambulatory Visit: Payer: Self-pay

## 2019-12-13 ENCOUNTER — Ambulatory Visit: Payer: Medicare HMO | Admitting: Family Medicine

## 2019-12-13 NOTE — Progress Notes (Deleted)
Cardiology Office Note  Date: 12/13/2019   ID: [name], DOB 09-03-1940, MRN [id number]  PCP:  Neale Burly, MD  Cardiologist:  Carlyle Dolly, MD Electrophysiologist:  None   Chief Complaint: F/U Chronic HFrEF, HTN, HLD, Bilateral PE  History of Present Illness: Gregory Mitchell is a 79 y.o. male with a history of  Chronic HFrEF, HTN, HLD, Bilateral PE, COPD  Recent hospital admission 08/31/2019 for complaints of dyspnea, orthopnea, and chest pressure.  He reported 4 to 5 days of worsening dyspnea and chest pressure associated with orthopnea.  He was admitted for acute on chronic systolic heart failure.  He received aggressive diuresis with IV furosemide with good response.  Cardiac catheterization on 09/03/2019 showed normal coronary arteries.  Echo showed reduced EF of 25 to 30% with LV global hypokinesis.  Last known EF in 2017 was 55 to 60%      Past Medical History:  Diagnosis Date  . Asthma   . Chronic renal insufficiency   . COPD (chronic obstructive pulmonary disease) ([place])   . Hypertension   . Renal insufficiency   . Type 2 diabetes mellitus ([place])     Past Surgical History:  Procedure Laterality Date  . APPENDECTOMY    . CHOLECYSTECTOMY    . NASAL SINUS SURGERY    . NEPHRECTOMY     RIGHT (Question CA.  No further therapy needed.)  . RIGHT/LEFT HEART CATH AND CORONARY ANGIOGRAPHY N/A 09/03/2019   Procedure: RIGHT/LEFT HEART CATH AND CORONARY ANGIOGRAPHY;  Surgeon: Lorretta Harp, MD;  Location: [facility];  Service: Cardiovascular;  Laterality: N/A;    Current Outpatient Medications  Medication Sig Dispense Refill  . albuterol (PROAIR HFA) 108 (90 BASE) MCG/ACT inhaler Inhale 2 puffs into the lungs every 6 (six) hours as needed for wheezing or shortness of breath.     [name] apixaban (ELIQUIS) 5 MG TABS tablet Take 5 mg by mouth 2 (two) times daily.    . carvedilol (COREG) 3.125 MG tablet TAKE ONE (1) TABLET (3.125 MG TOTAL) BY MOUTH TWO (2) TIMES  DAILY WITH A MEAL.  60 tablet 6  . doxycycline (MONODOX) 100 MG capsule Take 100 mg by mouth 2 (two) times daily.    . furosemide (LASIX) 20 MG tablet Take 1 tablet (20 mg total) by mouth daily as needed for fluid or edema (Take in case of leg swelling or weight gain 3 lbs in 24 hours or 5 lbs in 7 days.). (Patient not taking: Reported on 11/27/2019) 30 tablet 0  . hydrALAZINE (APRESOLINE) 10 MG tablet TAKE ONE (1) TABLET (10 MG TOTAL) BY MOUTH EVERY 8 (EIGHT) HOURS.  90 tablet 3  . ipratropium-albuterol (DUONEB) 0.5-2.5 (3) MG/3ML SOLN Take 3 mLs by nebulization every 6 (six) hours as needed (for shortness of breath/wheezing).     . isosorbide mononitrate (IMDUR) 30 MG 24 hr tablet Take 0.5 tablets (15 mg total) by mouth daily. 45 tablet 1  . losartan (COZAAR) 25 MG tablet Take 1 tablet (25 mg total) by mouth daily. 90 tablet 1  . metFORMIN (GLUCOPHAGE-XR) 500 MG 24 hr tablet Take 1,000 mg by mouth 2 (two) times daily.    . Omega-3 Fatty Acids (FISH OIL) 1000 MG CAPS Take 1 capsule by mouth 2 (two) times daily.     . OXYGEN Inhale 2 L into the lungs at bedtime as needed (for shortness of breath).    . predniSONE (DELTASONE) 10 MG tablet Take 10 mg by  mouth daily.    . tamsulosin (FLOMAX) 0.4 MG CAPS capsule Take 0.4 mg by mouth daily.   0   No current facility-administered medications for this visit.   Allergies:  Amoxicillin and Ciprofloxacin   Social History: The patient  reports that he quit smoking about 43 years ago. His smoking use included cigarettes. He started smoking about 62 years ago. He has a 15.00 pack-year smoking history. He quit smokeless tobacco use about 13 years ago.  His smokeless tobacco use included chew. He reports previous alcohol use. He reports that he does not use drugs.   Family History: The patient's family history includes Breast cancer in his mother; Lung cancer in his brother; Stroke in his mother.   ROS:  Please see the history of present illness. Otherwise,  complete review of systems is positive for none.  All other systems are reviewed and negative.   Physical Exam: VS:  There were no vitals taken for this visit., BMI There is no height or weight on file to calculate BMI.  Wt Readings from Last 3 Encounters:  11/27/19 160 lb 9.6 oz (72.8 kg)  10/18/19 158 lb 12.8 oz (72 kg)  09/12/19 166 lb (75.3 kg)    General: Patient appears comfortable at rest. Neck: Supple, no elevated JVP or carotid bruits, no thyromegaly. Lungs: Mild crackles and prolonged expiratory phase, nonlabored breathing at rest. Cardiac: Regular rate and rhythm, no S3 or significant systolic murmur, no pericardial rub. Abdomen: Soft, nontender, no hepatomegaly, bowel sounds present, no guarding or rebound. Extremities: No pitting edema, distal pulses 2+. Skin: Warm and dry. Musculoskeletal: No kyphosis. Neuropsychiatric: Alert and oriented x3, affect grossly appropriate.  ECG:  September 04, 2019 EKG sinus rhythm with first-degree AV block with occasional PVCs and premature atrial complexes, LAD, first-degree AV block, right bundle branch block, T wave abnormality consider lateral ischemia.  Compared to previous tracing PAC and PVC are new.  Recent Labwork: 08/31/2019: B Natriuretic Peptide 778.0; TSH 1.017 09/03/2019: ALT 11; AST 18; Hemoglobin 11.9; Platelets 277 09/12/2019: BUN 18; Creatinine, Ser 1.23; Magnesium 2.0; Potassium 4.7; Sodium 139  No results found for: CHOL, TRIG, HDL, CHOLHDL, VLDL, LDLCALC, LDLDIRECT  Other Studies Reviewed Today:  Cardiac Catheterization 09/03/2019  IMPRESSION: Gregory Mitchell has normal coronary arteries and low filling pressures suggesting that he has been adequately or over diuresed.  He did have a high V waves suggesting that he potentially has significant mitral regurgitation.  His systolic pressures were in the mid to high 90s and I therefore gave him a 250 cc bolus of saline given his low LVEDP and wedge.  He will need guideline directed  optimal medical therapy for his LV dysfunction.  The sheath removed and a TR band was placed on the right wrist to achieve patent hemostasis.  The patient left lab in stable condition.  His renal function be carefully monitored.  Dr. Davina Poke was notified of these results.   Echocardiogram 08/31/2019  1. Left ventricular ejection fraction, by visual estimation, is 25 to 30%. The left ventricle has severely decreased function. There is no left ventricular hypertrophy. 2. Elevated left ventricular end-diastolic pressure. 3. Left ventricular diastolic parameters are consistent with Grade II diastolic dysfunction (pseudonormalization). 4. Mild to moderately dilated left ventricular internal cavity size. 5. The left ventricle demonstrates global hypokinesis. 6. Global right ventricle has normal systolic function.The right ventricular size is normal. Mildly increased right ventricular wall thickness. 7. Left atrial size was severely dilated. 8. Right atrial size was moderately  dilated. 9. The mitral valve is grossly normal. Mild mitral valve regurgitation. 10. The tricuspid valve is grossly normal. 11. The tricuspid valve is grossly normal. Tricuspid valve regurgitation is mild. 12. The aortic valve is tricuspid. Aortic valve regurgitation is not visualized. No evidence of aortic valve sclerosis or stenosis. 13. The pulmonic valve was grossly normal. Pulmonic valve regurgitation is not visualized. 14. The inferior vena cava is normal in size with greater than 50% respiratory variability, suggesting right atrial pressure of 3 mmHg.   04/2013 echo Study Conclusions  - Left ventricle: The cavity size was mildly dilated. Wall thickness was increased in a pattern of mild to moderateLVH. Systolic function was mildly reduced. The estimated ejection fraction was in the range of 45% to 50%. There is mild global hypokinesis. There was an increased relative contribution of atrial  contraction to ventricular filling. Doppler parameters are consistent with abnormal left ventricular relaxation (grade 1 diastolic dysfunction). Doppler parameters are consistent with high ventricular filling pressure. - Mitral valve: Calcified annulus. Mildly thickened leaflets . Trivial to mild regurgitation. - Left atrium: The atrium was mildly dilated. - Right ventricle: The cavity size was mildly dilated. Wall thickness was normal. Systolic function was mildly reduced. - Right atrium: The atrium was mildly dilated. - Pericardium, extracardiac: A trivial pericardial effusion was identified.   11/2011 Cath Procedural Findings:  Hemodynamics: AO157/77 LV154/22  Coronary angiography:  Coronary dominance: Right Left mainstem: Normal Left anterior descending (LAD): Large vessel wrapping the apex. Mild lumina irregularities. D1 small normal. D2 small normal. Left circumflex (LCx): AV group luminal irregularities. OM1 large and normal. PL x 2 small normal Right coronary artery (RCA): Dominant. Long mid 25%. PDA normal. PL moderate and normal.  Left ventriculography: Left ventricle not injected Final Conclusions: Mild coronary plaque. Non ischemic cardiomyopathy Recommendations: Medical management.    12/2014 Exercise Cardiolite IMPRESSION: 1. Moderate size region of inferior wall myocardial scar. No ischemic territories seen. Overlying soft tissue attenuation artifact cannot entirely be ruled out. 2. Mild inferior wall hypokinesis. 3. Left ventricular ejection fraction 48% 4. Low to intermediate-risk stress test findings*.   05/2016 echo Study Conclusions  - Left ventricle: The cavity size was normal. Wall thickness was increased in a pattern of mild LVH. Systolic function was normal. The estimated ejection fraction was in the range of 55% to 60%. Wall motion was normal; there were no  regional wall motion abnormalities. Doppler parameters are consistent with abnormal left ventricular relaxation (grade 1 diastolic dysfunction). - Aortic valve: Mildly calcified annulus. Trileaflet; mildly thickened leaflets. Valve area (VTI): 4.03 cm^2. Valve area (Vmax): 4.25 cm^2. Valve area (Vmean): 4.49 cm^2. - Left atrium: The atrium was mildly to moderately dilated. - Atrial septum: No defect or patent foramen ovale was identified. - Technically adequate study.    Assessment and Plan:  1. Essential hypertension  2. Acute on chronic combined systolic (congestive) and diastolic (congestive) heart failure (HCC) Recent hospitalization for acute on chronic combined systolic and diastolic heart failure and received aggressive IV diuretic therapy.  Today's weight is 160 on arrival.  At discharge she was started on hydralazine and isosorbide for afterload reduction.  As needed furosemide 20 mg for lower extremity or weight gain of 3 pounds in 24 hours or 5 pounds in 7 days.  Continue hydralazine 10 mg p.o. every 8 hours, carvedilol 3.125 mg p.o. twice daily, Lasix as needed for lower extremity edema or weight gain of 3 pounds in 24 hours or 5 pounds in 7 days.  Patient has not been taking his Imdur.  Reordered Imdur 30 mg daily and advised patient he needs to take this medication.  Reorder losartan at 25 mg dosage.  If renal function stabilizes and blood pressure improves may need to start Entresto in the future if able and cost is not prohibitive to the patient.  3. Cardiomyopathy, secondary [facility]) Patient recently had a emergency room visit and admission secondary to shortness of breath, orthopnea, chest pain/pressure / HFrEF.  He had a cardiac catheterization which showed normal coronary arteries.  He had an echocardiogram which showed an EF of 25 to 30% Indicating an nonischemic cardiomyopathy.  Medication Adjustments/Labs and Tests Ordered: Current medicines are reviewed at length  with the patient today.  Concerns regarding medicines are outlined above.   Disposition: Follow-up with Dr. Harl Bowie or APP   Signed, Levell July, NP 12/13/2019 10:23 AM    [place] [facility]. [place], [place], [place] [address] Phone: [phone]; Fax: [phone]

## 2019-12-21 ENCOUNTER — Ambulatory Visit: Payer: Medicare HMO | Admitting: Cardiology

## 2020-01-01 NOTE — Progress Notes (Addendum)
Cardiology Office Note  Date: 01/03/2020   ID: IVAR PERSKY, DOB [birth year], MRN [id number]  PCP:  Neale Burly, MD  Cardiologist:  Carlyle Dolly, MD Electrophysiologist:  None   Chief Complaint: F/U Chronic HFrEF, HTN, HLD, Bilateral PE  History of Present Illness: JAVAR CASTELLANO is a 79 y.o. male with a history of  Chronic HFrEF, HTN, HLD, Bilateral PE, COPD, DM type II, chronic hypoxic respiratory failure, stage IIIa chronic kidney disease  Recent hospital admission 08/31/2019 for complaints of dyspnea, orthopnea, and chest pressure.  He reported 4 to 5 days of worsening dyspnea and chest pressure associated with orthopnea.  He was admitted for acute on chronic systolic heart failure.  He received aggressive diuresis with IV furosemide with good response.  Cardiac catheterization on 09/03/2019 showed normal coronary arteries.  Echo showed reduced EF of 25 to 30% with LV global hypokinesis.  Last known EF in 2017 was 55 to 60%  Patient is here today complaining of increasing episodes of chest pain usually in the morning states it feels like a tightness.  Comes and goes.  Denies any radiation or associated nausea, vomiting, or diaphoresis.  States is worse when he eats.  He denies any recent bleeding issues or black tarry stools.  He recently went to an urgent care clinic in [place] and received doxycycline for bronchitis-like infection.  States he is taking the medication for approximately 6 days and has between 4 and 6 days left on the course of antibiotics.  He states his shortness of breath is getting worse.  He denies any weight gain or lower extremity edema.  He was recently in the hospital in January for heart failure and received aggressive diuresis with IV furosemide.  His echocardiogram recently showed decrease in EF of 25 to 30% from a previous echo in 2017 showing EF of 55 to 60%.  He had a cardiac catheterization which showed normal coronary arteries.   Past  Medical History:  Diagnosis Date   Asthma    Chronic renal insufficiency    COPD (chronic obstructive pulmonary disease) (HCC)    Hypertension    Renal insufficiency    Type 2 diabetes mellitus ([place])     Past Surgical History:  Procedure Laterality Date   APPENDECTOMY     CHOLECYSTECTOMY     NASAL SINUS SURGERY     NEPHRECTOMY     RIGHT (Question CA.  No further therapy needed.)   RIGHT/LEFT HEART CATH AND CORONARY ANGIOGRAPHY N/A 09/03/2019   Procedure: RIGHT/LEFT HEART CATH AND CORONARY ANGIOGRAPHY;  Surgeon: Lorretta Harp, MD;  Location: [facility];  Service: Cardiovascular;  Laterality: N/A;    Current Outpatient Medications  Medication Sig Dispense Refill   albuterol (PROAIR HFA) 108 (90 BASE) MCG/ACT inhaler Inhale 2 puffs into the lungs every 6 (six) hours as needed for wheezing or shortness of breath.      apixaban (ELIQUIS) 5 MG TABS tablet Take 5 mg by mouth 2 (two) times daily.     doxycycline (MONODOX) 100 MG capsule Take 100 mg by mouth 2 (two) times daily.     furosemide (LASIX) 20 MG tablet Take 1 tablet (20 mg total) by mouth daily as needed for fluid or edema (Take in case of leg swelling or weight gain 3 lbs in 24 hours or 5 lbs in 7 days.). 30 tablet 0   hydrALAZINE (APRESOLINE) 10 MG tablet TAKE ONE (1) TABLET (10 MG TOTAL) BY MOUTH EVERY  8 (EIGHT) HOURS.  90 tablet 3   ipratropium-albuterol (DUONEB) 0.5-2.5 (3) MG/3ML SOLN Take 3 mLs by nebulization every 6 (six) hours as needed (for shortness of breath/wheezing).      isosorbide mononitrate (IMDUR) 30 MG 24 hr tablet Take 0.5 tablets (15 mg total) by mouth daily. 45 tablet 1   losartan (COZAAR) 25 MG tablet Take 1 tablet (25 mg total) by mouth daily. 90 tablet 1   metFORMIN (GLUCOPHAGE-XR) 500 MG 24 hr tablet Take 1,000 mg by mouth 2 (two) times daily.     Omega-3 Fatty Acids (FISH OIL) 1000 MG CAPS Take 1 capsule by mouth 2 (two) times daily.      OXYGEN Inhale 2 L into the  lungs at bedtime as needed (for shortness of breath).     tamsulosin (FLOMAX) 0.4 MG CAPS capsule Take 0.4 mg by mouth daily.   0   carvedilol (COREG) 3.125 MG tablet TAKE ONE (1) TABLET (3.125 MG TOTAL) BY MOUTH TWO (2) TIMES DAILY WITH A MEAL.  (Patient not taking: Reported on 01/03/2020) 60 tablet 6   No current facility-administered medications for this visit.   Allergies:  Amoxicillin and Ciprofloxacin   Social History: The patient  reports that he quit smoking about 43 years ago. His smoking use included cigarettes. He started smoking about 62 years ago. He has a 15.00 pack-year smoking history. He quit smokeless tobacco use about 13 years ago.  His smokeless tobacco use included chew. He reports previous alcohol use. He reports that he does not use drugs.   Family History: The patient's family history includes Breast cancer in his mother; Lung cancer in his brother; Stroke in his mother.   ROS:  Please see the history of present illness. Otherwise, complete review of systems is positive for none.  All other systems are reviewed and negative.   Physical Exam: VS:  BP 136/70    Pulse 78    Ht 5\' 6"  (1.676 m)    Wt 158 lb 3.2 oz (71.8 kg)    SpO2 97%    BMI 25.53 kg/m , BMI Body mass index is 25.53 kg/m.  Wt Readings from Last 3 Encounters:  01/03/20 158 lb 3.2 oz (71.8 kg)  11/27/19 160 lb 9.6 oz (72.8 kg)  10/18/19 158 lb 12.8 oz (72 kg)    General: Patient appears comfortable at rest. Neck: Supple, no elevated JVP or carotid bruits, no thyromegaly. Lungs: Posterior crackles throughout all lung fields bilaterally and prolonged expiratory phase, nonlabored breathing at rest, cough productive of yellow-colored sputum. Cardiac: Regular rate and rhythm, no S3 or significant systolic murmur, no pericardial rub. Extremities: No pitting edema, distal pulses 2+. Skin: Warm and dry. Musculoskeletal: No kyphosis. Neuropsychiatric: Alert and oriented x3, affect grossly appropriate.  ECG:  EKG 01/03/2020, sinus rhythm with premature atrial complexes with aberrant conduction rate of 90, possible left atrial enlargement, left axis deviation, right bundle branch block, T wave abnormality consider lateral ischemia.  Recent Labwork: 08/31/2019: B Natriuretic Peptide 778.0; TSH 1.017 09/03/2019: ALT 11; AST 18; Hemoglobin 11.9; Platelets 277 09/12/2019: BUN 18; Creatinine, Ser 1.23; Magnesium 2.0; Potassium 4.7; Sodium 139  No results found for: CHOL, TRIG, HDL, CHOLHDL, VLDL, LDLCALC, LDLDIRECT  Other Studies Reviewed Today:  Cardiac Catheterization 09/03/2019  IMPRESSION: Mr. Conto has normal coronary arteries and low filling pressures suggesting that he has been adequately or over diuresed.  He did have a high V waves suggesting that he potentially has significant mitral regurgitation.  His systolic  pressures were in the mid to high 90s and I therefore gave him a 250 cc bolus of saline given his low LVEDP and wedge.  He will need guideline directed optimal medical therapy for his LV dysfunction.  The sheath removed and a TR band was placed on the right wrist to achieve patent hemostasis.  The patient left lab in stable condition.  His renal function be carefully monitored.  Dr. Davina Poke was notified of these results.   Echocardiogram 08/31/2019  1. Left ventricular ejection fraction, by visual estimation, is 25 to 30%. The left ventricle has severely decreased function. There is no left ventricular hypertrophy. 2. Elevated left ventricular end-diastolic pressure. 3. Left ventricular diastolic parameters are consistent with Grade II diastolic dysfunction (pseudonormalization). 4. Mild to moderately dilated left ventricular internal cavity size. 5. The left ventricle demonstrates global hypokinesis. 6. Global right ventricle has normal systolic function.The right ventricular size is normal. Mildly increased right ventricular wall thickness. 7. Left atrial size was severely  dilated. 8. Right atrial size was moderately dilated. 9. The mitral valve is grossly normal. Mild mitral valve regurgitation. 10. The tricuspid valve is grossly normal. 11. The tricuspid valve is grossly normal. Tricuspid valve regurgitation is mild. 12. The aortic valve is tricuspid. Aortic valve regurgitation is not visualized. No evidence of aortic valve sclerosis or stenosis. 13. The pulmonic valve was grossly normal. Pulmonic valve regurgitation is not visualized. 14. The inferior vena cava is normal in size with greater than 50% respiratory variability, suggesting right atrial pressure of 3 mmHg.   04/2013 echo Study Conclusions  - Left ventricle: The cavity size was mildly dilated. Wall thickness was increased in a pattern of mild to moderateLVH. Systolic function was mildly reduced. The estimated ejection fraction was in the range of 45% to 50%. There is mild global hypokinesis. There was an increased relative contribution of atrial contraction to ventricular filling. Doppler parameters are consistent with abnormal left ventricular relaxation (grade 1 diastolic dysfunction). Doppler parameters are consistent with high ventricular filling pressure. - Mitral valve: Calcified annulus. Mildly thickened leaflets . Trivial to mild regurgitation. - Left atrium: The atrium was mildly dilated. - Right ventricle: The cavity size was mildly dilated. Wall thickness was normal. Systolic function was mildly reduced. - Right atrium: The atrium was mildly dilated. - Pericardium, extracardiac: A trivial pericardial effusion was identified.   11/2011 Cath Procedural Findings:  Hemodynamics: AO157/77 LV154/22  Coronary angiography:  Coronary dominance: Right Left mainstem: Normal Left anterior descending (LAD): Large vessel wrapping the apex. Mild lumina irregularities. D1 small normal. D2 small normal. Left circumflex  (LCx): AV group luminal irregularities. OM1 large and normal. PL x 2 small normal Right coronary artery (RCA): Dominant. Long mid 25%. PDA normal. PL moderate and normal.  Left ventriculography: Left ventricle not injected Final Conclusions: Mild coronary plaque. Non ischemic cardiomyopathy Recommendations: Medical management.    12/2014 Exercise Cardiolite IMPRESSION: 1. Moderate size region of inferior wall myocardial scar. No ischemic territories seen. Overlying soft tissue attenuation artifact cannot entirely be ruled out. 2. Mild inferior wall hypokinesis. 3. Left ventricular ejection fraction 48% 4. Low to intermediate-risk stress test findings*.   05/2016 echo Study Conclusions  - Left ventricle: The cavity size was normal. Wall thickness was increased in a pattern of mild LVH. Systolic function was normal. The estimated ejection fraction was in the range of 55% to 60%. Wall motion was normal; there were no regional wall motion abnormalities. Doppler parameters are consistent with abnormal left ventricular relaxation (grade  1 diastolic dysfunction). - Aortic valve: Mildly calcified annulus. Trileaflet; mildly thickened leaflets. Valve area (VTI): 4.03 cm^2. Valve area (Vmax): 4.25 cm^2. Valve area (Vmean): 4.49 cm^2. - Left atrium: The atrium was mildly to moderately dilated. - Atrial septum: No defect or patent foramen ovale was identified. - Technically adequate study.    Assessment and Plan:  1. Essential hypertension Blood pressure appears to be reasonably well controlled on current medication.  Continue, hydralazine 10 mg every 8 hours, losartan 25 mg daily.   2.  Chest pain. Patient continues to complain of chest pain states when he wakes up in the morning or when he eats the chest pain becomes worse.  No aggravating or alleviating factors other than when eating.  Please get troponin I.  Please refer patient to GI for  evaluation.    3. Acute on chronic combined systolic (congestive) and diastolic (congestive) heart failure ([place]) Patient denies any recent increase in weight or lower extremity edema.  He has  some shortness of breath and recently seen by urgent care and started on doxycycline.  He does have some crackles throughout posterior lung fields. Please get a chest x-ray, BNP Continue asneeded furosemide 20 mg for lower extremity or weight gain of 3 pounds in 24 hours or 5 pounds in 7 days.  Continue hydralazine 10 mg p.o. every 8 hours, carvedilol 3.125 mg p.o. twice daily, Lasix as needed for lower extremity edema or weight gain of 3 pounds in 24 hours or 5 pounds in 7 days.  If renal function stabilizes and blood pressure improves may need to start Entresto in the future if able and cost is not prohibitive to the patient.  Patient states he needs to stop his Imdur believing it is causing him chest pain.  5. Cardiomyopathy, secondary [facility]) Patient recently had a emergency room visit and admission secondary to shortness of breath, orthopnea, chest pain/pressure / HFrEF.  He had a cardiac catheterization which showed normal coronary arteries.  He had an echocardiogram which showed an EF of 25 to 30% Indicating a nonischemic cardiomyopathy.   6.  Fatigue, low energy. Patient states he is having increased fatigue and believes his blood may be low.  He denies any active bleeding, or dark tarry stools.  Please get a CBC, TSH, T3, T4.  Medication Adjustments/Labs and Tests Ordered: Current medicines are reviewed at length with the patient today.  Concerns regarding medicines are outlined above.   Disposition: Follow-up with Dr. Harl Bowie or APP 1 month  Signed, Levell July, NP 01/03/2020 8:36 AM    [place] [facility]. [address], [place], [place] [address] Phone: [phone]; Fax: [phone]

## 2020-01-03 ENCOUNTER — Other Ambulatory Visit: Payer: Self-pay

## 2020-01-03 ENCOUNTER — Ambulatory Visit (INDEPENDENT_AMBULATORY_CARE_PROVIDER_SITE_OTHER): Payer: Medicare HMO | Admitting: Family Medicine

## 2020-01-03 ENCOUNTER — Telehealth: Payer: Self-pay | Admitting: *Deleted

## 2020-01-03 ENCOUNTER — Encounter: Payer: Self-pay | Admitting: Family Medicine

## 2020-01-03 ENCOUNTER — Ambulatory Visit (HOSPITAL_COMMUNITY)
Admission: RE | Admit: 2020-01-03 | Discharge: 2020-01-03 | Disposition: A | Discharge status: Home / Self Care | Payer: Medicare HMO | Source: Ambulatory Visit | Attending: Family Medicine | Admitting: Family Medicine

## 2020-01-03 ENCOUNTER — Other Ambulatory Visit (HOSPITAL_COMMUNITY)
Admission: RE | Admit: 2020-01-03 | Discharge: 2020-01-03 | Disposition: A | Discharge status: Home / Self Care | Payer: Medicare HMO | Source: Ambulatory Visit | Attending: Family Medicine | Admitting: Family Medicine

## 2020-01-03 VITALS — BP 136/70 | HR 90 | Ht 66.0 in | Wt 158.2 lb

## 2020-01-03 DIAGNOSIS — R079 Chest pain, unspecified: Secondary | ICD-10-CM | POA: Diagnosis not present

## 2020-01-03 DIAGNOSIS — R5383 Other fatigue: Secondary | ICD-10-CM | POA: Insufficient documentation

## 2020-01-03 DIAGNOSIS — I10 Essential (primary) hypertension: Secondary | ICD-10-CM

## 2020-01-03 DIAGNOSIS — I429 Cardiomyopathy, unspecified: Secondary | ICD-10-CM | POA: Diagnosis not present

## 2020-01-03 DIAGNOSIS — I5043 Acute on chronic combined systolic (congestive) and diastolic (congestive) heart failure: Secondary | ICD-10-CM | POA: Diagnosis not present

## 2020-01-03 DIAGNOSIS — R918 Other nonspecific abnormal finding of lung field: Secondary | ICD-10-CM | POA: Insufficient documentation

## 2020-01-03 DIAGNOSIS — R0602 Shortness of breath: Secondary | ICD-10-CM | POA: Insufficient documentation

## 2020-01-03 DIAGNOSIS — R1013 Epigastric pain: Secondary | ICD-10-CM

## 2020-01-03 LAB — CBC
HCT: 36.6 % — ABNORMAL LOW (ref 39.0–52.0)
Hemoglobin: 10.4 g/dL — ABNORMAL LOW (ref 13.0–17.0)
MCH: 26.3 pg (ref 26.0–34.0)
MCHC: 28.4 g/dL — ABNORMAL LOW (ref 30.0–36.0)
MCV: 92.7 fL (ref 80.0–100.0)
Platelets: 252 10*3/uL (ref 150–400)
RBC: 3.95 MIL/uL — ABNORMAL LOW (ref 4.22–5.81)
RDW: 14.6 % (ref 11.5–15.5)
WBC: 5.7 10*3/uL (ref 4.0–10.5)
nRBC: 0 % (ref 0.0–0.2)

## 2020-01-03 LAB — T4, FREE: Free T4: 1.17 ng/dL — ABNORMAL HIGH (ref 0.61–1.12)

## 2020-01-03 LAB — TROPONIN I (HIGH SENSITIVITY): Troponin I (High Sensitivity): 36 ng/L — ABNORMAL HIGH (ref <18)

## 2020-01-03 LAB — BRAIN NATRIURETIC PEPTIDE: B Natriuretic Peptide: 1273 pg/mL — ABNORMAL HIGH (ref 0.0–100.0)

## 2020-01-03 MED ORDER — CARVEDILOL 3.125 MG PO TABS: ORAL_TABLET | ORAL | 3 refills | Status: DC

## 2020-01-03 NOTE — Patient Instructions (Addendum)
Medication Instructions:   Your physician has recommended you make the following change in your medication:   Stop isosorbide mononitrate  Continue other medications the same  Labwork:  Your physician recommends that you return for lab work in: TODAY to check your TSH, Free T3 &4, CBC, BNP & Troponin levels. This may be done at [facility]. No appointment is needed.  Testing/Procedures:  NONE  Follow-Up:  Your physician recommends that you schedule a follow-up appointment in: 1 month (office)  Any Other Special Instructions Will Be Listed Below (If Applicable).  You have been referred to a gastroenterologist  If you need a refill on your cardiac medications before your next appointment, please call your pharmacy.

## 2020-01-04 LAB — T3, FREE: T3, Free: 2 pg/mL (ref 2.0–4.4)

## 2020-01-04 NOTE — Telephone Encounter (Signed)
-----   Message from Verta Ellen., NP sent at 01/03/2020  5:02 PM EDT ----- Hey Mr. Tester's BNP is 1200.  We need to increase his Lasix to 20 mg twice a day for the next week and have him come back a little sooner like in the next 2 weeks if his breathing is not better.  We also need to do a follow-up BMP and magnesium when he returns.  Please call him and let him know.  Thanks

## 2020-01-04 NOTE — Telephone Encounter (Signed)
-----   Message from Verta Ellen., NP sent at 01/04/2020 12:13 PM EDT ----- Please call the patient and tell him the chest xray shows evidence of acute bronchitis/copd and possibility of pulmonary edema or atypical viral pneumonia but these were felt to be less likely. He has chronic hyperinflation of his lungs usually seen in patient with COPD/ Emphysema.

## 2020-01-08 ENCOUNTER — Encounter: Payer: Self-pay | Admitting: Gastroenterology

## 2020-01-08 ENCOUNTER — Encounter: Payer: Self-pay | Admitting: *Deleted

## 2020-01-08 ENCOUNTER — Telehealth: Payer: Self-pay | Admitting: Family Medicine

## 2020-01-08 MED ORDER — FUROSEMIDE 20 MG PO TABS: 20.0000 mg | ORAL_TABLET | Freq: Two times a day (BID) | ORAL | 0 refills | Status: DC

## 2020-01-08 NOTE — Telephone Encounter (Signed)
Patient called asking for results from Xray

## 2020-01-08 NOTE — Telephone Encounter (Signed)
Letter mailed

## 2020-01-08 NOTE — Telephone Encounter (Signed)
Patient informed and verbalized understanding plan. Advised to contact his pulmonologist (Dr. Koleen Nimrod) also Copy sent to PCP.

## 2020-01-09 ENCOUNTER — Telehealth: Payer: Self-pay | Admitting: *Deleted

## 2020-01-09 NOTE — Telephone Encounter (Signed)
Agree with your assessment and plan  J Kylynn Street MD 

## 2020-01-09 NOTE — Telephone Encounter (Signed)
Reports feeling tired and sluggish today. Reports being up all night voiding. Reports chest pain and sob is better. Denies dizziness. Reports not drinking a lot of fluids. Advised to increase fluid intake. Advised to take his second dose of lasix 20 mg around 1-2 pm. Advised in eat some bananas and drink orange juice. Advised if symptoms get worse, to go to the ED for an evaluation.

## 2020-01-16 NOTE — Progress Notes (Signed)
Cardiology Office Note  Date: 01/17/2020   ID: Gregory Mitchell, DOB [birth year], MRN [id number]  PCP:  Neale Burly, MD  Cardiologist:  Carlyle Dolly, MD Electrophysiologist:  None   Chief Complaint: Follow-up chronic HFrEF, HTN, HLD, bilateral PE  History of Present Illness: Gregory Mitchell is a 79 y.o. male with a history of  chronic HFrEF, HTN, HLD, bilateral PE, COPD, DM type II, chronic hypoxic respiratory failure, stage IIIa chronic kidney disease  Hospital admission on 08/31/2019 for complaints of dyspnea, orthopnea, chest pressure.  Admitted with acute on chronic systolic heart failure.  Received aggressive diuresis with IV furosemide with good response.  Cardiac catheterization on 09/03/2019 showed normal coronary arteries, echo showed reduced EF of 25 to 30% with LV global hypokinesis.  Previous echo in 2017 showed EF 55 to 60%.   Last seen by me on 01/03/2019 complaining of increasing episodes of chest pain usually in the morning it feels like a tightness.  Stated it comes and goes.  He went to an urgent care facility in [place] and was prescribed doxycycline for bronchitis-like infection.  He stated his shortness of breath was getting worse.  He denied any weight gain or lower extremity edema.  Chest x-ray was ordered along with BNP. BNP was elevated at 1200.  His Lasix was increased to 20 mg twice a day for the next week.  Chest x-ray showed evidence of acute bronchitis, COPD and possibility of pulmonary edema or atypical viral pneumonia but these were felt to be less likely.  Had chronic hyperinflation of the lungs impression was COPD/emphysema.  He called on 01/09/2020 reporting feeling tired and sluggish that day.  He stated his chest pain and shortness of breath were better.  He denied any dizziness but reported not drinking a lot of fluids.  He was advised to increase fluid intake and to take his second dose of Lasix around 1-2 PM and eat some bananas and drink orange juice  and if symptoms became worse to go to the emergency room for an evaluation.   Patient states his chest pain and shortness of breath have improved.  States he just feels tired all the time.  He says his allergies are bothering his breathing.  States he gets up in the morning and does not initial nebulizer treatment every morning due to feeling short of breath.  States his primary care provider just started him on doxycycline and prednisone for respiratory infection.  States he is feeling a little better since he started the antibiotic.  Otherwise he states he just feels tired all the time.  He has an appointment with GI on July 14.  He has some recent lab work which showed some anemia with hemoglobin of 10.4 and hematocrit 36.6.  He denies any bleeding issues.  States he does have occasional black stools.  Past Medical History:  Diagnosis Date  . Asthma   . Chronic renal insufficiency   . COPD (chronic obstructive pulmonary disease) ([facility])   . Hypertension   . Renal insufficiency   . Type 2 diabetes mellitus ([place])     Past Surgical History:  Procedure Laterality Date  . APPENDECTOMY    . CHOLECYSTECTOMY    . NASAL SINUS SURGERY    . NEPHRECTOMY     RIGHT (Question CA.  No further therapy needed.)  . RIGHT/LEFT HEART CATH AND CORONARY ANGIOGRAPHY N/A 09/03/2019   Procedure: RIGHT/LEFT HEART CATH AND CORONARY ANGIOGRAPHY;  Surgeon: Lorretta Harp, MD;  Location: [facility];  Service: Cardiovascular;  Laterality: N/A;    Current Outpatient Medications  Medication Sig Dispense Refill  . albuterol (PROAIR HFA) 108 (90 BASE) MCG/ACT inhaler Inhale 2 puffs into the lungs every 6 (six) hours as needed for wheezing or shortness of breath.     [name] apixaban (ELIQUIS) 5 MG TABS tablet Take 5 mg by mouth 2 (two) times daily.    . carvedilol (COREG) 3.125 MG tablet TAKE ONE (1) TABLET (3.125 MG TOTAL) BY MOUTH TWO (2) TIMES DAILY WITH A MEAL. 180 tablet 3  . doxycycline (MONODOX) 100 MG  capsule Take 100 mg by mouth 2 (two) times daily.    . furosemide (LASIX) 20 MG tablet Take 1 tablet (20 mg total) by mouth 2 (two) times daily. For the next week, then reduce to previous dose (Patient taking differently: Take 20 mg by mouth daily. ) 30 tablet 0  . ipratropium-albuterol (DUONEB) 0.5-2.5 (3) MG/3ML SOLN Take 3 mLs by nebulization every 6 (six) hours as needed (for shortness of breath/wheezing).     [name] losartan (COZAAR) 25 MG tablet Take 1 tablet (25 mg total) by mouth daily. 90 tablet 1  . metFORMIN (GLUCOPHAGE-XR) 500 MG 24 hr tablet Take 1,000 mg by mouth 2 (two) times daily.    . Omega-3 Fatty Acids (FISH OIL) 1000 MG CAPS Take 1 capsule by mouth 2 (two) times daily.     . OXYGEN Inhale 2 L into the lungs at bedtime as needed (for shortness of breath).    . tamsulosin (FLOMAX) 0.4 MG CAPS capsule Take 0.4 mg by mouth daily.   0  . hydrALAZINE (APRESOLINE) 10 MG tablet TAKE ONE (1) TABLET (10 MG TOTAL) BY MOUTH EVERY 8 (EIGHT) HOURS. 270 tablet 3   No current facility-administered medications for this visit.   Allergies:  Amoxicillin and Ciprofloxacin   Social History: The patient  reports that he quit smoking about 43 years ago. His smoking use included cigarettes. He started smoking about 62 years ago. He has a 15.00 pack-year smoking history. He quit smokeless tobacco use about 13 years ago.  His smokeless tobacco use included chew. He reports previous alcohol use. He reports that he does not use drugs.   Family History: The patient's family history includes Breast cancer in his mother; Lung cancer in his brother; Stroke in his mother.   ROS:  Please see the history of present illness. Otherwise, complete review of systems is positive for none.  All other systems are reviewed and negative.   Physical Exam: VS:  BP 126/64   Pulse 84   Ht 5\' 6"  (1.676 m)   Wt 154 lb 12.8 oz (70.2 kg)   SpO2 97%   BMI 24.99 kg/m , BMI Body mass index is 24.99 kg/m.  Wt Readings from  Last 3 Encounters:  01/17/20 154 lb 12.8 oz (70.2 kg)  01/03/20 158 lb 3.2 oz (71.8 kg)  11/27/19 160 lb 9.6 oz (72.8 kg)    General: Patient appears comfortable at rest. Neck: Supple, no elevated JVP or carotid bruits, no thyromegaly. Lungs: Diminished sounds in bases, mild crackles in the left upper posterior lung field.  Mild expiratory wheezing heard and right anterior lung., nonlabored breathing at rest. Cardiac: Regular rate and rhythm, no S3 or significant systolic murmur, no pericardial rub. Extremities: No pitting edema, distal pulses 2+. Skin: Warm and dry. Musculoskeletal: No kyphosis. Neuropsychiatric: Alert and oriented x3, affect grossly appropriate.  ECG:  EKG 08/31/2019 normal  sinus rhythm rate of 81, borderline prolonged PR interval, right pleural branch block and left anterior fascicular block, probable LVH.  Recent Labwork: 08/31/2019: TSH 1.017 09/03/2019: ALT 11; AST 18 09/12/2019: BUN 18; Creatinine, Ser 1.23; Magnesium 2.0; Potassium 4.7; Sodium 139 01/03/2020: B Natriuretic Peptide 1,273.0; Hemoglobin 10.4; Platelets 252  No results found for: CHOL, TRIG, HDL, CHOLHDL, VLDL, LDLCALC, LDLDIRECT  Other Studies Reviewed Today:  Cardiac Catheterization 09/03/2019  IMPRESSION:Mr. Sheeler has normal coronary arteries and low filling pressures suggesting that he has been adequately or over diuresed. He did have a high V waves suggesting that he potentially has significant mitral regurgitation. His systolic pressures were in the mid to high 90s and I therefore gave him a 250 cc bolus of saline given his low LVEDP and wedge. He will need guideline directed optimal medical therapy for his LV dysfunction. The sheath removed and a TR band was placed on the right wrist to achieve patent hemostasis. The patient left lab in stable condition. His renal function be carefully monitored. Dr. Davina Poke was notified of these results.   Echocardiogram 08/31/2019  1. Left  ventricular ejection fraction, by visual estimation, is 25 to 30%. The left ventricle has severely decreased function. There is no left ventricular hypertrophy. 2. Elevated left ventricular end-diastolic pressure. 3. Left ventricular diastolic parameters are consistent with Grade II diastolic dysfunction (pseudonormalization). 4. Mild to moderately dilated left ventricular internal cavity size. 5. The left ventricle demonstrates global hypokinesis. 6. Global right ventricle has normal systolic function.The right ventricular size is normal. Mildly increased right ventricular wall thickness. 7. Left atrial size was severely dilated. 8. Right atrial size was moderately dilated. 9. The mitral valve is grossly normal. Mild mitral valve regurgitation. 10. The tricuspid valve is grossly normal. 11. The tricuspid valve is grossly normal. Tricuspid valve regurgitation is mild. 12. The aortic valve is tricuspid. Aortic valve regurgitation is not visualized. No evidence of aortic valve sclerosis or stenosis. 13. The pulmonic valve was grossly normal. Pulmonic valve regurgitation is not visualized. 14. The inferior vena cava is normal in size with greater than 50% respiratory variability, suggesting right atrial pressure of 3 mmHg.   04/2013 echo Study Conclusions  - Left ventricle: The cavity size was mildly dilated. Wall thickness was increased in a pattern of mild to moderateLVH. Systolic function was mildly reduced. The estimated ejection fraction was in the range of 45% to 50%. There is mild global hypokinesis. There was an increased relative contribution of atrial contraction to ventricular filling. Doppler parameters are consistent with abnormal left ventricular relaxation (grade 1 diastolic dysfunction). Doppler parameters are consistent with high ventricular filling pressure. - Mitral valve: Calcified annulus. Mildly thickened leaflets . Trivial to mild  regurgitation. - Left atrium: The atrium was mildly dilated. - Right ventricle: The cavity size was mildly dilated. Wall thickness was normal. Systolic function was mildly reduced. - Right atrium: The atrium was mildly dilated. - Pericardium, extracardiac: A trivial pericardial effusion was identified.   11/2011 Cath Procedural Findings:  Hemodynamics: AO157/77 LV154/22  Coronary angiography:  Coronary dominance: Right Left mainstem: Normal Left anterior descending (LAD): Large vessel wrapping the apex. Mild lumina irregularities. D1 small normal. D2 small normal. Left circumflex (LCx): AV group luminal irregularities. OM1 large and normal. PL x 2 small normal Right coronary artery (RCA): Dominant. Long mid 25%. PDA normal. PL moderate and normal.  Left ventriculography: Left ventricle not injected Final Conclusions: Mild coronary plaque. Non ischemic cardiomyopathy Recommendations: Medical management.    12/2014 Exercise  Cardiolite IMPRESSION: 1. Moderate size region of inferior wall myocardial scar. No ischemic territories seen. Overlying soft tissue attenuation artifact cannot entirely be ruled out. 2. Mild inferior wall hypokinesis. 3. Left ventricular ejection fraction 48% 4. Low to intermediate-risk stress test findings*.   05/2016 echo Study Conclusions  - Left ventricle: The cavity size was normal. Wall thickness was increased in a pattern of mild LVH. Systolic function was normal. The estimated ejection fraction was in the range of 55% to 60%. Wall motion was normal; there were no regional wall motion abnormalities. Doppler parameters are consistent with abnormal left ventricular relaxation (grade 1 diastolic dysfunction). - Aortic valve: Mildly calcified annulus. Trileaflet; mildly thickened leaflets. Valve area (VTI): 4.03 cm^2. Valve area (Vmax): 4.25 cm^2. Valve area  (Vmean): 4.49 cm^2. - Left atrium: The atrium was mildly to moderately dilated. - Atrial septum: No defect or patent foramen ovale was identified. - Technically adequate study.  Assessment and Plan:  1. Essential hypertension   2. Acute on chronic combined systolic (congestive) and diastolic (congestive) heart failure (HCC)   3. Chest pain, unspecified type   4. Other fatigue    1. Essential hypertension Blood pressure 126/64 today.  Continue hydralazine 10 mg every 8 hours.  Continue losartan 25 mg daily.  2. Acute on chronic combined systolic (congestive) and diastolic (congestive) heart failure (HCC) Recently increased Lasix to 20 mg p.o. twice daily for 2 weeks due to increased shortness of breath and elevated BNP of 1200.  He is back down to Lasix 20 mg daily now.  States shortness of breath and chest pain improved after increasing Lasix dosage.  Get BMP mag to check renal function.  Continue Lasix 20 mg daily, carvedilol 3.125 mg p.o. twice daily.  3. Chest pain, unspecified type Patient states his chest pain has resolved since going up on the Lasix for 2 weeks.  He is back on his normal dosage.  He states his primary care provider just started him on doxycycline and prednisone for shortness of breath and possible upper respiratory infection.  He has a significant history of COPD and sees pulmonology in [place] Dr. Linde Gillis.  4. Other fatigue Recent complaints of fatigue and continued complaints of fatigue on arrival today.  We drew a thyroid profile, CBC.  Hemoglobin and hematocrit were 10.4 and 36.6.  Patient has an appointment with gastroenterology on July 14.  Medication Adjustments/Labs and Tests Ordered: Current medicines are reviewed at length with the patient today.  Concerns regarding medicines are outlined above.   Disposition: Follow-up with Dr. Harl Bowie or APP 6 months  Signed, Levell July, NP 01/17/2020 10:12 AM    [facility] [place], [place], [place] [address] Phone: [phone]; Fax: [phone]

## 2020-01-17 ENCOUNTER — Encounter: Payer: Self-pay | Admitting: Family Medicine

## 2020-01-17 ENCOUNTER — Other Ambulatory Visit: Payer: Self-pay

## 2020-01-17 ENCOUNTER — Ambulatory Visit: Payer: Medicare HMO | Admitting: Family Medicine

## 2020-01-17 VITALS — BP 126/64 | HR 84 | Ht 66.0 in | Wt 154.8 lb

## 2020-01-17 DIAGNOSIS — R5383 Other fatigue: Secondary | ICD-10-CM

## 2020-01-17 DIAGNOSIS — R079 Chest pain, unspecified: Secondary | ICD-10-CM | POA: Diagnosis not present

## 2020-01-17 DIAGNOSIS — I10 Essential (primary) hypertension: Secondary | ICD-10-CM

## 2020-01-17 DIAGNOSIS — I5043 Acute on chronic combined systolic (congestive) and diastolic (congestive) heart failure: Secondary | ICD-10-CM

## 2020-01-17 MED ORDER — HYDRALAZINE HCL 10 MG PO TABS: ORAL_TABLET | ORAL | 3 refills | Status: DC

## 2020-01-17 NOTE — Patient Instructions (Addendum)
Medication Instructions:   Your physician recommends that you continue on your current medications as directed. Please refer to the Current Medication list given to you today.  Labwork:  Your physician recommends that you return for non-fasting lab work (BMET & Mg) in next week on January 22, 2020 at [facility] from 8:00 am to 4:00 pm. No appointment is needed.   Testing/Procedures:  NONE  Follow-Up:  Your physician recommends that you schedule a follow-up appointment in: 6 months (office).  Any Other Special Instructions Will Be Listed Below (If Applicable).  If you need a refill on your cardiac medications before your next appointment, please call your pharmacy.

## 2020-01-22 ENCOUNTER — Other Ambulatory Visit (HOSPITAL_COMMUNITY)
Admission: RE | Admit: 2020-01-22 | Discharge: 2020-01-22 | Disposition: A | Discharge status: Home / Self Care | Payer: Medicare HMO | Source: Ambulatory Visit | Attending: Family Medicine | Admitting: Family Medicine

## 2020-01-22 DIAGNOSIS — R5383 Other fatigue: Secondary | ICD-10-CM | POA: Insufficient documentation

## 2020-01-22 DIAGNOSIS — I5043 Acute on chronic combined systolic (congestive) and diastolic (congestive) heart failure: Secondary | ICD-10-CM | POA: Insufficient documentation

## 2020-01-22 DIAGNOSIS — I10 Essential (primary) hypertension: Secondary | ICD-10-CM | POA: Insufficient documentation

## 2020-01-22 LAB — BASIC METABOLIC PANEL
Anion gap: 8 (ref 5–15)
BUN: 18 mg/dL (ref 8–23)
CO2: 28 mmol/L (ref 22–32)
Calcium: 8.8 mg/dL — ABNORMAL LOW (ref 8.9–10.3)
Chloride: 107 mmol/L (ref 98–111)
Creatinine, Ser: 1.25 mg/dL — ABNORMAL HIGH (ref 0.61–1.24)
GFR calc Af Amer: >60 mL/min (ref >60)
GFR calc non Af Amer: 54 mL/min — ABNORMAL LOW (ref >60)
Glucose, Bld: 94 mg/dL (ref 70–99)
Potassium: 4 mmol/L (ref 3.5–5.1)
Sodium: 143 mmol/L (ref 135–145)

## 2020-01-22 LAB — MAGNESIUM: Magnesium: 2.1 mg/dL (ref 1.7–2.4)

## 2020-01-24 ENCOUNTER — Telehealth: Payer: Self-pay | Admitting: *Deleted

## 2020-01-24 NOTE — Telephone Encounter (Signed)
-----   Message from Verta Ellen., NP sent at 01/22/2020  5:04 PM EDT ----- Please call patient and tell him his electrolytes look good and renal function look pretty close to normal and to continue the Lasix as directed.  Thank you

## 2020-01-24 NOTE — Telephone Encounter (Signed)
Patient informed. Copy sent to PCP °

## 2020-01-28 ENCOUNTER — Other Ambulatory Visit: Payer: Self-pay | Admitting: Cardiology

## 2020-02-05 ENCOUNTER — Ambulatory Visit: Payer: Medicare HMO | Admitting: Family Medicine

## 2020-02-20 ENCOUNTER — Other Ambulatory Visit (HOSPITAL_COMMUNITY)
Admission: RE | Admit: 2020-02-20 | Discharge: 2020-02-20 | Disposition: A | Discharge status: Home / Self Care | Payer: Medicare HMO | Source: Ambulatory Visit | Attending: Gastroenterology | Admitting: Gastroenterology

## 2020-02-20 ENCOUNTER — Other Ambulatory Visit: Payer: Self-pay

## 2020-02-20 ENCOUNTER — Encounter: Payer: Self-pay | Admitting: Gastroenterology

## 2020-02-20 ENCOUNTER — Ambulatory Visit (INDEPENDENT_AMBULATORY_CARE_PROVIDER_SITE_OTHER): Payer: Medicare HMO | Admitting: Gastroenterology

## 2020-02-20 VITALS — BP 135/77 | HR 92 | Temp 99.2°F | Ht 66.0 in | Wt 153.4 lb

## 2020-02-20 DIAGNOSIS — R1310 Dysphagia, unspecified: Secondary | ICD-10-CM

## 2020-02-20 DIAGNOSIS — R1013 Epigastric pain: Secondary | ICD-10-CM

## 2020-02-20 DIAGNOSIS — R932 Abnormal findings on diagnostic imaging of liver and biliary tract: Secondary | ICD-10-CM

## 2020-02-20 DIAGNOSIS — D649 Anemia, unspecified: Secondary | ICD-10-CM | POA: Diagnosis not present

## 2020-02-20 DIAGNOSIS — R1319 Other dysphagia: Secondary | ICD-10-CM | POA: Insufficient documentation

## 2020-02-20 LAB — COMPREHENSIVE METABOLIC PANEL
ALT: 22 U/L (ref 0–44)
AST: 19 U/L (ref 15–41)
Albumin: 3.4 g/dL — ABNORMAL LOW (ref 3.5–5.0)
Alkaline Phosphatase: 51 U/L (ref 38–126)
Anion gap: 8 (ref 5–15)
BUN: 12 mg/dL (ref 8–23)
CO2: 28 mmol/L (ref 22–32)
Calcium: 8.7 mg/dL — ABNORMAL LOW (ref 8.9–10.3)
Chloride: 102 mmol/L (ref 98–111)
Creatinine, Ser: 1.39 mg/dL — ABNORMAL HIGH (ref 0.61–1.24)
GFR calc Af Amer: 55 mL/min — ABNORMAL LOW (ref >60)
GFR calc non Af Amer: 48 mL/min — ABNORMAL LOW (ref >60)
Glucose, Bld: 107 mg/dL — ABNORMAL HIGH (ref 70–99)
Potassium: 4.1 mmol/L (ref 3.5–5.1)
Sodium: 138 mmol/L (ref 135–145)
Total Bilirubin: 0.5 mg/dL (ref 0.3–1.2)
Total Protein: 6.5 g/dL (ref 6.5–8.1)

## 2020-02-20 LAB — LIPASE, BLOOD: Lipase: 19 U/L (ref 11–51)

## 2020-02-20 LAB — CBC WITH DIFFERENTIAL/PLATELET
Abs Immature Granulocytes: 0.42 10*3/uL — ABNORMAL HIGH (ref 0.00–0.07)
Basophils Absolute: 0 10*3/uL (ref 0.0–0.1)
Basophils Relative: 1 %
Eosinophils Absolute: 1.6 10*3/uL — ABNORMAL HIGH (ref 0.0–0.5)
Eosinophils Relative: 19 %
HCT: 36.9 % — ABNORMAL LOW (ref 39.0–52.0)
Hemoglobin: 10.8 g/dL — ABNORMAL LOW (ref 13.0–17.0)
Immature Granulocytes: 5 %
Lymphocytes Relative: 12 %
Lymphs Abs: 1 10*3/uL (ref 0.7–4.0)
MCH: 27.3 pg (ref 26.0–34.0)
MCHC: 29.3 g/dL — ABNORMAL LOW (ref 30.0–36.0)
MCV: 93.2 fL (ref 80.0–100.0)
Monocytes Absolute: 0.5 10*3/uL (ref 0.1–1.0)
Monocytes Relative: 7 %
Neutro Abs: 4.5 10*3/uL (ref 1.7–7.7)
Neutrophils Relative %: 56 %
Platelets: 237 10*3/uL (ref 150–400)
RBC: 3.96 MIL/uL — ABNORMAL LOW (ref 4.22–5.81)
RDW: 14.8 % (ref 11.5–15.5)
WBC: 8 10*3/uL (ref 4.0–10.5)
nRBC: 0 % (ref 0.0–0.2)

## 2020-02-20 LAB — PROTIME-INR
INR: 1 (ref 0.8–1.2)
Prothrombin Time: 13.1 seconds (ref 11.4–15.2)

## 2020-02-20 LAB — IRON AND TIBC
Iron: 20 ug/dL — ABNORMAL LOW (ref 45–182)
Saturation Ratios: 6 % — ABNORMAL LOW (ref 17.9–39.5)
TIBC: 325 ug/dL (ref 250–450)
UIBC: 305 ug/dL

## 2020-02-20 LAB — FERRITIN: Ferritin: 32 ng/mL (ref 24–336)

## 2020-02-20 MED ORDER — PANTOPRAZOLE SODIUM 40 MG PO TBEC
40.0000 mg | DELAYED_RELEASE_TABLET | Freq: Every day | ORAL | 3 refills | Status: DC
Start: 2020-02-20 — End: 2020-05-21

## 2020-02-20 NOTE — Progress Notes (Signed)
Primary Care Physician:  Neale Burly, MD  Primary Gastroenterologist: Elon Alas. Abbey Chatters, DO   Chief Complaint  Patient presents with  . Abdominal Pain  . Dysphagia    HPI:  Gregory Mitchell is a 79 y.o. male here for further evaluation of epigastric pain at the request of his cardiologist, Levell July. H/o acute on chronic combined systolic and diastolic heart failure, hypertension, bilateral PE (03/2019), COPD, stage IIIa chronic renal disease.  Every morning when gets up has pain in epigastric region, into chest. Going on for two months. Tries Maalox. Will use breathing machine for copd. Will ease off. Will hurt again after eats supper. No N/V. Dysphagia with solid foods. Sticks in chest. Pill dysphagia too. BM daily, stools somewhat hard. No brbpr. Has had black stool, last time two months ago. Rare ASA powder use for headache, once every couple of months.   CTA chest, abdomen pelvis March 24, 2019: Mild to moderate diffuse esophageal wall thickening.  Mildly lobulated contours of the liver.  Normal spleen.  Small bilateral lower lobe pulmonary emboli, bilateral lower lobe pneumonia, greater on the right with possible chronic component cardiomegaly. Dilated pancreatic duct measuring 5.1 mm without significant change from 2018.  Post right nephrectomy.  Cardiac catheterization January 2021: Showed normal coronary arteries.  Echocardiogram January 2021 with LVEF of 25- 30%, severely decreased function with global hypokinesis.  Grade 2 diastolic dysfunction.  COPD, uses oxygen as night sometimes.   Colonoscopy about two years ago at [facility]. We have requested copy.       Current Outpatient Medications  Medication Sig Dispense Refill  . albuterol (PROAIR HFA) 108 (90 BASE) MCG/ACT inhaler Inhale 2 puffs into the lungs every 6 (six) hours as needed for wheezing or shortness of breath.     [name] apixaban (ELIQUIS) 5 MG TABS tablet Take 5 mg by mouth 2 (two) times daily.    . carvedilol  (COREG) 3.125 MG tablet TAKE ONE (1) TABLET (3.125 MG TOTAL) BY MOUTH TWO (2) TIMES DAILY WITH A MEAL. 180 tablet 3  . furosemide (LASIX) 20 MG tablet TAKE ONE (1) TABLET (20 MG TOTAL) BY MOUTH DAILY AS NEEDED FOR FLUID OR EDEMA (TAKE IN CASE OF LEG SWELLING OR WEIGHT GAIN THREE (3) LBS IN 24 HOURS OR FIVE (5) LBS IN 7 DAYS.). 30 tablet 2  . hydrALAZINE (APRESOLINE) 10 MG tablet TAKE ONE (1) TABLET (10 MG TOTAL) BY MOUTH EVERY 8 (EIGHT) HOURS. 270 tablet 3  . ipratropium-albuterol (DUONEB) 0.5-2.5 (3) MG/3ML SOLN Take 3 mLs by nebulization every 6 (six) hours as needed (for shortness of breath/wheezing).     [name] losartan (COZAAR) 25 MG tablet Take 1 tablet (25 mg total) by mouth daily. 90 tablet 1  . metFORMIN (GLUCOPHAGE-XR) 500 MG 24 hr tablet Take 1,000 mg by mouth 2 (two) times daily.    . Omega-3 Fatty Acids (FISH OIL) 1000 MG CAPS Take 1 capsule by mouth 2 (two) times daily.     . OXYGEN Inhale 2 L into the lungs at bedtime as needed (for shortness of breath).    . tamsulosin (FLOMAX) 0.4 MG CAPS capsule Take 0.4 mg by mouth daily.   0   No current facility-administered medications for this visit.    Allergies as of 02/20/2020 - Review Complete 02/20/2020  Allergen Reaction Noted  . Amoxicillin  11/26/2011  . Ciprofloxacin  11/26/2011    Past Medical History:  Diagnosis Date  . Asthma   . Chronic renal insufficiency   .  COPD (chronic obstructive pulmonary disease) ([place])   . Heart failure (H[facility])    Acute on chronic combined systolic and diastolic heart failure  . Hypertension   . Pulmonary emboli ([place]) 03/2019  . Renal insufficiency   . Type 2 diabetes mellitus ([place])     Past Surgical History:  Procedure Laterality Date  . APPENDECTOMY    . CHOLECYSTECTOMY    . NASAL SINUS SURGERY    . NEPHRECTOMY     RIGHT (Question CA.  No further therapy needed.)  . RIGHT/LEFT HEART CATH AND CORONARY ANGIOGRAPHY N/A 09/03/2019   Procedure: RIGHT/LEFT HEART CATH AND CORONARY ANGIOGRAPHY;   Surgeon: Lorretta Harp, MD;  Location: [facility];  Service: Cardiovascular;  Laterality: N/A;    Family History  Problem Relation Age of Onset  . Lung cancer Brother   . Stroke Mother   . Breast cancer Mother   . Colon cancer Neg Hx     Social History   Socioeconomic History  . Marital status: Married    Spouse name: Not on file  . Number of children: Not on file  . Years of education: Not on file  . Highest education level: Not on file  Occupational History  . Not on file  Tobacco Use  . Smoking status: Former Smoker    Packs/day: 1.00    Years: 15.00    Pack years: 15.00    Types: Cigarettes    Start date: 07/14/1957    Quit date: 08/09/1976    Years since quitting: 43.5  . Smokeless tobacco: Current User    Types: Chew    Last attempt to quit: 02/10/2006  . Tobacco comment: chewed tobacco for 10 years  Substance and Sexual Activity  . Alcohol use: Not Currently    Alcohol/week: 0.0 standard drinks    Comment: no etoh since he was in his 22s  . Drug use: Never  . Sexual activity: Not on file  Other Topics Concern  . Not on file  Social History Narrative   Lives with wife.  Three children.     Social Determinants of Health   Financial Resource Strain:   . Difficulty of Paying Living Expenses:   Food Insecurity:   . Worried About Charity fundraiser in the Last Year:   . Arboriculturist in the Last Year:   Transportation Needs:   . Film/video editor (Medical):   [name] Lack of Transportation (Non-Medical):   Physical Activity:   . Days of Exercise per Week:   . Minutes of Exercise per Session:   Stress:   . Feeling of Stress :   Social Connections:   . Frequency of Communication with Friends and Family:   . Frequency of Social Gatherings with Friends and Family:   . Attends Religious Services:   . Active Member of Clubs or Organizations:   . Attends Archivist Meetings:   [name] Marital Status:   Intimate Partner Violence:   . Fear of  Current or Ex-Partner:   . Emotionally Abused:   [name] Physically Abused:   . Sexually Abused:       ROS:  General: Negative for anorexia,   fever, chills, fatigue, weakness.see hpi Eyes: Negative for vision changes.  ENT: Negative for hoarseness, difficulty swallowing , nasal congestion. CV: Negative for angina, palpitations, +dyspnea on exertion, peripheral edema. See hpi Respiratory: Negative for dyspnea at rest, +dyspnea on exertion, cough, sputum, wheezing.  GI: See history of present illness.  GU:  Negative for dysuria, hematuria, urinary incontinence, urinary frequency, nocturnal urination.  MS: Negative for joint pain, low back pain.  Derm: Negative for rash or itching.  Neuro: Negative for weakness, abnormal sensation, seizure, frequent headaches, memory loss, confusion.  Psych: Negative for anxiety, depression, suicidal ideation, hallucinations.  Endo: Negative for unusual weight change.  Heme: Negative for bruising or bleeding. Allergy: Negative for rash or hives.    Physical Examination:  BP 135/77   Pulse 92   Temp 99.2 F (37.3 C) (Oral)   Ht 5\' 6"  (1.676 m)   Wt 153 lb 6.4 oz (69.6 kg)   BMI 24.76 kg/m    General: Well-nourished, well-developed in no acute distress.  Head: Normocephalic, atraumatic.   Eyes: Conjunctiva pink, no icterus. Mouth: masked Neck: Supple without thyromegaly, masses, or lymphadenopathy.  Lungs: scattered rhonchi bilaterally  Heart: Regular rate and rhythm, no murmurs rubs or gallops.  Abdomen: Bowel sounds are normal,  nondistended, no hepatosplenomegaly or masses, no abdominal bruits or    hernia , no rebound or guarding.  Moderate epig tenderness Rectal: not performed Extremities: No lower extremity edema. No clubbing or deformities.  Neuro: Alert and oriented x 4 , grossly normal neurologically.  Skin: Warm and dry, no rash or jaundice.   Psych: Alert and cooperative, normal mood and affect.  Labs: Lab Results  Component Value  Date   CREATININE 1.25 (H) 01/22/2020   BUN 18 01/22/2020   NA 143 01/22/2020   K 4.0 01/22/2020   CL 107 01/22/2020   CO2 28 01/22/2020   Lab Results  Component Value Date   WBC 5.7 01/03/2020   HGB 10.4 (L) 01/03/2020   HCT 36.6 (L) 01/03/2020   MCV 92.7 01/03/2020   PLT 252 01/03/2020   Lab Results  Component Value Date   ALT 11 09/03/2019   AST 18 09/03/2019   ALKPHOS 42 09/03/2019   BILITOT 0.5 09/03/2019   No results found for: LIPASE No results found for: IRON, TIBC, FERRITIN  No results found for: INR, PROTIME  Imaging Studies: No results found.  Impression/Plan:  Very pleasant 79 year old gentleman with multiple comorbidities including diabetes, renal insufficiency, mild pulmonary emboli, combined diastolic and systolic heart failure, COPD presenting for further evaluation of epigastric/chest pain, dysphagia at the request of his cardiologist.  Symptoms occurring chronically, has self medicated with Maalox without relief.  Interestingly a CT last year showed mild to moderate esophageal wall thickening.  Also nodular liver border concerning for cirrhosis but no evidence of portal hypertension on CT imaging.  No thrombocytopenia.  Differential diagnosis includes reflux esophagitis, stricture, malignancy.  We will plan for upper endoscopy with esophageal dilation in the near future with Dr. Abbey Chatters.  He is ASA III.  We will hold his Eliquis for 48 hours before procedure.  Start pantoprazole 40 mg daily.  I have discussed the risks, alternatives, benefits with regards to but not limited to the risk of reaction to medication, bleeding, infection, perforation and the patient is agreeable to proceed. Written consent to be obtained.  Patient also noted to have mild normocytic anemia.  Evaluated time of upper endoscopy.  We will update labs, screen for iron deficiency.  Further recommendations to follow.  Chronically dilated pancreatic duct stable since 2018. Unclear  significance.

## 2020-02-20 NOTE — Patient Instructions (Signed)
1. Start pantoprazole 40mg  daily before breakfast. RX sent to [facility]. 2. Please have labs done.  3. Upper endoscopy as scheduled. See separate instructions.

## 2020-02-21 ENCOUNTER — Encounter: Payer: Self-pay | Admitting: Gastroenterology

## 2020-02-29 ENCOUNTER — Other Ambulatory Visit: Payer: Self-pay | Admitting: Emergency Medicine

## 2020-03-03 ENCOUNTER — Other Ambulatory Visit: Payer: Self-pay | Admitting: Emergency Medicine

## 2020-03-17 ENCOUNTER — Encounter (HOSPITAL_COMMUNITY): Payer: Self-pay

## 2020-03-17 ENCOUNTER — Other Ambulatory Visit (HOSPITAL_COMMUNITY)
Admission: RE | Admit: 2020-03-17 | Discharge: 2020-03-17 | Disposition: A | Discharge status: Home / Self Care | Payer: Medicare HMO | Source: Ambulatory Visit | Attending: Internal Medicine | Admitting: Internal Medicine

## 2020-03-17 ENCOUNTER — Other Ambulatory Visit: Payer: Self-pay

## 2020-03-17 ENCOUNTER — Encounter (HOSPITAL_COMMUNITY)
Admission: RE | Admit: 2020-03-17 | Discharge: 2020-03-17 | Disposition: A | Discharge status: Home / Self Care | Payer: Medicare HMO | Source: Ambulatory Visit | Attending: Internal Medicine | Admitting: Internal Medicine

## 2020-03-17 DIAGNOSIS — E1122 Type 2 diabetes mellitus with diabetic chronic kidney disease: Secondary | ICD-10-CM | POA: Diagnosis not present

## 2020-03-17 DIAGNOSIS — Z7984 Long term (current) use of oral hypoglycemic drugs: Secondary | ICD-10-CM | POA: Diagnosis not present

## 2020-03-17 DIAGNOSIS — Z20822 Contact with and (suspected) exposure to covid-19: Secondary | ICD-10-CM | POA: Diagnosis not present

## 2020-03-17 DIAGNOSIS — R1310 Dysphagia, unspecified: Secondary | ICD-10-CM | POA: Diagnosis not present

## 2020-03-17 DIAGNOSIS — Z01812 Encounter for preprocedural laboratory examination: Secondary | ICD-10-CM | POA: Insufficient documentation

## 2020-03-17 DIAGNOSIS — I130 Hypertensive heart and chronic kidney disease with heart failure and stage 1 through stage 4 chronic kidney disease, or unspecified chronic kidney disease: Secondary | ICD-10-CM | POA: Diagnosis not present

## 2020-03-17 DIAGNOSIS — I5042 Chronic combined systolic (congestive) and diastolic (congestive) heart failure: Secondary | ICD-10-CM | POA: Diagnosis not present

## 2020-03-17 DIAGNOSIS — Z79899 Other long term (current) drug therapy: Secondary | ICD-10-CM | POA: Diagnosis not present

## 2020-03-17 DIAGNOSIS — N189 Chronic kidney disease, unspecified: Secondary | ICD-10-CM | POA: Diagnosis not present

## 2020-03-17 DIAGNOSIS — B3781 Candidal esophagitis: Secondary | ICD-10-CM | POA: Diagnosis not present

## 2020-03-17 DIAGNOSIS — Z9049 Acquired absence of other specified parts of digestive tract: Secondary | ICD-10-CM | POA: Diagnosis not present

## 2020-03-17 DIAGNOSIS — Z7901 Long term (current) use of anticoagulants: Secondary | ICD-10-CM | POA: Diagnosis not present

## 2020-03-17 DIAGNOSIS — Z905 Acquired absence of kidney: Secondary | ICD-10-CM | POA: Diagnosis not present

## 2020-03-17 DIAGNOSIS — K2970 Gastritis, unspecified, without bleeding: Secondary | ICD-10-CM | POA: Diagnosis not present

## 2020-03-17 DIAGNOSIS — Z7951 Long term (current) use of inhaled steroids: Secondary | ICD-10-CM | POA: Diagnosis not present

## 2020-03-17 DIAGNOSIS — R1013 Epigastric pain: Secondary | ICD-10-CM | POA: Diagnosis present

## 2020-03-17 DIAGNOSIS — Z87891 Personal history of nicotine dependence: Secondary | ICD-10-CM | POA: Diagnosis not present

## 2020-03-17 DIAGNOSIS — Z86711 Personal history of pulmonary embolism: Secondary | ICD-10-CM | POA: Diagnosis not present

## 2020-03-17 DIAGNOSIS — J449 Chronic obstructive pulmonary disease, unspecified: Secondary | ICD-10-CM | POA: Diagnosis not present

## 2020-03-17 LAB — SARS CORONAVIRUS 2 (TAT 6-24 HRS): SARS Coronavirus 2: NEGATIVE

## 2020-03-17 NOTE — Pre-Procedure Instructions (Signed)
In for PAT accompained by wife. Patient has Dx of COPD and CP, who was sent by cardiology due to non cardiac related chest pain. Patient sts, "I am having trouble breathing today and my chest hurts". Wife states the chest pain id the reason for EGD. Both patient and wife differ on patients history and are both poor historians. Most of history is obtained via documentation from multiple doctors. O2 sat is 95 on RA, RR is 30 and patient has a few expiratory wheezes(which according to chart is an on going thing). Patient states he has had none of his medications this morning. Dr Adalberto Ill in to talk with patient and wife. Both Dr Adalberto Ill and I have spoken at length about importance of taking meds on time, making sure he stays in contact with his Doctor about worsening COPD and medications and things that cause it to be worse, knowledge that COPD is a long term disease that will come and go in severity. As of now, procedure will go forward. They were instructed to  take medications in the morning as well as a nebulizer before he comes. They were both reassured that if he is worse tomorrow or if he is unsure he wantts to proceed tomorrow, we will reexamine him on arrival and will cancel if necessary. Both verbalized understanding.

## 2020-03-17 NOTE — Patient Instructions (Signed)
Gregory Mitchell  03/17/2020     @PREFPERIOPPHARMACY @   Your procedure is scheduled on  03/18/2020  Report to [facility] at  1:00  P.M.  Call this number if you have problems the morning of surgery:  [phone]   Remember:  Follow the diet instructions given to you by the office.                         Take these medicines the morning of surgery with A SIP OF WATER  Carvedilol, apresoline, protinix, flomax.    Do not wear jewelry, make-up or nail polish.  Do not wear lotions, powders, or perfumes. Please wear deodorant and brush your teeth.  Do not shave 48 hours prior to surgery.  Men may shave face and neck.  Do not bring valuables to the hospital.  [facility] is not responsible for any belongings or valuables.  Contacts, dentures or bridgework may not be worn into surgery.  Leave your suitcase in the car.  After surgery it may be brought to your room.  For patients admitted to the hospital, discharge time will be determined by your treatment team.  Patients discharged the day of surgery will not be allowed to drive home.   Name and phone number of your driver:   family Special instructions:  DO NOT smoke the morning of your procedure.  Please read over the following fact sheets that you were given. Anesthesia Post-op Instructions and Care and Recovery After Surgery       Upper Endoscopy, Adult, Care After This sheet gives you information about how to care for yourself after your procedure. Your health care provider may also give you more specific instructions. If you have problems or questions, contact your health care provider. What can I expect after the procedure? After the procedure, it is common to have:  A sore throat.  Mild stomach pain or discomfort.  Bloating.  Nausea. Follow these instructions at home:   Follow instructions from your health care provider about what to eat or drink after your procedure.  Return to your normal  activities as told by your health care provider. Ask your health care provider what activities are safe for you.  Take over-the-counter and prescription medicines only as told by your health care provider.  Do not drive for 24 hours if you were given a sedative during your procedure.  Keep all follow-up visits as told by your health care provider. This is important. Contact a health care provider if you have:  A sore throat that lasts longer than one day.  Trouble swallowing. Get help right away if:  You vomit blood or your vomit looks like coffee grounds.  You have: ? A fever. ? Bloody, black, or tarry stools. ? A severe sore throat or you cannot swallow. ? Difficulty breathing. ? Severe pain in your chest or abdomen. Summary  After the procedure, it is common to have a sore throat, mild stomach discomfort, bloating, and nausea.  Do not drive for 24 hours if you were given a sedative during the procedure.  Follow instructions from your health care provider about what to eat or drink after your procedure.  Return to your normal activities as told by your health care provider. This information is not intended to replace advice given to you by your health care provider. Make sure you discuss any questions you have with your health care  provider. Document Revised: 01/17/2018 Document Reviewed: 12/26/2017 Elsevier Patient Education  [place].  Esophageal Dilatation Esophageal dilatation, also called esophageal dilation, is a procedure to widen or open (dilate) a blocked or narrowed part of the esophagus. The esophagus is the part of the body that moves food and liquid from the mouth to the stomach. You may need this procedure if:  You have a buildup of scar tissue in your esophagus that makes it difficult, painful, or impossible to swallow. This can be caused by gastroesophageal reflux disease (GERD).  You have cancer of the esophagus.  There is a problem with how food  moves through your esophagus. In some cases, you may need this procedure repeated at a later time to dilate the esophagus gradually. Tell a health care provider about:  Any allergies you have.  All medicines you are taking, including vitamins, herbs, eye drops, creams, and over-the-counter medicines.  Any problems you or family members have had with anesthetic medicines.  Any blood disorders you have.  Any surgeries you have had.  Any medical conditions you have.  Any antibiotic medicines you are required to take before dental procedures.  Whether you are pregnant or may be pregnant. What are the risks? Generally, this is a safe procedure. However, problems may occur, including:  Bleeding due to a tear in the lining of the esophagus.  A hole (perforation) in the esophagus. What happens before the procedure?  Follow instructions from your health care provider about eating or drinking restrictions.  Ask your health care provider about changing or stopping your regular medicines. This is especially important if you are taking diabetes medicines or blood thinners.  Plan to have someone take you home from the hospital or clinic.  Plan to have a responsible adult care for you for at least 24 hours after you leave the hospital or clinic. This is important. What happens during the procedure?  You may be given a medicine to help you relax (sedative).  A numbing medicine may be sprayed into the back of your throat, or you may gargle the medicine.  Your health care provider may perform the dilatation using various surgical instruments, such as: ? Simple dilators. This instrument is carefully placed in the esophagus to stretch it. ? Guided wire bougies. This involves using an endoscope to insert a wire into the esophagus. A dilator is passed over this wire to enlarge the esophagus. Then the wire is removed. ? Balloon dilators. An endoscope with a small balloon at the end is inserted  into the esophagus. The balloon is inflated to stretch the esophagus and open it up. The procedure may vary among health care providers and hospitals. What happens after the procedure?  Your blood pressure, heart rate, breathing rate, and blood oxygen level will be monitored until the medicines you were given have worn off.  Your throat may feel slightly sore and numb. This will improve slowly over time.  You will not be allowed to eat or drink until your throat is no longer numb.  When you are able to drink, urinate, and sit on the edge of the bed without nausea or dizziness, you may be able to return home. Follow these instructions at home:  Take over-the-counter and prescription medicines only as told by your health care provider.  Do not drive for 24 hours if you were given a sedative during your procedure.  You should have a responsible adult with you for 24 hours after the procedure.  Follow instructions from your health care provider about any eating or drinking restrictions.  Do not use any products that contain nicotine or tobacco, such as cigarettes and e-cigarettes. If you need help quitting, ask your health care provider.  Keep all follow-up visits as told by your health care provider. This is important. Get help right away if you:  Have a fever.  Have chest pain.  Have pain that is not relieved by medication.  Have trouble breathing.  Have trouble swallowing.  Vomit blood. Summary  Esophageal dilatation, also called esophageal dilation, is a procedure to widen or open (dilate) a blocked or narrowed part of the esophagus.  Plan to have someone take you home from the hospital or clinic.  For this procedure, a numbing medicine may be sprayed into the back of your throat, or you may gargle the medicine.  Do not drive for 24 hours if you were given a sedative during your procedure. This information is not intended to replace advice given to you by your health  care provider. Make sure you discuss any questions you have with your health care provider. Document Revised: 05/23/2019 Document Reviewed: 05/31/2017 Elsevier Patient Education  2020 [place] After These instructions provide you with information about caring for yourself after your procedure. Your health care provider may also give you more specific instructions. Your treatment has been planned according to current medical practices, but problems sometimes occur. Call your health care provider if you have any problems or questions after your procedure. What can I expect after the procedure? After your procedure, you may:  Feel sleepy for several hours.  Feel clumsy and have poor balance for several hours.  Feel forgetful about what happened after the procedure.  Have poor judgment for several hours.  Feel nauseous or vomit.  Have a sore throat if you had a breathing tube during the procedure. Follow these instructions at home: For at least 24 hours after the procedure:      Have a responsible adult stay with you. It is important to have someone help care for you until you are awake and alert.  Rest as needed.  Do not: ? Participate in activities in which you could fall or become injured. ? Drive. ? Use heavy machinery. ? Drink alcohol. ? Take sleeping pills or medicines that cause drowsiness. ? Make important decisions or sign legal documents. ? Take care of children on your own. Eating and drinking  Follow the diet that is recommended by your health care provider.  If you vomit, drink water, juice, or soup when you can drink without vomiting.  Make sure you have little or no nausea before eating solid foods. General instructions  Take over-the-counter and prescription medicines only as told by your health care provider.  If you have sleep apnea, surgery and certain medicines can increase your risk for breathing problems. Follow  instructions from your health care provider about wearing your sleep device: ? Anytime you are sleeping, including during daytime naps. ? While taking prescription pain medicines, sleeping medicines, or medicines that make you drowsy.  If you smoke, do not smoke without supervision.  Keep all follow-up visits as told by your health care provider. This is important. Contact a health care provider if:  You keep feeling nauseous or you keep vomiting.  You feel light-headed.  You develop a rash.  You have a fever. Get help right away if:  You have trouble breathing. Summary  For several  hours after your procedure, you may feel sleepy and have poor judgment.  Have a responsible adult stay with you for at least 24 hours or until you are awake and alert. This information is not intended to replace advice given to you by your health care provider. Make sure you discuss any questions you have with your health care provider. Document Revised: 10/24/2017 Document Reviewed: 11/16/2015 Elsevier Patient Education  [place].

## 2020-03-18 ENCOUNTER — Ambulatory Visit (HOSPITAL_COMMUNITY)
Admission: RE | Admit: 2020-03-18 | Discharge: 2020-03-18 | Disposition: A | Discharge status: Home / Self Care | Payer: Medicare HMO | Attending: Internal Medicine | Admitting: Internal Medicine

## 2020-03-18 ENCOUNTER — Encounter (HOSPITAL_COMMUNITY)
Admission: RE | Disposition: A | Discharge status: Home / Self Care | Payer: Self-pay | Source: Home / Self Care | Attending: Internal Medicine

## 2020-03-18 ENCOUNTER — Ambulatory Visit (HOSPITAL_COMMUNITY): Payer: Medicare HMO | Admitting: Anesthesiology

## 2020-03-18 ENCOUNTER — Encounter (HOSPITAL_COMMUNITY): Payer: Self-pay | Admitting: *Deleted

## 2020-03-18 ENCOUNTER — Other Ambulatory Visit: Payer: Self-pay

## 2020-03-18 DIAGNOSIS — Z87891 Personal history of nicotine dependence: Secondary | ICD-10-CM | POA: Insufficient documentation

## 2020-03-18 DIAGNOSIS — B3781 Candidal esophagitis: Secondary | ICD-10-CM | POA: Diagnosis not present

## 2020-03-18 DIAGNOSIS — Z9049 Acquired absence of other specified parts of digestive tract: Secondary | ICD-10-CM | POA: Diagnosis not present

## 2020-03-18 DIAGNOSIS — Z7984 Long term (current) use of oral hypoglycemic drugs: Secondary | ICD-10-CM | POA: Insufficient documentation

## 2020-03-18 DIAGNOSIS — I130 Hypertensive heart and chronic kidney disease with heart failure and stage 1 through stage 4 chronic kidney disease, or unspecified chronic kidney disease: Secondary | ICD-10-CM | POA: Diagnosis not present

## 2020-03-18 DIAGNOSIS — R1013 Epigastric pain: Secondary | ICD-10-CM | POA: Diagnosis not present

## 2020-03-18 DIAGNOSIS — Z79899 Other long term (current) drug therapy: Secondary | ICD-10-CM | POA: Diagnosis not present

## 2020-03-18 DIAGNOSIS — N189 Chronic kidney disease, unspecified: Secondary | ICD-10-CM | POA: Diagnosis not present

## 2020-03-18 DIAGNOSIS — Z86711 Personal history of pulmonary embolism: Secondary | ICD-10-CM | POA: Insufficient documentation

## 2020-03-18 DIAGNOSIS — Z7901 Long term (current) use of anticoagulants: Secondary | ICD-10-CM | POA: Insufficient documentation

## 2020-03-18 DIAGNOSIS — Z7951 Long term (current) use of inhaled steroids: Secondary | ICD-10-CM | POA: Insufficient documentation

## 2020-03-18 DIAGNOSIS — Z905 Acquired absence of kidney: Secondary | ICD-10-CM | POA: Diagnosis not present

## 2020-03-18 DIAGNOSIS — E1122 Type 2 diabetes mellitus with diabetic chronic kidney disease: Secondary | ICD-10-CM | POA: Diagnosis not present

## 2020-03-18 DIAGNOSIS — I5042 Chronic combined systolic (congestive) and diastolic (congestive) heart failure: Secondary | ICD-10-CM | POA: Insufficient documentation

## 2020-03-18 DIAGNOSIS — J449 Chronic obstructive pulmonary disease, unspecified: Secondary | ICD-10-CM | POA: Diagnosis not present

## 2020-03-18 DIAGNOSIS — R1310 Dysphagia, unspecified: Secondary | ICD-10-CM | POA: Insufficient documentation

## 2020-03-18 DIAGNOSIS — K2970 Gastritis, unspecified, without bleeding: Secondary | ICD-10-CM

## 2020-03-18 HISTORY — PX: ESOPHAGOGASTRODUODENOSCOPY (EGD) WITH PROPOFOL: SHX5813

## 2020-03-18 HISTORY — PX: BALLOON DILATION: SHX5330

## 2020-03-18 LAB — GLUCOSE, CAPILLARY
Glucose-Capillary: 95 mg/dL (ref 70–99)
Glucose-Capillary: 94 mg/dL (ref 70–99)

## 2020-03-18 SURGERY — ESOPHAGOGASTRODUODENOSCOPY (EGD) WITH PROPOFOL
Anesthesia: General

## 2020-03-18 MED ORDER — CHLORHEXIDINE GLUCONATE CLOTH 2 % EX PADS: 6.0000 | MEDICATED_PAD | Freq: Once | CUTANEOUS | Status: DC

## 2020-03-18 MED ORDER — PROPOFOL 10 MG/ML IV BOLUS
INTRAVENOUS | Status: DC | PRN
  Administered 2020-03-18: 30 mg via INTRAVENOUS
  Administered 2020-03-18: 20 mg via INTRAVENOUS

## 2020-03-18 MED ORDER — LIDOCAINE VISCOUS HCL 2 % MT SOLN
15.0000 mL | Freq: Once | OROMUCOSAL | Status: AC
  Administered 2020-03-18: 15 mL via OROMUCOSAL

## 2020-03-18 MED ORDER — LIDOCAINE VISCOUS HCL 2 % MT SOLN
OROMUCOSAL | Status: AC
  Filled 2020-03-18: qty 15

## 2020-03-18 MED ORDER — LACTATED RINGERS IV SOLN: INTRAVENOUS | Status: DC

## 2020-03-18 MED ORDER — FLUCONAZOLE 200 MG PO TABS
200.0000 mg | ORAL_TABLET | Freq: Every day | ORAL | 0 refills | Status: AC
Start: 2020-03-18 — End: 2020-04-01

## 2020-03-18 MED ORDER — STERILE WATER FOR IRRIGATION IR SOLN
Status: DC | PRN
  Administered 2020-03-18: 1.5 mL

## 2020-03-18 NOTE — H&P (Signed)
Referring Provider: No ref. provider found Primary Care Physician:  Neale Burly, MD Primary Gastroenterologist:  Dr.  Harrison Mons:  Gregory Mitchell is a 79 y.o. male who presents for outpatient EGD due to history of epigastric pain and dysphagia    Past Medical History:  Diagnosis Date  . Asthma   . Chronic renal insufficiency   . COPD (chronic obstructive pulmonary disease) ([place])   . Heart failure (HCC)    Acute on chronic combined systolic and diastolic heart failure  . Hypertension   . Pulmonary emboli ([place]) 03/2019  . Renal insufficiency   . Type 2 diabetes mellitus ([place])     Past Surgical History:  Procedure Laterality Date  . APPENDECTOMY    . CHOLECYSTECTOMY    . NASAL SINUS SURGERY    . NEPHRECTOMY     RIGHT (Question CA.  No further therapy needed.)  . RIGHT/LEFT HEART CATH AND CORONARY ANGIOGRAPHY N/A 09/03/2019   Procedure: RIGHT/LEFT HEART CATH AND CORONARY ANGIOGRAPHY;  Surgeon: Lorretta Harp, MD;  Location: [facility];  Service: Cardiovascular;  Laterality: N/A;    Prior to Admission medications   Medication Sig Start Date End Date Taking? Authorizing Provider  albuterol (PROAIR HFA) 108 (90 BASE) MCG/ACT inhaler Inhale 2 puffs into the lungs every 6 (six) hours as needed for wheezing or shortness of breath.    Yes [provider]  apixaban (ELIQUIS) 5 MG TABS tablet Take 5 mg by mouth 2 (two) times daily.   Yes [provider]  carvedilol (COREG) 3.125 MG tablet TAKE ONE (1) TABLET (3.125 MG TOTAL) BY MOUTH TWO (2) TIMES DAILY WITH A MEAL. Patient taking differently: Take 3.125 mg by mouth 2 (two) times daily with a meal.  01/03/20  Yes Verta Ellen., NP  cholecalciferol (VITAMIN D3) 25 MCG (1000 UNIT) tablet Take 1,000 Units by mouth daily.   Yes [provider]  ciprofloxacin (CIPRO) 500 MG tablet Take 500 mg by mouth 2 (two) times daily. 02/27/20  Yes [provider]  furosemide (LASIX) 20 MG tablet TAKE ONE (1)  TABLET (20 MG TOTAL) BY MOUTH DAILY AS NEEDED FOR FLUID OR EDEMA (TAKE IN CASE OF LEG SWELLING OR WEIGHT GAIN THREE (3) LBS IN 24 HOURS OR FIVE (5) LBS IN 7 DAYS.). Patient taking differently: Take 20 mg by mouth daily as needed for fluid or edema.  01/28/20  Yes Branch, Alphonse Guild, MD  hydrALAZINE (APRESOLINE) 10 MG tablet TAKE ONE (1) TABLET (10 MG TOTAL) BY MOUTH EVERY 8 (EIGHT) HOURS. 01/17/20  Yes Verta Ellen., NP  HYDROcodone-acetaminophen [facility]) 7.5-325 MG tablet Take 1 tablet by mouth 2 (two) times daily as needed for pain. 02/13/20  Yes [provider]  ipratropium-albuterol (DUONEB) 0.5-2.5 (3) MG/3ML SOLN Take 3 mLs by nebulization every 6 (six) hours as needed (for shortness of breath/wheezing).    Yes [provider]  losartan (COZAAR) 50 MG tablet Take 50 mg by mouth daily. 01/04/20  Yes [provider]  metFORMIN (GLUCOPHAGE-XR) 500 MG 24 hr tablet Take 1,000 mg by mouth 2 (two) times daily.   Yes [provider]  Omega-3 Fatty Acids (FISH OIL) 1000 MG CAPS Take 1,000 mg by mouth daily.    Yes [provider]  OXYGEN Inhale 2 L into the lungs at bedtime as needed (for shortness of breath).   Yes [provider]  pantoprazole (PROTONIX) 40 MG tablet Take 1 tablet (40 mg total) by mouth daily  before breakfast. 02/20/20  Yes Mahala Menghini, PA-C  SYMBICORT 160-4.5 MCG/ACT inhaler Inhale 2 puffs into the lungs in the morning and at bedtime. 02/03/20  Yes [provider]  tamsulosin (FLOMAX) 0.4 MG CAPS capsule Take 0.4 mg by mouth daily.  11/02/17  Yes [provider]  losartan (COZAAR) 25 MG tablet Take 1 tablet (25 mg total) by mouth daily. Patient not taking: Reported on 03/04/2020 11/27/19 03/04/20  Verta Ellen., NP    Current Facility-Administered Medications  Medication Dose Route Frequency Provider Last Rate Last Admin  . Chlorhexidine Gluconate Cloth 2 % PADS 6 each  6 each Topical Once Eloise Harman, DO       And  . Chlorhexidine Gluconate Cloth 2 % PADS 6 each  6 each Topical Once Hurshel Keys K, DO      . lidocaine (XYLOCAINE) 2 % viscous mouth solution 15 mL  15 mL Mouth/Throat Once Louann Sjogren, MD      . simethicone susp in sterile water 1000 mL irrigation    PRN Hurshel Keys K, DO   1.5 mL at 03/18/20 1341    Allergies as of 02/20/2020 - Review Complete 02/20/2020  Allergen Reaction Noted  . Amoxicillin  11/26/2011  . Ciprofloxacin  11/26/2011    Family History  Problem Relation Age of Onset  . Lung cancer Brother   . Stroke Mother   . Breast cancer Mother   . Colon cancer Neg Hx     Social History   Socioeconomic History  . Marital status: Married    Spouse name: Not on file  . Number of children: Not on file  . Years of education: Not on file  . Highest education level: Not on file  Occupational History  . Not on file  Tobacco Use  . Smoking status: Former Smoker    Packs/day: 1.00    Years: 15.00    Pack years: 15.00    Types: Cigarettes    Start date: 07/14/1957    Quit date: 08/09/1976    Years since quitting: 43.6  . Smokeless tobacco: Former Systems developer    Types: Chew    Quit date: 02/10/2006  . Tobacco comment: chewed tobacco for 10 years  Vaping Use  . Vaping Use: Never used  Substance and Sexual Activity  . Alcohol use: Not Currently    Alcohol/week: 0.0 standard drinks    Comment: no etoh since he was in his 39s  . Drug use: Never  . Sexual activity: Yes  Other Topics Concern  . Not on file  Social History Narrative   Lives with wife.  Three children.     Social Determinants of Health   Financial Resource Strain:   . Difficulty of Paying Living Expenses:   Food Insecurity:   . Worried About Charity fundraiser in the Last Year:   . Arboriculturist in the Last Year:   Transportation Needs:   . Film/video editor (Medical):   [name] Lack of Transportation (Non-Medical):   Physical Activity:   . Days of Exercise per Week:   .  Minutes of Exercise per Session:   Stress:   . Feeling of Stress :   Social Connections:   . Frequency of Communication with Friends and Family:   . Frequency of Social Gatherings with Friends and Family:   . Attends Religious Services:   . Active Member of Clubs or Organizations:   . Attends Archivist  Meetings:   [name] Marital Status:   Intimate Partner Violence:   . Fear of Current or Ex-Partner:   . Emotionally Abused:   [name] Physically Abused:   . Sexually Abused:     Review of Systems: General: Negative for anorexia, weight loss, fever, chills, fatigue, weakness. Eyes: Negative for vision changes.  ENT: Negative for hoarseness, difficulty swallowing , nasal congestion. CV: Negative for chest pain, angina, palpitations, dyspnea on exertion, peripheral edema.  Respiratory: Negative for dyspnea at rest, dyspnea on exertion, cough, sputum, wheezing.  GI: See history of present illness. GU:  Negative for dysuria, hematuria, urinary incontinence, urinary frequency, nocturnal urination.  MS: Negative for joint pain, low back pain.  Derm: Negative for rash or itching.  Neuro: Negative for weakness, abnormal sensation, seizure, frequent headaches, memory loss, confusion.  Psych: Negative for anxiety, depression, suicidal ideation, hallucinations.  Endo: Negative for unusual weight change.  Heme: Negative for bruising or bleeding. Allergy: Negative for rash or hives.  Physical Exam: Vital signs in last 24 hours: Temp:  [98.4 F (36.9 C)] 98.4 F (36.9 C) (08/10 1304) Pulse Rate:  [91] 91 (08/10 1304) Resp:  [22] 22 (08/10 1304) SpO2:  [98 %] 98 % (08/10 1304) Weight:  [72.1 kg] 72.1 kg (08/10 1304)   General:   Alert,  Well-developed, well-nourished, pleasant and cooperative in NAD Head:  Normocephalic and atraumatic. Eyes:  Sclera clear, no icterus.   Conjunctiva pink. Ears:  Normal auditory acuity. Nose:  No deformity, discharge,  or lesions. Mouth:  No deformity or  lesions, dentition normal. Neck:  Supple; no masses or thyromegaly. Lungs:  Clear throughout to auscultation.   No wheezes, crackles, or rhonchi. No acute distress. Heart:  Regular rate and rhythm; no murmurs, clicks, rubs,  or gallops. Abdomen:  Soft, nontender and nondistended. No masses, hepatosplenomegaly or hernias noted. Normal bowel sounds, without guarding, and without rebound.   Rectal:  Deferred until time of colonoscopy.   Msk:  Symmetrical without gross deformities. Normal posture. Pulses:  Normal pulses noted. Extremities:  Without clubbing or edema. Neurologic:  Alert and  oriented x4;  grossly normal neurologically. Skin:  Intact without significant lesions or rashes. Cervical Nodes:  No significant cervical adenopathy. Psych:  Alert and cooperative. Normal mood and affect.  Intake/Output from previous day: No intake/output data recorded. Intake/Output this shift: No intake/output data recorded.  Lab Results: No results for input(s): WBC, HGB, HCT, PLT in the last 72 hours. BMET No results for input(s): NA, K, CL, CO2, GLUCOSE, BUN, CREATININE, CALCIUM in the last 72 hours. LFT No results for input(s): PROT, ALBUMIN, AST, ALT, ALKPHOS, BILITOT, BILIDIR, IBILI in the last 72 hours. PT/INR No results for input(s): LABPROT, INR in the last 72 hours. Hepatitis Panel No results for input(s): HEPBSAG, HCVAB, HEPAIGM, HEPBIGM in the last 72 hours. C-Diff No results for input(s): CDIFFTOX in the last 72 hours.  Studies/Results: No results found.  Impression: Dysphagia  Epigastric pain  Plan: Proceed with EGD with possible dilation. Patient is higher risk for complications from anesthesia including sudden cardiac arrest due to numerous co morbidities which I discussed with him. He would like to proceed.    LOS: 0 days     03/18/2020, 1:44 PM

## 2020-03-18 NOTE — Op Note (Signed)
[facility] Patient Name: Gregory Mitchell Procedure Date: 03/18/2020 1:43 PM MRN: [id number] Date of Birth: 07-Nov-1940 Attending MD: Elon Alas. Edgar Frisk CSN: [id number] Age: 79 Admit Type: Outpatient Procedure:                Upper GI endoscopy Indications:              Epigastric abdominal pain, Dysphagia Providers:                Elon Alas. Romney Compean, DO, Otis Peak B. Sharon Seller, RN,                            Caprice Kluver, Aram Candela Referring MD:              Medicines:                See the Anesthesia note for documentation of the                            administered medications Complications:            No immediate complications. Estimated Blood Loss:     Estimated blood loss: none. Procedure:                Pre-Anesthesia Assessment:                           - The anesthesia plan was to use monitored                            anesthesia care (MAC).                           After obtaining informed consent, the endoscope was                            passed under direct vision. Throughout the                            procedure, the patient's blood pressure, pulse, and                            oxygen saturations were monitored continuously. The                            GIF-H190 ([id number]) scope was introduced through the                            mouth, and advanced to the second part of duodenum.                            The upper GI endoscopy was accomplished without                            difficulty. The patient tolerated the procedure                            well. Scope  In: 1:56:27 PM Scope Out: 1:58:55 PM Total Procedure Duration: 0 hours 2 minutes 28 seconds  Findings:      Moderately severe esophagitis with no bleeding was found in the middle       third of the esophagus.      Localized moderate inflammation characterized by erosions and erythema       was found in the gastric antrum.      The duodenal bulb, first portion of the duodenum and  second portion of       the duodenum were normal. Impression:               - Moderately severe candidiasis esophagitis with no                            bleeding.                           - Gastritis.                           - Normal duodenal bulb, first portion of the                            duodenum and second portion of the duodenum.                           - No specimens collected. Moderate Sedation:      Per Anesthesia Care Recommendation:           - Patient has a contact number available for                            emergencies. The signs and symptoms of potential                            delayed complications were discussed with the                            patient. Return to normal activities tomorrow.                            Written discharge instructions were provided to the                            patient.                           - Resume previous diet.                           - Continue present medications.                           - Diflucan (fluconazole) 200 mg PO daily for 2                            weeks.                           -  Return to GI clinic as previously scheduled. Procedure Code(s):        --- Professional ---                           [id number], Esophagogastroduodenoscopy, flexible,                            transoral; diagnostic, including collection of                            specimen(s) by brushing or washing, when performed                            (separate procedure) Diagnosis Code(s):        --- Professional ---                           B37.81, Candidal esophagitis                           K29.70, Gastritis, unspecified, without bleeding                           R10.13, Epigastric pain                           R13.10, Dysphagia, unspecified CPT copyright 2019 American Medical Association. All rights reserved. The codes documented in this report are preliminary and upon coder review may  be revised to meet  current compliance requirements. Elon Alas. Abbey Chatters, [place] Abbey Chatters, DO 03/18/2020 2:02:02 PM This report has been signed electronically. Number of Addenda: 0

## 2020-03-18 NOTE — Discharge Instructions (Addendum)
Standard post EGD instructions  Moderately severe candidal esophagitis found on EGD. - Take Diflucan 400 mg day 1 then 200 mg daily for a total of 14 days (sent to pharmacy) - Need to wash mouth out after every use with Symbicort  Follow up with Magda Paganini in GI clinic in 6 weeks     Upper Endoscopy, Adult, Care After This sheet gives you information about how to care for yourself after your procedure. Your health care provider may also give you more specific instructions. If you have problems or questions, contact your health care provider. What can I expect after the procedure? After the procedure, it is common to have:  A sore throat.  Mild stomach pain or discomfort.  Bloating.  Nausea. Follow these instructions at home:   Follow instructions from your health care provider about what to eat or drink after your procedure.  Return to your normal activities as told by your health care provider. Ask your health care provider what activities are safe for you.  Take over-the-counter and prescription medicines only as told by your health care provider.  Do not drive for 24 hours if you were given a sedative during your procedure.  Keep all follow-up visits as told by your health care provider. This is important. Contact a health care provider if you have:  A sore throat that lasts longer than one day.  Trouble swallowing. Get help right away if:  You vomit blood or your vomit looks like coffee grounds.  You have: ? A fever. ? Bloody, black, or tarry stools. ? A severe sore throat or you cannot swallow. ? Difficulty breathing. ? Severe pain in your chest or abdomen. Summary  After the procedure, it is common to have a sore throat, mild stomach discomfort, bloating, and nausea.  Do not drive for 24 hours if you were given a sedative during the procedure.  Follow instructions from your health care provider about what to eat or drink after your procedure.  Return to  your normal activities as told by your health care provider. This information is not intended to replace advice given to you by your health care provider. Make sure you discuss any questions you have with your health care provider. Document Revised: 01/17/2018 Document Reviewed: 12/26/2017 Elsevier Patient Education  2020 [place] After These instructions provide you with information about caring for yourself after your procedure. Your health care provider may also give you more specific instructions. Your treatment has been planned according to current medical practices, but problems sometimes occur. Call your health care provider if you have any problems or questions after your procedure. What can I expect after the procedure? After your procedure, you may:  Feel sleepy for several hours.  Feel clumsy and have poor balance for several hours.  Feel forgetful about what happened after the procedure.  Have poor judgment for several hours.  Feel nauseous or vomit.  Have a sore throat if you had a breathing tube during the procedure. Follow these instructions at home: For at least 24 hours after the procedure:      Have a responsible adult stay with you. It is important to have someone help care for you until you are awake and alert.  Rest as needed.  Do not: ? Participate in activities in which you could fall or become injured. ? Drive. ? Use heavy machinery. ? Drink alcohol. ? Take sleeping pills or medicines that cause drowsiness. ?  Make important decisions or sign legal documents. ? Take care of children on your own. Eating and drinking  Follow the diet that is recommended by your health care provider.  If you vomit, drink water, juice, or soup when you can drink without vomiting.  Make sure you have little or no nausea before eating solid foods. General instructions  Take over-the-counter and prescription medicines only as  told by your health care provider.  If you have sleep apnea, surgery and certain medicines can increase your risk for breathing problems. Follow instructions from your health care provider about wearing your sleep device: ? Anytime you are sleeping, including during daytime naps. ? While taking prescription pain medicines, sleeping medicines, or medicines that make you drowsy.  If you smoke, do not smoke without supervision.  Keep all follow-up visits as told by your health care provider. This is important. Contact a health care provider if:  You keep feeling nauseous or you keep vomiting.  You feel light-headed.  You develop a rash.  You have a fever. Get help right away if:  You have trouble breathing. Summary  For several hours after your procedure, you may feel sleepy and have poor judgment.  Have a responsible adult stay with you for at least 24 hours or until you are awake and alert. This information is not intended to replace advice given to you by your health care provider. Make sure you discuss any questions you have with your health care provider. Document Revised: 10/24/2017 Document Reviewed: 11/16/2015 Elsevier Patient Education  [place].

## 2020-03-18 NOTE — Anesthesia Postprocedure Evaluation (Signed)
Anesthesia Post Note  Patient: Gregory Mitchell  Procedure(s) Performed: ESOPHAGOGASTRODUODENOSCOPY (EGD) WITH PROPOFOL (N/A ) BALLOON DILATION (N/A )  Patient location during evaluation: PACU Anesthesia Type: General Level of consciousness: awake Pain management: pain level controlled Vital Signs Assessment: post-procedure vital signs reviewed and stable Respiratory status: spontaneous breathing Cardiovascular status: blood pressure returned to baseline Anesthetic complications: no   No complications documented.   Last Vitals:  Vitals:   03/18/20 1415 03/18/20 1416  BP:  (P) 119/83  Pulse: 84   Resp: (!) 27 (!) 29  Temp:    SpO2: 97%     Last Pain:  Vitals:   03/18/20 1354  TempSrc:   PainSc: [name]

## 2020-03-18 NOTE — Anesthesia Preprocedure Evaluation (Signed)
Anesthesia Evaluation  Patient identified by MRN, date of birth, ID band Patient awake    Reviewed: Allergy & Precautions, H&P , NPO status , Patient's Chart, lab work & pertinent test results, reviewed documented beta blocker date and time   Airway Mallampati: II  TM Distance: >3 FB Neck ROM: full    Dental no notable dental hx.    Pulmonary asthma , COPD,  oxygen dependent, former smoker,    Pulmonary exam normal breath sounds clear to auscultation       Cardiovascular Exercise Tolerance: Good hypertension, +CHF  + dysrhythmias  Rhythm:regular Rate:Normal     Neuro/Psych negative neurological ROS  negative psych ROS   GI/Hepatic negative GI ROS, Neg liver ROS,   Endo/Other  negative endocrine ROSdiabetes  Renal/GU CRFRenal disease  negative genitourinary   Musculoskeletal   Abdominal   Peds  Hematology negative hematology ROS (+)   Anesthesia Other Findings I've discussed this case at length with the patient and with Dr. Abbey Chatters.  This patient has chronic epigastric pain and shortness of breath.  He currently complains of those symptoms.  Appropriate cardiac workup has been completed, and his known cardiomyopathy will complicate the anesthetic.  However, he suffers continuously with this pain, and despite his poor health, he does appear to be at his baseline.  Will proceed with lighter sedation and have a low threshold for aborting the procedure if the sedation isn't tolerated.  Reproductive/Obstetrics negative OB ROS                             Anesthesia Physical Anesthesia Plan  ASA: II  Anesthesia Plan: General   Post-op Pain Management:    Induction:   PONV Risk Score and Plan:   Airway Management Planned:   Additional Equipment:   Intra-op Plan:   Post-operative Plan:   Informed Consent: I have reviewed the patients History and Physical, chart, labs and discussed the  procedure including the risks, benefits and alternatives for the proposed anesthesia with the patient or authorized representative who has indicated his/her understanding and acceptance.     Dental Advisory Given  Plan Discussed with: CRNA  Anesthesia Plan Comments:         Anesthesia Quick Evaluation

## 2020-03-18 NOTE — Transfer of Care (Signed)
Immediate Anesthesia Transfer of Care Note  Patient: Gregory Mitchell  Procedure(s) Performed: ESOPHAGOGASTRODUODENOSCOPY (EGD) WITH PROPOFOL (N/A ) BALLOON DILATION (N/A )  Patient Location: PACU  Anesthesia Type:MAC  Level of Consciousness: awake, alert  and oriented  Airway & Oxygen Therapy: Patient Spontanous Breathing and Patient connected to nasal cannula oxygen  Post-op Assessment: Report given to RN and Post -op Vital signs reviewed and stable  Post vital signs: Reviewed and stable  Last Vitals:  Vitals Value Taken Time  BP 142/78   Temp    Pulse 89   Resp 26   SpO2 97%     Last Pain:  Vitals:   03/18/20 1354  TempSrc:   PainSc: 3       Patients Stated Pain Goal: 5 (03/88/82 8003)  Complications: No complications documented.

## 2020-03-19 ENCOUNTER — Telehealth: Payer: Self-pay | Admitting: Internal Medicine

## 2020-03-19 NOTE — Telephone Encounter (Signed)
[facility]- they do have rx and it is ready for the pt to pick up. I called the pt, he said he went yesterday and they told him they didn't have it. I informed him that I just spoke with the pharmacy and his rx is ready for him to pick up. Pt stated he would go get it.

## 2020-03-19 NOTE — Telephone Encounter (Signed)
Anitra from Short Stay called to say that the patient's Diflucan prescription hasn't been sent to [facility] in [place].

## 2020-03-21 ENCOUNTER — Encounter (HOSPITAL_COMMUNITY): Payer: Self-pay | Admitting: Internal Medicine

## 2020-03-25 ENCOUNTER — Encounter: Payer: Self-pay | Admitting: *Deleted

## 2020-04-02 NOTE — Progress Notes (Signed)
Cardiology Office Note  Date: 04/03/2020   ID: Gregory Mitchell, DOB Nov 29, 1940, MRN [id number]  PCP:  Neale Burly, MD  Cardiologist:  Carlyle Dolly, MD Electrophysiologist:  None   Chief Complaint: Follow-up chronic HFrEF, HTN, HLD, bilateral PE  History of Present Illness: Gregory Mitchell is a 79 y.o. male with a history of  chronic HFrEF, HTN, HLD, bilateral PE, COPD, DM type II, chronic hypoxic respiratory failure, stage IIIa chronic kidney disease  Hospital admission on 08/31/2019 for complaints of dyspnea, orthopnea, chest pressure.  Admitted with acute on chronic systolic heart failure.  Received aggressive diuresis with IV furosemide with good response.  Cardiac catheterization on 09/03/2019 showed normal coronary arteries, echo showed reduced EF of 25 to 30% with LV global hypokinesis.  Previous echo in 2017 showed EF 55 to 60%.   Last seen by me on 01/03/2019 complaining of increasing episodes of chest pain usually in the morning it feels like a tightness.  Stated it comes and goes.  He went to an urgent care facility in [place] and was prescribed doxycycline for bronchitis-like infection.  He stated his shortness of breath was getting worse.  He denied any weight gain or lower extremity edema.  Chest x-ray was ordered along with BNP. BNP was elevated at 1200.  His Lasix was increased to 20 mg twice a day for the next week.  Chest x-ray showed evidence of acute bronchitis, COPD and possibility of pulmonary edema or atypical viral pneumonia but these were felt to be less likely.  Had chronic hyperinflation of the lungs impression was COPD/emphysema.  He called on 01/09/2020 reporting feeling tired and sluggish that day.  He stated his chest pain and shortness of breath were better.  He denied any dizziness but reported not drinking a lot of fluids.  He was advised to increase fluid intake and to take his second dose of Lasix around 1-2 PM and eat some bananas and drink orange juice  and if symptoms became worse to go to the emergency room for an evaluation.   At last visit his chest pain and shortness of breath had improved.  Stated he just felt tired all the time.  He stated his allergies were bothering his breathing.  Stated he gets up in the morning and does not initial nebulizer treatment every morning due to feeling short of breath.  Stated his primary care provider just started him on doxycycline and prednisone for respiratory infection.  Stated he was feeling a little better since he started the antibiotic.  Otherwise he stated he just felt tired all the time. He had some recent lab work which showed some anemia with hemoglobin of 10.4 and hematocrit 36.6.  He denies any bleeding issues.  Stated he did have occasional black stools.  EGD on 03/18/2020 Dr Abbey Chatters : Moderately severe esophagitis with no bleeding was found in the middle third of the esophagus. Inflammation / Candidiasis.    Recent admission to [facility] for worsening shortness of breath on 03/20/2020.  He was admitted with diagnosis of chronic respiratory failure secondary to acute decompensation of heart failure.  BNP was greater than 4000.  Community-acquired pneumonia.  Was discharged on antibiotics.  He was seen by Dr. Sabra Heck cardiologist.  He was to continue Lasix for discharge.  He was to start Coreg 12.5 mg p.o. twice daily mg p.o. twice daily. [name]  He was to continue losartan 25 mg daily.  Recommended patient follow-up with primary cardiologist to  determine changing to [facility].  He also advised consideration of LifeVest with Dr. Harl Bowie.  Patient had an echocardiogram during hospital stay showing reduced ejection fraction of 15 to 20%. .  Past Medical History:  Diagnosis Date  . Asthma   . Chronic renal insufficiency   . COPD (chronic obstructive pulmonary disease) ([place])   . Heart failure (HCC)    Acute on chronic combined systolic and diastolic heart failure  . Hypertension   . Pulmonary  emboli ([place]) 03/2019  . Renal insufficiency   . Type 2 diabetes mellitus ([place])     Past Surgical History:  Procedure Laterality Date  . APPENDECTOMY    . BALLOON DILATION N/A 03/18/2020   Procedure: BALLOON DILATION;  Surgeon: Eloise Harman, DO;  [facility];  Service: Endoscopy;  Laterality: N/A;  . CHOLECYSTECTOMY    . ESOPHAGOGASTRODUODENOSCOPY (EGD) WITH PROPOFOL N/A 03/18/2020   Procedure: ESOPHAGOGASTRODUODENOSCOPY (EGD) WITH PROPOFOL;  Surgeon: Eloise Harman, DO;  Location: [facility];  Service: Endoscopy;  Laterality: N/A;  2:30pm  . NASAL SINUS SURGERY    . NEPHRECTOMY     RIGHT (Question CA.  No further therapy needed.)  . RIGHT/LEFT HEART CATH AND CORONARY ANGIOGRAPHY N/A 09/03/2019   Procedure: RIGHT/LEFT HEART CATH AND CORONARY ANGIOGRAPHY;  Surgeon: Lorretta Harp, MD;  Location: [place] CV LAB;  Service: Cardiovascular;  Laterality: N/A;    Current Outpatient Medications  Medication Sig Dispense Refill  . albuterol (PROAIR HFA) 108 (90 BASE) MCG/ACT inhaler Inhale 2 puffs into the lungs every 6 (six) hours as needed for wheezing or shortness of breath.     [name] apixaban (ELIQUIS) 5 MG TABS tablet Take 5 mg by mouth 2 (two) times daily.    . carvedilol (COREG) 12.5 MG tablet Take 12.5 mg by mouth 2 (two) times daily.    . fluconazole (DIFLUCAN) 200 MG tablet Take 400 mg by mouth daily. Till finish bottle.    . furosemide (LASIX) 20 MG tablet Take 20 mg by mouth daily.    . hydrALAZINE (APRESOLINE) 10 MG tablet TAKE ONE (1) TABLET (10 MG TOTAL) BY MOUTH EVERY 8 (EIGHT) HOURS. 270 tablet 3  . HYDROcodone-acetaminophen (NORCO) 7.5-325 MG tablet Take 1 tablet by mouth 2 (two) times daily as needed for pain.    [name] ipratropium-albuterol (DUONEB) 0.5-2.5 (3) MG/3ML SOLN Take 3 mLs by nebulization every 6 (six) hours as needed (for shortness of breath/wheezing).     [name] losartan (COZAAR) 50 MG tablet Take 50 mg by mouth daily.    . metFORMIN (GLUCOPHAGE-XR)  500 MG 24 hr tablet Take 1,000 mg by mouth 2 (two) times daily.    . Omega-3 Fatty Acids (FISH OIL) 1000 MG CAPS Take 1,000 mg by mouth daily.     . OXYGEN Inhale 2 L into the lungs at bedtime as needed (for shortness of breath).    . pantoprazole (PROTONIX) 40 MG tablet Take 1 tablet (40 mg total) by mouth daily before breakfast. 90 tablet 3  . SYMBICORT 160-4.5 MCG/ACT inhaler Inhale 2 puffs into the lungs in the morning and at bedtime.    . tamsulosin (FLOMAX) 0.4 MG CAPS capsule Take 0.4 mg by mouth daily.   0   No current facility-administered medications for this visit.   Allergies:  Amoxicillin and Ciprofloxacin   Social History: The patient  reports that he quit smoking about 43 years ago. His smoking use included cigarettes. He started smoking about 62 years ago. He has  a 15.00 pack-year smoking history. He quit smokeless tobacco use about 14 years ago.  His smokeless tobacco use included chew. He reports previous alcohol use. He reports that he does not use drugs.   Family History: The patient's family history includes Breast cancer in his mother; Lung cancer in his brother; Stroke in his mother.   ROS:  Please see the history of present illness. Otherwise, complete review of systems is positive for none.  All other systems are reviewed and negative.   Physical Exam: VS:  BP 130/70 Comment: has not take am meds yet  Pulse 83   Ht 5\' 6"  (1.676 m)   Wt 145 lb 9.6 oz (66 kg)   SpO2 90% Comment: uses oxygen at bedtime  BMI 23.50 kg/m , BMI Body mass index is 23.5 kg/m.  Wt Readings from Last 3 Encounters:  04/03/20 145 lb 9.6 oz (66 kg)  03/18/20 159 lb (72.1 kg)  02/20/20 153 lb 6.4 oz (69.6 kg)    General: Patient appears comfortable at rest. Lungs: Rhonchi heard in right lung anteriorly, mild crackles in the left upper posterior lung field.  Mild expiratory wheezing heard  right anterior lung., nonlabored breathing at rest. Cardiac: Regular rate and rhythm, no S3 or  significant systolic murmur, no pericardial rub. Extremities: No pitting edema, distal pulses 2+. Skin: Warm and dry. Musculoskeletal: No kyphosis. Neuropsychiatric: Alert and oriented x3, affect grossly appropriate.  ECG:  EKG 08/31/2019 normal sinus rhythm rate of 81, borderline prolonged PR interval, right pleural branch block and left anterior fascicular block, probable LVH.  Recent Labwork: 08/31/2019: TSH 1.017 01/03/2020: B Natriuretic Peptide 1,273.0 01/22/2020: Magnesium 2.1 02/20/2020: ALT 22; AST 19; BUN 12; Creatinine, Ser 1.39; Hemoglobin 10.8; Platelets 237; Potassium 4.1; Sodium 138  No results found for: CHOL, TRIG, HDL, CHOLHDL, VLDL, LDLCALC, LDLDIRECT  Other Studies Reviewed Today:   Echocardiogram 03/22/2020 [facility] Conclusion: 1.  Mild to moderate mitral regurgitation 2.  Mild aortic regurgitation. 3.  Moderate tricuspid regurgitation. 4.  Mild pulmonic regurgitation. 5.  Four-chamber dilatation. 6.  Pericardial effusion which is very small, nonsignificant, it is circumferential.  There is moderate pleural effusion. 7.  Global hypokinesis with reduced ejection fraction of 15 to 20%. 8.  No thrombus seen   Cardiac Catheterization 09/03/2019  IMPRESSION:Mr. Honaker has normal coronary arteries and low filling pressures suggesting that he has been adequately or over diuresed. He did have a high V waves suggesting that he potentially has significant mitral regurgitation. His systolic pressures were in the mid to high 90s and I therefore gave him a 250 cc bolus of saline given his low LVEDP and wedge. He will need guideline directed optimal medical therapy for his LV dysfunction. The sheath removed and a TR band was placed on the right wrist to achieve patent hemostasis. The patient left lab in stable condition. His renal function be carefully monitored. Dr. Davina Poke was notified of these results.   Echocardiogram 08/31/2019  1. Left ventricular  ejection fraction, by visual estimation, is 25 to 30%. The left ventricle has severely decreased function. There is no left ventricular hypertrophy. 2. Elevated left ventricular end-diastolic pressure. 3. Left ventricular diastolic parameters are consistent with Grade II diastolic dysfunction (pseudonormalization). 4. Mild to moderately dilated left ventricular internal cavity size. 5. The left ventricle demonstrates global hypokinesis. 6. Global right ventricle has normal systolic function.The right ventricular size is normal. Mildly increased right ventricular wall thickness. 7. Left atrial size was severely dilated. 8. Right atrial  size was moderately dilated. 9. The mitral valve is grossly normal. Mild mitral valve regurgitation. 10. The tricuspid valve is grossly normal. 11. The tricuspid valve is grossly normal. Tricuspid valve regurgitation is mild. 12. The aortic valve is tricuspid. Aortic valve regurgitation is not visualized. No evidence of aortic valve sclerosis or stenosis. 13. The pulmonic valve was grossly normal. Pulmonic valve regurgitation is not visualized. 14. The inferior vena cava is normal in size with greater than 50% respiratory variability, suggesting right atrial pressure of 3 mmHg.   04/2013 echo Study Conclusions  - Left ventricle: The cavity size was mildly dilated. Wall thickness was increased in a pattern of mild to moderateLVH. Systolic function was mildly reduced. The estimated ejection fraction was in the range of 45% to 50%. There is mild global hypokinesis. There was an increased relative contribution of atrial contraction to ventricular filling. Doppler parameters are consistent with abnormal left ventricular relaxation (grade 1 diastolic dysfunction). Doppler parameters are consistent with high ventricular filling pressure. - Mitral valve: Calcified annulus. Mildly thickened leaflets . Trivial to mild regurgitation. - Left  atrium: The atrium was mildly dilated. - Right ventricle: The cavity size was mildly dilated. Wall thickness was normal. Systolic function was mildly reduced. - Right atrium: The atrium was mildly dilated. - Pericardium, extracardiac: A trivial pericardial effusion was identified.   11/2011 Cath Procedural Findings:  Hemodynamics: AO157/77 LV154/22  Coronary angiography:  Coronary dominance: Right Left mainstem: Normal Left anterior descending (LAD): Large vessel wrapping the apex. Mild lumina irregularities. D1 small normal. D2 small normal. Left circumflex (LCx): AV group luminal irregularities. OM1 large and normal. PL x 2 small normal Right coronary artery (RCA): Dominant. Long mid 25%. PDA normal. PL moderate and normal.  Left ventriculography: Left ventricle not injected Final Conclusions: Mild coronary plaque. Non ischemic cardiomyopathy Recommendations: Medical management.    12/2014 [facility] Cardiolite IMPRESSION: 1. Moderate size region of inferior wall myocardial scar. No ischemic territories seen. Overlying soft tissue attenuation artifact cannot entirely be ruled out. 2. Mild inferior wall hypokinesis. 3. Left ventricular ejection fraction 48% 4. Low to intermediate-risk stress test findings*.   05/2016 echo Study Conclusions  - Left ventricle: The cavity size was normal. Wall thickness was increased in a pattern of mild LVH. Systolic function was normal. The estimated ejection fraction was in the range of 55% to 60%. Wall motion was normal; there were no regional wall motion abnormalities. Doppler parameters are consistent with abnormal left ventricular relaxation (grade 1 diastolic dysfunction). - Aortic valve: Mildly calcified annulus. Trileaflet; mildly thickened leaflets. Valve area (VTI): 4.03 cm^2. Valve area (Vmax): 4.25 cm^2. Valve area (Vmean): 4.49 cm^2. - Left  atrium: The atrium was mildly to moderately dilated. - Atrial septum: No defect or patent foramen ovale was identified. - Technically adequate study.  Assessment and Plan:   1. Essential hypertension He is normotensive today blood pressure is 130/70.  Continue hydralazine 10 mg every 8 hours.  Continue losartan 25 mg daily.   2. Acute on chronic combined systolic (congestive) and diastolic (congestive) heart failure [facility]) Recent admission to [facility] for worsening shortness of breath on 03/20/2020.  Diagnosed with pneumonia, COPD exacerbation, acute on chronic systolic heart failure.  BNP was 4958.  Echocardiogram showed decreased EF of 15 to 20%.  His carvedilol was increased to 12.5 mg p.o. twice daily upon discharge.  He currently takes losartan 50 mg daily.  He is to continue losartan.  To continue furosemide 20 mg p.o. daily.  He recently had a cardiac catheterization in January showing normal coronary arteries.  Please refer patient to advanced heart failure clinic for systolic heart failure.  3. Chest pain, unspecified type He denies any recent progressive anginal symptoms.  Had a normal cardiac catheterization in January 2021. Recently had an abnormal EGD showing erosive esophagitis.  He received antibiotics.  He continues to have chest pain when drinking or eating anything.  4. Other fatigue Recent complaints of fatigue and continued complaints of fatigue on arrival today.  Recent hemoglobin and hematocrit 03/20/2020 11.8 and 40.3.  Glucose 219, creatinine 1.36, BUN 45, GFR greater than 60.  Medication Adjustments/Labs and Tests Ordered: Current medicines are reviewed at length with the patient today.  Concerns regarding medicines are outlined above.   Disposition: Follow-up with Dr. Harl Bowie or APP 3 months.  Signed, Levell July, NP 04/03/2020 10:06 AM    [place] [facility] [place], [place], [place] [address] Phone: [phone]; Fax:  [phone]

## 2020-04-03 ENCOUNTER — Ambulatory Visit (INDEPENDENT_AMBULATORY_CARE_PROVIDER_SITE_OTHER): Payer: Medicare HMO | Admitting: Family Medicine

## 2020-04-03 ENCOUNTER — Encounter: Payer: Self-pay | Admitting: Family Medicine

## 2020-04-03 ENCOUNTER — Other Ambulatory Visit: Payer: Self-pay

## 2020-04-03 VITALS — BP 130/70 | HR 83 | Ht 66.0 in | Wt 145.6 lb

## 2020-04-03 DIAGNOSIS — I5043 Acute on chronic combined systolic (congestive) and diastolic (congestive) heart failure: Secondary | ICD-10-CM | POA: Diagnosis not present

## 2020-04-03 DIAGNOSIS — R0602 Shortness of breath: Secondary | ICD-10-CM

## 2020-04-03 NOTE — Patient Instructions (Signed)
Medication Instructions:  Continue all current medications.  Labwork: none  Testing/Procedures: none  Follow-Up: 3 months   Any Other Special Instructions Will Be Listed Below (If Applicable). You have been referred to:  Heart Failure clinic  If you need a refill on your cardiac medications before your next appointment, please call your pharmacy.

## 2020-04-10 ENCOUNTER — Telehealth: Payer: Self-pay | Admitting: Family Medicine

## 2020-04-10 NOTE — Telephone Encounter (Signed)
Reports heart medications he was given has caused him to go the hospital 3 times. Unable to give name of medication. Wife assisted with medication review. After medication review it was discovered that patient is taking carvedilol 3.125 mg BID vs 12.5 mg BID, taking losartan 25 mg daily vs 50 mg daily. Advised that we have not made any changes since June 2021 when imdur was stopped. Reports going to [facility] last night for SOB and was given ABT & prednisone. Advised that cardiac medications are to help heart pump better and treat high blood pressure. Advised that he's had multiple visit to ED/hospital and diagnosed with pneumonia several times. Advised that last Echo showed his EF was reduced 15 to 20%. Advised that both could be contributing to his SOB. Advised that referral to Heart Failure Clinic has been placed and advised that we would check on this. Advised if symptoms got worse, to go to the ED for an evaluation. Verbalized understanding of plan.

## 2020-04-10 NOTE — Telephone Encounter (Signed)
Patient called stating that he went to [facility] x 3 times since taking new medication.  States that he is having a lot of breathing difficulty.

## 2020-04-11 NOTE — Telephone Encounter (Signed)
Issues could be lung, heart, or both. From a heart standpoint clues would be any weight gain at home? Any significant LE edema? Worsening SOB with laying flat? Lung clues would be coughing/wheezing/SOB. What can he tell us as far as additional info   Zandra Abts MD

## 2020-04-13 ENCOUNTER — Encounter: Payer: Self-pay | Admitting: Gastroenterology

## 2020-04-24 NOTE — Telephone Encounter (Signed)
Reports he still has SOB and is losing weight. Denies weight gain or LE swelling. Reports seeing his PCP on yesterday and had CXR that showed pneumonia. Advised that HF Clinic Referral has been made and he would be contacted once its scheduled. Advised to contact our office for sooner appointment than 07/10/20 if needed. Verbalized understanding of plan.

## 2020-05-14 ENCOUNTER — Encounter: Payer: Self-pay | Admitting: Internal Medicine

## 2020-05-14 ENCOUNTER — Ambulatory Visit: Payer: Medicare HMO | Admitting: Gastroenterology

## 2020-05-15 ENCOUNTER — Telehealth: Payer: Self-pay | Admitting: Cardiology

## 2020-05-15 NOTE — Telephone Encounter (Signed)
Pt called stating he has chest pain every morning and it only comes on in the mornings and breathing bad. States it does not last all day. Pt states he feels week all the time and doesn't have any energy.   Please give pt a call [phone]

## 2020-05-16 NOTE — Telephone Encounter (Signed)
Reports pain in chest after eating or drinking even water. Reports he's had this problem for awhile. Advised that he needed to f/u with GI about this problem. Advised that he missed his last appointment this week 05/14/20. Gave number [phone]) to call and reschedule appointment. Verbalized understanding.

## 2020-05-20 NOTE — Telephone Encounter (Signed)
Needs GI eval not cardiac, looks like appt is tomorrow   Gregory Abts MD

## 2020-05-21 ENCOUNTER — Ambulatory Visit (INDEPENDENT_AMBULATORY_CARE_PROVIDER_SITE_OTHER): Payer: Medicare HMO | Admitting: Internal Medicine

## 2020-05-21 ENCOUNTER — Encounter: Payer: Self-pay | Admitting: Gastroenterology

## 2020-05-21 ENCOUNTER — Other Ambulatory Visit: Payer: Self-pay

## 2020-05-21 ENCOUNTER — Encounter: Payer: Self-pay | Admitting: Internal Medicine

## 2020-05-21 VITALS — BP 141/76 | HR 90 | Temp 97.8°F | Ht 66.0 in | Wt 146.0 lb

## 2020-05-21 DIAGNOSIS — R1319 Other dysphagia: Secondary | ICD-10-CM | POA: Diagnosis not present

## 2020-05-21 DIAGNOSIS — R0789 Other chest pain: Secondary | ICD-10-CM | POA: Diagnosis not present

## 2020-05-21 DIAGNOSIS — B3781 Candidal esophagitis: Secondary | ICD-10-CM

## 2020-05-21 MED ORDER — FLUCONAZOLE 200 MG PO TABS: 200.0000 mg | ORAL_TABLET | Freq: Every day | ORAL | 0 refills | Status: AC

## 2020-05-21 MED ORDER — PANTOPRAZOLE SODIUM 40 MG PO TBEC: 40.0000 mg | DELAYED_RELEASE_TABLET | Freq: Two times a day (BID) | ORAL | 5 refills | Status: DC

## 2020-05-21 NOTE — Patient Instructions (Signed)
I will treat you with a another 2-week course of Diflucan for your candidal esophagitis.    Have also refilled your pantoprazole to take 40 mg twice daily.  Follow-up with me in 2 to 3 weeks or sooner if needed.  At [facility] we value your feedback. You may receive a survey about your visit today. Please share your experience as we strive to create trusting relationships with our patients to provide genuine, compassionate, quality care.  We appreciate your understanding and patience as we review any laboratory studies, imaging, and other diagnostic tests that are ordered as we care for you. Our office policy is 5 business days for review of these results, and any emergent or urgent results are addressed in a timely manner for your best interest. If you do not hear from our office in 1 week, please contact us.   We also encourage the use of MyChart, which contains your medical information for your review as well. If you are not enrolled in this feature, an access code is on this after visit summary for your convenience. Thank you for allowing [place] to be involved in your care.  It was great to see you today!  I hope you have a great rest of your fall!!  Gerardo Territo K. Abbey Chatters, D.O. Gastroenterology and Hepatology [facility]

## 2020-05-21 NOTE — Progress Notes (Addendum)
Referring Provider: Neale Burly, MD Primary Care Physician:  Neale Burly, MD Primary GI:  Dr. Abbey Chatters  Chief Complaint  Patient presents with  . Follow-up    still having alot of pain in his chest especially after eating or drinking     HPI:   Gregory Mitchell is a 79 y.o. male who presents to the clinic today for follow-up visit. Recently underwent EGD due to dysphagia and chest pain, found to have severe candidiasis. Completed a 2-week course of Diflucan and states his symptoms are not improved. Continues to have issues with swallowing and pain. No melena or hematochezia. No change in bowel habits. Otherwise no other complaints.  Past Medical History:  Diagnosis Date  . Asthma   . Chronic renal insufficiency   . COPD (chronic obstructive pulmonary disease) ([place])   . Heart failure (HCC)    Acute on chronic combined systolic and diastolic heart failure  . Hypertension   . Pulmonary emboli ([place]) 03/2019  . Renal insufficiency   . Type 2 diabetes mellitus ([place])     Past Surgical History:  Procedure Laterality Date  . APPENDECTOMY    . BALLOON DILATION N/A 03/18/2020   Procedure: BALLOON DILATION;  Surgeon: Eloise Harman, DO;  [facility];  Service: Endoscopy;  Laterality: N/A;  . CHOLECYSTECTOMY    . COLONOSCOPY  10/2006   Dr.Anwar:normal  . ESOPHAGOGASTRODUODENOSCOPY (EGD) WITH PROPOFOL N/A 03/18/2020   Procedure: ESOPHAGOGASTRODUODENOSCOPY (EGD) WITH PROPOFOL;  Surgeon: Eloise Harman, DO;  [facility];  Service: Endoscopy;  Laterality: N/A;  2:30pm  . NASAL SINUS SURGERY    . NEPHRECTOMY     RIGHT (Question CA.  No further therapy needed.)  . RIGHT/LEFT HEART CATH AND CORONARY ANGIOGRAPHY N/A 09/03/2019   Procedure: RIGHT/LEFT HEART CATH AND CORONARY ANGIOGRAPHY;  Surgeon: Lorretta Harp, MD;  Location: [facility];  Service: Cardiovascular;  Laterality: N/A;    Current Outpatient Medications  Medication Sig Dispense Refill   . albuterol (PROAIR HFA) 108 (90 BASE) MCG/ACT inhaler Inhale 2 puffs into the lungs every 6 (six) hours as needed for wheezing or shortness of breath.     [name] apixaban (ELIQUIS) 5 MG TABS tablet Take 5 mg by mouth 2 (two) times daily.    . carvedilol (COREG) 12.5 MG tablet Take 12.5 mg by mouth 2 (two) times daily.    . furosemide (LASIX) 20 MG tablet Take 20 mg by mouth daily.    . hydrALAZINE (APRESOLINE) 10 MG tablet TAKE ONE (1) TABLET (10 MG TOTAL) BY MOUTH EVERY 8 (EIGHT) HOURS. 270 tablet 3  . HYDROcodone-acetaminophen (NORCO) 7.5-325 MG tablet Take 1 tablet by mouth 2 (two) times daily as needed for pain.    [name] ipratropium-albuterol (DUONEB) 0.5-2.5 (3) MG/3ML SOLN Take 3 mLs by nebulization every 6 (six) hours as needed (for shortness of breath/wheezing).     [name] losartan (COZAAR) 50 MG tablet Take 25 mg by mouth daily.     . metFORMIN (GLUCOPHAGE-XR) 500 MG 24 hr tablet Take 1,000 mg by mouth 2 (two) times daily.    . Omega-3 Fatty Acids (FISH OIL) 1000 MG CAPS Take 1,000 mg by mouth daily.     . OXYGEN Inhale 2 L into the lungs at bedtime as needed (for shortness of breath).    . SYMBICORT 160-4.5 MCG/ACT inhaler Inhale 2 puffs into the lungs in the morning and at bedtime.    . tamsulosin (FLOMAX) 0.4 MG  CAPS capsule Take 0.4 mg by mouth daily.   0  . fluconazole (DIFLUCAN) 200 MG tablet Take 1 tablet (200 mg total) by mouth daily for 14 days. Take 2 tablets on day one 15 tablet 0  . pantoprazole (PROTONIX) 40 MG tablet Take 1 tablet (40 mg total) by mouth 2 (two) times daily. 60 tablet 5   No current facility-administered medications for this visit.    Allergies as of 05/21/2020 - Review Complete 05/21/2020  Allergen Reaction Noted  . Amoxicillin  11/26/2011  . Ciprofloxacin  11/26/2011    Family History  Problem Relation Age of Onset  . Lung cancer Brother   . Stroke Mother   . Breast cancer Mother   . Colon cancer Neg Hx     Social History   Socioeconomic History  .  Marital status: Married    Spouse name: Not on file  . Number of children: Not on file  . Years of education: Not on file  . Highest education level: Not on file  Occupational History  . Not on file  Tobacco Use  . Smoking status: Former Smoker    Packs/day: 1.00    Years: 15.00    Pack years: 15.00    Types: Cigarettes    Start date: 07/14/1957    Quit date: 08/09/1976    Years since quitting: 43.8  . Smokeless tobacco: Former Systems developer    Types: Chew    Quit date: 02/10/2006  . Tobacco comment: chewed tobacco for 10 years  Vaping Use  . Vaping Use: Never used  Substance and Sexual Activity  . Alcohol use: Not Currently    Alcohol/week: 0.0 standard drinks    Comment: no etoh since he was in his 15s  . Drug use: Never  . Sexual activity: Yes  Other Topics Concern  . Not on file  Social History Narrative   Lives with wife.  Three children.     Social Determinants of Health   Financial Resource Strain:   . Difficulty of Paying Living Expenses: Not on file  Food Insecurity:   . Worried About Charity fundraiser in the Last Year: Not on file  . Ran Out of Food in the Last Year: Not on file  Transportation Needs:   . Lack of Transportation (Medical): Not on file  . Lack of Transportation (Non-Medical): Not on file  Physical Activity:   . Days of Exercise per Week: Not on file  . Minutes of Exercise per Session: Not on file  Stress:   . Feeling of Stress : Not on file  Social Connections:   . Frequency of Communication with Friends and Family: Not on file  . Frequency of Social Gatherings with Friends and Family: Not on file  . Attends Religious Services: Not on file  . Active Member of Clubs or Organizations: Not on file  . Attends Archivist Meetings: Not on file  . Marital Status: Not on file    Subjective: Review of Systems  Constitutional: Negative for chills and fever.  HENT: Negative for congestion and hearing loss.   Eyes: Negative for blurred vision  and double vision.  Respiratory: Negative for cough and shortness of breath.   Cardiovascular: Negative for chest pain and palpitations.  Gastrointestinal: Negative for abdominal pain, blood in stool, constipation, diarrhea, heartburn, melena and vomiting.  Genitourinary: Negative for dysuria and urgency.  Musculoskeletal: Negative for joint pain and myalgias.  Skin: Negative for itching and rash.  Neurological: Negative  for dizziness and headaches.  Psychiatric/Behavioral: Negative for depression. The patient is not nervous/anxious.      Objective: BP (!) 141/76   Pulse 90   Temp 97.8 F (36.6 C) (Temporal)   Ht 5\' 6"  (1.676 m)   Wt 146 lb (66.2 kg)   BMI 23.57 kg/m  Physical Exam Constitutional:      Appearance: Normal appearance.  HENT:     Head: Normocephalic and atraumatic.  Eyes:     Extraocular Movements: Extraocular movements intact.     Conjunctiva/sclera: Conjunctivae normal.  Cardiovascular:     Rate and Rhythm: Normal rate and regular rhythm.  Pulmonary:     Effort: Pulmonary effort is normal.     Comments: Coarse breath sounds Abdominal:     General: Bowel sounds are normal.     Palpations: Abdomen is soft.  Musculoskeletal:        General: Normal range of motion.     Cervical back: Normal range of motion and neck supple.  Skin:    General: Skin is warm.  Neurological:     General: No focal deficit present.     Mental Status: He is alert and oriented to person, place, and time.  Psychiatric:        Mood and Affect: Mood normal.        Behavior: Behavior normal.      Assessment: *Candidal esophagitis *Dysphagia *Chest pain  Plan: Patient is complicated with multiple severe comorbidities from both a cardiac and lung standpoint.  Patient with EGD findings of severe candidal esophagitis.  Mildly improved on 2-week course of Diflucan which patient states he took to completion.  Given the severity of patient's esophagitis, I will treat him for another  2 weeks and monitor for improvement.  I have also refilled his Protonix 40 mg twice daily to take 30 minutes before breakfast and bedtime.  If his symptoms do not improve as expected, we can try GI cocktails as needed for epigastric pain.  Patient follow-up with me in 3 weeks or sooner if needed.  05/21/2020 9:43 AM   Disclaimer: This note was dictated with voice recognition software. Similar sounding words can inadvertently be transcribed and may not be corrected upon review.

## 2020-06-12 ENCOUNTER — Encounter: Payer: Self-pay | Admitting: Internal Medicine

## 2020-06-12 ENCOUNTER — Ambulatory Visit: Payer: Medicare HMO | Admitting: Internal Medicine

## 2020-06-12 ENCOUNTER — Other Ambulatory Visit: Payer: Self-pay

## 2020-06-12 VITALS — BP 112/71 | HR 94 | Temp 96.9°F | Ht 66.0 in | Wt 151.0 lb

## 2020-06-12 DIAGNOSIS — R1319 Other dysphagia: Secondary | ICD-10-CM

## 2020-06-12 DIAGNOSIS — B3781 Candidal esophagitis: Secondary | ICD-10-CM

## 2020-06-12 DIAGNOSIS — K219 Gastro-esophageal reflux disease without esophagitis: Secondary | ICD-10-CM | POA: Diagnosis not present

## 2020-06-12 NOTE — Progress Notes (Addendum)
Referring Provider: Neale Burly, MD Primary Care Physician:  Neale Burly, MD Primary GI:  Dr. Abbey Chatters  Chief Complaint  Patient presents with  . Dysphagia    ok    HPI:   Gregory Mitchell is a 79 y.o. male who presents to the clinic today for follow-up visit.  Underwent EGD in August 2021 for odynophagia, chest pain, found to have severe candidal esophagitis.  Noted only mild improvement after first 2-week course of Diflucan.  Given the severity of his esophagitis I started him on a second 2-week course of Diflucan he states his symptoms today are much improved.  No longer having chest pain or odynophagia.  Past Medical History:  Diagnosis Date  . Asthma   . Chronic renal insufficiency   . COPD (chronic obstructive pulmonary disease) ([place])   . Heart failure (HCC)    Acute on chronic combined systolic and diastolic heart failure  . Hypertension   . Pulmonary emboli ([place]) 03/2019  . Renal insufficiency   . Type 2 diabetes mellitus ([place])     Past Surgical History:  Procedure Laterality Date  . APPENDECTOMY    . BALLOON DILATION N/A 03/18/2020   Procedure: BALLOON DILATION;  Surgeon: Eloise Harman, DO;  [facility];  Service: Endoscopy;  Laterality: N/A;  . CHOLECYSTECTOMY    . COLONOSCOPY  10/2006   Dr.Anwar:normal  . ESOPHAGOGASTRODUODENOSCOPY (EGD) WITH PROPOFOL N/A 03/18/2020   Procedure: ESOPHAGOGASTRODUODENOSCOPY (EGD) WITH PROPOFOL;  Surgeon: Eloise Harman, DO;  [facility];  Service: Endoscopy;  Laterality: N/A;  2:30pm  . NASAL SINUS SURGERY    . NEPHRECTOMY     RIGHT (Question CA.  No further therapy needed.)  . RIGHT/LEFT HEART CATH AND CORONARY ANGIOGRAPHY N/A 09/03/2019   Procedure: RIGHT/LEFT HEART CATH AND CORONARY ANGIOGRAPHY;  Surgeon: Lorretta Harp, MD;  Location: [facility];  Service: Cardiovascular;  Laterality: N/A;    Current Outpatient Medications  Medication Sig Dispense Refill  . albuterol (PROAIR  HFA) 108 (90 BASE) MCG/ACT inhaler Inhale 2 puffs into the lungs every 6 (six) hours as needed for wheezing or shortness of breath.     [name] apixaban (ELIQUIS) 5 MG TABS tablet Take 5 mg by mouth 2 (two) times daily.    . carvedilol (COREG) 12.5 MG tablet Take 12.5 mg by mouth 2 (two) times daily.    . furosemide (LASIX) 20 MG tablet Take 20 mg by mouth as needed.     . hydrALAZINE (APRESOLINE) 10 MG tablet TAKE ONE (1) TABLET (10 MG TOTAL) BY MOUTH EVERY 8 (EIGHT) HOURS. 270 tablet 3  . HYDROcodone-acetaminophen (NORCO) 7.5-325 MG tablet Take 1 tablet by mouth 2 (two) times daily as needed for pain.    [name] ipratropium-albuterol (DUONEB) 0.5-2.5 (3) MG/3ML SOLN Take 3 mLs by nebulization every 6 (six) hours as needed (for shortness of breath/wheezing).     [name] losartan (COZAAR) 50 MG tablet Take 25 mg by mouth daily.     . metFORMIN (GLUCOPHAGE-XR) 500 MG 24 hr tablet Take 1,000 mg by mouth 2 (two) times daily.    . Omega-3 Fatty Acids (FISH OIL) 1000 MG CAPS Take 1,000 mg by mouth daily.     . OXYGEN Inhale 2 L into the lungs at bedtime as needed (for shortness of breath).    . pantoprazole (PROTONIX) 40 MG tablet Take 1 tablet (40 mg total) by mouth 2 (two) times daily. 60 tablet 5  . SYMBICORT  160-4.5 MCG/ACT inhaler Inhale 2 puffs into the lungs in the morning and at bedtime.    . tamsulosin (FLOMAX) 0.4 MG CAPS capsule Take 0.4 mg by mouth daily.   0   No current facility-administered medications for this visit.    Allergies as of 06/12/2020 - Review Complete 06/12/2020  Allergen Reaction Noted  . Amoxicillin  11/26/2011  . Ciprofloxacin  11/26/2011    Family History  Problem Relation Age of Onset  . Lung cancer Brother   . Stroke Mother   . Breast cancer Mother   . Colon cancer Neg Hx     Social History   Socioeconomic History  . Marital status: Married    Spouse name: Not on file  . Number of children: Not on file  . Years of education: Not on file  . Highest education level:  Not on file  Occupational History  . Not on file  Tobacco Use  . Smoking status: Former Smoker    Packs/day: 1.00    Years: 15.00    Pack years: 15.00    Types: Cigarettes    Start date: 07/14/1957    Quit date: 08/09/1976    Years since quitting: 43.8  . Smokeless tobacco: Former Systems developer    Types: Chew    Quit date: 02/10/2006  . Tobacco comment: chewed tobacco for 10 years  Vaping Use  . Vaping Use: Never used  Substance and Sexual Activity  . Alcohol use: Not Currently    Alcohol/week: 0.0 standard drinks    Comment: no etoh since he was in his 85s  . Drug use: Never  . Sexual activity: Yes  Other Topics Concern  . Not on file  Social History Narrative   Lives with wife.  Three children.     Social Determinants of Health   Financial Resource Strain:   . Difficulty of Paying Living Expenses: Not on file  Food Insecurity:   . Worried About Charity fundraiser in the Last Year: Not on file  . Ran Out of Food in the Last Year: Not on file  Transportation Needs:   . Lack of Transportation (Medical): Not on file  . Lack of Transportation (Non-Medical): Not on file  Physical Activity:   . Days of Exercise per Week: Not on file  . Minutes of Exercise per Session: Not on file  Stress:   . Feeling of Stress : Not on file  Social Connections:   . Frequency of Communication with Friends and Family: Not on file  . Frequency of Social Gatherings with Friends and Family: Not on file  . Attends Religious Services: Not on file  . Active Member of Clubs or Organizations: Not on file  . Attends Archivist Meetings: Not on file  . Marital Status: Not on file    Subjective: Review of Systems  Constitutional: Negative for chills and fever.  HENT: Negative for congestion and hearing loss.   Eyes: Negative for blurred vision and double vision.  Respiratory: Negative for cough and shortness of breath.   Cardiovascular: Negative for chest pain and palpitations.   Gastrointestinal: Negative for abdominal pain, blood in stool, constipation, diarrhea, heartburn, melena and vomiting.       Acid reflux  Genitourinary: Negative for dysuria and urgency.  Musculoskeletal: Negative for joint pain and myalgias.  Skin: Negative for itching and rash.  Neurological: Negative for dizziness and headaches.  Psychiatric/Behavioral: Negative for depression. The patient is not nervous/anxious.  Objective: BP 112/71   Pulse 94   Temp (!) 96.9 F (36.1 C) (Temporal)   Ht 5\' 6"  (1.676 m)   Wt 151 lb (68.5 kg)   BMI 24.37 kg/m  Physical Exam Constitutional:      Appearance: Normal appearance.  HENT:     Head: Normocephalic and atraumatic.  Eyes:     Extraocular Movements: Extraocular movements intact.     Conjunctiva/sclera: Conjunctivae normal.  Cardiovascular:     Rate and Rhythm: Normal rate and regular rhythm.  Pulmonary:     Effort: Pulmonary effort is normal.     Comments: Few crackles Abdominal:     General: Bowel sounds are normal.     Palpations: Abdomen is soft.  Musculoskeletal:        General: Normal range of motion.     Cervical back: Normal range of motion and neck supple.  Skin:    General: Skin is warm.  Neurological:     General: No focal deficit present.     Mental Status: He is alert and oriented to person, place, and time.  Psychiatric:        Mood and Affect: Mood normal.        Behavior: Behavior normal.      Assessment: *Candidal esophagitis *Dysphagia-resolved *Chest pain-resolved *Acid reflux  Plan: Symptomatically, patient states he is doing much better.  Denies any further chest pain or trouble swallowing after receiving his second course of Diflucan for his severe candidal esophagitis.  Does continue to have mild breakthrough acid reflux on pantoprazole 40 mg twice daily.  I will trial him on Dexilant 60 mg daily to see if this works better.  Samples provided in office today.  If he notes this works better  he will call office and I will send in a formal prescription.  Patient to follow-up with GI in 6 months or sooner if needed.  06/12/2020 9:40 AM   Disclaimer: This note was dictated with voice recognition software. Similar sounding words Mitchell inadvertently be transcribed and may not be corrected upon review.

## 2020-06-12 NOTE — Patient Instructions (Signed)
I am happy that you are feeling better.  For your breakthrough acid reflux, I am going to trial you on a new medication called Dexilant 60 mg daily.  I want you to take it 30 minutes before breakfast.  Do not take your pantoprazole with this medication.  If you notice an improvement in your symptoms, please call office and I will send in a formal prescription.  Follow-up with GI in 6 months or sooner if needed.  At [facility] we value your feedback. You may receive a survey about your visit today. Please share your experience as we strive to create trusting relationships with our patients to provide genuine, compassionate, quality care.  We appreciate your understanding and patience as we review any laboratory studies, imaging, and other diagnostic tests that are ordered as we care for you. Our office policy is 5 business days for review of these results, and any emergent or urgent results are addressed in a timely manner for your best interest. If you do not hear from our office in 1 week, please contact us.   We also encourage the use of MyChart, which contains your medical information for your review as well. If you are not enrolled in this feature, an access code is on this after visit summary for your convenience. Thank you for allowing [place] to be involved in your care.  It was great to see you today!  I hope you have a great rest of your fall!!   Jeanenne Licea K. Abbey Chatters, D.O. Gastroenterology and Hepatology [facility]

## 2020-06-13 ENCOUNTER — Inpatient Hospital Stay (HOSPITAL_COMMUNITY): Admission: RE | Admit: 2020-06-13 | Payer: Medicare HMO | Source: Ambulatory Visit | Admitting: Cardiology

## 2020-06-23 ENCOUNTER — Other Ambulatory Visit: Payer: Self-pay | Admitting: Cardiology

## 2020-06-29 NOTE — Progress Notes (Deleted)
Cardiology Office Note  Date: 06/29/2020   ID: Gregory Mitchell, DOB [birth year], MRN [id number]  PCP:  Neale Burly, MD  Cardiologist:  Carlyle Dolly, MD Electrophysiologist:  None   Chief Complaint: Follow-up chronic HFrEF, HTN, HLD, bilateral PE  History of Present Illness: Gregory Mitchell is a 79 y.o. male with a history of  chronic HFrEF, HTN, HLD, bilateral PE, COPD, DM type II, chronic hypoxic respiratory failure, stage IIIa chronic kidney disease  Hospital admission on 08/31/2019 for complaints of dyspnea, orthopnea, chest pressure.  Admitted with acute on chronic systolic heart failure.  Received aggressive diuresis with IV furosemide with good response.  Cardiac catheterization on 09/03/2019 showed normal coronary arteries, echo showed reduced EF of 25 to 30% with LV global hypokinesis.  Previous echo in 2017 showed EF 55 to 60%.   Last seen by me on 01/03/2019 complaining of increasing episodes of chest pain usually in the morning it feels like a tightness.  Stated it comes and goes.  He went to an urgent care facility in [place] and was prescribed doxycycline for bronchitis-like infection.  He stated his shortness of breath was getting worse.  He denied any weight gain or lower extremity edema.  Chest x-ray was ordered along with BNP. BNP was elevated at 1200.  His Lasix was increased to 20 mg twice a day for the next week.  Chest x-ray showed evidence of acute bronchitis, COPD and possibility of pulmonary edema or atypical viral pneumonia but these were felt to be less likely.  Had chronic hyperinflation of the lungs impression was COPD/emphysema.  He called on 01/09/2020 reporting feeling tired and sluggish that day.  He stated his chest pain and shortness of breath were better.  He denied any dizziness but reported not drinking a lot of fluids.  He was advised to increase fluid intake and to take his second dose of Lasix around 1-2 PM and eat some bananas and drink orange juice  and if symptoms became worse to go to the emergency room for an evaluation.   At last visit his chest pain and shortness of breath had improved.  Stated he just felt tired all the time.  He stated his allergies were bothering his breathing.  Stated he gets up in the morning and does not initial nebulizer treatment every morning due to feeling short of breath.  Stated his primary care provider just started him on doxycycline and prednisone for respiratory infection.  Stated he was feeling a little better since he started the antibiotic.  Otherwise he stated he just felt tired all the time. He had some recent lab work which showed some anemia with hemoglobin of 10.4 and hematocrit 36.6.  He denies any bleeding issues.  Stated he did have occasional black stools.  EGD on 03/18/2020 Dr Abbey Chatters : Moderately severe esophagitis with no bleeding was found in the middle third of the esophagus. Inflammation / Candidiasis.    Recent admission to [facility] for worsening shortness of breath on 03/20/2020.  He was admitted with diagnosis of chronic respiratory failure secondary to acute decompensation of heart failure.  BNP was greater than 4000.  Community-acquired pneumonia.  Was discharged on antibiotics.  He was seen by Dr. Sabra Heck cardiologist.  He was to continue Lasix for discharge.  He was to start Coreg 12.5 mg p.o. twice daily mg p.o. twice daily. [name]  He was to continue losartan 25 mg daily.  Recommended patient follow-up with primary cardiologist to  determine changing to [facility].  He also advised consideration of LifeVest with Dr. Harl Bowie.  Patient had an echocardiogram during hospital stay showing reduced ejection fraction of 15 to 20%.  Presented to [facility] emergency room 06/21/2020 with shortness of breath, chest pain, and a fall.  Final diagnosis was pneumonia.  CT scan showed evidence of small PE and bilateral infectious process in both lungs.  He was prescribed Omnicef and  azithromycin.  Recent admission to [facility] healthcare for CHF exacerbation, COPD, pulmonary emboli.  He presented to the emergency room on 06/26/2020.  He received IV diuresis with Lasix 40 mg.  His troponins were cycled and peaked at 80.  He was discharged the following day. .  Past Medical History:  Diagnosis Date  . Asthma   . Chronic renal insufficiency   . COPD (chronic obstructive pulmonary disease) ([place])   . Heart failure (HCC)    Acute on chronic combined systolic and diastolic heart failure  . Hypertension   . Pulmonary emboli ([place]) 03/2019  . Renal insufficiency   . Type 2 diabetes mellitus ([place])     Past Surgical History:  Procedure Laterality Date  . APPENDECTOMY    . BALLOON DILATION N/A 03/18/2020   Procedure: BALLOON DILATION;  Surgeon: Eloise Harman, DO;  Location: [facility] END[facility];  Service: Endoscopy;  Laterality: N/A;  . CHOLECYSTECTOMY    . COLONOSCOPY  10/2006   Dr.Anwar:normal  . ESOPHAGOGASTRODUODENOSCOPY (EGD) WITH PROPOFOL N/A 03/18/2020   Procedure: ESOPHAGOGASTRODUODENOSCOPY (EGD) WITH PROPOFOL;  Surgeon: Eloise Harman, DO;  Location: [facility] ENDO SU[facility];  Service: Endoscopy;  Laterality: N/A;  2:30pm  . NASAL SINUS SURGERY    . NEPHRECTOMY     RIGHT (Question CA.  No further therapy needed.)  . RIGHT/LEFT HEART CATH AND CORONARY ANGIOGRAPHY N/A 09/03/2019   Procedure: RIGHT/LEFT HEART CATH AND CORONARY ANGIOGRAPHY;  Surgeon: Lorretta Harp, MD;  Location: [place] CV LAB;  Service: Cardiovascular;  Laterality: N/A;    Current Outpatient Medications  Medication Sig Dispense Refill  . albuterol (PROAIR HFA) 108 (90 BASE) MCG/ACT inhaler Inhale 2 puffs into the lungs every 6 (six) hours as needed for wheezing or shortness of breath.     [name] apixaban (ELIQUIS) 5 MG TABS tablet Take 5 mg by mouth 2 (two) times daily.    . carvedilol (COREG) 12.5 MG tablet Take 12.5 mg by mouth 2 (two) times daily.    . furosemide (LASIX) 20 MG tablet TAKE ONE (1) TABLET (20  MG TOTAL) BY MOUTH DAILY AS NEEDED FOR FLUID OR EDEMA (TAKE IN CASE OF LEG SWELLING OR WEIGHT GAIN THREE (3) LBS IN 24 HOURS OR FIVE (5) LBS IN 7 DAYS.). 30 tablet 1  . hydrALAZINE (APRESOLINE) 10 MG tablet TAKE ONE (1) TABLET (10 MG TOTAL) BY MOUTH EVERY 8 (EIGHT) HOURS. 270 tablet 3  . HYDROcodone-acetaminophen (NORCO) 7.5-325 MG tablet Take 1 tablet by mouth 2 (two) times daily as needed for pain.    [name] ipratropium-albuterol (DUONEB) 0.5-2.5 (3) MG/3ML SOLN Take 3 mLs by nebulization every 6 (six) hours as needed (for shortness of breath/wheezing).     [name] losartan (COZAAR) 50 MG tablet Take 25 mg by mouth daily.     . metFORMIN (GLUCOPHAGE-XR) 500 MG 24 hr tablet Take 1,000 mg by mouth 2 (two) times daily.    . Omega-3 Fatty Acids (FISH OIL) 1000 MG CAPS Take 1,000 mg by mouth daily.     . OXYGEN Inhale 2 L into the lungs  at bedtime as needed (for shortness of breath).    . pantoprazole (PROTONIX) 40 MG tablet Take 1 tablet (40 mg total) by mouth 2 (two) times daily. 60 tablet 5  . SYMBICORT 160-4.5 MCG/ACT inhaler Inhale 2 puffs into the lungs in the morning and at bedtime.    . tamsulosin (FLOMAX) 0.4 MG CAPS capsule Take 0.4 mg by mouth daily.   0   No current facility-administered medications for this visit.   Allergies:  Amoxicillin and Ciprofloxacin   Social History: The patient  reports that he quit smoking about 43 years ago. His smoking use included cigarettes. He started smoking about 63 years ago. He has a 15.00 pack-year smoking history. He quit smokeless tobacco use about 14 years ago.  His smokeless tobacco use included chew. He reports previous alcohol use. He reports that he does not use drugs.   Family History: The patient's family history includes Breast cancer in his mother; Lung cancer in his brother; Stroke in his mother.   ROS:  Please see the history of present illness. Otherwise, complete review of systems is positive for none.  All other systems are reviewed and  negative.   Physical Exam: VS:  There were no vitals taken for this visit., BMI There is no height or weight on file to calculate BMI.  Wt Readings from Last 3 Encounters:  06/12/20 151 lb (68.5 kg)  05/21/20 146 lb (66.2 kg)  04/03/20 145 lb 9.6 oz (66 kg)    General: Patient appears comfortable at rest. Lungs: Rhonchi heard in right lung anteriorly, mild crackles in the left upper posterior lung field.  Mild expiratory wheezing heard  right anterior lung., nonlabored breathing at rest. Cardiac: Regular rate and rhythm, no S3 or significant systolic murmur, no pericardial rub. Extremities: No pitting edema, distal pulses 2+. Skin: Warm and dry. Musculoskeletal: No kyphosis. Neuropsychiatric: Alert and oriented x3, affect grossly appropriate.  ECG:  EKG 08/31/2019 normal sinus rhythm rate of 81, borderline prolonged PR interval, right pleural branch block and left anterior fascicular block, probable LVH.  Recent Labwork: 08/31/2019: TSH 1.017 01/03/2020: B Natriuretic Peptide 1,273.0 01/22/2020: Magnesium 2.1 02/20/2020: ALT 22; AST 19; BUN 12; Creatinine, Ser 1.39; Hemoglobin 10.8; Platelets 237; Potassium 4.1; Sodium 138  No results found for: CHOL, TRIG, HDL, CHOLHDL, VLDL, LDLCALC, LDLDIRECT  Other Studies Reviewed Today:   Echocardiogram 03/22/2020 [facility] Conclusion: 1.  Mild to moderate mitral regurgitation 2.  Mild aortic regurgitation. 3.  Moderate tricuspid regurgitation. 4.  Mild pulmonic regurgitation. 5.  Four-chamber dilatation. 6.  Pericardial effusion which is very small, nonsignificant, it is circumferential.  There is moderate pleural effusion. 7.  Global hypokinesis with reduced ejection fraction of 15 to 20%. 8.  No thrombus seen   Cardiac Catheterization 09/03/2019  IMPRESSION:Mr. Egerton has normal coronary arteries and low filling pressures suggesting that he has been adequately or over diuresed. He did have a high V waves suggesting that  he potentially has significant mitral regurgitation. His systolic pressures were in the mid to high 90s and I therefore gave him a 250 cc bolus of saline given his low LVEDP and wedge. He will need guideline directed optimal medical therapy for his LV dysfunction. The sheath removed and a TR band was placed on the right wrist to achieve patent hemostasis. The patient left lab in stable condition. His renal function be carefully monitored. Dr. Davina Poke was notified of these results.   Echocardiogram 08/31/2019  1. Left ventricular ejection fraction, by  visual estimation, is 25 to 30%. The left ventricle has severely decreased function. There is no left ventricular hypertrophy. 2. Elevated left ventricular end-diastolic pressure. 3. Left ventricular diastolic parameters are consistent with Grade II diastolic dysfunction (pseudonormalization). 4. Mild to moderately dilated left ventricular internal cavity size. 5. The left ventricle demonstrates global hypokinesis. 6. Global right ventricle has normal systolic function.The right ventricular size is normal. Mildly increased right ventricular wall thickness. 7. Left atrial size was severely dilated. 8. Right atrial size was moderately dilated. 9. The mitral valve is grossly normal. Mild mitral valve regurgitation. 10. The tricuspid valve is grossly normal. 11. The tricuspid valve is grossly normal. Tricuspid valve regurgitation is mild. 12. The aortic valve is tricuspid. Aortic valve regurgitation is not visualized. No evidence of aortic valve sclerosis or stenosis. 13. The pulmonic valve was grossly normal. Pulmonic valve regurgitation is not visualized. 14. The inferior vena cava is normal in size with greater than 50% respiratory variability, suggesting right atrial pressure of 3 mmHg.   04/2013 echo Study Conclusions  - Left ventricle: The cavity size was mildly dilated. Wall thickness was increased in a pattern of mild  to moderateLVH. Systolic function was mildly reduced. The estimated ejection fraction was in the range of 45% to 50%. There is mild global hypokinesis. There was an increased relative contribution of atrial contraction to ventricular filling. Doppler parameters are consistent with abnormal left ventricular relaxation (grade 1 diastolic dysfunction). Doppler parameters are consistent with high ventricular filling pressure. - Mitral valve: Calcified annulus. Mildly thickened leaflets . Trivial to mild regurgitation. - Left atrium: The atrium was mildly dilated. - Right ventricle: The cavity size was mildly dilated. Wall thickness was normal. Systolic function was mildly reduced. - Right atrium: The atrium was mildly dilated. - Pericardium, extracardiac: A trivial pericardial effusion was identified.   11/2011 Cath Procedural Findings:  Hemodynamics: AO157/77 LV154/22  Coronary angiography:  Coronary dominance: Right Left mainstem: Normal Left anterior descending (LAD): Large vessel wrapping the apex. Mild lumina irregularities. D1 small normal. D2 small normal. Left circumflex (LCx): AV group luminal irregularities. OM1 large and normal. PL x 2 small normal Right coronary artery (RCA): Dominant. Long mid 25%. PDA normal. PL moderate and normal.  Left ventriculography: Left ventricle not injected Final Conclusions: Mild coronary plaque. Non ischemic cardiomyopathy Recommendations: Medical management.    12/2014 Exercise [facility]iolite IMPRESSION: 1. Moderate size region of inferior wall myocardial scar. No ischemic territories seen. Overlying soft tissue attenuation artifact cannot entirely be ruled out. 2. Mild inferior wall hypokinesis. 3. Left ventricular ejection fraction 48% 4. Low to intermediate-risk stress test findings*.   05/2016 echo Study Conclusions  - Left ventricle: The  cavity size was normal. Wall thickness was increased in a pattern of mild LVH. Systolic function was normal. The estimated ejection fraction was in the range of 55% to 60%. Wall motion was normal; there were no regional wall motion abnormalities. Doppler parameters are consistent with abnormal left ventricular relaxation (grade 1 diastolic dysfunction). - Aortic valve: Mildly calcified annulus. Trileaflet; mildly thickened leaflets. Valve area (VTI): 4.03 cm^2. Valve area (Vmax): 4.25 cm^2. Valve area (Vmean): 4.49 cm^2. - Left atrium: The atrium was mildly to moderately dilated. - Atrial septum: No defect or patent foramen ovale was identified. - Technically adequate study.  Assessment and Plan:   1. Essential hypertension He is normotensive today blood pressure is 130/70.  Continue hydralazine 10 mg every 8 hours.  Continue losartan 25 mg daily.   2. Acute on chronic  combined systolic (congestive) and diastolic (congestive) heart failure [facility]) Recent admission to [facility] for worsening shortness of breath on 03/20/2020.  Diagnosed with pneumonia, COPD exacerbation, acute on chronic systolic heart failure.  BNP was 4958.  Echocardiogram showed decreased EF of 15 to 20%.  His carvedilol was increased to 12.5 mg p.o. twice daily upon discharge.  He currently takes losartan 50 mg daily.  He is to continue losartan.  To continue furosemide 20 mg p.o. daily.    He recently had a cardiac catheterization in January showing normal coronary arteries.  Please refer patient to advanced heart failure clinic for systolic heart failure.  3. Chest pain, unspecified type He denies any recent progressive anginal symptoms.  Had a normal cardiac catheterization in January 2021. Recently had an abnormal EGD showing erosive esophagitis.  He received antibiotics.  He continues to have chest pain when drinking or eating anything.  4. Other fatigue Recent complaints of fatigue and  continued complaints of fatigue on arrival today.  Recent hemoglobin and hematocrit 03/20/2020 11.8 and 40.3.  Glucose 219, creatinine 1.36, BUN 45, GFR greater than 60.  Medication Adjustments/Labs and Tests Ordered: Current medicines are reviewed at length with the patient today.  Concerns regarding medicines are outlined above.   Disposition: Follow-up with Dr. Harl Bowie or APP 3 months.  Signed, Levell July, NP 06/29/2020 8:24 PM    [facility] [place], [place], [place] [address] Phone: [phone]; Fax: [phone]

## 2020-06-30 ENCOUNTER — Telehealth: Payer: Self-pay | Admitting: Cardiology

## 2020-06-30 ENCOUNTER — Ambulatory Visit: Payer: Medicare HMO | Admitting: Family Medicine

## 2020-06-30 DIAGNOSIS — J439 Emphysema, unspecified: Secondary | ICD-10-CM

## 2020-06-30 DIAGNOSIS — R5383 Other fatigue: Secondary | ICD-10-CM

## 2020-06-30 DIAGNOSIS — I5042 Chronic combined systolic (congestive) and diastolic (congestive) heart failure: Secondary | ICD-10-CM

## 2020-06-30 DIAGNOSIS — R079 Chest pain, unspecified: Secondary | ICD-10-CM

## 2020-06-30 DIAGNOSIS — I10 Essential (primary) hypertension: Secondary | ICD-10-CM

## 2020-06-30 NOTE — Telephone Encounter (Signed)
Wife reports SOB is worse now than d/c from hospital [facility]) on Saturday, reports weakness, chest pain rated 9/10. Wife request to keep appointment today with AQ. Advised wife that with these symptoms, patient needed an ED evaluation. Wife insist on keeping visit today at our office. Advised that with symptoms described, ED is the best option to evaluate patient. Wife request that nurse speak with patient to discuss.  Spoke with patient to confirm symptoms who reports he does not have worsening SOB nor chest pain, dizziness. Patient did not sound SOB. Reports he is able to walk and does feel better than he did while inpatient at [facility]. Patient says he wanted to keep appointment with Dr. Harl Bowie today at 11:30 am. Advised patient that his appointment is not with Dr. Harl Bowie but with Levell July, NP. Patient request to cancel appointment today and that he would keep appointment in HF Clinic on 07/16/2020. Advised patient that he could keep appointment with Leonides Sake today since it appeared that he was stable and not having worsening symptoms. Patient said again, "I would like for today's appointment to be canceled and I will f/u with HF Clinic and if I have symptoms again before then, I will go the ED for an evaluation at [facility]."

## 2020-06-30 NOTE — Telephone Encounter (Signed)
Per phone call from pt's wife [name] - pt can barely walk, he's weak, has no energy. Patient was seen at [facility] and was told to f/u with his heart Dr. And that he needs to be seen for his heart failure.   Please call [phone]

## 2020-07-06 ENCOUNTER — Encounter (HOSPITAL_COMMUNITY): Payer: Self-pay

## 2020-07-06 ENCOUNTER — Other Ambulatory Visit: Payer: Self-pay

## 2020-07-06 ENCOUNTER — Emergency Department (HOSPITAL_COMMUNITY): Payer: Medicare HMO

## 2020-07-06 ENCOUNTER — Emergency Department (HOSPITAL_COMMUNITY)
Admission: EM | Admit: 2020-07-06 | Discharge: 2020-07-06 | Disposition: A | Discharge status: Home / Self Care | Payer: Medicare HMO | Attending: Emergency Medicine | Admitting: Emergency Medicine

## 2020-07-06 DIAGNOSIS — Z87891 Personal history of nicotine dependence: Secondary | ICD-10-CM | POA: Insufficient documentation

## 2020-07-06 DIAGNOSIS — N178 Other acute kidney failure: Secondary | ICD-10-CM | POA: Insufficient documentation

## 2020-07-06 DIAGNOSIS — Z79899 Other long term (current) drug therapy: Secondary | ICD-10-CM | POA: Diagnosis not present

## 2020-07-06 DIAGNOSIS — I5020 Unspecified systolic (congestive) heart failure: Secondary | ICD-10-CM | POA: Insufficient documentation

## 2020-07-06 DIAGNOSIS — Z794 Long term (current) use of insulin: Secondary | ICD-10-CM | POA: Insufficient documentation

## 2020-07-06 DIAGNOSIS — J189 Pneumonia, unspecified organism: Secondary | ICD-10-CM | POA: Diagnosis not present

## 2020-07-06 DIAGNOSIS — R079 Chest pain, unspecified: Secondary | ICD-10-CM | POA: Diagnosis present

## 2020-07-06 DIAGNOSIS — J441 Chronic obstructive pulmonary disease with (acute) exacerbation: Secondary | ICD-10-CM | POA: Diagnosis not present

## 2020-07-06 DIAGNOSIS — I110 Hypertensive heart disease with heart failure: Secondary | ICD-10-CM | POA: Diagnosis not present

## 2020-07-06 DIAGNOSIS — Z20822 Contact with and (suspected) exposure to covid-19: Secondary | ICD-10-CM | POA: Diagnosis not present

## 2020-07-06 DIAGNOSIS — E119 Type 2 diabetes mellitus without complications: Secondary | ICD-10-CM | POA: Insufficient documentation

## 2020-07-06 LAB — CBC
HCT: 33.5 % — ABNORMAL LOW (ref 39.0–52.0)
Hemoglobin: 9.7 g/dL — ABNORMAL LOW (ref 13.0–17.0)
MCH: 27 pg (ref 26.0–34.0)
MCHC: 29 g/dL — ABNORMAL LOW (ref 30.0–36.0)
MCV: 93.3 fL (ref 80.0–100.0)
Platelets: 303 10*3/uL (ref 150–400)
RBC: 3.59 MIL/uL — ABNORMAL LOW (ref 4.22–5.81)
RDW: 15.6 % — ABNORMAL HIGH (ref 11.5–15.5)
WBC: 6.7 10*3/uL (ref 4.0–10.5)
nRBC: 0 % (ref 0.0–0.2)

## 2020-07-06 LAB — BASIC METABOLIC PANEL
Anion gap: 7 (ref 5–15)
BUN: 19 mg/dL (ref 8–23)
CO2: 26 mmol/L (ref 22–32)
Calcium: 8.7 mg/dL — ABNORMAL LOW (ref 8.9–10.3)
Chloride: 104 mmol/L (ref 98–111)
Creatinine, Ser: 1.31 mg/dL — ABNORMAL HIGH (ref 0.61–1.24)
GFR, Estimated: 55 mL/min — ABNORMAL LOW (ref >60)
Glucose, Bld: 145 mg/dL — ABNORMAL HIGH (ref 70–99)
Potassium: 4.5 mmol/L (ref 3.5–5.1)
Sodium: 137 mmol/L (ref 135–145)

## 2020-07-06 LAB — HEPATIC FUNCTION PANEL
ALT: 22 U/L (ref 0–44)
AST: 21 U/L (ref 15–41)
Albumin: 3.4 g/dL — ABNORMAL LOW (ref 3.5–5.0)
Alkaline Phosphatase: 57 U/L (ref 38–126)
Bilirubin, Direct: 0.1 mg/dL (ref 0.0–0.2)
Indirect Bilirubin: 0.2 mg/dL — ABNORMAL LOW (ref 0.3–0.9)
Total Bilirubin: 0.3 mg/dL (ref 0.3–1.2)
Total Protein: 6.5 g/dL (ref 6.5–8.1)

## 2020-07-06 LAB — RESP PANEL BY RT-PCR (FLU A&B, COVID) ARPGX2
Influenza A by PCR: NEGATIVE
Influenza B by PCR: NEGATIVE
SARS Coronavirus 2 by RT PCR: NEGATIVE

## 2020-07-06 LAB — TROPONIN I (HIGH SENSITIVITY)
Troponin I (High Sensitivity): 41 ng/L — ABNORMAL HIGH (ref <18)
Troponin I (High Sensitivity): 37 ng/L — ABNORMAL HIGH (ref <18)

## 2020-07-06 LAB — POC OCCULT BLOOD, ED: Fecal Occult Bld: NEGATIVE

## 2020-07-06 LAB — BRAIN NATRIURETIC PEPTIDE: B Natriuretic Peptide: 1124 pg/mL — ABNORMAL HIGH (ref 0.0–100.0)

## 2020-07-06 MED ORDER — FAMOTIDINE 20 MG PO TABS
20.0000 mg | ORAL_TABLET | Freq: Once | ORAL | Status: AC
  Administered 2020-07-06: 20 mg via ORAL
  Filled 2020-07-06: qty 1

## 2020-07-06 MED ORDER — ALBUTEROL SULFATE HFA 108 (90 BASE) MCG/ACT IN AERS
2.0000 | INHALATION_SPRAY | Freq: Once | RESPIRATORY_TRACT | Status: AC
  Administered 2020-07-06: 2 via RESPIRATORY_TRACT
  Filled 2020-07-06: qty 6.7

## 2020-07-06 MED ORDER — IOHEXOL 350 MG/ML SOLN
75.0000 mL | Freq: Once | INTRAVENOUS | Status: AC | PRN
  Administered 2020-07-06: 75 mL via INTRAVENOUS

## 2020-07-06 MED ORDER — AMOXICILLIN-POT CLAVULANATE 875-125 MG PO TABS: 1.0000 | ORAL_TABLET | Freq: Two times a day (BID) | ORAL | 0 refills | Status: DC

## 2020-07-06 MED ORDER — FUROSEMIDE 10 MG/ML IJ SOLN
40.0000 mg | Freq: Once | INTRAMUSCULAR | Status: AC
  Administered 2020-07-06: 40 mg via INTRAVENOUS
  Filled 2020-07-06: qty 4

## 2020-07-06 MED ORDER — AMOXICILLIN-POT CLAVULANATE 875-125 MG PO TABS
1.0000 | ORAL_TABLET | Freq: Once | ORAL | Status: AC
  Administered 2020-07-06: 1 via ORAL
  Filled 2020-07-06: qty 1

## 2020-07-06 NOTE — Discharge Instructions (Addendum)
It appears that you still have a small amount of pneumonia remaining in your lung and have been started on a new course of antibiotics.  Take the entire course of this antibiotic as prescribed.  Continue take all of your other home medications including your albuterol inhaler and your DuoNeb treatments to help keep your lungs open and clear.  Get rechecked immediately for any worsening shortness of breath.

## 2020-07-06 NOTE — ED Provider Notes (Signed)
[facility] EMERGENCY DEPARTMENT Provider Note   CSN: [id number] Arrival date & time: 07/06/20  1317     History Chief Complaint  Patient presents with  . Chest Pain    Gregory Mitchell is a 79 y.o. male with a history of COPD, CHF, hypertension, also history of pulmonary embolism, type 2 diabetes and renal insufficiency presenting for evaluation of worsening shortness of breath over the past several days.  He describes left-sided chest pressure in association with shortness of breath worse with exertion and better at rest.  He always sleeps propped on several pillows, denies any worsening of his orthopnea, denies peripheral edema.  He has been compliant with his medications.  He does have a cough which is productive of a yellow sputum and states he was admitted at [facility] about 3 weeks ago with pneumonia and completed a course of doxycycline and prednisone upon discharge from that admission.  He denies fevers or chills, no nausea, vomiting, no abdominal pain.  He does endorse worsening acid reflux, stating that he frequently tastes acid in his mouth.   He has found no alleviators for symptoms other than rest.  HPI     Past Medical History:  Diagnosis Date  . Asthma   . Chronic renal insufficiency   . COPD (chronic obstructive pulmonary disease) ([place])   . Heart failure (HCC)    Acute on chronic combined systolic and diastolic heart failure  . Hypertension   . Pulmonary emboli ([place]) 03/2019  . Renal insufficiency   . Type 2 diabetes mellitus [facility])     Patient Active Problem List   Diagnosis Date Noted  . Acid reflux 06/12/2020  . Candidal esophagitis ([place]) 05/21/2020  . Esophageal dysphagia 02/20/2020  . Abdominal pain, epigastric 02/20/2020  . Abnormal CT of liver 02/20/2020  . AKI (acute kidney injury) ([place]) 09/04/2019  . Acute on chronic systolic CHF (congestive heart failure) ([place]) 09/04/2019  . Trifascicular block 09/04/2019  . Acute on chronic respiratory failure with  hypoxia ([place]) 08/31/2019  . Acute on chronic diastolic CHF (congestive heart failure) ([place]) 08/31/2019  . COPD (chronic obstructive pulmonary disease) ([place]) 12/21/2013  . Bronchiectasis ([place]) 12/21/2013  . Dyspnea on exertion 08/23/2012  . HTN (hypertension) 08/23/2012  . Fever 02/11/2012  . Obesity 01/11/2012  . Chest pain 11/29/2011  . Cardiomyopathy, secondary ([place]) 11/29/2011  . Chronic renal insufficiency   . Type 2 diabetes mellitus ([place])     Past Surgical History:  Procedure Laterality Date  . APPENDECTOMY    . BALLOON DILATION N/A 03/18/2020   Procedure: BALLOON DILATION;  Surgeon: Eloise Harman, DO;  Location: [facility] ENDO SUITE;  Service: Endoscopy;  Laterality: N/A;  . CHOLECYSTECTOMY    . COLONOSCOPY  10/2006   Dr.Anwar:normal  . ESOPHAGOGASTRODUODENOSCOPY (EGD) WITH PROPOFOL N/A 03/18/2020   Procedure: ESOPHAGOGASTRODUODENOSCOPY (EGD) WITH PROPOFOL;  Surgeon: Eloise Harman, DO;  Location: [facility] ENDO SUITE;  Service: Endoscopy;  Laterality: N/A;  2:30pm  . NASAL SINUS SURGERY    . NEPHRECTOMY     RIGHT (Question CA.  No further therapy needed.)  . RIGHT/LEFT HEART CATH AND CORONARY ANGIOGRAPHY N/A 09/03/2019   Procedure: RIGHT/LEFT HEART CATH AND CORONARY ANGIOGRAPHY;  Surgeon: Lorretta Harp, MD;  Location: [facility] CV LAB;  Service: Cardiovascular;  Laterality: N/A;       Family History  Problem Relation Age of Onset  . Lung cancer Brother   . Stroke Mother   . Breast cancer Mother   . Colon cancer  Neg Hx     Social History   Tobacco Use  . Smoking status: Former Smoker    Packs/day: 1.00    Years: 15.00    Pack years: 15.00    Types: Cigarettes    Start date: 07/14/1957    Quit date: 08/09/1976    Years since quitting: 43.9  . Smokeless tobacco: Former Systems developer    Types: Chew    Quit date: 02/10/2006  . Tobacco comment: chewed tobacco for 10 years  Vaping Use  . Vaping Use: Never used  Substance Use Topics  . Alcohol use: Not Currently     Alcohol/week: 0.0 standard drinks    Comment: no etoh since he was in his 70s  . Drug use: Never    Home Medications Prior to Admission medications   Medication Sig Start Date End Date Taking? Authorizing Provider  albuterol (PROAIR HFA) 108 (90 BASE) MCG/ACT inhaler Inhale 2 puffs into the lungs every 6 (six) hours as needed for wheezing or shortness of breath.     [provider]  amoxicillin-clavulanate (AUGMENTIN) 875-125 MG tablet Take 1 tablet by mouth every 12 (twelve) hours. 07/06/20   Evalee Jefferson, PA-C  apixaban (ELIQUIS) 5 MG TABS tablet Take 5 mg by mouth 2 (two) times daily.    [provider]  carvedilol (COREG) 12.5 MG tablet Take 12.5 mg by mouth 2 (two) times daily. 03/24/20   [provider]  furosemide (LASIX) 20 MG tablet TAKE ONE (1) TABLET (20 MG TOTAL) BY MOUTH DAILY AS NEEDED FOR FLUID OR EDEMA (TAKE IN CASE OF LEG SWELLING OR WEIGHT GAIN THREE (3) LBS IN 24 HOURS OR FIVE (5) LBS IN 7 DAYS.). 06/23/20   Arnoldo Lenis, MD  hydrALAZINE (APRESOLINE) 10 MG tablet TAKE ONE (1) TABLET (10 MG TOTAL) BY MOUTH EVERY 8 (EIGHT) HOURS. 01/17/20   Verta Ellen., NP  HYDROcodone-acetaminophen (NORCO) 7.5-325 MG tablet Take 1 tablet by mouth 2 (two) times daily as needed for pain. 02/13/20   [provider]  ipratropium-albuterol (DUONEB) 0.5-2.5 (3) MG/3ML SOLN Take 3 mLs by nebulization every 6 (six) hours as needed (for shortness of breath/wheezing).     [provider]  losartan (COZAAR) 50 MG tablet Take 25 mg by mouth daily.  01/04/20   [provider]  metFORMIN (GLUCOPHAGE-XR) 500 MG 24 hr tablet Take 1,000 mg by mouth 2 (two) times daily.    [provider]  Omega-3 Fatty Acids (FISH OIL) 1000 MG CAPS Take 1,000 mg by mouth daily.     [provider]  OXYGEN Inhale 2 L into the lungs at bedtime as needed (for shortness of breath).    [provider]  pantoprazole (PROTONIX) 40 MG tablet Take 1  tablet (40 mg total) by mouth 2 (two) times daily. 05/21/20 11/17/20  Eloise Harman, DO  SYMBICORT 160-4.5 MCG/ACT inhaler Inhale 2 puffs into the lungs in the morning and at bedtime. 02/03/20   [provider]  tamsulosin (FLOMAX) 0.4 MG CAPS capsule Take 0.4 mg by mouth daily.  11/02/17   [provider]    Allergies    Amoxicillin and Ciprofloxacin  Review of Systems   Review of Systems  Constitutional: Negative for chills and fever.  HENT: Negative for congestion and sore throat.   Eyes: Negative.   Respiratory: Positive for cough, chest tightness, shortness of breath and wheezing.   Cardiovascular: Negative for chest pain, palpitations and leg swelling.  Gastrointestinal: Negative for abdominal  pain, nausea and vomiting.  Genitourinary: Negative.   Musculoskeletal: Negative for arthralgias, joint swelling and neck pain.  Skin: Negative.  Negative for rash and wound.  Neurological: Negative for dizziness, weakness, light-headedness, numbness and headaches.  Psychiatric/Behavioral: Negative.     Physical Exam Updated Vital Signs BP 125/76   Pulse 84   Temp 98.5 F (36.9 C) (Oral)   Resp (!) 23   Ht 5\' 6"  (1.676 m)   Wt 65.8 kg   SpO2 97%   BMI 23.40 kg/m   Physical Exam Vitals and nursing note reviewed.  Constitutional:      Appearance: He is well-developed.  HENT:     Head: Normocephalic and atraumatic.  Eyes:     Conjunctiva/sclera: Conjunctivae normal.  Cardiovascular:     Rate and Rhythm: Normal rate and regular rhythm.     Heart sounds: Normal heart sounds.  Pulmonary:     Effort: Pulmonary effort is normal.     Breath sounds: Decreased breath sounds present. No wheezing.     Comments: Distant breath sounds,  Expiratory wheeze left.  Few rales left base. Abdominal:     General: Bowel sounds are normal.     Palpations: Abdomen is soft.     Tenderness: There is no abdominal tenderness.  Musculoskeletal:        General: Normal range of  motion.     Cervical back: Normal range of motion.     Right lower leg: No edema.     Left lower leg: No edema.  Skin:    General: Skin is warm and dry.  Neurological:     Mental Status: He is alert.     ED Results / Procedures / Treatments   Labs (all labs ordered are listed, but only abnormal results are displayed) Labs Reviewed  BASIC METABOLIC PANEL - Abnormal; Notable for the following components:      Result Value   Glucose, Bld 145 (*)    Creatinine, Ser 1.31 (*)    Calcium 8.7 (*)    GFR, Estimated 55 (*)    All other components within normal limits  CBC - Abnormal; Notable for the following components:   RBC 3.59 (*)    Hemoglobin 9.7 (*)    HCT 33.5 (*)    MCHC 29.0 (*)    RDW 15.6 (*)    All other components within normal limits  HEPATIC FUNCTION PANEL - Abnormal; Notable for the following components:   Albumin 3.4 (*)    Indirect Bilirubin 0.2 (*)    All other components within normal limits  BRAIN NATRIURETIC PEPTIDE - Abnormal; Notable for the following components:   B Natriuretic Peptide 1,124.0 (*)    All other components within normal limits  TROPONIN I (HIGH SENSITIVITY) - Abnormal; Notable for the following components:   Troponin I (High Sensitivity) 41 (*)    All other components within normal limits  TROPONIN I (HIGH SENSITIVITY) - Abnormal; Notable for the following components:   Troponin I (High Sensitivity) 37 (*)    All other components within normal limits  RESP PANEL BY RT-PCR (FLU A&B, COVID) ARPGX2  POC OCCULT BLOOD, ED    EKG EKG Interpretation  Date/Time:  Sunday July 06 2020 13:22:41 EST Ventricular Rate:  94 PR Interval:  174 QRS Duration: 142 QT Interval:  410 QTC Calculation: 512 R Axis:   -75 Text Interpretation: Normal sinus rhythm Left axis deviation Right bundle branch block Minimal voltage criteria for LVH, may be normal variant (  R in aVL ) T wave abnormality, consider lateral ischemia Abnormal ECG Since last tracing  rate faster Confirmed by Noemi Chapel [phone]) on 07/06/2020 6:16:48 PM   Radiology DG Chest 2 View  Result Date: 07/06/2020 CLINICAL DATA:  Chest pain EXAM: CHEST - 2 VIEW COMPARISON:  June 26, 2020 FINDINGS: Lungs are mildly hyperexpanded. There is atelectatic change in the left base. There is no edema or airspace opacity. Heart is upper normal in size with pulmonary vascularity normal. No adenopathy. No bone lesions. IMPRESSION: Lungs mildly hyperexpanded. Question a degree of underlying emphysematous change. Mild atelectasis left base. No edema or airspace opacity. Heart upper normal in size. Electronically Signed   By: Lowella Grip III M.D.   On: 07/06/2020 14:30   CT Angio Chest PE W and/or Wo Contrast  Result Date: 07/06/2020 CLINICAL DATA:  Chest pain x2 days. EXAM: CT ANGIOGRAPHY CHEST WITH CONTRAST TECHNIQUE: Multidetector CT imaging of the chest was performed using the standard protocol during bolus administration of intravenous contrast. Multiplanar CT image reconstructions and MIPs were obtained to evaluate the vascular anatomy. CONTRAST:  51mL OMNIPAQUE IOHEXOL 350 MG/ML SOLN COMPARISON:  June 21, 2020 FINDINGS: Cardiovascular: Satisfactory opacification of the pulmonary arteries to the segmental level. No evidence of pulmonary embolism. Normal heart size. No pericardial effusion. Mediastinum/Nodes: No enlarged mediastinal, hilar, or axillary lymph nodes. Thyroid gland, trachea, and esophagus demonstrate no significant findings. Lungs/Pleura: Mild, stable bibasilar atelectasis and/or infiltrate is seen. A small, stable area of scarring and/or atelectasis is noted within the anterior aspect of the right upper lobe. There is no evidence of a pleural effusion or pneumothorax. Upper Abdomen: Multiple surgical clips are seen within the right renal fossa. Musculoskeletal: Degenerative changes seen throughout the thoracic spine. Review of the MIP images confirms the above findings.  IMPRESSION: 1. No evidence of pulmonary embolism. 2. Mild, stable bibasilar atelectasis and/or infiltrate. Electronically Signed   By: Virgina Norfolk M.D.   On: 07/06/2020 18:20    Procedures Procedures (including critical care time)  Medications Ordered in ED Medications  albuterol (VENTOLIN HFA) 108 (90 Base) MCG/ACT inhaler 2 puff (2 puffs Inhalation Given 07/06/20 1524)  famotidine (PEPCID) tablet 20 mg (20 mg Oral Given 07/06/20 1744)  furosemide (LASIX) injection 40 mg (40 mg Intravenous Given 07/06/20 1743)  iohexol (OMNIPAQUE) 350 MG/ML injection 75 mL (75 mLs Intravenous Contrast Given 07/06/20 1808)  amoxicillin-clavulanate (AUGMENTIN) 875-125 MG per tablet 1 tablet (1 tablet Oral Given 07/06/20 1850)    ED Course  I have reviewed the triage vital signs and the nursing notes.  Pertinent labs & imaging results that were available during my care of the patient were reviewed by me and considered in my medical decision making (see chart for details).    MDM Rules/Calculators/A&P                          7:04 PM Labs and imaging reviewed and discussed with pt.  His wife gave him a sandwich when she arrived because he was hungry, now has complaint of worsened burning reflux. pepcid PO ordered.  He does have an elevated BNP suggesting CHF exacerbation.  He states he takes his lasix 20 mg daily but does not keep track of his weights. Ordered lasix 40 mg IV for diuresis.    Noted that pt's pulse has increased , now 115 to 118 during re-eval. CT angio ordered to rule out PE.  7:04 PM Imaging and labs reviewed, pt  with no PE, also no sig  Pulmonary edema, although pt reports feeling improved with his breathing after receiving IV lasix.  He does have residual pneumonia so started on Augmentin.  Chart states allergic to amoxil, pt denies, has had without SE. Also advised to continue other home meds including duonebs.  No PE.  His delta troponins are stable, review of chart, clean  cardiac cath 12 months ago.  This is not unstable angina.   Strict return precautions given. Pt o2 saturations 94-95% on room air.  No respiratory distress at time of dc.         Final Clinical Impression(s) / ED Diagnoses Final diagnoses:  Community acquired pneumonia, unspecified laterality  Chronic obstructive pulmonary disease with acute exacerbation ([place])    Rx / DC Orders ED Discharge Orders         Ordered    amoxicillin-clavulanate (AUGMENTIN) 875-125 MG tablet  Every 12 hours        07/06/20 1848           Evalee Jefferson, Hershal Coria 07/06/20 Joesph Fillers, MD 07/07/20 1610

## 2020-07-06 NOTE — ED Triage Notes (Signed)
Pt to er, pt states that he has been having some chest pain off and on for the past few days, states that his pain was worse last night, states that his pain is worse with exertion, states that the pain is better with rest, states that he also short of breath, states that his breathing is also better with rest.  States that he uses oxygen at night.

## 2020-07-06 NOTE — ED Provider Notes (Signed)
Patient is a 79 year old male, history of COPD, on oxygen at night.  Comes into the hospital with shortness of breath.  He has no edema, no JVD, soft abdomen, clear heart sounds, pulse of around 85.  He has normal pulses at the radial arteries, no peripheral edema, his lung sounds are diminished bilaterally without wheezing or rales.  He is able to speak in full sentences.  He has improved after medications given, he is currently on 20 mg of Lasix a day, his CT scan has been performed to rule out other causes of shortness of breath including pulmonary embolism, I have seen the CT scan and there is some inflammatory changes in the lower left lobe, this could be recurrent pneumonia or scarring as he was recently seen after a fall at an outside hospital and treated for pneumonia.  He is still coughing occasionally.  Oxygen is currently 99% on room air, anticipate discharge, CT scan read pending.  Medical screening examination/treatment/procedure(s) were conducted as a shared visit with non-physician practitioner(s) and myself.  I personally evaluated the patient during the encounter.  Clinical Impression:   Final diagnoses:  Community acquired pneumonia, unspecified laterality  Chronic obstructive pulmonary disease with acute exacerbation ([place])         Noemi Chapel, MD 07/07/20 1610

## 2020-07-10 ENCOUNTER — Ambulatory Visit: Payer: Medicare HMO | Admitting: Cardiology

## 2020-07-11 ENCOUNTER — Telehealth: Payer: Self-pay | Admitting: Internal Medicine

## 2020-07-11 NOTE — Telephone Encounter (Signed)
[phone] patient called and said that the medication for reflux is not working and hje needs a different medication called into [facility].  Said that acid is coming up

## 2020-07-14 NOTE — Telephone Encounter (Signed)
Good morning Dr. Abbey Chatters Pt called Friday evening and I returned his call this morning. He states he is coughing up yellow phlegm. States he has a cough as well for past 10 to 15 days. He also has a acid taste in his mouth at all times. Pt wants Rx for reflux sent to [facility].

## 2020-07-15 ENCOUNTER — Telehealth: Payer: Self-pay | Admitting: Internal Medicine

## 2020-07-15 MED ORDER — OMEPRAZOLE 40 MG PO CPDR: 40.0000 mg | DELAYED_RELEASE_CAPSULE | Freq: Two times a day (BID) | ORAL | 5 refills | Status: DC

## 2020-07-15 NOTE — Telephone Encounter (Signed)
Phoned the pt and advised to stop taking pantoprazole and start new Rx Omeprazole that was sent to his pharmacy. Pt agreed

## 2020-07-15 NOTE — Telephone Encounter (Signed)
Will change his PPI to Omeprazole and send to pharmacy. Please tell patient to stop taking pantoprazole.

## 2020-07-16 ENCOUNTER — Encounter (HOSPITAL_COMMUNITY): Payer: Medicare HMO | Admitting: Cardiology

## 2020-07-31 ENCOUNTER — Ambulatory Visit: Payer: Medicare HMO | Admitting: Cardiology

## 2020-08-13 ENCOUNTER — Ambulatory Visit (HOSPITAL_COMMUNITY)
Admission: RE | Admit: 2020-08-13 | Discharge: 2020-08-13 | Disposition: A | Discharge status: Home / Self Care | Payer: Medicare HMO | Source: Ambulatory Visit | Attending: Cardiology | Admitting: Cardiology

## 2020-08-13 ENCOUNTER — Other Ambulatory Visit: Payer: Self-pay

## 2020-08-13 VITALS — BP 131/78 | HR 93 | Wt 143.8 lb

## 2020-08-13 DIAGNOSIS — Z803 Family history of malignant neoplasm of breast: Secondary | ICD-10-CM | POA: Insufficient documentation

## 2020-08-13 DIAGNOSIS — J439 Emphysema, unspecified: Secondary | ICD-10-CM | POA: Diagnosis not present

## 2020-08-13 DIAGNOSIS — Z823 Family history of stroke: Secondary | ICD-10-CM | POA: Diagnosis not present

## 2020-08-13 DIAGNOSIS — I5032 Chronic diastolic (congestive) heart failure: Secondary | ICD-10-CM

## 2020-08-13 DIAGNOSIS — Z87891 Personal history of nicotine dependence: Secondary | ICD-10-CM | POA: Insufficient documentation

## 2020-08-13 DIAGNOSIS — Z8616 Personal history of COVID-19: Secondary | ICD-10-CM | POA: Insufficient documentation

## 2020-08-13 DIAGNOSIS — E1122 Type 2 diabetes mellitus with diabetic chronic kidney disease: Secondary | ICD-10-CM | POA: Diagnosis not present

## 2020-08-13 DIAGNOSIS — K219 Gastro-esophageal reflux disease without esophagitis: Secondary | ICD-10-CM | POA: Insufficient documentation

## 2020-08-13 DIAGNOSIS — Z7984 Long term (current) use of oral hypoglycemic drugs: Secondary | ICD-10-CM | POA: Insufficient documentation

## 2020-08-13 DIAGNOSIS — Z801 Family history of malignant neoplasm of trachea, bronchus and lung: Secondary | ICD-10-CM | POA: Insufficient documentation

## 2020-08-13 DIAGNOSIS — Z86711 Personal history of pulmonary embolism: Secondary | ICD-10-CM | POA: Diagnosis not present

## 2020-08-13 DIAGNOSIS — Z79899 Other long term (current) drug therapy: Secondary | ICD-10-CM | POA: Diagnosis not present

## 2020-08-13 DIAGNOSIS — I130 Hypertensive heart and chronic kidney disease with heart failure and stage 1 through stage 4 chronic kidney disease, or unspecified chronic kidney disease: Secondary | ICD-10-CM | POA: Diagnosis present

## 2020-08-13 DIAGNOSIS — I428 Other cardiomyopathies: Secondary | ICD-10-CM | POA: Insufficient documentation

## 2020-08-13 DIAGNOSIS — I4510 Unspecified right bundle-branch block: Secondary | ICD-10-CM | POA: Diagnosis not present

## 2020-08-13 DIAGNOSIS — Z9981 Dependence on supplemental oxygen: Secondary | ICD-10-CM | POA: Diagnosis not present

## 2020-08-13 DIAGNOSIS — I5022 Chronic systolic (congestive) heart failure: Secondary | ICD-10-CM | POA: Insufficient documentation

## 2020-08-13 DIAGNOSIS — Z7951 Long term (current) use of inhaled steroids: Secondary | ICD-10-CM | POA: Diagnosis not present

## 2020-08-13 DIAGNOSIS — N183 Chronic kidney disease, stage 3 unspecified: Secondary | ICD-10-CM | POA: Diagnosis not present

## 2020-08-13 DIAGNOSIS — Z7901 Long term (current) use of anticoagulants: Secondary | ICD-10-CM | POA: Diagnosis not present

## 2020-08-13 LAB — BASIC METABOLIC PANEL
Anion gap: 8 (ref 5–15)
BUN: 11 mg/dL (ref 8–23)
CO2: 26 mmol/L (ref 22–32)
Calcium: 8.7 mg/dL — ABNORMAL LOW (ref 8.9–10.3)
Chloride: 105 mmol/L (ref 98–111)
Creatinine, Ser: 1.26 mg/dL — ABNORMAL HIGH (ref 0.61–1.24)
GFR, Estimated: 58 mL/min — ABNORMAL LOW (ref >60)
Glucose, Bld: 82 mg/dL (ref 70–99)
Potassium: 4.2 mmol/L (ref 3.5–5.1)
Sodium: 139 mmol/L (ref 135–145)

## 2020-08-13 LAB — BRAIN NATRIURETIC PEPTIDE: B Natriuretic Peptide: 818.7 pg/mL — ABNORMAL HIGH (ref 0.0–100.0)

## 2020-08-13 MED ORDER — ENTRESTO 24-26 MG PO TABS: 1.0000 | ORAL_TABLET | Freq: Two times a day (BID) | ORAL | 11 refills | Status: DC

## 2020-08-13 MED ORDER — DAPAGLIFLOZIN PROPANEDIOL 10 MG PO TABS: 10.0000 mg | ORAL_TABLET | Freq: Every day | ORAL | 11 refills | Status: DC

## 2020-08-13 NOTE — Progress Notes (Signed)
PCP: Toma Deiters, MD Cardiology: Dr Wyline Mood HF Cardiology: Dr. Shirlee Latch  80 y.o. with history of COPD, CKD stage 3, HTN, and chronic systolic CHF was referred by Dr. Wyline Mood for evaluation of CHF.  Patient was first found to have CHF in 1/21.  He was admitted at that time with volume overload and dyspnea.  Cath showed no significant coronary disease.  Echo showed EF 25-30%.  Most recent prior to 2017 showed normal EF.  He was admitted in [place] in 8/21 with CHF and PNA.  He was admitted at [facility] in 12/21 with COVID-19 and CHF. He also has emphysema and uses home oxygen at night. He no longer smokes.  He had a PE earlier this year and is on apixaban.   Patient lives at home with his family. He is short of breath after walking 50-100 feet or walking up stairs. No orthopnea/PND.  Rare atypical chest pain. No lightheadedness.  He frequently coughs.   ECG (personally reviewed): NSR, RBBB, LAFB  Labs (12/21): K 4.7, creatinine 1.4  PMH: 1. HTN 2. Hyperlipidemia 3. PE: 2021 4. COPD: Emphysema.  He is on oxygen at night.  Quit tobacco 40+ years ago.  5. Type 2 diabetes 6. CKD stage 3 7. GERD with esophagitis 8. COVID-19 infection 9. Chronic systolic CHF: Nonischemic cardiomyopathy.  - Echo (2017): EF 55-60% - LHC (1/21): Normal coronaries.  - Echo (1/21): EF 25-30%, mild MR, normal RV  SH: Lives in [place].  Retired.  Former smoker.  Married.    Family History  Problem Relation Age of Onset  . Lung cancer Brother   . Stroke Mother   . Breast cancer Mother   . Colon cancer Neg Hx    ROS: All systems reviewed and negative except as per HPI.   Current Outpatient Medications  Medication Sig Dispense Refill  . albuterol (VENTOLIN HFA) 108 (90 Base) MCG/ACT inhaler Inhale 2 puffs into the lungs every 6 (six) hours as needed for wheezing or shortness of breath.     [name] amoxicillin-clavulanate (AUGMENTIN) 875-125 MG tablet Take 1 tablet by mouth every 12 (twelve) hours. 14  tablet 0  . apixaban (ELIQUIS) 5 MG TABS tablet Take 5 mg by mouth 2 (two) times daily.    . carvedilol (COREG) 12.5 MG tablet Take 12.5 mg by mouth 2 (two) times daily.    . dapagliflozin propanediol (FARXIGA) 10 MG TABS tablet Take 1 tablet (10 mg total) by mouth daily before breakfast. 30 tablet 11  . furosemide (LASIX) 20 MG tablet Take 20 mg by mouth 2 (two) times daily.    [name] ipratropium-albuterol (DUONEB) 0.5-2.5 (3) MG/3ML SOLN Take 3 mLs by nebulization every 6 (six) hours as needed (for shortness of breath/wheezing).     . metFORMIN (GLUCOPHAGE-XR) 500 MG 24 hr tablet Take 1,000 mg by mouth 2 (two) times daily.    [name] omeprazole (PRILOSEC) 40 MG capsule Take 1 capsule (40 mg total) by mouth 2 (two) times daily before a meal. 30 minutes before breakfast and 30 minutes before dinner 60 capsule 5  . OXYGEN Inhale 2 L into the lungs at bedtime as needed (for shortness of breath).    . sacubitril-valsartan (ENTRESTO) 24-26 MG Take 1 tablet by mouth 2 (two) times daily. 60 tablet 11  . SYMBICORT 160-4.5 MCG/ACT inhaler Inhale 2 puffs into the lungs in the morning and at bedtime.    . tamsulosin (FLOMAX) 0.4 MG CAPS capsule Take 0.4 mg by mouth daily.  0   No current facility-administered medications for this encounter.   BP 131/78   Pulse 93   Wt 65.2 kg (143 lb 12.8 oz)   SpO2 94%   BMI 23.21 kg/m  General: NAD Neck: JVP 7-8 cn, no thyromegaly or thyroid nodule.  Lungs: Crackles/rhonchi in lungs bilaterally.  CV: Nondisplaced PMI.  Heart regular S1/S2, no S3/S4, no murmur.  Trace ankle edema.  No carotid bruit.  Normal pedal pulses.  Abdomen: Soft, nontender, no hepatosplenomegaly, no distention.  Skin: Intact without lesions or rashes.  Neurologic: Alert and oriented x 3.  Psych: Normal affect. Extremities: No clubbing or cyanosis.  HEENT: Normal.   Assessment/Plan: 1. Chronic systolic CHF: Nonischemic cardiomyopathy.  LHC in 1/21 with no significant coronary disease.  Echo  (1/21) with EF 25-30%, mild MR, normal RV. He has had several admission with CHF and COPD.  Cause of cardiomyopathy is uncertain.  NYHA class III symptoms.  He is not particularly volume overloaded on exam.  - Continue Coreg 12.5 mg bid. - Continue Lasix 20 mg bid, BMET today.  - Stop hydralazine and start Entresto 24/26 bid.  BMET 10 days.  - Start Farxiga 10 mg daily with CHF and CKD.  - Will titrate meds as creatinine and BP tolerate, will need repeat assessment of LV EF eventually => would favor cardiac MRI to look for infiltrative disease.  2. History of PE: He remains on Eliquis.  3. CKD: Stage 3.  Watch creatinine closely with medication titration.  4. COPD: On oxygen at night.  Lung exam consistent with chronic bronchitis.  Has pulmonary MD in [place].   Loralie Champagne 08/13/2020

## 2020-08-13 NOTE — Patient Instructions (Signed)
START Entresto 24/26 mg, one tab twice  Day START Farxiga 10 mg, one tab daily STOP Hydralazine  Labs today We will only contact you if something comes back abnormal or we need to make some changes. Otherwise no news is good news!  Labs needed in 7-10 days  Your physician recommends that you schedule a follow-up appointment in: 3-4 weeks with Dr Shirlee Latch  If you have any questions or concerns before your next appointment please send [place] a message through [place] or call our office at [phone].    TO LEAVE A MESSAGE FOR THE NURSE SELECT OPTION 2, PLEASE LEAVE A MESSAGE INCLUDING: . YOUR NAME . DATE OF BIRTH . CALL BACK NUMBER . REASON FOR CALL**this is important as we prioritize the call backs  YOU WILL RECEIVE A CALL BACK THE SAME DAY AS LONG AS YOU CALL BEFORE 4:00 PM   Do the following things EVERYDAY: 1) Weigh yourself in the morning before breakfast. Write it down and keep it in a log. 2) Take your medicines as prescribed 3) Eat low salt foods-Limit salt (sodium) to 2000 mg per day.  4) Stay as active as you can everyday 5) Limit all fluids for the day to less than 2 liters

## 2020-09-03 NOTE — Progress Notes (Signed)
PCP: Neale Burly, MD Cardiology: Dr Harl Bowie HF Cardiology: Dr. Aundra Dubin  80 y.o. with history of COPD, CKD stage 3, HTN, and chronic systolic CHF was referred by Dr. Harl Bowie for evaluation of CHF.  Patient was first found to have CHF in 1/21.  He was admitted at that time with volume overload and dyspnea.  Cath showed no significant coronary disease.  Echo showed EF 25-30%.  Most recent prior to 2017 showed normal EF.  He was admitted in [place] in 8/21 with CHF and PNA.  He was admitted at [facility] in 12/21 with COVID-19 and CHF. He also has emphysema and uses home oxygen at night. He no longer smokes.  He had a PE earlier this year and is on apixaban.   Today he returns for HF follow up with his daughter. Last visit farxiga started. Overall feeling fine. Mild SOB with exertion. Denies PND/Orthopnea. Appetite ok. No fever or chills. Weight at home 153 pounds. Taking all medications but he has not been taking coreg. He thought he was supposed to stop coreg.    Labs (12/21): K 4.7, creatinine 1.4 Labs (08/13/2020): K 4.2 Creatinine 1.2 Labs (09/01/20): K 4.6 Creatinine 1.4   PMH: 1. HTN 2. Hyperlipidemia 3. PE: 2021 4. COPD: Emphysema.  He is on oxygen at night.  Quit tobacco 40+ years ago.  5. Type 2 diabetes 6. CKD stage 3 7. GERD with esophagitis 8. COVID-19 infection 9. Chronic systolic CHF: Nonischemic cardiomyopathy.  - Echo (2017): EF 55-60% - LHC (1/21): Normal coronaries.  - Echo (1/21): EF 25-30%, mild MR, normal RV  SH: Lives in [place].  Retired.  Former smoker.  Married.    Family History  Problem Relation Age of Onset  . Lung cancer Brother   . Stroke Mother   . Breast cancer Mother   . Colon cancer Neg Hx    ROS: All systems reviewed and negative except as per HPI.   Current Outpatient Medications  Medication Sig Dispense Refill  . albuterol (VENTOLIN HFA) 108 (90 Base) MCG/ACT inhaler Inhale 2 puffs into the lungs every 6 (six) hours as needed for  wheezing or shortness of breath.     [name] apixaban (ELIQUIS) 5 MG TABS tablet Take 5 mg by mouth 2 (two) times daily.    . cefUROXime (CEFTIN) 500 MG tablet Take 500 mg by mouth 2 (two) times daily with a meal.    . dapagliflozin propanediol (FARXIGA) 10 MG TABS tablet Take 1 tablet (10 mg total) by mouth daily before breakfast. 30 tablet 11  . furosemide (LASIX) 20 MG tablet Take 20 mg by mouth 2 (two) times daily.    [name] ipratropium-albuterol (DUONEB) 0.5-2.5 (3) MG/3ML SOLN Take 3 mLs by nebulization every 6 (six) hours as needed (for shortness of breath/wheezing).     . metFORMIN (GLUCOPHAGE-XR) 500 MG 24 hr tablet Take 1,000 mg by mouth 2 (two) times daily.    [name] omeprazole (PRILOSEC) 40 MG capsule Take 1 capsule (40 mg total) by mouth 2 (two) times daily before a meal. 30 minutes before breakfast and 30 minutes before dinner 60 capsule 5  . OXYGEN Inhale 2 L into the lungs at bedtime as needed (for shortness of breath).    . pantoprazole (PROTONIX) 40 MG tablet Take 40 mg by mouth daily.    . sacubitril-valsartan (ENTRESTO) 24-26 MG Take 1 tablet by mouth 2 (two) times daily. 60 tablet 11  . SYMBICORT 160-4.5 MCG/ACT inhaler Inhale 2 puffs into the  lungs in the morning and at bedtime.     No current facility-administered medications for this encounter.   BP (!) 160/80   Pulse 95   Wt 64.9 kg   SpO2 95%   BMI 23.08 kg/m   Vitals:   09/04/20 1416  BP: (!) 160/80  Pulse: 95  SpO2: 95%    Reds Clip 34%.  General:  Well appearing. No resp difficulty HEENT: normal Neck: supple. JVP 5-6 . Carotids 2+ bilat; no bruits. No lymphadenopathy or thryomegaly appreciated. Cor: PMI nondisplaced. Regular rate & rhythm. No rubs, gallops or murmurs. Lungs: clear Abdomen: soft, nontender, nondistended. No hepatosplenomegaly. No bruits or masses. Good bowel sounds. Extremities: no cyanosis, clubbing, rash, Trace-1+ at his ankles Neuro: alert & orientedx3, cranial nerves grossly intact. moves all 4  extremities w/o difficulty. Affect pleasant  Assessment/Plan: 1. Chronic systolic CHF: Nonischemic cardiomyopathy.  LHC in 1/21 with no significant coronary disease.  Echo (1/21) with EF 25-30%, mild MR, normal RV. He has had several admission with CHF and COPD.  Cause of cardiomyopathy is uncertain.  -NYHA II. Reds Clip 34%.  - Volume status stable. Continue current dose of lasix.  - Restart coreg 12.5 mg bid. - Continue Entresto 24/26 bid.  - Continue Farxiga 10 mg daily with CHF and CKD.  - Next visit consider spironolactone.  - Will titrate meds as creatinine and BP tolerate, will need repeat assessment of LV EF eventually => would favor cardiac MRI to look for infiltrative disease.  2. History of PE: He remains on Eliquis.  3. CKD: Stage 3a - I reviewed creatinine from 1/24 1.4 , Stable.  4. COPD: On oxygen at night.   5. HTN: Elevated. Restarting coreg today.   Discussed medications today.   Follow up in 2 -3 weeks with pharmacy and 6 weeks with APP.   Cas Tracz NP-C  09/04/2020

## 2020-09-04 ENCOUNTER — Encounter (HOSPITAL_COMMUNITY): Payer: Self-pay

## 2020-09-04 ENCOUNTER — Other Ambulatory Visit: Payer: Self-pay

## 2020-09-04 ENCOUNTER — Ambulatory Visit (HOSPITAL_COMMUNITY)
Admission: RE | Admit: 2020-09-04 | Discharge: 2020-09-04 | Disposition: A | Discharge status: Home / Self Care | Payer: Medicare HMO | Source: Ambulatory Visit | Attending: Adult Health | Admitting: Adult Health

## 2020-09-04 ENCOUNTER — Telehealth (HOSPITAL_COMMUNITY): Payer: Self-pay | Admitting: *Deleted

## 2020-09-04 VITALS — BP 160/80 | HR 95 | Wt 143.0 lb

## 2020-09-04 DIAGNOSIS — J449 Chronic obstructive pulmonary disease, unspecified: Secondary | ICD-10-CM | POA: Diagnosis not present

## 2020-09-04 DIAGNOSIS — Z9981 Dependence on supplemental oxygen: Secondary | ICD-10-CM | POA: Diagnosis not present

## 2020-09-04 DIAGNOSIS — I5022 Chronic systolic (congestive) heart failure: Secondary | ICD-10-CM | POA: Diagnosis not present

## 2020-09-04 DIAGNOSIS — I130 Hypertensive heart and chronic kidney disease with heart failure and stage 1 through stage 4 chronic kidney disease, or unspecified chronic kidney disease: Secondary | ICD-10-CM | POA: Diagnosis not present

## 2020-09-04 DIAGNOSIS — Z86711 Personal history of pulmonary embolism: Secondary | ICD-10-CM | POA: Insufficient documentation

## 2020-09-04 DIAGNOSIS — I10 Essential (primary) hypertension: Secondary | ICD-10-CM

## 2020-09-04 DIAGNOSIS — Z79899 Other long term (current) drug therapy: Secondary | ICD-10-CM | POA: Insufficient documentation

## 2020-09-04 DIAGNOSIS — I428 Other cardiomyopathies: Secondary | ICD-10-CM | POA: Insufficient documentation

## 2020-09-04 DIAGNOSIS — Z7984 Long term (current) use of oral hypoglycemic drugs: Secondary | ICD-10-CM | POA: Insufficient documentation

## 2020-09-04 DIAGNOSIS — Z7901 Long term (current) use of anticoagulants: Secondary | ICD-10-CM | POA: Diagnosis not present

## 2020-09-04 DIAGNOSIS — Z87891 Personal history of nicotine dependence: Secondary | ICD-10-CM | POA: Insufficient documentation

## 2020-09-04 DIAGNOSIS — R0602 Shortness of breath: Secondary | ICD-10-CM

## 2020-09-04 DIAGNOSIS — N1831 Chronic kidney disease, stage 3a: Secondary | ICD-10-CM | POA: Insufficient documentation

## 2020-09-04 MED ORDER — CARVEDILOL 12.5 MG PO TABS: 12.5000 mg | ORAL_TABLET | Freq: Two times a day (BID) | ORAL | 11 refills | Status: DC

## 2020-09-04 NOTE — Patient Instructions (Addendum)
RESTART Coreg 12.5 mg ,one tab twice a day  Your physician recommends that you schedule a follow-up appointment in: 2-3 weeks with the pharmacy team  Your physician recommends that you schedule a follow-up appointment in: 6-8 weeks  in the Advanced Practitioners (PA/NP) Clinic    Do the following things EVERYDAY: 1) Weigh yourself in the morning before breakfast. Write it down and keep it in a log. 2) Take your medicines as prescribed 3) Eat low salt foods--Limit salt (sodium) to 2000 mg per day.  4) Stay as active as you can everyday 5) Limit all fluids for the day to less than 2 liters  At the [facility], you and your health needs are our priority. As part of our continuing mission to provide you with exceptional heart care, we have created designated Provider Care Teams. These Care Teams include your primary Cardiologist (physician) and Advanced Practice Providers (APPs- Physician Assistants and Nurse Practitioners) who all work together to provide you with the care you need, when you need it.   You may see any of the following providers on your designated Care Team at your next follow up: [name] Dr Glori Bickers . Dr Loralie Champagne . Darrick Grinder, NP . Lyda Jester, [place] . Audry Riles, PharmD   Please be sure to bring in all your medications bottles to every appointment.

## 2020-09-04 NOTE — Addendum Note (Signed)
Encounter addended by: Kerry Dory, CMA on: 09/04/2020 4:51 PM  Actions taken: Clinical Note Signed

## 2020-09-04 NOTE — Progress Notes (Signed)
ReDS Vest / Clip - 09/04/20 1600      ReDS Vest / Clip   Station Marker C    Ruler Value 30    ReDS Value Range Low volume    ReDS Actual Value 34    Anatomical Comments sitting

## 2020-09-04 NOTE — Telephone Encounter (Signed)
Received a VM asking if we received pts lab results. I checked with clinic staff and we have not received results.i called back to inform. No answer/no vm set up.

## 2020-09-08 ENCOUNTER — Encounter (HOSPITAL_COMMUNITY): Payer: Medicare HMO | Admitting: Cardiology

## 2020-09-17 ENCOUNTER — Other Ambulatory Visit (HOSPITAL_COMMUNITY): Payer: Self-pay | Admitting: Cardiology

## 2020-09-17 ENCOUNTER — Ambulatory Visit: Payer: Medicare HMO | Admitting: Internal Medicine

## 2020-09-17 ENCOUNTER — Other Ambulatory Visit: Payer: Self-pay

## 2020-09-17 ENCOUNTER — Encounter: Payer: Self-pay | Admitting: Internal Medicine

## 2020-09-17 VITALS — BP 136/73 | HR 85 | Temp 97.7°F | Ht 66.0 in | Wt 153.0 lb

## 2020-09-17 DIAGNOSIS — R1310 Dysphagia, unspecified: Secondary | ICD-10-CM

## 2020-09-17 DIAGNOSIS — B3781 Candidal esophagitis: Secondary | ICD-10-CM

## 2020-09-17 NOTE — Patient Instructions (Signed)
I am going to discuss your case with infectious disease expert in [place] prior to making any further decisions regarding treatment. I will be in touch after I speak with them.   At [facility] we value your feedback. You may receive a survey about your visit today. Please share your experience as we strive to create trusting relationships with our patients to provide genuine, compassionate, quality care.  We appreciate your understanding and patience as we review any laboratory studies, imaging, and other diagnostic tests that are ordered as we care for you. Our office policy is 5 business days for review of these results, and any emergent or urgent results are addressed in a timely manner for your best interest. If you do not hear from our office in 1 week, please contact us.   We also encourage the use of MyChart, which contains your medical information for your review as well. If you are not enrolled in this feature, an access code is on this after visit summary for your convenience. Thank you for allowing [place] to be involved in your care.  It was great to see you today!  I hope you have a great rest of your winter!!    Elon Alas. Abbey Chatters, D.O. Gastroenterology and Hepatology [facility]

## 2020-09-18 MED ORDER — FLUCONAZOLE 200 MG PO TABS: 200.0000 mg | ORAL_TABLET | Freq: Every day | ORAL | 0 refills | Status: AC

## 2020-09-18 NOTE — Progress Notes (Signed)
Referring Provider: Neale Burly, MD Primary Care Physician:  Neale Burly, MD Primary GI:  Dr. Abbey Chatters  Chief Complaint  Patient presents with  . Dysphagia    Food getting stuck in throat, chest pain after eating, constipation    HPI:   Gregory Mitchell is a 80 y.o. male who presents to the clinic today for follow-up visit. Patient came to [place] in August for epigastric pain, odynophagia. Subsequent EGD showed moderate to severe candidal esophagitis. I placed him on a course of Diflucan (400 mg day 1 and 200 mg daily for a total of 14 days) to which he showed mild improvement though was still having symptoms. Given the severity of his esophagitis I gave him a second 14-day course and patient's symptoms resolved.   Today, he states all of his symptoms have now returned. Notes odynophagia and chest pain again. He does use Symbicort regularly and does not wash his mouth out after use.   Past Medical History:  Diagnosis Date  . Asthma   . Chronic renal insufficiency   . COPD (chronic obstructive pulmonary disease) ([place])   . Heart failure (HCC)    Acute on chronic combined systolic and diastolic heart failure  . Hypertension   . Pulmonary emboli ([place]) 03/2019  . Renal insufficiency   . Type 2 diabetes mellitus ([place])     Past Surgical History:  Procedure Laterality Date  . APPENDECTOMY    . BALLOON DILATION N/A 03/18/2020   Procedure: BALLOON DILATION;  Surgeon: Eloise Harman, DO;  Location: [facility];  Service: Endoscopy;  Laterality: N/A;  . CHOLECYSTECTOMY    . COLONOSCOPY  10/2006   Dr.Anwar:normal  . ESOPHAGOGASTRODUODENOSCOPY (EGD) WITH PROPOFOL N/A 03/18/2020   Procedure: ESOPHAGOGASTRODUODENOSCOPY (EGD) WITH PROPOFOL;  Surgeon: Eloise Harman, DO;  Location: [facility];  Service: Endoscopy;  Laterality: N/A;  2:30pm  . NASAL SINUS SURGERY    . NEPHRECTOMY     RIGHT (Question CA.  No further therapy needed.)  . RIGHT/LEFT HEART CATH AND CORONARY  ANGIOGRAPHY N/A 09/03/2019   Procedure: RIGHT/LEFT HEART CATH AND CORONARY ANGIOGRAPHY;  Surgeon: Lorretta Harp, MD;  Location: [facility];  Service: Cardiovascular;  Laterality: N/A;    Current Outpatient Medications  Medication Sig Dispense Refill  . albuterol (VENTOLIN HFA) 108 (90 Base) MCG/ACT inhaler Inhale 2 puffs into the lungs every 6 (six) hours as needed for wheezing or shortness of breath.     [name] apixaban (ELIQUIS) 5 MG TABS tablet Take 5 mg by mouth 2 (two) times daily.    . carvedilol (COREG) 12.5 MG tablet Take 1 tablet (12.5 mg total) by mouth 2 (two) times daily. 60 tablet 11  . cefUROXime (CEFTIN) 500 MG tablet Take 500 mg by mouth 2 (two) times daily with a meal.    . dapagliflozin propanediol (FARXIGA) 10 MG TABS tablet Take 1 tablet (10 mg total) by mouth daily before breakfast. 30 tablet 11  . esomeprazole (NEXIUM) 20 MG capsule Take 20 mg by mouth daily at 12 noon.    . fluconazole (DIFLUCAN) 200 MG tablet Take 1 tablet (200 mg total) by mouth daily for 21 days. Take 2 tablets on day one then 1 tablet daily for a total of 21 days 22 tablet 0  . furosemide (LASIX) 20 MG tablet Take 20 mg by mouth 2 (two) times daily.    [name] ipratropium-albuterol (DUONEB) 0.5-2.5 (3) MG/3ML SOLN Take 3 mLs by nebulization every 6 (  six) hours as needed (for shortness of breath/wheezing).     . metFORMIN (GLUCOPHAGE-XR) 500 MG 24 hr tablet Take 1,000 mg by mouth 2 (two) times daily.    . OXYGEN Inhale 2 L into the lungs at bedtime as needed (for shortness of breath).    . sacubitril-valsartan (ENTRESTO) 24-26 MG Take 1 tablet by mouth 2 (two) times daily. 60 tablet 11  . SYMBICORT 160-4.5 MCG/ACT inhaler Inhale 2 puffs into the lungs in the morning and at bedtime.    [name] omeprazole (PRILOSEC) 40 MG capsule Take 1 capsule (40 mg total) by mouth 2 (two) times daily before a meal. 30 minutes before breakfast and 30 minutes before dinner (Patient not taking: Reported on 09/17/2020) 60 capsule 5   . pantoprazole (PROTONIX) 40 MG tablet Take 40 mg by mouth daily. (Patient not taking: Reported on 09/17/2020)     No current facility-administered medications for this visit.    Allergies as of 09/17/2020 - Review Complete 09/17/2020  Allergen Reaction Noted  . Amoxicillin  11/26/2011  . Ciprofloxacin  11/26/2011    Family History  Problem Relation Age of Onset  . Lung cancer Brother   . Stroke Mother   . Breast cancer Mother   . Colon cancer Neg Hx     Social History   Socioeconomic History  . Marital status: Married    Spouse name: Not on file  . Number of children: Not on file  . Years of education: Not on file  . Highest education level: Not on file  Occupational History  . Not on file  Tobacco Use  . Smoking status: Former Smoker    Packs/day: 1.00    Years: 15.00    Pack years: 15.00    Types: Cigarettes    Start date: 07/14/1957    Quit date: 08/09/1976    Years since quitting: 44.1  . Smokeless tobacco: Former Systems developer    Types: Chew    Quit date: 02/10/2006  . Tobacco comment: chewed tobacco for 10 years  Vaping Use  . Vaping Use: Never used  Substance and Sexual Activity  . Alcohol use: Not Currently    Alcohol/week: 0.0 standard drinks    Comment: no etoh since he was in his 73s  . Drug use: Never  . Sexual activity: Yes  Other Topics Concern  . Not on file  Social History Narrative   Lives with wife.  Three children.     Social Determinants of Health   Financial Resource Strain: Not on file  Food Insecurity: Not on file  Transportation Needs: Not on file  Physical Activity: Not on file  Stress: Not on file  Social Connections: Not on file    Subjective: Review of Systems  Constitutional: Negative for chills and fever.  HENT: Negative for congestion and hearing loss.   Eyes: Negative for blurred vision and double vision.  Respiratory: Negative for cough and shortness of breath.   Cardiovascular: Negative for chest pain and palpitations.   Gastrointestinal: Negative for abdominal pain, blood in stool, constipation, diarrhea, heartburn, melena and vomiting.       Chest pain, odynophagia  Genitourinary: Negative for dysuria and urgency.  Musculoskeletal: Negative for joint pain and myalgias.  Skin: Negative for itching and rash.  Neurological: Negative for dizziness and headaches.  Psychiatric/Behavioral: Negative for depression. The patient is not nervous/anxious.      Objective: BP 136/73   Pulse 85   Temp 97.7 F (36.5 C) (Temporal)   Ht  5\' 6"  (1.676 m)   Wt 153 lb (69.4 kg)   BMI 24.69 kg/m  Physical Exam Constitutional:      Appearance: Normal appearance.  HENT:     Head: Normocephalic and atraumatic.  Eyes:     Extraocular Movements: Extraocular movements intact.     Conjunctiva/sclera: Conjunctivae normal.  Cardiovascular:     Rate and Rhythm: Normal rate and regular rhythm.  Pulmonary:     Effort: Pulmonary effort is normal.     Breath sounds: Normal breath sounds.  Abdominal:     General: Bowel sounds are normal.     Palpations: Abdomen is soft.  Musculoskeletal:        General: Normal range of motion.     Cervical back: Normal range of motion and neck supple.  Skin:    General: Skin is warm.  Neurological:     General: No focal deficit present.     Mental Status: He is alert and oriented to person, place, and time.  Psychiatric:        Mood and Affect: Mood normal.        Behavior: Behavior normal.      Assessment: *Epigastric pain *Odynophagia *Candidal esophagitis  Plan: Discussed case with infectious disease who recommends retreating with Diflucan.  I will start patient on a 21-day course of fluconazole 400 mg day 1 and then 200 mg daily thereafter.  If this does not work, we may need to consider repeat EGD with samples and culture to rule out other species/resistance.  Counseled patient on the vast importance of washing his mouth out every time he uses the Symbicort and he  understands  He can follow-up with me in 3 months or sooner if needed.  09/18/2020 4:48 PM   Disclaimer: This note was dictated with voice recognition software. Similar sounding words can inadvertently be transcribed and may not be corrected upon review.

## 2020-09-28 NOTE — Progress Notes (Incomplete)
***  In Progress***  PCP: Neale Burly, MD Cardiology: Dr Harl Bowie HF Cardiology: Dr. Aundra Dubin  HPI:   80 y.o. with history of COPD, CKD stage 3, HTN, and chronic systolic CHF was referred by Dr. Harl Bowie for evaluation of CHF.  Patient was first found to have CHF in 08/2019.  He was admitted at that time with volume overload and dyspnea.  Cath showed no significant coronary disease.  Echo showed EF 25-30%.  Most recent prior to 2017 showed normal EF.  He was admitted in [place] in 03/2020 with CHF and PNA.  He was admitted at [facility] in 07/2020 with COVID-19 and CHF. He also has emphysema and uses home oxygen at night. He no longer smokes.  He had a PE earlier this year and is on apixaban.   He returned for HF follow up on 09/04/20 with his daughter. Overall he was feeling fine. Mild SOB with exertion. Denied PND/Orthopnea. Appetite was okay. No fever or chills. Weight at home was 153 pounds. He reported taking all medications but he had not been taking carvedilol. He thought he was supposed to stop carvedilol.  Today he returns to HF clinic for pharmacist medication titration. At last visit with NP, carvedilol 12.5 mg BID was restarted.   Overall feeling ***. Dizziness, lightheadedness, fatigue:  Chest pain or palpitations:  How is your breathing?: *** SOB: Able to complete all ADLs. Activity level ***  Weight at home pounds. Takes furosemide/torsemide/bumex *** mg *** daily.  LEE PND/Orthopnea  Appetite *** Low-salt diet:   Physical Exam Cost/affordability of meds   HF Medications: Carvedilol 12.5 mg BID Entresto 24-26 mg BID Farxiga 10 mg daily Furosemide 20 mg BID  Has the patient been experiencing any side effects to the medications prescribed?  {YES NO:22349}  Does the patient have any problems obtaining medications due to transportation or finances?   {YES NO:22349}  Understanding of regimen: {excellent/good/fair/poor:19665} Understanding of indications:  {excellent/good/fair/poor:19665} Potential of compliance: {excellent/good/fair/poor:19665} Patient understands to avoid NSAIDs. Patient understands to avoid decongestants.    Pertinent Lab Values (09/01/20) . Serum creatinine 1.38, BUN 25, Potassium 4.6, Sodium 145   Vital Signs: . Weight: *** (last clinic weight: ***) . Blood pressure: ***  . Heart rate: ***   Assessment/Plan: 1. Chronic systolic CHF: Nonischemic cardiomyopathy.  LHC in 08/2019 with no significant coronary disease.  Echo (08/2019) with EF 25-30%, mild MR, normal RV. He has had several admission with CHF and COPD.  Cause of cardiomyopathy is uncertain.  -NYHA Class II symptoms. Reds Clip 34%.  - Volume status stable.  - Continue furosemide 20 mg BID.  - Continue carvedilol 12.5 mg BID. - Continue Entresto 24/26 mg BID.  - Continue Farxiga 10 mg daily.   - Will titrate meds as creatinine and BP tolerate, will need repeat assessment of LV EF eventually => would favor cardiac MRI to look for infiltrative disease.   2. History of PE: He remains on Eliquis.   3. CKD: Stage 3a - Creatinine 1.38 from 09/01/20   4. COPD: On oxygen at night.    5. HTN: - Continue carvedilol and Eulogio Bear, PharmD, BCPS, BCCP, CPP Heart Failure Clinic Pharmacist [phone]

## 2020-09-29 ENCOUNTER — Inpatient Hospital Stay (HOSPITAL_COMMUNITY): Admission: RE | Admit: 2020-09-29 | Payer: Medicare HMO | Source: Ambulatory Visit

## 2020-10-01 ENCOUNTER — Ambulatory Visit: Payer: Medicare HMO | Admitting: Cardiology

## 2020-10-01 NOTE — Progress Notes (Deleted)
Clinical Summary Gregory Mitchell is a 80 y.o.male  1. Chronic systolic HF - LVEF previously as low as 35-40%, repeat echo showed improvement to 45-50% in 04/2013. He has had a prior cath 11/2011 that showed non-obstructive disease - 05/2016 echo LVEF 64-40%, grade I diastolic dysfunction  -Jan 2021 admission with volume overload - Jan 2021 echo LVEF 25-30%, grade II DDX, ,normal RV, mild MR,  - Jan 2021 cath: normal coronaries. CI 3, mean PA 17, PCWP 17, LVEDP 13 - medical therapy limited due to soft bp's. 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  - compliant with meds - some SOB up and down, some coughing and wheezing. Dr Koleen Nimrod started abx and steroid - no recent edema.   - follwoed in HF clinic. Has been started on entrseto, farxiga.  -    2. COPD - tobacco x 20 years, quit 40 years ago.  - followed by Dr Koleen Nimrod.  - recent exacerbation, has been on steroids and abx recently   4. HTN  - has not taken meds yet today - he is also currently on prednisone.    5. HL - muscle aches on statinprevoiusly -currently diet controlled   6. 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 admission -no prior history of blood clots. By historyappearsunprovoked. - he is on eliquis  Past Medical History:  Diagnosis Date  . Asthma   . Chronic renal insufficiency   . COPD (chronic obstructive pulmonary disease) ([place])   . Heart failure (H[facility])    Acute on chronic combined systolic and diastolic heart failure  . Hypertension   . Pulmonary emboli ([place]) 03/2019  . Renal insufficiency   . Type 2 diabetes mellitus (H[facility])      Allergies  Allergen Reactions  . Amoxicillin     Makes eyes turn yellow and burn  . Ciprofloxacin     Pt not sure     Current Outpatient Medications   Medication Sig Dispense Refill  . albuterol (VENTOLIN HFA) 108 (90 Base) MCG/ACT inhaler Inhale 2 puffs into the lungs every 6 (six) hours as needed for wheezing or shortness of breath.     [name] apixaban (ELIQUIS) 5 MG TABS tablet Take 5 mg by mouth 2 (two) times daily.    . carvedilol (COREG) 12.5 MG tablet Take 1 tablet (12.5 mg total) by mouth 2 (two) times daily. 60 tablet 11  . cefUROXime (CEFTIN) 500 MG tablet Take 500 mg by mouth 2 (two) times daily with a meal.    . dapagliflozin propanediol (FARXIGA) 10 MG TABS tablet Take 1 tablet (10 mg total) by mouth daily before breakfast. 30 tablet 11  . esomeprazole (NEXIUM) 20 MG capsule Take 20 mg by mouth daily at 12 noon.    . fluconazole (DIFLUCAN) 200 MG tablet Take 1 tablet (200 mg total) by mouth daily for 21 days. Take 2 tablets on day one then 1 tablet daily for a total of 21 days 22 tablet 0  . furosemide (LASIX) 20 MG tablet Take 20 mg by mouth 2 (two) times daily.    [name] ipratropium-albuterol (DUONEB) 0.5-2.5 (3) MG/3ML SOLN Take 3 mLs by nebulization every 6 (six) hours as needed (for shortness of breath/wheezing).     . metFORMIN (GLUCOPHAGE-XR) 500 MG 24 hr tablet Take 1,000 mg by mouth 2 (two) times daily.    [name]  omeprazole (PRILOSEC) 40 MG capsule Take 1 capsule (40 mg total) by mouth 2 (two) times daily before a meal. 30 minutes before breakfast and 30 minutes before dinner (Patient not taking: Reported on 09/17/2020) 60 capsule 5  . OXYGEN Inhale 2 L into the lungs at bedtime as needed (for shortness of breath).    . pantoprazole (PROTONIX) 40 MG tablet Take 40 mg by mouth daily. (Patient not taking: Reported on 09/17/2020)    . sacubitril-valsartan (ENTRESTO) 24-26 MG Take 1 tablet by mouth 2 (two) times daily. 60 tablet 11  . SYMBICORT 160-4.5 MCG/ACT inhaler Inhale 2 puffs into the lungs in the morning and at bedtime.     No current facility-administered medications for this visit.     Past Surgical History:  Procedure Laterality  Date  . APPENDECTOMY    . BALLOON DILATION N/A 03/18/2020   Procedure: BALLOON DILATION;  Surgeon: Eloise Harman, DO;  Location: [facility];  Service: Endoscopy;  Laterality: N/A;  . CHOLECYSTECTOMY    . COLONOSCOPY  10/2006   Dr.Anwar:normal  . ESOPHAGOGASTRODUODENOSCOPY (EGD) WITH PROPOFOL N/A 03/18/2020   Procedure: ESOPHAGOGASTRODUODENOSCOPY (EGD) WITH PROPOFOL;  Surgeon: Eloise Harman, DO;  Location: [facility];  Service: Endoscopy;  Laterality: N/A;  2:30pm  . NASAL SINUS SURGERY    . NEPHRECTOMY     RIGHT (Question CA.  No further therapy needed.)  . RIGHT/LEFT HEART CATH AND CORONARY ANGIOGRAPHY N/A 09/03/2019   Procedure: RIGHT/LEFT HEART CATH AND CORONARY ANGIOGRAPHY;  Surgeon: Lorretta Harp, MD;  Location: [facility];  Service: Cardiovascular;  Laterality: N/A;     Allergies  Allergen Reactions  . Amoxicillin     Makes eyes turn yellow and burn  . Ciprofloxacin     Pt not sure      Family History  Problem Relation Age of Onset  . Lung cancer Brother   . Stroke Mother   . Breast cancer Mother   . Colon cancer Neg Hx      Social History Gregory Mitchell reports that he quit smoking about 44 years ago. His smoking use included cigarettes. He started smoking about 63 years ago. He has a 15.00 pack-year smoking history. He quit smokeless tobacco use about 14 years ago.  His smokeless tobacco use included chew. Gregory Mitchell reports previous alcohol use.   Review of Systems CONSTITUTIONAL: No weight loss, fever, chills, weakness or fatigue.  HEENT: Eyes: No visual loss, blurred vision, double vision or yellow sclerae.No hearing loss, sneezing, congestion, runny nose or sore throat.  SKIN: No rash or itching.  CARDIOVASCULAR:  RESPIRATORY: No shortness of breath, cough or sputum.  GASTROINTESTINAL: No anorexia, nausea, vomiting or diarrhea. No abdominal pain or blood.  GENITOURINARY: No burning on urination, no polyuria NEUROLOGICAL: No headache,  dizziness, syncope, paralysis, ataxia, numbness or tingling in the extremities. No change in bowel or bladder control.  MUSCULOSKELETAL: No muscle, back pain, joint pain or stiffness.  LYMPHATICS: No enlarged nodes. No history of splenectomy.  PSYCHIATRIC: No history of depression or anxiety.  ENDOCRINOLOGIC: No reports of sweating, cold or heat intolerance. No polyuria or polydipsia.  [name]   Physical Examination There were no vitals filed for this visit. There were no vitals filed for this visit.  Gen: resting comfortably, no acute distress HEENT: no scleral icterus, pupils equal round and reactive, no palptable cervical adenopathy,  CV Resp: Clear to auscultation bilaterally GI: abdomen is soft, non-tender, non-distended, normal bowel sounds, no hepatosplenomegaly MSK: extremities  are warm, no edema.  Skin: warm, no rash Neuro:  no focal deficits Psych: appropriate affect   Diagnostic Studies  04/2013 echo Study Conclusions  - Left ventricle: The cavity size was mildly dilated. Wall thickness was increased in a pattern of mild to moderateLVH. Systolic function was mildly reduced. The estimated ejection fraction was in the range of 45% to 50%. There is mild global hypokinesis. There was an increased relative contribution of atrial contraction to ventricular filling. Doppler parameters are consistent with abnormal left ventricular relaxation (grade 1 diastolic dysfunction). Doppler parameters are consistent with high ventricular filling pressure. - Mitral valve: Calcified annulus. Mildly thickened leaflets . Trivial to mild regurgitation. - Left atrium: The atrium was mildly dilated. - Right ventricle: The cavity size was mildly dilated. Wall thickness was normal. Systolic function was mildly reduced. - Right atrium: The atrium was mildly dilated. - Pericardium, extracardiac: A trivial pericardial effusion was identified.   11/2011  Cath Procedural Findings:  Hemodynamics:  AO157/77 LV154/22  Coronary angiography:  Coronary dominance: Right  Left mainstem: Normal  Left anterior descending (LAD): Large vessel wrapping the apex. Mild lumina irregularities. D1 small normal. D2 small normal.  Left circumflex (LCx): AV group luminal irregularities. OM1 large and normal. PL x 2 small normal  Right coronary artery (RCA): Dominant. Long mid 25%. PDA normal. PL moderate and normal.   Left ventriculography: Left ventricle not injected  Final Conclusions: Mild coronary plaque. Non ischemic cardiomyopathy  Recommendations: Medical management.     12/2014 Exercise Cardiolite IMPRESSION: 1. Moderate size region of inferior wall myocardial scar. No ischemic territories seen. Overlying soft tissue attenuation artifact cannot entirely be ruled out.  2. Mild inferior wall hypokinesis.  3. Left ventricular ejection fraction 48%  4. Low to intermediate-risk stress test findings*.   05/2016 echo Study Conclusions  - Left ventricle: The cavity size was normal. Wall thickness was increased in a pattern of mild LVH. Systolic function was normal. The estimated ejection fraction was in the range of 55% to 60%. Wall motion was normal; there were no regional wall motion abnormalities. Doppler parameters are consistent with abnormal left ventricular relaxation (grade 1 diastolic dysfunction). - Aortic valve: Mildly calcified annulus. Trileaflet; mildly thickened leaflets. Valve area (VTI): 4.03 cm^2. Valve area (Vmax): 4.25 cm^2. Valve area (Vmean): 4.49 cm^2. - Left atrium: The atrium was mildly to moderately dilated. - Atrial septum: No defect or patent foramen ovale was identified. - Technically adequate study.  Jan 2021 echo  IMPRESSIONS    1. Left  ventricular ejection fraction, by visual estimation, is 25 to  30%. The left ventricle has severely decreased function. There is no left  ventricular hypertrophy.  2. Elevated left ventricular end-diastolic pressure.  3. Left ventricular diastolic parameters are consistent with Grade II  diastolic dysfunction (pseudonormalization).  4. Mild to moderately dilated left ventricular internal cavity size.  5. The left ventricle demonstrates global hypokinesis.  6. Global right ventricle has normal systolic function.The right  ventricular size is normal. Mildly increased right ventricular wall  thickness.  7. Left atrial size was severely dilated.  8. Right atrial size was moderately dilated.  9. The mitral valve is grossly normal. Mild mitral valve regurgitation.  10. The tricuspid valve is grossly normal.  11. The tricuspid valve is grossly normal. Tricuspid valve regurgitation  is mild.  12. The aortic valve is tricuspid. Aortic valve regurgitation is not  visualized. No evidence of aortic valve sclerosis or stenosis.  13. The pulmonic valve was grossly  normal. Pulmonic valve regurgitation is  not visualized.  14. The inferior vena cava is normal in size with greater than 50%  respiratory variability, suggesting right atrial pressure of 3 mmHg.    Jan 2021 cath IMPRESSION: Mr. Kracht has normal coronary arteries and low filling pressures suggesting that he has been adequately or over diuresed.  He did have a high V waves suggesting that he potentially has significant mitral regurgitation.  His systolic pressures were in the mid to high 90s and I therefore gave him a 250 cc bolus of saline given his low LVEDP and wedge.  He will need guideline directed optimal medical therapy for his LV dysfunction.  The sheath removed and a TR band was placed on the right wrist to achieve patent hemostasis.  The patient left lab in stable condition.  His renal function be carefully monitored.  Dr.  Davina Poke was notified of these results.   Assessment and Plan  1. Chronic systolic HF - new diagnosis during recent admission - medical therapy limited by renal dysfunction, also trifascicular block on EKG limit beta blocker dosing - euvolemic today. Has not taken meds today, unclear what his bp is when on meds. He will call with bp log on Monday, may tirate hydral/imdur pending results.  - repeat echo in next few months   2. HTN -elevated in clinic but has not taken meds yet - he will call Monday with a home bp log      Arnoldo Lenis, M.D., F.A.C.C.

## 2020-10-04 ENCOUNTER — Encounter (HOSPITAL_COMMUNITY): Payer: Self-pay | Admitting: *Deleted

## 2020-10-04 ENCOUNTER — Emergency Department (HOSPITAL_COMMUNITY): Payer: Medicare HMO

## 2020-10-04 ENCOUNTER — Other Ambulatory Visit: Payer: Self-pay

## 2020-10-04 ENCOUNTER — Inpatient Hospital Stay (HOSPITAL_COMMUNITY)
Admission: EM | Admit: 2020-10-04 | Discharge: 2020-10-06 | DRG: 177 | Disposition: A | Discharge status: Home Health | Payer: Medicare HMO | Attending: Internal Medicine | Admitting: Internal Medicine

## 2020-10-04 DIAGNOSIS — I4510 Unspecified right bundle-branch block: Secondary | ICD-10-CM | POA: Diagnosis present

## 2020-10-04 DIAGNOSIS — Z905 Acquired absence of kidney: Secondary | ICD-10-CM

## 2020-10-04 DIAGNOSIS — I429 Cardiomyopathy, unspecified: Secondary | ICD-10-CM | POA: Diagnosis not present

## 2020-10-04 DIAGNOSIS — J439 Emphysema, unspecified: Secondary | ICD-10-CM

## 2020-10-04 DIAGNOSIS — J1282 Pneumonia due to coronavirus disease 2019: Secondary | ICD-10-CM | POA: Diagnosis present

## 2020-10-04 DIAGNOSIS — U071 COVID-19: Principal | ICD-10-CM | POA: Diagnosis present

## 2020-10-04 DIAGNOSIS — J441 Chronic obstructive pulmonary disease with (acute) exacerbation: Secondary | ICD-10-CM

## 2020-10-04 DIAGNOSIS — Z79899 Other long term (current) drug therapy: Secondary | ICD-10-CM

## 2020-10-04 DIAGNOSIS — J440 Chronic obstructive pulmonary disease with acute lower respiratory infection: Secondary | ICD-10-CM | POA: Diagnosis present

## 2020-10-04 DIAGNOSIS — I5023 Acute on chronic systolic (congestive) heart failure: Secondary | ICD-10-CM

## 2020-10-04 DIAGNOSIS — B3781 Candidal esophagitis: Secondary | ICD-10-CM | POA: Diagnosis present

## 2020-10-04 DIAGNOSIS — Z7984 Long term (current) use of oral hypoglycemic drugs: Secondary | ICD-10-CM

## 2020-10-04 DIAGNOSIS — I5043 Acute on chronic combined systolic (congestive) and diastolic (congestive) heart failure: Secondary | ICD-10-CM | POA: Diagnosis present

## 2020-10-04 DIAGNOSIS — I509 Heart failure, unspecified: Secondary | ICD-10-CM

## 2020-10-04 DIAGNOSIS — J9621 Acute and chronic respiratory failure with hypoxia: Secondary | ICD-10-CM | POA: Diagnosis present

## 2020-10-04 DIAGNOSIS — Z86711 Personal history of pulmonary embolism: Secondary | ICD-10-CM

## 2020-10-04 DIAGNOSIS — J449 Chronic obstructive pulmonary disease, unspecified: Secondary | ICD-10-CM | POA: Diagnosis present

## 2020-10-04 DIAGNOSIS — I10 Essential (primary) hypertension: Secondary | ICD-10-CM

## 2020-10-04 DIAGNOSIS — E1122 Type 2 diabetes mellitus with diabetic chronic kidney disease: Secondary | ICD-10-CM

## 2020-10-04 DIAGNOSIS — R0600 Dyspnea, unspecified: Secondary | ICD-10-CM

## 2020-10-04 DIAGNOSIS — N189 Chronic kidney disease, unspecified: Secondary | ICD-10-CM | POA: Diagnosis present

## 2020-10-04 DIAGNOSIS — I130 Hypertensive heart and chronic kidney disease with heart failure and stage 1 through stage 4 chronic kidney disease, or unspecified chronic kidney disease: Secondary | ICD-10-CM | POA: Diagnosis present

## 2020-10-04 DIAGNOSIS — E119 Type 2 diabetes mellitus without complications: Secondary | ICD-10-CM

## 2020-10-04 DIAGNOSIS — I5042 Chronic combined systolic (congestive) and diastolic (congestive) heart failure: Secondary | ICD-10-CM

## 2020-10-04 DIAGNOSIS — N183 Chronic kidney disease, stage 3 unspecified: Secondary | ICD-10-CM

## 2020-10-04 DIAGNOSIS — Z9981 Dependence on supplemental oxygen: Secondary | ICD-10-CM

## 2020-10-04 DIAGNOSIS — Z7951 Long term (current) use of inhaled steroids: Secondary | ICD-10-CM

## 2020-10-04 DIAGNOSIS — Z87891 Personal history of nicotine dependence: Secondary | ICD-10-CM

## 2020-10-04 DIAGNOSIS — N1831 Chronic kidney disease, stage 3a: Secondary | ICD-10-CM | POA: Diagnosis present

## 2020-10-04 DIAGNOSIS — Z7901 Long term (current) use of anticoagulants: Secondary | ICD-10-CM

## 2020-10-04 LAB — BRAIN NATRIURETIC PEPTIDE: B Natriuretic Peptide: 1309 pg/mL — ABNORMAL HIGH (ref 0.0–100.0)

## 2020-10-04 LAB — COMPREHENSIVE METABOLIC PANEL
ALT: 11 U/L (ref 0–44)
AST: 21 U/L (ref 15–41)
Albumin: 2.8 g/dL — ABNORMAL LOW (ref 3.5–5.0)
Alkaline Phosphatase: 39 U/L (ref 38–126)
Anion gap: 6 (ref 5–15)
BUN: 18 mg/dL (ref 8–23)
CO2: 31 mmol/L (ref 22–32)
Calcium: 8.4 mg/dL — ABNORMAL LOW (ref 8.9–10.3)
Chloride: 103 mmol/L (ref 98–111)
Creatinine, Ser: 1.43 mg/dL — ABNORMAL HIGH (ref 0.61–1.24)
GFR, Estimated: 50 mL/min — ABNORMAL LOW (ref >60)
Glucose, Bld: 93 mg/dL (ref 70–99)
Potassium: 4.5 mmol/L (ref 3.5–5.1)
Sodium: 140 mmol/L (ref 135–145)
Total Bilirubin: 0.3 mg/dL (ref 0.3–1.2)
Total Protein: 6.3 g/dL — ABNORMAL LOW (ref 6.5–8.1)

## 2020-10-04 LAB — CBC WITH DIFFERENTIAL/PLATELET
Abs Immature Granulocytes: 0.01 10*3/uL (ref 0.00–0.07)
Basophils Absolute: 0.1 10*3/uL (ref 0.0–0.1)
Basophils Relative: 1 %
Eosinophils Absolute: 0.1 10*3/uL (ref 0.0–0.5)
Eosinophils Relative: 1 %
HCT: 34 % — ABNORMAL LOW (ref 39.0–52.0)
Hemoglobin: 9.6 g/dL — ABNORMAL LOW (ref 13.0–17.0)
Immature Granulocytes: 0 %
Lymphocytes Relative: 35 %
Lymphs Abs: 1.8 10*3/uL (ref 0.7–4.0)
MCH: 27 pg (ref 26.0–34.0)
MCHC: 28.2 g/dL — ABNORMAL LOW (ref 30.0–36.0)
MCV: 95.5 fL (ref 80.0–100.0)
Monocytes Absolute: 0.8 10*3/uL (ref 0.1–1.0)
Monocytes Relative: 16 %
Neutro Abs: 2.3 10*3/uL (ref 1.7–7.7)
Neutrophils Relative %: 47 %
Platelets: 363 10*3/uL (ref 150–400)
RBC: 3.56 MIL/uL — ABNORMAL LOW (ref 4.22–5.81)
RDW: 15.4 % (ref 11.5–15.5)
WBC: 5 10*3/uL (ref 4.0–10.5)
nRBC: 0 % (ref 0.0–0.2)

## 2020-10-04 LAB — D-DIMER, QUANTITATIVE: D-Dimer, Quant: 1.01 ug/mL-FEU — ABNORMAL HIGH (ref 0.00–0.50)

## 2020-10-04 LAB — URINALYSIS, ROUTINE W REFLEX MICROSCOPIC
Bacteria, UA: NONE SEEN
Bilirubin Urine: NEGATIVE
Glucose, UA: 50 mg/dL — AB
Ketones, ur: NEGATIVE mg/dL
Leukocytes,Ua: NEGATIVE
Nitrite: NEGATIVE
Protein, ur: NEGATIVE mg/dL
RBC / HPF: >50 RBC/hpf — ABNORMAL HIGH (ref 0–5)
Specific Gravity, Urine: 1.012 (ref 1.005–1.030)
pH: 7 (ref 5.0–8.0)

## 2020-10-04 LAB — PROCALCITONIN: Procalcitonin: <0.1 ng/mL

## 2020-10-04 LAB — CBG MONITORING, ED: Glucose-Capillary: 159 mg/dL — ABNORMAL HIGH (ref 70–99)

## 2020-10-04 LAB — TROPONIN I (HIGH SENSITIVITY)
Troponin I (High Sensitivity): 27 ng/L — ABNORMAL HIGH (ref <18)
Troponin I (High Sensitivity): 26 ng/L — ABNORMAL HIGH (ref <18)

## 2020-10-04 LAB — RESP PANEL BY RT-PCR (FLU A&B, COVID) ARPGX2
Influenza A by PCR: NEGATIVE
Influenza B by PCR: NEGATIVE
SARS Coronavirus 2 by RT PCR: POSITIVE — AB

## 2020-10-04 LAB — C-REACTIVE PROTEIN: CRP: 3.5 mg/dL — ABNORMAL HIGH (ref <1.0)

## 2020-10-04 MED ORDER — ONDANSETRON HCL 4 MG PO TABS: 4.0000 mg | ORAL_TABLET | Freq: Four times a day (QID) | ORAL | Status: DC | PRN

## 2020-10-04 MED ORDER — CARVEDILOL 12.5 MG PO TABS
12.5000 mg | ORAL_TABLET | Freq: Two times a day (BID) | ORAL | Status: DC
Start: 2020-10-04 — End: 2020-10-05
  Administered 2020-10-04: 12.5 mg via ORAL
  Filled 2020-10-04: qty 1
  Filled 2020-10-04: qty 4

## 2020-10-04 MED ORDER — GUAIFENESIN-DM 100-10 MG/5ML PO SYRP
10.0000 mL | ORAL_SOLUTION | ORAL | Status: DC | PRN
  Administered 2020-10-06: 10 mL via ORAL
  Filled 2020-10-04: qty 10

## 2020-10-04 MED ORDER — INSULIN ASPART 100 UNIT/ML ~~LOC~~ SOLN: 0.0000 [IU] | Freq: Every day | SUBCUTANEOUS | Status: DC

## 2020-10-04 MED ORDER — INSULIN ASPART 100 UNIT/ML ~~LOC~~ SOLN
0.0000 [IU] | Freq: Three times a day (TID) | SUBCUTANEOUS | Status: DC
  Administered 2020-10-05: 2 [IU] via SUBCUTANEOUS
  Administered 2020-10-05: 1 [IU] via SUBCUTANEOUS
  Administered 2020-10-06 (×2): 2 [IU] via SUBCUTANEOUS
  Filled 2020-10-04: qty 1

## 2020-10-04 MED ORDER — SACUBITRIL-VALSARTAN 24-26 MG PO TABS
1.0000 | ORAL_TABLET | Freq: Two times a day (BID) | ORAL | Status: DC
  Administered 2020-10-05 – 2020-10-06 (×3): 1 via ORAL
  Filled 2020-10-04 (×4): qty 1

## 2020-10-04 MED ORDER — DEXAMETHASONE SODIUM PHOSPHATE 10 MG/ML IJ SOLN
6.0000 mg | INTRAMUSCULAR | Status: DC
  Administered 2020-10-04: 6 mg via INTRAVENOUS
  Filled 2020-10-04: qty 1

## 2020-10-04 MED ORDER — FLUCONAZOLE 100 MG PO TABS: 200.0000 mg | ORAL_TABLET | Freq: Every day | ORAL | Status: DC

## 2020-10-04 MED ORDER — FLUTICASONE FUROATE-VILANTEROL 200-25 MCG/INH IN AEPB
1.0000 | INHALATION_SPRAY | Freq: Every day | RESPIRATORY_TRACT | Status: DC
  Administered 2020-10-06: 1 via RESPIRATORY_TRACT
  Filled 2020-10-04: qty 28

## 2020-10-04 MED ORDER — HYDROCOD POLST-CPM POLST ER 10-8 MG/5ML PO SUER: 5.0000 mL | Freq: Two times a day (BID) | ORAL | Status: DC | PRN

## 2020-10-04 MED ORDER — FUROSEMIDE 10 MG/ML IJ SOLN
40.0000 mg | Freq: Once | INTRAMUSCULAR | Status: AC
  Administered 2020-10-04: 40 mg via INTRAVENOUS
  Filled 2020-10-04: qty 4

## 2020-10-04 MED ORDER — POTASSIUM CHLORIDE CRYS ER 20 MEQ PO TBCR
20.0000 meq | EXTENDED_RELEASE_TABLET | Freq: Two times a day (BID) | ORAL | Status: DC
  Administered 2020-10-04: 20 meq via ORAL
  Filled 2020-10-04: qty 2
  Filled 2020-10-04: qty 1

## 2020-10-04 MED ORDER — INSULIN DETEMIR 100 UNIT/ML ~~LOC~~ SOLN
0.0750 [IU]/kg | Freq: Two times a day (BID) | SUBCUTANEOUS | Status: DC
  Administered 2020-10-05 – 2020-10-06 (×4): 5 [IU] via SUBCUTANEOUS
  Filled 2020-10-04 (×6): qty 0.05

## 2020-10-04 MED ORDER — ALBUTEROL SULFATE HFA 108 (90 BASE) MCG/ACT IN AERS
2.0000 | INHALATION_SPRAY | Freq: Four times a day (QID) | RESPIRATORY_TRACT | Status: DC | PRN
  Administered 2020-10-05: 2 via RESPIRATORY_TRACT

## 2020-10-04 MED ORDER — SODIUM CHLORIDE 0.9 % IV SOLN
100.0000 mg | Freq: Every day | INTRAVENOUS | Status: DC
  Administered 2020-10-05 – 2020-10-06 (×2): 100 mg via INTRAVENOUS
  Filled 2020-10-04 (×2): qty 20

## 2020-10-04 MED ORDER — FLUTICASONE FUROATE-VILANTEROL 200-25 MCG/INH IN AEPB: 1.0000 | INHALATION_SPRAY | Freq: Every day | RESPIRATORY_TRACT | Status: DC

## 2020-10-04 MED ORDER — ONDANSETRON HCL 4 MG/2ML IJ SOLN: 4.0000 mg | Freq: Four times a day (QID) | INTRAMUSCULAR | Status: DC | PRN

## 2020-10-04 MED ORDER — ZINC SULFATE 220 (50 ZN) MG PO CAPS
220.0000 mg | ORAL_CAPSULE | Freq: Every day | ORAL | Status: DC
  Administered 2020-10-05 – 2020-10-06 (×2): 220 mg via ORAL
  Filled 2020-10-04 (×2): qty 1

## 2020-10-04 MED ORDER — PANTOPRAZOLE SODIUM 40 MG PO TBEC
40.0000 mg | DELAYED_RELEASE_TABLET | Freq: Every day | ORAL | Status: DC
  Administered 2020-10-05 – 2020-10-06 (×2): 40 mg via ORAL
  Filled 2020-10-04 (×2): qty 1

## 2020-10-04 MED ORDER — SODIUM CHLORIDE 0.9 % IV SOLN
100.0000 mg | INTRAVENOUS | Status: AC
  Administered 2020-10-04 (×2): 100 mg via INTRAVENOUS
  Filled 2020-10-04 (×2): qty 20

## 2020-10-04 MED ORDER — FUROSEMIDE 10 MG/ML IJ SOLN
40.0000 mg | Freq: Two times a day (BID) | INTRAMUSCULAR | Status: DC
  Administered 2020-10-04: 40 mg via INTRAVENOUS
  Filled 2020-10-04: qty 4

## 2020-10-04 MED ORDER — SODIUM CHLORIDE 0.9 % IV SOLN
200.0000 mg | Freq: Once | INTRAVENOUS | Status: DC
  Filled 2020-10-04: qty 40

## 2020-10-04 MED ORDER — SODIUM CHLORIDE 0.9 % IV SOLN: 100.0000 mg | Freq: Every day | INTRAVENOUS | Status: DC

## 2020-10-04 MED ORDER — ALBUTEROL SULFATE HFA 108 (90 BASE) MCG/ACT IN AERS
4.0000 | INHALATION_SPRAY | Freq: Once | RESPIRATORY_TRACT | Status: AC
  Administered 2020-10-04: 4 via RESPIRATORY_TRACT
  Filled 2020-10-04: qty 6.7

## 2020-10-04 MED ORDER — ASCORBIC ACID 500 MG PO TABS
500.0000 mg | ORAL_TABLET | Freq: Every day | ORAL | Status: DC
  Administered 2020-10-05 – 2020-10-06 (×2): 500 mg via ORAL
  Filled 2020-10-04 (×2): qty 1

## 2020-10-04 MED ORDER — APIXABAN 5 MG PO TABS
5.0000 mg | ORAL_TABLET | Freq: Two times a day (BID) | ORAL | Status: DC
  Administered 2020-10-04 – 2020-10-06 (×4): 5 mg via ORAL
  Filled 2020-10-04 (×4): qty 1

## 2020-10-04 MED ORDER — LINAGLIPTIN 5 MG PO TABS: 5.0000 mg | ORAL_TABLET | Freq: Every day | ORAL | Status: DC

## 2020-10-04 MED ORDER — FLUCONAZOLE 100 MG PO TABS
200.0000 mg | ORAL_TABLET | Freq: Every day | ORAL | Status: DC
Start: 2020-10-04 — End: 2020-10-04

## 2020-10-04 MED ORDER — AEROCHAMBER PLUS FLO-VU MEDIUM MISC
1.0000 | Freq: Once | Status: AC
  Administered 2020-10-04: 1
  Filled 2020-10-04: qty 1

## 2020-10-04 NOTE — ED Provider Notes (Signed)
[place] Provider Note   CSN: [id number] Arrival date & time: 10/04/20  1530     History Chief Complaint  Patient presents with  . Shortness of Breath    ULES MARSALA is a 80 y.o. male.  HPI Patient is a 80 year old male with a history of chronic renal insufficiency, asthma, COPD, CHF, hypertension, PE on Eliquis, type 2 diabetes mellitus, who presents the emergency department due to shortness of breath.  Patient states his symptoms started about 2 days ago.  He reports 2 pillow orthopnea but this appears to be baseline for him.  States he wears 2 L of oxygen via nasal cannula only at night.  Reports associated chest pain.  Substernal and constant.  States that when he takes his Lasix it typically improves.  Per records, he takes 20 mg of Lasix twice daily.  Has been using his nebulizer at home with mild short-term relief.  Additionally reports mild epigastric pain.  No nausea, vomiting, diarrhea.  States he has been vaccinated for COVID-19 x2 and has had COVID-19, 3 months ago.  Additionally complains of dysuria for the past 2 to 3 weeks.  Echocardiogram in January 2021: EF of 25 to 30%.     Past Medical History:  Diagnosis Date  . Asthma   . Chronic renal insufficiency   . COPD (chronic obstructive pulmonary disease) ([place])   . Heart failure (HCC)    Acute on chronic combined systolic and diastolic heart failure  . Hypertension   . Pulmonary emboli ([place]) 03/2019  . Renal insufficiency   . Type 2 diabetes mellitus [facility])     Patient Active Problem List   Diagnosis Date Noted  . Acid reflux 06/12/2020  . Candidal esophagitis ([place]) 05/21/2020  . Esophageal dysphagia 02/20/2020  . Abdominal pain, epigastric 02/20/2020  . Abnormal CT of liver 02/20/2020  . AKI (acute kidney injury) ([place]) 09/04/2019  . Acute on chronic systolic CHF (congestive heart failure) ([place]) 09/04/2019  . Trifascicular block 09/04/2019  . Acute on chronic respiratory failure  with hypoxia ([place]) 08/31/2019  . Acute on chronic diastolic CHF (congestive heart failure) ([place]) 08/31/2019  . COPD (chronic obstructive pulmonary disease) ([place]) 12/21/2013  . Bronchiectasis ([place]) 12/21/2013  . Dyspnea on exertion 08/23/2012  . HTN (hypertension) 08/23/2012  . Fever 02/11/2012  . Obesity 01/11/2012  . Chest pain 11/29/2011  . Cardiomyopathy, secondary ([place]) 11/29/2011  . Chronic renal insufficiency   . Type 2 diabetes mellitus ([place])     Past Surgical History:  Procedure Laterality Date  . APPENDECTOMY    . BALLOON DILATION N/A 03/18/2020   Procedure: BALLOON DILATION;  Surgeon: Eloise Harman, DO;  Location[facility] SU[facility];  Service: Endoscopy;  Laterality: N/A;  . CHOLECYSTECTOMY    . COLONOSCOPY  10/2006   Dr.Anwar:normal  . ESOPHAGOGASTRODUODENOSCOPY (EGD) WITH PROPOFOL N/A 03/18/2020   Procedure: ESOPHAGOGASTRODUODENOSCOPY (EGD) WITH PROPOFOL;  Surgeon: Eloise Harman, DO;  Location: [facility] ENDO SU[facility];  Service: Endoscopy;  Laterality: N/A;  2:30pm  . NASAL SINUS SURGERY    . NEPHRECTOMY     RIGHT (Question CA.  No further therapy needed.)  . RIGHT/LEFT HEART CATH AND CORONARY ANGIOGRAPHY N/A 09/03/2019   Procedure: RIGHT/LEFT HEART CATH AND CORONARY ANGIOGRAPHY;  Surgeon: Lorretta Harp, MD;  Location: [facility] [facility];  Service: Cardiovascular;  Laterality: N/A;       Family History  Problem Relation Age of Onset  . Lung cancer Brother   . Stroke Mother   .  Breast cancer Mother   . Colon cancer Neg Hx     Social History   Tobacco Use  . Smoking status: Former Smoker    Packs/day: 1.00    Years: 15.00    Pack years: 15.00    Types: Cigarettes    Start date: 07/14/1957    Quit date: 08/09/1976    Years since quitting: 44.1  . Smokeless tobacco: Former Systems developer    Types: Chew    Quit date: 02/10/2006  . Tobacco comment: chewed tobacco for 10 years  Vaping Use  . Vaping Use: Never used  Substance Use Topics  . Alcohol use: Not Currently     Alcohol/week: 0.0 standard drinks    Comment: no etoh since he was in his 42s  . Drug use: Never    Home Medications Prior to Admission medications   Medication Sig Start Date End Date Taking? Authorizing Provider  albuterol (VENTOLIN HFA) 108 (90 Base) MCG/ACT inhaler Inhale 2 puffs into the lungs every 6 (six) hours as needed for wheezing or shortness of breath.     [provider]  apixaban (ELIQUIS) 5 MG TABS tablet Take 5 mg by mouth 2 (two) times daily.    [provider]  carvedilol (COREG) 12.5 MG tablet Take 1 tablet (12.5 mg total) by mouth 2 (two) times daily. 09/04/20 09/04/21  Clegg, Amy D, NP  cefUROXime (CEFTIN) 500 MG tablet Take 500 mg by mouth 2 (two) times daily with a meal.    [provider]  dapagliflozin propanediol (FARXIGA) 10 MG TABS tablet Take 1 tablet (10 mg total) by mouth daily before breakfast. 08/13/20   Larey Dresser, MD  esomeprazole (NEXIUM) 20 MG capsule Take 20 mg by mouth daily at 12 noon.    [provider]  fluconazole (DIFLUCAN) 200 MG tablet Take 1 tablet (200 mg total) by mouth daily for 21 days. Take 2 tablets on day one then 1 tablet daily for a total of 21 days 09/18/20 10/09/20  Eloise Harman, DO  furosemide (LASIX) 20 MG tablet Take 20 mg by mouth 2 (two) times daily.    [provider]  ipratropium-albuterol (DUONEB) 0.5-2.5 (3) MG/3ML SOLN Take 3 mLs by nebulization every 6 (six) hours as needed (for shortness of breath/wheezing).     [provider]  metFORMIN (GLUCOPHAGE-XR) 500 MG 24 hr tablet Take 1,000 mg by mouth 2 (two) times daily.    [provider]  omeprazole (PRILOSEC) 40 MG capsule Take 1 capsule (40 mg total) by mouth 2 (two) times daily before a meal. 30 minutes before breakfast and 30 minutes before dinner Patient not taking: Reported on 09/17/2020 07/15/20 01/11/21  Eloise Harman, DO  OXYGEN Inhale 2 L into the lungs at bedtime as needed (for shortness of breath).     [provider]  pantoprazole (PROTONIX) 40 MG tablet Take 40 mg by mouth daily. Patient not taking: Reported on 09/17/2020    [provider]  sacubitril-valsartan (ENTRESTO) 24-26 MG Take 1 tablet by mouth 2 (two) times daily. 08/13/20   Larey Dresser, MD  SYMBICORT 160-4.5 MCG/ACT inhaler Inhale 2 puffs into the lungs in the morning and at bedtime. 02/03/20   [provider]    Allergies    Amoxicillin and Ciprofloxacin  Review of Systems   Review of Systems  All other systems reviewed and are negative. Ten systems reviewed and are negative for acute change, except as noted in the HPI.  Physical Exam Updated Vital Signs BP 128/63   Pulse 63   Temp 99 F (37.2 C) (Oral)   Resp 17   Ht 5\' 6"  (1.676 m)   Wt 65.8 kg   SpO2 90%   BMI 23.40 kg/m   Physical Exam Vitals and nursing note reviewed.  Constitutional:      General: He is not in acute distress.    Appearance: Normal appearance. He is well-developed and normal weight. He is not ill-appearing, toxic-appearing or diaphoretic.     Interventions: He is not intubated. HENT:     Head: Normocephalic and atraumatic.     Right Ear: External ear normal.     Left Ear: External ear normal.     Nose: Nose normal.     Mouth/Throat:     Mouth: Mucous membranes are moist.     Pharynx: Oropharynx is clear. No oropharyngeal exudate or posterior oropharyngeal erythema.  Eyes:     Extraocular Movements: Extraocular movements intact.  Cardiovascular:     Rate and Rhythm: Normal rate and regular rhythm.     Pulses: Normal pulses.     Heart sounds: Normal heart sounds. No murmur heard. No friction rub. No gallop.   Pulmonary:     Effort: Tachypnea and respiratory distress present. No bradypnea or accessory muscle usage. He is not intubated.     Breath sounds: No stridor. Examination of the right-lower field reveals decreased breath sounds. Examination of the left-lower field reveals decreased breath sounds.  Decreased breath sounds, wheezing and rales present. No rhonchi.     Comments: Speaking clearly but in short sentences.  Mildly tachypneic.  Rales noted in both lung bases, right greater than left.  Expiratory wheezes noted bilaterally.  Oxygen saturations at 97% on room air. Chest:     Chest wall: No tenderness.  Abdominal:     General: Abdomen is flat.     Tenderness: There is no abdominal tenderness.  Musculoskeletal:        General: Normal range of motion.     Cervical back: Normal range of motion and neck supple. No tenderness.     Right lower leg: No edema.     Left lower leg: No edema.     Comments: No edema noted in the lower extremities.  Skin:    General: Skin is warm and dry.  Neurological:     General: No focal deficit present.     Mental Status: He is alert and oriented to person, place, and time.  Psychiatric:        Mood and Affect: Mood normal.        Behavior: Behavior normal.    ED Results / Procedures / Treatments   Labs (all labs ordered are listed, but only abnormal results are displayed) Labs Reviewed  RESP PANEL BY RT-PCR (FLU A&B, COVID) ARPGX2 - Abnormal; Notable for the following components:      Result Value   SARS Coronavirus 2 by RT PCR POSITIVE (*)    All other components within normal limits  COMPREHENSIVE METABOLIC PANEL - Abnormal; Notable for the following components:   Creatinine, Ser 1.43 (*)    Calcium 8.4 (*)    Total Protein 6.3 (*)    Albumin 2.8 (*)    GFR, Estimated 50 (*)    All other components within normal limits  BRAIN NATRIURETIC PEPTIDE - Abnormal; Notable for the following components:   B Natriuretic Peptide 1,309.0 (*)    All other components within normal limits  CBC WITH DIFFERENTIAL/PLATELET - Abnormal; Notable for the following components:   RBC 3.56 (*)    Hemoglobin 9.6 (*)    HCT 34.0 (*)    MCHC 28.2 (*)    All other components within normal limits  TROPONIN I (HIGH SENSITIVITY) - Abnormal; Notable for the  following components:   Troponin I (High Sensitivity) 27 (*)    All other components within normal limits  URINALYSIS, ROUTINE W REFLEX MICROSCOPIC  TROPONIN I (HIGH SENSITIVITY)   EKG EKG Interpretation  Date/Time:  Saturday October 04 2020 15:47:15 EST Ventricular Rate:  68 PR Interval:    QRS Duration: 145 QT Interval:  440 QTC Calculation: 468 R Axis:   -81 Text Interpretation: Sinus rhythm RBBB and LAFB since last tracing no significant change Confirmed by Noemi Chapel [phone]) on 10/04/2020 3:49:19 PM   Radiology DG Chest Portable 1 View  Result Date: 10/04/2020 CLINICAL DATA:  Shortness of breath history of congestive heart failure and COPD. EXAM: PORTABLE CHEST 1 VIEW COMPARISON:  July 17, 2020 FINDINGS: Enlarged cardiac silhouette. Tortuosity of the aorta. Mediastinal contours appear intact. Subtle diffuse peribronchial airspace opacities. No pleural effusion or pneumothorax. Osseous structures are without acute abnormality. Soft tissues are grossly normal. IMPRESSION: 1. Enlarged cardiac silhouette. 2. Subtle diffuse peribronchial airspace opacities may represent bronchitis or atypical pneumonia. 3. Tortuosity of the aorta. Electronically Signed   By: Fidela Salisbury M.D.   On: 10/04/2020 16:12    Procedures Procedures   Medications Ordered in ED Medications  albuterol (VENTOLIN HFA) 108 (90 Base) MCG/ACT inhaler 4 puff (4 puffs Inhalation Given 10/04/20 1631)  AeroChamber Plus Flo-Vu Medium MISC 1 each (1 each Other Given 10/04/20 1631)  furosemide (LASIX) injection 40 mg (40 mg Intravenous Given 10/04/20 1739)    ED Course  I have reviewed the triage vital signs and the nursing notes.  Pertinent labs & imaging results that were available during my care of the patient were reviewed by me and considered in my medical decision making (see chart for details).  Clinical Course as of 10/04/20 1836  Sat Oct 04, 2020  1640 DG Chest Portable 1 View IMPRESSION: 1.  Enlarged cardiac silhouette. 2. Subtle diffuse peribronchial airspace opacities may represent bronchitis or atypical pneumonia. 3. Tortuosity of the aorta. [LJ]  1726 Patient reassessed.  Oxygen saturations between 93 to 95%.  States his shortness of breath improved with albuterol inhaler.  Will give IV Lasix.  Will reassess. [LJ]  1726 SARS Coronavirus 2 by RT PCR(!): POSITIVE [LJ]    Clinical Course User Index [LJ] Rayna Sexton, PA-C   MDM Rules/Calculators/A&P                          Pt is a 80 y.o. male who presents the emergency department with worsening shortness of breath the past 2 days as well as chest pain.  Labs: CBC with a hemoglobin of 9.6. CMP with a creatinine of 1.43 and a GFR of 50.  Albumin of 2.8 with total protein 6.3. Troponin of 27.  BNP of 1309 Respiratory panel positive for COVID-19.  Imaging: Chest x-ray showing an enlarged cardiac silhouette.  Subtle diffuse peribronchial airspace opacities which may represent bronchitis versus atypical pneumonia.  I, Rayna Sexton, PA-C, personally reviewed and evaluated these images and lab results as part of my medical decision-making.  Patient has a significant history of COPD as well as CHF.  Diffuse rales, rhonchi, wheezing noted on my exam.  Oxygen saturations between 93 to 98% which decreased to the high 80s with ambulation.  Patient also noted worsening chest pain and tightness with ambulation.  Afebrile.  Chronically elevated troponins.  Chronically elevated BNP.  Bedside ultrasound performed with both myself and my attending physician and no pericardial effusion noted.  Patient given albuterol as well as IV Lasix.  Notes some relief for shortness of breath.    Given his age, significant medical history, as well as mild hypoxia secondary to CHF versus COVID-19, believe that admission is warranted.  Will discuss with the medicine team for likely admission.  Note: Portions of this report may have been  transcribed using voice recognition software. Every effort was made to ensure accuracy; however, inadvertent computerized transcription errors may be present.   Final Clinical Impression(s) / ED Diagnoses Final diagnoses:  COVID-19  Dyspnea, unspecified type  Acute on chronic congestive heart failure, unspecified heart failure type [facility])   Rx / DC Orders ED Discharge Orders    None       Rayna Sexton, PA-C 10/04/20 1843    Noemi Chapel, MD 10/07/20 1039

## 2020-10-04 NOTE — ED Provider Notes (Signed)
I have personally seen and examined this patient, he is 80 years old, history of COPD and congestive heart failure.  He presents to the hospital with increasing shortness of breath, his oxygen level is about 93 to 95% on room air, he is not tachycardic febrile or hypotensive.  He has no edema or JVD.  On his lung exam he has mild wheezing, scattered rales and rhonchi, speaks in full sentences.  I have looked at his chest x-ray which shows cardiomegaly and some interstitial bronchitic findings.  Labs show elevated BNP which is chronically elevated, minimal other changes and nothing else that would account for the patient's shortness of breath.  EKG is also unremarkable and unchanged  We will do bedside ultrasound to look for effusion of the heart otherwise patient may benefit from ongoing COPD treatment, possibly an antibiotic.  Medical screening examination/treatment/procedure(s) were conducted as a shared visit with non-physician practitioner(s) and myself.  I personally evaluated the patient during the encounter.  Clinical Impression:   Final diagnoses:  COVID-19  Dyspnea, unspecified type  Acute on chronic congestive heart failure, unspecified heart failure type (HCC)         Noemi Chapel, MD 10/05/20 [phone]

## 2020-10-04 NOTE — ED Notes (Signed)
Respiratory contacted for aerochamber.

## 2020-10-04 NOTE — ED Notes (Signed)
Pt ambulated in the room with Covid positive status. Oxygen saturation dropped to 89 percent and the pt complains of chest pain.

## 2020-10-04 NOTE — H&P (Addendum)
History and Physical  Gregory Mitchell IEP:[id number] DOB: 08-31-1940 DOA: 10/04/2020  Referring physician: Rayna Sexton, PA-C, EDP PCP: Neale Burly, MD  Outpatient Specialists:   Patient Coming From: home  Chief Complaint: SOB, cough  HPI: Gregory Mitchell is a 80 y.o. male with a history of COPD, combined systolic and diastolic heart failure, hypertension, history of PE on chronic anticoagulation, diabetes. Patient seen for shortness of breath, cough, body aches, chills, which have been going on for approximately 3 to 4 days. His symptoms have been worsening, especially in the last 24 hours. Shortness of breath is worse with exertion and improved with rest. He has been using his inhalers which minimally helped. Vaccination for COVID x2 doses and had Covid diagnosed approximately 2-1/2 months ago. He was admitted, given remdesivir and steroids. He tolerated these well.  Emergency Department Course: X-ray x-ray shows subtle diffuse peribronchial airspace opacities consistent with atypical pneumonia. White count normal. BNP 1,300. Troponin 27. Creatinine 1.43, which appears to be at his baseline. Covid positive. Patient's O2 drops to 88% with ambulation.  Review of Systems:   Pt denies any nausea, vomiting, diarrhea, constipation, abdominal pain, palpitations, headache, vision changes, lightheadedness, dizziness, melena, rectal bleeding.  Review of systems are otherwise negative  Past Medical History:  Diagnosis Date  . Asthma   . Chronic renal insufficiency   . COPD (chronic obstructive pulmonary disease) ([place])   . Heart failure (HCC)    Acute on chronic combined systolic and diastolic heart failure  . Hypertension   . Pulmonary emboli ([place]) 03/2019  . Renal insufficiency   . Type 2 diabetes mellitus ([place])    Past Surgical History:  Procedure Laterality Date  . APPENDECTOMY    . BALLOON DILATION N/A 03/18/2020   Procedure: BALLOON DILATION;  Surgeon: Eloise Harman, DO;   Location: [facility];  Service: Endoscopy;  Laterality: N/A;  . CHOLECYSTECTOMY    . COLONOSCOPY  10/2006   Dr.Anwar:normal  . ESOPHAGOGASTRODUODENOSCOPY (EGD) WITH PROPOFOL N/A 03/18/2020   Procedure: ESOPHAGOGASTRODUODENOSCOPY (EGD) WITH PROPOFOL;  Surgeon: Eloise Harman, DO;  Location: [facility];  Service: Endoscopy;  Laterality: N/A;  2:30pm  . NASAL SINUS SURGERY    . NEPHRECTOMY     RIGHT (Question CA.  No further therapy needed.)  . RIGHT/LEFT HEART CATH AND CORONARY ANGIOGRAPHY N/A 09/03/2019   Procedure: RIGHT/LEFT HEART CATH AND CORONARY ANGIOGRAPHY;  Surgeon: Lorretta Harp, MD;  Location: [facility];  Service: Cardiovascular;  Laterality: N/A;   Social History:  reports that he quit smoking about 44 years ago. His smoking use included cigarettes. He started smoking about 63 years ago. He has a 15.00 pack-year smoking history. He quit smokeless tobacco use about 14 years ago.  His smokeless tobacco use included chew. He reports previous alcohol use. He reports that he does not use drugs. Patient lives at home  Allergies  Allergen Reactions  . Amoxicillin     Makes eyes turn yellow and burn  . Ciprofloxacin     Pt not sure    Family History  Problem Relation Age of Onset  . Lung cancer Brother   . Stroke Mother   . Breast cancer Mother   . Colon cancer Neg Hx       Prior to Admission medications   Medication Sig Start Date End Date Taking? Authorizing Provider  albuterol (VENTOLIN HFA) 108 (90 Base) MCG/ACT inhaler Inhale 2 puffs into the lungs every 6 (six) hours as  needed for wheezing or shortness of breath.     [provider]  apixaban (ELIQUIS) 5 MG TABS tablet Take 5 mg by mouth 2 (two) times daily.    [provider]  carvedilol (COREG) 12.5 MG tablet Take 1 tablet (12.5 mg total) by mouth 2 (two) times daily. 09/04/20 09/04/21  Clegg, Amy D, NP  cefUROXime (CEFTIN) 500 MG tablet Take 500 mg by mouth 2 (two) times daily with a  meal.    [provider]  dapagliflozin propanediol (FARXIGA) 10 MG TABS tablet Take 1 tablet (10 mg total) by mouth daily before breakfast. 08/13/20   Larey Dresser, MD  esomeprazole (NEXIUM) 20 MG capsule Take 20 mg by mouth daily at 12 noon.    [provider]  fluconazole (DIFLUCAN) 200 MG tablet Take 1 tablet (200 mg total) by mouth daily for 21 days. Take 2 tablets on day one then 1 tablet daily for a total of 21 days 09/18/20 10/09/20  Eloise Harman, DO  furosemide (LASIX) 20 MG tablet Take 20 mg by mouth 2 (two) times daily.    [provider]  ipratropium-albuterol (DUONEB) 0.5-2.5 (3) MG/3ML SOLN Take 3 mLs by nebulization every 6 (six) hours as needed (for shortness of breath/wheezing).     [provider]  metFORMIN (GLUCOPHAGE-XR) 500 MG 24 hr tablet Take 1,000 mg by mouth 2 (two) times daily.    [provider]  omeprazole (PRILOSEC) 40 MG capsule Take 1 capsule (40 mg total) by mouth 2 (two) times daily before a meal. 30 minutes before breakfast and 30 minutes before dinner Patient not taking: Reported on 09/17/2020 07/15/20 01/11/21  Eloise Harman, DO  OXYGEN Inhale 2 L into the lungs at bedtime as needed (for shortness of breath).    [provider]  pantoprazole (PROTONIX) 40 MG tablet Take 40 mg by mouth daily. Patient not taking: Reported on 09/17/2020    [provider]  sacubitril-valsartan (ENTRESTO) 24-26 MG Take 1 tablet by mouth 2 (two) times daily. 08/13/20   Larey Dresser, MD  SYMBICORT 160-4.5 MCG/ACT inhaler Inhale 2 puffs into the lungs in the morning and at bedtime. 02/03/20   [provider]    Physical Exam: BP 120/69   Pulse 67   Temp 99 F (37.2 C) (Oral)   Resp 19   Ht 5\' 6"  (1.676 m)   Wt 65.8 kg   SpO2 92%   BMI 23.40 kg/m   . General: Elderly male. Awake and alert and oriented x3. No acute cardiopulmonary distress.  [name] HEENT: Normocephalic atraumatic.  Right and left ears normal  in appearance.  Pupils equal, round, reactive to light. Extraocular muscles are intact. Sclerae anicteric and noninjected.  Moist mucosal membranes. No mucosal lesions.  . Neck: Neck supple without lymphadenopathy. No carotid bruits. No masses palpated.  . Cardiovascular: Regular rate with normal S1-S2 sounds. No murmurs, rubs, gallops auscultated. No JVD.  [name] Respiratory: Rales bilaterally diffusely. Coarse wheezing with expiration.  No accessory muscle use. . Abdomen: Soft, nontender, nondistended. Active bowel sounds. No masses or hepatosplenomegaly  . Skin: No rashes, lesions, or ulcerations.  Dry, warm to touch. 2+ dorsalis pedis and radial pulses. . Musculoskeletal: No calf or leg pain. All major joints not erythematous nontender.  No upper or lower joint deformation.  Good ROM.  No contractures  . Psychiatric: Intact judgment and insight. Pleasant and cooperative. . Neurologic: No focal neurological deficits. Strength is 5/5 and symmetric in upper  and lower extremities.  Cranial nerves II through XII are grossly intact.           Labs on Admission: I have personally reviewed following labs and imaging studies  CBC: Recent Labs  Lab 10/04/20 1602  WBC 5.0  NEUTROABS 2.3  HGB 9.6*  HCT 34.0*  MCV 95.5  PLT 426   Basic Metabolic Panel: Recent Labs  Lab 10/04/20 1602  NA 140  K 4.5  CL 103  CO2 31  GLUCOSE 93  BUN 18  CREATININE 1.43*  CALCIUM 8.4*   GFR: Estimated Creatinine Clearance: 37.2 mL/min (A) (by C-G formula based on SCr of 1.43 mg/dL (H)). Liver Function Tests: Recent Labs  Lab 10/04/20 1602  AST 21  ALT 11  ALKPHOS 39  BILITOT 0.3  PROT 6.3*  ALBUMIN 2.8*   No results for input(s): LIPASE, AMYLASE in the last 168 hours. No results for input(s): AMMONIA in the last 168 hours. Coagulation Profile: No results for input(s): INR, PROTIME in the last 168 hours. Cardiac Enzymes: No results for input(s): CKTOTAL, CKMB, CKMBINDEX, TROPONINI in the last  168 hours. BNP (last 3 results) No results for input(s): PROBNP in the last 8760 hours. HbA1C: No results for input(s): HGBA1C in the last 72 hours. CBG: No results for input(s): GLUCAP in the last 168 hours. Lipid Profile: No results for input(s): CHOL, HDL, LDLCALC, TRIG, CHOLHDL, LDLDIRECT in the last 72 hours. Thyroid Function Tests: No results for input(s): TSH, T4TOTAL, FREET4, T3FREE, THYROIDAB in the last 72 hours. Anemia Panel: No results for input(s): VITAMINB12, FOLATE, FERRITIN, TIBC, IRON, RETICCTPCT in the last 72 hours. Urine analysis:    Component Value Date/Time   COLORURINE YELLOW 10/04/2020 1826   APPEARANCEUR CLEAR 10/04/2020 1826   LABSPEC 1.012 10/04/2020 1826   PHURINE 7.0 10/04/2020 1826   GLUCOSEU 50 (A) 10/04/2020 1826   HGBUR LARGE (A) 10/04/2020 1826   BILIRUBINUR NEGATIVE 10/04/2020 1826   KETONESUR NEGATIVE 10/04/2020 1826   PROTEINUR NEGATIVE 10/04/2020 1826   NITRITE NEGATIVE 10/04/2020 1826   LEUKOCYTESUR NEGATIVE 10/04/2020 1826   Sepsis Labs: @LABRCNTIP (procalcitonin:4,lacticidven:4) ) Recent Results (from the past 240 hour(s))  Resp Panel by RT-PCR (Flu A&B, Covid) Nasopharyngeal Swab     Status: Abnormal   Collection Time: 10/04/20  4:05 PM   Specimen: Nasopharyngeal Swab; Nasopharyngeal(NP) swabs in vial transport medium  Result Value Ref Range Status   SARS Coronavirus 2 by RT PCR POSITIVE (A) NEGATIVE Final    Comment: RESULT CALLED TO, READ BACK BY AND VERIFIED WITH: OAKLEY,B @ 1750 ON 10/04/20 BY JUW (NOTE) SARS-CoV-2 target nucleic acids are DETECTED.  The SARS-CoV-2 RNA is generally detectable in upper respiratory specimens during the acute phase of infection. Positive results are indicative of the presence of the identified virus, but do not rule out bacterial infection or co-infection with other pathogens not detected by the test. Clinical correlation with patient history and other diagnostic information is necessary to  determine patient infection status. The expected result is Negative.  Fact Sheet for Patients: EntrepreneurPulse.com.au  Fact Sheet for Healthcare Providers: IncredibleEmployment.be  This test is not yet approved or cleared by the [place] FDA and  has been authorized for detection and/or diagnosis of SARS-CoV-2 by FDA under an Emergency Use Authorization (EUA).  This EUA will remain in effect (meaning this test can be  used) for the duration of  the COVID-19 declaration under Section 564(b)(1) of the Act, 21 U.S.C. section 360bbb-3(b)(1), unless the authorization is terminated or  revoked sooner.     Influenza A by PCR NEGATIVE NEGATIVE Final   Influenza B by PCR NEGATIVE NEGATIVE Final    Comment: (NOTE) The Xpert Xpress SARS-CoV-2/FLU/RSV plus assay is intended as an aid in the diagnosis of influenza from Nasopharyngeal swab specimens and should not be used as a sole basis for treatment. Nasal washings and aspirates are unacceptable for Xpert Xpress SARS-CoV-2/FLU/RSV testing.  Fact Sheet for Patients: EntrepreneurPulse.com.au  Fact Sheet for Healthcare Providers: IncredibleEmployment.be  This test is not yet approved or cleared by the [place] FDA and has been authorized for detection and/or diagnosis of SARS-CoV-2 by FDA under an Emergency Use Authorization (EUA). This EUA will remain in effect (meaning this test can be used) for the duration of the COVID-19 declaration under Section 564(b)(1) of the Act, 21 U.S.C. section 360bbb-3(b)(1), unless the authorization is terminated or revoked.  Performed at [facility], [address], [place], [place] [address]      Radiological Exams on Admission: DG Chest Portable 1 View  Result Date: 10/04/2020 CLINICAL DATA:  Shortness of breath history of congestive heart failure and COPD. EXAM: PORTABLE CHEST 1 VIEW COMPARISON:  July 17, 2020  FINDINGS: Enlarged cardiac silhouette. Tortuosity of the aorta. Mediastinal contours appear intact. Subtle diffuse peribronchial airspace opacities. No pleural effusion or pneumothorax. Osseous structures are without acute abnormality. Soft tissues are grossly normal. IMPRESSION: 1. Enlarged cardiac silhouette. 2. Subtle diffuse peribronchial airspace opacities may represent bronchitis or atypical pneumonia. 3. Tortuosity of the aorta. Electronically Signed   By: Fidela Salisbury M.D.   On: 10/04/2020 16:12    EKG: Independently reviewed. Sinus rhythm with left and right bundle branch block. No acute ST changes. No change from prior  Assessment/Plan: Active Problems:   Chronic renal insufficiency   Type 2 diabetes mellitus (HCC)   Cardiomyopathy, secondary (HCC)   HTN (hypertension)   COPD (chronic obstructive pulmonary disease) (HCC)   Acute on chronic respiratory failure with hypoxia (HCC)   Acute on chronic systolic CHF (congestive heart failure) ([place])   Pneumonia due to COVID-19 virus    This patient was discussed with the ED physician, including pertinent vitals, physical exam findings, labs, and imaging.  We also discussed care given by the ED provider.  1. Acute on chronic respiratory failure with hypoxia a. Exertional hypoxia 2. Covid pneumonia Check CRP, D-dimer, procalcitonin now Daily labs: CBC, CMP, CRP, D-dimer, ferritin, magnesium, phosphorus Discussed use of steroids: Solu-Medrol, remdesivir Proning Supplemental oxygen Zinc and vitamin C Albuterol HFA as needed Antitussives 3. Acute on chronic systolic heart failure with secondary cardiomyopathy Telemetry monitoring Strict I/O Daily Weights Diuresis: Lasix 40 mg IV Potassium: 40 mEq twice a day by mouth Echo cardiac exam tomorrow Repeat BMP tomorrow 4. COPD a. Currently stable b. Continue long-acting inhalers 5. Hypertension a. Continue antihypertensives 6. Type 2 diabetes a. Hold oral  hypoglycemics b. Start Tradjenta c. We will give insulin for now with sliding scale and CBGs 7. Chronic renal insufficiency a. Appears to be at baseline  DVT prophylaxis: On Eliquis due to history of PE Consultants: None Code Status: Full code Family Communication: None Disposition Plan: Patient should be able to return home following hospitalization   Truett Mainland, DO

## 2020-10-04 NOTE — ED Triage Notes (Signed)
shortness of breath with body aches for 2 days

## 2020-10-04 NOTE — ED Notes (Signed)
Pt given meal brought in by daughter

## 2020-10-04 NOTE — ED Notes (Addendum)
Pt O2 dropping to 84 on RA, pt placed on 2L [place].   Pt stated he uses oxygen at home at night.   Pt now @ 94%

## 2020-10-04 NOTE — ED Notes (Signed)
Date and time results received: 10/04/20 5:52 PM  Test: Covid Critical Value: Positive  Name of Provider Notified: Rolla Plate PA  Orders Received? Or Actions Taken? See Orders

## 2020-10-05 ENCOUNTER — Observation Stay (HOSPITAL_COMMUNITY): Payer: Medicare HMO

## 2020-10-05 DIAGNOSIS — J441 Chronic obstructive pulmonary disease with (acute) exacerbation: Secondary | ICD-10-CM | POA: Diagnosis present

## 2020-10-05 DIAGNOSIS — I4510 Unspecified right bundle-branch block: Secondary | ICD-10-CM | POA: Diagnosis present

## 2020-10-05 DIAGNOSIS — J1282 Pneumonia due to coronavirus disease 2019: Secondary | ICD-10-CM | POA: Diagnosis present

## 2020-10-05 DIAGNOSIS — I130 Hypertensive heart and chronic kidney disease with heart failure and stage 1 through stage 4 chronic kidney disease, or unspecified chronic kidney disease: Secondary | ICD-10-CM | POA: Diagnosis present

## 2020-10-05 DIAGNOSIS — I5042 Chronic combined systolic (congestive) and diastolic (congestive) heart failure: Secondary | ICD-10-CM

## 2020-10-05 DIAGNOSIS — J9621 Acute and chronic respiratory failure with hypoxia: Secondary | ICD-10-CM | POA: Diagnosis present

## 2020-10-05 DIAGNOSIS — Z86711 Personal history of pulmonary embolism: Secondary | ICD-10-CM | POA: Diagnosis not present

## 2020-10-05 DIAGNOSIS — B3781 Candidal esophagitis: Secondary | ICD-10-CM | POA: Diagnosis present

## 2020-10-05 DIAGNOSIS — I5021 Acute systolic (congestive) heart failure: Secondary | ICD-10-CM

## 2020-10-05 DIAGNOSIS — Z9981 Dependence on supplemental oxygen: Secondary | ICD-10-CM | POA: Diagnosis not present

## 2020-10-05 DIAGNOSIS — Z7901 Long term (current) use of anticoagulants: Secondary | ICD-10-CM | POA: Diagnosis not present

## 2020-10-05 DIAGNOSIS — I5043 Acute on chronic combined systolic (congestive) and diastolic (congestive) heart failure: Secondary | ICD-10-CM

## 2020-10-05 DIAGNOSIS — N1831 Chronic kidney disease, stage 3a: Secondary | ICD-10-CM | POA: Diagnosis present

## 2020-10-05 DIAGNOSIS — J440 Chronic obstructive pulmonary disease with acute lower respiratory infection: Secondary | ICD-10-CM | POA: Diagnosis present

## 2020-10-05 DIAGNOSIS — I429 Cardiomyopathy, unspecified: Secondary | ICD-10-CM | POA: Diagnosis present

## 2020-10-05 DIAGNOSIS — U071 COVID-19: Secondary | ICD-10-CM | POA: Diagnosis present

## 2020-10-05 DIAGNOSIS — Z7984 Long term (current) use of oral hypoglycemic drugs: Secondary | ICD-10-CM | POA: Diagnosis not present

## 2020-10-05 DIAGNOSIS — Z79899 Other long term (current) drug therapy: Secondary | ICD-10-CM | POA: Diagnosis not present

## 2020-10-05 DIAGNOSIS — Z87891 Personal history of nicotine dependence: Secondary | ICD-10-CM | POA: Diagnosis not present

## 2020-10-05 DIAGNOSIS — Z905 Acquired absence of kidney: Secondary | ICD-10-CM | POA: Diagnosis not present

## 2020-10-05 DIAGNOSIS — E1122 Type 2 diabetes mellitus with diabetic chronic kidney disease: Secondary | ICD-10-CM | POA: Diagnosis present

## 2020-10-05 DIAGNOSIS — N183 Chronic kidney disease, stage 3 unspecified: Secondary | ICD-10-CM

## 2020-10-05 DIAGNOSIS — Z7951 Long term (current) use of inhaled steroids: Secondary | ICD-10-CM | POA: Diagnosis not present

## 2020-10-05 LAB — MAGNESIUM: Magnesium: 2.7 mg/dL — ABNORMAL HIGH (ref 1.7–2.4)

## 2020-10-05 LAB — CBC WITH DIFFERENTIAL/PLATELET
Abs Immature Granulocytes: 0.01 10*3/uL (ref 0.00–0.07)
Basophils Absolute: 0 10*3/uL (ref 0.0–0.1)
Basophils Relative: 1 %
Eosinophils Absolute: 0 10*3/uL (ref 0.0–0.5)
Eosinophils Relative: 0 %
HCT: 37.5 % — ABNORMAL LOW (ref 39.0–52.0)
Hemoglobin: 10.6 g/dL — ABNORMAL LOW (ref 13.0–17.0)
Immature Granulocytes: 0 %
Lymphocytes Relative: 24 %
Lymphs Abs: 0.6 10*3/uL — ABNORMAL LOW (ref 0.7–4.0)
MCH: 26.5 pg (ref 26.0–34.0)
MCHC: 28.3 g/dL — ABNORMAL LOW (ref 30.0–36.0)
MCV: 93.8 fL (ref 80.0–100.0)
Monocytes Absolute: 0.1 10*3/uL (ref 0.1–1.0)
Monocytes Relative: 4 %
Neutro Abs: 1.7 10*3/uL (ref 1.7–7.7)
Neutrophils Relative %: 71 %
Platelets: 403 10*3/uL — ABNORMAL HIGH (ref 150–400)
RBC: 4 MIL/uL — ABNORMAL LOW (ref 4.22–5.81)
RDW: 15.2 % (ref 11.5–15.5)
WBC: 2.4 10*3/uL — ABNORMAL LOW (ref 4.0–10.5)
nRBC: 0 % (ref 0.0–0.2)

## 2020-10-05 LAB — D-DIMER, QUANTITATIVE: D-Dimer, Quant: 0.78 ug/mL-FEU — ABNORMAL HIGH (ref 0.00–0.50)

## 2020-10-05 LAB — COMPREHENSIVE METABOLIC PANEL
ALT: 11 U/L (ref 0–44)
AST: 20 U/L (ref 15–41)
Albumin: 2.8 g/dL — ABNORMAL LOW (ref 3.5–5.0)
Alkaline Phosphatase: 49 U/L (ref 38–126)
Anion gap: 9 (ref 5–15)
BUN: 22 mg/dL (ref 8–23)
CO2: 29 mmol/L (ref 22–32)
Calcium: 8.5 mg/dL — ABNORMAL LOW (ref 8.9–10.3)
Chloride: 100 mmol/L (ref 98–111)
Creatinine, Ser: 1.32 mg/dL — ABNORMAL HIGH (ref 0.61–1.24)
GFR, Estimated: 55 mL/min — ABNORMAL LOW (ref >60)
Glucose, Bld: 182 mg/dL — ABNORMAL HIGH (ref 70–99)
Potassium: 5 mmol/L (ref 3.5–5.1)
Sodium: 138 mmol/L (ref 135–145)
Total Bilirubin: 0.2 mg/dL — ABNORMAL LOW (ref 0.3–1.2)
Total Protein: 6.7 g/dL (ref 6.5–8.1)

## 2020-10-05 LAB — ABO/RH: ABO/RH(D): O POS

## 2020-10-05 LAB — ECHOCARDIOGRAM COMPLETE
Area-P 1/2: 5.43 cm2
Height: 66 in
S' Lateral: 5.79 cm
Weight: 2320 oz

## 2020-10-05 LAB — C-REACTIVE PROTEIN: CRP: 3.4 mg/dL — ABNORMAL HIGH (ref <1.0)

## 2020-10-05 LAB — GLUCOSE, CAPILLARY
Glucose-Capillary: 168 mg/dL — ABNORMAL HIGH (ref 70–99)
Glucose-Capillary: 144 mg/dL — ABNORMAL HIGH (ref 70–99)

## 2020-10-05 LAB — FERRITIN: Ferritin: 71 ng/mL (ref 24–336)

## 2020-10-05 LAB — CBG MONITORING, ED: Glucose-Capillary: 130 mg/dL — ABNORMAL HIGH (ref 70–99)

## 2020-10-05 MED ORDER — METHYLPREDNISOLONE SODIUM SUCC 125 MG IJ SOLR
60.0000 mg | Freq: Two times a day (BID) | INTRAMUSCULAR | Status: DC
  Administered 2020-10-05 – 2020-10-06 (×3): 60 mg via INTRAVENOUS
  Filled 2020-10-05 (×3): qty 2

## 2020-10-05 MED ORDER — CARVEDILOL 12.5 MG PO TABS
12.5000 mg | ORAL_TABLET | Freq: Two times a day (BID) | ORAL | Status: DC
Start: 2020-10-05 — End: 2020-10-06
  Administered 2020-10-05 – 2020-10-06 (×3): 12.5 mg via ORAL
  Filled 2020-10-05 (×2): qty 1
  Filled 2020-10-05: qty 4

## 2020-10-05 MED ORDER — CARVEDILOL 3.125 MG PO TABS: 6.2500 mg | ORAL_TABLET | Freq: Two times a day (BID) | ORAL | Status: DC

## 2020-10-05 MED ORDER — PERFLUTREN LIPID MICROSPHERE
1.0000 mL | INTRAVENOUS | Status: AC | PRN
  Administered 2020-10-05: 3 mL via INTRAVENOUS
  Filled 2020-10-05: qty 10

## 2020-10-05 MED ORDER — DAPAGLIFLOZIN PROPANEDIOL 10 MG PO TABS
10.0000 mg | ORAL_TABLET | Freq: Every day | ORAL | Status: DC
  Administered 2020-10-05 – 2020-10-06 (×2): 10 mg via ORAL
  Filled 2020-10-05 (×4): qty 1

## 2020-10-05 MED ORDER — IPRATROPIUM-ALBUTEROL 20-100 MCG/ACT IN AERS
1.0000 | INHALATION_SPRAY | Freq: Four times a day (QID) | RESPIRATORY_TRACT | Status: DC
  Administered 2020-10-05 (×3): 1 via RESPIRATORY_TRACT
  Filled 2020-10-05: qty 4

## 2020-10-05 MED ORDER — FLUCONAZOLE 100 MG PO TABS
200.0000 mg | ORAL_TABLET | Freq: Every day | ORAL | Status: DC
  Administered 2020-10-05 – 2020-10-06 (×2): 200 mg via ORAL
  Filled 2020-10-05 (×2): qty 2

## 2020-10-05 MED ORDER — FUROSEMIDE 20 MG PO TABS
20.0000 mg | ORAL_TABLET | Freq: Two times a day (BID) | ORAL | Status: DC
  Administered 2020-10-05 – 2020-10-06 (×3): 20 mg via ORAL
  Filled 2020-10-05 (×3): qty 1

## 2020-10-05 MED ORDER — SODIUM CHLORIDE 0.9 % IV SOLN
INTRAVENOUS | Status: DC | PRN
  Administered 2020-10-05: 250 mL via INTRAVENOUS

## 2020-10-05 NOTE — Plan of Care (Signed)

## 2020-10-05 NOTE — Progress Notes (Signed)
  Echocardiogram 2D Echocardiogram with 3D and definity has been performed.  Darlina Sicilian M 10/05/2020, 8:58 AM

## 2020-10-05 NOTE — Progress Notes (Signed)
PROGRESS NOTE  BENNO BRENSINGER GYK:[id number] DOB: Oct 15, 1940 DOA: 10/04/2020 PCP: Neale Burly, MD  Brief History:  80 year old male with a history of systolic and diastolic CHF, COPD, bronchiectasis, hypertension, diabetes mellitus type 2, CKD stage III, PE August 2020, chronic respiratory failure on 2 L at nighttime presenting with 3 to 4-day history of shortness of breath.  Patient states that his shortness of breath has been worse in the past 1 to 2 days.  He continues to have chest pain on a daily basis.  He describes his chest pain primary exacerbated by eating as well as every day when he wakes up in the morning.  Patient denies any fevers, chills, headache, sore throat, nausea, vomiting, diarrhea, abdominal pain.  He has chronic dysuria.  He denies any hematochezia or melena.  The patient quit smoking 40 years ago.  He states that his weight has been about the same at home.  His last weight was 119 pounds.  He endorses compliance with his medications.  He denies any worsening peripheral edema. Notably, the patient was admitted to [facility] from 07/17/2020 to 07/21/2020 for COVID-19 and CHF exacerbation.  During that hospitalization, the patient received 4 days of Merrem and was discharged home with doxycycline and dexamethasone. In the emergency department, the patient had low-grade temperature 99.0 F.  He was hemodynamically stable with oxygen saturations 95% on 2 L.  With ambulation, the patient desaturated to 89% on room air.  Assessment/Plan: Acute on chronic respiratory failure with hypoxia secondary to COVID-19 -Currently stable on 2 L -Oxygen saturation 89% on room air -CRP 3.5 -D-dimer 1.01 -Check ferritin -PCT <0.10 -Personally reviewed chest x-ray--bilateral patchy infiltrates -Start IV Solu-Medrol -Continue remdesivir -continues to remain sob  COPD exacerbation -Continue IV Solu-Medrol -Continue bronchodilators  Acute on chronic systolic and diastolic  CHF -7/93/9030 echo EF 25-30%, grade 2 DD, global HK, mild MR/TR -The patient received 2 doses of IV furosemide on 10/05/2019 -He is now clinically euvolemic -Restart home dose furosemide -Continue Entresto -Continue carvedilol -Continue Farxiga  Chest pain -Primary likely GI etiology by history -09/03/2019 heart catheterization--normal coronaries -Personally reviewed EKG--sinus rhythm, right bundle branch block  CKD stage IIIa -Baseline creatinine 1.2-1.4 -A.m. BMP  Diabetes mellitus type 2, controlled -08/31/2019 hemoglobin A1c 6.1 -NovoLog sliding scale -Continue Farxiga  Essential hypertension -Continue carvedilol  History of pulmonary embolus -Continue apixaban  Candida esophagitis -Patient currently on 21-day course of fluconazole per GI which was started on 09/18/2020 -Continue fluconazole    Status is: Observation  The patient will require care spanning > 2 midnights and should be moved to inpatient because: IV treatments appropriate due to intensity of illness or inability to take PO  Dispo: The patient is from: Home              Anticipated d/c is to: Home              Patient currently is not medically stable to d/c.   Difficult to place patient No        Family Communication:  no Family at bedside  Consultants:  none  Code Status:  FULL   DVT Prophylaxis:apixaban   Procedures: As Listed in Progress Note Above  Antibiotics: None      Subjective: Patient states that he remains short of breath that he does a little better than yesterday.  He denies any fevers, chills, hemoptysis, nausea, vomiting or diarrhea, abdominal pain.  He has no  chest pain presently.  Objective: Vitals:   10/05/20 0230 10/05/20 0430 10/05/20 0500 10/05/20 0700  BP: 107/61 92/62 117/72 119/69  Pulse: 64 84 69 (!) 57  Resp: 18 18 20    Temp:      TempSrc:      SpO2: 100% 100% 98% 100%  Weight:      Height:       No intake or output data in the 24 hours  ending 10/05/20 0748 Weight change:  Exam:   General:  Pt is alert, follows commands appropriately, not in acute distress  HEENT: No icterus, No thrush, No neck mass, [place]/AT  Cardiovascular: RRR, S1/S2, no rubs, no gallops  Respiratory: Bilateral scattered rales.  Bilateral expiratory wheeze.  Abdomen: Soft/+BS, non tender, non distended, no guarding  Extremities: No edema, No lymphangitis, No petechiae, No rashes, no synovitis   Data Reviewed: I have personally reviewed following labs and imaging studies Basic Metabolic Panel: Recent Labs  Lab 10/04/20 1602 10/05/20 0521  NA 140 138  K 4.5 5.0  CL 103 100  CO2 31 29  GLUCOSE 93 182*  BUN 18 22  CREATININE 1.43* 1.32*  CALCIUM 8.4* 8.5*  MG  --  2.7*   Liver Function Tests: Recent Labs  Lab 10/04/20 1602 10/05/20 0521  AST 21 20  ALT 11 11  ALKPHOS 39 49  BILITOT 0.3 0.2*  PROT 6.3* 6.7  ALBUMIN 2.8* 2.8*   No results for input(s): LIPASE, AMYLASE in the last 168 hours. No results for input(s): AMMONIA in the last 168 hours. Coagulation Profile: No results for input(s): INR, PROTIME in the last 168 hours. CBC: Recent Labs  Lab 10/04/20 1602 10/05/20 0521  WBC 5.0 2.4*  NEUTROABS 2.3 1.7  HGB 9.6* 10.6*  HCT 34.0* 37.5*  MCV 95.5 93.8  PLT 363 403*   Cardiac Enzymes: No results for input(s): CKTOTAL, CKMB, CKMBINDEX, TROPONINI in the last 168 hours. BNP: Invalid input(s): POCBNP CBG: Recent Labs  Lab 10/04/20 2255  GLUCAP 159*   HbA1C: No results for input(s): HGBA1C in the last 72 hours. Urine analysis:    Component Value Date/Time   COLORURINE YELLOW 10/04/2020 1826   APPEARANCEUR CLEAR 10/04/2020 1826   LABSPEC 1.012 10/04/2020 1826   PHURINE 7.0 10/04/2020 1826   GLUCOSEU 50 (A) 10/04/2020 1826   HGBUR LARGE (A) 10/04/2020 1826   BILIRUBINUR NEGATIVE 10/04/2020 1826   KETONESUR NEGATIVE 10/04/2020 1826   PROTEINUR NEGATIVE 10/04/2020 1826   NITRITE NEGATIVE 10/04/2020 1826    LEUKOCYTESUR NEGATIVE 10/04/2020 1826   Sepsis Labs: @LABRCNTIP (procalcitonin:4,lacticidven:4) ) Recent Results (from the past 240 hour(s))  Resp Panel by RT-PCR (Flu A&B, Covid) Nasopharyngeal Swab     Status: Abnormal   Collection Time: 10/04/20  4:05 PM   Specimen: Nasopharyngeal Swab; Nasopharyngeal(NP) swabs in vial transport medium  Result Value Ref Range Status   SARS Coronavirus 2 by RT PCR POSITIVE (A) NEGATIVE Final    Comment: RESULT CALLED TO, READ BACK BY AND VERIFIED WITH: OAKLEY,B @ 1750 ON 10/04/20 BY JUW (NOTE) SARS-CoV-2 target nucleic acids are DETECTED.  The SARS-CoV-2 RNA is generally detectable in upper respiratory specimens during the acute phase of infection. Positive results are indicative of the presence of the identified virus, but do not rule out bacterial infection or co-infection with other pathogens not detected by the test. Clinical correlation with patient history and other diagnostic information is necessary to determine patient infection status. The expected result is Negative.  Fact Sheet for Patients: EntrepreneurPulse.com.au  Fact Sheet for Healthcare Providers: IncredibleEmployment.be  This test is not yet approved or cleared by the [place] FDA and  has been authorized for detection and/or diagnosis of SARS-CoV-2 by FDA under an Emergency Use Authorization (EUA).  This EUA will remain in effect (meaning this test can be  used) for the duration of  the COVID-19 declaration under Section 564(b)(1) of the Act, 21 U.S.C. section 360bbb-3(b)(1), unless the authorization is terminated or revoked sooner.     Influenza A by PCR NEGATIVE NEGATIVE Final   Influenza B by PCR NEGATIVE NEGATIVE Final    Comment: (NOTE) The Xpert Xpress SARS-CoV-2/FLU/RSV plus assay is intended as an aid in the diagnosis of influenza from Nasopharyngeal swab specimens and should not be used as a sole basis for treatment.  Nasal washings and aspirates are unacceptable for Xpert Xpress SARS-CoV-2/FLU/RSV testing.  Fact Sheet for Patients: EntrepreneurPulse.com.au  Fact Sheet for Healthcare Providers: IncredibleEmployment.be  This test is not yet approved or cleared by the [place] FDA and has been authorized for detection and/or diagnosis of SARS-CoV-2 by FDA under an Emergency Use Authorization (EUA). This EUA will remain in effect (meaning this test can be used) for the duration of the COVID-19 declaration under Section 564(b)(1) of the Act, 21 U.S.C. section 360bbb-3(b)(1), unless the authorization is terminated or revoked.  Performed at [facility], [address] [place], [place] [address]      Scheduled Meds: . apixaban  5 mg Oral BID  . vitamin C  500 mg Oral Daily  . carvedilol  12.5 mg Oral BID  . dapagliflozin propanediol  10 mg Oral Daily  . fluconazole  200 mg Oral Daily  . fluticasone furoate-vilanterol  1 puff Inhalation Daily  . furosemide  20 mg Oral BID  . insulin aspart  0-5 Units Subcutaneous QHS  . insulin aspart  0-9 Units Subcutaneous TID WC  . insulin detemir  0.075 Units/kg Subcutaneous BID  . methylPREDNISolone (SOLU-MEDROL) injection  60 mg Intravenous Q12H  . pantoprazole  40 mg Oral Daily  . sacubitril-valsartan  1 tablet Oral BID  . zinc sulfate  220 mg Oral Daily   Continuous Infusions: . remdesivir 100 mg in NS 100 mL      Procedures/Studies: DG Chest Portable 1 View  Result Date: 10/04/2020 CLINICAL DATA:  Shortness of breath history of congestive heart failure and COPD. EXAM: PORTABLE CHEST 1 VIEW COMPARISON:  July 17, 2020 FINDINGS: Enlarged cardiac silhouette. Tortuosity of the aorta. Mediastinal contours appear intact. Subtle diffuse peribronchial airspace opacities. No pleural effusion or pneumothorax. Osseous structures are without acute abnormality. Soft tissues are grossly normal. IMPRESSION: 1. Enlarged  cardiac silhouette. 2. Subtle diffuse peribronchial airspace opacities may represent bronchitis or atypical pneumonia. 3. Tortuosity of the aorta. Electronically Signed   By: Fidela Salisbury M.D.   On: 10/04/2020 16:12    Orson Eva, DO  [facility]  If 7PM-7AM, please contact night-coverage [email or web address] Password [facility] 10/05/2020, 7:48 AM   LOS: 0 days

## 2020-10-06 ENCOUNTER — Inpatient Hospital Stay (HOSPITAL_COMMUNITY): Admission: RE | Admit: 2020-10-06 | Payer: Medicare HMO | Source: Ambulatory Visit

## 2020-10-06 DIAGNOSIS — J9621 Acute and chronic respiratory failure with hypoxia: Secondary | ICD-10-CM | POA: Diagnosis not present

## 2020-10-06 DIAGNOSIS — J441 Chronic obstructive pulmonary disease with (acute) exacerbation: Secondary | ICD-10-CM | POA: Diagnosis not present

## 2020-10-06 DIAGNOSIS — I5043 Acute on chronic combined systolic (congestive) and diastolic (congestive) heart failure: Secondary | ICD-10-CM | POA: Diagnosis not present

## 2020-10-06 DIAGNOSIS — N1831 Chronic kidney disease, stage 3a: Secondary | ICD-10-CM | POA: Diagnosis not present

## 2020-10-06 LAB — GLUCOSE, CAPILLARY
Glucose-Capillary: 161 mg/dL — ABNORMAL HIGH (ref 70–99)
Glucose-Capillary: 198 mg/dL — ABNORMAL HIGH (ref 70–99)

## 2020-10-06 LAB — CBC WITH DIFFERENTIAL/PLATELET
Abs Immature Granulocytes: 0.02 10*3/uL (ref 0.00–0.07)
Basophils Absolute: 0 10*3/uL (ref 0.0–0.1)
Basophils Relative: 0 %
Eosinophils Absolute: 0 10*3/uL (ref 0.0–0.5)
Eosinophils Relative: 0 %
HCT: 36.6 % — ABNORMAL LOW (ref 39.0–52.0)
Hemoglobin: 10.7 g/dL — ABNORMAL LOW (ref 13.0–17.0)
Immature Granulocytes: 0 %
Lymphocytes Relative: 13 %
Lymphs Abs: 0.6 10*3/uL — ABNORMAL LOW (ref 0.7–4.0)
MCH: 26.6 pg (ref 26.0–34.0)
MCHC: 29.2 g/dL — ABNORMAL LOW (ref 30.0–36.0)
MCV: 90.8 fL (ref 80.0–100.0)
Monocytes Absolute: 0.2 10*3/uL (ref 0.1–1.0)
Monocytes Relative: 3 %
Neutro Abs: 4.2 10*3/uL (ref 1.7–7.7)
Neutrophils Relative %: 84 %
Platelets: 428 10*3/uL — ABNORMAL HIGH (ref 150–400)
RBC: 4.03 MIL/uL — ABNORMAL LOW (ref 4.22–5.81)
RDW: 15 % (ref 11.5–15.5)
WBC: 5 10*3/uL (ref 4.0–10.5)
nRBC: 0 % (ref 0.0–0.2)

## 2020-10-06 LAB — COMPREHENSIVE METABOLIC PANEL
ALT: 10 U/L (ref 0–44)
AST: 12 U/L — ABNORMAL LOW (ref 15–41)
Albumin: 2.6 g/dL — ABNORMAL LOW (ref 3.5–5.0)
Alkaline Phosphatase: 36 U/L — ABNORMAL LOW (ref 38–126)
Anion gap: 10 (ref 5–15)
BUN: 29 mg/dL — ABNORMAL HIGH (ref 8–23)
CO2: 29 mmol/L (ref 22–32)
Calcium: 8.8 mg/dL — ABNORMAL LOW (ref 8.9–10.3)
Chloride: 98 mmol/L (ref 98–111)
Creatinine, Ser: 1.31 mg/dL — ABNORMAL HIGH (ref 0.61–1.24)
GFR, Estimated: 55 mL/min — ABNORMAL LOW (ref >60)
Glucose, Bld: 176 mg/dL — ABNORMAL HIGH (ref 70–99)
Potassium: 4.5 mmol/L (ref 3.5–5.1)
Sodium: 137 mmol/L (ref 135–145)
Total Bilirubin: 0.3 mg/dL (ref 0.3–1.2)
Total Protein: 5.9 g/dL — ABNORMAL LOW (ref 6.5–8.1)

## 2020-10-06 LAB — D-DIMER, QUANTITATIVE: D-Dimer, Quant: 0.66 ug/mL-FEU — ABNORMAL HIGH (ref 0.00–0.50)

## 2020-10-06 LAB — MAGNESIUM: Magnesium: 2.6 mg/dL — ABNORMAL HIGH (ref 1.7–2.4)

## 2020-10-06 LAB — FERRITIN: Ferritin: 73 ng/mL (ref 24–336)

## 2020-10-06 LAB — C-REACTIVE PROTEIN: CRP: 2.1 mg/dL — ABNORMAL HIGH (ref <1.0)

## 2020-10-06 MED ORDER — IPRATROPIUM-ALBUTEROL 20-100 MCG/ACT IN AERS
1.0000 | INHALATION_SPRAY | Freq: Three times a day (TID) | RESPIRATORY_TRACT | Status: DC
  Administered 2020-10-06: 1 via RESPIRATORY_TRACT

## 2020-10-06 MED ORDER — IPRATROPIUM-ALBUTEROL 20-100 MCG/ACT IN AERS: 1.0000 | INHALATION_SPRAY | Freq: Four times a day (QID) | RESPIRATORY_TRACT | Status: DC

## 2020-10-06 MED ORDER — PREDNISONE 50 MG PO TABS: 50.0000 mg | ORAL_TABLET | Freq: Two times a day (BID) | ORAL | 0 refills | Status: DC

## 2020-10-06 MED ORDER — PREDNISONE 20 MG PO TABS: 50.0000 mg | ORAL_TABLET | Freq: Two times a day (BID) | ORAL | Status: DC

## 2020-10-06 NOTE — Plan of Care (Signed)

## 2020-10-06 NOTE — Discharge Summary (Signed)
Physician Discharge Summary  Gregory Mitchell:[id number] DOB: 04-Feb-1941 DOA: 10/04/2020  PCP: Neale Burly, MD  Admit date: 10/04/2020 Discharge date: 10/06/2020  Admitted From: Home Disposition:  Home  Recommendations for Outpatient Follow-up:  1. Follow up with PCP in 1-2 weeks 2. Please obtain BMP/CBC in one week  Home Health: YES Equipment/Devices: 2L at night; HHPT  Discharge Condition: Stable CODE STATUS: FULL Diet recommendation: Heart Healthy   Brief/Interim Summary: 80 year old male with a history of systolic and diastolic CHF, COPD, bronchiectasis, hypertension, diabetes mellitus type 2, CKD stage III, PE August 2020, chronic respiratory failure on 2 L at nighttime presenting with 3 to 4-day history of shortness of breath.  Patient states that his shortness of breath has been worse in the past 1 to 2 days.  He continues to have chest pain on a daily basis.  He describes his chest pain primary exacerbated by eating as well as every day when he wakes up in the morning.  Patient denies any fevers, chills, headache, sore throat, nausea, vomiting, diarrhea, abdominal pain.  He has chronic dysuria.  He denies any hematochezia or melena.  The patient quit smoking 40 years ago.  He states that his weight has been about the same at home.  His last weight was 119 pounds.  He endorses compliance with his medications.  He denies any worsening peripheral edema. Notably, the patient was admitted to [facility] from 07/17/2020 to 07/21/2020 for COVID-19 and CHF exacerbation.  During that hospitalization, the patient received 4 days of Merrem and was discharged home with doxycycline and dexamethasone. In the emergency department, the patient had low-grade temperature 99.0 F.  He was hemodynamically stable with oxygen saturations 95% on 2 L.  With ambulation, the patient desaturated to 89% on room air.  He was subsequently weaned back to RA   Discharge Diagnoses:  Acute on chronic respiratory  failure with hypoxia secondary to COVID-19 -Currently stable on 2 L -Oxygen saturation 89% on room air -CRP 3.5>>2.1 -D-dimer 1.01>>0.66 -Check ferritin71>>73 -PCT <0.10 -Personally reviewed chest x-ray--bilateral patchy infiltrates -Start IV Solu-Medrol>>d/c home with prednisone x 7 more days -Continue remdesivir--had 3 days during hospitalization -ambulatory pulse ox on day of d/c did not show desaturation <88%  COPD exacerbation -Continue IV Solu-Medrol -Continue bronchodilators  Acute on chronic systolic and diastolic CHF -3/54/5625 echo EF 25-30%, grade 2 DD, global HK, mild MR/TR -The patient received 2 doses of IV furosemide on 10/05/2019 -He is now clinically euvolemic -Restart home dose furosemide -Continue Entresto -Continue carvedilol -Continue Farxiga  Chest pain -Primary likely GI etiology by history -09/03/2019 heart catheterization--normal coronaries -Personally reviewed EKG--sinus rhythm, right bundle branch block  CKD stage IIIa -Baseline creatinine 1.2-1.4 -serum creatinine 1.31 on day of d/c  Diabetes mellitus type 2, controlled -08/31/2019 hemoglobin A1c 6.1 -NovoLog sliding scale -Continue Farxiga  Essential hypertension -Continue carvedilol  History of pulmonary embolus -Continue apixaban  Candida esophagitis -Patient currently on 21-day course of fluconazole per GI which was started on 09/18/2020 -Continue fluconazole   Discharge Instructions   Allergies as of 10/06/2020      Reactions   Amoxicillin Other (See Comments)   Makes eyes turn yellow and burn   Ciprofloxacin    Pt not sure   Sulfa Antibiotics Rash   Pt not sure of reaction      Medication List    TAKE these medications   albuterol 108 (90 Base) MCG/ACT inhaler Commonly known as: VENTOLIN HFA Inhale 2 puffs into the lungs every  6 (six) hours as needed for wheezing or shortness of breath.   apixaban 5 MG Tabs tablet Commonly known as: ELIQUIS Take 5 mg by mouth  2 (two) times daily.   carvedilol 12.5 MG tablet Commonly known as: Coreg Take 1 tablet (12.5 mg total) by mouth 2 (two) times daily.   dapagliflozin propanediol 10 MG Tabs tablet Commonly known as: Farxiga Take 1 tablet (10 mg total) by mouth daily before breakfast.   diclofenac Sodium 1 % Gel Commonly known as: VOLTAREN Apply 1 application topically 4 (four) times daily.   Entresto 24-26 MG Generic drug: sacubitril-valsartan Take 1 tablet by mouth 2 (two) times daily.   fluconazole 200 MG tablet Commonly known as: Diflucan Take 1 tablet (200 mg total) by mouth daily for 21 days. Take 2 tablets on day one then 1 tablet daily for a total of 21 days   furosemide 20 MG tablet Commonly known as: LASIX Take 20 mg by mouth 2 (two) times daily.   hydrALAZINE 10 MG tablet Commonly known as: APRESOLINE Take 10 mg by mouth 3 (three) times daily.   HYDROcodone-acetaminophen 7.5-325 MG tablet Commonly known as: NORCO Take 1 tablet by mouth 2 (two) times daily as needed for moderate pain.   ipratropium-albuterol 0.5-2.5 (3) MG/3ML Soln Commonly known as: DUONEB Take 3 mLs by nebulization every 6 (six) hours as needed (for shortness of breath/wheezing).   metFORMIN 500 MG 24 hr tablet Commonly known as: GLUCOPHAGE-XR Take 1,000 mg by mouth 2 (two) times daily.   mirtazapine 15 MG tablet Commonly known as: REMERON Take 15 mg by mouth daily.   omeprazole 40 MG capsule Commonly known as: PRILOSEC Take 1 capsule (40 mg total) by mouth 2 (two) times daily before a meal. 30 minutes before breakfast and 30 minutes before dinner   OXYGEN Inhale 2 L into the lungs at bedtime as needed (for shortness of breath).   predniSONE 50 MG tablet Commonly known as: DELTASONE Take 1 tablet (50 mg total) by mouth 2 (two) times daily with a meal.   Symbicort 160-4.5 MCG/ACT inhaler Generic drug: budesonide-formoterol Inhale 2 puffs into the lungs in the morning and at bedtime.   tamsulosin  0.4 MG Caps capsule Commonly known as: FLOMAX Take 0.4 mg by mouth daily.       Allergies  Allergen Reactions  . Amoxicillin Other (See Comments)    Makes eyes turn yellow and burn  . Ciprofloxacin     Pt not sure  . Sulfa Antibiotics Rash    Pt not sure of reaction    Consultations:  none   Procedures/Studies: DG Chest Portable 1 View  Result Date: 10/04/2020 CLINICAL DATA:  Shortness of breath history of congestive heart failure and COPD. EXAM: PORTABLE CHEST 1 VIEW COMPARISON:  July 17, 2020 FINDINGS: Enlarged cardiac silhouette. Tortuosity of the aorta. Mediastinal contours appear intact. Subtle diffuse peribronchial airspace opacities. No pleural effusion or pneumothorax. Osseous structures are without acute abnormality. Soft tissues are grossly normal. IMPRESSION: 1. Enlarged cardiac silhouette. 2. Subtle diffuse peribronchial airspace opacities may represent bronchitis or atypical pneumonia. 3. Tortuosity of the aorta. Electronically Signed   By: Fidela Salisbury M.D.   On: 10/04/2020 16:12   ECHOCARDIOGRAM COMPLETE  Result Date: 10/05/2020    ECHOCARDIOGRAM REPORT   Patient Name:   RIGGINS CISEK Date of Exam: 10/05/2020 Medical Rec #:  [id number]      Height:       66.0 in Accession #:    [id number]  Weight:       145.0 lb Date of Birth:  November 04, 1940      BSA:          1.744 m Patient Age:    5 years       BP:           123/75 mmHg Patient Gender: M              HR:           57 bpm. Exam Location:  Inpatient Procedure: 2D Echo, Limited Echo, Cardiac Doppler, Color Doppler and            Intracardiac Opacification Agent Indications:    CHF-Acute Systolic R44.31  History:        Patient has prior history of Echocardiogram examinations, most                 recent 08/31/2019. CHF, COPD, Signs/Symptoms:Shortness of Breath                 and Chest Pain; Risk Factors:Hypertension, Diabetes and Former                 Smoker. COVID 19. Chronic kidney disease.  Sonographer:     Darlina Sicilian RDCS Referring Phys: [place]  1. Left ventricular ejection fraction, by estimation, is 20 to 25%. The left ventricle has severely decreased function. The left ventricle demonstrates global hypokinesis. The left ventricular internal cavity size was severely dilated. Left ventricular diastolic parameters are consistent with Grade III diastolic dysfunction (restrictive).  2. Right ventricular systolic function is normal. The right ventricular size is normal. There is normal pulmonary artery systolic pressure.  3. Left atrial size was mildly dilated.  4. The mitral valve is normal in structure. Trivial mitral valve regurgitation. No evidence of mitral stenosis.  5. The aortic valve is tricuspid. Aortic valve regurgitation is not visualized. No aortic stenosis is present.  6. The inferior vena cava is normal in size with greater than 50% respiratory variability, suggesting right atrial pressure of 3 mmHg. FINDINGS  Left Ventricle: Left ventricular ejection fraction, by estimation, is 20 to 25%. The left ventricle has severely decreased function. The left ventricle demonstrates global hypokinesis. Definity contrast agent was given IV to delineate the left ventricular endocardial borders. The left ventricular internal cavity size was severely dilated. There is no left ventricular hypertrophy. Left ventricular diastolic parameters are consistent with Grade III diastolic dysfunction (restrictive). Right Ventricle: The right ventricular size is normal. Right ventricular systolic function is normal. There is normal pulmonary artery systolic pressure. The tricuspid regurgitant velocity is 1.69 m/s, and with an assumed right atrial pressure of 3 mmHg,  the estimated right ventricular systolic pressure is 54.0 mmHg. Left Atrium: Left atrial size was mildly dilated. Right Atrium: Right atrial size was normal in size. Pericardium: There is no evidence of pericardial effusion. Mitral Valve:  The mitral valve is normal in structure. Trivial mitral valve regurgitation. No evidence of mitral valve stenosis. Tricuspid Valve: The tricuspid valve is normal in structure. Tricuspid valve regurgitation is mild . No evidence of tricuspid stenosis. Aortic Valve: The aortic valve is tricuspid. Aortic valve regurgitation is not visualized. No aortic stenosis is present. Pulmonic Valve: The pulmonic valve was not well visualized. Pulmonic valve regurgitation is not visualized. No evidence of pulmonic stenosis. Aorta: The aortic root is normal in size and structure. Venous: The inferior vena cava is normal in size with greater than 50% respiratory variability,  suggesting right atrial pressure of 3 mmHg.  LEFT VENTRICLE PLAX 2D LVIDd:         6.72 cm  Diastology LVIDs:         5.79 cm  LV e' medial:    3.02 cm/s LV PW:         1.02 cm  LV E/e' medial:  23.5 LV IVS:        0.91 cm  LV e' lateral:   3.64 cm/s LVOT diam:     2.50 cm  LV E/e' lateral: 19.5 LV SV:         47 LV SV Index:   27 LVOT Area:     4.91 cm  RIGHT VENTRICLE RV S prime:     9.62 cm/s TAPSE (M-mode): 2.9 cm LEFT ATRIUM             Index       RIGHT ATRIUM           Index LA diam:        4.70 cm 2.69 cm/m  RA Area:     15.70 cm LA Vol (A2C):   50.0 ml 28.66 ml/m RA Volume:   36.50 ml  20.92 ml/m LA Vol (A4C):   66.4 ml 38.07 ml/m LA Biplane Vol: 61.0 ml 34.97 ml/m  AORTIC VALVE LVOT Vmax:   54.30 cm/s LVOT Vmean:  41.500 cm/s LVOT VTI:    0.096 m  AORTA Ao Root diam: 3.40 cm MITRAL VALVE               TRICUSPID VALVE MV Area (PHT): 5.43 cm    TR Peak grad:   11.4 mmHg MV Decel Time: 140 msec    TR Vmax:        169.00 cm/s MV E velocity: 70.87 cm/s                            SHUNTS                            Systemic VTI:  0.10 m                            Systemic Diam: 2.50 cm Kirk Ruths MD Electronically signed by Kirk Ruths MD Signature Date/Time: 10/05/2020/10:38:14 AM    Final          Discharge Exam: Vitals:   10/06/20  0736 10/06/20 0904  BP:  116/62  Pulse:  64  Resp:    Temp:    SpO2: 99% 98%   Vitals:   10/06/20 0500 10/06/20 0632 10/06/20 0736 10/06/20 0904  BP:  115/81  116/62  Pulse:  62  64  Resp:  16    Temp:  97.7 F (36.5 C)    TempSrc:  Oral    SpO2:  99% 99% 98%  Weight: 60.3 kg     Height:        General: Pt is alert, awake, not in acute distress Cardiovascular: RRR, S1/S2 +, no rubs, no gallops Respiratory: bibasilar rales. No wheeze Abdominal: Soft, NT, ND, bowel sounds + Extremities: no edema, no cyanosis   The results of significant diagnostics from this hospitalization (including imaging, microbiology, ancillary and laboratory) are listed below for reference.    Significant Diagnostic Studies: DG Chest Portable 1 View  Result Date: 10/04/2020 CLINICAL DATA:  Shortness  of breath history of congestive heart failure and COPD. EXAM: PORTABLE CHEST 1 VIEW COMPARISON:  July 17, 2020 FINDINGS: Enlarged cardiac silhouette. Tortuosity of the aorta. Mediastinal contours appear intact. Subtle diffuse peribronchial airspace opacities. No pleural effusion or pneumothorax. Osseous structures are without acute abnormality. Soft tissues are grossly normal. IMPRESSION: 1. Enlarged cardiac silhouette. 2. Subtle diffuse peribronchial airspace opacities may represent bronchitis or atypical pneumonia. 3. Tortuosity of the aorta. Electronically Signed   By: Fidela Salisbury M.D.   On: 10/04/2020 16:12   ECHOCARDIOGRAM COMPLETE  Result Date: 10/05/2020    ECHOCARDIOGRAM REPORT   Patient Name:   Gregory Mitchell Date of Exam: 10/05/2020 Medical Rec #:  [id number]      Height:       66.0 in Accession #:    [id number]     Weight:       145.0 lb Date of Birth:  1941/04/04      BSA:          1.744 m Patient Age:    80 years       BP:           123/75 mmHg Patient Gender: M              HR:           57 bpm. Exam Location:  Inpatient Procedure: 2D Echo, Limited Echo, Cardiac Doppler, Color Doppler and             Intracardiac Opacification Agent Indications:    CHF-Acute Systolic E42.35  History:        Patient has prior history of Echocardiogram examinations, most                 recent 08/31/2019. CHF, COPD, Signs/Symptoms:Shortness of Breath                 and Chest Pain; Risk Factors:Hypertension, Diabetes and Former                 Smoker. COVID 19. Chronic kidney disease.  Sonographer:    Darlina Sicilian RDCS Referring Phys: [place]  1. Left ventricular ejection fraction, by estimation, is 20 to 25%. The left ventricle has severely decreased function. The left ventricle demonstrates global hypokinesis. The left ventricular internal cavity size was severely dilated. Left ventricular diastolic parameters are consistent with Grade III diastolic dysfunction (restrictive).  2. Right ventricular systolic function is normal. The right ventricular size is normal. There is normal pulmonary artery systolic pressure.  3. Left atrial size was mildly dilated.  4. The mitral valve is normal in structure. Trivial mitral valve regurgitation. No evidence of mitral stenosis.  5. The aortic valve is tricuspid. Aortic valve regurgitation is not visualized. No aortic stenosis is present.  6. The inferior vena cava is normal in size with greater than 50% respiratory variability, suggesting right atrial pressure of 3 mmHg. FINDINGS  Left Ventricle: Left ventricular ejection fraction, by estimation, is 20 to 25%. The left ventricle has severely decreased function. The left ventricle demonstrates global hypokinesis. Definity contrast agent was given IV to delineate the left ventricular endocardial borders. The left ventricular internal cavity size was severely dilated. There is no left ventricular hypertrophy. Left ventricular diastolic parameters are consistent with Grade III diastolic dysfunction (restrictive). Right Ventricle: The right ventricular size is normal. Right ventricular systolic function is  normal. There is normal pulmonary artery systolic pressure. The tricuspid regurgitant velocity is 1.69 m/s, and with an assumed  right atrial pressure of 3 mmHg,  the estimated right ventricular systolic pressure is 02.5 mmHg. Left Atrium: Left atrial size was mildly dilated. Right Atrium: Right atrial size was normal in size. Pericardium: There is no evidence of pericardial effusion. Mitral Valve: The mitral valve is normal in structure. Trivial mitral valve regurgitation. No evidence of mitral valve stenosis. Tricuspid Valve: The tricuspid valve is normal in structure. Tricuspid valve regurgitation is mild . No evidence of tricuspid stenosis. Aortic Valve: The aortic valve is tricuspid. Aortic valve regurgitation is not visualized. No aortic stenosis is present. Pulmonic Valve: The pulmonic valve was not well visualized. Pulmonic valve regurgitation is not visualized. No evidence of pulmonic stenosis. Aorta: The aortic root is normal in size and structure. Venous: The inferior vena cava is normal in size with greater than 50% respiratory variability, suggesting right atrial pressure of 3 mmHg.  LEFT VENTRICLE PLAX 2D LVIDd:         6.72 cm  Diastology LVIDs:         5.79 cm  LV e' medial:    3.02 cm/s LV PW:         1.02 cm  LV E/e' medial:  23.5 LV IVS:        0.91 cm  LV e' lateral:   3.64 cm/s LVOT diam:     2.50 cm  LV E/e' lateral: 19.5 LV SV:         47 LV SV Index:   27 LVOT Area:     4.91 cm  RIGHT VENTRICLE RV S prime:     9.62 cm/s TAPSE (M-mode): 2.9 cm LEFT ATRIUM             Index       RIGHT ATRIUM           Index LA diam:        4.70 cm 2.69 cm/m  RA Area:     15.70 cm LA Vol (A2C):   50.0 ml 28.66 ml/m RA Volume:   36.50 ml  20.92 ml/m LA Vol (A4C):   66.4 ml 38.07 ml/m LA Biplane Vol: 61.0 ml 34.97 ml/m  AORTIC VALVE LVOT Vmax:   54.30 cm/s LVOT Vmean:  41.500 cm/s LVOT VTI:    0.096 m  AORTA Ao Root diam: 3.40 cm MITRAL VALVE               TRICUSPID VALVE MV Area (PHT): 5.43 cm    TR Peak  grad:   11.4 mmHg MV Decel Time: 140 msec    TR Vmax:        169.00 cm/s MV E velocity: 70.87 cm/s                            SHUNTS                            Systemic VTI:  0.10 m                            Systemic Diam: 2.50 cm Kirk Ruths MD Electronically signed by Kirk Ruths MD Signature Date/Time: 10/05/2020/10:38:14 AM    Final      Microbiology: Recent Results (from the past 240 hour(s))  Resp Panel by RT-PCR (Flu A&B, Covid) Nasopharyngeal Swab     Status: Abnormal   Collection Time: 10/04/20  4:05 PM  Specimen: Nasopharyngeal Swab; Nasopharyngeal(NP) swabs in vial transport medium  Result Value Ref Range Status   SARS Coronavirus 2 by RT PCR POSITIVE (A) NEGATIVE Final    Comment: RESULT CALLED TO, READ BACK BY AND VERIFIED WITH: OAKLEY,B @ 1750 ON 10/04/20 BY JUW (NOTE) SARS-CoV-2 target nucleic acids are DETECTED.  The SARS-CoV-2 RNA is generally detectable in upper respiratory specimens during the acute phase of infection. Positive results are indicative of the presence of the identified virus, but do not rule out bacterial infection or co-infection with other pathogens not detected by the test. Clinical correlation with patient history and other diagnostic information is necessary to determine patient infection status. The expected result is Negative.  Fact Sheet for Patients: EntrepreneurPulse.com.au  Fact Sheet for Healthcare Providers: IncredibleEmployment.be  This test is not yet approved or cleared by the [place] FDA and  has been authorized for detection and/or diagnosis of SARS-CoV-2 by FDA under an Emergency Use Authorization (EUA).  This EUA will remain in effect (meaning this test can be  used) for the duration of  the COVID-19 declaration under Section 564(b)(1) of the Act, 21 U.S.C. section 360bbb-3(b)(1), unless the authorization is terminated or revoked sooner.     Influenza A by PCR NEGATIVE NEGATIVE  Final   Influenza B by PCR NEGATIVE NEGATIVE Final    Comment: (NOTE) The Xpert Xpress SARS-CoV-2/FLU/RSV plus assay is intended as an aid in the diagnosis of influenza from Nasopharyngeal swab specimens and should not be used as a sole basis for treatment. Nasal washings and aspirates are unacceptable for Xpert Xpress SARS-CoV-2/FLU/RSV testing.  Fact Sheet for Patients: EntrepreneurPulse.com.au  Fact Sheet for Healthcare Providers: IncredibleEmployment.be  This test is not yet approved or cleared by the [place] FDA and has been authorized for detection and/or diagnosis of SARS-CoV-2 by FDA under an Emergency Use Authorization (EUA). This EUA will remain in effect (meaning this test can be used) for the duration of the COVID-19 declaration under Section 564(b)(1) of the Act, 21 U.S.C. section 360bbb-3(b)(1), unless the authorization is terminated or revoked.  Performed at [facility], [address] [place], [place] [address]      Labs: Basic Metabolic Panel: Recent Labs  Lab 10/04/20 1602 10/05/20 0521 10/06/20 0530  NA 140 138 137  K 4.5 5.0 4.5  CL 103 100 98  CO2 31 29 29   GLUCOSE 93 182* 176*  BUN 18 22 29*  CREATININE 1.43* 1.32* 1.31*  CALCIUM 8.4* 8.5* 8.8*  MG  --  2.7* 2.6*   Liver Function Tests: Recent Labs  Lab 10/04/20 1602 10/05/20 0521 10/06/20 0530  AST 21 20 12*  ALT 11 11 10   ALKPHOS 39 49 36*  BILITOT 0.3 0.2* 0.3  PROT 6.3* 6.7 5.9*  ALBUMIN 2.8* 2.8* 2.6*   No results for input(s): LIPASE, AMYLASE in the last 168 hours. No results for input(s): AMMONIA in the last 168 hours. CBC: Recent Labs  Lab 10/04/20 1602 10/05/20 0521 10/06/20 0530  WBC 5.0 2.4* 5.0  NEUTROABS 2.3 1.7 4.2  HGB 9.6* 10.6* 10.7*  HCT 34.0* 37.5* 36.6*  MCV 95.5 93.8 90.8  PLT 363 403* 428*   Cardiac Enzymes: No results for input(s): CKTOTAL, CKMB, CKMBINDEX, TROPONINI in the last 168 hours. BNP: Invalid  input(s): POCBNP CBG: Recent Labs  Lab 10/04/20 2255 10/05/20 0853 10/05/20 1616 10/05/20 2254 10/06/20 0820  GLUCAP 159* 130* 168* 144* 161*    Time coordinating discharge:  36 minutes  Signed:  Orson Eva,  DO [facility] Pager: [phone] 10/06/2020, 11:37 AM

## 2020-10-06 NOTE — TOC Transition Note (Signed)
Transition of Care [facility]) - CM/SW Discharge Note   Patient Details  Name: Gregory Mitchell MRN: [id number] Date of Birth: 10/31/1940  Transition of Care [facility]) CM/SW Contact:  Shade Flood, LCSW Phone Number: 10/06/2020, 11:57 AM   Clinical Narrative:     Pt stable for dc home today per MD. Damaris Schooner with pt's daughter who states pt will return to her home at dc. Discussed HHPT recommendation with daughter who is agreeable. Discussed provider options and referred as requested. Clinical and orders faxed to [facility] at [phone]. Reminded pt's daughter to bring a portable O2 tank with her to pick up patient.  There are no other TOC needs for dc.  Expected Discharge Plan: [place] Barriers to Discharge: Barriers Resolved   Patient Goals and CMS Choice Patient states their goals for this hospitalization and ongoing recovery are:: get better CMS Medicare.gov Compare Post Acute Care list provided to:: Patient Represenative (must comment) Choice offered to / list presented to : Adult Children  Expected Discharge Plan and Services Expected Discharge Plan: [place] In-house Referral: Clinical Social Work   Post Acute Care Choice: [place] arrangements for the past 2 months: [place] Expected Discharge Date: 10/06/20                         HH Arranged: PT [place] Agency: [place] Date [place]: 10/06/20      Prior Living Arrangements/Services Living arrangements for the past 2 months: Single Family Home Lives with:: Adult Children Patient language and need for interpreter reviewed:: Yes Do you feel safe going back to the place where you live?: Yes      Need for Family Participation in Patient Care: Yes (Comment) Care giver support system in place?: Yes (comment)   Criminal Activity/Legal Involvement Pertinent to Current Situation/Hospitalization: No - Comment as needed  Activities of  Daily Living Home Assistive Devices/Equipment: Cane (specify quad or straight) ADL Screening (condition at time of admission) Patient's cognitive ability adequate to safely complete daily activities?: Yes Is the patient deaf or have difficulty hearing?: No Does the patient have difficulty seeing, even when wearing glasses/contacts?: No Does the patient have difficulty concentrating, remembering, or making decisions?: No Patient able to express need for assistance with ADLs?: Yes Does the patient have difficulty dressing or bathing?: No Independently performs ADLs?: Yes (appropriate for developmental age) Does the patient have difficulty walking or climbing stairs?: Yes Weakness of Legs: None Weakness of Arms/Hands: None  Permission Sought/Granted Permission sought to share information with : Chartered certified accountant granted to share information with : Yes, Verbal Permission Granted     Permission granted to share info w AGENCY: [place]        Emotional Assessment       Orientation: : Oriented to Self,Oriented to Place,Oriented to Situation,Oriented to  Time Alcohol / Substance Use: Not Applicable Psych Involvement: No (comment)  Admission diagnosis:  Acute on chronic respiratory failure with hypoxia (HCC) [J96.21] Dyspnea, unspecified type [R06.00] Acute on chronic congestive heart failure, unspecified heart failure type ([place]) [I50.9] Pneumonia due to COVID-19 virus [U07.1, J12.82] COVID-19 [U07.1] Patient Active Problem List   Diagnosis Date Noted  . CKD (chronic kidney disease) stage 3, GFR 30-59 ml/min (HCC) 10/05/2020  . Acute on chronic combined systolic and diastolic CHF (congestive heart failure) ([place]) 10/05/2020  . COPD with acute exacerbation ([place]) 10/05/2020  . Pneumonia due to COVID-19  virus 10/04/2020  . Acid reflux 06/12/2020  . Candidal esophagitis ([place]) 05/21/2020  . Esophageal dysphagia 02/20/2020  . Abdominal pain, epigastric 02/20/2020  .  Abnormal CT of liver 02/20/2020  . AKI (acute kidney injury) ([place]) 09/04/2019  . Acute on chronic systolic CHF (congestive heart failure) ([place]) 09/04/2019  . Trifascicular block 09/04/2019  . Acute on chronic respiratory failure with hypoxia ([place]) 08/31/2019  . Acute on chronic diastolic CHF (congestive heart failure) ([place]) 08/31/2019  . COPD (chronic obstructive pulmonary disease) ([place]) 12/21/2013  . Bronchiectasis ([place]) 12/21/2013  . Dyspnea on exertion 08/23/2012  . HTN (hypertension) 08/23/2012  . Fever 02/11/2012  . Obesity 01/11/2012  . Chest pain 11/29/2011  . Cardiomyopathy, secondary ([place]) 11/29/2011  . Chronic renal insufficiency   . Type 2 diabetes mellitus ([place])    PCP:  Neale Burly, MD Pharmacy:   [facility] [place], [place] [address] [place] [address] Phone: [phone] Fax: [phone]  [place] at [place], [address] [place] [address] Phone: [phone] Fax: [phone]     Social Determinants of Health (SDOH) Interventions    Readmission Risk Interventions Readmission Risk Prevention Plan 10/06/2020  Transportation Screening Complete  PCP or Specialist Appt within 5-7 Days Complete  Home Care Screening Complete  Medication Review (RN CM) Complete  Some recent data might be hidden     Final next level of care: Home w Home Health Services Barriers to Discharge: Barriers Resolved   Patient Goals and CMS Choice Patient states their goals for this hospitalization and ongoing recovery are:: get better CMS Medicare.gov Compare Post Acute Care list provided to:: Patient Represenative (must comment) Choice offered to / list presented to : Adult Children  Discharge Placement                       Discharge Plan and Services In-house Referral: Clinical Social Work   Post Acute Care Choice: Home Health                    HH Arranged:  PT [facility] Agency: [place] Date [place]: 10/06/20      Social Determinants of Health (SDOH) Interventions     Readmission Risk Interventions Readmission Risk Prevention Plan 10/06/2020  Transportation Screening Complete  PCP or Specialist Appt within 5-7 Days Complete  Home Care Screening Complete  Medication Review (RN CM) Complete  Some recent data might be hidden

## 2020-10-06 NOTE — Progress Notes (Signed)
Nsg Discharge Note  Admit Date:  10/04/2020 Discharge date: 10/06/2020   Gregory Mitchell to be D/C'd home per MD order.  AVS completed.  Copy for chart, and copy for patient signed, and dated. Patient/caregiver able to verbalize understanding.  Discharge Medication: Allergies as of 10/06/2020      Reactions   Amoxicillin Other (See Comments)   Makes eyes turn yellow and burn   Ciprofloxacin    Pt not sure   Sulfa Antibiotics Rash   Pt not sure of reaction      Medication List    TAKE these medications   albuterol 108 (90 Base) MCG/ACT inhaler Commonly known as: VENTOLIN HFA Inhale 2 puffs into the lungs every 6 (six) hours as needed for wheezing or shortness of breath.   apixaban 5 MG Tabs tablet Commonly known as: ELIQUIS Take 5 mg by mouth 2 (two) times daily.   carvedilol 12.5 MG tablet Commonly known as: Coreg Take 1 tablet (12.5 mg total) by mouth 2 (two) times daily.   dapagliflozin propanediol 10 MG Tabs tablet Commonly known as: Farxiga Take 1 tablet (10 mg total) by mouth daily before breakfast.   diclofenac Sodium 1 % Gel Commonly known as: VOLTAREN Apply 1 application topically 4 (four) times daily.   Entresto 24-26 MG Generic drug: sacubitril-valsartan Take 1 tablet by mouth 2 (two) times daily.   fluconazole 200 MG tablet Commonly known as: Diflucan Take 1 tablet (200 mg total) by mouth daily for 21 days. Take 2 tablets on day one then 1 tablet daily for a total of 21 days   furosemide 20 MG tablet Commonly known as: LASIX Take 20 mg by mouth 2 (two) times daily.   hydrALAZINE 10 MG tablet Commonly known as: APRESOLINE Take 10 mg by mouth 3 (three) times daily.   HYDROcodone-acetaminophen 7.5-325 MG tablet Commonly known as: NORCO Take 1 tablet by mouth 2 (two) times daily as needed for moderate pain.   ipratropium-albuterol 0.5-2.5 (3) MG/3ML Soln Commonly known as: DUONEB Take 3 mLs by nebulization every 6 (six) hours as needed (for shortness  of breath/wheezing).   metFORMIN 500 MG 24 hr tablet Commonly known as: GLUCOPHAGE-XR Take 1,000 mg by mouth 2 (two) times daily.   mirtazapine 15 MG tablet Commonly known as: REMERON Take 15 mg by mouth daily.   omeprazole 40 MG capsule Commonly known as: PRILOSEC Take 1 capsule (40 mg total) by mouth 2 (two) times daily before a meal. 30 minutes before breakfast and 30 minutes before dinner   OXYGEN Inhale 2 L into the lungs at bedtime as needed (for shortness of breath).   predniSONE 50 MG tablet Commonly known as: DELTASONE Take 1 tablet (50 mg total) by mouth 2 (two) times daily with a meal.   Symbicort 160-4.5 MCG/ACT inhaler Generic drug: budesonide-formoterol Inhale 2 puffs into the lungs in the morning and at bedtime.   tamsulosin 0.4 MG Caps capsule Commonly known as: FLOMAX Take 0.4 mg by mouth daily.       Discharge Assessment: Vitals:   10/06/20 0736 10/06/20 0904  BP:  116/62  Pulse:  64  Resp:    Temp:    SpO2: 99% 98%   Skin clean, dry and intact without evidence of skin break down, no evidence of skin tears noted. IV catheter discontinued intact. Site without signs and symptoms of complications - no redness or edema noted at insertion site, patient denies c/o pain - only slight tenderness at site.  Dressing with  slight pressure applied.  D/c Instructions-Education: Discharge instructions given to patient/family with verbalized understanding. D/c education completed with patient/family including follow up instructions, medication list, d/c activities limitations if indicated, with other d/c instructions as indicated by MD - patient able to verbalize understanding, all questions fully answered. Patient instructed to return to ED, call 911, or call MD for any changes in condition.  Patient escorted via [place], and D/C home via private auto.  Zachery Conch, RN 10/06/2020 2:33 PM

## 2020-10-06 NOTE — Progress Notes (Signed)
Patient c/o dull chest 5/10 to the lower sternal area. Patient states he doesn't get worse when taking a deep breath. Patient also stated he had been coughing but not much. Patient mention it is the same pain as he previous experienced. MD made aware via Amion chat with orders to monitor closely.

## 2020-10-11 ENCOUNTER — Other Ambulatory Visit: Payer: Self-pay | Admitting: Internal Medicine

## 2020-10-13 ENCOUNTER — Inpatient Hospital Stay
Admission: AD | Admit: 2020-10-13 | Payer: Medicare HMO | Source: Other Acute Inpatient Hospital | Admitting: Internal Medicine

## 2020-10-13 NOTE — Progress Notes (Deleted)
Cardiology Office Note  Date: 10/13/2020   ID: Gregory Mitchell, DOB November 28, 1940, MRN [id number]  PCP:  Neale Burly, MD  Cardiologist:  Carlyle Dolly, MD Electrophysiologist:  None   Chief Complaint: ***  History of Present Illness: Gregory Mitchell is a 80 y.o. male with a history of Chronic combined HF, COPD, previous smoker,  HTN, DM2, CKD III, PE, Chronic respiratory failure on home 02 2LNC at night.   Recent presentation 10/04/2020 with SOB worsening over 2 days prior. Having CP daily worse when eating and when waking each day. Had recent admission to [facility] with Covid-19 and CHF 07/2020. He was covid 19 positive. CXR positive for bilateral patchy infiltrates. Treated with IV solumedrol transitioned to po prednisone at d/c, remdesivir. Resumed Lasix, entresto,carvedilol, and farxiga. Continuing eliquis for hx of PE. Treated with fluconazole for oral candida esophagitis.  Needs BMP and CBC  Past Medical History:  Diagnosis Date  . Asthma   . Chronic renal insufficiency   . COPD (chronic obstructive pulmonary disease) ([place])   . Heart failure (HCC)    Acute on chronic combined systolic and diastolic heart failure  . Hypertension   . Pulmonary emboli ([place]) 03/2019  . Renal insufficiency   . Type 2 diabetes mellitus ([place])     Past Surgical History:  Procedure Laterality Date  . APPENDECTOMY    . BALLOON DILATION N/A 03/18/2020   Procedure: BALLOON DILATION;  Surgeon: Eloise Harman, DO;  Location: [facility] ENDO SUITE;  Service: Endoscopy;  Laterality: N/A;  . CHOLECYSTECTOMY    . COLONOSCOPY  10/2006   Dr.Anwar:normal  . ESOPHAGOGASTRODUODENOSCOPY (EGD) WITH PROPOFOL N/A 03/18/2020   Procedure: ESOPHAGOGASTRODUODENOSCOPY (EGD) WITH PROPOFOL;  Surgeon: Eloise Harman, DO;  Location: [facility] ENDO SUITE;  Service: Endoscopy;  Laterality: N/A;  2:30pm  . NASAL SINUS SURGERY    . NEPHRECTOMY     RIGHT (Question CA.  No further therapy needed.)  . RIGHT/LEFT HEART CATH AND CORONARY  ANGIOGRAPHY N/A 09/03/2019   Procedure: RIGHT/LEFT HEART CATH AND CORONARY ANGIOGRAPHY;  Surgeon: Lorretta Harp, MD;  Location: [facility] CV LAB;  Service: Cardiovascular;  Laterality: N/A;    Current Outpatient Medications  Medication Sig Dispense Refill  . albuterol (VENTOLIN HFA) 108 (90 Base) MCG/ACT inhaler Inhale 2 puffs into the lungs every 6 (six) hours as needed for wheezing or shortness of breath.     [name] apixaban (ELIQUIS) 5 MG TABS tablet Take 5 mg by mouth 2 (two) times daily.    . carvedilol (COREG) 12.5 MG tablet Take 1 tablet (12.5 mg total) by mouth 2 (two) times daily. 60 tablet 11  . dapagliflozin propanediol (FARXIGA) 10 MG TABS tablet Take 1 tablet (10 mg total) by mouth daily before breakfast. 30 tablet 11  . diclofenac Sodium (VOLTAREN) 1 % GEL Apply 1 application topically 4 (four) times daily.    . furosemide (LASIX) 20 MG tablet Take 20 mg by mouth 2 (two) times daily.    . hydrALAZINE (APRESOLINE) 10 MG tablet Take 10 mg by mouth 3 (three) times daily.    [name] HYDROcodone-acetaminophen (NORCO) 7.5-325 MG tablet Take 1 tablet by mouth 2 (two) times daily as needed for moderate pain.    [name] ipratropium-albuterol (DUONEB) 0.5-2.5 (3) MG/3ML SOLN Take 3 mLs by nebulization every 6 (six) hours as needed (for shortness of breath/wheezing).     . metFORMIN (GLUCOPHAGE-XR) 500 MG 24 hr tablet Take 1,000 mg by mouth 2 (two) times daily.    [name]  mirtazapine (REMERON) 15 MG tablet Take 15 mg by mouth daily.    [name] omeprazole (PRILOSEC) 40 MG capsule Take 1 capsule (40 mg total) by mouth 2 (two) times daily before a meal. 30 minutes before breakfast and 30 minutes before dinner 60 capsule 5  . OXYGEN Inhale 2 L into the lungs at bedtime as needed (for shortness of breath).    . predniSONE (DELTASONE) 50 MG tablet Take 1 tablet (50 mg total) by mouth 2 (two) times daily with a meal. 14 tablet 0  . sacubitril-valsartan (ENTRESTO) 24-26 MG Take 1 tablet by mouth 2 (two) times daily. 60  tablet 11  . SYMBICORT 160-4.5 MCG/ACT inhaler Inhale 2 puffs into the lungs in the morning and at bedtime.    . tamsulosin (FLOMAX) 0.4 MG CAPS capsule Take 0.4 mg by mouth daily.     No current facility-administered medications for this visit.   Allergies:  Amoxicillin, Ciprofloxacin, and Sulfa antibiotics   Social History: The patient  reports that he quit smoking about 44 years ago. His smoking use included cigarettes. He started smoking about 63 years ago. He has a 15.00 pack-year smoking history. He quit smokeless tobacco use about 14 years ago.  His smokeless tobacco use included chew. He reports previous alcohol use. He reports that he does not use drugs.   Family History: The patient's family history includes Breast cancer in his mother; Lung cancer in his brother; Stroke in his mother.   ROS:  Please see the history of present illness. Otherwise, complete review of systems is positive for {NONE DEFAULTED:18576::"none"}.  All other systems are reviewed and negative.   Physical Exam: VS:  There were no vitals taken for this visit., BMI There is no height or weight on file to calculate BMI.  Wt Readings from Last 3 Encounters:  10/06/20 132 lb 15 oz (60.3 kg)  09/17/20 153 lb (69.4 kg)  09/04/20 143 lb (64.9 kg)    General: Patient appears comfortable at rest. HEENT: Conjunctiva and lids normal, oropharynx clear with moist mucosa. Neck: Supple, no elevated JVP or carotid bruits, no thyromegaly. Lungs: Clear to auscultation, nonlabored breathing at rest. Cardiac: Regular rate and rhythm, no S3 or significant systolic murmur, no pericardial rub. Abdomen: Soft, nontender, no hepatomegaly, bowel sounds present, no guarding or rebound. Extremities: No pitting edema, distal pulses 2+. Skin: Warm and dry. Musculoskeletal: No kyphosis. Neuropsychiatric: Alert and oriented x3, affect grossly appropriate.  ECG:  {EKG/Telemetry Strips [phone]}  Recent Labwork: 10/04/2020: B  Natriuretic Peptide 1,309.0 10/06/2020: ALT 10; AST 12; BUN 29; Creatinine, Ser 1.31; Hemoglobin 10.7; Magnesium 2.6; Platelets 428; Potassium 4.5; Sodium 137  No results found for: CHOL, TRIG, HDL, CHOLHDL, VLDL, LDLCALC, LDLDIRECT  Other Studies Reviewed Today:   Echocardiogram 10/05/2020  1. Left ventricular ejection fraction, by estimation, is 20 to 25%. The left ventricle has severely decreased function. The left ventricle demonstrates global hypokinesis. The left ventricular internal cavity size was severely dilated. Left ventricular diastolic parameters are consistent with Grade III diastolic dysfunction (restrictive). 2. Right ventricular systolic function is normal. The right ventricular size is normal. There is normal pulmonary artery systolic pressure. 3. Left atrial size was mildly dilated. 4. The mitral valve is normal in structure. Trivial mitral valve regurgitation. No evidence of mitral stenosis. 5. The aortic valve is tricuspid. Aortic valve regurgitation is not visualized. No aortic stenosis is present. 6. The inferior vena cava is normal in size with greater than 50% respiratory variability, suggesting right atrial pressure  of 3 mmHg.  Assessment and Plan:  No diagnosis found.   Medication Adjustments/Labs and Tests Ordered: Current medicines are reviewed at length with the patient today.  Concerns regarding medicines are outlined above.   Disposition: Follow-up with ***  Signed, Levell July, NP 10/13/2020 9:07 PM    [facility] at [facility] [place], [place], [place] [address] Phone: [phone]; Fax: [phone]

## 2020-10-14 ENCOUNTER — Inpatient Hospital Stay (HOSPITAL_COMMUNITY)
Admission: AD | Admit: 2020-10-14 | Discharge: 2020-10-16 | DRG: 193 | Disposition: A | Discharge status: Home Health | Payer: Medicare HMO | Source: Other Acute Inpatient Hospital | Attending: Internal Medicine | Admitting: Internal Medicine

## 2020-10-14 ENCOUNTER — Ambulatory Visit: Payer: Medicare HMO | Admitting: Family Medicine

## 2020-10-14 ENCOUNTER — Inpatient Hospital Stay (HOSPITAL_COMMUNITY): Payer: Medicare HMO

## 2020-10-14 ENCOUNTER — Encounter (HOSPITAL_COMMUNITY): Payer: Self-pay | Admitting: Family Medicine

## 2020-10-14 DIAGNOSIS — N179 Acute kidney failure, unspecified: Secondary | ICD-10-CM | POA: Diagnosis present

## 2020-10-14 DIAGNOSIS — N400 Enlarged prostate without lower urinary tract symptoms: Secondary | ICD-10-CM | POA: Diagnosis present

## 2020-10-14 DIAGNOSIS — R0602 Shortness of breath: Secondary | ICD-10-CM

## 2020-10-14 DIAGNOSIS — J9621 Acute and chronic respiratory failure with hypoxia: Secondary | ICD-10-CM | POA: Diagnosis present

## 2020-10-14 DIAGNOSIS — T380X5A Adverse effect of glucocorticoids and synthetic analogues, initial encounter: Secondary | ICD-10-CM | POA: Diagnosis present

## 2020-10-14 DIAGNOSIS — Z86711 Personal history of pulmonary embolism: Secondary | ICD-10-CM | POA: Diagnosis present

## 2020-10-14 DIAGNOSIS — R079 Chest pain, unspecified: Secondary | ICD-10-CM | POA: Diagnosis present

## 2020-10-14 DIAGNOSIS — I5042 Chronic combined systolic (congestive) and diastolic (congestive) heart failure: Secondary | ICD-10-CM | POA: Diagnosis present

## 2020-10-14 DIAGNOSIS — R0789 Other chest pain: Secondary | ICD-10-CM

## 2020-10-14 DIAGNOSIS — I248 Other forms of acute ischemic heart disease: Secondary | ICD-10-CM | POA: Diagnosis present

## 2020-10-14 DIAGNOSIS — J159 Unspecified bacterial pneumonia: Secondary | ICD-10-CM | POA: Diagnosis present

## 2020-10-14 DIAGNOSIS — E1122 Type 2 diabetes mellitus with diabetic chronic kidney disease: Secondary | ICD-10-CM | POA: Diagnosis present

## 2020-10-14 DIAGNOSIS — Z9981 Dependence on supplemental oxygen: Secondary | ICD-10-CM

## 2020-10-14 DIAGNOSIS — L8990 Pressure ulcer of unspecified site, unspecified stage: Secondary | ICD-10-CM | POA: Insufficient documentation

## 2020-10-14 DIAGNOSIS — J440 Chronic obstructive pulmonary disease with acute lower respiratory infection: Secondary | ICD-10-CM | POA: Diagnosis present

## 2020-10-14 DIAGNOSIS — L89152 Pressure ulcer of sacral region, stage 2: Secondary | ICD-10-CM | POA: Diagnosis present

## 2020-10-14 DIAGNOSIS — Z9049 Acquired absence of other specified parts of digestive tract: Secondary | ICD-10-CM | POA: Diagnosis not present

## 2020-10-14 DIAGNOSIS — R778 Other specified abnormalities of plasma proteins: Secondary | ICD-10-CM

## 2020-10-14 DIAGNOSIS — E875 Hyperkalemia: Secondary | ICD-10-CM | POA: Diagnosis present

## 2020-10-14 DIAGNOSIS — J439 Emphysema, unspecified: Secondary | ICD-10-CM

## 2020-10-14 DIAGNOSIS — J449 Chronic obstructive pulmonary disease, unspecified: Secondary | ICD-10-CM | POA: Diagnosis present

## 2020-10-14 DIAGNOSIS — I130 Hypertensive heart and chronic kidney disease with heart failure and stage 1 through stage 4 chronic kidney disease, or unspecified chronic kidney disease: Secondary | ICD-10-CM | POA: Diagnosis present

## 2020-10-14 DIAGNOSIS — Z882 Allergy status to sulfonamides status: Secondary | ICD-10-CM

## 2020-10-14 DIAGNOSIS — U071 COVID-19: Secondary | ICD-10-CM | POA: Diagnosis present

## 2020-10-14 DIAGNOSIS — Z79899 Other long term (current) drug therapy: Secondary | ICD-10-CM | POA: Diagnosis not present

## 2020-10-14 DIAGNOSIS — J189 Pneumonia, unspecified organism: Secondary | ICD-10-CM | POA: Diagnosis present

## 2020-10-14 DIAGNOSIS — N1831 Chronic kidney disease, stage 3a: Secondary | ICD-10-CM | POA: Diagnosis present

## 2020-10-14 DIAGNOSIS — Z881 Allergy status to other antibiotic agents status: Secondary | ICD-10-CM

## 2020-10-14 LAB — COMPREHENSIVE METABOLIC PANEL
ALT: 31 U/L (ref 0–44)
AST: 22 U/L (ref 15–41)
Albumin: 2.1 g/dL — ABNORMAL LOW (ref 3.5–5.0)
Alkaline Phosphatase: 37 U/L — ABNORMAL LOW (ref 38–126)
Anion gap: 7 (ref 5–15)
BUN: 39 mg/dL — ABNORMAL HIGH (ref 8–23)
CO2: 32 mmol/L (ref 22–32)
Calcium: 8.7 mg/dL — ABNORMAL LOW (ref 8.9–10.3)
Chloride: 99 mmol/L (ref 98–111)
Creatinine, Ser: 1.49 mg/dL — ABNORMAL HIGH (ref 0.61–1.24)
GFR, Estimated: 47 mL/min — ABNORMAL LOW (ref >60)
Glucose, Bld: 244 mg/dL — ABNORMAL HIGH (ref 70–99)
Potassium: 5.3 mmol/L — ABNORMAL HIGH (ref 3.5–5.1)
Sodium: 138 mmol/L (ref 135–145)
Total Bilirubin: 1 mg/dL (ref 0.3–1.2)
Total Protein: 5.4 g/dL — ABNORMAL LOW (ref 6.5–8.1)

## 2020-10-14 LAB — CBC WITH DIFFERENTIAL/PLATELET
Abs Immature Granulocytes: 0.07 10*3/uL (ref 0.00–0.07)
Basophils Absolute: 0 10*3/uL (ref 0.0–0.1)
Basophils Relative: 0 %
Eosinophils Absolute: 0 10*3/uL (ref 0.0–0.5)
Eosinophils Relative: 0 %
HCT: 37.2 % — ABNORMAL LOW (ref 39.0–52.0)
Hemoglobin: 11.1 g/dL — ABNORMAL LOW (ref 13.0–17.0)
Immature Granulocytes: 0 %
Lymphocytes Relative: 3 %
Lymphs Abs: 0.6 10*3/uL — ABNORMAL LOW (ref 0.7–4.0)
MCH: 26.9 pg (ref 26.0–34.0)
MCHC: 29.8 g/dL — ABNORMAL LOW (ref 30.0–36.0)
MCV: 90.3 fL (ref 80.0–100.0)
Monocytes Absolute: 1 10*3/uL (ref 0.1–1.0)
Monocytes Relative: 6 %
Neutro Abs: 15.4 10*3/uL — ABNORMAL HIGH (ref 1.7–7.7)
Neutrophils Relative %: 91 %
Platelets: 302 10*3/uL (ref 150–400)
RBC: 4.12 MIL/uL — ABNORMAL LOW (ref 4.22–5.81)
RDW: 15.4 % (ref 11.5–15.5)
WBC: 17 10*3/uL — ABNORMAL HIGH (ref 4.0–10.5)
nRBC: 0 % (ref 0.0–0.2)

## 2020-10-14 LAB — GLUCOSE, CAPILLARY
Glucose-Capillary: 157 mg/dL — ABNORMAL HIGH (ref 70–99)
Glucose-Capillary: 182 mg/dL — ABNORMAL HIGH (ref 70–99)
Glucose-Capillary: 215 mg/dL — ABNORMAL HIGH (ref 70–99)
Glucose-Capillary: 229 mg/dL — ABNORMAL HIGH (ref 70–99)
Glucose-Capillary: 281 mg/dL — ABNORMAL HIGH (ref 70–99)

## 2020-10-14 LAB — HEMOGLOBIN A1C
Hgb A1c MFr Bld: 7.2 % — ABNORMAL HIGH (ref 4.8–5.6)
Mean Plasma Glucose: 159.94 mg/dL

## 2020-10-14 LAB — TROPONIN I (HIGH SENSITIVITY): Troponin I (High Sensitivity): 187 ng/L (ref <18)

## 2020-10-14 LAB — MRSA PCR SCREENING: MRSA by PCR: NEGATIVE

## 2020-10-14 LAB — MAGNESIUM: Magnesium: 2.4 mg/dL (ref 1.7–2.4)

## 2020-10-14 LAB — PROCALCITONIN: Procalcitonin: 2.55 ng/mL

## 2020-10-14 MED ORDER — METHYLPREDNISOLONE SODIUM SUCC 40 MG IJ SOLR
40.0000 mg | Freq: Two times a day (BID) | INTRAMUSCULAR | Status: DC
  Administered 2020-10-14 – 2020-10-16 (×5): 40 mg via INTRAVENOUS
  Filled 2020-10-14 (×5): qty 1

## 2020-10-14 MED ORDER — INSULIN ASPART 100 UNIT/ML ~~LOC~~ SOLN
0.0000 [IU] | Freq: Three times a day (TID) | SUBCUTANEOUS | Status: DC
  Administered 2020-10-14: 2 [IU] via SUBCUTANEOUS
  Administered 2020-10-14 (×2): 1 [IU] via SUBCUTANEOUS
  Administered 2020-10-15: 3 [IU] via SUBCUTANEOUS

## 2020-10-14 MED ORDER — HYDROCODONE-ACETAMINOPHEN 7.5-325 MG PO TABS
1.0000 | ORAL_TABLET | Freq: Four times a day (QID) | ORAL | Status: DC | PRN
Start: 2020-10-14 — End: 2020-10-16
  Administered 2020-10-14 (×2): 1 via ORAL
  Filled 2020-10-14 (×2): qty 1

## 2020-10-14 MED ORDER — CARVEDILOL 12.5 MG PO TABS
12.5000 mg | ORAL_TABLET | Freq: Two times a day (BID) | ORAL | Status: DC
  Administered 2020-10-14 – 2020-10-16 (×3): 12.5 mg via ORAL
  Filled 2020-10-14 (×6): qty 1

## 2020-10-14 MED ORDER — HYDROCOD POLST-CPM POLST ER 10-8 MG/5ML PO SUER
5.0000 mL | Freq: Two times a day (BID) | ORAL | Status: DC | PRN
Start: 2020-10-14 — End: 2020-10-16

## 2020-10-14 MED ORDER — SODIUM CHLORIDE 0.9 % IV SOLN
2.0000 g | INTRAVENOUS | Status: DC
  Administered 2020-10-14 – 2020-10-16 (×3): 2 g via INTRAVENOUS
  Filled 2020-10-14 (×3): qty 20

## 2020-10-14 MED ORDER — PANTOPRAZOLE SODIUM 40 MG PO TBEC
40.0000 mg | DELAYED_RELEASE_TABLET | Freq: Every day | ORAL | Status: DC
  Administered 2020-10-14 – 2020-10-16 (×3): 40 mg via ORAL
  Filled 2020-10-14 (×3): qty 1

## 2020-10-14 MED ORDER — ONDANSETRON HCL 4 MG/2ML IJ SOLN: 4.0000 mg | Freq: Four times a day (QID) | INTRAMUSCULAR | Status: DC | PRN

## 2020-10-14 MED ORDER — MOMETASONE FURO-FORMOTEROL FUM 200-5 MCG/ACT IN AERO
2.0000 | INHALATION_SPRAY | Freq: Two times a day (BID) | RESPIRATORY_TRACT | Status: DC
  Administered 2020-10-14 – 2020-10-16 (×5): 2 via RESPIRATORY_TRACT
  Filled 2020-10-14: qty 8.8

## 2020-10-14 MED ORDER — SODIUM ZIRCONIUM CYCLOSILICATE 10 G PO PACK
10.0000 g | PACK | Freq: Two times a day (BID) | ORAL | Status: AC
  Administered 2020-10-14 (×2): 10 g via ORAL
  Filled 2020-10-14 (×2): qty 1

## 2020-10-14 MED ORDER — ACETAMINOPHEN 650 MG RE SUPP: 650.0000 mg | Freq: Four times a day (QID) | RECTAL | Status: DC | PRN

## 2020-10-14 MED ORDER — LEVALBUTEROL TARTRATE 45 MCG/ACT IN AERO
2.0000 | INHALATION_SPRAY | Freq: Three times a day (TID) | RESPIRATORY_TRACT | Status: DC
  Administered 2020-10-14 – 2020-10-16 (×7): 2 via RESPIRATORY_TRACT
  Filled 2020-10-14: qty 15

## 2020-10-14 MED ORDER — CHLORHEXIDINE GLUCONATE CLOTH 2 % EX PADS
6.0000 | MEDICATED_PAD | Freq: Every day | CUTANEOUS | Status: DC
  Administered 2020-10-14 – 2020-10-15 (×2): 6 via TOPICAL

## 2020-10-14 MED ORDER — IPRATROPIUM-ALBUTEROL 20-100 MCG/ACT IN AERS
1.0000 | INHALATION_SPRAY | Freq: Four times a day (QID) | RESPIRATORY_TRACT | Status: DC | PRN
  Filled 2020-10-14: qty 4

## 2020-10-14 MED ORDER — DAPAGLIFLOZIN PROPANEDIOL 10 MG PO TABS
10.0000 mg | ORAL_TABLET | Freq: Every day | ORAL | Status: DC
  Administered 2020-10-14 – 2020-10-15 (×2): 10 mg via ORAL
  Filled 2020-10-14 (×2): qty 1

## 2020-10-14 MED ORDER — SACUBITRIL-VALSARTAN 24-26 MG PO TABS
1.0000 | ORAL_TABLET | Freq: Two times a day (BID) | ORAL | Status: DC
  Administered 2020-10-14 – 2020-10-16 (×5): 1 via ORAL
  Filled 2020-10-14 (×6): qty 1

## 2020-10-14 MED ORDER — ALUM & MAG HYDROXIDE-SIMETH 200-200-20 MG/5ML PO SUSP: 15.0000 mL | Freq: Three times a day (TID) | ORAL | Status: DC | PRN

## 2020-10-14 MED ORDER — ACETAMINOPHEN 325 MG PO TABS: 650.0000 mg | ORAL_TABLET | Freq: Four times a day (QID) | ORAL | Status: DC | PRN

## 2020-10-14 MED ORDER — ONDANSETRON HCL 4 MG PO TABS: 4.0000 mg | ORAL_TABLET | Freq: Four times a day (QID) | ORAL | Status: DC | PRN

## 2020-10-14 MED ORDER — DICLOFENAC SODIUM 1 % EX GEL
1.0000 "application " | Freq: Four times a day (QID) | CUTANEOUS | Status: DC
  Administered 2020-10-14 – 2020-10-16 (×10): 1 via TOPICAL
  Filled 2020-10-14: qty 100

## 2020-10-14 MED ORDER — MIRTAZAPINE 15 MG PO TABS
15.0000 mg | ORAL_TABLET | Freq: Every day | ORAL | Status: DC
  Administered 2020-10-14 – 2020-10-16 (×3): 15 mg via ORAL
  Filled 2020-10-14 (×3): qty 1

## 2020-10-14 MED ORDER — FUROSEMIDE 20 MG PO TABS
20.0000 mg | ORAL_TABLET | Freq: Two times a day (BID) | ORAL | Status: DC
  Administered 2020-10-14 – 2020-10-16 (×5): 20 mg via ORAL
  Filled 2020-10-14 (×5): qty 1

## 2020-10-14 MED ORDER — SENNOSIDES-DOCUSATE SODIUM 8.6-50 MG PO TABS
1.0000 | ORAL_TABLET | Freq: Every evening | ORAL | Status: DC | PRN
  Administered 2020-10-15: 1 via ORAL
  Filled 2020-10-14: qty 1

## 2020-10-14 MED ORDER — APIXABAN 5 MG PO TABS
5.0000 mg | ORAL_TABLET | Freq: Two times a day (BID) | ORAL | Status: DC
  Administered 2020-10-14 – 2020-10-16 (×6): 5 mg via ORAL
  Filled 2020-10-14 (×6): qty 1

## 2020-10-14 MED ORDER — SODIUM CHLORIDE 0.9 % IV SOLN
500.0000 mg | INTRAVENOUS | Status: DC
  Administered 2020-10-14 – 2020-10-16 (×3): 500 mg via INTRAVENOUS
  Filled 2020-10-14 (×4): qty 500

## 2020-10-14 MED ORDER — ALUM & MAG HYDROXIDE-SIMETH 200-200-20 MG/5ML PO SUSP
15.0000 mL | Freq: Three times a day (TID) | ORAL | Status: DC | PRN
  Administered 2020-10-14: 15 mL via ORAL
  Filled 2020-10-14: qty 30

## 2020-10-14 MED ORDER — SACUBITRIL-VALSARTAN 24-26 MG PO TABS: 1.0000 | ORAL_TABLET | Freq: Two times a day (BID) | ORAL | Status: DC

## 2020-10-14 MED ORDER — HYDROCODONE-ACETAMINOPHEN 7.5-325 MG PO TABS
1.0000 | ORAL_TABLET | Freq: Two times a day (BID) | ORAL | Status: DC | PRN
  Administered 2020-10-14: 1 via ORAL
  Filled 2020-10-14: qty 1

## 2020-10-14 MED ORDER — GUAIFENESIN-DM 100-10 MG/5ML PO SYRP: 10.0000 mL | ORAL_SOLUTION | ORAL | Status: DC | PRN

## 2020-10-14 MED ORDER — TAMSULOSIN HCL 0.4 MG PO CAPS
0.4000 mg | ORAL_CAPSULE | Freq: Every day | ORAL | Status: DC
  Administered 2020-10-14 – 2020-10-16 (×3): 0.4 mg via ORAL
  Filled 2020-10-14 (×3): qty 1

## 2020-10-14 NOTE — Progress Notes (Signed)
I was looking for pt's dentures in his belongings and noticed that patient had 2 wallets, cash, pack of chewing tobacco, cell phone, inhaler, clothing, slippers.  I called charge nurse Rudene Anda) and nursing supervisor ( Delcine) to witness me counting cash.  This is what was counted and sent to safe - 2 wallets, cash ($1100 - $100 bills) ($100 - $20 bills) ($28 - $1 bills) , pack of chewing tobacco, inhaler.  Pt states he had upper and lower dentures but only upper dentures found.  Pt transferred from [facility].

## 2020-10-14 NOTE — Progress Notes (Signed)
Inpatient Diabetes Program Recommendations  AACE/ADA: New Consensus Statement on Inpatient Glycemic Control (2015)  Target Ranges:  Prepandial:   less than 140 mg/dL      Peak postprandial:   less than 180 mg/dL (1-2 hours)      Critically ill patients:  140 - 180 mg/dL   Lab Results  Component Value Date   GLUCAP 182 (H) 10/14/2020   HGBA1C 7.2 (H) 10/14/2020    Review of Glycemic Control Results for Gregory Mitchell, Gregory Mitchell (MRN [id number]) as of 10/14/2020 11:14  Ref. Range 10/14/2020 00:41 10/14/2020 07:28  Glucose-Capillary Latest Ref Range: 70 - 99 mg/dL 215 (H) 182 (H)   Diabetes history: DM 2 Outpatient Diabetes medications: Farxiga 10 mg Daily, Metformin 1000 mg bid Current orders for Inpatient glycemic control:  Farxiga 10 mg Daily Novolog 0-6 units tid  Solumedrol 40 mg Q12 Creat/BUN: 1.49/39  Inpatient Diabetes Program Recommendations:    -  Start Levemir 8-10 units -  Add Novolog hs scale  Thanks,  Tama Headings RN, MSN, BC-ADM Inpatient Diabetes Coordinator Team Pager [phone] (8a-5p)

## 2020-10-14 NOTE — Progress Notes (Signed)
Patient received to the unit. Patient is alert and oriented x3. Iv in place. Vital signs are stable. Skin assessment done with another nurse. Patient is on 02 3 l [place]. Given instructions about call bell and phone. Bed in low position and call bell in reach.

## 2020-10-14 NOTE — TOC Initial Note (Addendum)
Transition of Care [facility]) - Initial/Assessment Note    Patient Details  Name: Gregory Mitchell MRN: [id number] Date of Birth: Dec 19, 1940  Transition of Care [facility]) CM/SW Contact:    Carles Collet, RN Phone Number: 10/14/2020, 12:10 PM  Clinical Narrative:        LVM w patient's daughter requesting callback. Will verify resources and home health established at last discharge when she calls back.  Records indicate patient is from home with daughter, uses oxygen at 2L at home, and was referred to [facility] when discharged from [facility] 2/28.       Spoke w [employer] and they state that the patient declined [facility] services stating he was back to baseline, and they did not open his case.         Expected Discharge Plan: [place] Barriers to Discharge: Continued Medical Work up   Patient Goals and CMS Choice        Expected Discharge Plan and Services Expected Discharge Plan: [place]                                              Prior Living Arrangements/Services                       Activities of Daily Living      Permission Sought/Granted                  Emotional Assessment              Admission diagnosis:  Left lower lobe pneumonia [J18.9] Patient Active Problem List   Diagnosis Date Noted  . Bacterial pneumonia 10/14/2020  . History of pulmonary embolism 10/14/2020  . Elevated troponin 10/14/2020  . Left lower lobe pneumonia 10/14/2020  . Pressure injury of skin 10/14/2020  . Chronic kidney disease, stage 3a ([place]) 10/05/2020  . Chronic combined systolic and diastolic CHF (congestive heart failure) ([place]) 10/05/2020  . COPD with acute exacerbation ([place]) 10/05/2020  . COVID-19 virus infection 10/04/2020  . Acid reflux 06/12/2020  . Candidal esophagitis ([place]) 05/21/2020  . Esophageal dysphagia 02/20/2020  . Abdominal pain, epigastric 02/20/2020  . Abnormal CT of liver 02/20/2020  . AKI  (acute kidney injury) ([place]) 09/04/2019  . Acute on chronic systolic CHF (congestive heart failure) ([place]) 09/04/2019  . Trifascicular block 09/04/2019  . Acute on chronic respiratory failure with hypoxia ([place]) 08/31/2019  . Acute on chronic diastolic CHF (congestive heart failure) ([place]) 08/31/2019  . COPD (chronic obstructive pulmonary disease) ([place]) 12/21/2013  . Bronchiectasis ([place]) 12/21/2013  . Dyspnea on exertion 08/23/2012  . HTN (hypertension) 08/23/2012  . Fever 02/11/2012  . Obesity 01/11/2012  . Chest pain 11/29/2011  . Cardiomyopathy, secondary ([place]) 11/29/2011  . Chronic renal insufficiency   . Type 2 diabetes mellitus ([place])    PCP:  Neale Burly, MD Pharmacy:   [facility], [place] [address] [place] [address] Phone: [phone] Fax: [phone]  [facility] at [place], [address] [place] [address] Phone: [phone] Fax: [phone]     Social Determinants of Health (SDOH) Interventions    Readmission Risk Interventions Readmission Risk Prevention Plan 10/06/2020  Transportation Screening Complete  PCP  or Specialist Appt within 5-7 Days Complete  Home Care Screening Complete  Medication Review (RN CM) Complete  Some recent data might be hidden

## 2020-10-14 NOTE — Progress Notes (Signed)
PROGRESS NOTE                                                                                                                                                                                                             Patient Demographics:    Gregory Mitchell, is a 80 y.o. male, DOB - 08-May-1941, IOX:[id number]  Outpatient Primary MD for the patient is Neale Burly, MD   Admit date - 10/14/2020   LOS - 0  No chief complaint on file.      Brief Narrative: Patient is a 80 y.o. male with PMHx of COPD on 2 L of oxygen at home, CKD stage IIIa, PE on Eliquis, chronic combined systolic and diastolic heart failure, [id number] hospitalized at [facility] from 2/26-2/28 for COVID-19 pneumonia-presenting with worsening shortness of breath thought to be due to superimposed bacterial pneumonia.  See below for further details   COVID-19 vaccinated status:   Significant Events: 2/26-2/28>> hospitalized at [facility] for acute on chronic hypoxic resp failure due to COVID-19 pneumonia 3/7>> transfer from [facility]-worsening shortness of breath-severely hypoxic requiring BiPAP-thought to have bacterial pneumonia.    Significant studies: 3/8>> chest x-ray: Ill-defined opacity in bilateral lung bases  COVID-19 medications: None  Antibiotics: Rocephin: 3/7>> Zithromax: 3/7>>  Microbiology data: None  Procedures: 09/03/2019>> LHC: Normal coronaries 2/27//2022>> Echo: EF 97-98%, grade 3 diastolic dysfunction  Consults: None  DVT prophylaxis: apixaban (ELIQUIS) tablet 5 mg     Subjective:    Gregory Mitchell today appears comfortable-on 2 L of oxygen.  Complains of some bilateral chest pain at times.   Assessment  & Plan :   Acute on chronic hypoxic respiratory failure (on 2 L at home) due to bacterial pneumonia+/-lingering COVID-19 pneumonitis: Appears comfortable-initially on presentation to ED at [facility] on BiPAP-currently  stable on just 2-3 L of oxygen.  Continue IV antibiotics-steroids-and follow clinical course.  Fever: afebrile O2 requirements:  SpO2: 96 % O2 Flow Rate (L/min): 3 L/min   COVID-19 Labs: No results for input(s): DDIMER, FERRITIN, LDH, CRP in the last 72 hours.     Component Value Date/Time   BNP 1,309.0 (H) 10/04/2020 1602    Recent Labs  Lab 10/14/20 0134  PROCALCITON 2.55    Lab Results  Component Value Date   SARSCOV2NAA POSITIVE (A) 10/04/2020   [place] NEGATIVE 07/06/2020  [place] NEGATIVE 03/17/2020   [place] NEGATIVE 08/31/2019     Prone/Incentive Spirometry: encouraged  incentive spirometry use 3-4/hour.  COPD exacerbation: Improved-continue bronchodilators-taper steroids rapidly over the next few days.  Chronic combined systolic and diastolic heart failure: Volume status stable-continue Entresto/Coreg/Lasix and [place].  AKI on CKD stage IIIa: Slightly worse than usual baseline-follow for now.  Hyperkalemia: Lokelma x2 doses-repeat electrolytes tomorrow  DM-2 (A1c 7.2 on 3/8): CBG stable with SSI and Farxiga  Recent Labs    10/14/20 0041 10/14/20 0728  GLUCAP 215* 182*    History of PE: Continue Eliquis  Minimally elevated troponin: Secondary demand ischemia-not consistent with ACS.  Bilateral chest pain: Atypical-probably related to musculoskeletal pain in the setting of coughing-LHC on January 2021 was negative, already on full dose anticoagulation-very low suspicion for PE.  Per prior documentation-patient has chronic chest pain as well-continue supportive care.  BPH: Continue Flomax  Chronic debility/deconditioning: Obtain PT/OT eval  RN pressure injury documentation: Pressure Injury 10/14/20 Sacrum Medial Stage 2 -  Partial thickness loss of dermis presenting as a shallow open injury with a red, pink wound bed without slough. 2x2 cm (Active)  10/14/20 0045  Location: Sacrum  Location Orientation: Medial  Staging: Stage 2 -   Partial thickness loss of dermis presenting as a shallow open injury with a red, pink wound bed without slough.  Wound Description (Comments): 2x2 cm  Present on Admission: Yes    GI prophylaxis: PPI  ABG:    Component Value Date/Time   PHART 7.379 09/03/2019 0928   PCO2ART 46.7 09/03/2019 0928   PO2ART 94.0 09/03/2019 0928   HCO3 27.6 09/03/2019 0928   TCO2 29 09/03/2019 0928   O2SAT 97.0 09/03/2019 0928    Vent Settings: N/A    Condition -Guarded  Family Communication  : Daughter-Rachel-[phone] updated over the phone on 3/8  Code Status :  Full Code  Diet :  Diet Order            Diet heart healthy/carb modified Room service appropriate? Yes; Fluid consistency: Thin  Diet effective now                  Disposition Plan  :   Status is: Inpatient  Remains inpatient appropriate because:Inpatient level of care appropriate due to severity of illness   Dispo: The patient is from: Home              Anticipated d/c is to: Home              Patient currently is not medically stable to d/c.   Difficult to place patient No    Barriers to discharge: Hypoxia requiring O2 supplementation/on IV Abx  Antimicorbials  :    Anti-infectives (From admission, onward)   Start     Dose/Rate Route Frequency Ordered Stop   10/14/20 1300  cefTRIAXone (ROCEPHIN) 2 g in sodium chloride 0.9 % 100 mL IVPB        2 g 200 mL/hr over 30 Minutes Intravenous Every 24 hours 10/14/20 0113 10/18/20 1259   10/14/20 1300  azithromycin (ZITHROMAX) 500 mg in sodium chloride 0.9 % 250 mL IVPB        500 mg 250 mL/hr over 60 Minutes Intravenous Every 24 hours 10/14/20 0113 10/18/20 1259      Inpatient Medications  Scheduled Meds: . apixaban  5 mg Oral BID  . carvedilol  12.5 mg Oral BID  . dapagliflozin propanediol  10 mg Oral QAC breakfast  . diclofenac  Sodium  1 application Topical QID  . furosemide  20 mg Oral BID  . insulin aspart  0-6 Units Subcutaneous TID WC  . mirtazapine   15 mg Oral Daily  . mometasone-formoterol  2 puff Inhalation BID  . pantoprazole  40 mg Oral Daily  . sacubitril-valsartan  1 tablet Oral BID  . sodium zirconium cyclosilicate  10 g Oral BID  . tamsulosin  0.4 mg Oral Daily   Continuous Infusions: . azithromycin    . cefTRIAXone (ROCEPHIN)  IV     PRN Meds:.acetaminophen **OR** acetaminophen, alum & mag hydroxide-simeth, chlorpheniramine-HYDROcodone, guaiFENesin-dextromethorphan, HYDROcodone-acetaminophen, Ipratropium-Albuterol, ondansetron **OR** ondansetron (ZOFRAN) IV, senna-docusate   Time Spent in minutes  35   See all Orders from today for further details   Oren Binet M.D on 10/14/2020 at 10:16 AM  To page go to [email or web address] - use universal password  [facility] -  Office  [phone]    Objective:   Vitals:   10/14/20 0440 10/14/20 0600 10/14/20 0659 10/14/20 0729  BP:  (!) 89/66 109/62 107/65  Pulse:  69 72 70  Resp:  20 18 14   Temp:  98.3 F (36.8 C)  98.2 F (36.8 C)  TempSrc:    Axillary  SpO2:  97% 97% 96%  Weight: 56.1 kg     Height: 5\' 6"  (1.676 m)       Wt Readings from Last 3 Encounters:  10/14/20 56.1 kg  10/06/20 60.3 kg  09/17/20 69.4 kg     Intake/Output Summary (Last 24 hours) at 10/14/2020 1016 Last data filed at 10/14/2020 0942 Gross per 24 hour  Intake 200 ml  Output 400 ml  Net -200 ml     Physical Exam Gen Exam:Alert awake-not in any distress HEENT:atraumatic, normocephalic Chest: B/L clear to auscultation anteriorly CVS:S1S2 regular Abdomen:soft non tender, non distended Extremities:no edema Neurology: Non focal Skin: no rash   Data Review:    CBC Recent Labs  Lab 10/14/20 0134  WBC 17.0*  HGB 11.1*  HCT 37.2*  PLT 302  MCV 90.3  MCH 26.9  MCHC 29.8*  RDW 15.4  LYMPHSABS 0.6*  MONOABS 1.0  EOSABS 0.0  BASOSABS 0.0    Chemistries  Recent Labs  Lab 10/14/20 0134  NA 138  K 5.3*  CL 99  CO2 32  GLUCOSE 244*  BUN 39*  CREATININE 1.49*   CALCIUM 8.7*  MG 2.4  AST 22  ALT 31  ALKPHOS 37*  BILITOT 1.0   ------------------------------------------------------------------------------------------------------------------ No results for input(s): CHOL, HDL, LDLCALC, TRIG, CHOLHDL, LDLDIRECT in the last 72 hours.  Lab Results  Component Value Date   HGBA1C 7.2 (H) 10/14/2020   ------------------------------------------------------------------------------------------------------------------ No results for input(s): TSH, T4TOTAL, T3FREE, THYROIDAB in the last 72 hours.  Invalid input(s): FREET3 ------------------------------------------------------------------------------------------------------------------ No results for input(s): VITAMINB12, FOLATE, FERRITIN, TIBC, IRON, RETICCTPCT in the last 72 hours.  Coagulation profile No results for input(s): INR, PROTIME in the last 168 hours.  No results for input(s): DDIMER in the last 72 hours.  Cardiac Enzymes No results for input(s): CKMB, TROPONINI, MYOGLOBIN in the last 168 hours.  Invalid input(s): CK ------------------------------------------------------------------------------------------------------------------    Component Value Date/Time   BNP 1,309.0 (H) 10/04/2020 1602    Micro Results Recent Results (from the past 240 hour(s))  Resp Panel by RT-PCR (Flu A&B, Covid) Nasopharyngeal Swab     Status: Abnormal   Collection Time: 10/04/20  4:05 PM   Specimen: Nasopharyngeal Swab; Nasopharyngeal(NP) swabs in vial transport medium  Result Value Ref  Range Status   SARS Coronavirus 2 by RT PCR POSITIVE (A) NEGATIVE Final    Comment: RESULT CALLED TO, READ BACK BY AND VERIFIED WITH: OAKLEY,B @ 1750 ON 10/04/20 BY JUW (NOTE) SARS-CoV-2 target nucleic acids are DETECTED.  The SARS-CoV-2 RNA is generally detectable in upper respiratory specimens during the acute phase of infection. Positive results are indicative of the presence of the identified virus, but do not  rule out bacterial infection or co-infection with other pathogens not detected by the test. Clinical correlation with patient history and other diagnostic information is necessary to determine patient infection status. The expected result is Negative.  Fact Sheet for Patients: EntrepreneurPulse.com.au  Fact Sheet for Healthcare Providers: IncredibleEmployment.be  This test is not yet approved or cleared by the [place] FDA and  has been authorized for detection and/or diagnosis of SARS-CoV-2 by FDA under an Emergency Use Authorization (EUA).  This EUA will remain in effect (meaning this test can be  used) for the duration of  the COVID-19 declaration under Section 564(b)(1) of the Act, 21 U.S.C. section 360bbb-3(b)(1), unless the authorization is terminated or revoked sooner.     Influenza A by PCR NEGATIVE NEGATIVE Final   Influenza B by PCR NEGATIVE NEGATIVE Final    Comment: (NOTE) The Xpert Xpress SARS-CoV-2/FLU/RSV plus assay is intended as an aid in the diagnosis of influenza from Nasopharyngeal swab specimens and should not be used as a sole basis for treatment. Nasal washings and aspirates are unacceptable for Xpert Xpress SARS-CoV-2/FLU/RSV testing.  Fact Sheet for Patients: EntrepreneurPulse.com.au  Fact Sheet for Healthcare Providers: IncredibleEmployment.be  This test is not yet approved or cleared by the [place] FDA and has been authorized for detection and/or diagnosis of SARS-CoV-2 by FDA under an Emergency Use Authorization (EUA). This EUA will remain in effect (meaning this test can be used) for the duration of the COVID-19 declaration under Section 564(b)(1) of the Act, 21 U.S.C. section 360bbb-3(b)(1), unless the authorization is terminated or revoked.  Performed at [facility], [address], [place], [place] [address]   MRSA PCR Screening     Status: None   Collection  Time: 10/14/20  2:09 AM   Specimen: Nasal Mucosa; Nasopharyngeal  Result Value Ref Range Status   MRSA by PCR NEGATIVE NEGATIVE Final    Comment:        The GeneXpert MRSA Assay (FDA approved for NASAL specimens only), is one component of a comprehensive MRSA colonization surveillance program. It is not intended to diagnose MRSA infection nor to guide or monitor treatment for MRSA infections. Performed at [facility], [place] [address], [place],  [address]     Radiology Reports DG Chest Port 1 View  Result Date: 10/14/2020 CLINICAL DATA:  Chest pain and shortness of breath EXAM: PORTABLE CHEST 1 VIEW COMPARISON:  October 13, 2020 and October 04, 2020 FINDINGS: Areas of ill-defined airspace opacity in the lung bases persist without change. No new opacity evident. Heart is upper normal in size with pulmonary vascularity normal, stable. There is aortic atherosclerosis. No bone lesions. IMPRESSION: Ill-defined airspace opacity in the lung bases is stable. No new opacity evident. Stable cardiac silhouette. Electronically Signed   By: Lowella Grip III M.D.   On: 10/14/2020 08:20   DG Chest Portable 1 View  Result Date: 10/04/2020 CLINICAL DATA:  Shortness of breath history of congestive heart failure and COPD. EXAM: PORTABLE CHEST 1 VIEW COMPARISON:  July 17, 2020 FINDINGS: Enlarged cardiac silhouette. Tortuosity of  the aorta. Mediastinal contours appear intact. Subtle diffuse peribronchial airspace opacities. No pleural effusion or pneumothorax. Osseous structures are without acute abnormality. Soft tissues are grossly normal. IMPRESSION: 1. Enlarged cardiac silhouette. 2. Subtle diffuse peribronchial airspace opacities may represent bronchitis or atypical pneumonia. 3. Tortuosity of the aorta. Electronically Signed   By: Fidela Salisbury M.D.   On: 10/04/2020 16:12   ECHOCARDIOGRAM COMPLETE  Result Date: 10/05/2020    ECHOCARDIOGRAM REPORT   Patient Name:   BOYKIN BAETZ  Date of Exam: 10/05/2020 Medical Rec #:  [id number]      Height:       66.0 in Accession #:    [id number]     Weight:       145.0 lb Date of Birth:  [birth year]      BSA:          1.744 m Patient Age:    67 years       BP:           123/75 mmHg Patient Gender: M              HR:           57 bpm. Exam Location:  Inpatient Procedure: 2D Echo, Limited Echo, Cardiac Doppler, Color Doppler and            Intracardiac Opacification Agent Indications:    CHF-Acute Systolic F63.84  History:        Patient has prior history of Echocardiogram examinations, most                 recent 08/31/2019. CHF, COPD, Signs/Symptoms:Shortness of Breath                 and Chest Pain; Risk Factors:Hypertension, Diabetes and Former                 Smoker. COVID 19. Chronic kidney disease.  Sonographer:    Darlina Sicilian RDCS Referring Phys: [place]  1. Left ventricular ejection fraction, by estimation, is 20 to 25%. The left ventricle has severely decreased function. The left ventricle demonstrates global hypokinesis. The left ventricular internal cavity size was severely dilated. Left ventricular diastolic parameters are consistent with Grade III diastolic dysfunction (restrictive).  2. Right ventricular systolic function is normal. The right ventricular size is normal. There is normal pulmonary artery systolic pressure.  3. Left atrial size was mildly dilated.  4. The mitral valve is normal in structure. Trivial mitral valve regurgitation. No evidence of mitral stenosis.  5. The aortic valve is tricuspid. Aortic valve regurgitation is not visualized. No aortic stenosis is present.  6. The inferior vena cava is normal in size with greater than 50% respiratory variability, suggesting right atrial pressure of 3 mmHg. FINDINGS  Left Ventricle: Left ventricular ejection fraction, by estimation, is 20 to 25%. The left ventricle has severely decreased function. The left ventricle demonstrates global hypokinesis. Definity  contrast agent was given IV to delineate the left ventricular endocardial borders. The left ventricular internal cavity size was severely dilated. There is no left ventricular hypertrophy. Left ventricular diastolic parameters are consistent with Grade III diastolic dysfunction (restrictive). Right Ventricle: The right ventricular size is normal. Right ventricular systolic function is normal. There is normal pulmonary artery systolic pressure. The tricuspid regurgitant velocity is 1.69 m/s, and with an assumed right atrial pressure of 3 mmHg,  the estimated right ventricular systolic pressure is 66.5 mmHg. Left Atrium: Left atrial size was mildly dilated. Right  Atrium: Right atrial size was normal in size. Pericardium: There is no evidence of pericardial effusion. Mitral Valve: The mitral valve is normal in structure. Trivial mitral valve regurgitation. No evidence of mitral valve stenosis. Tricuspid Valve: The tricuspid valve is normal in structure. Tricuspid valve regurgitation is mild . No evidence of tricuspid stenosis. Aortic Valve: The aortic valve is tricuspid. Aortic valve regurgitation is not visualized. No aortic stenosis is present. Pulmonic Valve: The pulmonic valve was not well visualized. Pulmonic valve regurgitation is not visualized. No evidence of pulmonic stenosis. Aorta: The aortic root is normal in size and structure. Venous: The inferior vena cava is normal in size with greater than 50% respiratory variability, suggesting right atrial pressure of 3 mmHg.  LEFT VENTRICLE PLAX 2D LVIDd:         6.72 cm  Diastology LVIDs:         5.79 cm  LV e' medial:    3.02 cm/s LV PW:         1.02 cm  LV E/e' medial:  23.5 LV IVS:        0.91 cm  LV e' lateral:   3.64 cm/s LVOT diam:     2.50 cm  LV E/e' lateral: 19.5 LV SV:         47 LV SV Index:   27 LVOT Area:     4.91 cm  RIGHT VENTRICLE RV S prime:     9.62 cm/s TAPSE (M-mode): 2.9 cm LEFT ATRIUM             Index       RIGHT ATRIUM           Index LA  diam:        4.70 cm 2.69 cm/m  RA Area:     15.70 cm LA Vol (A2C):   50.0 ml 28.66 ml/m RA Volume:   36.50 ml  20.92 ml/m LA Vol (A4C):   66.4 ml 38.07 ml/m LA Biplane Vol: 61.0 ml 34.97 ml/m  AORTIC VALVE LVOT Vmax:   54.30 cm/s LVOT Vmean:  41.500 cm/s LVOT VTI:    0.096 m  AORTA Ao Root diam: 3.40 cm MITRAL VALVE               TRICUSPID VALVE MV Area (PHT): 5.43 cm    TR Peak grad:   11.4 mmHg MV Decel Time: 140 msec    TR Vmax:        169.00 cm/s MV E velocity: 70.87 cm/s                            [place]UNTS                            Systemic VTI:  0.10 m                            Systemic Diam: 2.50 cm Kirk Ruths MD Electronically signed by Kirk Ruths MD Signature Date/Time: 10/05/2020/10:38:14 AM    Final

## 2020-10-14 NOTE — H&P (Addendum)
History and Physical    Gregory Mitchell IEP:[id number] DOB: [birth year] DOA: 10/14/2020  PCP: Neale Burly, MD   Patient coming from: Home   Chief Complaint: SOB, chest pain, cough   HPI: Gregory Mitchell is a 80 y.o. male with medical history significant for COPD with chronic 2 L/min supplemental oxygen requirement, chronic kidney disease 3 a, history of PE on Eliquis, chronic combined systolic and diastolic CHF, type 2 diabetes mellitus, and recent admission with COVID-19, discharged home on 10/06/2020 and now returning to the emergency department with increased chest pain, severely increased shortness of breath, and worsening productive cough.  Patient reports that he was not back to his baseline but feeling much better when he left the hospital recently but after a couple days his chronic chest pain began to worsen, his shortness of breath also increased, and he has had worsening cough with increasing sputum production.  Shortness of breath became severe even at rest yesterday which prompted his presentation to the ED.  He reports adherence with Eliquis and denies any leg swelling or tenderness.  Denies hemoptysis.  [facility] ED Course: Upon arrival to the ED, patient is found to be afebrile, saturating mid 90s on BiPAP, tachypneic in the 30s, and with blood pressure 107/65.  EKG features sinus rhythm with LAD, RBBB, and lateral T wave abnormality.  Chest x-ray concerning for left lower lobe pneumonia with underlying chronic lung disease.  Chemistry panel features a potassium 5.1, albumin 2.4, glucose 191, and creatinine 1.62.  Lactic acid was 2.8, then 2.1, then 1.1.  proBNP elevated to 8474, lower than priors.  Troponin went from 90 to 262 to 291.  Patient was treated with Rocephin, azithromycin, IV Lasix, IV magnesium, and IV Solu-Medrol.  Respiratory rate and work of breathing improved and he was transitioned off of BiPAP to nasal cannula.  He was transferred to [facility] for  admission.  Review of Systems:  All other systems reviewed and apart from HPI, are negative.  Past Medical History:  Diagnosis Date  . Asthma   . Chronic renal insufficiency   . COPD (chronic obstructive pulmonary disease) ([place])   . Heart failure (HCC)    Acute on chronic combined systolic and diastolic heart failure  . Hypertension   . Pulmonary emboli ([place]) 03/2019  . Renal insufficiency   . Type 2 diabetes mellitus ([place])     Past Surgical History:  Procedure Laterality Date  . APPENDECTOMY    . BALLOON DILATION N/A 03/18/2020   Procedure: BALLOON DILATION;  Surgeon: Eloise Harman, DO;  Location[facility];  Service: Endoscopy;  Laterality: N/A;  . CHOLECYSTECTOMY    . COLONOSCOPY  10/2006   Dr.Anwar:normal  . ESOPHAGOGASTRODUODENOSCOPY (EGD) WITH PROPOFOL N/A 03/18/2020   Procedure: ESOPHAGOGASTRODUODENOSCOPY (EGD) WITH PROPOFOL;  Surgeon: Eloise Harman, DO;  Location: [facility];  Service: Endoscopy;  Laterality: N/A;  2:30pm  . NASAL SINUS SURGERY    . NEPHRECTOMY     RIGHT (Question CA.  No further therapy needed.)  . RIGHT/LEFT HEART CATH AND CORONARY ANGIOGRAPHY N/A 09/03/2019   Procedure: RIGHT/LEFT HEART CATH AND CORONARY ANGIOGRAPHY;  Surgeon: Lorretta Harp, MD;  Location: [place] [facility];  Service: Cardiovascular;  Laterality: N/A;    Social History:   reports that he quit smoking about 44 years ago. His smoking use included cigarettes. He started smoking about 63 years ago. He has a 15.00 pack-year smoking history. He quit smokeless tobacco use about  14 years ago.  His smokeless tobacco use included chew. He reports previous alcohol use. He reports that he does not use drugs.  Allergies  Allergen Reactions  . Amoxicillin Other (See Comments)    Makes eyes turn yellow and burn  . Ciprofloxacin     Pt not sure  . Sulfa Antibiotics Rash    Pt not sure of reaction    Family History  Problem Relation Age of Onset  . Lung cancer Brother   .  Stroke Mother   . Breast cancer Mother   . Colon cancer Neg Hx      Prior to Admission medications   Medication Sig Start Date End Date Taking? Authorizing Provider  albuterol (VENTOLIN HFA) 108 (90 Base) MCG/ACT inhaler Inhale 2 puffs into the lungs every 6 (six) hours as needed for wheezing or shortness of breath.     [provider]  apixaban (ELIQUIS) 5 MG TABS tablet Take 5 mg by mouth 2 (two) times daily.    [provider]  carvedilol (COREG) 12.5 MG tablet Take 1 tablet (12.5 mg total) by mouth 2 (two) times daily. 09/04/20 09/04/21  Darrick Grinder D, NP  dapagliflozin propanediol (FARXIGA) 10 MG TABS tablet Take 1 tablet (10 mg total) by mouth daily before breakfast. 08/13/20   Larey Dresser, MD  diclofenac Sodium (VOLTAREN) 1 % GEL Apply 1 application topically 4 (four) times daily. 08/29/20   [provider]  furosemide (LASIX) 20 MG tablet Take 20 mg by mouth 2 (two) times daily.    [provider]  hydrALAZINE (APRESOLINE) 10 MG tablet Take 10 mg by mouth 3 (three) times daily. 08/27/20   [provider]  HYDROcodone-acetaminophen (NORCO) 7.5-325 MG tablet Take 1 tablet by mouth 2 (two) times daily as needed for moderate pain. 09/15/20   [provider]  ipratropium-albuterol (DUONEB) 0.5-2.5 (3) MG/3ML SOLN Take 3 mLs by nebulization every 6 (six) hours as needed (for shortness of breath/wheezing).     [provider]  metFORMIN (GLUCOPHAGE-XR) 500 MG 24 hr tablet Take 1,000 mg by mouth 2 (two) times daily.    [provider]  mirtazapine (REMERON) 15 MG tablet Take 15 mg by mouth daily. 07/31/20   [provider]  omeprazole (PRILOSEC) 40 MG capsule Take 1 capsule (40 mg total) by mouth 2 (two) times daily before a meal. 30 minutes before breakfast and 30 minutes before dinner 07/15/20 01/11/21  Eloise Harman, DO  OXYGEN Inhale 2 L into the lungs at bedtime as needed (for shortness of breath).    [provider]  predniSONE (DELTASONE) 50 MG tablet Take 1 tablet (50 mg total) by mouth 2 (two) times daily with a meal. 10/06/20   Tat, Shanon Brow, MD  sacubitril-valsartan (ENTRESTO) 24-26 MG Take 1 tablet by mouth 2 (two) times daily. 08/13/20   Larey Dresser, MD  SYMBICORT 160-4.5 MCG/ACT inhaler Inhale 2 puffs into the lungs in the morning and at bedtime. 02/03/20   [provider]  tamsulosin (FLOMAX) 0.4 MG CAPS capsule Take 0.4 mg by mouth daily. 09/09/20   [provider]    Physical Exam: Vitals:   10/14/20 0046 10/14/20 0138  BP: 91/64 (!) 95/58  Pulse: 63 69  Resp: 20 20  Temp: 98.2 F (36.8 C)   TempSrc: Oral   SpO2: 97% 97%    Constitutional: NAD, calm  Eyes: PERTLA, lids and conjunctivae normal ENMT: Mucous membranes are moist. Posterior pharynx clear of any  exudate or lesions.   Neck: normal, supple, no masses, no thyromegaly Respiratory: Coarse rhonchi, no wheezing. No pallor or cyanosis.  Cardiovascular: S1 & S2 heard, regular rate and rhythm. No extremity edema.  Abdomen: No distension, no tenderness, soft. Bowel sounds active.  Musculoskeletal: no clubbing / cyanosis. No joint deformity upper and lower extremities.   Skin: no significant rashes, lesions, ulcers. Warm, dry, well-perfused. Neurologic: CN 2-12 grossly intact. Sensation intact. Moving all extremities.  Psychiatric: Alert and oriented to person, place, and situation. Calm and cooperative.    Labs and Imaging on Admission: I have personally reviewed following labs and imaging studies  CBC: No results for input(s): WBC, NEUTROABS, HGB, HCT, MCV, PLT in the last 168 hours. Basic Metabolic Panel: No results for input(s): NA, K, CL, CO2, GLUCOSE, BUN, CREATININE, CALCIUM, MG, PHOS in the last 168 hours. GFR: Estimated Creatinine Clearance: 38.4 mL/min (A) (by C-G formula based on SCr of 1.31 mg/dL (H)). Liver Function Tests: No results for input(s): AST, ALT, ALKPHOS, BILITOT, PROT,  ALBUMIN in the last 168 hours. No results for input(s): LIPASE, AMYLASE in the last 168 hours. No results for input(s): AMMONIA in the last 168 hours. Coagulation Profile: No results for input(s): INR, PROTIME in the last 168 hours. Cardiac Enzymes: No results for input(s): CKTOTAL, CKMB, CKMBINDEX, TROPONINI in the last 168 hours. BNP (last 3 results) No results for input(s): PROBNP in the last 8760 hours. HbA1C: No results for input(s): HGBA1C in the last 72 hours. CBG: Recent Labs  Lab 10/14/20 0041  GLUCAP 215*   Lipid Profile: No results for input(s): CHOL, HDL, LDLCALC, TRIG, CHOLHDL, LDLDIRECT in the last 72 hours. Thyroid Function Tests: No results for input(s): TSH, T4TOTAL, FREET4, T3FREE, THYROIDAB in the last 72 hours. Anemia Panel: No results for input(s): VITAMINB12, FOLATE, FERRITIN, TIBC, IRON, RETICCTPCT in the last 72 hours. Urine analysis:    Component Value Date/Time   COLORURINE YELLOW 10/04/2020 1826   APPEARANCEUR CLEAR 10/04/2020 1826   LABSPEC 1.012 10/04/2020 1826   PHURINE 7.0 10/04/2020 1826   GLUCOSEU 50 (A) 10/04/2020 1826   HGBUR LARGE (A) 10/04/2020 1826   BILIRUBINUR NEGATIVE 10/04/2020 1826   KETONESUR NEGATIVE 10/04/2020 1826   PROTEINUR NEGATIVE 10/04/2020 1826   NITRITE NEGATIVE 10/04/2020 1826   LEUKOCYTESUR NEGATIVE 10/04/2020 1826   Sepsis Labs: @LABRCNTIP (procalcitonin:4,lacticidven:4) ) Recent Results (from the past 240 hour(s))  Resp Panel by RT-PCR (Flu A&B, Covid) Nasopharyngeal Swab     Status: Abnormal   Collection Time: 10/04/20  4:05 PM   Specimen: Nasopharyngeal Swab; Nasopharyngeal(NP) swabs in vial transport medium  Result Value Ref Range Status   SARS Coronavirus 2 by RT PCR POSITIVE (A) NEGATIVE Final    Comment: RESULT CALLED TO, READ BACK BY AND VERIFIED WITH: OAKLEY,B @ 1750 ON 10/04/20 BY JUW (NOTE) SARS-CoV-2 target nucleic acids are DETECTED.  The SARS-CoV-2 RNA is generally detectable in upper  respiratory specimens during the acute phase of infection. Positive results are indicative of the presence of the identified virus, but do not rule out bacterial infection or co-infection with other pathogens not detected by the test. Clinical correlation with patient history and other diagnostic information is necessary to determine patient infection status. The expected result is Negative.  Fact Sheet for Patients: EntrepreneurPulse.com.au  Fact Sheet for Healthcare Providers: IncredibleEmployment.be  This test is not yet approved or cleared by the [place] FDA and  has been authorized for detection and/or diagnosis of SARS-CoV-2 by FDA under an Emergency Use  Authorization (EUA).  This EUA will remain in effect (meaning this test can be  used) for the duration of  the COVID-19 declaration under Section 564(b)(1) of the Act, 21 U.S.C. section 360bbb-3(b)(1), unless the authorization is terminated or revoked sooner.     Influenza A by PCR NEGATIVE NEGATIVE Final   Influenza B by PCR NEGATIVE NEGATIVE Final    Comment: (NOTE) The Xpert Xpress SARS-CoV-2/FLU/RSV plus assay is intended as an aid in the diagnosis of influenza from Nasopharyngeal swab specimens and should not be used as a sole basis for treatment. Nasal washings and aspirates are unacceptable for Xpert Xpress SARS-CoV-2/FLU/RSV testing.  Fact Sheet for Patients: EntrepreneurPulse.com.au  Fact Sheet for Healthcare Providers: IncredibleEmployment.be  This test is not yet approved or cleared by the [place] FDA and has been authorized for detection and/or diagnosis of SARS-CoV-2 by FDA under an Emergency Use Authorization (EUA). This EUA will remain in effect (meaning this test can be used) for the duration of the COVID-19 declaration under Section 564(b)(1) of the Act, 21 U.S.C. section 360bbb-3(b)(1), unless the authorization is  terminated or revoked.  Performed at [facility], [address] [place], [place] [address]      Radiological Exams on Admission: No results found.  EKG: Independently reviewed. Sinus rhythm, LAD, RBBB, lateral T-wave abnormality.   Assessment/Plan   1. Pneumonia; acute on chronic hypoxic respiratory failure  - Presents with increased SOB, chest pain, and productive cough 7 days after discharge for covid, had CXR concerning for LLL pneumonia, and was requiring BiPAP initially   - He was started on Rocephin and azithromycin at [facility] ED, also given IV Lasix, and now back down to 3 Lpm supplemental O2  - Check sputum culture, check strep pneumo and legionella antigens, check MRSA pcr, trend procalcitonin, continue Rocephin and azithromycin, and continue supplemental O2    2. COVID-19  - Admitted 10/04/20 with COVID-19, had improvement in markers and subjective dyspnea, and returned home 10/06/20  - Current presentation consistent with secondary bacterial PNA  - Continue isolation for 21 days from 10/04/20   3. COPD  - No wheezing on admission, increased sputum production and SOB d/t PNA  - Continue inhalers and supplemental O2    4. Chronic combined systolic & diastolic CHF  - Appears compensated, received 20 mg IV Lasix in ED  - Echo from 10/05/20 with EF 20-25% and grade 3 diastolic dysfunction  - Resume oral Lasix, continue Entresto, Coreg, and Farxiga    5. Type II DM  - Check CBGs, continue Farxiga, use low-intensity SSI    6. Acute kidney injury superimposed on CKD IIIa  - SCr was 1.62 on admission, up from apparent baseline closer to 1.3  - Renally-dose medications, repeat chem panel   7. History of PE  - Continue Eliquis   8. Chest pain; elevated troponin  - Patient complaining of chest pain, had HS troponin 90 then 262 then 291, EKG similar to prior, and LLL pneumonia on CXR  - Patient appears to have chronic chest pain, had normal coronaries on cath one year ago and  this, and troponin elevations likely reflect type II scenario in setting of PNA and respiratory failure, less likely PE given reported adherence with Eliquis   - Continue cardiac monitoring, trend troponin, continue beta-blocker    DVT prophylaxis: Eliquis  Code Status: Full  Level of Care: Level of care: Telemetry Cardiac Family Communication: None available  Disposition Plan:  Patient is from: home  Anticipated  d/c is to: TBD Anticipated d/c date is: 10/17/20 Patient currently: Pending improvement in respiratory status, repeat labs, inpatient treatment of PNA (high PORT score)  Consults called: none  Admission status: Inpatient     Vianne Bulls, MD [facility]  10/14/2020, 1:42 AM

## 2020-10-15 DIAGNOSIS — N1831 Chronic kidney disease, stage 3a: Secondary | ICD-10-CM | POA: Diagnosis not present

## 2020-10-15 DIAGNOSIS — R0789 Other chest pain: Secondary | ICD-10-CM | POA: Diagnosis not present

## 2020-10-15 DIAGNOSIS — J159 Unspecified bacterial pneumonia: Secondary | ICD-10-CM | POA: Diagnosis not present

## 2020-10-15 DIAGNOSIS — J9621 Acute and chronic respiratory failure with hypoxia: Secondary | ICD-10-CM | POA: Diagnosis not present

## 2020-10-15 LAB — PROCALCITONIN: Procalcitonin: 2.46 ng/mL

## 2020-10-15 LAB — COMPREHENSIVE METABOLIC PANEL
ALT: 41 U/L (ref 0–44)
AST: 52 U/L — ABNORMAL HIGH (ref 15–41)
Albumin: 1.8 g/dL — ABNORMAL LOW (ref 3.5–5.0)
Alkaline Phosphatase: 40 U/L (ref 38–126)
Anion gap: 12 (ref 5–15)
BUN: 46 mg/dL — ABNORMAL HIGH (ref 8–23)
CO2: 25 mmol/L (ref 22–32)
Calcium: 8.4 mg/dL — ABNORMAL LOW (ref 8.9–10.3)
Chloride: 99 mmol/L (ref 98–111)
Creatinine, Ser: 1.49 mg/dL — ABNORMAL HIGH (ref 0.61–1.24)
GFR, Estimated: 47 mL/min — ABNORMAL LOW (ref >60)
Glucose, Bld: 311 mg/dL — ABNORMAL HIGH (ref 70–99)
Potassium: 5 mmol/L (ref 3.5–5.1)
Sodium: 136 mmol/L (ref 135–145)
Total Bilirubin: 0.4 mg/dL (ref 0.3–1.2)
Total Protein: 5 g/dL — ABNORMAL LOW (ref 6.5–8.1)

## 2020-10-15 LAB — CBC WITH DIFFERENTIAL/PLATELET
Abs Immature Granulocytes: 0.05 10*3/uL (ref 0.00–0.07)
Basophils Absolute: 0 10*3/uL (ref 0.0–0.1)
Basophils Relative: 0 %
Eosinophils Absolute: 0 10*3/uL (ref 0.0–0.5)
Eosinophils Relative: 0 %
HCT: 33.2 % — ABNORMAL LOW (ref 39.0–52.0)
Hemoglobin: 10.2 g/dL — ABNORMAL LOW (ref 13.0–17.0)
Immature Granulocytes: 0 %
Lymphocytes Relative: 3 %
Lymphs Abs: 0.5 10*3/uL — ABNORMAL LOW (ref 0.7–4.0)
MCH: 27.4 pg (ref 26.0–34.0)
MCHC: 30.7 g/dL (ref 30.0–36.0)
MCV: 89.2 fL (ref 80.0–100.0)
Monocytes Absolute: 0.6 10*3/uL (ref 0.1–1.0)
Monocytes Relative: 4 %
Neutro Abs: 13.3 10*3/uL — ABNORMAL HIGH (ref 1.7–7.7)
Neutrophils Relative %: 93 %
Platelets: 289 10*3/uL (ref 150–400)
RBC: 3.72 MIL/uL — ABNORMAL LOW (ref 4.22–5.81)
RDW: 15.8 % — ABNORMAL HIGH (ref 11.5–15.5)
WBC: 14.4 10*3/uL — ABNORMAL HIGH (ref 4.0–10.5)
nRBC: 0 % (ref 0.0–0.2)

## 2020-10-15 LAB — GLUCOSE, CAPILLARY
Glucose-Capillary: 289 mg/dL — ABNORMAL HIGH (ref 70–99)
Glucose-Capillary: 346 mg/dL — ABNORMAL HIGH (ref 70–99)
Glucose-Capillary: 258 mg/dL — ABNORMAL HIGH (ref 70–99)
Glucose-Capillary: 192 mg/dL — ABNORMAL HIGH (ref 70–99)

## 2020-10-15 LAB — D-DIMER, QUANTITATIVE: D-Dimer, Quant: 0.73 ug/mL-FEU — ABNORMAL HIGH (ref 0.00–0.50)

## 2020-10-15 LAB — C-REACTIVE PROTEIN: CRP: 17.2 mg/dL — ABNORMAL HIGH (ref <1.0)

## 2020-10-15 MED ORDER — INSULIN GLARGINE 100 UNIT/ML ~~LOC~~ SOLN
10.0000 [IU] | Freq: Every day | SUBCUTANEOUS | Status: DC
  Filled 2020-10-15: qty 0.1

## 2020-10-15 MED ORDER — INSULIN ASPART 100 UNIT/ML ~~LOC~~ SOLN
0.0000 [IU] | Freq: Three times a day (TID) | SUBCUTANEOUS | Status: DC
  Administered 2020-10-15: 11 [IU] via SUBCUTANEOUS
  Administered 2020-10-15: 8 [IU] via SUBCUTANEOUS
  Administered 2020-10-16: 3 [IU] via SUBCUTANEOUS
  Administered 2020-10-16: 8 [IU] via SUBCUTANEOUS

## 2020-10-15 MED ORDER — INSULIN ASPART 100 UNIT/ML ~~LOC~~ SOLN: 0.0000 [IU] | Freq: Three times a day (TID) | SUBCUTANEOUS | Status: DC

## 2020-10-15 MED ORDER — INSULIN GLARGINE 100 UNIT/ML ~~LOC~~ SOLN
10.0000 [IU] | Freq: Every day | SUBCUTANEOUS | Status: DC
  Administered 2020-10-15 – 2020-10-16 (×2): 10 [IU] via SUBCUTANEOUS
  Filled 2020-10-15 (×2): qty 0.1

## 2020-10-15 MED ORDER — INSULIN ASPART 100 UNIT/ML ~~LOC~~ SOLN
4.0000 [IU] | Freq: Three times a day (TID) | SUBCUTANEOUS | Status: DC
  Administered 2020-10-15 – 2020-10-16 (×3): 4 [IU] via SUBCUTANEOUS

## 2020-10-15 NOTE — Progress Notes (Signed)
PROGRESS NOTE                                                                                                                                                                                                             Patient Demographics:    Gregory Mitchell, is a 80 y.o. male, DOB - Jun 12, 1941, NID:[id number]  Outpatient Primary MD for the patient is Neale Burly, MD   Admit date - 10/14/2020   LOS - 1  No chief complaint on file.      Brief Narrative: Patient is a 80 y.o. male with PMHx of COPD on 2 L of oxygen at home, CKD stage IIIa, PE on Eliquis, chronic combined systolic and diastolic heart failure, [id number] hospitalized at [facility] from 2/26-2/28 for COVID-19 pneumonia-presenting with worsening shortness of breath thought to be due to superimposed bacterial pneumonia.  See below for further details   COVID-19 vaccinated status:   Significant Events: 2/26-2/28>> hospitalized at [facility] for acute on chronic hypoxic resp failure due to COVID-19 pneumonia 3/7>> transfer from [facility]-worsening shortness of breath-severely hypoxic requiring BiPAP-thought to have bacterial pneumonia.    Significant studies: 3/8>> chest x-ray: Ill-defined opacity in bilateral lung bases  COVID-19 medications: None  Antibiotics: Rocephin: 3/7>> Zithromax: 3/7>>  Microbiology data: None  Procedures: 09/03/2019>> LHC: Normal coronaries 2/27//2022>> Echo: EF 76-19%, grade 3 diastolic dysfunction  Consults: None  DVT prophylaxis: apixaban (ELIQUIS) tablet 5 mg     Subjective:   Feels much better-requesting discharge.   Assessment  & Plan :   Acute on chronic hypoxic respiratory failure (on 2 L at home) due to bacterial pneumonia+/-lingering COVID-19 pneumonitis: Overall significantly improved-titrated back down to just 2 L of oxygen.  Given that he has underlying COPD-and this is his second hospitalization in a few  days-suspect it is reasonable to continue with IV steroids/IV antibiotics for another 24/48 hours before considering discharge.   Fever: afebrile O2 requirements:  SpO2: 95 % O2 Flow Rate (L/min): 2 L/min   COVID-19 Labs: Recent Labs    10/15/20 0036  DDIMER 0.73*  CRP 17.2*       Component Value Date/Time   BNP 1,309.0 (H) 10/04/2020 1602    Recent Labs  Lab 10/14/20 0134 10/15/20 0036  PROCALCITON 2.55 2.46    Lab Results  Component Value Date   SARSCOV2NAA POSITIVE (  A) 10/04/2020   SARSCOV2NAA NEGATIVE 07/06/2020   SARSCOV2NAA NEGATIVE 03/17/2020   [place] NEGATIVE 08/31/2019     Prone/Incentive Spirometry: encouraged  incentive spirometry use 3-4/hour.  COPD exacerbation: Improved-no wheezing today-continue bronchodilators-we will taper steroids over the next few days.   Chronic combined systolic and diastolic heart failure: Volume status stable-continue Entresto/Coreg/Lasix.  Hold [place] for now.  AKI on CKD stage IIIa: Remains unchanged-watch closely-hopefully will plateau and come down to his usual range soon.  Hyperkalemia: Resolved.  DM-2 (A1c 7.2 on 3/8) with uncontrolled hyperglycemia due to steroids: Start 10 units of Lantus-start avoidance of NovoLog with meals-change to moderate scale SSI.  Hold [place] for now.    Recent Labs    10/14/20 2035 10/15/20 0727 10/15/20 1201  GLUCAP 281* 289* 346*    History of PE: Continue Eliquis  Minimally elevated troponin: Secondary demand ischemia-not consistent with ACS.  Bilateral chest pain: Has resolved-likely musculoskeletal in etiology.  LHC in January 2021 was negative.  Already on full dose anticoagulation.  Very low suspicion for PE.  Acute urine retention-history of BPH: Apparently had Foley catheter placed at outside hospital before transfer-Per family-able to urinate on his own-we will attempt voiding trial today-continue Flomax  Chronic debility/deconditioning: Appreciate PT/OT  eval-plans are for home health on discharge.  RN pressure injury documentation: Pressure Injury 10/14/20 Sacrum Medial Stage 2 -  Partial thickness loss of dermis presenting as a shallow open injury with a red, pink wound bed without slough. 2x2 cm (Active)  10/14/20 0045  Location: Sacrum  Location Orientation: Medial  Staging: Stage 2 -  Partial thickness loss of dermis presenting as a shallow open injury with a red, pink wound bed without slough.  Wound Description (Comments): 2x2 cm  Present on Admission: Yes    GI prophylaxis: PPI  ABG:    Component Value Date/Time   PHART 7.379 09/03/2019 0928   PCO2ART 46.7 09/03/2019 0928   PO2ART 94.0 09/03/2019 0928   HCO3 27.6 09/03/2019 0928   TCO2 29 09/03/2019 0928   O2SAT 97.0 09/03/2019 0928    Vent Settings: N/A    Condition -Guarded  Family Communication  : Daughter-Rachel-[phone] updated over the phone on 3/8  Code Status :  Full Code  Diet :  Diet Order            Diet heart healthy/carb modified Room service appropriate? Yes; Fluid consistency: Thin  Diet effective now                  Disposition Plan  :   Status is: Inpatient  Remains inpatient appropriate because:Inpatient level of care appropriate due to severity of illness   Dispo: The patient is from: Home              Anticipated d/c is to: Home              Patient currently is not medically stable to d/c.   Difficult to place patient No    Barriers to discharge: Hypoxia requiring O2 supplementation/on IV Abx  Antimicorbials  :    Anti-infectives (From admission, onward)   Start     Dose/Rate Route Frequency Ordered Stop   10/14/20 1300  cefTRIAXone (ROCEPHIN) 2 g in sodium chloride 0.9 % 100 mL IVPB        2 g 200 mL/hr over 30 Minutes Intravenous Every 24 hours 10/14/20 0113 10/18/20 1259   10/14/20 1300  azithromycin (ZITHROMAX) 500 mg in sodium chloride 0.9 % 250 mL IVPB  500 mg 250 mL/hr over 60 Minutes Intravenous Every  24 hours 10/14/20 0113 10/18/20 1259      Inpatient Medications  Scheduled Meds: . apixaban  5 mg Oral BID  . carvedilol  12.5 mg Oral BID  . Chlorhexidine Gluconate Cloth  6 each Topical Daily  . dapagliflozin propanediol  10 mg Oral QAC breakfast  . diclofenac Sodium  1 application Topical QID  . furosemide  20 mg Oral BID  . insulin aspart  0-6 Units Subcutaneous TID WC  . levalbuterol  2 puff Inhalation Q8H  . methylPREDNISolone (SOLU-MEDROL) injection  40 mg Intravenous Q12H  . mirtazapine  15 mg Oral Daily  . mometasone-formoterol  2 puff Inhalation BID  . pantoprazole  40 mg Oral Daily  . sacubitril-valsartan  1 tablet Oral BID  . tamsulosin  0.4 mg Oral Daily   Continuous Infusions: . azithromycin Stopped (10/14/20 1531)  . cefTRIAXone (ROCEPHIN)  IV Stopped (10/14/20 1300)   PRN Meds:.acetaminophen **OR** acetaminophen, alum & mag hydroxide-simeth, chlorpheniramine-HYDROcodone, guaiFENesin-dextromethorphan, HYDROcodone-acetaminophen, ondansetron **OR** ondansetron (ZOFRAN) IV, senna-docusate   Time Spent in minutes  25   See all Orders from today for further details   Oren Binet M.D on 10/15/2020 at 12:04 PM  To page go to [email or web address] - use universal password  [facility] -  Office  [phone]    Objective:   Vitals:   10/15/20 0503 10/15/20 0629 10/15/20 0757 10/15/20 0805  BP: 96/61     Pulse: 68     Resp: 20     Temp: 98 F (36.7 C)     TempSrc: Axillary     SpO2: 98%  97% 95%  Weight:  61.5 kg    Height:        Wt Readings from Last 3 Encounters:  10/15/20 61.5 kg  10/06/20 60.3 kg  09/17/20 69.4 kg     Intake/Output Summary (Last 24 hours) at 10/15/2020 1204 Last data filed at 10/15/2020 0981 Gross per 24 hour  Intake 590 ml  Output 1000 ml  Net -410 ml     Physical Exam Gen Exam:Alert awake-not in any distress HEENT:atraumatic, normocephalic Chest: B/L clear to auscultation anteriorly CVS:S1S2 regular Abdomen:soft  non tender, non distended Extremities:no edema Neurology: Non focal Skin: no rash   Data Review:    CBC Recent Labs  Lab 10/14/20 0134 10/15/20 0036  WBC 17.0* 14.4*  HGB 11.1* 10.2*  HCT 37.2* 33.2*  PLT 302 289  MCV 90.3 89.2  MCH 26.9 27.4  MCHC 29.8* 30.7  RDW 15.4 15.8*  LYMPHSABS 0.6* 0.5*  MONOABS 1.0 0.6  EOSABS 0.0 0.0  BASOSABS 0.0 0.0    Chemistries  Recent Labs  Lab 10/14/20 0134 10/15/20 0036  NA 138 136  K 5.3* 5.0  CL 99 99  CO2 32 25  GLUCOSE 244* 311*  BUN 39* 46*  CREATININE 1.49* 1.49*  CALCIUM 8.7* 8.4*  MG 2.4  --   AST 22 52*  ALT 31 41  ALKPHOS 37* 40  BILITOT 1.0 0.4   ------------------------------------------------------------------------------------------------------------------ No results for input(s): CHOL, HDL, LDLCALC, TRIG, CHOLHDL, LDLDIRECT in the last 72 hours.  Lab Results  Component Value Date   HGBA1C 7.2 (H) 10/14/2020   ------------------------------------------------------------------------------------------------------------------ No results for input(s): TSH, T4TOTAL, T3FREE, THYROIDAB in the last 72 hours.  Invalid input(s): FREET3 ------------------------------------------------------------------------------------------------------------------ No results for input(s): VITAMINB12, FOLATE, FERRITIN, TIBC, IRON, RETICCTPCT in the last 72 hours.  Coagulation profile No results for input(s): INR, PROTIME in the last 168  hours.  Recent Labs    10/15/20 0036  DDIMER 0.73*    Cardiac Enzymes No results for input(s): CKMB, TROPONINI, MYOGLOBIN in the last 168 hours.  Invalid input(s): CK ------------------------------------------------------------------------------------------------------------------    Component Value Date/Time   BNP 1,309.0 (H) 10/04/2020 1602    Micro Results Recent Results (from the past 240 hour(s))  MRSA PCR Screening     Status: None   Collection Time: 10/14/20  2:09 AM    Specimen: Nasal Mucosa; Nasopharyngeal  Result Value Ref Range Status   MRSA by PCR NEGATIVE NEGATIVE Final    Comment:        The GeneXpert MRSA Assay (FDA approved for NASAL specimens only), is one component of a comprehensive MRSA colonization surveillance program. It is not intended to diagnose MRSA infection nor to guide or monitor treatment for MRSA infections. Performed at [facility], [place] [address] [place], [place] [address]     Radiology Reports DG Chest Port 1 View  Result Date: 10/14/2020 CLINICAL DATA:  Chest pain and shortness of breath EXAM: PORTABLE CHEST 1 VIEW COMPARISON:  October 13, 2020 and October 04, 2020 FINDINGS: Areas of ill-defined airspace opacity in the lung bases persist without change. No new opacity evident. Heart is upper normal in size with pulmonary vascularity normal, stable. There is aortic atherosclerosis. No bone lesions. IMPRESSION: Ill-defined airspace opacity in the lung bases is stable. No new opacity evident. Stable cardiac silhouette. Electronically Signed   By: Lowella Grip III M.D.   On: 10/14/2020 08:20   DG Chest Portable 1 View  Result Date: 10/04/2020 CLINICAL DATA:  Shortness of breath history of congestive heart failure and COPD. EXAM: PORTABLE CHEST 1 VIEW COMPARISON:  July 17, 2020 FINDINGS: Enlarged cardiac silhouette. Tortuosity of the aorta. Mediastinal contours appear intact. Subtle diffuse peribronchial airspace opacities. No pleural effusion or pneumothorax. Osseous structures are without acute abnormality. Soft tissues are grossly normal. IMPRESSION: 1. Enlarged cardiac silhouette. 2. Subtle diffuse peribronchial airspace opacities may represent bronchitis or atypical pneumonia. 3. Tortuosity of the aorta. Electronically Signed   By: Fidela Salisbury M.D.   On: 10/04/2020 16:12   ECHOCARDIOGRAM COMPLETE  Result Date: 10/05/2020    ECHOCARDIOGRAM REPORT   Patient Name:   Gregory Mitchell Date of Exam: 10/05/2020  Medical Rec #:  [id number]      Height:       66.0 in Accession #:    [id number]     Weight:       145.0 lb Date of Birth:  1941-04-16      BSA:          1.744 m Patient Age:    44 years       BP:           123/75 mmHg Patient Gender: M              HR:           57 bpm. Exam Location:  Inpatient Procedure: 2D Echo, Limited Echo, Cardiac Doppler, Color Doppler and            Intracardiac Opacification Agent Indications:    CHF-Acute Systolic V89.38  History:        Patient has prior history of Echocardiogram examinations, most                 recent 08/31/2019. CHF, COPD, Signs/Symptoms:Shortness of Breath  and Chest Pain; Risk Factors:Hypertension, Diabetes and Former                 Smoker. COVID 19. Chronic kidney disease.  Sonographer:    Darlina Sicilian RDCS Referring Phys: [place]  1. Left ventricular ejection fraction, by estimation, is 20 to 25%. The left ventricle has severely decreased function. The left ventricle demonstrates global hypokinesis. The left ventricular internal cavity size was severely dilated. Left ventricular diastolic parameters are consistent with Grade III diastolic dysfunction (restrictive).  2. Right ventricular systolic function is normal. The right ventricular size is normal. There is normal pulmonary artery systolic pressure.  3. Left atrial size was mildly dilated.  4. The mitral valve is normal in structure. Trivial mitral valve regurgitation. No evidence of mitral stenosis.  5. The aortic valve is tricuspid. Aortic valve regurgitation is not visualized. No aortic stenosis is present.  6. The inferior vena cava is normal in size with greater than 50% respiratory variability, suggesting right atrial pressure of 3 mmHg. FINDINGS  Left Ventricle: Left ventricular ejection fraction, by estimation, is 20 to 25%. The left ventricle has severely decreased function. The left ventricle demonstrates global hypokinesis. Definity contrast agent was given  IV to delineate the left ventricular endocardial borders. The left ventricular internal cavity size was severely dilated. There is no left ventricular hypertrophy. Left ventricular diastolic parameters are consistent with Grade III diastolic dysfunction (restrictive). Right Ventricle: The right ventricular size is normal. Right ventricular systolic function is normal. There is normal pulmonary artery systolic pressure. The tricuspid regurgitant velocity is 1.69 m/s, and with an assumed right atrial pressure of 3 mmHg,  the estimated right ventricular systolic pressure is 63.3 mmHg. Left Atrium: Left atrial size was mildly dilated. Right Atrium: Right atrial size was normal in size. Pericardium: There is no evidence of pericardial effusion. Mitral Valve: The mitral valve is normal in structure. Trivial mitral valve regurgitation. No evidence of mitral valve stenosis. Tricuspid Valve: The tricuspid valve is normal in structure. Tricuspid valve regurgitation is mild . No evidence of tricuspid stenosis. Aortic Valve: The aortic valve is tricuspid. Aortic valve regurgitation is not visualized. No aortic stenosis is present. Pulmonic Valve: The pulmonic valve was not well visualized. Pulmonic valve regurgitation is not visualized. No evidence of pulmonic stenosis. Aorta: The aortic root is normal in size and structure. Venous: The inferior vena cava is normal in size with greater than 50% respiratory variability, suggesting right atrial pressure of 3 mmHg.  LEFT VENTRICLE PLAX 2D LVIDd:         6.72 cm  Diastology LVIDs:         5.79 cm  LV e' medial:    3.02 cm/s LV PW:         1.02 cm  LV E/e' medial:  23.5 LV IVS:        0.91 cm  LV e' lateral:   3.64 cm/s LVOT diam:     2.50 cm  LV E/e' lateral: 19.5 LV SV:         47 LV SV Index:   27 LVOT Area:     4.91 cm  RIGHT VENTRICLE RV S prime:     9.62 cm/s TAPSE (M-mode): 2.9 cm LEFT ATRIUM             Index       RIGHT ATRIUM           Index LA diam:  4.70 cm 2.69  cm/m  RA Area:     15.70 cm LA Vol (A2C):   50.0 ml 28.66 ml/m RA Volume:   36.50 ml  20.92 ml/m LA Vol (A4C):   66.4 ml 38.07 ml/m LA Biplane Vol: 61.0 ml 34.97 ml/m  AORTIC VALVE LVOT Vmax:   54.30 cm/s LVOT Vmean:  41.500 cm/s LVOT VTI:    0.096 m  AORTA Ao Root diam: 3.40 cm MITRAL VALVE               TRICUSPID VALVE MV Area (PHT): 5.43 cm    TR Peak grad:   11.4 mmHg MV Decel Time: 140 msec    TR Vmax:        169.00 cm/s MV E velocity: 70.87 cm/s                            [place]                            Systemic VTI:  0.10 m                            Systemic Diam: 2.50 cm Kirk Ruths MD Electronically signed by Kirk Ruths MD Signature Date/Time: 10/05/2020/10:38:14 AM    Final

## 2020-10-15 NOTE — TOC Transition Note (Signed)
Transition of Care [facility]) - CM/SW Discharge Note   Patient Details  Name: Gregory Mitchell MRN: [id number] Date of Birth: 16-Jul-1941  [facility]) CM/SW Contact:  Carles Collet, RN Phone Number: 10/15/2020, 2:09 PM   Clinical Narrative:    Damaris Schooner w patient's daughter, she confirms patient has home oxygen. Requested RW, order placed and requested for delivery to room. She would like referral made to [facility] for [facility] services and speak directly to her. Spoke w liaison at [facility] and requested that they call daughter, Apolonio Schneiders, to set up home appointments so that patient does not politely decline again after he gets home.  Apolonio Schneiders to bring tank for transportation home and provide ride.     Final next level of care: [place] Barriers to Discharge: Continued Medical Work up   Patient Goals and CMS Choice Patient states their goals for this hospitalization and ongoing recovery are:: to return home CMS Medicare.gov Compare Post Acute Care list provided to:: Other (Comment Required) Choice offered to / list presented to : Adult Children  Discharge Placement                       Discharge Plan and Services                DME Arranged: [place] rolling DME Agency: [employer] Date DME Agency Contacted: 10/15/20 Time DME Agency Contacted: 1443 Representative spoke with at DME Agency: [place]: PT,OT [place]: [place] Date [place]: 10/15/20 Time [place]: 1407 Representative spoke with at [place]: Network engineer  Social Determinants of Health ([place]) Interventions     Readmission Risk Interventions Readmission Risk Prevention Plan 10/06/2020  Transportation Screening Complete  PCP or Specialist Appt within 5-7 Days Complete  Home Care Screening Complete  Medication Review (RN CM) Complete  Some recent data might be hidden

## 2020-10-15 NOTE — Progress Notes (Signed)
Inpatient Diabetes Program Recommendations  AACE/ADA: New Consensus Statement on Inpatient Glycemic Control (2015)  Target Ranges:  Prepandial:   less than 140 mg/dL      Peak postprandial:   less than 180 mg/dL (1-2 hours)      Critically ill patients:  140 - 180 mg/dL   Lab Results  Component Value Date   GLUCAP 289 (H) 10/15/2020   HGBA1C 7.2 (H) 10/14/2020    Review of Glycemic Control Results for Gregory Mitchell, Gregory Mitchell (MRN [id number]) as of 10/15/2020 11:13  Ref. Range 10/14/2020 07:28 10/14/2020 12:04 10/14/2020 17:14 10/14/2020 20:35 10/15/2020 07:27  Glucose-Capillary Latest Ref Range: 70 - 99 mg/dL 182 (H) 157 (H) 229 (H) 281 (H) 289 (H)    Diabetes history: DM 2 Outpatient Diabetes medications: Farxiga 10 mg Daily, Metformin 1000 mg bid Current orders for Inpatient glycemic control:  Farxiga 10 mg Daily Novolog 0-6 units tid  Solumedrol 40 mg Q12 Creat/BUN: 1.49/39  Inpatient Diabetes Program Recommendations:    -  Start Levemir 10 units -  Add Novolog hs scale  Thanks,  Tama Headings RN, MSN, BC-ADM Inpatient Diabetes Coordinator Team Pager [phone] (8a-5p)

## 2020-10-15 NOTE — Evaluation (Signed)
Clinical/Bedside Swallow Evaluation Patient Details  Name: Gregory Mitchell MRN: [id number] Date of Birth: [birth year]  Today's Date: 10/15/2020 Time: SLP Start Time (ACUTE ONLY): 1013 SLP Stop Time (ACUTE ONLY): 1028 SLP Time Calculation (min) (ACUTE ONLY): 15.78 min  Past Medical History:  Past Medical History:  Diagnosis Date  . Asthma   . Chronic renal insufficiency   . COPD (chronic obstructive pulmonary disease) ([place])   . Heart failure (HCC)    Acute on chronic combined systolic and diastolic heart failure  . Hypertension   . Pulmonary emboli ([place]) 03/2019  . Renal insufficiency   . Type 2 diabetes mellitus ([place])    Past Surgical History:  Past Surgical History:  Procedure Laterality Date  . APPENDECTOMY    . BALLOON DILATION N/A 03/18/2020   Procedure: BALLOON DILATION;  Surgeon: Eloise Harman, DO;  Location: [facility];  Service: Endoscopy;  Laterality: N/A;  . CHOLECYSTECTOMY    . COLONOSCOPY  10/2006   Dr.Anwar:normal  . ESOPHAGOGASTRODUODENOSCOPY (EGD) WITH PROPOFOL N/A 03/18/2020   Procedure: ESOPHAGOGASTRODUODENOSCOPY (EGD) WITH PROPOFOL;  Surgeon: Eloise Harman, DO;  Location: [facility];  Service: Endoscopy;  Laterality: N/A;  2:30pm  . NASAL SINUS SURGERY    . NEPHRECTOMY     RIGHT (Question CA.  No further therapy needed.)  . RIGHT/LEFT HEART CATH AND CORONARY ANGIOGRAPHY N/A 09/03/2019   Procedure: RIGHT/LEFT HEART CATH AND CORONARY ANGIOGRAPHY;  Surgeon: Lorretta Harp, MD;  Location: [facility];  Service: Cardiovascular;  Laterality: N/A;   HPI:  Pt is an 80 y.o. male with medical history significant for COPD with chronic 2 L/min supplemental oxygen requirement, chronic kidney disease, history of PE on Eliquis, chronic combined systolic and diastolic CHF, type 2 diabetes mellitus, and recent admission with COVID-19, discharged home on 2/28 and returned to the ED with increased chest pain, severely increased shortness of breath, and worsening  productive cough. CXR 3/9: Ill-defined airspace opacity in the lung bases is stable. No new opacity evident.   Assessment / Plan / Recommendation Clinical Impression  Pt was seen for bedside swallow evaluation and he denied a history of dysphagia. Oral mechanism exam was limited due to pt's difficulty following commands; however, reduced right-sided facial movement was noted. Full dentures were in place. RR was low to mid 20s and SpO2 low 90s during the evaluation. He tolerated all solids and liquids without signs or symptoms of oropharyngeal dysphagia. A regular texture diet with thin liquids is recommended at this time and SLP will follow briefly to ensure tolerance of the upgraded diet. SLP Visit Diagnosis: Dysphagia, unspecified (R13.10)    Aspiration Risk  No limitations    Diet Recommendation Regular;Thin liquid   Liquid Administration via: Cup;Straw Medication Administration: Whole meds with liquid Supervision: Staff to assist with self feeding Compensations: Minimize environmental distractions Postural Changes: Seated upright at 90 degrees    Other  Recommendations Oral Care Recommendations: Oral care BID   Follow up Recommendations None      Frequency and Duration min 1 x/week  1 week       Prognosis        Swallow Study   General Date of Onset: 10/14/20 HPI: Pt is an 80 y.o. male with medical history significant for COPD with chronic 2 L/min supplemental oxygen requirement, chronic kidney disease, history of PE on Eliquis, chronic combined systolic and diastolic CHF, type 2 diabetes mellitus, and recent admission with COVID-19, discharged home on 2/28 and returned to  the ED with increased chest pain, severely increased shortness of breath, and worsening productive cough. CXR 3/9: Ill-defined airspace opacity in the lung bases is stable. No new opacity evident. Type of Study: Bedside Swallow Evaluation Previous Swallow Assessment: None Diet Prior to this Study: Dysphagia  1 (puree);Thin liquids Temperature Spikes Noted: No Respiratory Status: Nasal cannula History of Recent Intubation: No Behavior/Cognition: Alert;Cooperative;Pleasant mood Oral Cavity Assessment: Within Functional Limits Oral Care Completed by SLP: No Oral Cavity - Dentition: Dentures, bottom;Dentures, top Vision: Functional for self-feeding Self-Feeding Abilities: Needs assist Patient Positioning: Upright in bed;Postural control adequate for testing Baseline Vocal Quality: Normal Volitional Cough: Weak Volitional Swallow: Able to elicit    Oral/Motor/Sensory Function Overall Oral Motor/Sensory Function: Mild impairment Facial ROM: Reduced right Facial Symmetry: Abnormal symmetry right;Suspected CN VII (facial) dysfunction   Ice Chips Ice chips: Within functional limits Presentation: Spoon   Thin Liquid Thin Liquid: Within functional limits Presentation: Straw;Cup    Nectar Thick Nectar Thick Liquid: Not tested   Honey Thick Honey Thick Liquid: Not tested   Puree Puree: Within functional limits Presentation: Spoon   Solid     Solid: Within functional limits Presentation: [place] [address] [place]in Negus, [place], [place] Office number [phone] Pager [place] 10/15/2020,10:29 AM

## 2020-10-15 NOTE — Plan of Care (Signed)

## 2020-10-15 NOTE — Evaluation (Signed)
Occupational Therapy Evaluation Patient Details Name: Gregory Mitchell MRN: [id number] DOB: Feb 21, 1941 Today's Date: 10/15/2020    History of Present Illness Gregory Mitchell is a 80 y.o. male with medical history significant for COPD with chronic 2 L/min supplemental oxygen requirement, chronic kidney disease 3 a, history of PE on Eliquis, chronic combined systolic and diastolic CHF, type 2 diabetes mellitus, and recent admission with COVID-19, discharged home on 10/06/2020 and now returning to the emergency department with increased chest pain, severely increased shortness of breath, and worsening productive cough   Clinical Impression   PTA, pt lives with children and reports typically independent with ADLs and mobility with occasional use of a cane. Pt reports able to make his own meals at home and drives. Pt presents now with diagnoses above and deficits in strength, standing balance, cardiopulmonary tolerance and cognition. Pt with decreased awareness of balance deficits, requiring Min A for short mobility without AD. With use of RW for mobility, pt progressed to min guard with improved stability noted. Pt requires Setup for UB ADLs and up to Mod A for LB ADLs due to deficits. Pt completed session on baseline 2 L O2 with mild SOB (variable, unreliable SpO2 readings despite probe changes) that subsided with seated rest break and pursed lip breathing. Pt would benefit from further skilled OT services to reinforce energy conservation strategies, maximize ADL independence and decrease fall risk prior to return home.     Follow Up Recommendations  Home health OT;Supervision/Assistance - 24 hour (24/7 initially, pending progress)    Equipment Recommendations  Other (comment) Librarian, academic)    Recommendations for Other Services       Precautions / Restrictions Precautions Precautions: Fall;Other (comment) Precaution Comments: monitor O2 Restrictions Weight Bearing Restrictions: No       Mobility Bed Mobility Overal bed mobility: Needs Assistance Bed Mobility: Supine to Sit;Sit to Supine     Supine to sit: Supervision;HOB elevated Sit to supine: Supervision   General bed mobility comments: Supervision for safety with lines, no assist given but does require increased time and use of bedrail    Transfers Overall transfer level: Needs assistance Equipment used: Rolling walker (2 wheeled);1 person hand held assist Transfers: Sit to/from Stand Sit to Stand: Min guard         General transfer comment: min guard for sit to stand without AD and subsequent trial of RW after balance deficits noted. Min A via handheld assist due to pt reaching out for support. Stability improved with use of RW    Balance Overall balance assessment: Needs assistance Sitting-balance support: No upper extremity supported;Feet supported Sitting balance-Leahy Scale: Fair     Standing balance support: No upper extremity supported;During functional activity Standing balance-Leahy Scale: Fair Standing balance comment: fair static standing but reaching out for UE support during mobility without AD. Improved balance with use of RW                           ADL either performed or assessed with clinical judgement   ADL Overall ADL's : Needs assistance/impaired Eating/Feeding: Set up;Sitting   Grooming: Set up;Sitting   Upper Body Bathing: Set up;Sitting   Lower Body Bathing: Minimal assistance;Sit to/from stand   Upper Body Dressing : Set up;Sitting   Lower Body Dressing: Moderate assistance;Sit to/from stand   Toilet Transfer: Min guard;Ambulation;RW   Toileting- Clothing Manipulation and Hygiene: Minimal assistance;Sit to/from stand       Functional mobility  during ADLs: Min guard;Rolling walker;Cueing for sequencing;Cueing for safety General ADL Comments: Pt with poor dynamic standing balance without UE support, improved stability with trial of RW. Pt endorses SOB  with minimal activity (variable O2 readings, unreliable pleth) on 2 L O2.     Vision Patient Visual Report: No change from baseline Vision Assessment?: No apparent visual deficits     Perception     Praxis      Pertinent Vitals/Pain Pain Assessment: No/denies pain     Hand Dominance Right   Extremity/Trunk Assessment Upper Extremity Assessment Upper Extremity Assessment: Overall WFL for tasks assessed   Lower Extremity Assessment Lower Extremity Assessment: Defer to PT evaluation   Cervical / Trunk Assessment Cervical / Trunk Assessment: Kyphotic   Communication Communication Communication: No difficulties   Cognition Arousal/Alertness: Awake/alert Behavior During Therapy: Flat affect Overall Cognitive Status: No family/caregiver present to determine baseline cognitive functioning Area of Impairment: Safety/judgement;Awareness;Problem solving                         Safety/Judgement: Decreased awareness of deficits Awareness: Emergent Problem Solving: Slow processing;Difficulty sequencing;Requires verbal cues;Requires tactile cues General Comments: Pt with cues needed for sequencing, decreased awareness of balance deficits as he declined need for AD initially despite reaching out for support. Follows directions, flat affect throughout   General Comments  HR WFL, Pt completed session on baseline 2 L O2. Unreliable pleth/variable O2 readings noted throughout activity, trialed with ear probe and continued variable readings noted. Pt endorsed mild SOB with mobility in room, 2/4 DOE observed that normalized quickly with seated rest break and pursed lip breathing.    Exercises     Shoulder Instructions      Home Living Family/patient expects to be discharged to:: Private residence Living Arrangements: Children Available Help at Discharge: Family;Available 24 hours/day Type of Home: House Home Access: Stairs to enter [employer] of Steps: 2   Home  Layout: One level     Bathroom Shower/Tub: Teacher, early years/pre: Standard     Home Equipment: Shower seat;Hand held shower head;Grab bars - tub/shower;Grab bars - toilet;Cane - single point   Additional Comments: reports he has a walker but it is old - would need a new one. Reports his children do not work so 24/7 available      Prior Functioning/Environment Level of Independence: Independent        Comments: occasionally uses cane though not usually. can do ADLs without assist, drives, able to make simple meals. family assists with laundry, etc        OT Problem List: Decreased strength;Decreased activity tolerance;Impaired balance (sitting and/or standing);Cardiopulmonary status limiting activity;Decreased knowledge of use of DME or AE;Decreased cognition;Decreased safety awareness      OT Treatment/Interventions: Self-care/ADL training;Therapeutic exercise;Energy conservation;DME and/or AE instruction;Therapeutic activities;Balance training;Patient/family education    OT Goals(Current goals can be found in the care plan section) Acute Rehab OT Goals Patient Stated Goal: go home soon OT Goal Formulation: With patient Time For Goal Achievement: 10/29/20 Potential to Achieve Goals: Good ADL Goals Pt Will Perform Grooming: with modified independence;standing Pt Will Perform Lower Body Dressing: with modified independence;sitting/lateral leans;sit to/from stand Pt Will Transfer to Toilet: with modified independence;ambulating Pt Will Perform Tub/Shower Transfer: Tub transfer;with set-up;ambulating;shower seat;rolling walker Additional ADL Goal #1: Pt to verbalize at least 2 energy conservation strategies to implement during ADLs/IADLs and mobility at home Additional ADL Goal #2: Pt to independently monitor SpO2 and implement pursed  lip breathing when needed to maintain SpO2 >90%  OT Frequency: Min 2X/week   Barriers to D/C:            Co-evaluation               AM-PAC OT "6 Clicks" Daily Activity     Outcome Measure Help from another person eating meals?: A Little Help from another person taking care of personal grooming?: A Little Help from another person toileting, which includes using toliet, bedpan, or urinal?: A Little Help from another person bathing (including washing, rinsing, drying)?: A Little Help from another person to put on and taking off regular upper body clothing?: A Little Help from another person to put on and taking off regular lower body clothing?: A Lot 6 Click Score: 17   End of Session Equipment Utilized During Treatment: Gait belt;Rolling walker;Oxygen Nurse Communication: Mobility status  Activity Tolerance: Patient tolerated treatment well Patient left: in bed;with call bell/phone within reach;with bed alarm set  OT Visit Diagnosis: Unsteadiness on feet (R26.81);Other abnormalities of gait and mobility (R26.89);Muscle weakness (generalized) (M62.81)                Time: 8616-8372 OT Time Calculation (min): 26 min Charges:  OT General Charges $OT Visit: 1 Visit OT Evaluation $OT Eval Moderate Complexity: 1 Mod OT Treatments $Therapeutic Activity: 8-22 mins  Malachy Chamber, OTR/L Acute Rehab Services Office: [phone]  Layla Maw 10/15/2020, 11:37 AM

## 2020-10-15 NOTE — Progress Notes (Signed)
Patient's daughter brought the bottom teeth. It is on the patient's bed side table. Day shift Nurse aware.

## 2020-10-15 NOTE — Evaluation (Signed)
Physical Therapy Evaluation Patient Details Name: Gregory Mitchell MRN: [id number] DOB: Jan 01, 1941 Today's Date: 10/15/2020   History of Present Illness  Gregory Mitchell is a 80 y.o. male with medical history significant for COPD with chronic 2 L/min supplemental oxygen requirement, chronic kidney disease 3 a, history of PE on Eliquis, chronic combined systolic and diastolic CHF, type 2 diabetes mellitus, and recent admission with COVID-19, discharged home on 10/06/2020 and now returning to the emergency department with increased chest pain, severely increased shortness of breath, and worsening productive cough  Clinical Impression  Pt lives with son and daughter in-law in single story home with 2 steps to enter. Prior to COVID diagnosis last month pt reports using a cane on rare occassions with community level walking, complete independence with bADLs and assisting with meal prep at home, driving to the store. Pt is currently limited in safe mobility by slowed processing and decreased safety with RW management in close quarters in presence of generalized weakness and decreased balance. Pt is currently supervision for bed mobility, min guard for transfers and light min A for steadying with RW, especially in tight spaces. PT recommending return home with initial 24 hour care and HHPT to improve strength and balance. PT will continue to follow acutely.     Follow Up Recommendations Home health PT;Supervision/Assistance - 24 hour    Equipment Recommendations  Rolling walker with 5" wheels    Recommendations for Other Services       Precautions / Restrictions Precautions Precautions: Fall;Other (comment) Precaution Comments: monitor O2 Restrictions Weight Bearing Restrictions: No      Mobility  Bed Mobility Overal bed mobility: Needs Assistance Bed Mobility: Supine to Sit;Sit to Supine     Supine to sit: Supervision;HOB elevated Sit to supine: Supervision   General bed mobility comments:  Supervision for safety with lines, no assist given but does require increased time and use of bedrail    Transfers Overall transfer level: Needs assistance Equipment used: Rolling walker (2 wheeled);1 person hand held assist Transfers: Sit to/from Stand Sit to Stand: Min guard         General transfer comment: min guard for sit to stand from low bed and BSC over toilet, steadies himself with RW  Ambulation/Gait Ambulation/Gait assistance: Min assist;Min guard Gait Distance (Feet): 15 Feet (2x15) Assistive device: Rolling walker (2 wheeled) Gait Pattern/deviations: Step-through pattern;Decreased step length - right;Decreased step length - left;Shuffle;Trunk flexed Gait velocity: slowed Gait velocity interpretation: <1.31 ft/sec, indicative of household ambulator General Gait Details: light min A for steadying with RW in turning in bathroom, decreased safety awareness with RW entering/exiting bathroom, otherwise min guard         Balance Overall balance assessment: Needs assistance Sitting-balance support: No upper extremity supported;Feet supported Sitting balance-Leahy Scale: Fair     Standing balance support: No upper extremity supported;During functional activity Standing balance-Leahy Scale: Fair Standing balance comment: fair static standing but reaching out for UE support during mobility without AD. Improved balance with use of RW                             Pertinent Vitals/Pain Pain Assessment: No/denies pain    Home Living Family/patient expects to be discharged to:: Private residence Living Arrangements: Children Available Help at Discharge: Family;Available 24 hours/day Type of Home: House Home Access: Stairs to enter   [employer] of Steps: 2 Home Layout: One level Home Equipment: Shower seat;Hand held shower  head;Grab bars - tub/shower;Grab bars - toilet;Cane - single point Additional Comments: reports he has a walker but it is old  - would need a new one. Reports his children do not work so 24/7 available    Prior Function Level of Independence: Independent         Comments: occasionally uses cane though not usually. can do ADLs without assist, drives, able to make simple meals. family assists with laundry, etc     Hand Dominance   Dominant Hand: Right    Extremity/Trunk Assessment   Upper Extremity Assessment Upper Extremity Assessment: Generalized weakness    Lower Extremity Assessment Lower Extremity Assessment: Generalized weakness    Cervical / Trunk Assessment Cervical / Trunk Assessment: Kyphotic  Communication   Communication: No difficulties  Cognition Arousal/Alertness: Awake/alert Behavior During Therapy: Flat affect Overall Cognitive Status: No family/caregiver present to determine baseline cognitive functioning Area of Impairment: Safety/judgement;Awareness;Problem solving                         Safety/Judgement: Decreased awareness of deficits Awareness: Emergent Problem Solving: Slow processing;Difficulty sequencing;Requires verbal cues;Requires tactile cues General Comments: Pt with cues needed for sequencing, decreased awareness of balance deficits as he declined need for AD initially despite reaching out for support. Follows directions, flat affect throughout      General Comments General comments (skin integrity, edema, etc.): VSS on 2L O2, 2/4 DoE with in room ambulation        Assessment/Plan    PT Assessment Patient needs continued PT services  PT Problem List Decreased strength;Decreased activity tolerance;Decreased balance;Decreased mobility;Decreased cognition;Decreased knowledge of use of DME;Decreased safety awareness       PT Treatment Interventions DME instruction;Gait training;Stair training;Functional mobility training;Therapeutic activities;Therapeutic exercise;Balance training;Cognitive remediation;Patient/family education    PT Goals (Current  goals can be found in the Care Plan section)  Acute Rehab PT Goals Patient Stated Goal: go home soon PT Goal Formulation: With patient Time For Goal Achievement: 10/29/20 Potential to Achieve Goals: Good    Frequency Min 3X/week    AM-PAC PT "6 Clicks" Mobility  Outcome Measure Help needed turning from your back to your side while in a flat bed without using bedrails?: None Help needed moving from lying on your back to sitting on the side of a flat bed without using bedrails?: None Help needed moving to and from a bed to a chair (including a wheelchair)?: None Help needed standing up from a chair using your arms (e.g., wheelchair or bedside chair)?: None Help needed to walk in hospital room?: A Little Help needed climbing 3-5 steps with a railing? : A Little 6 Click Score: 22    End of Session Equipment Utilized During Treatment: Gait belt;Oxygen Activity Tolerance: Patient tolerated treatment well Patient left: in chair;with call bell/phone within reach;with nursing/sitter in room;with chair alarm set Nurse Communication: Mobility status (use of bathroom) PT Visit Diagnosis: Unsteadiness on feet (R26.81);Other abnormalities of gait and mobility (R26.89);Muscle weakness (generalized) (M62.81)    Time: 0350-0938 PT Time Calculation (min) (ACUTE ONLY): 31 min   Charges:   PT Evaluation $PT Eval Moderate Complexity: 1 Mod PT Treatments $Therapeutic Activity: 8-22 mins        Yannet Rincon B. Migdalia Dk PT, DPT Acute Rehabilitation Services Pager [phone] Office [phone]   [place] 10/15/2020, 1:51 PM

## 2020-10-16 DIAGNOSIS — I5042 Chronic combined systolic (congestive) and diastolic (congestive) heart failure: Secondary | ICD-10-CM | POA: Diagnosis not present

## 2020-10-16 DIAGNOSIS — J159 Unspecified bacterial pneumonia: Secondary | ICD-10-CM | POA: Diagnosis not present

## 2020-10-16 DIAGNOSIS — N1831 Chronic kidney disease, stage 3a: Secondary | ICD-10-CM | POA: Diagnosis not present

## 2020-10-16 DIAGNOSIS — J9621 Acute and chronic respiratory failure with hypoxia: Secondary | ICD-10-CM | POA: Diagnosis not present

## 2020-10-16 LAB — GLUCOSE, CAPILLARY
Glucose-Capillary: 295 mg/dL — ABNORMAL HIGH (ref 70–99)
Glucose-Capillary: 180 mg/dL — ABNORMAL HIGH (ref 70–99)

## 2020-10-16 LAB — COMPREHENSIVE METABOLIC PANEL
ALT: 66 U/L — ABNORMAL HIGH (ref 0–44)
AST: 57 U/L — ABNORMAL HIGH (ref 15–41)
Albumin: 1.8 g/dL — ABNORMAL LOW (ref 3.5–5.0)
Alkaline Phosphatase: 46 U/L (ref 38–126)
Anion gap: 5 (ref 5–15)
BUN: 48 mg/dL — ABNORMAL HIGH (ref 8–23)
CO2: 33 mmol/L — ABNORMAL HIGH (ref 22–32)
Calcium: 8.7 mg/dL — ABNORMAL LOW (ref 8.9–10.3)
Chloride: 98 mmol/L (ref 98–111)
Creatinine, Ser: 1.41 mg/dL — ABNORMAL HIGH (ref 0.61–1.24)
GFR, Estimated: 50 mL/min — ABNORMAL LOW (ref >60)
Glucose, Bld: 278 mg/dL — ABNORMAL HIGH (ref 70–99)
Potassium: 5.1 mmol/L (ref 3.5–5.1)
Sodium: 136 mmol/L (ref 135–145)
Total Bilirubin: 0.4 mg/dL (ref 0.3–1.2)
Total Protein: 5 g/dL — ABNORMAL LOW (ref 6.5–8.1)

## 2020-10-16 LAB — CBC WITH DIFFERENTIAL/PLATELET
Abs Immature Granulocytes: 0.1 10*3/uL — ABNORMAL HIGH (ref 0.00–0.07)
Basophils Absolute: 0 10*3/uL (ref 0.0–0.1)
Basophils Relative: 0 %
Eosinophils Absolute: 0 10*3/uL (ref 0.0–0.5)
Eosinophils Relative: 0 %
HCT: 33.1 % — ABNORMAL LOW (ref 39.0–52.0)
Hemoglobin: 10.2 g/dL — ABNORMAL LOW (ref 13.0–17.0)
Immature Granulocytes: 1 %
Lymphocytes Relative: 4 %
Lymphs Abs: 0.5 10*3/uL — ABNORMAL LOW (ref 0.7–4.0)
MCH: 27.4 pg (ref 26.0–34.0)
MCHC: 30.8 g/dL (ref 30.0–36.0)
MCV: 89 fL (ref 80.0–100.0)
Monocytes Absolute: 0.2 10*3/uL (ref 0.1–1.0)
Monocytes Relative: 1 %
Neutro Abs: 13.3 10*3/uL — ABNORMAL HIGH (ref 1.7–7.7)
Neutrophils Relative %: 94 %
Platelets: 297 10*3/uL (ref 150–400)
RBC: 3.72 MIL/uL — ABNORMAL LOW (ref 4.22–5.81)
RDW: 15.6 % — ABNORMAL HIGH (ref 11.5–15.5)
WBC: 14.2 10*3/uL — ABNORMAL HIGH (ref 4.0–10.5)
nRBC: 0 % (ref 0.0–0.2)

## 2020-10-16 LAB — D-DIMER, QUANTITATIVE: D-Dimer, Quant: 0.41 ug/mL-FEU (ref 0.00–0.50)

## 2020-10-16 LAB — C-REACTIVE PROTEIN: CRP: 8.7 mg/dL — ABNORMAL HIGH (ref <1.0)

## 2020-10-16 MED ORDER — BENZONATATE 100 MG PO CAPS: 100.0000 mg | ORAL_CAPSULE | Freq: Three times a day (TID) | ORAL | 0 refills | Status: DC | PRN

## 2020-10-16 MED ORDER — CEFDINIR 300 MG PO CAPS: 300.0000 mg | ORAL_CAPSULE | Freq: Two times a day (BID) | ORAL | 0 refills | Status: AC

## 2020-10-16 MED ORDER — PREDNISONE 10 MG PO TABS: ORAL_TABLET | ORAL | 0 refills | Status: DC

## 2020-10-16 MED ORDER — BISACODYL 10 MG RE SUPP
10.0000 mg | Freq: Every day | RECTAL | Status: DC | PRN
  Administered 2020-10-16: 10 mg via RECTAL
  Filled 2020-10-16: qty 1

## 2020-10-16 MED ORDER — TAMSULOSIN HCL 0.4 MG PO CAPS: 0.4000 mg | ORAL_CAPSULE | Freq: Every day | ORAL | 0 refills | Status: DC

## 2020-10-16 NOTE — Discharge Summary (Signed)
PATIENT DETAILS Name: Gregory Mitchell Age: 80 y.o. Sex: male Date of Birth: Apr 13, 1941 MRN: [id number]. Admitting Physician: Vianne Bulls, MD DGL:[id number], Samul Dada, MD  Admit Date: 10/14/2020 Discharge date: 10/16/2020  Recommendations for Outpatient Follow-up:  1. Follow up with PCP in 1-2 weeks 2. Please obtain CMP/CBC in one week 3. Repeat Chest Xray in 4-6 week   Admitted From:  Home  Disposition: Home with home health [place]: Yes  Equipment/Devices: None  Discharge Condition: Stable  CODE STATUS: FULL CODE  Diet recommendation:  Diet Order            Diet - low sodium heart healthy           Diet Carb Modified           Diet heart healthy/carb modified Room service appropriate? Yes; Fluid consistency: Thin  Diet effective now                 Brief Narrative: Patient is a 80 y.o. male with PMHx of COPD on 2 L of oxygen at home, CKD stage IIIa, PE on Eliquis, chronic combined systolic and diastolic heart failure, [id number] hospitalized at [facility] from 2/26-2/28 for COVID-19 pneumonia-presenting with worsening shortness of breath thought to be due to superimposed bacterial pneumonia.  See below for further details   Significant Events: 2/26-2/28>> hospitalized at [facility] for acute on chronic hypoxic resp failure due to COVID-19 pneumonia 3/7>> transfer from [facility]-worsening shortness of breath-severely hypoxic requiring BiPAP-thought to have bacterial pneumonia.    Significant studies: 3/8>> chest x-ray: Ill-defined opacity in bilateral lung bases  COVID-19 medications: None  Antibiotics: Rocephin: 3/7>>3/10 Zithromax: 3/7>>3/10  Microbiology data: None  Procedures: 09/03/2019>> LHC: Normal coronaries 2/27//2022>> Echo: EF 63-01%, grade 3 diastolic dysfunction  Consults: None   Brief Hospital Course: Acute on chronic hypoxic respiratory failure (on 2 L at home) due to bacterial pneumonia+/-lingering COVID-19  pneumonitis:  Overall significantly better with IV steroids/IV antimicrobial therapy-he is back down to his usual 2 L of oxygen since yesterday.  He is adamant on being discharged home today-he feels much better and he is back to his baseline.  On discharge-we will transition to tapering steroids and oral antibiotics for a few more days.  Follow with PCP-repeat two-view chest x-ray in 4 to 6 weeks to document resolution of pneumonia.  COVID-19 Labs:  Recent Labs    10/15/20 0036 10/16/20 0324  DDIMER 0.73* 0.41  CRP 17.2* 8.7*    Lab Results  Component Value Date   SARSCOV2NAA POSITIVE (A) 10/04/2020   SARSCOV2NAA NEGATIVE 07/06/2020   [place] NEGATIVE 03/17/2020   [place] NEGATIVE 08/31/2019     COPD exacerbation: Better-no wheezing-continue bronchodilators-steroid taper on discharge.  Chronic combined systolic and diastolic heart failure: Volume status stable-continue Entresto/Coreg/Lasix.    Resume Farxiga on discharge.  AKI on CKD stage IIIa:  AKI improving-creatinine downtrending and getting close to his baseline.  Repeat electrolytes in 1 week.   Hyperkalemia: Resolved.  DM-2 (A1c 7.2 on 3/8) with uncontrolled hyperglycemia due to steroids:  Required Lantus/SSI during this hospital stay-plans are to transition to oral hypoglycemic agents on discharge.    History of PE: Continue Eliquis  Minimally elevated troponin: Secondary demand ischemia-not consistent with ACS.  Bilateral chest pain: Has resolved-likely musculoskeletal in etiology.  LHC in January 2021 was negative.  Already on full dose anticoagulation.  Very low suspicion for PE.  Acute urine retention-history of BPH: Apparently had Foley catheter placed at outside  hospital before transfer-Per family-able to urinate on his own-Foley catheter discontinued-voiding trial successful on 3/9.  Continue Flomax.  Chronic debility/deconditioning: Appreciate PT/OT eval-plans are for home health on  discharge.  Obesity: Estimated body mass index is 21.88 kg/m as calculated from the following:   Height as of this encounter: 5\' 6"  (1.676 m).   Weight as of this encounter: 61.5 kg.   RN pressure injury documentation: Pressure Injury 10/14/20 Sacrum Medial Stage 2 -  Partial thickness loss of dermis presenting as a shallow open injury with a red, pink wound bed without slough. 2x2 cm (Active)  10/14/20 0045  Location: Sacrum  Location Orientation: Medial  Staging: Stage 2 -  Partial thickness loss of dermis presenting as a shallow open injury with a red, pink wound bed without slough.  Wound Description (Comments): 2x2 cm  Present on Admission: Yes    Discharge Diagnoses:  Principal Problem:   Bacterial pneumonia Active Problems:   Chest pain   COPD (chronic obstructive pulmonary disease) (HCC)   Acute on chronic respiratory failure with hypoxia (HCC)   COVID-19 virus infection   Chronic kidney disease, stage 3a (HCC)   Chronic combined systolic and diastolic CHF (congestive heart failure) (HCC)   History of pulmonary embolism   Elevated troponin   Left lower lobe pneumonia   Pressure injury of skin   Discharge Instructions:    Person Under Monitoring Name: Gregory Mitchell  Location: [place] [id number]   Infection Prevention Recommendations for Individuals Confirmed to have, or Being Evaluated for, 2019 Novel Coronavirus (COVID-19) Infection Who Receive Care at Home  Individuals who are confirmed to have, or are being evaluated for, COVID-19 should follow the prevention steps below until a healthcare provider or local or state health department says they can return to normal activities.  Stay home except to get medical care You should restrict activities outside your home, except for getting medical care. Do not go to work, school, or public areas, and do not use public transportation or taxis.  Call ahead before visiting your doctor Before  your medical appointment, call the healthcare provider and tell them that you have, or are being evaluated for, COVID-19 infection. This will help the healthcare provider's office take steps to keep other people from getting infected. Ask your healthcare provider to call the local or state health department.  Monitor your symptoms Seek prompt medical attention if your illness is worsening (e.g., difficulty breathing). Before going to your medical appointment, call the healthcare provider and tell them that you have, or are being evaluated for, COVID-19 infection. Ask your healthcare provider to call the local or state health department.  Wear a facemask You should wear a facemask that covers your nose and mouth when you are in the same room with other people and when you visit a healthcare provider. People who live with or visit you should also wear a facemask while they are in the same room with you.  Separate yourself from other people in your home As much as possible, you should stay in a different room from other people in your home. Also, you should use a separate bathroom, if available.  Avoid sharing household items You should not share dishes, drinking glasses, cups, eating utensils, towels, bedding, or other items with other people in your home. After using these items, you should wash them thoroughly with soap and water.  Cover your coughs and sneezes Cover your mouth and nose with a tissue when  you cough or sneeze, or you can cough or sneeze into your sleeve. Throw used tissues in a lined trash can, and immediately wash your hands with soap and water for at least 20 seconds or use an alcohol-based hand rub.  Wash your [facility] your hands often and thoroughly with soap and water for at least 20 seconds. You can use an alcohol-based hand sanitizer if soap and water are not available and if your hands are not visibly dirty. Avoid touching your eyes, nose, and mouth with  unwashed hands.   Prevention Steps for Caregivers and Household Members of Individuals Confirmed to have, or Being Evaluated for, COVID-19 Infection Being Cared for in the Home  If you live with, or provide care at home for, a person confirmed to have, or being evaluated for, COVID-19 infection please follow these guidelines to prevent infection:  Follow healthcare provider's instructions Make sure that you understand and can help the patient follow any healthcare provider instructions for all care.  Provide for the patient's basic needs You should help the patient with basic needs in the home and provide support for getting groceries, prescriptions, and other personal needs.  Monitor the patient's symptoms If they are getting sicker, call his or her medical provider and tell them that the patient has, or is being evaluated for, COVID-19 infection. This will help the healthcare provider's office take steps to keep other people from getting infected. Ask the healthcare provider to call the local or state health department.  Limit the number of people who have contact with the patient  If possible, have only one caregiver for the patient.  Other household members should stay in another home or place of residence. If this is not possible, they should stay  in another room, or be separated from the patient as much as possible. Use a separate bathroom, if available.  Restrict visitors who do not have an essential need to be in the home.  Keep older adults, very young children, and other sick people away from the patient Keep older adults, very young children, and those who have compromised immune systems or chronic health conditions away from the patient. This includes people with chronic heart, lung, or kidney conditions, diabetes, and cancer.  Ensure good ventilation Make sure that shared spaces in the home have good air flow, such as from an air conditioner or an opened  window, weather permitting.  Wash your hands often  Wash your hands often and thoroughly with soap and water for at least 20 seconds. You can use an alcohol based hand sanitizer if soap and water are not available and if your hands are not visibly dirty.  Avoid touching your eyes, nose, and mouth with unwashed hands.  Use disposable paper towels to dry your hands. If not available, use dedicated cloth towels and replace them when they become wet.  Wear a facemask and gloves  Wear a disposable facemask at all times in the room and gloves when you touch or have contact with the patient's blood, body fluids, and/or secretions or excretions, such as sweat, saliva, sputum, nasal mucus, vomit, urine, or feces.  Ensure the mask fits over your nose and mouth tightly, and do not touch it during use.  Throw out disposable facemasks and gloves after using them. Do not reuse.  Wash your hands immediately after removing your facemask and gloves.  If your personal clothing becomes contaminated, carefully remove clothing and launder. Wash your hands after handling contaminated clothing.  Place all used disposable facemasks, gloves, and other waste in a lined container before disposing them with other household waste.  Remove gloves and wash your hands immediately after handling these items.  Do not share dishes, glasses, or other household items with the patient  Avoid sharing household items. You should not share dishes, drinking glasses, cups, eating utensils, towels, bedding, or other items with a patient who is confirmed to have, or being evaluated for, COVID-19 infection.  After the person uses these items, you should wash them thoroughly with soap and water.  Wash laundry thoroughly  Immediately remove and wash clothes or bedding that have blood, body fluids, and/or secretions or excretions, such as sweat, saliva, sputum, nasal mucus, vomit, urine, or feces, on them.  Wear gloves when  handling laundry from the patient.  Read and follow directions on labels of laundry or clothing items and detergent. In general, wash and dry with the warmest temperatures recommended on the label.  Clean all areas the individual has used often  Clean all touchable surfaces, such as counters, tabletops, doorknobs, bathroom fixtures, toilets, phones, keyboards, tablets, and bedside tables, every day. Also, clean any surfaces that may have blood, body fluids, and/or secretions or excretions on them.  Wear gloves when cleaning surfaces the patient has come in contact with.  Use a diluted bleach solution (e.g., dilute bleach with 1 part bleach and 10 parts water) or a household disinfectant with a label that says EPA-registered for coronaviruses. To make a bleach solution at home, add 1 tablespoon of bleach to 1 quart (4 cups) of water. For a larger supply, add  cup of bleach to 1 gallon (16 cups) of water.  Read labels of cleaning products and follow recommendations provided on product labels. Labels contain instructions for safe and effective use of the cleaning product including precautions you should take when applying the product, such as wearing gloves or eye protection and making sure you have good ventilation during use of the product.  Remove gloves and wash hands immediately after cleaning.  Monitor yourself for signs and symptoms of illness Caregivers and household members are considered close contacts, should monitor their health, and will be asked to limit movement outside of the home to the extent possible. Follow the monitoring steps for close contacts listed on the symptom monitoring form.   ? If you have additional questions, contact your local health department or call the epidemiologist on call at [phone] (available 24/7). ? This guidance is subject to change. For the most up-to-date guidance from CDC, please refer to their  website: YouBlogs.pl    Activity:  As tolerated with Full fall precautions use walker/cane & assistance as needed  Discharge Instructions    (HEART FAILURE PATIENTS) Call MD:  Anytime you have any of the following symptoms: 1) 3 pound weight gain in 24 hours or 5 pounds in 1 week 2) shortness of breath, with or without a dry hacking cough 3) swelling in the hands, feet or stomach 4) if you have to sleep on extra pillows at night in order to breathe.   Complete by: As directed    Call MD for:  difficulty breathing, headache or visual disturbances   Complete by: As directed    Diet - low sodium heart healthy   Complete by: As directed    Diet Carb Modified   Complete by: As directed    Discharge instructions   Complete by: As directed    Follow with Primary MD  Neale Burly, MD in 1-2 weeks  Please get a complete blood count and chemistry panel checked by your Primary MD at your next visit, and again as instructed by your Primary MD.  Get Medicines reviewed and adjusted: Please take all your medications with you for your next visit with your Primary MD  Laboratory/radiological data: Please request your Primary MD to go over all hospital tests and procedure/radiological results at the follow up, please ask your Primary MD to get all Hospital records sent to his/her office.  In some cases, they will be blood work, cultures and biopsy results pending at the time of your discharge. Please request that your primary care M.D. follows up on these results.  Also Note the following: If you experience worsening of your admission symptoms, develop shortness of breath, life threatening emergency, suicidal or homicidal thoughts you must seek medical attention immediately by calling 911 or calling your MD immediately  if symptoms less severe.  You must read complete instructions/literature along with all the possible adverse  reactions/side effects for all the Medicines you take and that have been prescribed to you. Take any new Medicines after you have completely understood and accpet all the possible adverse reactions/side effects.   Do not drive when taking Pain medications or sleeping medications (Benzodaizepines)  Do not take more than prescribed Pain, Sleep and Anxiety Medications. It is not advisable to combine anxiety,sleep and pain medications without talking with your primary care practitioner  Special Instructions: If you have smoked or chewed Tobacco  in the last 2 yrs please stop smoking, stop any regular Alcohol  and or any Recreational drug use.  Wear Seat belts while driving.  Please note: You were cared for by a hospitalist during your hospital stay. Once you are discharged, your primary care physician will handle any further medical issues. Please note that NO REFILLS for any discharge medications will be authorized once you are discharged, as it is imperative that you return to your primary care physician (or establish a relationship with a primary care physician if you do not have one) for your post hospital discharge needs so that they can reassess your need for medications and monitor your lab values.   1.)Continue isolation for 21 days from 10/04/20   Increase activity slowly   Complete by: As directed    No dressing needed   Complete by: As directed      Allergies as of 10/16/2020      Reactions   Amoxicillin Other (See Comments)   Makes eyes turn yellow and burn   Ciprofloxacin    Pt not sure   Sulfa Antibiotics Rash   Pt not sure of reaction      Medication List    STOP taking these medications   HYDROcodone-acetaminophen 7.5-325 MG tablet Commonly known as: NORCO   mirtazapine 15 MG tablet Commonly known as: REMERON     TAKE these medications   albuterol 108 (90 Base) MCG/ACT inhaler Commonly known as: VENTOLIN HFA Inhale 2 puffs into the lungs every 6 (six) hours as  needed for wheezing or shortness of breath.   apixaban 5 MG Tabs tablet Commonly known as: ELIQUIS Take 5 mg by mouth 2 (two) times daily.   benzonatate 100 MG capsule Commonly known as: Tessalon Perles Take 1 capsule (100 mg total) by mouth 3 (three) times daily as needed for cough.   carvedilol 12.5 MG tablet Commonly known as: Coreg Take 1 tablet (12.5 mg total) by mouth 2 (  two) times daily.   cefdinir 300 MG capsule Commonly known as: OMNICEF Take 1 capsule (300 mg total) by mouth 2 (two) times daily for 2 days.   dapagliflozin propanediol 10 MG Tabs tablet Commonly known as: Farxiga Take 1 tablet (10 mg total) by mouth daily before breakfast.   diclofenac Sodium 1 % Gel Commonly known as: VOLTAREN Apply 1 application topically 4 (four) times daily as needed (pain).   Entresto 24-26 MG Generic drug: sacubitril-valsartan Take 1 tablet by mouth 2 (two) times daily.   furosemide 20 MG tablet Commonly known as: LASIX Take 20 mg by mouth 2 (two) times daily.   hydrALAZINE 10 MG tablet Commonly known as: APRESOLINE Take 10 mg by mouth 3 (three) times daily.   ipratropium-albuterol 0.5-2.5 (3) MG/3ML Soln Commonly known as: DUONEB Take 3 mLs by nebulization every 6 (six) hours as needed (for shortness of breath/wheezing).   metFORMIN 500 MG 24 hr tablet Commonly known as: GLUCOPHAGE-XR Take 1,000 mg by mouth 2 (two) times daily.   omeprazole 40 MG capsule Commonly known as: PRILOSEC Take 1 capsule (40 mg total) by mouth 2 (two) times daily before a meal. 30 minutes before breakfast and 30 minutes before dinner   OXYGEN Inhale 2 L into the lungs at bedtime as needed (for shortness of breath).   predniSONE 10 MG tablet Commonly known as: DELTASONE Take 40 mg daily for 1 day, 30 mg daily for 1 day, 20 mg daily for 1 days,10 mg daily for 1 day, then stop   Symbicort 160-4.5 MCG/ACT inhaler Generic drug: budesonide-formoterol Inhale 2 puffs into the lungs in the  morning and at bedtime.   tamsulosin 0.4 MG Caps capsule Commonly known as: FLOMAX Take 1 capsule (0.4 mg total) by mouth daily.   vitamin C 500 MG tablet Commonly known as: ASCORBIC ACID Take 500 mg by mouth daily.            Durable Medical Equipment  (From admission, onward)         Start     Ordered   10/15/20 1344  For home use only DME Walker rolling  Once       Question Answer Comment  Walker: With 5 Inch Wheels   Patient needs a walker to treat with the following condition Weakness      10/15/20 1343           Discharge Care Instructions  (From admission, onward)         Start     Ordered   10/16/20 0000  No dressing needed        10/16/20 1015          Follow-up Information    Hasanaj, Samul Dada, MD. Schedule an appointment as soon as possible for a visit in 1 week(s).   Specialty: Internal Medicine Contact information: [place] [id number] [phone]        Arnoldo Lenis, MD Follow up in 2 week(s).   Specialty: Cardiology Contact information: [place] [id number] [phone]              Allergies  Allergen Reactions  . Amoxicillin Other (See Comments)    Makes eyes turn yellow and burn  . Ciprofloxacin     Pt not sure  . Sulfa Antibiotics Rash    Pt not sure of reaction      Other Procedures/Studies: DG Chest Port 1 View  Result Date:  10/14/2020 CLINICAL DATA:  Chest pain and shortness of breath EXAM: PORTABLE CHEST 1 VIEW COMPARISON:  October 13, 2020 and October 04, 2020 FINDINGS: Areas of ill-defined airspace opacity in the lung bases persist without change. No new opacity evident. Heart is upper normal in size with pulmonary vascularity normal, stable. There is aortic atherosclerosis. No bone lesions. IMPRESSION: Ill-defined airspace opacity in the lung bases is stable. No new opacity evident. Stable cardiac silhouette. Electronically Signed   By: Lowella Grip III M.D.    On: 10/14/2020 08:20   DG Chest Portable 1 View  Result Date: 10/04/2020 CLINICAL DATA:  Shortness of breath history of congestive heart failure and COPD. EXAM: PORTABLE CHEST 1 VIEW COMPARISON:  July 17, 2020 FINDINGS: Enlarged cardiac silhouette. Tortuosity of the aorta. Mediastinal contours appear intact. Subtle diffuse peribronchial airspace opacities. No pleural effusion or pneumothorax. Osseous structures are without acute abnormality. Soft tissues are grossly normal. IMPRESSION: 1. Enlarged cardiac silhouette. 2. Subtle diffuse peribronchial airspace opacities may represent bronchitis or atypical pneumonia. 3. Tortuosity of the aorta. Electronically Signed   By: Fidela Salisbury M.D.   On: 10/04/2020 16:12   ECHOCARDIOGRAM COMPLETE  Result Date: 10/05/2020    ECHOCARDIOGRAM REPORT   Patient Name:   Gregory Mitchell ON Date of Exam: 10/05/2020 Medical Rec #:  [id number]      Height:       66.0 in Accession #:    [id number]     Weight:       145.0 lb Date of Birth:  1941-03-31      BSA:          1.744 m Patient Age:    17 years       BP:           123/75 mmHg Patient Gender: M              HR:           57 bpm. Exam Location:  Inpatient Procedure: 2D Echo, Limited Echo, Cardiac Doppler, Color Doppler and            Intracardiac Opacification Agent Indications:    CHF-Acute Systolic H20.94  History:        Patient has prior history of Echocardiogram examinations, most                 recent 08/31/2019. CHF, COPD, Signs/Symptoms:Shortness of Breath                 and Chest Pain; Risk Factors:Hypertension, Diabetes and Former                 Smoker. COVID 19. Chronic kidney disease.  Sonographer:    Darlina Sicilian RDCS Referring Phys: [place]  1. Left ventricular ejection fraction, by estimation, is 20 to 25%. The left ventricle has severely decreased function. The left ventricle demonstrates global hypokinesis. The left ventricular internal cavity size was severely dilated. Left  ventricular diastolic parameters are consistent with Grade III diastolic dysfunction (restrictive).  2. Right ventricular systolic function is normal. The right ventricular size is normal. There is normal pulmonary artery systolic pressure.  3. Left atrial size was mildly dilated.  4. The mitral valve is normal in structure. Trivial mitral valve regurgitation. No evidence of mitral stenosis.  5. The aortic valve is tricuspid. Aortic valve regurgitation is not visualized. No aortic stenosis is present.  6. The inferior vena cava is normal in size with greater than 50% respiratory variability,  suggesting right atrial pressure of 3 mmHg. FINDINGS  Left Ventricle: Left ventricular ejection fraction, by estimation, is 20 to 25%. The left ventricle has severely decreased function. The left ventricle demonstrates global hypokinesis. Definity contrast agent was given IV to delineate the left ventricular endocardial borders. The left ventricular internal cavity size was severely dilated. There is no left ventricular hypertrophy. Left ventricular diastolic parameters are consistent with Grade III diastolic dysfunction (restrictive). Right Ventricle: The right ventricular size is normal. Right ventricular systolic function is normal. There is normal pulmonary artery systolic pressure. The tricuspid regurgitant velocity is 1.69 m/s, and with an assumed right atrial pressure of 3 mmHg,  the estimated right ventricular systolic pressure is 10.6 mmHg. Left Atrium: Left atrial size was mildly dilated. Right Atrium: Right atrial size was normal in size. Pericardium: There is no evidence of pericardial effusion. Mitral Valve: The mitral valve is normal in structure. Trivial mitral valve regurgitation. No evidence of mitral valve stenosis. Tricuspid Valve: The tricuspid valve is normal in structure. Tricuspid valve regurgitation is mild . No evidence of tricuspid stenosis. Aortic Valve: The aortic valve is tricuspid. Aortic valve  regurgitation is not visualized. No aortic stenosis is present. Pulmonic Valve: The pulmonic valve was not well visualized. Pulmonic valve regurgitation is not visualized. No evidence of pulmonic stenosis. Aorta: The aortic root is normal in size and structure. Venous: The inferior vena cava is normal in size with greater than 50% respiratory variability, suggesting right atrial pressure of 3 mmHg.  LEFT VENTRICLE PLAX 2D LVIDd:         6.72 cm  Diastology LVIDs:         5.79 cm  LV e' medial:    3.02 cm/s LV PW:         1.02 cm  LV E/e' medial:  23.5 LV IVS:        0.91 cm  LV e' lateral:   3.64 cm/s LVOT diam:     2.50 cm  LV E/e' lateral: 19.5 LV SV:         47 LV SV Index:   27 LVOT Area:     4.91 cm  RIGHT VENTRICLE RV S prime:     9.62 cm/s TAPSE (M-mode): 2.9 cm LEFT ATRIUM             Index       RIGHT ATRIUM           Index LA diam:        4.70 cm 2.69 cm/m  RA Area:     15.70 cm LA Vol (A2C):   50.0 ml 28.66 ml/m RA Volume:   36.50 ml  20.92 ml/m LA Vol (A4C):   66.4 ml 38.07 ml/m LA Biplane Vol: 61.0 ml 34.97 ml/m  AORTIC VALVE LVOT Vmax:   54.30 cm/s LVOT Vmean:  41.500 cm/s LVOT VTI:    0.096 m  AORTA Ao Root diam: 3.40 cm MITRAL VALVE               TRICUSPID VALVE MV Area (PHT): 5.43 cm    TR Peak grad:   11.4 mmHg MV Decel Time: 140 msec    TR Vmax:        169.00 cm/s MV E velocity: 70.87 cm/s                            SHUNTS  Systemic VTI:  0.10 m                            Systemic Diam: 2.50 cm Kirk Ruths MD Electronically signed by Kirk Ruths MD Signature Date/Time: 10/05/2020/10:38:14 AM    Final      TODAY-DAY OF DISCHARGE:  Subjective:   Tonye Pearson today has no headache,no chest abdominal pain,no new weakness tingling or numbness, feels much better wants to go home today.   Objective:   Blood pressure 124/70, pulse 62, temperature 98 F (36.7 C), temperature source Oral, resp. rate 20, height 5\' 6"  (1.676 m), weight 61.5 kg, SpO2 96  %.  Intake/Output Summary (Last 24 hours) at 10/16/2020 1015 Last data filed at 10/15/2020 1401 Gross per 24 hour  Intake 350 ml  Output 150 ml  Net 200 ml   Filed Weights   10/14/20 0440 10/15/20 0629  Weight: 56.1 kg 61.5 kg    Exam: Awake Alert, Oriented *3, No new F.N deficits, Normal affect [place].AT,PERRAL Supple Neck,No JVD, No cervical lymphadenopathy appriciated.  Symmetrical Chest wall movement, Good air movement bilaterally, CTAB RRR,No Gallops,Rubs or new Murmurs, No Parasternal Heave +ve B.Sounds, Abd Soft, Non tender, No organomegaly appriciated, No rebound -guarding or rigidity. No Cyanosis, Clubbing or edema, No new Rash or bruise   PERTINENT RADIOLOGIC STUDIES: DG Chest Port 1 View  Result Date: 10/14/2020 CLINICAL DATA:  Chest pain and shortness of breath EXAM: PORTABLE CHEST 1 VIEW COMPARISON:  October 13, 2020 and October 04, 2020 FINDINGS: Areas of ill-defined airspace opacity in the lung bases persist without change. No new opacity evident. Heart is upper normal in size with pulmonary vascularity normal, stable. There is aortic atherosclerosis. No bone lesions. IMPRESSION: Ill-defined airspace opacity in the lung bases is stable. No new opacity evident. Stable cardiac silhouette. Electronically Signed   By: Lowella Grip III M.D.   On: 10/14/2020 08:20   DG Chest Portable 1 View  Result Date: 10/04/2020 CLINICAL DATA:  Shortness of breath history of congestive heart failure and COPD. EXAM: PORTABLE CHEST 1 VIEW COMPARISON:  July 17, 2020 FINDINGS: Enlarged cardiac silhouette. Tortuosity of the aorta. Mediastinal contours appear intact. Subtle diffuse peribronchial airspace opacities. No pleural effusion or pneumothorax. Osseous structures are without acute abnormality. Soft tissues are grossly normal. IMPRESSION: 1. Enlarged cardiac silhouette. 2. Subtle diffuse peribronchial airspace opacities may represent bronchitis or atypical pneumonia. 3. Tortuosity of the  aorta. Electronically Signed   By: Fidela Salisbury M.D.   On: 10/04/2020 16:12   ECHOCARDIOGRAM COMPLETE  Result Date: 10/05/2020    ECHOCARDIOGRAM REPORT   Patient Name:   Gregory Mitchell Date of Exam: 10/05/2020 Medical Rec #:  [id number]      Height:       66.0 in Accession #:    [id number]     Weight:       145.0 lb Date of Birth:  [birth year]      BSA:          1.744 m Patient Age:    9 years       BP:           123/75 mmHg Patient Gender: M              HR:           57 bpm. Exam Location:  Inpatient Procedure: 2D Echo, Limited Echo, Cardiac Doppler, Color Doppler and  Intracardiac Opacification Agent Indications:    CHF-Acute Systolic T90.30  History:        Patient has prior history of Echocardiogram examinations, most                 recent 08/31/2019. CHF, COPD, Signs/Symptoms:Shortness of Breath                 and Chest Pain; Risk Factors:Hypertension, Diabetes and Former                 Smoker. COVID 19. Chronic kidney disease.  Sonographer:    Darlina Sicilian RDCS Referring Phys: Ramona  1. Left ventricular ejection fraction, by estimation, is 20 to 25%. The left ventricle has severely decreased function. The left ventricle demonstrates global hypokinesis. The left ventricular internal cavity size was severely dilated. Left ventricular diastolic parameters are consistent with Grade III diastolic dysfunction (restrictive).  2. Right ventricular systolic function is normal. The right ventricular size is normal. There is normal pulmonary artery systolic pressure.  3. Left atrial size was mildly dilated.  4. The mitral valve is normal in structure. Trivial mitral valve regurgitation. No evidence of mitral stenosis.  5. The aortic valve is tricuspid. Aortic valve regurgitation is not visualized. No aortic stenosis is present.  6. The inferior vena cava is normal in size with greater than 50% respiratory variability, suggesting right atrial pressure of 3 mmHg. FINDINGS   Left Ventricle: Left ventricular ejection fraction, by estimation, is 20 to 25%. The left ventricle has severely decreased function. The left ventricle demonstrates global hypokinesis. Definity contrast agent was given IV to delineate the left ventricular endocardial borders. The left ventricular internal cavity size was severely dilated. There is no left ventricular hypertrophy. Left ventricular diastolic parameters are consistent with Grade III diastolic dysfunction (restrictive). Right Ventricle: The right ventricular size is normal. Right ventricular systolic function is normal. There is normal pulmonary artery systolic pressure. The tricuspid regurgitant velocity is 1.69 m/s, and with an assumed right atrial pressure of 3 mmHg,  the estimated right ventricular systolic pressure is 09.2 mmHg. Left Atrium: Left atrial size was mildly dilated. Right Atrium: Right atrial size was normal in size. Pericardium: There is no evidence of pericardial effusion. Mitral Valve: The mitral valve is normal in structure. Trivial mitral valve regurgitation. No evidence of mitral valve stenosis. Tricuspid Valve: The tricuspid valve is normal in structure. Tricuspid valve regurgitation is mild . No evidence of tricuspid stenosis. Aortic Valve: The aortic valve is tricuspid. Aortic valve regurgitation is not visualized. No aortic stenosis is present. Pulmonic Valve: The pulmonic valve was not well visualized. Pulmonic valve regurgitation is not visualized. No evidence of pulmonic stenosis. Aorta: The aortic root is normal in size and structure. Venous: The inferior vena cava is normal in size with greater than 50% respiratory variability, suggesting right atrial pressure of 3 mmHg.  LEFT VENTRICLE PLAX 2D LVIDd:         6.72 cm  Diastology LVIDs:         5.79 cm  LV e' medial:    3.02 cm/s LV PW:         1.02 cm  LV E/e' medial:  23.5 LV IVS:        0.91 cm  LV e' lateral:   3.64 cm/s LVOT diam:     2.50 cm  LV E/e' lateral: 19.5 LV  SV:         47 LV SV Index:  27 LVOT Area:     4.91 cm  RIGHT VENTRICLE RV S prime:     9.62 cm/s TAPSE (M-mode): 2.9 cm LEFT ATRIUM             Index       RIGHT ATRIUM           Index LA diam:        4.70 cm 2.69 cm/m  RA Area:     15.70 cm LA Vol (A2C):   50.0 ml 28.66 ml/m RA Volume:   36.50 ml  20.92 ml/m LA Vol (A4C):   66.4 ml 38.07 ml/m LA Biplane Vol: 61.0 ml 34.97 ml/m  AORTIC VALVE LVOT Vmax:   54.30 cm/s LVOT Vmean:  41.500 cm/s LVOT VTI:    0.096 m  AORTA Ao Root diam: 3.40 cm MITRAL VALVE               TRICUSPID VALVE MV Area (PHT): 5.43 cm    TR Peak grad:   11.4 mmHg MV Decel Time: 140 msec    TR Vmax:        169.00 cm/s MV E velocity: 70.87 cm/s                            SHUNTS                            Systemic VTI:  0.10 m                            Systemic Diam: 2.50 cm Kirk Ruths MD Electronically signed by Kirk Ruths MD Signature Date/Time: 10/05/2020/10:38:14 AM    Final      PERTINENT LAB RESULTS: CBC: Recent Labs    10/15/20 0036 10/16/20 0324  WBC 14.4* 14.2*  HGB 10.2* 10.2*  HCT 33.2* 33.1*  PLT 289 297   CMET CMP     Component Value Date/Time   NA 136 10/16/2020 0324   K 5.1 10/16/2020 0324   CL 98 10/16/2020 0324   CO2 33 (H) 10/16/2020 0324   GLUCOSE 278 (H) 10/16/2020 0324   BUN 48 (H) 10/16/2020 0324   CREATININE 1.41 (H) 10/16/2020 0324   CALCIUM 8.7 (L) 10/16/2020 0324   PROT 5.0 (L) 10/16/2020 0324   ALBUMIN 1.8 (L) 10/16/2020 0324   AST 57 (H) 10/16/2020 0324   ALT 66 (H) 10/16/2020 0324   ALKPHOS 46 10/16/2020 0324   BILITOT 0.4 10/16/2020 0324   GFRNONAA 50 (L) 10/16/2020 0324   GFRAA 55 (L) 02/20/2020 1211    GFR Estimated Creatinine Clearance: 36.3 mL/min (A) (by C-G formula based on SCr of 1.41 mg/dL (H)). No results for input(s): LIPASE, AMYLASE in the last 72 hours. No results for input(s): CKTOTAL, CKMB, CKMBINDEX, TROPONINI in the last 72 hours. Invalid input(s): POCBNP Recent Labs    10/15/20 0036  10/16/20 0324  DDIMER 0.73* 0.41   Recent Labs    10/14/20 0134  HGBA1C 7.2*   No results for input(s): CHOL, HDL, LDLCALC, TRIG, CHOLHDL, LDLDIRECT in the last 72 hours. No results for input(s): TSH, T4TOTAL, T3FREE, THYROIDAB in the last 72 hours.  Invalid input(s): FREET3 No results for input(s): VITAMINB12, FOLATE, FERRITIN, TIBC, IRON, RETICCTPCT in the last 72 hours. Coags: No results for input(s): INR in the last 72 hours.  Invalid input(s): PT Microbiology: Recent Results (  from the past 240 hour(s))  MRSA PCR Screening     Status: None   Collection Time: 10/14/20  2:09 AM   Specimen: Nasal Mucosa; Nasopharyngeal  Result Value Ref Range Status   MRSA by PCR NEGATIVE NEGATIVE Final    Comment:        The GeneXpert MRSA Assay (FDA approved for NASAL specimens only), is one component of a comprehensive MRSA colonization surveillance program. It is not intended to diagnose MRSA infection nor to guide or monitor treatment for MRSA infections. Performed at [facility], [place] [address], [place], [place] [address]     FURTHER DISCHARGE INSTRUCTIONS:  Get Medicines reviewed and adjusted: Please take all your medications with you for your next visit with your Primary MD  Laboratory/radiological data: Please request your Primary MD to go over all hospital tests and procedure/radiological results at the follow up, please ask your Primary MD to get all Hospital records sent to his/her office.  In some cases, they will be blood work, cultures and biopsy results pending at the time of your discharge. Please request that your primary care M.D. goes through all the records of your hospital data and follows up on these results.  Also Note the following: If you experience worsening of your admission symptoms, develop shortness of breath, life threatening emergency, suicidal or homicidal thoughts you must seek medical attention immediately by calling 911 or calling your MD  immediately  if symptoms less severe.  You must read complete instructions/literature along with all the possible adverse reactions/side effects for all the Medicines you take and that have been prescribed to you. Take any new Medicines after you have completely understood and accpet all the possible adverse reactions/side effects.   Do not drive when taking Pain medications or sleeping medications (Benzodaizepines)  Do not take more than prescribed Pain, Sleep and Anxiety Medications. It is not advisable to combine anxiety,sleep and pain medications without talking with your primary care practitioner  Special Instructions: If you have smoked or chewed Tobacco  in the last 2 yrs please stop smoking, stop any regular Alcohol  and or any Recreational drug use.  Wear Seat belts while driving.  Please note: You were cared for by a hospitalist during your hospital stay. Once you are discharged, your primary care physician will handle any further medical issues. Please note that NO REFILLS for any discharge medications will be authorized once you are discharged, as it is imperative that you return to your primary care physician (or establish a relationship with a primary care physician if you do not have one) for your post hospital discharge needs so that they can reassess your need for medications and monitor your lab values.  Total Time spent coordinating discharge including counseling, education and face to face time equals 35 minutes.  SignedOren Binet 10/16/2020 10:15 AM

## 2020-10-16 NOTE — Progress Notes (Signed)
Patient discharged home.  Discharge instructions explained to pt and daughter on the phone, both verbalize understanding.  All belongings returned to patient including cash.  Patient was given walker on discharge.

## 2020-10-16 NOTE — Discharge Instructions (Signed)
Person Under Monitoring Name: Gregory Mitchell  Location: [place] [address]   Infection Prevention Recommendations for Individuals Confirmed to have, or Being Evaluated for, 2019 Novel Coronavirus (COVID-19) Infection Who Receive Care at Home  Individuals who are confirmed to have, or are being evaluated for, COVID-19 should follow the prevention steps below until a healthcare provider or local or state health department says they can return to normal activities.  Stay home except to get medical care You should restrict activities outside your home, except for getting medical care. Do not go to work, school, or public areas, and do not use public transportation or taxis.  Call ahead before visiting your doctor Before your medical appointment, call the healthcare provider and tell them that you have, or are being evaluated for, COVID-19 infection. This will help the healthcare provider's office take steps to keep other people from getting infected. Ask your healthcare provider to call the local or state health department.  Monitor your symptoms Seek prompt medical attention if your illness is worsening (e.g., difficulty breathing). Before going to your medical appointment, call the healthcare provider and tell them that you have, or are being evaluated for, COVID-19 infection. Ask your healthcare provider to call the local or state health department.  Wear a facemask You should wear a facemask that covers your nose and mouth when you are in the same room with other people and when you visit a healthcare provider. People who live with or visit you should also wear a facemask while they are in the same room with you.  Separate yourself from other people in your home As much as possible, you should stay in a different room from other people in your home. Also, you should use a separate bathroom, if available.  Avoid sharing household items You should not share  dishes, drinking glasses, cups, eating utensils, towels, bedding, or other items with other people in your home. After using these items, you should wash them thoroughly with soap and water.  Cover your coughs and sneezes Cover your mouth and nose with a tissue when you cough or sneeze, or you can cough or sneeze into your sleeve. Throw used tissues in a lined trash can, and immediately wash your hands with soap and water for at least 20 seconds or use an alcohol-based hand rub.  Wash your [facility] your hands often and thoroughly with soap and water for at least 20 seconds. You can use an alcohol-based hand sanitizer if soap and water are not available and if your hands are not visibly dirty. Avoid touching your eyes, nose, and mouth with unwashed hands.   Prevention Steps for Caregivers and Household Members of Individuals Confirmed to have, or Being Evaluated for, COVID-19 Infection Being Cared for in the Home  If you live with, or provide care at home for, a person confirmed to have, or being evaluated for, COVID-19 infection please follow these guidelines to prevent infection:  Follow healthcare provider's instructions Make sure that you understand and can help the patient follow any healthcare provider instructions for all care.  Provide for the patient's basic needs You should help the patient with basic needs in the home and provide support for getting groceries, prescriptions, and other personal needs.  Monitor the patient's symptoms If they are getting sicker, call his or her medical provider and tell them that the patient has, or is being evaluated for, COVID-19 infection. This will help the healthcare provider's office  take steps to keep other people from getting infected. Ask the healthcare provider to call the local or state health department.  Limit the number of people who have contact with the patient  If possible, have only one caregiver for the patient.  Other  household members should stay in another home or place of residence. If this is not possible, they should stay  in another room, or be separated from the patient as much as possible. Use a separate bathroom, if available.  Restrict visitors who do not have an essential need to be in the home.  Keep older adults, very young children, and other sick people away from the patient Keep older adults, very young children, and those who have compromised immune systems or chronic health conditions away from the patient. This includes people with chronic heart, lung, or kidney conditions, diabetes, and cancer.  Ensure good ventilation Make sure that shared spaces in the home have good air flow, such as from an air conditioner or an opened window, weather permitting.  Wash your hands often  Wash your hands often and thoroughly with soap and water for at least 20 seconds. You can use an alcohol based hand sanitizer if soap and water are not available and if your hands are not visibly dirty.  Avoid touching your eyes, nose, and mouth with unwashed hands.  Use disposable paper towels to dry your hands. If not available, use dedicated cloth towels and replace them when they become wet.  Wear a facemask and gloves  Wear a disposable facemask at all times in the room and gloves when you touch or have contact with the patient's blood, body fluids, and/or secretions or excretions, such as sweat, saliva, sputum, nasal mucus, vomit, urine, or feces.  Ensure the mask fits over your nose and mouth tightly, and do not touch it during use.  Throw out disposable facemasks and gloves after using them. Do not reuse.  Wash your hands immediately after removing your facemask and gloves.  If your personal clothing becomes contaminated, carefully remove clothing and launder. Wash your hands after handling contaminated clothing.  Place all used disposable facemasks, gloves, and other waste in a lined container before  disposing them with other household waste.  Remove gloves and wash your hands immediately after handling these items.  Do not share dishes, glasses, or other household items with the patient  Avoid sharing household items. You should not share dishes, drinking glasses, cups, eating utensils, towels, bedding, or other items with a patient who is confirmed to have, or being evaluated for, COVID-19 infection.  After the person uses these items, you should wash them thoroughly with soap and water.  Wash laundry thoroughly  Immediately remove and wash clothes or bedding that have blood, body fluids, and/or secretions or excretions, such as sweat, saliva, sputum, nasal mucus, vomit, urine, or feces, on them.  Wear gloves when handling laundry from the patient.  Read and follow directions on labels of laundry or clothing items and detergent. In general, wash and dry with the warmest temperatures recommended on the label.  Clean all areas the individual has used often  Clean all touchable surfaces, such as counters, tabletops, doorknobs, bathroom fixtures, toilets, phones, keyboards, tablets, and bedside tables, every day. Also, clean any surfaces that may have blood, body fluids, and/or secretions or excretions on them.  Wear gloves when cleaning surfaces the patient has come in contact with.  Use a diluted bleach solution (e.g., dilute bleach with 1 part  bleach and 10 parts water) or a household disinfectant with a label that says EPA-registered for coronaviruses. To make a bleach solution at home, add 1 tablespoon of bleach to 1 quart (4 cups) of water. For a larger supply, add  cup of bleach to 1 gallon (16 cups) of water.  Read labels of cleaning products and follow recommendations provided on product labels. Labels contain instructions for safe and effective use of the cleaning product including precautions you should take when applying the product, such as wearing gloves or eye protection  and making sure you have good ventilation during use of the product.  Remove gloves and wash hands immediately after cleaning.  Monitor yourself for signs and symptoms of illness Caregivers and household members are considered close contacts, should monitor their health, and will be asked to limit movement outside of the home to the extent possible. Follow the monitoring steps for close contacts listed on the symptom monitoring form.   ? If you have additional questions, contact your local health department or call the epidemiologist on call at [phone] (available 24/7). ? This guidance is subject to change. For the most up-to-date guidance from [facility], please refer to their website: [email or web address]

## 2020-10-16 NOTE — Progress Notes (Signed)
All belongings returned to pt from security, 2 wallets, an inhaler, chewing tobacco, 2 checks, and  $1,228 was counted with pt.

## 2020-10-28 ENCOUNTER — Encounter: Payer: Self-pay | Admitting: Gastroenterology

## 2020-10-29 NOTE — Progress Notes (Signed)
PCP: Neale Burly, MD Cardiology: Dr Harl Bowie HF Cardiology: Dr. Aundra Dubin  80 y.o. with history of COPD, CKD stage 3, HTN, COVID 09/2020, and chronic systolic CHF   Patient was first found to have CHF in 1/21.  He was admitted at that time with volume overload and dyspnea.  Cath showed no significant coronary disease.  Echo showed EF 25-30%.  Most recent prior to 2017 showed normal EF.  He was admitted in [place] in 8/21 with CHF and PNA.  He was admitted at [facility] in 12/21 with COVID-19 and CHF. He also has emphysema and uses home oxygen at night. He no longer smokes.  He had a PE earlier this year and is on apixaban.   Admitted the end of February and again in March due to COVID pneumonia. Last week he was seen in the ED 10/21/20 with shortness of breath. Treated for AECOPD with doxycycline + prednisone.   Today he returns for HF follow up. Says he wants to be seen by [facility] in [place] because its closer to his house. Has had some chest tightness but feels better after taking lasix. Overall feeling fine. Remains SOB with exertion. Denies PND/Orthopnea. Appetite ok. No fever or chills. Weight at home has been stable. Taking all medications.   Labs (12/21): K 4.7, creatinine 1.4 Labs (08/13/2020): K 4.2 Creatinine 1.2 Labs (09/01/20): K 4.6 Creatinine 1.4  Labs ( 10/16/20): K 5.1 Creatinine 1.4   PMH: 1. HTN 2. Hyperlipidemia 3. PE: 2021 4. COPD: Emphysema.  He is on oxygen at night.  Quit tobacco 40+ years ago.  5. Type 2 diabetes 6. CKD stage 3 7. GERD with esophagitis 8. COVID-19 infection 9. Chronic systolic CHF: Nonischemic cardiomyopathy.  - Echo (2017): EF 55-60% - LHC (1/21): Normal coronaries.  - Echo (1/21): EF 25-30%, mild MR, normal RV  SH: Lives in [place].  Retired.  Former smoker.  Married.    Family History  Problem Relation Age of Onset  . Lung cancer Brother   . Stroke Mother   . Breast cancer Mother   . Colon cancer Neg Hx    ROS: All systems  reviewed and negative except as per HPI.   Current Outpatient Medications  Medication Sig Dispense Refill  . albuterol (VENTOLIN HFA) 108 (90 Base) MCG/ACT inhaler Inhale 2 puffs into the lungs every 6 (six) hours as needed for wheezing or shortness of breath.     [name] apixaban (ELIQUIS) 5 MG TABS tablet Take 5 mg by mouth 2 (two) times daily.    . benzonatate (TESSALON PERLES) 100 MG capsule Take 1 capsule (100 mg total) by mouth 3 (three) times daily as needed for cough. 30 capsule 0  . carvedilol (COREG) 12.5 MG tablet Take 1 tablet (12.5 mg total) by mouth 2 (two) times daily. 60 tablet 11  . dapagliflozin propanediol (FARXIGA) 10 MG TABS tablet Take 1 tablet (10 mg total) by mouth daily before breakfast. 30 tablet 11  . diclofenac Sodium (VOLTAREN) 1 % GEL Apply 1 application topically 4 (four) times daily as needed (pain).    [name] doxycycline (VIBRAMYCIN) 100 MG capsule Take by mouth.    . furosemide (LASIX) 20 MG tablet Take 20 mg by mouth 2 (two) times daily.    . hydrALAZINE (APRESOLINE) 10 MG tablet Take 10 mg by mouth 3 (three) times daily.    [name] ipratropium-albuterol (DUONEB) 0.5-2.5 (3) MG/3ML SOLN Take 3 mLs by nebulization every 6 (six) hours as needed (for shortness of  breath/wheezing).     . metFORMIN (GLUCOPHAGE-XR) 500 MG 24 hr tablet Take 1,000 mg by mouth 2 (two) times daily.    [name] omeprazole (PRILOSEC) 40 MG capsule Take 1 capsule (40 mg total) by mouth 2 (two) times daily before a meal. 30 minutes before breakfast and 30 minutes before dinner 60 capsule 5  . OXYGEN Inhale 2 L into the lungs at bedtime as needed (for shortness of breath).    . prednisoLONE sodium phosphate (PEDIAPRED) 6.7 (5 Base) MG/5ML SOLN TAKE TWO TEASPOONFULS (10 ML) BY MOUTH EVERY DAY    . predniSONE (DELTASONE) 10 MG tablet Take 40 mg daily for 1 day, 30 mg daily for 1 day, 20 mg daily for 1 days,10 mg daily for 1 day, then stop 10 tablet 0  . sacubitril-valsartan (ENTRESTO) 24-26 MG Take 1 tablet by mouth 2  (two) times daily. 60 tablet 11  . SYMBICORT 160-4.5 MCG/ACT inhaler Inhale 2 puffs into the lungs in the morning and at bedtime.    . tamsulosin (FLOMAX) 0.4 MG CAPS capsule Take 1 capsule (0.4 mg total) by mouth daily. (Patient taking differently: Take 0.4 mg by mouth 2 (two) times daily.) 30 capsule 0  . vitamin C (ASCORBIC ACID) 500 MG tablet Take 500 mg by mouth daily.     No current facility-administered medications for this encounter.   BP 124/80   Pulse 87   Wt 63.4 kg (139 lb 12.8 oz)   SpO2 95%   BMI 22.56 kg/m   Vitals:   10/30/20 1336  BP: 124/80  Pulse: 87  SpO2: 95%   Wt Readings from Last 3 Encounters:  10/30/20 63.4 kg (139 lb 12.8 oz)  10/15/20 61.5 kg (135 lb 9.3 oz)  10/06/20 60.3 kg (132 lb 15 oz)    Reds Clip 26% General:  Elderly arrived in a wheel chair. No resp difficulty HEENT: normal Neck: supple. no JVD. Carotids 2+ bilat; no bruits. No lymphadenopathy or thryomegaly appreciated. Cor: PMI nondisplaced. Regular rate & rhythm. No rubs, gallops or murmurs. Lungs: clear Abdomen: soft, nontender, nondistended. No hepatosplenomegaly. No bruits or masses. Good bowel sounds. Extremities: no cyanosis, clubbing, rash, edema Neuro: alert & orientedx3, cranial nerves grossly intact. moves all 4 extremities w/o difficulty. Affect pleasant  EKG: SR 84 bpm QRS 142 ms occasional PVC   Assessment/Plan: 1. Chronic systolic CHF: Nonischemic cardiomyopathy.  LHC in 1/21 with no significant coronary disease.  Echo (1/21) with EF 25-30%, mild MR, normal RV. He has had several admission with CHF and COPD.  Cause of cardiomyopathy is uncertain.  NYHA III. Reds Clip 26%. Does not need lasix.  - Continue  coreg 12.5 mg bid. - Continue Entresto 24/26 bid.  - Continue Farxiga 10 mg daily with CHF and CKD.  - Continue hydralazine 10 mg tid  - Hold off on spiro. He does not want to add another medication.  - Will titrate meds as creatinine and BP tolerate, will need repeat  assessment of LV EF eventually => would favor cardiac MRI to look for infiltrative disease.  2. History of PE: He remains on Eliquis. No bleeding issues.  3. CKD: Stage 3a 10/16/20 Creatinine 1.4 recent check.  4. COPD: On oxygen at night.  Recent AECOPD 5. HTN: Controlled.  6. COVID Pneumonia 09/2020 - Continues to recover   He is reluctant add another medication.  Followed at the HF clinic as needed. He plans to establish with Dr Harl Bowie because its closer to his house. He already  has follow up next month.     Bettylee Feig NP-C  10/30/2020

## 2020-10-30 ENCOUNTER — Encounter (HOSPITAL_COMMUNITY): Payer: Self-pay

## 2020-10-30 ENCOUNTER — Ambulatory Visit (HOSPITAL_COMMUNITY)
Admission: RE | Admit: 2020-10-30 | Discharge: 2020-10-30 | Disposition: A | Discharge status: Home / Self Care | Payer: Medicare HMO | Source: Ambulatory Visit | Attending: Adult Health | Admitting: Adult Health

## 2020-10-30 ENCOUNTER — Other Ambulatory Visit: Payer: Self-pay

## 2020-10-30 VITALS — BP 124/80 | HR 87 | Wt 139.8 lb

## 2020-10-30 DIAGNOSIS — Z9981 Dependence on supplemental oxygen: Secondary | ICD-10-CM | POA: Insufficient documentation

## 2020-10-30 DIAGNOSIS — Z79899 Other long term (current) drug therapy: Secondary | ICD-10-CM | POA: Diagnosis not present

## 2020-10-30 DIAGNOSIS — I5022 Chronic systolic (congestive) heart failure: Secondary | ICD-10-CM | POA: Diagnosis not present

## 2020-10-30 DIAGNOSIS — R0602 Shortness of breath: Secondary | ICD-10-CM | POA: Insufficient documentation

## 2020-10-30 DIAGNOSIS — J1282 Pneumonia due to coronavirus disease 2019: Secondary | ICD-10-CM | POA: Diagnosis not present

## 2020-10-30 DIAGNOSIS — R0600 Dyspnea, unspecified: Secondary | ICD-10-CM

## 2020-10-30 DIAGNOSIS — Z86711 Personal history of pulmonary embolism: Secondary | ICD-10-CM | POA: Diagnosis not present

## 2020-10-30 DIAGNOSIS — Z7901 Long term (current) use of anticoagulants: Secondary | ICD-10-CM | POA: Insufficient documentation

## 2020-10-30 DIAGNOSIS — Z7984 Long term (current) use of oral hypoglycemic drugs: Secondary | ICD-10-CM | POA: Diagnosis not present

## 2020-10-30 DIAGNOSIS — U071 COVID-19: Secondary | ICD-10-CM | POA: Insufficient documentation

## 2020-10-30 DIAGNOSIS — I428 Other cardiomyopathies: Secondary | ICD-10-CM | POA: Diagnosis not present

## 2020-10-30 DIAGNOSIS — I130 Hypertensive heart and chronic kidney disease with heart failure and stage 1 through stage 4 chronic kidney disease, or unspecified chronic kidney disease: Secondary | ICD-10-CM | POA: Diagnosis not present

## 2020-10-30 DIAGNOSIS — J440 Chronic obstructive pulmonary disease with acute lower respiratory infection: Secondary | ICD-10-CM | POA: Diagnosis not present

## 2020-10-30 DIAGNOSIS — N1831 Chronic kidney disease, stage 3a: Secondary | ICD-10-CM | POA: Diagnosis not present

## 2020-10-30 DIAGNOSIS — R0789 Other chest pain: Secondary | ICD-10-CM | POA: Diagnosis not present

## 2020-10-30 DIAGNOSIS — Z87891 Personal history of nicotine dependence: Secondary | ICD-10-CM | POA: Diagnosis not present

## 2020-10-30 DIAGNOSIS — R0609 Other forms of dyspnea: Secondary | ICD-10-CM

## 2020-10-30 MED ORDER — DAPAGLIFLOZIN PROPANEDIOL 10 MG PO TABS: 10.0000 mg | ORAL_TABLET | Freq: Every day | ORAL | 11 refills | Status: DC

## 2020-10-30 NOTE — Patient Instructions (Signed)
EKG done today.  No Labs done today.   No medication changes were made. Please continue all current medications as prescribed.  Your physician recommends that you schedule a follow-up appointment as needed.   If you have any questions or concerns before your next appointment please send [place] a message through [place] or call our office at [phone].    TO LEAVE A MESSAGE FOR THE NURSE SELECT OPTION 2, PLEASE LEAVE A MESSAGE INCLUDING: . YOUR NAME . DATE OF BIRTH . CALL BACK NUMBER . REASON FOR CALL**this is important as we prioritize the call backs  YOU WILL RECEIVE A CALL BACK THE SAME DAY AS LONG AS YOU CALL BEFORE 4:00 PM   Do the following things EVERYDAY: 1) Weigh yourself in the morning before breakfast. Write it down and keep it in a log. 2) Take your medicines as prescribed 3) Eat low salt foods--Limit salt (sodium) to 2000 mg per day.  4) Stay as active as you can everyday 5) Limit all fluids for the day to less than 2 liters   At the [facility], you and your health needs are our priority. As part of our continuing mission to provide you with exceptional heart care, we have created designated Provider Care Teams. These Care Teams include your primary Cardiologist (physician) and Advanced Practice Providers (APPs- Physician Assistants and Nurse Practitioners) who all work together to provide you with the care you need, when you need it.   You may see any of the following providers on your designated Care Team at your next follow up: [name] Dr Glori Bickers . Dr Loralie Champagne . Darrick Grinder, NP . Lyda Jester, PA . Audry Riles, PharmD   Please be sure to bring in all your medications bottles to every appointment.

## 2020-11-12 ENCOUNTER — Telehealth: Payer: Self-pay | Admitting: Internal Medicine

## 2020-11-12 NOTE — Telephone Encounter (Signed)
Patient needs his prescription Dr. Abbey Chatters wrote him for yeast in his esophagus sent to [facility]

## 2020-11-13 NOTE — Telephone Encounter (Signed)
Good Morning!!!!  I see this pt has been treated several times for yeast in his esophagus. The pt is requesting another Rx be sent to [facility].

## 2020-11-15 ENCOUNTER — Emergency Department (HOSPITAL_COMMUNITY): Payer: Medicare HMO

## 2020-11-15 ENCOUNTER — Other Ambulatory Visit: Payer: Self-pay

## 2020-11-15 ENCOUNTER — Encounter (HOSPITAL_COMMUNITY): Payer: Self-pay | Admitting: Emergency Medicine

## 2020-11-15 ENCOUNTER — Emergency Department (HOSPITAL_COMMUNITY)
Admission: EM | Admit: 2020-11-15 | Discharge: 2020-11-15 | Disposition: A | Discharge status: Home / Self Care | Payer: Medicare HMO | Attending: Emergency Medicine | Admitting: Emergency Medicine

## 2020-11-15 DIAGNOSIS — I130 Hypertensive heart and chronic kidney disease with heart failure and stage 1 through stage 4 chronic kidney disease, or unspecified chronic kidney disease: Secondary | ICD-10-CM | POA: Insufficient documentation

## 2020-11-15 DIAGNOSIS — E119 Type 2 diabetes mellitus without complications: Secondary | ICD-10-CM | POA: Insufficient documentation

## 2020-11-15 DIAGNOSIS — Z7951 Long term (current) use of inhaled steroids: Secondary | ICD-10-CM | POA: Insufficient documentation

## 2020-11-15 DIAGNOSIS — J45909 Unspecified asthma, uncomplicated: Secondary | ICD-10-CM | POA: Diagnosis not present

## 2020-11-15 DIAGNOSIS — Z87891 Personal history of nicotine dependence: Secondary | ICD-10-CM | POA: Diagnosis not present

## 2020-11-15 DIAGNOSIS — N1831 Chronic kidney disease, stage 3a: Secondary | ICD-10-CM | POA: Insufficient documentation

## 2020-11-15 DIAGNOSIS — R5381 Other malaise: Secondary | ICD-10-CM | POA: Diagnosis not present

## 2020-11-15 DIAGNOSIS — Z7984 Long term (current) use of oral hypoglycemic drugs: Secondary | ICD-10-CM | POA: Insufficient documentation

## 2020-11-15 DIAGNOSIS — Z7901 Long term (current) use of anticoagulants: Secondary | ICD-10-CM | POA: Diagnosis not present

## 2020-11-15 DIAGNOSIS — J449 Chronic obstructive pulmonary disease, unspecified: Secondary | ICD-10-CM | POA: Diagnosis not present

## 2020-11-15 DIAGNOSIS — R1013 Epigastric pain: Secondary | ICD-10-CM | POA: Diagnosis not present

## 2020-11-15 DIAGNOSIS — R079 Chest pain, unspecified: Secondary | ICD-10-CM | POA: Diagnosis present

## 2020-11-15 DIAGNOSIS — Z955 Presence of coronary angioplasty implant and graft: Secondary | ICD-10-CM | POA: Insufficient documentation

## 2020-11-15 DIAGNOSIS — Z8616 Personal history of COVID-19: Secondary | ICD-10-CM | POA: Diagnosis not present

## 2020-11-15 DIAGNOSIS — I5042 Chronic combined systolic (congestive) and diastolic (congestive) heart failure: Secondary | ICD-10-CM | POA: Insufficient documentation

## 2020-11-15 DIAGNOSIS — R52 Pain, unspecified: Secondary | ICD-10-CM

## 2020-11-15 DIAGNOSIS — M791 Myalgia, unspecified site: Secondary | ICD-10-CM | POA: Insufficient documentation

## 2020-11-15 DIAGNOSIS — Z79899 Other long term (current) drug therapy: Secondary | ICD-10-CM | POA: Diagnosis not present

## 2020-11-15 DIAGNOSIS — R5383 Other fatigue: Secondary | ICD-10-CM | POA: Diagnosis not present

## 2020-11-15 LAB — URINALYSIS, ROUTINE W REFLEX MICROSCOPIC
Bilirubin Urine: NEGATIVE
Glucose, UA: 50 mg/dL — AB
Hgb urine dipstick: NEGATIVE
Ketones, ur: NEGATIVE mg/dL
Leukocytes,Ua: NEGATIVE
Nitrite: NEGATIVE
Protein, ur: NEGATIVE mg/dL
Specific Gravity, Urine: 1.008 (ref 1.005–1.030)
pH: 6 (ref 5.0–8.0)

## 2020-11-15 LAB — CBC
HCT: 36.4 % — ABNORMAL LOW (ref 39.0–52.0)
Hemoglobin: 10.3 g/dL — ABNORMAL LOW (ref 13.0–17.0)
MCH: 27.7 pg (ref 26.0–34.0)
MCHC: 28.3 g/dL — ABNORMAL LOW (ref 30.0–36.0)
MCV: 97.8 fL (ref 80.0–100.0)
Platelets: 348 10*3/uL (ref 150–400)
RBC: 3.72 MIL/uL — ABNORMAL LOW (ref 4.22–5.81)
RDW: 16.2 % — ABNORMAL HIGH (ref 11.5–15.5)
WBC: 6.2 10*3/uL (ref 4.0–10.5)
nRBC: 0 % (ref 0.0–0.2)

## 2020-11-15 LAB — BASIC METABOLIC PANEL
Anion gap: 9 (ref 5–15)
BUN: 16 mg/dL (ref 8–23)
CO2: 29 mmol/L (ref 22–32)
Calcium: 8.4 mg/dL — ABNORMAL LOW (ref 8.9–10.3)
Chloride: 105 mmol/L (ref 98–111)
Creatinine, Ser: 1.29 mg/dL — ABNORMAL HIGH (ref 0.61–1.24)
GFR, Estimated: 56 mL/min — ABNORMAL LOW (ref >60)
Glucose, Bld: 85 mg/dL (ref 70–99)
Potassium: 3.9 mmol/L (ref 3.5–5.1)
Sodium: 143 mmol/L (ref 135–145)

## 2020-11-15 LAB — TROPONIN I (HIGH SENSITIVITY): Troponin I (High Sensitivity): 22 ng/L — ABNORMAL HIGH (ref <18)

## 2020-11-15 MED ORDER — ESOMEPRAZOLE MAGNESIUM 40 MG PO CPDR: 40.0000 mg | DELAYED_RELEASE_CAPSULE | Freq: Every day | ORAL | 0 refills | Status: DC

## 2020-11-15 MED ORDER — LIDOCAINE VISCOUS HCL 2 % MT SOLN
15.0000 mL | Freq: Once | OROMUCOSAL | Status: AC
  Administered 2020-11-15: 15 mL via ORAL
  Filled 2020-11-15: qty 15

## 2020-11-15 MED ORDER — ALUM & MAG HYDROXIDE-SIMETH 200-200-20 MG/5ML PO SUSP
30.0000 mL | Freq: Once | ORAL | Status: AC
  Administered 2020-11-15: 30 mL via ORAL
  Filled 2020-11-15: qty 30

## 2020-11-15 NOTE — ED Provider Notes (Signed)
[facility] EMERGENCY DEPARTMENT Provider Note   CSN: [id number] Arrival date & time: 11/15/20  1625     History Chief Complaint  Patient presents with  . Chest Pain    Gregory Mitchell is a 80 y.o. male.  who presents with chief complaint of chest pain.  He complains of chest pain "every time I try to eat or drink."  He states that this has been going on ever since he got Covid about 3 weeks ago.  He also complains of hurting all over and feeling very weak and tired which is what brought him in.  Patient is very difficult to understand which may be secondary to his vernacular and edentulousness however have a difficult time collecting history from the patient.  He denies any exertional chest pain, nausea, vomiting, hematemesis, melena or hematochezia.  HPI     Past Medical History:  Diagnosis Date  . Asthma   . Chronic renal insufficiency   . COPD (chronic obstructive pulmonary disease) ([place])   . Heart failure (HCC)    Acute on chronic combined systolic and diastolic heart failure  . Hypertension   . Pulmonary emboli ([place]) 03/2019  . Renal insufficiency   . Type 2 diabetes mellitus [facility])     Patient Active Problem List   Diagnosis Date Noted  . Bacterial pneumonia 10/14/2020  . History of pulmonary embolism 10/14/2020  . Elevated troponin 10/14/2020  . Left lower lobe pneumonia 10/14/2020  . Pressure injury of skin 10/14/2020  . Chronic kidney disease, stage 3a ([place]) 10/05/2020  . Chronic combined systolic and diastolic CHF (congestive heart failure) ([place]) 10/05/2020  . COPD with acute exacerbation ([place]) 10/05/2020  . COVID-19 virus infection 10/04/2020  . Acid reflux 06/12/2020  . Candidal esophagitis ([place]) 05/21/2020  . Esophageal dysphagia 02/20/2020  . Abdominal pain, epigastric 02/20/2020  . Abnormal CT of liver 02/20/2020  . AKI (acute kidney injury) ([place]) 09/04/2019  . Acute on chronic systolic CHF (congestive heart failure) ([place]) 09/04/2019  . Trifascicular  block 09/04/2019  . Acute on chronic respiratory failure with hypoxia ([place]) 08/31/2019  . Acute on chronic diastolic CHF (congestive heart failure) ([place]) 08/31/2019  . COPD (chronic obstructive pulmonary disease) ([place]) 12/21/2013  . Bronchiectasis ([place]) 12/21/2013  . Dyspnea on exertion 08/23/2012  . HTN (hypertension) 08/23/2012  . Fever 02/11/2012  . Obesity 01/11/2012  . Chest pain 11/29/2011  . Cardiomyopathy, secondary ([place]) 11/29/2011  . Chronic renal insufficiency   . Type 2 diabetes mellitus ([place])     Past Surgical History:  Procedure Laterality Date  . APPENDECTOMY    . BALLOON DILATION N/A 03/18/2020   Procedure: BALLOON DILATION;  Surgeon: Eloise Harman, DO;  Location: [facility] ENDO SUITE;  Service: Endoscopy;  Laterality: N/A;  . CHOLECYSTECTOMY    . COLONOSCOPY  10/2006   Dr.Anwar:normal  . ESOPHAGOGASTRODUODENOSCOPY (EGD) WITH PROPOFOL N/A 03/18/2020   Procedure: ESOPHAGOGASTRODUODENOSCOPY (EGD) WITH PROPOFOL;  Surgeon: Eloise Harman, DO;  Location: [facility] ENDO SUITE;  Service: Endoscopy;  Laterality: N/A;  2:30pm  . NASAL SINUS SURGERY    . NEPHRECTOMY     RIGHT (Question CA.  No further therapy needed.)  . RIGHT/LEFT HEART CATH AND CORONARY ANGIOGRAPHY N/A 09/03/2019   Procedure: RIGHT/LEFT HEART CATH AND CORONARY ANGIOGRAPHY;  Surgeon: Lorretta Harp, MD;  Location: [facility] LAB;  Service: Cardiovascular;  Laterality: N/A;       Family History  Problem Relation Age of Onset  . Lung cancer Brother   .  Stroke Mother   . Breast cancer Mother   . Colon cancer Neg Hx     Social History   Tobacco Use  . Smoking status: Former Smoker    Packs/day: 1.00    Years: 15.00    Pack years: 15.00    Types: Cigarettes    Start date: 07/14/1957    Quit date: 08/09/1976    Years since quitting: 44.2  . Smokeless tobacco: Former Systems developer    Types: Chew    Quit date: 02/10/2006  . Tobacco comment: chewed tobacco for 10 years  Vaping Use  . Vaping Use: Never used   Substance Use Topics  . Alcohol use: Not Currently    Alcohol/week: 0.0 standard drinks    Comment: no etoh since he was in his 89s  . Drug use: Never    Home Medications Prior to Admission medications   Medication Sig Start Date End Date Taking? Authorizing Provider  esomeprazole (NEXIUM) 40 MG capsule Take 1 capsule (40 mg total) by mouth daily. 11/15/20  Yes Wessley Emert, PA-C  albuterol (VENTOLIN HFA) 108 (90 Base) MCG/ACT inhaler Inhale 2 puffs into the lungs every 6 (six) hours as needed for wheezing or shortness of breath.     [provider]  apixaban (ELIQUIS) 5 MG TABS tablet Take 5 mg by mouth 2 (two) times daily.    [provider]  benzonatate (TESSALON PERLES) 100 MG capsule Take 1 capsule (100 mg total) by mouth 3 (three) times daily as needed for cough. 10/16/20 10/16/21  Ghimire, Henreitta Leber, MD  carvedilol (COREG) 12.5 MG tablet Take 1 tablet (12.5 mg total) by mouth 2 (two) times daily. 09/04/20 09/04/21  Darrick Grinder D, NP  dapagliflozin propanediol (FARXIGA) 10 MG TABS tablet Take 1 tablet (10 mg total) by mouth daily before breakfast. 10/30/20   Clegg, Amy D, NP  diclofenac Sodium (VOLTAREN) 1 % GEL Apply 1 application topically 4 (four) times daily as needed (pain). 08/29/20   [provider]  furosemide (LASIX) 20 MG tablet Take 20 mg by mouth 2 (two) times daily.    [provider]  hydrALAZINE (APRESOLINE) 10 MG tablet Take 10 mg by mouth 3 (three) times daily. 08/27/20   [provider]  ipratropium-albuterol (DUONEB) 0.5-2.5 (3) MG/3ML SOLN Take 3 mLs by nebulization every 6 (six) hours as needed (for shortness of breath/wheezing).     [provider]  metFORMIN (GLUCOPHAGE-XR) 500 MG 24 hr tablet Take 1,000 mg by mouth 2 (two) times daily.    [provider]  omeprazole (PRILOSEC) 40 MG capsule Take 1 capsule (40 mg total) by mouth 2 (two) times daily before a meal. 30 minutes before breakfast and 30 minutes  before dinner 07/15/20 01/11/21  Eloise Harman, DO  OXYGEN Inhale 2 L into the lungs at bedtime as needed (for shortness of breath).    [provider]  prednisoLONE sodium phosphate (PEDIAPRED) 6.7 (5 Base) MG/5ML SOLN TAKE TWO TEASPOONFULS (10 ML) BY MOUTH EVERY DAY 10/28/20   [provider]  predniSONE (DELTASONE) 10 MG tablet Take 40 mg daily for 1 day, 30 mg daily for 1 day, 20 mg daily for 1 days,10 mg daily for 1 day, then stop 10/16/20   Ghimire, Henreitta Leber, MD  sacubitril-valsartan (ENTRESTO) 24-26 MG Take 1 tablet by mouth 2 (two) times daily. 08/13/20   Larey Dresser, MD  SYMBICORT 160-4.5 MCG/ACT inhaler Inhale 2 puffs into the lungs in the morning and at bedtime. 02/03/20  [provider]  tamsulosin (FLOMAX) 0.4 MG CAPS capsule Take 1 capsule (0.4 mg total) by mouth daily. Patient taking differently: Take 0.4 mg by mouth 2 (two) times daily. 10/16/20   Ghimire, Henreitta Leber, MD  vitamin C (ASCORBIC ACID) 500 MG tablet Take 500 mg by mouth daily.    [provider]    Allergies    Amoxicillin, Ciprofloxacin, and Sulfa antibiotics  Review of Systems   Review of Systems Ten systems reviewed and are negative for acute change, except as noted in the HPI.   Physical Exam Updated Vital Signs BP 107/61   Pulse 75   Temp 98.9 F (37.2 C) (Rectal)   Resp (!) 25   Ht 5\' 6"  (1.676 m)   Wt 65.8 kg   SpO2 100%   BMI 23.40 kg/m   Physical Exam Vitals and nursing note reviewed.  Constitutional:      General: He is not in acute distress.    Appearance: He is well-developed. He is not diaphoretic.  HENT:     Head: Normocephalic and atraumatic.  Eyes:     General: No scleral icterus.    Conjunctiva/sclera: Conjunctivae normal.  Cardiovascular:     Rate and Rhythm: Normal rate and regular rhythm.     Heart sounds: Normal heart sounds.  Pulmonary:     Effort: Pulmonary effort is normal. No respiratory distress.     Breath sounds: Normal breath  sounds.  Abdominal:     Palpations: Abdomen is soft.     Tenderness: There is abdominal tenderness (epigastrium).  Musculoskeletal:     Cervical back: Normal range of motion and neck supple.  Skin:    General: Skin is warm and dry.  Neurological:     Mental Status: He is alert.  Psychiatric:        Behavior: Behavior normal.     ED Results / Procedures / Treatments   Labs (all labs ordered are listed, but only abnormal results are displayed) Labs Reviewed  BASIC METABOLIC PANEL - Abnormal; Notable for the following components:      Result Value   Creatinine, Ser 1.29 (*)    Calcium 8.4 (*)    GFR, Estimated 56 (*)    All other components within normal limits  CBC - Abnormal; Notable for the following components:   RBC 3.72 (*)    Hemoglobin 10.3 (*)    HCT 36.4 (*)    MCHC 28.3 (*)    RDW 16.2 (*)    All other components within normal limits  URINALYSIS, ROUTINE W REFLEX MICROSCOPIC - Abnormal; Notable for the following components:   Color, Urine STRAW (*)    Glucose, UA 50 (*)    All other components within normal limits  TROPONIN I (HIGH SENSITIVITY) - Abnormal; Notable for the following components:   Troponin I (High Sensitivity) 22 (*)    All other components within normal limits  TROPONIN I (HIGH SENSITIVITY)    EKG EKG Interpretation  Date/Time:  Saturday November 15 2020 17:19:47 EDT Ventricular Rate:  82 PR Interval:  206 QRS Duration: 144 QT Interval:  431 QTC Calculation: 504 R Axis:   -72 Text Interpretation: Sinus rhythm Multiform ventricular premature complexes No significantchance from prior Oct 30 2020 ecg Confirmed by Octaviano Glow [phone]) on 11/15/2020 5:47:50 PM   Radiology DG Chest Port 1 View  Result Date: 11/15/2020 CLINICAL DATA:  Chest pain and shortness of breath. EXAM: PORTABLE CHEST 1 VIEW COMPARISON:  11/10/2020 and earlier, including  CT chest 07/17/2020. FINDINGS: Cardiac silhouette mildly enlarged for AP portable technique. Chronic  scar/atelectasis and bronchiectasis involving the medial LEFT LOWER LOBE, unchanged. Lungs otherwise clear. Emphysematous changes throughout both lungs. IMPRESSION: 1. Stable mild cardiomegaly.  No acute cardiopulmonary disease. 2.  Emphysema. ([id number]) Electronically Signed   By: Evangeline Dakin M.D.   On: 11/15/2020 18:01    Procedures Procedures   Medications Ordered in ED Medications  alum & mag hydroxide-simeth (MAALOX/MYLANTA) 200-200-20 MG/5ML suspension 30 mL (30 mLs Oral Given 11/15/20 1932)    And  lidocaine (XYLOCAINE) 2 % viscous mouth solution 15 mL (15 mLs Oral Given 11/15/20 1932)    ED Course  I have reviewed the triage vital signs and the nursing notes.  Pertinent labs & imaging results that were available during my care of the patient were reviewed by me and considered in my medical decision making (see chart for details).    MDM Rules/Calculators/A&P                          CC: Chest pain after eating or drinking VS:  Vitals:   11/15/20 1652 11/15/20 1700 11/15/20 1740 11/15/20 1830  BP:  (!) 98/59  107/61  Pulse:  77  75  Resp:  (!) 27  (!) 25  Temp:   98.9 F (37.2 C)   TempSrc:   Rectal   SpO2:  97%  100%  Weight: 65.8 kg     Height: 5\' 6"  (1.676 m)       JS:EGBTDVV is gathered by patient and EMR. Previous records obtained and reviewed. DDX:The patient's complaint of epigastric abdominal pain involves an extensive number of diagnostic and treatment options, and is a complaint that carries with it a high risk of complications, morbidity, and potential mortality. Given the large differential diagnosis, medical decision making is of high complexity. Differential diagnosis of epigastric pain includes: Functional or nonulcer dyspepsia PUD, GERD, Gastritis, (NSAIDs, alcohol, stress, H. pylori, pernicious anemia), pancreatitis or pancreatic cancer, overeating indigestion (high-fat foods, coffee), drugs (aspirin, antibiotics (eg, macrolides, metronidazole),  corticosteroids, digoxin, narcotics, theophylline), gastroparesis, lactose intolerance, malabsorption gastric cancer, parasitic infection, (Giardia, Strongyloides, Ascaris) cholelithiasis, choledocholithiasis, or cholangitis, ACS, pericarditis, pneumonia, abdominal hernia, pregnancy, intestinal ischemia, esophageal rupture, gastric volvulus, hepatitis.  Labs: I ordered reviewed and interpreted labs which included CBC which shows mild normocytic anemia appears to be baseline, troponin elevated also baseline.  BMP with slightly elevated creatinine at patient's baseline, urine without evidence of infection. Imaging: I ordered and reviewed images which included . I independently visualized and interpreted all imaging. There are no acute, significant findings on today's images. EKG: Ventricular rate of 82 without abnormality. Consults: MDM: Patient seen and shared visit with Dr. Langston Masker. Suspect he is having long COVID symptoms of fatigue and malaise.  No evidence of infection on work-up today.  Patient has a history of GERD.  He is having pain after eating and drinking and took Mylanta at home which helped with his pain.  I suspect gastritis.  I have advised that he use a bland diet, he may use Mylanta.  Nexium ordered and close outpatient follow-up with his primary care physician.  Patient appears otherwise appropriate for discharge at this time.  I have no suspicion for ACS or bowel obstruction. Patient disposition:The patient appears reasonably screened and/or stabilized for discharge and I doubt any other medical condition or other [facility] requiring further screening, evaluation, or treatment in the ED at this time prior  to discharge. I have discussed lab and/or imaging findings with the patient and answered all questions/concerns to the best of my ability.I have discussed return precautions and OP follow up.    Final Clinical Impression(s) / ED Diagnoses Final diagnoses:  Epigastric pain  Malaise and  fatigue  Generalized body aches    Rx / DC Orders ED Discharge Orders         Ordered    esomeprazole (NEXIUM) 40 MG capsule  Daily        11/15/20 1959           Margarita Mail, PA-C 11/15/20 2003    Wyvonnia Dusky, MD 11/15/20 2111

## 2020-11-15 NOTE — ED Triage Notes (Signed)
Pt c/o of left chest pain radiating to his back x 2 days.

## 2020-11-15 NOTE — ED Notes (Signed)
Pt requesting pain medication, c/o pain to back and abd, requesting a snack. ED Provider at bedside.

## 2020-11-15 NOTE — Discharge Instructions (Addendum)
I suspect you may have gastritis or an ulcer. I am discharging you with medication for treatment. Please follow the diet in the instructions. Follow up with your Primary care doctor as soon as possible. You may take mylanta for pain relief as well. Contact a health care provider if: Your symptoms get worse. Your symptoms return after treatment. Get help right away if: You vomit blood or material that looks like coffee grounds. You have black or dark red stools. You are unable to keep fluids down. Your abdominal pain gets worse. You have a fever. You do not feel better after one week.

## 2020-11-15 NOTE — ED Notes (Signed)
Called and updated daughter Kanye Depree with pt consent, daughter's questions answered, she verbalizes understanding. Pt will be discharged.

## 2020-11-20 ENCOUNTER — Telehealth: Payer: Self-pay | Admitting: Internal Medicine

## 2020-11-20 MED ORDER — FLUCONAZOLE 200 MG PO TABS: ORAL_TABLET | ORAL | 0 refills | Status: DC

## 2020-11-20 NOTE — Telephone Encounter (Signed)
noted 

## 2020-11-20 NOTE — Telephone Encounter (Signed)
I called and spoke with patient's daughter.  He again noted improvement in his symptoms after most recent course of Diflucan, though his symptoms are now returned.  I will send in another course.  Discussed that if his symptoms do not completely resolve after this we may need to consider repeat EGD and she understands.  A follow-up visit in May already which I told him to keep.  Thank you

## 2020-11-20 NOTE — Telephone Encounter (Signed)
Dr. Abbey Chatters This pt has called again regarding needing a Rx for the yeast in his throat, please contact him because I have already sent you a message his yeast issue

## 2020-11-20 NOTE — Telephone Encounter (Signed)
Patient called and said that they have still not heard anything about a prescription for his yeast in his throat

## 2020-11-21 ENCOUNTER — Ambulatory Visit: Payer: Medicare HMO | Admitting: Cardiology

## 2020-11-21 NOTE — Progress Notes (Deleted)
Clinical Summary Gregory Mitchell is a 80 y.o.male  1. Chronic systolic HF - LVEF previously as low as 35-40%, repeat echo showed improvement to 45-50% in 04/2013. He has had a prior cath 11/2011 that showed non-obstructive disease - 05/2016 echo LVEF 54-00%, grade I diastolic dysfunction  -Jan 2021 admission with volume overload - Jan 2021 echo LVEF 25-30%, grade II DDX, ,normal RV, mild MR,  - Jan 2021 cath: normal coronaries. CI 3, mean PA 17, PCWP 17, LVEDP 13   09/2020 echo LVEF 20-25%, grade III dd, normal RV  -followed in CHF clinic, though difficult transportation and will f/u with them as needed -     2. COPD - tobacco x 20 years, quit 40 years ago.  - followed by Dr Koleen Nimrod.  - recent exacerbation, has been on steroids and abx recently   4. HTN  - has not taken meds yet today - he is also currently on prednisone.    5. HL - muscle aches on statinprevoiusly -currently diet controlled   6. Bilatearl PE - diagnosed during 03/2019 [facility] admission -no prior history of blood clots. By historyappearsunprovoked. - he is on eliquis  7. Epigastric pain/Chest pain - long history of atypical chest pain - cath 2013 2021 with nonobstructive disease. Stress tests 2016 and 2017 no ischemia -often in the past have resovled with treatment of his COPD with nebs and steroids - also with prior candida esophagitis by EGD, symptoms improved with diflucan. GI is trying a repeat course.   Past Medical History:  Diagnosis Date  . Asthma   . Chronic renal insufficiency   . COPD (chronic obstructive pulmonary disease) ([place])   . Heart failure (HCC)    Acute on chronic combined systolic and diastolic heart failure  . Hypertension   . Pulmonary emboli ([place]) 03/2019  . Renal insufficiency   . Type 2 diabetes mellitus (HCC)      Allergies  Allergen Reactions  . Amoxicillin Other (See Comments)    Makes eyes turn yellow and burn  . Ciprofloxacin      Pt not sure  . Sulfa Antibiotics Rash    Pt not sure of reaction     Current Outpatient Medications  Medication Sig Dispense Refill  . albuterol (VENTOLIN HFA) 108 (90 Base) MCG/ACT inhaler Inhale 2 puffs into the lungs every 6 (six) hours as needed for wheezing or shortness of breath.     [name] apixaban (ELIQUIS) 5 MG TABS tablet Take 5 mg by mouth 2 (two) times daily.    . benzonatate (TESSALON PERLES) 100 MG capsule Take 1 capsule (100 mg total) by mouth 3 (three) times daily as needed for cough. 30 capsule 0  . carvedilol (COREG) 12.5 MG tablet Take 1 tablet (12.5 mg total) by mouth 2 (two) times daily. 60 tablet 11  . dapagliflozin propanediol (FARXIGA) 10 MG TABS tablet Take 1 tablet (10 mg total) by mouth daily before breakfast. 30 tablet 11  . diclofenac Sodium (VOLTAREN) 1 % GEL Apply 1 application topically 4 (four) times daily as needed (pain).    [name] esomeprazole (NEXIUM) 40 MG capsule Take 1 capsule (40 mg total) by mouth daily. 30 capsule 0  . fluconazole (DIFLUCAN) 200 MG tablet Take 2 tablets on day 1 then 1 tablet daily thereafter for a total of 21 days. 22 tablet 0  . furosemide (LASIX) 20 MG tablet Take 20 mg by mouth 2 (two) times daily.    . hydrALAZINE (APRESOLINE) 10  MG tablet Take 10 mg by mouth 3 (three) times daily.    [name] ipratropium-albuterol (DUONEB) 0.5-2.5 (3) MG/3ML SOLN Take 3 mLs by nebulization every 6 (six) hours as needed (for shortness of breath/wheezing).     . metFORMIN (GLUCOPHAGE-XR) 500 MG 24 hr tablet Take 1,000 mg by mouth 2 (two) times daily.    [name] omeprazole (PRILOSEC) 40 MG capsule Take 1 capsule (40 mg total) by mouth 2 (two) times daily before a meal. 30 minutes before breakfast and 30 minutes before dinner 60 capsule 5  . OXYGEN Inhale 2 L into the lungs at bedtime as needed (for shortness of breath).    . prednisoLONE sodium phosphate (PEDIAPRED) 6.7 (5 Base) MG/5ML SOLN TAKE TWO TEASPOONFULS (10 ML) BY MOUTH EVERY DAY    . predniSONE (DELTASONE)  10 MG tablet Take 40 mg daily for 1 day, 30 mg daily for 1 day, 20 mg daily for 1 days,10 mg daily for 1 day, then stop 10 tablet 0  . sacubitril-valsartan (ENTRESTO) 24-26 MG Take 1 tablet by mouth 2 (two) times daily. 60 tablet 11  . SYMBICORT 160-4.5 MCG/ACT inhaler Inhale 2 puffs into the lungs in the morning and at bedtime.    . tamsulosin (FLOMAX) 0.4 MG CAPS capsule Take 1 capsule (0.4 mg total) by mouth daily. (Patient taking differently: Take 0.4 mg by mouth 2 (two) times daily.) 30 capsule 0  . vitamin C (ASCORBIC ACID) 500 MG tablet Take 500 mg by mouth daily.     No current facility-administered medications for this visit.     Past Surgical History:  Procedure Laterality Date  . APPENDECTOMY    . BALLOON DILATION N/A 03/18/2020   Procedure: BALLOON DILATION;  Surgeon: Eloise Harman, DO;  [facility];  Service: Endoscopy;  Laterality: N/A;  . CHOLECYSTECTOMY    . COLONOSCOPY  10/2006   Dr.Anwar:normal  . ESOPHAGOGASTRODUODENOSCOPY (EGD) WITH PROPOFOL N/A 03/18/2020   Procedure: ESOPHAGOGASTRODUODENOSCOPY (EGD) WITH PROPOFOL;  Surgeon: Eloise Harman, DO;  [facility];  Service: Endoscopy;  Laterality: N/A;  2:30pm  . NASAL SINUS SURGERY    . NEPHRECTOMY     RIGHT (Question CA.  No further therapy needed.)  . RIGHT/LEFT HEART CATH AND CORONARY ANGIOGRAPHY N/A 09/03/2019   Procedure: RIGHT/LEFT HEART CATH AND CORONARY ANGIOGRAPHY;  Surgeon: Lorretta Harp, MD;  Location: [facility];  Service: Cardiovascular;  Laterality: N/A;     Allergies  Allergen Reactions  . Amoxicillin Other (See Comments)    Makes eyes turn yellow and burn  . Ciprofloxacin     Pt not sure  . Sulfa Antibiotics Rash    Pt not sure of reaction      Family History  Problem Relation Age of Onset  . Lung cancer Brother   . Stroke Mother   . Breast cancer Mother   . Colon cancer Neg Hx      Social History Mr. Ponzo reports that he quit smoking about  44 years ago. His smoking use included cigarettes. He started smoking about 63 years ago. He has a 15.00 pack-year smoking history. He quit smokeless tobacco use about 14 years ago.  His smokeless tobacco use included chew. Mr. Robison reports previous alcohol use.   Review of Systems CONSTITUTIONAL: No weight loss, fever, chills, weakness or fatigue.  HEENT: Eyes: No visual loss, blurred vision, double vision or yellow sclerae.No hearing loss, sneezing, congestion, runny nose or sore throat.  SKIN: No rash or  itching.  CARDIOVASCULAR:  RESPIRATORY: No shortness of breath, cough or sputum.  GASTROINTESTINAL: No anorexia, nausea, vomiting or diarrhea. No abdominal pain or blood.  GENITOURINARY: No burning on urination, no polyuria NEUROLOGICAL: No headache, dizziness, syncope, paralysis, ataxia, numbness or tingling in the extremities. No change in bowel or bladder control.  MUSCULOSKELETAL: No muscle, back pain, joint pain or stiffness.  LYMPHATICS: No enlarged nodes. No history of splenectomy.  PSYCHIATRIC: No history of depression or anxiety.  ENDOCRINOLOGIC: No reports of sweating, cold or heat intolerance. No polyuria or polydipsia.  [name]   Physical Examination There were no vitals filed for this visit. There were no vitals filed for this visit.  Gen: resting comfortably, no acute distress HEENT: no scleral icterus, pupils equal round and reactive, no palptable cervical adenopathy,  CV Resp: Clear to auscultation bilaterally GI: abdomen is soft, non-tender, non-distended, normal bowel sounds, no hepatosplenomegaly MSK: extremities are warm, no edema.  Skin: warm, no rash Neuro:  no focal deficits Psych: appropriate affect   Diagnostic Studies 04/2013 echo Study Conclusions  - Left ventricle: The cavity size was mildly dilated. Wall thickness was increased in a pattern of mild to moderateLVH. Systolic function was mildly reduced. The estimated ejection fraction was in  the range of 45% to 50%. There is mild global hypokinesis. There was an increased relative contribution of atrial contraction to ventricular filling. Doppler parameters are consistent with abnormal left ventricular relaxation (grade 1 diastolic dysfunction). Doppler parameters are consistent with high ventricular filling pressure. - Mitral valve: Calcified annulus. Mildly thickened leaflets . Trivial to mild regurgitation. - Left atrium: The atrium was mildly dilated. - Right ventricle: The cavity size was mildly dilated. Wall thickness was normal. Systolic function was mildly reduced. - Right atrium: The atrium was mildly dilated. - Pericardium, extracardiac: A trivial pericardial effusion was identified.   11/2011 Cath Procedural Findings:  Hemodynamics:  AO157/77 LV154/22  Coronary angiography:  Coronary dominance: Right  Left mainstem: Normal  Left anterior descending (LAD): Large vessel wrapping the apex. Mild lumina irregularities. D1 small normal. D2 small normal.  Left circumflex (LCx): AV group luminal irregularities. OM1 large and normal. PL x 2 small normal  Right coronary artery (RCA): Dominant. Long mid 25%. PDA normal. PL moderate and normal.   Left ventriculography: Left ventricle not injected  Final Conclusions: Mild coronary plaque. Non ischemic cardiomyopathy  Recommendations: Medical management.     12/2014 Exercise Cardiolite IMPRESSION: 1. Moderate size region of inferior wall myocardial scar. No ischemic territories seen. Overlying soft tissue attenuation artifact cannot entirely be ruled out.  2. Mild inferior wall hypokinesis.  3. Left ventricular ejection fraction 48%  4. Low to intermediate-risk stress test findings*.   05/2016 echo Study Conclusions  - Left ventricle: The  cavity size was normal. Wall thickness was increased in a pattern of mild LVH. Systolic function was normal. The estimated ejection fraction was in the range of 55% to 60%. Wall motion was normal; there were no regional wall motion abnormalities. Doppler parameters are consistent with abnormal left ventricular relaxation (grade 1 diastolic dysfunction). - Aortic valve: Mildly calcified annulus. Trileaflet; mildly thickened leaflets. Valve area (VTI): 4.03 cm^2. Valve area (Vmax): 4.25 cm^2. Valve area (Vmean): 4.49 cm^2. - Left atrium: The atrium was mildly to moderately dilated. - Atrial septum: No defect or patent foramen ovale was identified. - Technically adequate study.     Assessment and Plan   1. Chronic systolic HF - new diagnosis during recent admission - medical therapy limited  by renal dysfunction, also trifascicular block on EKG limit beta blocker dosing - euvolemic today. Has not taken meds today, unclear what his bp is when on meds. He will call with bp log on Monday, may tirate hydral/imdur pending results.  - repeat echo in next few months   2. HTN -elevated in clinic but has not taken meds yet - he will call Monday with a home bp log      Arnoldo Lenis, M.D., F.A.C.C.

## 2020-12-01 ENCOUNTER — Encounter: Payer: Self-pay | Admitting: *Deleted

## 2020-12-01 ENCOUNTER — Ambulatory Visit (INDEPENDENT_AMBULATORY_CARE_PROVIDER_SITE_OTHER): Payer: Medicare HMO | Admitting: Cardiology

## 2020-12-01 ENCOUNTER — Encounter: Payer: Self-pay | Admitting: Cardiology

## 2020-12-01 VITALS — BP 120/64 | HR 72 | Ht 66.0 in | Wt 138.2 lb

## 2020-12-01 DIAGNOSIS — I5022 Chronic systolic (congestive) heart failure: Secondary | ICD-10-CM | POA: Diagnosis not present

## 2020-12-01 DIAGNOSIS — I10 Essential (primary) hypertension: Secondary | ICD-10-CM | POA: Diagnosis not present

## 2020-12-01 MED ORDER — CARVEDILOL 6.25 MG PO TABS: 6.2500 mg | ORAL_TABLET | Freq: Two times a day (BID) | ORAL | 1 refills | Status: DC

## 2020-12-01 NOTE — Progress Notes (Signed)
Clinical Summary Gregory Mitchell is a 80 y.o.male  1. Chronic systolic HF - LVEF previously as low as 35-40%, repeat echo showed improvement to 45-50% in 04/2013. He has had a prior cath 11/2011 that showed non-obstructive disease - 05/2016 echo LVEF 03-47%, grade I diastolic dysfunction  -Jan 2021 admission with volume overload - Jan 2021 echo LVEF 25-30%, grade II DDX, ,normal RV, mild MR,  - Jan 2021 cath: normal coronaries. CI 3, mean PA 17, PCWP 17, LVEDP 13  09/2020 echo LVEF 20-25%, grade III dd, normal RV  -followed in CHF clinic, though difficult transportation and will f/u with them as needed    - pcp lowered lasix to 20mg  once daily per patient due to low bp's at visit. Since then has had some increasing edema.  - has had some increased SOB      2. COPD - tobacco x 20 years, quit 40 years ago.  - followed by Dr Koleen Nimrod.   3. HTN - compliant with meds   4. HL - muscle aches on statinprevoiusly -currently diet controlled   5. 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 By historyappearsunprovoked.  - off eliquis due anemia - 4/4 Hgb 8.7, down from 11.5. Stopped by pcp    6. Epigastric pain/Chest pain - long history of atypical chest pain - cath 2013 2021 with nonobstructive disease. Stress tests 2016 and 2017 no ischemia -often in the past have resovled with treatment of his COPD with nebs and steroids - also with prior candida esophagitis by EGD, symptoms improved with diflucan. GI is trying a repeat course.   -poor oral intake    Past Medical History:  Diagnosis Date  . Asthma   . Chronic renal insufficiency   . COPD (chronic obstructive pulmonary disease) ([place])   . Heart failure (HCC)    Acute on chronic combined systolic and diastolic heart failure  . Hypertension   . Pulmonary emboli ([place]) 03/2019  . Renal insufficiency   . Type 2 diabetes mellitus (HCC)       Allergies  Allergen Reactions  . Amoxicillin Other (See Comments)    Makes eyes turn yellow and burn  . Ciprofloxacin     Pt not sure  . Sulfa Antibiotics Rash    Pt not sure of reaction     Current Outpatient Medications  Medication Sig Dispense Refill  . albuterol (VENTOLIN HFA) 108 (90 Base) MCG/ACT inhaler Inhale 2 puffs into the lungs every 6 (six) hours as needed for wheezing or shortness of breath.     [name] apixaban (ELIQUIS) 5 MG TABS tablet Take 5 mg by mouth 2 (two) times daily.    . benzonatate (TESSALON PERLES) 100 MG capsule Take 1 capsule (100 mg total) by mouth 3 (three) times daily as needed for cough. 30 capsule 0  . carvedilol (COREG) 12.5 MG tablet Take 1 tablet (12.5 mg total) by mouth 2 (two) times daily. 60 tablet 11  . dapagliflozin propanediol (FARXIGA) 10 MG TABS tablet Take 1 tablet (10 mg total) by mouth daily before breakfast. 30 tablet 11  . diclofenac Sodium (VOLTAREN) 1 % GEL Apply 1 application topically 4 (four) times daily as needed (pain).    [name] esomeprazole (NEXIUM) 40 MG capsule Take 1 capsule (40 mg total) by mouth daily. 30 capsule 0  . fluconazole (DIFLUCAN) 200 MG tablet Take 2 tablets on day 1 then 1 tablet daily thereafter for a total of  21 days. 22 tablet 0  . furosemide (LASIX) 20 MG tablet Take 20 mg by mouth 2 (two) times daily.    . hydrALAZINE (APRESOLINE) 10 MG tablet Take 10 mg by mouth 3 (three) times daily.    [name] ipratropium-albuterol (DUONEB) 0.5-2.5 (3) MG/3ML SOLN Take 3 mLs by nebulization every 6 (six) hours as needed (for shortness of breath/wheezing).     . metFORMIN (GLUCOPHAGE-XR) 500 MG 24 hr tablet Take 1,000 mg by mouth 2 (two) times daily.    [name] omeprazole (PRILOSEC) 40 MG capsule Take 1 capsule (40 mg total) by mouth 2 (two) times daily before a meal. 30 minutes before breakfast and 30 minutes before dinner 60 capsule 5  . OXYGEN Inhale 2 L into the lungs at bedtime as needed (for shortness of breath).    . prednisoLONE  sodium phosphate (PEDIAPRED) 6.7 (5 Base) MG/5ML SOLN TAKE TWO TEASPOONFULS (10 ML) BY MOUTH EVERY DAY    . predniSONE (DELTASONE) 10 MG tablet Take 40 mg daily for 1 day, 30 mg daily for 1 day, 20 mg daily for 1 days,10 mg daily for 1 day, then stop 10 tablet 0  . sacubitril-valsartan (ENTRESTO) 24-26 MG Take 1 tablet by mouth 2 (two) times daily. 60 tablet 11  . SYMBICORT 160-4.5 MCG/ACT inhaler Inhale 2 puffs into the lungs in the morning and at bedtime.    . tamsulosin (FLOMAX) 0.4 MG CAPS capsule Take 1 capsule (0.4 mg total) by mouth daily. (Patient taking differently: Take 0.4 mg by mouth 2 (two) times daily.) 30 capsule 0  . vitamin C (ASCORBIC ACID) 500 MG tablet Take 500 mg by mouth daily.     No current facility-administered medications for this visit.     Past Surgical History:  Procedure Laterality Date  . APPENDECTOMY    . BALLOON DILATION N/A 03/18/2020   Procedure: BALLOON DILATION;  Surgeon: Eloise Harman, DO;  [facility];  Service: Endoscopy;  Laterality: N/A;  . CHOLECYSTECTOMY    . COLONOSCOPY  10/2006   Dr.Anwar:normal  . ESOPHAGOGASTRODUODENOSCOPY (EGD) WITH PROPOFOL N/A 03/18/2020   Procedure: ESOPHAGOGASTRODUODENOSCOPY (EGD) WITH PROPOFOL;  Surgeon: Eloise Harman, DO;  [facility];  Service: Endoscopy;  Laterality: N/A;  2:30pm  . NASAL SINUS SURGERY    . NEPHRECTOMY     RIGHT (Question CA.  No further therapy needed.)  . RIGHT/LEFT HEART CATH AND CORONARY ANGIOGRAPHY N/A 09/03/2019   Procedure: RIGHT/LEFT HEART CATH AND CORONARY ANGIOGRAPHY;  Surgeon: Lorretta Harp, MD;  Location: [facility];  Service: Cardiovascular;  Laterality: N/A;     Allergies  Allergen Reactions  . Amoxicillin Other (See Comments)    Makes eyes turn yellow and burn  . Ciprofloxacin     Pt not sure  . Sulfa Antibiotics Rash    Pt not sure of reaction      Family History  Problem Relation Age of Onset  . Lung cancer Brother   . Stroke  Mother   . Breast cancer Mother   . Colon cancer Neg Hx      Social History Gregory Mitchell reports that he quit smoking about 44 years ago. His smoking use included cigarettes. He started smoking about 63 years ago. He has a 15.00 pack-year smoking history. He quit smokeless tobacco use about 14 years ago.  His smokeless tobacco use included chew. Gregory Mitchell reports previous alcohol use.   Review of Systems CONSTITUTIONAL: No weight loss, fever, chills, weakness or  fatigue.  HEENT: Eyes: No visual loss, blurred vision, double vision or yellow sclerae.No hearing loss, sneezing, congestion, runny nose or sore throat.  SKIN: No rash or itching.  CARDIOVASCULAR: per hpi RESPIRATORY: No shortness of breath, cough or sputum.  GASTROINTESTINAL: No anorexia, nausea, vomiting or diarrhea. No abdominal pain or blood.  GENITOURINARY: No burning on urination, no polyuria NEUROLOGICAL: No headache, dizziness, syncope, paralysis, ataxia, numbness or tingling in the extremities. No change in bowel or bladder control.  MUSCULOSKELETAL: No muscle, back pain, joint pain or stiffness.  LYMPHATICS: No enlarged nodes. No history of splenectomy.  PSYCHIATRIC: No history of depression or anxiety.  ENDOCRINOLOGIC: No reports of sweating, cold or heat intolerance. No polyuria or polydipsia.  [name]   Physical Examination Today's Vitals   12/01/20 1505  BP: 120/64  Pulse: 72  SpO2: 93%  Weight: 138 lb 3.2 oz (62.7 kg)  Height: 5\' 6"  (1.676 m)   Body mass index is 22.31 kg/m.  Gen: resting comfortably, no acute distress HEENT: no scleral icterus, pupils equal round and reactive, no palptable cervical adenopathy,  CV: RRR, no m/r/g no jvd Resp: bilateral crackles GI: abdomen is soft, non-tender, non-distended, normal bowel sounds, no hepatosplenomegaly MSK: extremities are warm, 2+ bilateral LE edema Skin: warm, no rash Neuro:  no focal deficits Psych: appropriate affect   Diagnostic Studies 04/2013  echo Study Conclusions  - Left ventricle: The cavity size was mildly dilated. Wall thickness was increased in a pattern of mild to moderateLVH. Systolic function was mildly reduced. The estimated ejection fraction was in the range of 45% to 50%. There is mild global hypokinesis. There was an increased relative contribution of atrial contraction to ventricular filling. Doppler parameters are consistent with abnormal left ventricular relaxation (grade 1 diastolic dysfunction). Doppler parameters are consistent with high ventricular filling pressure. - Mitral valve: Calcified annulus. Mildly thickened leaflets . Trivial to mild regurgitation. - Left atrium: The atrium was mildly dilated. - Right ventricle: The cavity size was mildly dilated. Wall thickness was normal. Systolic function was mildly reduced. - Right atrium: The atrium was mildly dilated. - Pericardium, extracardiac: A trivial pericardial effusion was identified.   11/2011 Cath Procedural Findings:  Hemodynamics:  AO157/77 LV154/22  Coronary angiography:  Coronary dominance: Right  Left mainstem: Normal  Left anterior descending (LAD): Large vessel wrapping the apex. Mild lumina irregularities. D1 small normal. D2 small normal.  Left circumflex (LCx): AV group luminal irregularities. OM1 large and normal. PL x 2 small normal  Right coronary artery (RCA): Dominant. Long mid 25%. PDA normal. PL moderate and normal.   Left ventriculography: Left ventricle not injected  Final Conclusions: Mild coronary plaque. Non ischemic cardiomyopathy  Recommendations: Medical management.     12/2014 Exercise Cardiolite IMPRESSION: 1. Moderate size region of inferior wall myocardial scar. No ischemic territories seen. Overlying soft tissue attenuation artifact  cannot entirely be ruled out.  2. Mild inferior wall hypokinesis.  3. Left ventricular ejection fraction 48%  4. Low to intermediate-risk stress test findings*.   05/2016 echo Study Conclusions  - Left ventricle: The cavity size was normal. Wall thickness was increased in a pattern of mild LVH. Systolic function was normal. The estimated ejection fraction was in the range of 55% to 60%. Wall motion was normal; there were no regional wall motion abnormalities. Doppler parameters are consistent with abnormal left ventricular relaxation (grade 1 diastolic dysfunction). - Aortic valve: Mildly calcified annulus. Trileaflet; mildly thickened leaflets. Valve area (VTI): 4.03 cm^2. Valve area (Vmax): 4.25  cm^2. Valve area (Vmean): 4.49 cm^2. - Left atrium: The atrium was mildly to moderately dilated. - Atrial septum: No defect or patent foramen ovale was identified. - Technically adequate study.   Jan 2021 cath History obtained from chart review.Gregory L Hairstonis a 80 y.o.malewith a hx of longstanding chest pain with nonobstructive coronary artery disease by cardiac catheterization in 4401, chronic systolic heart failure with evidence of improved LV function in the past (secondary to nonischemic cardiomyopathy), COPD, hypertension, hyperlipidemia, prior pulmonary embolism, chronic kidney disease and diabeteswho is being seen today for the evaluation of decompensated heart failure in the setting of reduced LV function and elevated troponinat the request of Dr. Dyann Kief.   IMPRESSION: Gregory Mitchell has normal coronary arteries and low filling pressures suggesting that he has been adequately or over diuresed.  He did have a high V waves suggesting that he potentially has significant mitral regurgitation.  His systolic pressures were in the mid to high 90s and I therefore gave him a 250 cc bolus of saline given his low LVEDP and wedge.  He will need guideline directed  optimal medical therapy for his LV dysfunction.  The sheath removed and a TR band was placed on the right wrist to achieve patent hemostasis.  The patient left lab in stable condition.  His renal function be carefully monitored.  Dr. Davina Poke was notified of these results.   Assessment and Plan  1. Chronic systolic HF - some increased edema, pcp had lowered lasix due to low bp's during there visit according to patnet - restart lasix 20mg  bid, lower coreg to 6.25mg  bid. Request pcp records    2. HTN -at goal, lowering coreg due to reported low bp's at recent pcp visit.      Arnoldo Lenis, M.D.

## 2020-12-01 NOTE — Patient Instructions (Signed)
Your physician recommends that you schedule a follow-up appointment in: [place] physician has recommended you make the following change in your medication:   DECREASE COREG 6.25 MG TWICE DAILY   TAKE LASIX 20 MG TWICE DAILY   Thank you for choosing [facility]!!

## 2020-12-11 ENCOUNTER — Encounter: Payer: Self-pay | Admitting: Internal Medicine

## 2020-12-11 ENCOUNTER — Other Ambulatory Visit: Payer: Self-pay

## 2020-12-11 ENCOUNTER — Ambulatory Visit: Payer: Medicare HMO | Admitting: Internal Medicine

## 2020-12-11 VITALS — BP 109/56 | HR 95 | Temp 97.7°F | Ht 66.0 in | Wt 136.2 lb

## 2020-12-11 DIAGNOSIS — R0789 Other chest pain: Secondary | ICD-10-CM | POA: Diagnosis not present

## 2020-12-11 DIAGNOSIS — R1319 Other dysphagia: Secondary | ICD-10-CM | POA: Diagnosis not present

## 2020-12-11 DIAGNOSIS — B3781 Candidal esophagitis: Secondary | ICD-10-CM | POA: Diagnosis not present

## 2020-12-11 MED ORDER — DEXLANSOPRAZOLE 60 MG PO CPDR: 60.0000 mg | DELAYED_RELEASE_CAPSULE | Freq: Every day | ORAL | 5 refills | Status: DC

## 2020-12-11 NOTE — Progress Notes (Signed)
Referring Provider: Neale Burly, MD Primary Care Physician:  Neale Burly, MD Primary GI:  Dr. Abbey Chatters  Chief Complaint  Patient presents with  . Dysphagia  . Gastroesophageal Reflux    HPI:   Gregory Mitchell is a 80 y.o. male who presents to the clinic today for follow-up visit.  Underwent EGD in August 2021 for odynophagia, chest pain, dysphagia and was found to have severe candidal esophagitis.  He has received 3 extended courses of Diflucan.  His symptoms resolved at the conclusion of these courses of this and return thereafter.  He again states his symptoms are back.  Notes dysphagia, chest pain.  Also reports acid taste in his mouth constantly.  Previously trialed on Nexium which did not help.  Previously on pantoprazole which did not help.  Past Medical History:  Diagnosis Date  . Asthma   . Chronic renal insufficiency   . COPD (chronic obstructive pulmonary disease) ([place])   . Heart failure (H[facility])    Acute on chronic combined systolic and diastolic heart failure  . Hypertension   . Pulmonary emboli ([place]) 03/2019  . Renal insufficiency   . Type 2 diabetes mellitus ([place])     Past Surgical History:  Procedure Laterality Date  . APPENDECTOMY    . BALLOON DILATION N/A 03/18/2020   Procedure: BALLOON DILATION;  Surgeon: Eloise Harman, DO;  Location: [facility];  Service: Endoscopy;  Laterality: N/A;  . CHOLECYSTECTOMY    . COLONOSCOPY  10/2006   Dr.Anwar:normal  . ESOPHAGOGASTRODUODENOSCOPY (EGD) WITH PROPOFOL N/A 03/18/2020   Procedure: ESOPHAGOGASTRODUODENOSCOPY (EGD) WITH PROPOFOL;  Surgeon: Eloise Harman, DO;  Location: [facility];  Service: Endoscopy;  Laterality: N/A;  2:30pm  . NASAL SINUS SURGERY    . NEPHRECTOMY     RIGHT (Question CA.  No further therapy needed.)  . RIGHT/LEFT HEART CATH AND CORONARY ANGIOGRAPHY N/A 09/03/2019   Procedure: RIGHT/LEFT HEART CATH AND CORONARY ANGIOGRAPHY;  Surgeon: Lorretta Harp, MD;  Location: [facility];  Service: Cardiovascular;  Laterality: N/A;    Current Outpatient Medications  Medication Sig Dispense Refill  . albuterol (VENTOLIN HFA) 108 (90 Base) MCG/ACT inhaler Inhale 2 puffs into the lungs every 6 (six) hours as needed for wheezing or shortness of breath.     . carvedilol (COREG) 6.25 MG tablet Take 1 tablet (6.25 mg total) by mouth 2 (two) times daily. 180 tablet 1  . dapagliflozin propanediol (FARXIGA) 10 MG TABS tablet Take 1 tablet (10 mg total) by mouth daily before breakfast. 30 tablet 11  . dexlansoprazole (DEXILANT) 60 MG capsule Take 1 capsule (60 mg total) by mouth daily. 30 capsule 5  . diclofenac Sodium (VOLTAREN) 1 % GEL Apply 1 application topically 4 (four) times daily as needed (pain).    . furosemide (LASIX) 20 MG tablet Take 20 mg by mouth daily.    [name] HYDROcodone-acetaminophen (NORCO) 7.5-325 MG tablet Take 1 tablet by mouth 2 (two) times daily as needed.    [name] ipratropium-albuterol (DUONEB) 0.5-2.5 (3) MG/3ML SOLN Take 3 mLs by nebulization every 6 (six) hours as needed (for shortness of breath/wheezing).     . metFORMIN (GLUCOPHAGE-XR) 500 MG 24 hr tablet Take 1,000 mg by mouth 2 (two) times daily.    . OXYGEN Inhale 2 L into the lungs at bedtime as needed (for shortness of breath).    . sacubitril-valsartan (ENTRESTO) 24-26 MG Take 1 tablet by mouth 2 (two) times daily. 60 tablet  11  . SM SENNA LAXATIVE 8.6 MG tablet Take 2 tablets by mouth 2 (two) times daily.    . tamsulosin (FLOMAX) 0.4 MG CAPS capsule Take 1 capsule (0.4 mg total) by mouth daily. (Patient taking differently: Take 0.8 mg by mouth 2 (two) times daily.) 30 capsule 0  . ciprofloxacin (CIPRO) 500 MG tablet Take 500 mg by mouth 2 (two) times daily. (Patient not taking: Reported on 12/11/2020)     No current facility-administered medications for this visit.    Allergies as of 12/11/2020 - Review Complete 12/11/2020  Allergen Reaction Noted  . Amoxicillin Other (See Comments) 11/26/2011  .  Ciprofloxacin  11/26/2011  . Sulfa antibiotics Rash 11/26/2011    Family History  Problem Relation Age of Onset  . Lung cancer Brother   . Stroke Mother   . Breast cancer Mother   . Colon cancer Neg Hx     Social History   Socioeconomic History  . Marital status: Widowed    Spouse name: Not on file  . Number of children: Not on file  . Years of education: Not on file  . Highest education level: Not on file  Occupational History  . Not on file  Tobacco Use  . Smoking status: Former Smoker    Packs/day: 1.00    Years: 15.00    Pack years: 15.00    Types: Cigarettes    Start date: 07/14/1957    Quit date: 08/09/1976    Years since quitting: 44.3  . Smokeless tobacco: Former Systems developer    Types: Chew    Quit date: 02/10/2006  . Tobacco comment: chewed tobacco for 10 years  Vaping Use  . Vaping Use: Never used  Substance and Sexual Activity  . Alcohol use: Not Currently    Alcohol/week: 0.0 standard drinks    Comment: no etoh since he was in his 42s  . Drug use: Never  . Sexual activity: Yes  Other Topics Concern  . Not on file  Social History Narrative   Lives with wife.  Three children.     Social Determinants of Health   Financial Resource Strain: Not on file  Food Insecurity: Not on file  Transportation Needs: Not on file  Physical Activity: Not on file  Stress: Not on file  Social Connections: Not on file    Subjective: Review of Systems  Constitutional: Negative for chills and fever.  HENT: Negative for congestion and hearing loss.   Eyes: Negative for blurred vision and double vision.  Respiratory: Negative for cough and shortness of breath.   Cardiovascular: Positive for chest pain. Negative for palpitations.  Gastrointestinal: Positive for heartburn. Negative for abdominal pain, blood in stool, constipation, diarrhea, melena and vomiting.       Dysphagia  Genitourinary: Negative for dysuria and urgency.  Musculoskeletal: Negative for joint pain and  myalgias.  Skin: Negative for itching and rash.  Neurological: Negative for dizziness and headaches.  Psychiatric/Behavioral: Negative for depression. The patient is not nervous/anxious.      Objective: BP (!) 109/56   Pulse 95   Temp 97.7 F (36.5 C) (Temporal)   Ht 5\' 6"  (1.676 m)   Wt 136 lb 3.2 oz (61.8 kg)   BMI 21.98 kg/m  Physical Exam Constitutional:      Appearance: Normal appearance.  HENT:     Head: Normocephalic and atraumatic.  Eyes:     Extraocular Movements: Extraocular movements intact.     Conjunctiva/sclera: Conjunctivae normal.  Cardiovascular:  Rate and Rhythm: Normal rate and regular rhythm.  Pulmonary:     Effort: Pulmonary effort is normal.     Comments: Bilateral crackles with scattered expiratory wheezing Abdominal:     General: Bowel sounds are normal.     Palpations: Abdomen is soft.  Musculoskeletal:        General: Normal range of motion.     Cervical back: Normal range of motion and neck supple.  Skin:    General: Skin is warm.  Neurological:     General: No focal deficit present.     Mental Status: He is alert and oriented to person, place, and time.  Psychiatric:        Mood and Affect: Mood normal.        Behavior: Behavior normal.      Assessment: *Candidal esophagitis *Dysphagia *Chest pain *Acid reflux  Plan: Patient has now been treated for candidal esophagitis with 3 courses of Diflucan.  He only seems to get better at the conclusion of courses though his symptoms do return.  Also complaining of acid taste in his mouth today as well as worsening anemia with most recent hemoglobin 9.2.  Discussed options in depth with him today.  Given his worsening anemia, ongoing symptoms, ideally we would perform repeat EGD to further evaluate.  I would send yeast to culture to rule out resistance.  However given his underlying significant cardiac and pulmonary issues, he would be high risk for complications from an anesthesia  standpoint.  After further discussion with patient and daughter, we will trial PPI therapy again for 4 to 6 weeks.  If he is not improved at that time then we will consider EGD. I am going to place him back on Dexilant 60 mg daily monitor for improvement.  If patient unable to afford, we will switch to omeprazole 40 mg twice daily.  Patient follow-up in 3 months or sooner if needed.  12/11/2020 3:36 PM   Disclaimer: This note was dictated with voice recognition software. Similar sounding words can inadvertently be transcribed and may not be corrected upon review.

## 2020-12-11 NOTE — Patient Instructions (Signed)
I am going to try you on a new medication called Dexilant 60 mg daily.  I want you to take this 30 minutes before breakfast.  If this medication is too expensive then please call our office and I will send in an alternative medication.  If your symptoms do not improve in 4 to 6 weeks then give [place] a call and we may need to consider repeat EGD though you are high risk given underlying heart and lung issues.  Otherwise follow-up with me in 3 months.  At [facility] we value your feedback. You may receive a survey about your visit today. Please share your experience as we strive to create trusting relationships with our patients to provide genuine, compassionate, quality care.  We appreciate your understanding and patience as we review any laboratory studies, imaging, and other diagnostic tests that are ordered as we care for you. Our office policy is 5 business days for review of these results, and any emergent or urgent results are addressed in a timely manner for your best interest. If you do not hear from our office in 1 week, please contact us.   We also encourage the use of MyChart, which contains your medical information for your review as well. If you are not enrolled in this feature, an access code is on this after visit summary for your convenience. Thank you for allowing [place] to be involved in your care.  It was great to see you today!  I hope you have a great rest of your spring!!    Elon Alas. Abbey Chatters, D.O. Gastroenterology and Hepatology [facility]

## 2020-12-30 ENCOUNTER — Encounter: Payer: Self-pay | Admitting: *Deleted

## 2020-12-30 ENCOUNTER — Ambulatory Visit: Payer: Medicare HMO | Admitting: Family Medicine

## 2020-12-30 ENCOUNTER — Encounter: Payer: Self-pay | Admitting: Family Medicine

## 2020-12-30 VITALS — BP 100/56 | HR 82 | Ht 66.0 in | Wt 128.2 lb

## 2020-12-30 DIAGNOSIS — I5022 Chronic systolic (congestive) heart failure: Secondary | ICD-10-CM

## 2020-12-30 DIAGNOSIS — I10 Essential (primary) hypertension: Secondary | ICD-10-CM | POA: Diagnosis not present

## 2020-12-30 NOTE — Patient Instructions (Addendum)

## 2020-12-30 NOTE — Progress Notes (Signed)
Cardiology Office Note  Date: 12/30/2020   ID: Gregory Mitchell, DOB May 22, 1941, MRN [id number]  PCP:  Neale Burly, MD  Cardiologist:  Carlyle Dolly, MD Electrophysiologist:  None   Chief Complaint: 1 month follow-up.  DC from [facility] on 12/08/2020  History of Present Illness: Gregory Mitchell is a 80 y.o. male with a history of hypertension, PE, renal insufficiency, type 2 diabetes, COPD, heart failure.   Last seen by Dr. Harl Bowie on 12/01/2020.  He was following with CHF clinic.  His PCP had recently lowered his Lasix to 20 mg once daily due to low blood pressures.  Since that time he has had some increasing edema and some increased shortness of breath.  Previous smoker or 20 years but quit 40 years prior followed by pulmonary.  Was compliant with his antihypertensive medications.  Hyperlipidemia currently controlled on diet due to previous muscle aches on statin.  Due to increased edema he was restarted on Lasix 20 mg p.o. twice daily.  Coreg was lowered to 6.25 mg p.o. twice daily.  PCP records were requested.  Blood pressure was at goal.  Coreg was lowered due to reported low BPs at recent PCP visit.  Recent admission to [facility]  12/26/2020 for chief complaint of abdominal pain/COPD/adult failure to thrive/type II non-insulin-dependent diabetes, nonischemic cardiomyopathy, CKD stage III, transient chest pain.  Had recently been admitted on 4/29 through 12/08/2020 for worsening shortness of breath and was ruled out for  PE by CTA.  He was placed on IV Lasix for management of chronic severe heart failure with EF of 25 to 30%.  He was treated for COPD exacerbation.  His admitting diagnoses were congestive heart failure, bronchopneumonia, diabetes, CKD.  He had been complaining of abdominal pain x1 week with occasional chest pain.  CT scan showed pulmonary nodules measuring up to 7 mm.  A noncontrast CT at 3 to 6 months was recommended.  His losartan was stopped at discharge from recent hospital  stay.  He was discharged on Lasix 20 mg p.o. twice daily.  Prednisone taper, carvedilol 6.25 mg p.o. twice daily.  Entresto 24/26 mg p.o. twice daily, Farxiga 5 mg daily, hydralazine 10 mg daily.  He is here for follow-up today.  He states he feels weak and has no energy.  Blood pressure is on the low side at 100/56.  Heart rate is 82.  Patient's daughter states there was some confusion at discharge on medications to be taken.  He states he was not told he needed to start Levaquin.  He has not started the Levaquin as of yet. He brought his home medications with him. Delene Loll was not in the medications he brought. He and daughter were unsure as to whether he was currently taking his Entresto. Daughter stated she had called the medication in and planned to pick it up later.  His initial O2 sats on arrival were 83%.  Nursing staff started him on nasal cannula O2 in the office when he arrived.  He is normally on home oxygen therapy at [facility]. He denies any anginal symptoms, lightheadedness, dizziness, near syncopal or syncopal episodes. No CVA or TIA like symptoms, PND or orthopnea, claudication like symptoms, DVT or PE like symptoms, or lower extremity edema.   Past Medical History:  Diagnosis Date  . Asthma   . Chronic renal insufficiency   . COPD (chronic obstructive pulmonary disease) ([place])   . Heart failure (HCC)    Acute on chronic combined systolic and  diastolic heart failure  . Hypertension   . Pulmonary emboli ([place]) 03/2019  . Renal insufficiency   . Type 2 diabetes mellitus ([place])     Past Surgical History:  Procedure Laterality Date  . APPENDECTOMY    . BALLOON DILATION N/A 03/18/2020   Procedure: BALLOON DILATION;  Surgeon: Eloise Harman, DO;  Location[facility];  Service: Endoscopy;  Laterality: N/A;  . CHOLECYSTECTOMY    . COLONOSCOPY  10/2006   Dr.Anwar:normal  . ESOPHAGOGASTRODUODENOSCOPY (EGD) WITH PROPOFOL N/A 03/18/2020   Procedure: ESOPHAGOGASTRODUODENOSCOPY (EGD) WITH  PROPOFOL;  Surgeon: Eloise Harman, DO;  Location: [facility];  Service: Endoscopy;  Laterality: N/A;  2:30pm  . NASAL SINUS SURGERY    . NEPHRECTOMY     RIGHT (Question CA.  No further therapy needed.)  . RIGHT/LEFT HEART CATH AND CORONARY ANGIOGRAPHY N/A 09/03/2019   Procedure: RIGHT/LEFT HEART CATH AND CORONARY ANGIOGRAPHY;  Surgeon: Lorretta Harp, MD;  Location: [place] CV LAB;  Service: Cardiovascular;  Laterality: N/A;    Current Outpatient Medications  Medication Sig Dispense Refill  . albuterol (VENTOLIN HFA) 108 (90 Base) MCG/ACT inhaler Inhale 2 puffs into the lungs every 6 (six) hours as needed for wheezing or shortness of breath.     [name] apixaban (ELIQUIS) 5 MG TABS tablet Take 5 mg by mouth 2 (two) times daily.    . carvedilol (COREG) 6.25 MG tablet Take 1 tablet (6.25 mg total) by mouth 2 (two) times daily. 180 tablet 1  . ciprofloxacin (CIPRO) 500 MG tablet Take 500 mg by mouth 2 (two) times daily.    [name] DALIRESP 250 MCG TABS Take 1 tablet by mouth daily.    . dapagliflozin propanediol (FARXIGA) 10 MG TABS tablet Take 1 tablet (10 mg total) by mouth daily before breakfast. 30 tablet 11  . dexlansoprazole (DEXILANT) 60 MG capsule Take 1 capsule (60 mg total) by mouth daily. 30 capsule 5  . diclofenac Sodium (VOLTAREN) 1 % GEL Apply 1 application topically 4 (four) times daily as needed (pain).    . furosemide (LASIX) 20 MG tablet Take 20 mg by mouth 2 (two) times daily.    [name] HYDROcodone-acetaminophen (NORCO) 7.5-325 MG tablet Take 1 tablet by mouth 2 (two) times daily as needed.    [name] ipratropium-albuterol (DUONEB) 0.5-2.5 (3) MG/3ML SOLN Take 3 mLs by nebulization every 6 (six) hours as needed (for shortness of breath/wheezing).     . metFORMIN (GLUCOPHAGE-XR) 500 MG 24 hr tablet Take 1,000 mg by mouth 2 (two) times daily.    . mirtazapine (REMERON) 15 MG tablet Take 15 mg by mouth at bedtime.    . OXYGEN Inhale 2 L into the lungs at bedtime as needed (for shortness of  breath).    . SM SENNA LAXATIVE 8.6 MG tablet Take 2 tablets by mouth 2 (two) times daily.    . tamsulosin (FLOMAX) 0.4 MG CAPS capsule Take 1 capsule (0.4 mg total) by mouth daily. (Patient taking differently: Take 0.8 mg by mouth 2 (two) times daily.) 30 capsule 0   No current facility-administered medications for this visit.   Allergies:  Amoxicillin, Ciprofloxacin, and Sulfa antibiotics   Social History: The patient  reports that he quit smoking about 44 years ago. His smoking use included cigarettes. He started smoking about 63 years ago. He has a 15.00 pack-year smoking history. He quit smokeless tobacco use about 14 years ago.  His smokeless tobacco use included chew. He reports previous alcohol use. He  reports that he does not use drugs.   Family History: The patient's family history includes Breast cancer in his mother; Lung cancer in his brother; Stroke in his mother.   ROS:  Please see the history of present illness. Otherwise, complete review of systems is positive for none.  All other systems are reviewed and negative.   Physical Exam: VS:  BP (!) 100/56   Pulse 82   Ht 5\' 6"  (1.676 m)   Wt 128 lb 3.2 oz (58.2 kg)   SpO2 (!) 83%   BMI 20.69 kg/m , BMI Body mass index is 20.69 kg/m.  Wt Readings from Last 3 Encounters:  12/30/20 128 lb 3.2 oz (58.2 kg)  12/11/20 136 lb 3.2 oz (61.8 kg)  12/01/20 138 lb 3.2 oz (62.7 kg)    General: Thin appearing patient appears comfortable at rest. Neck: Supple, no elevated JVP or carotid bruits, no thyromegaly. Lungs: Crackles in left posterior lung fields, nonlabored breathing at rest. Cardiac: Regular rate and rhythm, no S3 or significant systolic murmur, no pericardial rub. Extremities: No pitting edema, distal pulses 2+. Skin: Warm and dry. Musculoskeletal: No kyphosis. Neuropsychiatric: Alert and oriented x3, affect grossly appropriate.  ECG: UNCR 12/26/2020 Normal sinus rhythm 99 ,Left axis deviation ,Right bundle branch block  , Anterior infarct, age undetermined T wave abnormality, consider lateral ischemia   Recent Labwork: 10/04/2020: B Natriuretic Peptide 1,309.0 10/14/2020: Magnesium 2.4 10/16/2020: ALT 66; AST 57 11/15/2020: BUN 16; Creatinine, Ser 1.29; Hemoglobin 10.3; Platelets 348; Potassium 3.9; Sodium 143  No results found for: CHOL, TRIG, HDL, CHOLHDL, VLDL, LDLCALC, LDLDIRECT  Other Studies Reviewed Today:    Echocardiogram 10/05/2020  1. Left ventricular ejection fraction, by estimation, is 20 to 25%. The left ventricle has severely decreased function. The left ventricle demonstrates global hypokinesis. The left ventricular internal cavity size was severely dilated. Left ventricular diastolic parameters are consistent with Grade III diastolic dysfunction (restrictive). 2. Right ventricular systolic function is normal. The right ventricular size is normal. There is normal pulmonary artery systolic pressure. 3. Left atrial size was mildly dilated. 4. The mitral valve is normal in structure. Trivial mitral valve regurgitation. No evidence of mitral stenosis. 5. The aortic valve is tricuspid. Aortic valve regurgitation is not visualized. No aortic stenosis is present. 6. The inferior vena cava is normal in size with greater than 50% respiratory variability, suggesting right atrial pressure of 3 mmHg.      CT Abdomen Pelvis Wo Contrast 01/03/2021 [facility]  Anatomical Region Laterality Modality  Abdomen -- Computed Tomography  Pelvis -- --    Impression Performed by [facility] RAD 1. Interval worsening of bibasilar findings likely represent  infection/inflammation. Pulmonary nodules measure up to 7 mm.  Non-contrast chest CT at 3-6 months is recommended. If the nodules  are stable at time of repeat CT, then future CT at 18-24 months  (from today's scan) is considered optional for low-risk patients,  but is recommended for high-risk patients. This recommendation  follows the consensus  statement: Guidelines for Management of  Incidental Pulmonary Nodules Detected on CT Images: Fromthe  Fleischner Society 2017; Radiology 2017; [phone].  2. Irregular urinary bladder wall thickening along the left anterior  portion which previously was noted to be involved in a left inguinal  hernia. Finding could be related to inflammation versus underlying  lesion.  3. Question rectal wall thickening. No associated perirectal fat  stranding to suggest definite inflammation. No definite  lymphadenopathy on this noncontrast study. Recommend correlation  with  nonemergent colonoscopy.  4. Scattered colonic diverticulosis with no acute diverticulitis.  5. Status post right nephrectomy with limited evaluation on this  noncontrast study.  6. Aortic Atherosclerosis (ICD10-I70.0).  7. Cardiomegaly.         04/2013 echo Study Conclusions  - Left ventricle: The cavity size was mildly dilated. Wall thickness was increased in a pattern of mild to moderateLVH. Systolic function was mildly reduced. The estimated ejection fraction was in the range of 45% to 50%. There is mild global hypokinesis. There was an increased relative contribution of atrial contraction to ventricular filling. Doppler parameters are consistent with abnormal left ventricular relaxation (grade 1 diastolic dysfunction). Doppler parameters are consistent with high ventricular filling pressure. - Mitral valve: Calcified annulus. Mildly thickened leaflets . Trivial to mild regurgitation. - Left atrium: The atrium was mildly dilated. - Right ventricle: The cavity size was mildly dilated. Wall thickness was normal. Systolic function was mildly reduced. - Right atrium: The atrium was mildly dilated. - Pericardium, extracardiac: A trivial pericardial effusion was identified.   11/2011 Cath Procedural  Findings:  Hemodynamics:  AO157/77 LV154/22  Coronary angiography:  Coronary dominance: Right  Left mainstem: Normal  Left anterior descending (LAD): Large vessel wrapping the apex. Mild lumina irregularities. D1 small normal. D2 small normal.  Left circumflex (LCx): AV group luminal irregularities. OM1 large and normal. PL x 2 small normal  Right coronary artery (RCA): Dominant. Long mid 25%. PDA normal. PL moderate and normal.   Left ventriculography: Left ventricle not injected  Final Conclusions: Mild coronary plaque. Non ischemic cardiomyopathy  Recommendations: Medical management.     12/2014 Exercise Cardiolite IMPRESSION: 1. Moderate size region of inferior wall myocardial scar. No ischemic territories seen. Overlying soft tissue attenuation artifact cannot entirely be ruled out.  2. Mild inferior wall hypokinesis.  3. Left ventricular ejection fraction 48%  4. Low to intermediate-risk stress test findings*.   05/2016 echo Study Conclusions  - Left ventricle: The cavity size was normal. Wall thickness was increased in a pattern of mild LVH. Systolic function was normal. The estimated ejection fraction was in the range of 55% to 60%. Wall motion was normal; there were no regional wall motion abnormalities. Doppler parameters are consistent with abnormal left ventricular relaxation (grade 1 diastolic dysfunction). - Aortic valve: Mildly calcified annulus. Trileaflet; mildly thickened leaflets. Valve area (VTI): 4.03 cm^2. Valve area (Vmax): 4.25 cm^2. Valve area (Vmean): 4.49 cm^2. - Left atrium: The atrium was mildly to moderately dilated. - Atrial septum: No defect or patent foramen ovale was identified. - Technically adequate study.   Jan 2021 cath History obtained from chart review.Zuhair L  Hairstonis a 80 y.o.malewith a hx of longstanding chest pain with nonobstructive coronary artery disease by cardiac catheterization in 5638, chronic systolic heart failure with evidence of improved LV function in the past (secondary to nonischemic cardiomyopathy), COPD, hypertension, hyperlipidemia, prior pulmonary embolism, chronic kidney disease and diabeteswho is being seen today for the evaluation of decompensated heart failure in the setting of reduced LV function and elevated troponinat the request of Dr. Dyann Kief.   IMPRESSION:Mr. Barfield has normal coronary arteries and low filling pressures suggesting that he has been adequately or over diuresed. He did have a high V waves suggesting that he potentially has significant mitral regurgitation. His systolic pressures were in the mid to high 90s and I therefore gave him a 250 cc bolus of saline given his low LVEDP and wedge. He will need guideline directed optimal medical therapy for his LV  dysfunction. The sheath removed and a TR band was placed on the right wrist to achieve patent hemostasis. The patient left lab in stable condition. His renal function be carefully monitored. Dr. Davina Poke was notified of these results.   Assessment and Plan:  1. Chronic systolic heart failure ([place])   2. Essential hypertension    1. Chronic systolic heart failure (HCC) Complains of feeling tired with no energy, He has crackles in left posterior lung fields. His initial oxygen saturation on arrival was 83% . Nursing staff placed him on nasal 02 in the office.  He was discharged on Levaquin from [facility] on 12/08/2020. He states he was not informed he was to take Levaquin at discharge and had not picked it up.  He has lost some weight since last measurement approximately 8 lbs. Delene Loll was not among his medications he brought in today. Daughter stated she had just called the medication in to the pharmacy. Advised patient and daughter to make sure to pick up  the Levaquin and start taking it. Continue Carvedilol 6.25 mg po bid, Lasix 20mg  po bid, Entresto 24/26/ mg po bid.   2. Essential hypertension BP on the low side today at 100/56. Patient is asymptomatic. Continue carvedilol 6.25 mg po bid for now. May need to adjust if SBP decreases below 100. Losartan was recently discontinued due to low BP at recent hospital stay.  Medication Adjustments/Labs and Tests Ordered: Current medicines are reviewed at length with the patient today.  Concerns regarding medicines are outlined above.   Disposition: Follow-up with Dr Harl Bowie or APP 3 months  Signed, Levell July, NP 12/30/2020 2:48 PM    [facility] [place], [place], [place] [address] Phone: [phone]; Fax: [phone]

## 2021-02-11 ENCOUNTER — Ambulatory Visit (INDEPENDENT_AMBULATORY_CARE_PROVIDER_SITE_OTHER): Payer: Medicare HMO

## 2021-02-11 DIAGNOSIS — I5022 Chronic systolic (congestive) heart failure: Secondary | ICD-10-CM | POA: Diagnosis not present

## 2021-02-11 LAB — ECHOCARDIOGRAM COMPLETE
Area-P 1/2: 3.54 cm2
Calc EF: 37.3 %
MV M vel: 2.89 m/s
MV Peak grad: 33.4 mmHg
S' Lateral: 3.97 cm
Single Plane A2C EF: 35.9 %
Single Plane A4C EF: 38.2 %

## 2021-02-12 ENCOUNTER — Telehealth: Payer: Self-pay | Admitting: Cardiology

## 2021-02-12 NOTE — Telephone Encounter (Signed)
Pt's daughter noticed swelling on the right side of pt in the hands and feet Sunday morning while they were at [facility]. Pt's daughter stated she was unsure of what pt's weight is at the current time nor does she know how much weight he has gained as they do not have a scale. Pt's daughter also stated that pt is currently taking Furosemide- pt is to take 20 mg tablets 2x daily, but patient has been taking 40 mg in the am and 20 mg in the pm. Pt is also c/o cp, but pt's daughter stated that was an "everyday thing". I will forward to provider for recommendation/review.

## 2021-02-12 NOTE — Telephone Encounter (Signed)
Patient daughter is calling for echo results and patient is having a lot of swelling on his left side    Pt c/o swelling: STAT is pt has developed SOB within 24 hours  If swelling, where is the swelling located? Left side hand and feet  How much weight have you gained and in what time span? ?  Have you gained 3 pounds in a day or 5 pounds in a week? Not sure  Do you have a log of your daily weights (if so, list)? no  Are you currently taking a fluid pill? yes  Are you currently SOB? Not sure he is on oxygen  Have you traveled recently? no

## 2021-02-13 NOTE — Telephone Encounter (Signed)
If taking 40mg  in AM and 20mg  in pm of lasix and ongoing swelling, take 40mg  bid over the weekened and update [place] on Monday  Zandra Abts MD

## 2021-02-13 NOTE — Telephone Encounter (Signed)
Pt's daughter verbalized understanding medication changes for the weekend. Pt's daughter agreeable to update [place] Monday on how pt's swelling is.

## 2021-02-25 ENCOUNTER — Telehealth: Payer: Self-pay | Admitting: *Deleted

## 2021-02-25 NOTE — Telephone Encounter (Signed)
Laurine Blazer, LPN  01/27/3085  5:78 PM EDT Back to Top     Apolonio Schneiders (daughter) notified, copy to pcp.    Berlinda Last, [facility]  02/25/2021 10:48 AM EDT      Left a message for pt to return call for results.    Laurine Blazer, LPN  4/69/6295  2:84 PM EDT      Left message to return call.   Verta Ellen., NP  02/13/2021  8:51 AM EDT      Please call the patient and let him know the echocardiogram shows he still has some decreased pumping function of the heart.  But it has improved since the last echocardiogram in February of this year by about 10 to 15% which is much much better than the previous study.  We still have a little ways to go to get the pumping function better.  We may try to increase his Entresto dosage up to the next higher dosage if his blood pressure and his kidney function willtolerate the increased dose to help improve the pumping function.    Thank you   Verta Ellen, NP  02/13/2021 8:47 AM

## 2021-02-25 NOTE — Telephone Encounter (Signed)
Daughter Apolonio Schneiders) calling to give update on swelling per telephone call on 02/12/2021.  States that he is still having swelling in hands & feet.  Doses seem to be worse on the right side.  Still doing his Furosemide 40mg  twice a day. Did also have ED visit on 02/16/21 at [facility] (in care everywhere).  Was put on antibiotics & Prednisone.  No c/o SOB- hasn't been using his oxygen as much.  Does mention some chest pain which was also addressed at ED visit.  Patient has OV scheduled for 04/03/21 with Jonni Sanger already.   DAUGHTER CALLING BACK WITH WEIGHT

## 2021-02-26 NOTE — Telephone Encounter (Signed)
Daughter was supposed to be calling back with weight, no return call to present.

## 2021-03-02 NOTE — Telephone Encounter (Signed)
Having weight would help decide on any medication changes   Zandra Abts MD

## 2021-03-03 ENCOUNTER — Encounter: Payer: Self-pay | Admitting: Gastroenterology

## 2021-03-04 NOTE — Telephone Encounter (Signed)
Please see most recent pulmonary visit in care everywhere dated 03/02/21.  Weight at that visit was 145lb.  Also, looks like Echo was done as well.

## 2021-03-05 NOTE — Telephone Encounter (Signed)
Can I see him 320 on aug 2nd, too complex to try to sort out over the phone  Zandra Abts MD

## 2021-03-06 NOTE — Telephone Encounter (Signed)
Patient notified and verbalized understanding. 

## 2021-03-10 ENCOUNTER — Encounter: Payer: Self-pay | Admitting: Cardiology

## 2021-03-10 ENCOUNTER — Ambulatory Visit: Payer: Medicare HMO | Admitting: Cardiology

## 2021-03-10 VITALS — BP 122/50 | HR 73 | Ht 66.0 in | Wt 153.2 lb

## 2021-03-10 DIAGNOSIS — I5023 Acute on chronic systolic (congestive) heart failure: Secondary | ICD-10-CM | POA: Diagnosis not present

## 2021-03-10 MED ORDER — TORSEMIDE 20 MG PO TABS: 20.0000 mg | ORAL_TABLET | Freq: Two times a day (BID) | ORAL | 6 refills | Status: DC

## 2021-03-10 NOTE — Patient Instructions (Signed)
Medication Instructions:  Stop Lasix (Furosemide).   Begin Torsemide '20mg'$  twice a day. Continue all other medications.    Labwork: none  Testing/Procedures: none  Follow-Up: Keep as scheduled.   Any Other Special Instructions Will Be Listed Below (If Applicable). Please call the office on Friday with update on weights.   If you need a refill on your cardiac medications before your next appointment, please call your pharmacy.

## 2021-03-10 NOTE — Progress Notes (Signed)
Clinical Summary Gregory Mitchell is a 80 y.o.male seen today for a focused visit for recent issues with leg swelling and SOB    1. Chronic systolic HF - LVEF previously as low as 35-40%, repeat echo showed improvement to 45-50% in 04/2013. He has had a prior cath 11/2011 that showed non-obstructive disease  - 05/2016 echo LVEF 0000000, grade I diastolic dysfunction   - Jan 2021 admission with volume overload - Jan 2021 echo LVEF 25-30%, grade II DDX, ,normal RV, mild MR, - Jan 2021 cath: normal coronaries. CI 3, mean PA 17, PCWP 17, LVEDP 13    09/2020 echo LVEF 20-25%, grade III dd, normal RV    -followed in CHF clinic, though difficult transportation and will f/u with them as needed       02/2021 echo LVEF 30-35%, mild RV dysfunction CHF clnic had recommended cMRI  - recent increase in LE edema.  - weights hard to trend, had lost a significant amount of wait this year due to failure to thrive. In 10/18/19 he was 158 lbs. At last cards visit 12/30/20 was 128 lbs - 03/02/21 wt was 145 lbs - recent LE edema. Taking lasix '40mg'$  bid, with mild improvement    2. COPD - tobacco x 20 years, quit 40 years ago.    - followed by Dr Koleen Nimrod. - 03/02/2021 seen by [facility]  02/16/2021 ER visit, CXR possible pneumonia. Started on steroids and abx  -typically has chest pain in setting of his lung disease exacerbations 02/2021 CXR patchy bibasilar airspace opacities, atelectasis vs pneumonia       Past Medical History:  Diagnosis Date   Asthma    Chronic renal insufficiency    COPD (chronic obstructive pulmonary disease) (HCC)    Heart failure (HCC)    Acute on chronic combined systolic and diastolic heart failure   Hypertension    Pulmonary emboli ([place]) 03/2019   Renal insufficiency    Type 2 diabetes mellitus (HCC)      Allergies  Allergen Reactions   Amoxicillin Other (See Comments)    Makes eyes turn yellow and burn   Ciprofloxacin     Pt not sure   Sulfa  Antibiotics Rash    Pt not sure of reaction     Current Outpatient Medications  Medication Sig Dispense Refill   albuterol (VENTOLIN HFA) 108 (90 Base) MCG/ACT inhaler Inhale 2 puffs into the lungs every 6 (six) hours as needed for wheezing or shortness of breath.      apixaban (ELIQUIS) 5 MG TABS tablet Take 5 mg by mouth 2 (two) times daily.     carvedilol (COREG) 6.25 MG tablet Take 1 tablet (6.25 mg total) by mouth 2 (two) times daily. 180 tablet 1   ciprofloxacin (CIPRO) 500 MG tablet Take 500 mg by mouth 2 (two) times daily.     DALIRESP 250 MCG TABS Take 1 tablet by mouth daily.     dapagliflozin propanediol (FARXIGA) 10 MG TABS tablet Take 1 tablet (10 mg total) by mouth daily before breakfast. 30 tablet 11   dexlansoprazole (DEXILANT) 60 MG capsule Take 1 capsule (60 mg total) by mouth daily. 30 capsule 5   diclofenac Sodium (VOLTAREN) 1 % GEL Apply 1 application topically 4 (four) times daily as needed (pain).     furosemide (LASIX) 20 MG tablet Take 20 mg by mouth 2 (two) times daily.     HYDROcodone-acetaminophen (NORCO) 7.5-325 MG tablet Take 1 tablet by mouth 2 (two)  times daily as needed.     ipratropium-albuterol (DUONEB) 0.5-2.5 (3) MG/3ML SOLN Take 3 mLs by nebulization every 6 (six) hours as needed (for shortness of breath/wheezing).      metFORMIN (GLUCOPHAGE-XR) 500 MG 24 hr tablet Take 1,000 mg by mouth 2 (two) times daily.     mirtazapine (REMERON) 15 MG tablet Take 15 mg by mouth at bedtime.     OXYGEN Inhale 2 L into the lungs at bedtime as needed (for shortness of breath).     sacubitril-valsartan (ENTRESTO) 24-26 MG Take 1 tablet by mouth 2 (two) times daily.     SM SENNA LAXATIVE 8.6 MG tablet Take 2 tablets by mouth 2 (two) times daily.     tamsulosin (FLOMAX) 0.4 MG CAPS capsule Take 1 capsule (0.4 mg total) by mouth daily. (Patient taking differently: Take 0.8 mg by mouth 2 (two) times daily.) 30 capsule 0   No current facility-administered medications for this  visit.     Past Surgical History:  Procedure Laterality Date   APPENDECTOMY     BALLOON DILATION N/A 03/18/2020   Procedure: BALLOON DILATION;  Surgeon: Eloise Harman, DO;  Location: [facility];  Service: Endoscopy;  Laterality: N/A;   CHOLECYSTECTOMY     COLONOSCOPY  10/2006   Dr.Anwar:normal   ESOPHAGOGASTRODUODENOSCOPY (EGD) WITH PROPOFOL N/A 03/18/2020   Procedure: ESOPHAGOGASTRODUODENOSCOPY (EGD) WITH PROPOFOL;  Surgeon: Eloise Harman, DO;  Location: [facility];  Service: Endoscopy;  Laterality: N/A;  2:30pm   NASAL SINUS SURGERY     NEPHRECTOMY     RIGHT (Question CA.  No further therapy needed.)   RIGHT/LEFT HEART CATH AND CORONARY ANGIOGRAPHY N/A 09/03/2019   Procedure: RIGHT/LEFT HEART CATH AND CORONARY ANGIOGRAPHY;  Surgeon: Lorretta Harp, MD;  Location: [place] [facility];  Service: Cardiovascular;  Laterality: N/A;     Allergies  Allergen Reactions   Amoxicillin Other (See Comments)    Makes eyes turn yellow and burn   Ciprofloxacin     Pt not sure   Sulfa Antibiotics Rash    Pt not sure of reaction      Family History  Problem Relation Age of Onset   Lung cancer Brother    Stroke Mother    Breast cancer Mother    Colon cancer Neg Hx      Social History Gregory Mitchell reports that he quit smoking about 44 years ago. His smoking use included cigarettes. He started smoking about 63 years ago. He has a 15.00 pack-year smoking history. He quit smokeless tobacco use about 15 years ago.  His smokeless tobacco use included chew. Gregory Mitchell reports previous alcohol use.   Review of Systems CONSTITUTIONAL: No weight loss, fever, chills, weakness or fatigue.  HEENT: Eyes: No visual loss, blurred vision, double vision or yellow sclerae.No hearing loss, sneezing, congestion, runny nose or sore throat.  SKIN: No rash or itching.  CARDIOVASCULAR: per hpi RESPIRATORY: per hpi GASTROINTESTINAL: No anorexia, nausea, vomiting or diarrhea. No abdominal  pain or blood.  GENITOURINARY: No burning on urination, no polyuria NEUROLOGICAL: No headache, dizziness, syncope, paralysis, ataxia, numbness or tingling in the extremities. No change in bowel or bladder control.  MUSCULOSKELETAL: No muscle, back pain, joint pain or stiffness.  LYMPHATICS: No enlarged nodes. No history of splenectomy.  PSYCHIATRIC: No history of depression or anxiety.  ENDOCRINOLOGIC: No reports of sweating, cold or heat intolerance. No polyuria or polydipsia.  [name]   Physical Examination Today's Vitals   03/10/21 1537  BP: (!) 122/50  Pulse: 73  SpO2: 92%  Weight: 153 lb 3.2 oz (69.5 kg)  Height: '5\' 6"'$  (1.676 m)   Body mass index is 24.73 kg/m.  Gen: resting comfortably, no acute distress HEENT: no scleral icterus, pupils equal round and reactive, no palptable cervical adenopathy,  CV: RRR, no m/r/g. Elevated JVD Resp: coarse bilaterally GI: abdomen is soft, non-tender, non-distended, normal bowel sounds, no hepatosplenomegaly MSK: extremities are warm, 2+ bialteral LE edema Skin: warm, no rash Neuro:  no focal deficits Psych: appropriate affect   Diagnostic Studies  04/2013 echo Study Conclusions  - Left ventricle: The cavity size was mildly dilated. Wall   thickness was increased in a pattern of mild to   moderateLVH. Systolic function was mildly reduced. The   estimated ejection fraction was in the range of 45% to   50%. There is mild global hypokinesis. There was an   increased relative contribution of atrial contraction to   ventricular filling. Doppler parameters are consistent   with abnormal left ventricular relaxation (grade 1   diastolic dysfunction). Doppler parameters are consistent   with high ventricular filling pressure. - Mitral valve: Calcified annulus. Mildly thickened leaflets   . Trivial to mild regurgitation. - Left atrium: The atrium was mildly dilated. - Right ventricle: The cavity size was mildly dilated. Wall   thickness was  normal. Systolic function was mildly   reduced. - Right atrium: The atrium was mildly dilated. - Pericardium, extracardiac: A trivial pericardial effusion   was identified.     11/2011 Cath Procedural Findings:               Hemodynamics:                                       AO 157/77                                     LV 154/22              Coronary angiography:   Coronary dominance: Right   Left mainstem:   Normal   Left anterior descending (LAD):  Large vessel wrapping the apex.   Mild lumina irregularities.  D1 small normal.  D2 small normal.   Left circumflex (LCx):  AV group luminal irregularities.  OM1 large and normal.  PL x 2 small normal   Right coronary artery (RCA):  Dominant.  Long mid 25%.  PDA normal.  PL moderate and normal.      Left ventriculography:  Left ventricle not injected   Final Conclusions:  Mild coronary plaque.  Non ischemic cardiomyopathy   Recommendations:   Medical management.          12/2014 Exercise Cardiolite IMPRESSION: 1. Moderate size region of inferior wall myocardial scar. No ischemic territories seen. Overlying soft tissue attenuation artifact cannot entirely be ruled out.   2. Mild inferior wall hypokinesis.   3. Left ventricular ejection fraction 48%   4.  Low to intermediate-risk stress test findings*.     05/2016 echo Study Conclusions   - Left ventricle: The cavity size was normal. Wall thickness was   increased in a pattern of mild LVH. Systolic function was normal.   The estimated ejection fraction was in the range of 55% to 60%.   Wall motion was  normal; there were no regional wall motion   abnormalities. Doppler parameters are consistent with abnormal   left ventricular relaxation (grade 1 diastolic dysfunction). - Aortic valve: Mildly calcified annulus. Trileaflet; mildly   thickened leaflets. Valve area (VTI): 4.03 cm^2. Valve area   (Vmax): 4.25 cm^2. Valve area (Vmean): 4.49 cm^2. - Left atrium: The  atrium was mildly to moderately dilated. - Atrial septum: No defect or patent foramen ovale was identified. - Technically adequate study.     Jan 2021 echo  IMPRESSIONS     1. Left ventricular ejection fraction, by visual estimation, is 25 to  30%. The left ventricle has severely decreased function. There is no left  ventricular hypertrophy.   2. Elevated left ventricular end-diastolic pressure.   3. Left ventricular diastolic parameters are consistent with Grade II  diastolic dysfunction (pseudonormalization).   4. Mild to moderately dilated left ventricular internal cavity size.   5. The left ventricle demonstrates global hypokinesis.   6. Global right ventricle has normal systolic function.The right  ventricular size is normal. Mildly increased right ventricular wall  thickness.   7. Left atrial size was severely dilated.   8. Right atrial size was moderately dilated.   9. The mitral valve is grossly normal. Mild mitral valve regurgitation.  10. The tricuspid valve is grossly normal.  11. The tricuspid valve is grossly normal. Tricuspid valve regurgitation  is mild.  12. The aortic valve is tricuspid. Aortic valve regurgitation is not  visualized. No evidence of aortic valve sclerosis or stenosis.  13. The pulmonic valve was grossly normal. Pulmonic valve regurgitation is  not visualized.  14. The inferior vena cava is normal in size with greater than 50%  respiratory variability, suggesting right atrial pressure of 3 mmHg.  Jan 2021 cath History obtained from chart review.Gregory Mitchell is a 80 y.o. male with a hx of longstanding chest pain with nonobstructive coronary artery disease by cardiac catheterization in 0000000, chronic systolic heart failure with evidence of improved LV function in the past (secondary to nonischemic cardiomyopathy), COPD, hypertension, hyperlipidemia, prior pulmonary embolism, chronic kidney disease and diabetes who is being seen today for the evaluation  of decompensated heart failure in the setting of reduced LV function and elevated troponin at the request of Dr. Dyann Kief.     IMPRESSION: Gregory Mitchell has normal coronary arteries and low filling pressures suggesting that he has been adequately or over diuresed.  He did have a high V waves suggesting that he potentially has significant mitral regurgitation.  His systolic pressures were in the mid to high 90s and I therefore gave him a 250 cc bolus of saline given his low LVEDP and wedge.  He will need guideline directed optimal medical therapy for his LV dysfunction.  The sheath removed and a TR band was placed on the right wrist to achieve patent hemostasis.  The patient left lab in stable condition.  His renal function be carefully monitored.  Dr. Davina Poke was notified of these results.   09/2020 echo IMPRESSIONS     1. Left ventricular ejection fraction, by estimation, is 20 to 25%. The  left ventricle has severely decreased function. The left ventricle  demonstrates global hypokinesis. The left ventricular internal cavity size  was severely dilated. Left ventricular  diastolic parameters are consistent with Grade III diastolic dysfunction  (restrictive).   2. Right ventricular systolic function is normal. The right ventricular  size is normal. There is normal pulmonary artery systolic pressure.   3. Left  atrial size was mildly dilated.   4. The mitral valve is normal in structure. Trivial mitral valve  regurgitation. No evidence of mitral stenosis.   5. The aortic valve is tricuspid. Aortic valve regurgitation is not  visualized. No aortic stenosis is present.   6. The inferior vena cava is normal in size with greater than 50%  respiratory variability, suggesting right atrial pressure of 3 mmHg.   02/2021 echo IMPRESSIONS     1. Diffuse hypokinesis with abnormal septal motion . Left ventricular  ejection fraction, by estimation, is 30 to 35%. The left ventricle has  moderately  decreased function. The left ventricle demonstrates global  hypokinesis. The left ventricular internal   cavity size was moderately to severely dilated. Left ventricular  diastolic parameters were normal. The average left ventricular global  longitudinal strain is -13.3 %. The global longitudinal strain is  abnormal.   2. Right ventricular systolic function is mildly reduced. The right  ventricular size is normal.   3. Right atrial size was mildly dilated.   4. The mitral valve is normal in structure. Trivial mitral valve  regurgitation. No evidence of mitral stenosis.   5. The aortic valve is normal in structure. Aortic valve regurgitation is  not visualized. No aortic stenosis is present.   6. The inferior vena cava is normal in size with greater than 50%  respiratory variability, suggesting right atrial pressure of 3 mmHg.       Assessment and Plan   Acute on chronic systolic HF - significant 2+ edema today, elevated JVD - weight trends are difficult to interpret. Significant weight loss previously about 25 pounds due to failure to thrive. He is back closer to his previous baseline 145-150, hard to know how much is fluid related and how much is due to better nutrition - will d/c lasix, start torsemide '20mg'$  bid. He will call with weights and udpate on swelling on Friday. Will need bmet/mg once he starts diuresing, could add a bnp       Gregory Mitchell, M.D.

## 2021-04-02 NOTE — Progress Notes (Signed)
Cardiology Office Note  Date: 04/05/2021   ID: [name], DOB Feb 02, 1941, MRN [id number]  PCP:  Neale Burly, MD  Cardiologist:  Carlyle Dolly, MD Electrophysiologist:  None   Chief Complaint: follow up  History of Present Illness: Gregory Mitchell is a 80 y.o. male with a history of acute on chronic systolic and diastolic heart failure, COPD, HTN, DM2, renal insufficiency.   He had a recent emergency room visit to [facility] ED on 02/16/2021 with complaint of chest pain, joint swelling, edema to the lower legs for several days.  Chest x-ray was abnormal with possible pneumonia versus atelectasis.  Oral antibiotics and steroids were initiated.   Hemoglobin was 9.4 and hematocrit 32.6.  Creatinine 1.38, GFR 52.    He recently saw pulmonary at [facility] Dr. Dorris Singh on 03/02/2021, who started him on Trelegy.  They collected a sputum sample for culture for mycobacteria, fungus, culture for Gram stain, CBC with differential, CMP, IgE zone 2 respiratory profile.  Culture result was 2+ mixed gram-negative rods, yeast, not cryptococcus, exophiala.  Apparently he had been seen previously in 2006 where he was diagnosed with severe persistent asthma with fixed airway obstruction and bronchiectasis.  Also had recurrent sinusitis and acid reflux and was status post Nissen fundoplication.  He was using as needed O2 to 3 L during sleep   He had PFT's indicating moderate obstructive disease and air trapping per provider note at [facility].  Recent CT of abdomen and pelvis on 12/26/2020 at [place] showed interval worsening of bibasilar findings likely representing infection/inflammation, pulmonary nodules measure up to 7 mm.  Noncontrast CT it 3 to 6 months was recommended.  If nodules were stable at time of repeat CT then future CT at 18 to 24 months considered optional for low risk patient but recommended for high risk patient   He was last seen by Dr. Harl Bowie on 03/10/2021.  He was having recent  increase in lower extremity edema.  Had lost a significant amount of weight this year due to failure to thrive.Gregory Mitchell  He was taking Lasix 40 mg p.o. twice daily with mild improvement.  Had a recent ER visit on 02/16/2021 for possible pneumonia on chest x-ray.  He was started on steroids and antibiotic.  On exam patient had significant 2+ edema and elevated JVD.  He had recently lost a significant amount of weight around 25 pounds due to failure to thrive.  He was back closer to his previous baseline of 145 to 155 pounds.  Plan was to DC Lasix and start torsemide 20 mg p.o. twice daily.  Plan was for patient to call with weights and update on swelling on Friday.  Plan was to order a basic metabolic panel and magnesium once he started diuresing.  Could add a BNP.  He is here for follow-up after starting torsemide at last visit with Dr. Harl Bowie.  He states he is feeling much better and has lost a fair amount of weight (12 pounds) since last visit.  At last visit he weighed 153 pounds.  Today he weighs 141 pounds.  He states the swelling in his legs has resolved.  He states his breathing is better.  He states he has an upcoming appointment on 05/04/2021 with a lung specialist at [facility] (not sure if he means [facility]) due to his ongoing chronic cough with continued yellow sputum production.   Past Medical History:  Diagnosis Date   Asthma    Chronic renal insufficiency  COPD (chronic obstructive pulmonary disease) (HCC)    Heart failure (HCC)    Acute on chronic combined systolic and diastolic heart failure   Hypertension    Pulmonary emboli ([place]) 03/2019   Renal insufficiency    Type 2 diabetes mellitus ([place])     Past Surgical History:  Procedure Laterality Date   APPENDECTOMY     BALLOON DILATION N/A 03/18/2020   Procedure: BALLOON DILATION;  Surgeon: Eloise Harman, DO;  Location: [facility];  Service: Endoscopy;  Laterality: N/A;   CHOLECYSTECTOMY     COLONOSCOPY  10/2006   Dr.Anwar:normal    ESOPHAGOGASTRODUODENOSCOPY (EGD) WITH PROPOFOL N/A 03/18/2020   Procedure: ESOPHAGOGASTRODUODENOSCOPY (EGD) WITH PROPOFOL;  Surgeon: Eloise Harman, DO;  Location: [facility];  Service: Endoscopy;  Laterality: N/A;  2:30pm   NASAL SINUS SURGERY     NEPHRECTOMY     RIGHT (Question CA.  No further therapy needed.)   RIGHT/LEFT HEART CATH AND CORONARY ANGIOGRAPHY N/A 09/03/2019   Procedure: RIGHT/LEFT HEART CATH AND CORONARY ANGIOGRAPHY;  Surgeon: Lorretta Harp, MD;  Location: [place] CV LAB;  Service: Cardiovascular;  Laterality: N/A;    Current Outpatient Medications  Medication Sig Dispense Refill   albuterol (VENTOLIN HFA) 108 (90 Base) MCG/ACT inhaler Inhale 2 puffs into the lungs every 6 (six) hours as needed for wheezing or shortness of breath.      apixaban (ELIQUIS) 5 MG TABS tablet Take 5 mg by mouth 2 (two) times daily.     carvedilol (COREG) 6.25 MG tablet Take 1 tablet (6.25 mg total) by mouth 2 (two) times daily. 180 tablet 1   DALIRESP 250 MCG TABS Take 1 tablet by mouth daily.     dapagliflozin propanediol (FARXIGA) 10 MG TABS tablet Take 1 tablet (10 mg total) by mouth daily before breakfast. 30 tablet 11   dexlansoprazole (DEXILANT) 60 MG capsule Take 1 capsule (60 mg total) by mouth daily. 30 capsule 5   diclofenac Sodium (VOLTAREN) 1 % GEL Apply 1 application topically 4 (four) times daily as needed (pain).     HYDROcodone-acetaminophen (NORCO) 7.5-325 MG tablet Take 1 tablet by mouth 2 (two) times daily as needed.     ipratropium-albuterol (DUONEB) 0.5-2.5 (3) MG/3ML SOLN Take 3 mLs by nebulization every 6 (six) hours as needed (for shortness of breath/wheezing).      metFORMIN (GLUCOPHAGE-XR) 500 MG 24 hr tablet Take 1,000 mg by mouth 2 (two) times daily.     mirtazapine (REMERON) 15 MG tablet Take 15 mg by mouth at bedtime.     OXYGEN Inhale 2 L into the lungs at bedtime as needed (for shortness of breath).     sacubitril-valsartan (ENTRESTO) 24-26 MG Take 1  tablet by mouth 2 (two) times daily.     SM SENNA LAXATIVE 8.6 MG tablet Take 2 tablets by mouth 2 (two) times daily.     tamsulosin (FLOMAX) 0.4 MG CAPS capsule Take 0.8 mg by mouth 2 (two) times daily.     torsemide (DEMADEX) 20 MG tablet Take 1 tablet (20 mg total) by mouth 2 (two) times daily. 60 tablet 6   No current facility-administered medications for this visit.   Allergies:  Amoxicillin, Ciprofloxacin, and Sulfa antibiotics   Social History: The patient  reports that he quit smoking about 44 years ago. His smoking use included cigarettes. He started smoking about 63 years ago. He has a 15.00 pack-year smoking history. He quit smokeless tobacco use about 15 years ago.  His  smokeless tobacco use included chew. He reports that he does not currently use alcohol. He reports that he does not use drugs.   Family History: The patient's family history includes Breast cancer in his mother; Lung cancer in his brother; Stroke in his mother.   ROS:  Please see the history of present illness. Otherwise, complete review of systems is positive for none.  All other systems are reviewed and negative.   Physical Exam: VS:  BP 118/62   Pulse 79   Ht '5\' 6"'$  (1.676 m)   Wt 141 lb 3.2 oz (64 kg)   SpO2 90%   BMI 22.79 kg/m , BMI Body mass index is 22.79 kg/m.  Wt Readings from Last 3 Encounters:  04/03/21 141 lb 3.2 oz (64 kg)  03/10/21 153 lb 3.2 oz (69.5 kg)  12/30/20 128 lb 3.2 oz (58.2 kg)    General: Patient appears comfortable at rest. Neck: Supple, no elevated JVP or carotid bruits, no thyromegaly. Lungs: Clear to auscultation, nonlabored breathing at rest. Cardiac: Regular rate and rhythm, no S3 or significant systolic murmur, no pericardial rub. Extremities: No pitting edema, distal pulses 2+. Skin: Warm and dry. Musculoskeletal: No kyphosis. Neuropsychiatric: Alert and oriented x3, affect grossly appropriate.  ECG:    Recent Labwork: 10/04/2020: B Natriuretic Peptide  1,309.0 10/14/2020: Magnesium 2.4 10/16/2020: ALT 66; AST 57 11/15/2020: BUN 16; Creatinine, Ser 1.29; Hemoglobin 10.3; Platelets 348; Potassium 3.9; Sodium 143  No results found for: CHOL, TRIG, HDL, CHOLHDL, VLDL, LDLCALC, LDLDIRECT  Other Studies Reviewed Today:  CT abdomen pelvis 12/26/2020 [facility] EXAM:  CT ABDOMEN AND PELVIS WITHOUT CONTRAST   TECHNIQUE:  Multidetector CT imaging of the abdomen and pelvis was performed  following the standard protocol without IV contrast.   COMPARISON:  CT abdomen pelvis 06/12/2020   FINDINGS:  Lower chest: Interval worsening of bibasilar bronchial wall  thickening with tree-in-bud nodularity. Multiple scattered nodules  also noted with the largest at the left base measuring 7 mm  pulmonary nodule. Cardiomegaly. Findings suggestive of anemia.   Hepatobiliary: No focal liver abnormality is seen. Status post  cholecystectomy. No biliary dilatation.   Pancreas: No focal lesion. Normal pancreatic contour. No surrounding  inflammatory changes. No main pancreatic ductal dilatation.  Persistent prominence of the main pancreatic duct.   Spleen: Normal in size without focal abnormality.   Adrenals/Urinary Tract:   The right adrenal gland is not well visualized and may be surgically  absent. The left adrenal gland demonstrates no nodularity. Status  post right nephrectomy. No nephrolithiasis, no hydronephrosis, and  no contour-deforming renal mass. No ureterolithiasis or hydroureter.   Irregular urinary bladder wall thickening along the left anterior  portion which previously was noted to be involved in a left inguinal  hernia. Otherwise urinary bladder is grossly unremarkable.   Stomach/Bowel: Stomach is within normal limits. No evidence of bowel  wall thickening or dilatation. Scattered colonic diverticulosis.  Question rectal wall thickening. Appendix appears normal.   Vascular/Lymphatic: No abdominal aorta or iliac aneurysm. Moderate   to severe atherosclerotic plaque of the aorta and its branches. No  abdominal, pelvic, or inguinal lymphadenopathy.   Reproductive: The prostate is enlarged measuring up to 42 cm with  evidence of mass effect on the posterior wall of the urinary bladder  (2:53).   Other: No intraperitoneal free fluid. No intraperitoneal free gas.  No organized fluid collection.   Musculoskeletal:   No abdominal wall hernia or abnormality.   No suspicious lytic or blastic osseous  lesions. No acute displaced  fracture. Multilevel degenerative changes of the spine. Procedure Note  Bluford Kaufmann, MD - 12/26/2020  Formatting of this note might be different from the original.  CLINICAL DATA:  Abdominal pain X 2 days.  Chest pain.   EXAM:  CT ABDOMEN AND PELVIS WITHOUT CONTRAST   TECHNIQUE:  Multidetector CT imaging of the abdomen and pelvis was performed  following the standard protocol without IV contrast.   COMPARISON:  CT abdomen pelvis 06/12/2020   FINDINGS:  Lower chest: Interval worsening of bibasilar bronchial wall  thickening with tree-in-bud nodularity. Multiple scattered nodules  also noted with the largest at the left base measuring 7 mm  pulmonary nodule. Cardiomegaly. Findings suggestive of anemia.   Hepatobiliary: No focal liver abnormality is seen. Status post  cholecystectomy. No biliary dilatation.   Pancreas: No focal lesion. Normal pancreatic contour. No surrounding  inflammatory changes. No main pancreatic ductal dilatation.  Persistent prominence of the main pancreatic duct.   Spleen: Normal in size without focal abnormality.   Adrenals/Urinary Tract:   The right adrenal gland is not well visualized and may be surgically  absent. The left adrenal gland demonstrates no nodularity. Status  post right nephrectomy. No nephrolithiasis, no hydronephrosis, and  no contour-deforming renal mass. No ureterolithiasis or hydroureter.   Irregular urinary bladder  wall thickening along the left anterior  portion which previously was noted to be involved in a left inguinal  hernia. Otherwise urinary bladder is grossly unremarkable.   Stomach/Bowel: Stomach is within normal limits. No evidence of bowel  wall thickening or dilatation. Scattered colonic diverticulosis.  Question rectal wall thickening. Appendix appears normal.   Vascular/Lymphatic: No abdominal aorta or iliac aneurysm. Moderate  to severe atherosclerotic plaque of the aorta and its branches. No  abdominal, pelvic, or inguinal lymphadenopathy.   Reproductive: The prostate is enlarged measuring up to 42 cm with  evidence of mass effect on the posterior wall of the urinary bladder  (2:53).   Other: No intraperitoneal free fluid. No intraperitoneal free gas.  No organized fluid collection.   Musculoskeletal:   No abdominal wall hernia or abnormality.   No suspicious lytic or blastic osseous lesions. No acute displaced  fracture. Multilevel degenerative changes of the spine.   IMPRESSION:  1. Interval worsening of bibasilar findings likely represent  infection/inflammation. Pulmonary nodules measure up to 7 mm.  Non-contrast chest CT at 3-6 months is recommended. If the nodules  are stable at time of repeat CT, then future CT at 18-24 months  (from today's scan) is considered optional for low-risk patients,  but is recommended for high-risk patients. This recommendation  follows the consensus statement: Guidelines for Management of  Incidental Pulmonary Nodules Detected on CT Images: From the  Fleischner Society 2017; Radiology 2017; 284:228-243.  2. Irregular urinary bladder wall thickening along the left anterior  portion which previously was noted to be involved in a left inguinal  hernia. Finding could be related to inflammation versus underlying  lesion.  3. Question rectal wall thickening. No associated perirectal fat  stranding to suggest definite inflammation. No  definite  lymphadenopathy on this noncontrast study. Recommend correlation  with nonemergent colonoscopy.  4. Scattered colonic diverticulosis with no acute diverticulitis.  5. Status post right nephrectomy with limited evaluation on this  noncontrast study.  6.  Aortic Atherosclerosis (ICD10-I70.0).  7. Cardiomegaly.    04/2013 echo Study Conclusions  - Left ventricle: The cavity size was mildly dilated. Wall   thickness was  increased in a pattern of mild to   [facility]. Systolic function was mildly reduced. The   estimated ejection fraction was in the range of 45% to   50%. There is mild global hypokinesis. There was an   increased relative contribution of atrial contraction to   ventricular filling. Doppler parameters are consistent   with abnormal left ventricular relaxation (grade 1   diastolic dysfunction). Doppler parameters are consistent   with high ventricular filling pressure. - Mitral valve: Calcified annulus. Mildly thickened leaflets   . Trivial to mild regurgitation. - Left atrium: The atrium was mildly dilated. - Right ventricle: The cavity size was mildly dilated. Wall   thickness was normal. Systolic function was mildly   reduced. - Right atrium: The atrium was mildly dilated. - Pericardium, extracardiac: A trivial pericardial effusion   was identified.     11/2011 Cath Procedural Findings:               Hemodynamics:                                       AO 157/77                                     LV 154/22              Coronary angiography:   Coronary dominance: Right   Left mainstem:   Normal   Left anterior descending (LAD):  Large vessel wrapping the apex.   Mild lumina irregularities.  D1 small normal.  D2 small normal.   Left circumflex (LCx):  AV group luminal irregularities.  OM1 large and normal.  PL x 2 small normal   Right coronary artery (RCA):  Dominant.  Long mid 25%.  PDA normal.  PL moderate and normal.      Left  ventriculography:  Left ventricle not injected   Final Conclusions:  Mild coronary plaque.  Non ischemic cardiomyopathy   Recommendations:   Medical management.          12/2014 Exercise Cardiolite IMPRESSION: 1. Moderate size region of inferior wall myocardial scar. No ischemic territories seen. Overlying soft tissue attenuation artifact cannot entirely be ruled out.   2. Mild inferior wall hypokinesis.   3. Left ventricular ejection fraction 48%   4.  Low to intermediate-risk stress test findings*.     05/2016 echo Study Conclusions   - Left ventricle: The cavity size was normal. Wall thickness was   increased in a pattern of mild LVH. Systolic function was normal.   The estimated ejection fraction was in the range of 55% to 60%.   Wall motion was normal; there were no regional wall motion   abnormalities. Doppler parameters are consistent with abnormal   left ventricular relaxation (grade 1 diastolic dysfunction). - Aortic valve: Mildly calcified annulus. Trileaflet; mildly   thickened leaflets. Valve area (VTI): 4.03 cm^2. Valve area   (Vmax): 4.25 cm^2. Valve area (Vmean): 4.49 cm^2. - Left atrium: The atrium was mildly to moderately dilated. - Atrial septum: No defect or patent foramen ovale was identified. - Technically adequate study.     Jan 2021 echo   IMPRESSIONS     1. Left ventricular ejection fraction, by visual estimation, is 25 to  30%. The left ventricle has severely decreased function. There is no  left  ventricular hypertrophy.   2. Elevated left ventricular end-diastolic pressure.   3. Left ventricular diastolic parameters are consistent with Grade II  diastolic dysfunction (pseudonormalization).   4. Mild to moderately dilated left ventricular internal cavity size.   5. The left ventricle demonstrates global hypokinesis.   6. Global right ventricle has normal systolic function.The right  ventricular size is normal. Mildly increased right  ventricular wall  thickness.   7. Left atrial size was severely dilated.   8. Right atrial size was moderately dilated.   9. The mitral valve is grossly normal. Mild mitral valve regurgitation.  10. The tricuspid valve is grossly normal.  11. The tricuspid valve is grossly normal. Tricuspid valve regurgitation  is mild.  12. The aortic valve is tricuspid. Aortic valve regurgitation is not  visualized. No evidence of aortic valve sclerosis or stenosis.  13. The pulmonic valve was grossly normal. Pulmonic valve regurgitation is  not visualized.  14. The inferior vena cava is normal in size with greater than 50%  respiratory variability, suggesting right atrial pressure of 3 mmHg.  Jan 2021 cath History obtained from chart review.MAXXON KUKURA is a 81 y.o. male with a hx of longstanding chest pain with nonobstructive coronary artery disease by cardiac catheterization in 0000000, chronic systolic heart failure with evidence of improved LV function in the past (secondary to nonischemic cardiomyopathy), COPD, hypertension, hyperlipidemia, prior pulmonary embolism, chronic kidney disease and diabetes who is being seen today for the evaluation of decompensated heart failure in the setting of reduced LV function and elevated troponin at the request of Dr. Dyann Kief.     IMPRESSION: Mr. Seman has normal coronary arteries and low filling pressures suggesting that he has been adequately or over diuresed.  He did have a high V waves suggesting that he potentially has significant mitral regurgitation.  His systolic pressures were in the mid to high 90s and I therefore gave him a 250 cc bolus of saline given his low LVEDP and wedge.  He will need guideline directed optimal medical therapy for his LV dysfunction.  The sheath removed and a TR band was placed on the right wrist to achieve patent hemostasis.  The patient left lab in stable condition.  His renal function be carefully monitored.  Dr. Davina Poke was notified  of these results.     09/2020 echo IMPRESSIONS     1. Left ventricular ejection fraction, by estimation, is 20 to 25%. The  left ventricle has severely decreased function. The left ventricle  demonstrates global hypokinesis. The left ventricular internal cavity size  was severely dilated. Left ventricular  diastolic parameters are consistent with Grade III diastolic dysfunction  (restrictive).   2. Right ventricular systolic function is normal. The right ventricular  size is normal. There is normal pulmonary artery systolic pressure.   3. Left atrial size was mildly dilated.   4. The mitral valve is normal in structure. Trivial mitral valve  regurgitation. No evidence of mitral stenosis.   5. The aortic valve is tricuspid. Aortic valve regurgitation is not  visualized. No aortic stenosis is present.   6. The inferior vena cava is normal in size with greater than 50%  respiratory variability, suggesting right atrial pressure of 3 mmHg.    02/2021 echo IMPRESSIONS     1. Diffuse hypokinesis with abnormal septal motion . Left ventricular  ejection fraction, by estimation, is 30 to 35%. The left ventricle has  moderately decreased function. The left ventricle demonstrates global  hypokinesis. The left ventricular internal   cavity size was moderately to severely dilated. Left ventricular  diastolic parameters were normal. The average left ventricular global  longitudinal strain is -13.3 %. The global longitudinal strain is  abnormal.   2. Right ventricular systolic function is mildly reduced. The right  ventricular size is normal.   3. Right atrial size was mildly dilated.   4. The mitral valve is normal in structure. Trivial mitral valve  regurgitation. No evidence of mitral stenosis.   5. The aortic valve is normal in structure. Aortic valve regurgitation is  not visualized. No aortic stenosis is present.   6. The inferior vena cava is normal in size with greater than 50%   respiratory variability, suggesting right atrial pressure of 3 mmHg.           Assessment and Plan:  1. Acute on chronic systolic heart failure ([place])   2. Severe persistent asthma, unspecified whether complicated     1. Acute on chronic systolic heart failure ([place]) He was recently switched to torsemide from Lasix at last visit with Dr. Harl Bowie.  He states he is feeling much better and has lost a significant amount of weight since last visit and his lower extremity edema has resolved.  His weight decreased from 153 pounds at last visit to 141 pounds today.  He states he is compliant with his medications.  Continue torsemide 20 mg p.o. twice daily.  Continue Entresto 24/26 mg p.o. twice daily.  Continue carvedilol 6.25 mg p.o. twice daily.  Continue Farxiga 10 mg daily.  Please get a follow-up BMP, magnesium, and BNP    3. Severe persistent asthma/COPD/bronchiectasis unspecified whether complicated As noted above he has had a previous diagnosis of severe persistent asthma/bronchiectasis, fixed airway obstruction, per [facility] notes back in 2006.  He saw Dr. Dorris Singh pulmonologist on 03/02/2021 at [facility].  He collected a sputum sample for Mycobacterium, fungus, culture for Gram stain, IgE zone 2 respiratory profile.  Culture showed 2+ mixed gram-negative rods, yeast (not cryptococcus), exophiala.  He was started on Trelegy.  Recent PFTs indicated moderate obstructive disease and air trapping per provider note at [facility].  A CT scan of abdomen pelvis on 12/26/2020 Demonstrated interval worsening of bibasilar findings likely representing infection/inflammation.  Pulmonary nodules measuring up to 7 mm.  Noncontrast CT at 3 to 6 months was recommended. He states he is seeing a lung specialist at [facility] on 05/04/2021. I think he may be referring to [facility] specialist he just saw on March 02, 2021    Medication Adjustments/Labs and Tests Ordered: Current medicines are reviewed at length with the patient today.   Concerns regarding medicines are outlined above.   Disposition: Follow-up with Dr. Harl Bowie or APP 3 months  Signed, Levell July, NP 04/05/2021 3:36 PM    [facility], [address], [place] [address] Phone: [phone]; Fax: [phone]

## 2021-04-03 ENCOUNTER — Other Ambulatory Visit: Payer: Self-pay

## 2021-04-03 ENCOUNTER — Encounter: Payer: Self-pay | Admitting: Family Medicine

## 2021-04-03 ENCOUNTER — Ambulatory Visit (INDEPENDENT_AMBULATORY_CARE_PROVIDER_SITE_OTHER): Payer: Medicare HMO | Admitting: Family Medicine

## 2021-04-03 VITALS — BP 118/62 | HR 79 | Ht 66.0 in | Wt 141.2 lb

## 2021-04-03 DIAGNOSIS — J4550 Severe persistent asthma, uncomplicated: Secondary | ICD-10-CM

## 2021-04-03 DIAGNOSIS — I5023 Acute on chronic systolic (congestive) heart failure: Secondary | ICD-10-CM

## 2021-04-03 NOTE — Patient Instructions (Addendum)
Medication Instructions:  Continue all current medications.   Labwork: BMET, Mg, BNP - orders given today.  Please do in 2 weeks (around 04/17/21). Office will contact with results via phone or letter.    Testing/Procedures: none  Follow-Up: 3 months   Any Other Special Instructions Will Be Listed Below (If Applicable).    If you need a refill on your cardiac medications before your next appointment, please call your pharmacy.

## 2021-04-05 ENCOUNTER — Encounter: Payer: Self-pay | Admitting: Family Medicine

## 2021-04-23 ENCOUNTER — Ambulatory Visit: Payer: Medicare HMO | Admitting: Internal Medicine

## 2021-04-29 ENCOUNTER — Telehealth: Payer: Self-pay | Admitting: Internal Medicine

## 2021-04-29 ENCOUNTER — Encounter: Payer: Self-pay | Admitting: Internal Medicine

## 2021-04-29 ENCOUNTER — Ambulatory Visit (INDEPENDENT_AMBULATORY_CARE_PROVIDER_SITE_OTHER): Payer: Medicare HMO | Admitting: Internal Medicine

## 2021-04-29 ENCOUNTER — Other Ambulatory Visit: Payer: Self-pay

## 2021-04-29 VITALS — BP 128/70 | HR 78 | Temp 97.7°F | Ht 66.0 in | Wt 145.0 lb

## 2021-04-29 DIAGNOSIS — B3781 Candidal esophagitis: Secondary | ICD-10-CM

## 2021-04-29 DIAGNOSIS — K219 Gastro-esophageal reflux disease without esophagitis: Secondary | ICD-10-CM | POA: Diagnosis not present

## 2021-04-29 DIAGNOSIS — R933 Abnormal findings on diagnostic imaging of other parts of digestive tract: Secondary | ICD-10-CM | POA: Diagnosis not present

## 2021-04-29 DIAGNOSIS — R1319 Other dysphagia: Secondary | ICD-10-CM | POA: Diagnosis not present

## 2021-04-29 MED ORDER — PANTOPRAZOLE SODIUM 40 MG PO TBEC: 40.0000 mg | DELAYED_RELEASE_TABLET | Freq: Two times a day (BID) | ORAL | 3 refills | Status: DC

## 2021-04-29 NOTE — Patient Instructions (Signed)
Given your ongoing difficulties with swallowing, we will schedule you for upper endoscopy to further evaluate.  I may stretch your esophagus depending on findings.  At the same time we will perform flexible sigmoidoscopy to evaluate rectal thickening on CT scan.  You will need to hold your Eliquis for 48 hours prior to procedures.  We will reach out to your cardiologist in regards to this.  For your constipation, I would recommend adding over-the-counter MiraLAX (polyethylene glycol) 1 capful daily in the morning.  If this is not adequate you can go to 2 capfuls daily.  I will discontinue your Dexilant and switch you back to pantoprazole 40 mg twice daily.  Prescription sent to [facility].  It was nice seeing both you again today.  Dr. Abbey Chatters  At [facility] we value your feedback. You may receive a survey about your visit today. Please share your experience as we strive to create trusting relationships with our patients to provide genuine, compassionate, quality care.  We appreciate your understanding and patience as we review any laboratory studies, imaging, and other diagnostic tests that are ordered as we care for you. Our office policy is 5 business days for review of these results, and any emergent or urgent results are addressed in a timely manner for your best interest. If you do not hear from our office in 1 week, please contact us.   We also encourage the use of MyChart, which contains your medical information for your review as well. If you are not enrolled in this feature, an access code is on this after visit summary for your convenience. Thank you for allowing [place] to be involved in your care.  It was great to see you today!  I hope you have a great rest of your summer!!    Gregory Mitchell. Abbey Chatters, D.O. Gastroenterology and Hepatology [facility]

## 2021-04-29 NOTE — Telephone Encounter (Signed)
Attention: Preop   We would like obtain cardiac clearance for this patient please.  Procedure: EGD DILATION FLEX SIG  Date: TBD  Medication to hold: ELIQUIS X 56 HRS  Surgeon: DR. Abbey Chatters  Phone: [phone]  Fax:  [phone]  Type of Anesthesia: PROPOFOL

## 2021-04-29 NOTE — Telephone Encounter (Signed)
Covering preop today. Will route to pharm then patient will need call.

## 2021-04-30 ENCOUNTER — Telehealth: Payer: Self-pay

## 2021-04-30 ENCOUNTER — Telehealth: Payer: Self-pay | Admitting: Internal Medicine

## 2021-04-30 MED ORDER — ESOMEPRAZOLE MAGNESIUM 40 MG PO CPDR: 40.0000 mg | DELAYED_RELEASE_CAPSULE | Freq: Two times a day (BID) | ORAL | 3 refills | Status: DC

## 2021-04-30 NOTE — Telephone Encounter (Signed)
Patient with diagnosis of PE on Eliquis for anticoagulation. Unclear when PE occurred, but it was prior to 08/2019 hospitalization (mentioned that Eliquis was held and then resumed on discharge). Eliquis has been prescribed by PCP. Would recommend that clearance come from managing physician.

## 2021-04-30 NOTE — Telephone Encounter (Signed)
Left voice mail to call back 

## 2021-04-30 NOTE — Telephone Encounter (Signed)
[phone]  PLEASE CALL PATIENT, HE HAS A MEDICATION QUESTION.

## 2021-04-30 NOTE — Telephone Encounter (Signed)
See other phone note

## 2021-04-30 NOTE — Telephone Encounter (Signed)
Prescription for esomeprazole sent to patient's pharmacy

## 2021-04-30 NOTE — Progress Notes (Addendum)
Referring Provider: Neale Burly, MD Primary Care Physician:  Neale Burly, MD Primary GI:  Dr. Abbey Chatters  Chief Complaint  Patient presents with   Dysphagia   Gastroesophageal Reflux    Protonix helped better than Dexilant    HPI:   Gregory Mitchell is a 80 y.o. male who presents to the clinic today for follow-up visit.  Underwent EGD in August 2021 for odynophagia, chest pain, dysphagia and was found to have severe candidal esophagitis.  He has received 3 extended courses of Diflucan.  His symptoms resolved at the conclusion of these courses of this and return thereafter.  He again states his symptoms are back.  Notes dysphagia, chest pain.  Also reports acid taste in his mouth constantly.  Previously trialed on Nexium which did not help.  Previously on pantoprazole.  Was switched to [place] after previous visit and states the pantoprazole actually help better and wishes to go back on this.  Today he states he continues to have issues with swallowing.  Notes regurgitation of foods as well.  He had a CT abdomen pelvis on 12/26/2020 which showed rectal wall thickening.  Patient does note chronic constipation.  Past Medical History:  Diagnosis Date   Asthma    Chronic renal insufficiency    COPD (chronic obstructive pulmonary disease) (HCC)    Heart failure (HCC)    Acute on chronic combined systolic and diastolic heart failure   Hypertension    Pulmonary emboli ([place]) 03/2019   Renal insufficiency    Type 2 diabetes mellitus ([place])     Past Surgical History:  Procedure Laterality Date   APPENDECTOMY     BALLOON DILATION N/A 03/18/2020   Procedure: BALLOON DILATION;  Surgeon: Eloise Harman, DO;  [facility];  Service: Endoscopy;  Laterality: N/A;   CHOLECYSTECTOMY     COLONOSCOPY  10/2006   Dr.Anwar:normal   ESOPHAGOGASTRODUODENOSCOPY (EGD) WITH PROPOFOL N/A 03/18/2020   Procedure: ESOPHAGOGASTRODUODENOSCOPY (EGD) WITH PROPOFOL;  Surgeon: Eloise Harman,  DO;  Location: [facility];  Service: Endoscopy;  Laterality: N/A;  2:30pm   NASAL SINUS SURGERY     NEPHRECTOMY     RIGHT (Question CA.  No further therapy needed.)   RIGHT/LEFT HEART CATH AND CORONARY ANGIOGRAPHY N/A 09/03/2019   Procedure: RIGHT/LEFT HEART CATH AND CORONARY ANGIOGRAPHY;  Surgeon: Lorretta Harp, MD;  Location: [place] [facility];  Service: Cardiovascular;  Laterality: N/A;    Current Outpatient Medications  Medication Sig Dispense Refill   albuterol (VENTOLIN HFA) 108 (90 Base) MCG/ACT inhaler Inhale 2 puffs into the lungs every 6 (six) hours as needed for wheezing or shortness of breath.      apixaban (ELIQUIS) 5 MG TABS tablet Take 5 mg by mouth 2 (two) times daily.     carvedilol (COREG) 6.25 MG tablet Take 1 tablet (6.25 mg total) by mouth 2 (two) times daily. 180 tablet 1   dapagliflozin propanediol (FARXIGA) 10 MG TABS tablet Take 1 tablet (10 mg total) by mouth daily before breakfast. 30 tablet 11   diclofenac Sodium (VOLTAREN) 1 % GEL Apply 1 application topically 4 (four) times daily as needed (pain).     HYDROcodone-acetaminophen (NORCO) 7.5-325 MG tablet Take 1 tablet by mouth 2 (two) times daily as needed.     ipratropium-albuterol (DUONEB) 0.5-2.5 (3) MG/3ML SOLN Take 3 mLs by nebulization every 6 (six) hours as needed (for shortness of breath/wheezing).      mirtazapine (REMERON) 15 MG tablet  Take 15 mg by mouth at bedtime.     OXYGEN Inhale 2 L into the lungs at bedtime as needed (for shortness of breath).     pantoprazole (PROTONIX) 40 MG tablet Take 1 tablet (40 mg total) by mouth 2 (two) times daily before a meal. 180 tablet 3   sacubitril-valsartan (ENTRESTO) 24-26 MG Take 1 tablet by mouth 2 (two) times daily.     SM SENNA LAXATIVE 8.6 MG tablet Take 2 tablets by mouth 2 (two) times daily.     tamsulosin (FLOMAX) 0.4 MG CAPS capsule Take 0.8 mg by mouth 2 (two) times daily.     torsemide (DEMADEX) 20 MG tablet Take 1 tablet (20 mg total) by mouth 2  (two) times daily. 60 tablet 6   DALIRESP 250 MCG TABS Take 1 tablet by mouth daily. (Patient not taking: Reported on 04/29/2021)     metFORMIN (GLUCOPHAGE-XR) 500 MG 24 hr tablet Take 1,000 mg by mouth 2 (two) times daily. (Patient not taking: Reported on 04/29/2021)     No current facility-administered medications for this visit.    Allergies as of 04/29/2021 - Review Complete 04/29/2021  Allergen Reaction Noted   Amoxicillin Other (See Comments) 11/26/2011   Ciprofloxacin  11/26/2011   Sulfa antibiotics Rash 11/26/2011    Family History  Problem Relation Age of Onset   Lung cancer Brother    Stroke Mother    Breast cancer Mother    Colon cancer Neg Hx     Social History   Socioeconomic History   Marital status: Widowed    Spouse name: Not on file   Number of children: Not on file   Years of education: Not on file   Highest education level: Not on file  Occupational History   Not on file  Tobacco Use   Smoking status: Former    Packs/day: 1.00    Years: 15.00    Pack years: 15.00    Types: Cigarettes    Start date: 07/14/1957    Quit date: 08/09/1976    Years since quitting: 44.7   Smokeless tobacco: Former    Types: Chew    Quit date: 02/10/2006   Tobacco comments:    chewed tobacco for 10 years  Vaping Use   Vaping Use: Never used  Substance and Sexual Activity   Alcohol use: Not Currently    Alcohol/week: 0.0 standard drinks    Comment: no etoh since he was in his 69s   Drug use: Never   Sexual activity: Yes  Other Topics Concern   Not on file  Social History Narrative   Lives with wife.  Three children.     Social Determinants of Health   Financial Resource Strain: Not on file  Food Insecurity: Not on file  Transportation Needs: Not on file  Physical Activity: Not on file  Stress: Not on file  Social Connections: Not on file    Subjective: Review of Systems  Constitutional:  Negative for chills and fever.  HENT:  Negative for congestion and  hearing loss.   Eyes:  Negative for blurred vision and double vision.  Respiratory:  Negative for cough and shortness of breath.   Cardiovascular:  Positive for chest pain. Negative for palpitations.  Gastrointestinal:  Positive for heartburn. Negative for abdominal pain, blood in stool, constipation, diarrhea, melena and vomiting.       Dysphagia  Genitourinary:  Negative for dysuria and urgency.  Musculoskeletal:  Negative for joint pain and myalgias.  Skin:  Negative for itching and rash.  Neurological:  Negative for dizziness and headaches.  Psychiatric/Behavioral:  Negative for depression. The patient is not nervous/anxious.     Objective: BP 128/70   Pulse 78   Temp 97.7 F (36.5 C) (Temporal)   Ht 5\' 6"  (1.676 m)   Wt 145 lb (65.8 kg)   BMI 23.40 kg/m  Physical Exam Constitutional:      Appearance: Normal appearance.  HENT:     Head: Normocephalic and atraumatic.  Eyes:     Extraocular Movements: Extraocular movements intact.     Conjunctiva/sclera: Conjunctivae normal.  Cardiovascular:     Rate and Rhythm: Normal rate and regular rhythm.  Pulmonary:     Effort: Pulmonary effort is normal.     Comments: Bilateral crackles with scattered expiratory wheezing Abdominal:     General: Bowel sounds are normal.     Palpations: Abdomen is soft.  Musculoskeletal:        General: Normal range of motion.     Cervical back: Normal range of motion and neck supple.  Skin:    General: Skin is warm.  Neurological:     General: No focal deficit present.     Mental Status: He is alert and oriented to person, place, and time.  Psychiatric:        Mood and Affect: Mood normal.        Behavior: Behavior normal.     Assessment: *Candidal esophagitis-treated x3 courses *Dysphagia *GERD *Rectal thickening on CT scan  Plan: Patient has now been treated for candidal esophagitis with 3 courses of Diflucan.  He only seems to get better at the conclusion of courses though his  symptoms do return.  Also complaining of acid taste in his mouth today. Also with anemia with most recent hemoglobin 10.3.  Given his cardiac and pulmonary issues, we have tried to avoid repeat EGD though given his ongoing symptoms even after 3 courses of Diflucan, I think it would be reasonable to repeat EGD.  If he does have a evidence of candidal esophagitis, we will send for culture.  I will change his Dexilant to pantoprazole as he states this helped his symptoms more  Rectal thickening on CT scan, possibly related to stercoral colitis in the setting of constipation.  At the same time of his EGD we will perform flexible sigmoidoscopy to further evaluate.  For his constipation I have recommended he start taking MiraLAX 1 capful daily.  If this is not adequate then he can go up to 2 capfuls daily if this still is not adequate then he can add on once daily Dulcolax.  Patient chronically on apixaban.  We will reach out to his cardiologist in regards to holding this for 48 hours prior to procedure.  04/30/2021 1:27 PM   Disclaimer: This note was dictated with voice recognition software. Similar sounding words can inadvertently be transcribed and may not be corrected upon review.

## 2021-04-30 NOTE — Telephone Encounter (Signed)
Pt phoned a few minutes ago stating the pill you gave him yesterday does not work (pantoprazole). The pt states he was on esomeprazole and it worked for him. Please send in new Rx for esomeprazole. I advised the pt that his insurance may/may not cover it since he had been on it before then not to be on it. Pt wants Rx sent to [facility].

## 2021-05-01 NOTE — Telephone Encounter (Signed)
Noted  

## 2021-05-04 ENCOUNTER — Telehealth: Payer: Self-pay

## 2021-05-04 ENCOUNTER — Encounter: Payer: Self-pay | Admitting: Internal Medicine

## 2021-05-04 NOTE — Telephone Encounter (Signed)
Do we need to obtain permission to hold Eliquis from the patient's PCP?

## 2021-05-04 NOTE — Telephone Encounter (Signed)
Pt approved for Esomeprazole magnesium cap DR from 08/09/2020-08/08/2021. Types of coverage approved: [place] limits will give to Manuela Schwartz to send to scan.

## 2021-05-05 NOTE — Telephone Encounter (Signed)
Faxed PCP and obtained ok to hold pt's Eliquis x 48 hrs as requested for upcoming EGD. Ok to proceed with scheduling. Letter granting ok will be scanned into patient's chart.

## 2021-05-05 NOTE — Telephone Encounter (Signed)
Tried to call pt, LMOVM for return call. 

## 2021-05-06 NOTE — Telephone Encounter (Signed)
Pre-op appt 05/21/21. Appt letter mailed with procedure instructions.

## 2021-05-06 NOTE — Telephone Encounter (Signed)
Spoke to pt's daughter Apolonio Schneiders), Flex Sig/EGD/Dilation w/Propofol ASA 4 w/Dr. Abbey Chatters scheduled for 05/25/21 at 9:45am. Informed daughter ok was received for pt to hold Eliquis for 48 hours before procedure. Orders entered.

## 2021-05-19 ENCOUNTER — Telehealth: Payer: Self-pay | Admitting: Gastroenterology

## 2021-05-19 NOTE — Telephone Encounter (Signed)
PATIENT WANTS TO CANCEL PROCEDURE AT THIS TIME, HE HAS PNEUMONIA

## 2021-05-19 NOTE — Telephone Encounter (Signed)
Procedure already cancelled in chart. Spoke to daughter Apolonio Schneiders), advised her to call back to reschedule procedure when pt is feeling better.

## 2021-05-19 NOTE — Patient Instructions (Signed)
Gregory Mitchell  05/19/2021     @PREFPERIOPPHARMACY @   Your procedure is scheduled on  05/15/2021.   Report to [name] at  [email or web address]   Call this number if you have problems the morning of surgery:  [phone]   Remember:  Follow the diet and prep instructions given to you by the office.    DO NOT take any medications for diabetes the morning of your procedure.  Your last dose of eliquis should be   Use your nebulizer and your inhaler before you come and bring your rescue inhaler with you.   Take these medicines the morning of surgery with A SIP OF WATER         carvedilol, nexium, hydrocodone (If needed), entresto, flomax.     Do not wear jewelry, make-up or nail polish.  Do not wear lotions, powders, or perfumes, or deodorant.  Do not shave 48 hours prior to surgery.  Men may shave face and neck.  Do not bring valuables to the hospital.  [facility] is not responsible for any belongings or valuables.  Contacts, dentures or bridgework may not be worn into surgery.  Leave your suitcase in the car.  After surgery it may be brought to your room.  For patients admitted to the hospital, discharge time will be determined by your treatment team.  Patients discharged the day of surgery will not be allowed to drive home  and must have someone with them for 24 hours.    Special instructions:   DO NOT smoke tobacco or vape for 24 hours before your procedure.  Please read over the following fact sheets that you were given. Anesthesia Post-op Instructions and Care and Recovery After Surgery      Upper Endoscopy, Adult, Care After This sheet gives you information about how to care for yourself after your procedure. Your health care provider may also give you more specific instructions. If you have problems or questions, contact your health care provider. What can I expect after the procedure? After the procedure, it is common to have: A sore throat. Mild  stomach pain or discomfort. Bloating. Nausea. Follow these instructions at home:  Follow instructions from your health care provider about what to eat or drink after your procedure. Return to your normal activities as told by your health care provider. Ask your health care provider what activities are safe for you. Take over-the-counter and prescription medicines only as told by your health care provider. If you were given a sedative during the procedure, it can affect you for several hours. Do not drive or operate machinery until your health care provider says that it is safe. Keep all follow-up visits as told by your health care provider. This is important. Contact a health care provider if you have: A sore throat that lasts longer than one day. Trouble swallowing. Get help right away if: You vomit blood or your vomit looks like coffee grounds. You have: A fever. Bloody, black, or tarry stools. A severe sore throat or you cannot swallow. Difficulty breathing. Severe pain in your chest or abdomen. Summary After the procedure, it is common to have a sore throat, mild stomach discomfort, bloating, and nausea. If you were given a sedative during the procedure, it can affect you for several hours. Do not drive or operate machinery until your health care provider says that it is safe. Follow instructions from your health care provider about what  to eat or drink after your procedure. Return to your normal activities as told by your health care provider. This information is not intended to replace advice given to you by your health care provider. Make sure you discuss any questions you have with your health care provider. Document Revised: 07/24/2019 Document Reviewed: 12/26/2017 Elsevier Patient Education  2022 [place]. Esophageal Dilatation Esophageal dilatation, also called esophageal dilation, is a procedure to widen or open a blocked or narrowed part of the esophagus. The esophagus  is the part of the body that moves food and liquid from the mouth to the stomach. You may need this procedure if: You have a buildup of scar tissue in your esophagus that makes it difficult, painful, or impossible to swallow. This can be caused by gastroesophageal reflux disease (GERD). You have cancer of the esophagus. There is a problem with how food moves through your esophagus. In some cases, you may need this procedure repeated at a later time to dilate the esophagus gradually. Tell a health care provider about: Any allergies you have. All medicines you are taking, including vitamins, herbs, eye drops, creams, and over-the-counter medicines. Any problems you or family members have had with anesthetic medicines. Any blood disorders you have. Any surgeries you have had. Any medical conditions you have. Any antibiotic medicines you are required to take before dental procedures. Whether you are pregnant or may be pregnant. What are the risks? Generally, this is a safe procedure. However, problems may occur, including: Bleeding due to a tear in the lining of the esophagus. A hole, or perforation, in the esophagus. What happens before the procedure? Ask your health care provider about: Changing or stopping your regular medicines. This is especially important if you are taking diabetes medicines or blood thinners. Taking medicines such as aspirin and ibuprofen. These medicines can thin your blood. Do not take these medicines unless your health care provider tells you to take them. Taking over-the-counter medicines, vitamins, herbs, and supplements. Follow instructions from your health care provider about eating or drinking restrictions. Plan to have a responsible adult take you home from the hospital or clinic. Plan to have a responsible adult care for you for the time you are told after you leave the hospital or clinic. This is important. What happens during the procedure? You may be given  a medicine to help you relax (sedative). A numbing medicine may be sprayed into the back of your throat, or you may gargle the medicine. Your health care provider may perform the dilatation using various surgical instruments, such as: Simple dilators. This instrument is carefully placed in the esophagus to stretch it. Guided wire bougies. This involves using an endoscope to insert a wire into the esophagus. A dilator is passed over this wire to enlarge the esophagus. Then the wire is removed. Balloon dilators. An endoscope with a small balloon is inserted into the esophagus. The balloon is inflated to stretch the esophagus and open it up. The procedure may vary among health care providers and hospitals. What can I expect after the procedure? Your blood pressure, heart rate, breathing rate, and blood oxygen level will be monitored until you leave the hospital or clinic. Your throat may feel slightly sore and numb. This will get better over time. You will not be allowed to eat or drink until your throat is no longer numb. When you are able to drink, urinate, and sit on the edge of the bed without nausea or dizziness, you may be able  to return home. Follow these instructions at home: Take over-the-counter and prescription medicines only as told by your health care provider. If you were given a sedative during the procedure, it can affect you for several hours. Do not drive or operate machinery until your health care provider says that it is safe. Plan to have a responsible adult care for you for the time you are told. This is important. Follow instructions from your health care provider about any eating or drinking restrictions. Do not use any products that contain nicotine or tobacco, such as cigarettes, e-cigarettes, and chewing tobacco. If you need help quitting, ask your health care provider. Keep all follow-up visits. This is important. Contact a health care provider if: You have a fever. You  have pain that is not relieved by medicine. Get help right away if: You have chest pain. You have trouble breathing. You have trouble swallowing. You vomit blood. You have black, tarry, or bloody stools. These symptoms may represent a serious problem that is an emergency. Do not wait to see if the symptoms will go away. Get medical help right away. Call your local emergency services (911 in the [place] Do not drive yourself to the hospital. Summary Esophageal dilatation, also called esophageal dilation, is a procedure to widen or open a blocked or narrowed part of the esophagus. Plan to have a responsible adult take you home from the hospital or clinic. For this procedure, a numbing medicine may be sprayed into the back of your throat, or you may gargle the medicine. Do not drive or operate machinery until your health care provider says that it is safe. This information is not intended to replace advice given to you by your health care provider. Make sure you discuss any questions you have with your health care provider. Document Revised: 12/12/2019 Document Reviewed: 12/12/2019 Elsevier Patient Education  [place]. Flexible Sigmoidoscopy, Care After This sheet gives you information about how to care for yourself after your procedure. Your health care provider may also give you more specific instructions. If you have problems or questions, contact your health care provider. What can I expect after the procedure? After the procedure, it is common to have: Cramping or pain in your abdomen. Bloating. A small amount of blood with your bowel movements. This may happen if a sample of tissue was removed for testing (biopsy). Follow these instructions at home: Eating and drinking  Drink enough fluid to keep your urine pale yellow. Follow instructions from your health care provider about eating or drinking restrictions. Resume your normal diet as instructed by your health care provider.  Avoid heavy or fried foods that are hard to digest. Activity  If you were given a medicine to help you relax (sedative) during the procedure, it can affect you for several hours. Do not drive or operate machinery until your health care provider says that it is safe. Rest as told by your health care provider. Return to your normal activities as told by your health care provider. Ask your health care provider what activities are safe for you. General instructions Take over-the-counter and prescription medicines only as told by your health care provider. Try walking around when you have cramps or feel bloated. Keep all follow-up visits as told by your health care provider. This is important. Contact a health care provider if: You have pain or cramping in your abdomen that gets worse or is not helped with medicine. You have a small amount of bleeding from your  rectum that continues after 24 hours. You have nausea or vomiting. You feel weak or dizzy. You develop a fever. Get help right away if: You pass large blood clots or see a large amount of blood in the toilet after having a bowel movement. You have severe pain in your abdomen. You have nausea or vomiting for more than 24 hours after the procedure. Summary After the procedure, you may have cramping or pain in your abdomen or you may have bloating. If you had a sample of tissue removed (biopsy), you may have a small amount of blood with your bowel movements. Resume your normal diet as instructed by your health care provider. Avoid heavy or fried foods that are hard to digest. Try walking around when you have cramps or feel bloated. Get help right away if you pass large blood clots or see a large amount of blood in the toilet after having a bowel movement. This information is not intended to replace advice given to you by your health care provider. Make sure you discuss any questions you have with your health care provider. Document Revised:  07/23/2019 Document Reviewed: 07/23/2019 Elsevier Patient Education  2022 [place] After This sheet gives you information about how to care for yourself after your procedure. Your health care provider may also give you more specific instructions. If you have problems or questions, contact your health care provider. What can I expect after the procedure? After the procedure, it is common to have: Tiredness. Forgetfulness about what happened after the procedure. Impaired judgment for important decisions. Nausea or vomiting. Some difficulty with balance. Follow these instructions at home: For the time period you were told by your health care provider:   Rest as needed. Do not participate in activities where you could fall or become injured. Do not drive or use machinery. Do not drink alcohol. Do not take sleeping pills or medicines that cause drowsiness. Do not make important decisions or sign legal documents. Do not take care of children on your own. Eating and drinking Follow the diet that is recommended by your health care provider. Drink enough fluid to keep your urine pale yellow. If you vomit: Drink water, juice, or soup when you can drink without vomiting. Make sure you have little or no nausea before eating solid foods. General instructions Have a responsible adult stay with you for the time you are told. It is important to have someone help care for you until you are awake and alert. Take over-the-counter and prescription medicines only as told by your health care provider. If you have sleep apnea, surgery and certain medicines can increase your risk for breathing problems. Follow instructions from your health care provider about wearing your sleep device: Anytime you are sleeping, including during daytime naps. While taking prescription pain medicines, sleeping medicines, or medicines that make you drowsy. Avoid smoking. Keep all  follow-up visits as told by your health care provider. This is important. Contact a health care provider if: You keep feeling nauseous or you keep vomiting. You feel light-headed. You are still sleepy or having trouble with balance after 24 hours. You develop a rash. You have a fever. You have redness or swelling around the IV site. Get help right away if: You have trouble breathing. You have new-onset confusion at home. Summary For several hours after your procedure, you may feel tired. You may also be forgetful and have poor judgment. Have a responsible adult stay with you for the  time you are told. It is important to have someone help care for you until you are awake and alert. Rest as told. Do not drive or operate machinery. Do not drink alcohol or take sleeping pills. Get help right away if you have trouble breathing, or if you suddenly become confused. This information is not intended to replace advice given to you by your health care provider. Make sure you discuss any questions you have with your health care provider. Document Revised: 04/10/2020 Document Reviewed: 06/28/2019 Elsevier Patient Education  2022 [employer].

## 2021-05-20 NOTE — Telephone Encounter (Signed)
Noted  

## 2021-05-21 ENCOUNTER — Encounter (HOSPITAL_COMMUNITY)
Admission: RE | Admit: 2021-05-21 | Discharge: 2021-05-21 | Disposition: A | Discharge status: Home / Self Care | Payer: Medicare HMO | Source: Ambulatory Visit | Attending: Internal Medicine | Admitting: Internal Medicine

## 2021-05-25 ENCOUNTER — Telehealth: Payer: Self-pay | Admitting: Internal Medicine

## 2021-05-25 ENCOUNTER — Encounter (HOSPITAL_COMMUNITY): Admission: RE | Payer: Self-pay | Source: Home / Self Care

## 2021-05-25 ENCOUNTER — Ambulatory Visit (HOSPITAL_COMMUNITY): Admission: RE | Admit: 2021-05-25 | Payer: Medicare HMO | Source: Home / Self Care

## 2021-05-25 SURGERY — SIGMOIDOSCOPY, FLEXIBLE
Anesthesia: Monitor Anesthesia Care

## 2021-05-25 NOTE — Telephone Encounter (Signed)
[phone] PATIENT DAUGHTER, RACHEL, CALLED WANTING TO SPEAK TO SOMEONE ABOUT HER FATHERS LAB RESULTS   HIS PCP SAID HIS HEMOGLOBIN IS LOW

## 2021-05-25 NOTE — Telephone Encounter (Signed)
The pt's daughter phoned to advise [place] that the Pulmonologist her father see's told her to advise [place] and the pt's PCP that his hemoglobin is low. I looked back at the results and this pt's hemoglobin has been dropping for over a year now. Procedure the pt had with you had to be cancelled due to pt was in hospital per daughter. Please advise

## 2021-06-01 ENCOUNTER — Telehealth: Payer: Self-pay | Admitting: Internal Medicine

## 2021-06-01 NOTE — Telephone Encounter (Signed)
Is pt needing to be rescheduled? We haven't received any notes.

## 2021-06-01 NOTE — Telephone Encounter (Signed)
If patient is having worsening anemia, we can consider doing colonoscopy at the same time as his EGD.  I am unsure if he would be able to tolerate the prep given his swallowing issues.  When they call to reschedule EGD, please discuss this with them and add on colonoscopy if agreeable.

## 2021-06-01 NOTE — Telephone Encounter (Signed)
Phoned and spoke with the pt's daughter Apolonio Schneiders. She advised that sure was not sure about the pt's anemia getting worse, but the pt's pulmonologist referred him to a hematologist and they have not been evaluated yet. Apolonio Schneiders is still waiting on them to call with an appointment regarding his anemia. She stated she will reach out to them to see when his appt is and get back with [place]. She is agreeable to the pt having EGD and colonoscopy at the same time.

## 2021-06-01 NOTE — Telephone Encounter (Signed)
Called pt daughter and spoke with Apolonio Schneiders. She was agreeable with pt having TCS done with EGD/DIL at the same time. Does he also need flex sig done as this was also scheduled previously? She also wants to know if you feel pt should see cardiology before we schedule him also

## 2021-06-01 NOTE — Telephone Encounter (Signed)
Pt's daughter called to schedule patient's procedure. I told her that the scheduler would have to call her back that she was in a meeting. [phone]

## 2021-06-01 NOTE — Telephone Encounter (Signed)
See prior note sent to Dr. Abbey Chatters

## 2021-06-02 NOTE — Telephone Encounter (Signed)
Dr. Abbey Chatters This pt has cancelled/no showed for his last 2 appts with his Cardiologist (Dr Harl Bowie) 11-21-20 and the 03-10-21 visit.  Not sure if the pt will be cleared at this point because he is trying to get in to see his hemologist at this point. I will do another letter to Dr. Harl Bowie and add colonoscopy with EGD (not flex sig)., but it is up to you if you want clearance from his last ov with Dr. Harl Bowie. Please advise so I can get on the letter this week.   thanks

## 2021-06-02 NOTE — Telephone Encounter (Signed)
Gregory Mitchell, can we reach out again to cardiology to review to make sure he is okay to proceed? Thanks!

## 2021-06-02 NOTE — Telephone Encounter (Signed)
noted 

## 2021-06-02 NOTE — Telephone Encounter (Signed)
No need for flex sig as we will be evaluating his entire colon with full colonoscopy to evaluate his anemia as well as rectal thickening.  I believe prior to scheduling his previous procedures, we were going to reach out to his cardiologist in regards to holding his apixaban.  Unsure if this was done but if not we will need to reach out to them.  Thank you

## 2021-06-04 NOTE — Telephone Encounter (Signed)
Letter re-done to pt's PCP ---changing from Flex-sig to colonoscopy

## 2021-06-10 ENCOUNTER — Ambulatory Visit: Payer: Medicare HMO | Admitting: Internal Medicine

## 2021-06-11 NOTE — Telephone Encounter (Signed)
Dr. Abbey Chatters   I phoned and spoke with the pt's daughter and I asked her was she still continuing to wait on the appt with the Hematologist or is it ok to schedule the pt for his procedures. The pt's daughter advised me that the pt had been seen in the hospital recently and she wants you to look at the notes to see if its ok to have procedures now or wait until he see's the heart Dr in December because pt's daughter is unsure at this point on what they should do. Please advise

## 2021-06-18 NOTE — Telephone Encounter (Signed)
Pt's insurance [employer] has alternate drugs for the pt that takes the place of [place]. They are as follows: Omeprazole Pantoprazole Dexlansoprazole or covered alternatives.

## 2021-06-19 ENCOUNTER — Telehealth: Payer: Self-pay

## 2021-06-19 NOTE — Telephone Encounter (Signed)
Dr. Abbey Chatters, Did you have a chance to speak with the pt and his daughter regarding this. They were wanting to know about waiting/moving forward with EGD/Colonoscopy. Pt has appt with his heart Dr. December 1st. And waiting to see hematologist who another facility was setting the pt up to see. Please advise or speak with daughter to kind of lead in the direction they should go.

## 2021-06-30 NOTE — Telephone Encounter (Signed)
Phoned the pt and his daughter and LMOVM for them to return call regarding colonoscopy and endoscopy moving forward. Dr. Abbey Chatters is aware of everything we spoke about and Dr. Sherrie Sport approved for the pt to hold Eliquis x 48 hours prior to procedure on 06/02/2021. (Documentation was faxed from his office).

## 2021-07-07 NOTE — Telephone Encounter (Signed)
FYI: Returned the daughters call regarding her father being scheduled for colonoscopy and endoscopy. I was advised by his daughter that the pt is on his 4th day being in [facility] with pneumonia and flu. She states the procedures will have to be put off for the time being. They will call us when the pt is well enough to have the procedures.

## 2021-07-07 NOTE — Telephone Encounter (Signed)
noted 

## 2021-07-09 ENCOUNTER — Ambulatory Visit: Payer: Medicare HMO | Admitting: Cardiology

## 2021-07-09 NOTE — Progress Notes (Incomplete)
Clinical Summary Gregory Mitchell is a 80 y.o.male   Past Medical History:  Diagnosis Date   Asthma    Chronic renal insufficiency    COPD (chronic obstructive pulmonary disease) ([place])    Heart failure (HCC)    Acute on chronic combined systolic and diastolic heart failure   Hypertension    Pulmonary emboli ([place]) 03/2019   Renal insufficiency    Type 2 diabetes mellitus (HCC)      Allergies  Allergen Reactions   Amoxicillin Other (See Comments)    Makes eyes turn yellow and burn   Ciprofloxacin     Pt not sure   Sulfa Antibiotics Rash    Pt not sure of reaction     Current Outpatient Medications  Medication Sig Dispense Refill   albuterol (VENTOLIN HFA) 108 (90 Base) MCG/ACT inhaler Inhale 2 puffs into the lungs every 6 (six) hours as needed for wheezing or shortness of breath.      apixaban (ELIQUIS) 5 MG TABS tablet Take 5 mg by mouth 2 (two) times daily.     carvedilol (COREG) 6.25 MG tablet Take 1 tablet (6.25 mg total) by mouth 2 (two) times daily. 180 tablet 1   DALIRESP 250 MCG TABS Take 1 tablet by mouth daily. (Patient not taking: Reported on 04/29/2021)     dapagliflozin propanediol (FARXIGA) 10 MG TABS tablet Take 1 tablet (10 mg total) by mouth daily before breakfast. 30 tablet 11   diclofenac Sodium (VOLTAREN) 1 % GEL Apply 1 application topically 4 (four) times daily as needed (pain).     esomeprazole (NEXIUM) 40 MG capsule Take 1 capsule (40 mg total) by mouth 2 (two) times daily before a meal. 180 capsule 3   HYDROcodone-acetaminophen (NORCO) 7.5-325 MG tablet Take 1 tablet by mouth 2 (two) times daily as needed.     ipratropium-albuterol (DUONEB) 0.5-2.5 (3) MG/3ML SOLN Take 3 mLs by nebulization every 6 (six) hours as needed (for shortness of breath/wheezing).      metFORMIN (GLUCOPHAGE-XR) 500 MG 24 hr tablet Take 1,000 mg by mouth 2 (two) times daily. (Patient not taking: Reported on 04/29/2021)     mirtazapine (REMERON) 15 MG tablet Take 15 mg by mouth  at bedtime.     OXYGEN Inhale 2 L into the lungs at bedtime as needed (for shortness of breath).     sacubitril-valsartan (ENTRESTO) 24-26 MG Take 1 tablet by mouth 2 (two) times daily.     SM SENNA LAXATIVE 8.6 MG tablet Take 2 tablets by mouth 2 (two) times daily.     tamsulosin (FLOMAX) 0.4 MG CAPS capsule Take 0.8 mg by mouth 2 (two) times daily.     torsemide (DEMADEX) 20 MG tablet Take 1 tablet (20 mg total) by mouth 2 (two) times daily. 60 tablet 6   No current facility-administered medications for this visit.     Past Surgical History:  Procedure Laterality Date   APPENDECTOMY     BALLOON DILATION N/A 03/18/2020   Procedure: BALLOON DILATION;  Surgeon: Eloise Harman, DO;  Location: [facility];  Service: Endoscopy;  Laterality: N/A;   CHOLECYSTECTOMY     COLONOSCOPY  10/2006   Dr.Anwar:normal   ESOPHAGOGASTRODUODENOSCOPY (EGD) WITH PROPOFOL N/A 03/18/2020   Procedure: ESOPHAGOGASTRODUODENOSCOPY (EGD) WITH PROPOFOL;  Surgeon: Eloise Harman, DO;  Location: [facility];  Service: Endoscopy;  Laterality: N/A;  2:30pm   NASAL SINUS SURGERY     NEPHRECTOMY     RIGHT (Question CA.  No further therapy needed.)   RIGHT/LEFT HEART CATH AND CORONARY ANGIOGRAPHY N/A 09/03/2019   Procedure: RIGHT/LEFT HEART CATH AND CORONARY ANGIOGRAPHY;  Surgeon: Lorretta Harp, MD;  Location: [facility];  Service: Cardiovascular;  Laterality: N/A;     Allergies  Allergen Reactions   Amoxicillin Other (See Comments)    Makes eyes turn yellow and burn   Ciprofloxacin     Pt not sure   Sulfa Antibiotics Rash    Pt not sure of reaction      Family History  Problem Relation Age of Onset   Lung cancer Brother    Stroke Mother    Breast cancer Mother    Colon cancer Neg Hx      Social History Gregory Mitchell reports that he quit smoking about 44 years ago. His smoking use included cigarettes. He started smoking about 64 years ago. He has a 15.00 pack-year smoking history. He  quit smokeless tobacco use about 15 years ago.  His smokeless tobacco use included chew. Gregory Mitchell reports that he does not currently use alcohol.   Review of Systems CONSTITUTIONAL: No weight loss, fever, chills, weakness or fatigue.  HEENT: Eyes: No visual loss, blurred vision, double vision or yellow sclerae.No hearing loss, sneezing, congestion, runny nose or sore throat.  SKIN: No rash or itching.  CARDIOVASCULAR:  RESPIRATORY: No shortness of breath, cough or sputum.  GASTROINTESTINAL: No anorexia, nausea, vomiting or diarrhea. No abdominal pain or blood.  GENITOURINARY: No burning on urination, no polyuria NEUROLOGICAL: No headache, dizziness, syncope, paralysis, ataxia, numbness or tingling in the extremities. No change in bowel or bladder control.  MUSCULOSKELETAL: No muscle, back pain, joint pain or stiffness.  LYMPHATICS: No enlarged nodes. No history of splenectomy.  PSYCHIATRIC: No history of depression or anxiety.  ENDOCRINOLOGIC: No reports of sweating, cold or heat intolerance. No polyuria or polydipsia.  [name]   Physical Examination There were no vitals filed for this visit. There were no vitals filed for this visit.  Gen: resting comfortably, no acute distress HEENT: no scleral icterus, pupils equal round and reactive, no palptable cervical adenopathy,  CV Resp: Clear to auscultation bilaterally GI: abdomen is soft, non-tender, non-distended, normal bowel sounds, no hepatosplenomegaly MSK: extremities are warm, no edema.  Skin: warm, no rash Neuro:  no focal deficits Psych: appropriate affect   Diagnostic Studies     Assessment and Plan        Arnoldo Lenis, M.D., F.A.C.C.

## 2021-08-19 ENCOUNTER — Telehealth: Payer: Self-pay | Admitting: Cardiology

## 2021-08-19 NOTE — Telephone Encounter (Signed)
Daughter calling looking to get the patient appt because he had a CT done and that was plaque build up in his artery that could cause a blockage in his heart. Please advise

## 2021-08-20 NOTE — Telephone Encounter (Incomplete Revision)
Spoke with daughter Nikita Humble) - informed her that we will have Dr. Harl Bowie review the CT (in care everywhere).  When do you want him back in the office - initially scheduled for December, but was cancelled due to him being in the hospital.

## 2021-08-20 NOTE — Telephone Encounter (Addendum)
Spoke with daughter Wilburn Keir) - informed her that we will have Dr. Harl Bowie review the CT (in care everywhere).  When do you want him back in the office - initially scheduled for December, but was cancelled due to him being in the hospital.

## 2021-08-21 NOTE — Telephone Encounter (Signed)
Daughter Apolonio Schneiders) notified & verbalized understanding.  Follow up OV given for February in [place] office.

## 2021-08-21 NOTE — Telephone Encounter (Signed)
CT shows just some plaque in the arteries which would be very common at his age, we know from his heart catheterization just a few years ago that there is not enough plaque there to block up any of the arteries. Nothing on CT scan would change current management  Zandra Abts MD

## 2021-08-22 ENCOUNTER — Emergency Department (HOSPITAL_COMMUNITY): Payer: Medicare HMO

## 2021-08-22 ENCOUNTER — Inpatient Hospital Stay (HOSPITAL_COMMUNITY)
Admission: EM | Admit: 2021-08-22 | Discharge: 2021-08-25 | DRG: 377 | Disposition: A | Discharge status: Home Health | Payer: Medicare HMO | Attending: Internal Medicine | Admitting: Internal Medicine

## 2021-08-22 DIAGNOSIS — I5042 Chronic combined systolic (congestive) and diastolic (congestive) heart failure: Secondary | ICD-10-CM | POA: Diagnosis present

## 2021-08-22 DIAGNOSIS — I130 Hypertensive heart and chronic kidney disease with heart failure and stage 1 through stage 4 chronic kidney disease, or unspecified chronic kidney disease: Secondary | ICD-10-CM | POA: Diagnosis present

## 2021-08-22 DIAGNOSIS — Z905 Acquired absence of kidney: Secondary | ICD-10-CM

## 2021-08-22 DIAGNOSIS — Z882 Allergy status to sulfonamides status: Secondary | ICD-10-CM | POA: Diagnosis not present

## 2021-08-22 DIAGNOSIS — Z20822 Contact with and (suspected) exposure to covid-19: Secondary | ICD-10-CM | POA: Diagnosis present

## 2021-08-22 DIAGNOSIS — I10 Essential (primary) hypertension: Secondary | ICD-10-CM | POA: Diagnosis present

## 2021-08-22 DIAGNOSIS — B3781 Candidal esophagitis: Secondary | ICD-10-CM | POA: Diagnosis present

## 2021-08-22 DIAGNOSIS — L89152 Pressure ulcer of sacral region, stage 2: Secondary | ICD-10-CM | POA: Diagnosis present

## 2021-08-22 DIAGNOSIS — J440 Chronic obstructive pulmonary disease with acute lower respiratory infection: Secondary | ICD-10-CM | POA: Diagnosis present

## 2021-08-22 DIAGNOSIS — R933 Abnormal findings on diagnostic imaging of other parts of digestive tract: Secondary | ICD-10-CM | POA: Diagnosis not present

## 2021-08-22 DIAGNOSIS — R1310 Dysphagia, unspecified: Secondary | ICD-10-CM | POA: Diagnosis not present

## 2021-08-22 DIAGNOSIS — R079 Chest pain, unspecified: Secondary | ICD-10-CM | POA: Diagnosis present

## 2021-08-22 DIAGNOSIS — D649 Anemia, unspecified: Secondary | ICD-10-CM

## 2021-08-22 DIAGNOSIS — N1831 Chronic kidney disease, stage 3a: Secondary | ICD-10-CM | POA: Diagnosis not present

## 2021-08-22 DIAGNOSIS — Z7901 Long term (current) use of anticoagulants: Secondary | ICD-10-CM | POA: Diagnosis not present

## 2021-08-22 DIAGNOSIS — J189 Pneumonia, unspecified organism: Secondary | ICD-10-CM | POA: Diagnosis present

## 2021-08-22 DIAGNOSIS — K219 Gastro-esophageal reflux disease without esophagitis: Secondary | ICD-10-CM | POA: Diagnosis present

## 2021-08-22 DIAGNOSIS — K2970 Gastritis, unspecified, without bleeding: Secondary | ICD-10-CM | POA: Diagnosis present

## 2021-08-22 DIAGNOSIS — J441 Chronic obstructive pulmonary disease with (acute) exacerbation: Secondary | ICD-10-CM | POA: Diagnosis present

## 2021-08-22 DIAGNOSIS — L89159 Pressure ulcer of sacral region, unspecified stage: Secondary | ICD-10-CM | POA: Diagnosis not present

## 2021-08-22 DIAGNOSIS — I2510 Atherosclerotic heart disease of native coronary artery without angina pectoris: Secondary | ICD-10-CM | POA: Diagnosis present

## 2021-08-22 DIAGNOSIS — K922 Gastrointestinal hemorrhage, unspecified: Secondary | ICD-10-CM | POA: Diagnosis not present

## 2021-08-22 DIAGNOSIS — Z86711 Personal history of pulmonary embolism: Secondary | ICD-10-CM | POA: Diagnosis not present

## 2021-08-22 DIAGNOSIS — Z881 Allergy status to other antibiotic agents status: Secondary | ICD-10-CM | POA: Diagnosis not present

## 2021-08-22 DIAGNOSIS — I428 Other cardiomyopathies: Secondary | ICD-10-CM | POA: Diagnosis present

## 2021-08-22 DIAGNOSIS — E119 Type 2 diabetes mellitus without complications: Secondary | ICD-10-CM

## 2021-08-22 DIAGNOSIS — D62 Acute posthemorrhagic anemia: Secondary | ICD-10-CM | POA: Diagnosis present

## 2021-08-22 DIAGNOSIS — D509 Iron deficiency anemia, unspecified: Secondary | ICD-10-CM | POA: Diagnosis present

## 2021-08-22 DIAGNOSIS — K5909 Other constipation: Secondary | ICD-10-CM | POA: Diagnosis present

## 2021-08-22 DIAGNOSIS — N1832 Chronic kidney disease, stage 3b: Secondary | ICD-10-CM | POA: Diagnosis present

## 2021-08-22 DIAGNOSIS — Z9049 Acquired absence of other specified parts of digestive tract: Secondary | ICD-10-CM

## 2021-08-22 DIAGNOSIS — K921 Melena: Principal | ICD-10-CM | POA: Diagnosis present

## 2021-08-22 DIAGNOSIS — Z79899 Other long term (current) drug therapy: Secondary | ICD-10-CM

## 2021-08-22 DIAGNOSIS — E1122 Type 2 diabetes mellitus with diabetic chronic kidney disease: Secondary | ICD-10-CM | POA: Diagnosis present

## 2021-08-22 DIAGNOSIS — D539 Nutritional anemia, unspecified: Secondary | ICD-10-CM | POA: Diagnosis present

## 2021-08-22 DIAGNOSIS — Z9981 Dependence on supplemental oxygen: Secondary | ICD-10-CM | POA: Diagnosis not present

## 2021-08-22 DIAGNOSIS — R1319 Other dysphagia: Secondary | ICD-10-CM | POA: Diagnosis not present

## 2021-08-22 DIAGNOSIS — Z87891 Personal history of nicotine dependence: Secondary | ICD-10-CM

## 2021-08-22 LAB — CBC
HCT: 28.9 % — ABNORMAL LOW (ref 39.0–52.0)
Hemoglobin: 8.2 g/dL — ABNORMAL LOW (ref 13.0–17.0)
MCH: 28.7 pg (ref 26.0–34.0)
MCHC: 28.4 g/dL — ABNORMAL LOW (ref 30.0–36.0)
MCV: 101 fL — ABNORMAL HIGH (ref 80.0–100.0)
Platelets: 327 10*3/uL (ref 150–400)
RBC: 2.86 MIL/uL — ABNORMAL LOW (ref 4.22–5.81)
RDW: 18 % — ABNORMAL HIGH (ref 11.5–15.5)
WBC: 9.7 10*3/uL (ref 4.0–10.5)
nRBC: 0 % (ref 0.0–0.2)

## 2021-08-22 LAB — BLOOD GAS, VENOUS
Acid-Base Excess: 1.6 mmol/L (ref 0.0–2.0)
Bicarbonate: 24.7 mmol/L (ref 20.0–28.0)
Drawn by: 1580
FIO2: 21
O2 Saturation: 44.6 %
Patient temperature: 37.1
pCO2, Ven: 51.7 mmHg (ref 44.0–60.0)
pH, Ven: 7.335 (ref 7.250–7.430)
pO2, Ven: <31 mmHg — CL (ref 32.0–45.0)

## 2021-08-22 LAB — TROPONIN I (HIGH SENSITIVITY)
Troponin I (High Sensitivity): 34 ng/L — ABNORMAL HIGH (ref <18)
Troponin I (High Sensitivity): 35 ng/L — ABNORMAL HIGH (ref <18)

## 2021-08-22 LAB — BASIC METABOLIC PANEL
Anion gap: 3 — ABNORMAL LOW (ref 5–15)
BUN: 26 mg/dL — ABNORMAL HIGH (ref 8–23)
CO2: 26 mmol/L (ref 22–32)
Calcium: 7.8 mg/dL — ABNORMAL LOW (ref 8.9–10.3)
Chloride: 112 mmol/L — ABNORMAL HIGH (ref 98–111)
Creatinine, Ser: 1.29 mg/dL — ABNORMAL HIGH (ref 0.61–1.24)
GFR, Estimated: 56 mL/min — ABNORMAL LOW (ref >60)
Glucose, Bld: 94 mg/dL (ref 70–99)
Potassium: 3.8 mmol/L (ref 3.5–5.1)
Sodium: 141 mmol/L (ref 135–145)

## 2021-08-22 LAB — PREPARE RBC (CROSSMATCH)

## 2021-08-22 LAB — RESP PANEL BY RT-PCR (FLU A&B, COVID) ARPGX2
Influenza A by PCR: NEGATIVE
Influenza B by PCR: NEGATIVE
SARS Coronavirus 2 by RT PCR: NEGATIVE

## 2021-08-22 LAB — BRAIN NATRIURETIC PEPTIDE: B Natriuretic Peptide: 639 pg/mL — ABNORMAL HIGH (ref 0.0–100.0)

## 2021-08-22 LAB — GLUCOSE, CAPILLARY: Glucose-Capillary: 293 mg/dL — ABNORMAL HIGH (ref 70–99)

## 2021-08-22 LAB — PROTIME-INR
INR: 1 (ref 0.8–1.2)
Prothrombin Time: 13.4 seconds (ref 11.4–15.2)

## 2021-08-22 LAB — POC OCCULT BLOOD, ED: Fecal Occult Bld: POSITIVE — AB

## 2021-08-22 MED ORDER — ONDANSETRON HCL 4 MG PO TABS: 4.0000 mg | ORAL_TABLET | Freq: Four times a day (QID) | ORAL | Status: DC | PRN

## 2021-08-22 MED ORDER — IPRATROPIUM-ALBUTEROL 0.5-2.5 (3) MG/3ML IN SOLN
3.0000 mL | Freq: Four times a day (QID) | RESPIRATORY_TRACT | Status: DC
  Administered 2021-08-22: 3 mL via RESPIRATORY_TRACT
  Filled 2021-08-22: qty 3

## 2021-08-22 MED ORDER — PANTOPRAZOLE SODIUM 40 MG IV SOLR
40.0000 mg | Freq: Once | INTRAVENOUS | Status: AC
  Administered 2021-08-22: 40 mg via INTRAVENOUS
  Filled 2021-08-22: qty 40

## 2021-08-22 MED ORDER — HYDROCODONE-ACETAMINOPHEN 7.5-325 MG PO TABS
1.0000 | ORAL_TABLET | Freq: Two times a day (BID) | ORAL | Status: DC | PRN
  Administered 2021-08-22 – 2021-08-24 (×3): 1 via ORAL
  Filled 2021-08-22 (×3): qty 1

## 2021-08-22 MED ORDER — SACUBITRIL-VALSARTAN 24-26 MG PO TABS
1.0000 | ORAL_TABLET | Freq: Two times a day (BID) | ORAL | Status: DC
  Administered 2021-08-22 – 2021-08-25 (×6): 1 via ORAL
  Filled 2021-08-22 (×6): qty 1

## 2021-08-22 MED ORDER — PANTOPRAZOLE SODIUM 40 MG IV SOLR
40.0000 mg | Freq: Two times a day (BID) | INTRAVENOUS | Status: DC
  Administered 2021-08-22 – 2021-08-25 (×6): 40 mg via INTRAVENOUS
  Filled 2021-08-22 (×6): qty 40

## 2021-08-22 MED ORDER — IPRATROPIUM-ALBUTEROL 0.5-2.5 (3) MG/3ML IN SOLN
3.0000 mL | Freq: Once | RESPIRATORY_TRACT | Status: AC
  Administered 2021-08-22: 3 mL via RESPIRATORY_TRACT
  Filled 2021-08-22: qty 3

## 2021-08-22 MED ORDER — INSULIN ASPART 100 UNIT/ML IJ SOLN
0.0000 [IU] | Freq: Three times a day (TID) | INTRAMUSCULAR | Status: DC
  Administered 2021-08-23 – 2021-08-24 (×3): 2 [IU] via SUBCUTANEOUS

## 2021-08-22 MED ORDER — TAMSULOSIN HCL 0.4 MG PO CAPS
0.8000 mg | ORAL_CAPSULE | Freq: Two times a day (BID) | ORAL | Status: DC
Start: 2021-08-22 — End: 2021-08-25
  Administered 2021-08-22 – 2021-08-25 (×6): 0.8 mg via ORAL
  Filled 2021-08-22 (×6): qty 2

## 2021-08-22 MED ORDER — SODIUM CHLORIDE 0.9 % IV SOLN: 2.0000 g | INTRAVENOUS | Status: DC

## 2021-08-22 MED ORDER — SODIUM CHLORIDE 0.9% IV SOLUTION: Freq: Once | INTRAVENOUS | Status: DC

## 2021-08-22 MED ORDER — METHYLPREDNISOLONE SODIUM SUCC 125 MG IJ SOLR
125.0000 mg | Freq: Once | INTRAMUSCULAR | Status: AC
  Administered 2021-08-22: 125 mg via INTRAVENOUS
  Filled 2021-08-22: qty 2

## 2021-08-22 MED ORDER — ONDANSETRON HCL 4 MG/2ML IJ SOLN: 4.0000 mg | Freq: Four times a day (QID) | INTRAMUSCULAR | Status: DC | PRN

## 2021-08-22 MED ORDER — FLUTICASONE-UMECLIDIN-VILANT 200-62.5-25 MCG/ACT IN AEPB: 1.0000 | INHALATION_SPRAY | Freq: Every day | RESPIRATORY_TRACT | Status: DC

## 2021-08-22 MED ORDER — LACTULOSE 10 GM/15ML PO SOLN
10.0000 g | Freq: Every day | ORAL | Status: DC | PRN
  Administered 2021-08-23: 10 g via ORAL
  Filled 2021-08-22: qty 15
  Filled 2021-08-22: qty 30

## 2021-08-22 MED ORDER — CARVEDILOL 3.125 MG PO TABS
6.2500 mg | ORAL_TABLET | Freq: Two times a day (BID) | ORAL | Status: DC
  Administered 2021-08-22: 6.25 mg via ORAL
  Filled 2021-08-22: qty 2

## 2021-08-22 MED ORDER — HYDROCORTISONE (PERIANAL) 2.5 % EX CREA
1.0000 "application " | TOPICAL_CREAM | Freq: Four times a day (QID) | CUTANEOUS | Status: DC | PRN
  Administered 2021-08-22 – 2021-08-23 (×2): 1 via TOPICAL
  Filled 2021-08-22 (×2): qty 28.35

## 2021-08-22 MED ORDER — PREDNISONE 20 MG PO TABS: 40.0000 mg | ORAL_TABLET | Freq: Every day | ORAL | Status: DC

## 2021-08-22 MED ORDER — DOXYCYCLINE HYCLATE 100 MG PO TABS
100.0000 mg | ORAL_TABLET | Freq: Once | ORAL | Status: AC
  Administered 2021-08-22: 100 mg via ORAL
  Filled 2021-08-22: qty 1

## 2021-08-22 MED ORDER — SODIUM CHLORIDE 0.9 % IV SOLN
1.0000 g | Freq: Once | INTRAVENOUS | Status: AC
  Administered 2021-08-22: 1 g via INTRAVENOUS
  Filled 2021-08-22: qty 10

## 2021-08-22 MED ORDER — UMECLIDINIUM BROMIDE 62.5 MCG/ACT IN AEPB
1.0000 | INHALATION_SPRAY | Freq: Every day | RESPIRATORY_TRACT | Status: DC
  Administered 2021-08-22 – 2021-08-25 (×4): 1 via RESPIRATORY_TRACT
  Filled 2021-08-22: qty 7

## 2021-08-22 MED ORDER — MIRTAZAPINE 15 MG PO TABS
15.0000 mg | ORAL_TABLET | Freq: Every day | ORAL | Status: DC
  Administered 2021-08-22 – 2021-08-24 (×3): 15 mg via ORAL
  Filled 2021-08-22 (×3): qty 1

## 2021-08-22 MED ORDER — INSULIN ASPART 100 UNIT/ML IJ SOLN
0.0000 [IU] | Freq: Every day | INTRAMUSCULAR | Status: DC
  Administered 2021-08-22: 3 [IU] via SUBCUTANEOUS

## 2021-08-22 MED ORDER — SODIUM CHLORIDE 0.9 % IV SOLN
500.0000 mg | INTRAVENOUS | Status: DC
  Administered 2021-08-23 – 2021-08-25 (×3): 500 mg via INTRAVENOUS
  Filled 2021-08-22 (×3): qty 5

## 2021-08-22 MED ORDER — FLUTICASONE FUROATE-VILANTEROL 100-25 MCG/ACT IN AEPB
1.0000 | INHALATION_SPRAY | Freq: Every day | RESPIRATORY_TRACT | Status: DC
  Administered 2021-08-22 – 2021-08-25 (×4): 1 via RESPIRATORY_TRACT
  Filled 2021-08-22: qty 28

## 2021-08-22 NOTE — ED Provider Notes (Signed)
[facility] EMERGENCY DEPARTMENT Provider Note   CSN: [id number] Arrival date & time: 08/22/21  1227     History Chief Complaint  Patient presents with   Chest Pain    Gregory Mitchell is a 81 y.o. male with history of CAD and COPD who presents with concern over 1 month of intermittent episodeOf waking in the night and each morning with left-sided chest tightness and "feeling like it is all locked up".  He states that he usually drinks a hot cup of coffee and approximately an hour after that he will begin to feel his chest relaxing.  He states he is constantly coughing up thick yellow mucus though this is not much increased from his baseline given his history of COPD.  He denies any fevers at home.  Does endorse some mild left-sided chest pain associated with this tightness but denies any radiation of the pain or exacerbation of the pain with exertion.  He endorses some racing of his heart whenever he is up and active.  I have personally reviewed this patient's medical records.  He is history of COPD, CHF secondary to nonischemic cardiomyopathy, CKD stage III, hypertension, and history of PE anticoagulated on Eliquis.  He denies any recent falls.  Patient also has also endorse the last few weeks have melena anticoagulated on Eliquis.  No history of GI bleed.  History EGD with Dr. Abbey Chatters with candidal esophagitis, scheduled to see gastroenterology on Monday for repeat EGD.  Most recent echocardiogram in July 2022 had LVEF of 30 to 35%.  He has not had a heart catheterization since January 2021 at which time he had no stents placed.  HPI     Home Medications Prior to Admission medications   Medication Sig Start Date End Date Taking? Authorizing Provider  albuterol (PROVENTIL) (5 MG/ML) 0.5% nebulizer solution Inhale into the lungs. 06/03/21 06/03/22 Yes [provider]  albuterol (VENTOLIN HFA) 108 (90 Base) MCG/ACT inhaler Inhale 2 puffs into the lungs every 6 (six) hours as needed  for wheezing or shortness of breath.    Yes [provider]  apixaban (ELIQUIS) 5 MG TABS tablet Take 5 mg by mouth 2 (two) times daily.   Yes [provider]  carvedilol (COREG) 6.25 MG tablet Take 1 tablet (6.25 mg total) by mouth 2 (two) times daily. 12/01/20  Yes Branch, Alphonse Guild, MD  dapagliflozin propanediol (FARXIGA) 10 MG TABS tablet Take 1 tablet (10 mg total) by mouth daily before breakfast. 10/30/20  Yes Clegg, Amy D, NP  diclofenac Sodium (VOLTAREN) 1 % GEL Apply 1 application topically 4 (four) times daily as needed (pain). 08/29/20  Yes [provider]  ENULOSE 10 GM/15ML SOLN Take by mouth daily as needed (constipation). 04/10/21  Yes [provider]  esomeprazole (NEXIUM) 40 MG capsule Take 1 capsule (40 mg total) by mouth 2 (two) times daily before a meal. 04/30/21 04/30/22 Yes Carver, Elon Alas, DO  guaiFENesin (ROBITUSSIN) 100 MG/5ML liquid Take by mouth. 07/10/21  Yes [provider]  HM SENNA-S 8.6-50 MG tablet Take 2 tablets by mouth daily. 07/10/21  Yes [provider]  HYDROcodone-acetaminophen (NORCO) 7.5-325 MG tablet Take 1 tablet by mouth 2 (two) times daily as needed. 11/27/20  Yes [provider]  ipratropium-albuterol (DUONEB) 0.5-2.5 (3) MG/3ML SOLN Take 3 mLs by nebulization every 6 (six) hours as needed (for shortness of breath/wheezing).    Yes [provider]  mirtazapine (REMERON) 15 MG tablet Take 15 mg by mouth at  bedtime.   Yes [provider]  OXYGEN Inhale 4 L into the lungs at bedtime as needed (for shortness of breath).   Yes [provider]  sacubitril-valsartan (ENTRESTO) 24-26 MG Take 1 tablet by mouth 2 (two) times daily.   Yes [provider]  SM SENNA LAXATIVE 8.6 MG tablet Take 2 tablets by mouth 2 (two) times daily. 11/27/20  Yes [provider]  tamsulosin (FLOMAX) 0.4 MG CAPS capsule Take 0.8 mg by mouth 2 (two) times daily.   Yes [provider]  Donnal Debar 200-62.5-25 MCG/ACT AEPB Take 1 puff by mouth daily. 07/27/21  Yes [provider]  torsemide (DEMADEX) 20 MG tablet Take 1 tablet (20 mg total) by mouth 2 (two) times daily. Patient not taking: Reported on 08/22/2021 03/10/21   Arnoldo Lenis, MD      Allergies    Amoxicillin, Ciprofloxacin, and Sulfa antibiotics    Review of Systems   Review of Systems  Constitutional: Negative.   HENT: Negative.    Respiratory:  Positive for choking, chest tightness and shortness of breath.   Cardiovascular:  Positive for chest pain and palpitations. Negative for leg swelling.  Gastrointestinal: Negative.   Genitourinary: Negative.   Musculoskeletal: Negative.   Neurological: Negative.   Hematological: Negative.    Physical Exam Updated Vital Signs BP 104/68    Pulse 99    Temp 98.7 F (37.1 C)    Resp 17    SpO2 98%  Physical Exam Vitals and nursing note reviewed. Exam conducted with a chaperone present.  Constitutional:      Appearance: He is not ill-appearing or toxic-appearing.  HENT:     Head: Normocephalic and atraumatic.     Nose: Nose normal.     Mouth/Throat:     Mouth: Mucous membranes are moist.     Pharynx: Oropharynx is clear. Uvula midline. No oropharyngeal exudate or posterior oropharyngeal erythema.     Tonsils: No tonsillar exudate.  Eyes:     General: Lids are normal. Vision grossly intact.        Right eye: No discharge.        Left eye: No discharge.     Extraocular Movements: Extraocular movements intact.     Conjunctiva/sclera: Conjunctivae normal.     Pupils: Pupils are equal, round, and reactive to light.  Neck:     Trachea: Trachea and phonation normal.  Cardiovascular:     Rate and Rhythm: Normal rate and regular rhythm.     Pulses: Normal pulses.     Heart sounds: Normal heart sounds. No murmur heard. Pulmonary:     Effort: Pulmonary effort is normal. No tachypnea, bradypnea, accessory muscle usage, prolonged  expiration or respiratory distress.     Breath sounds: Decreased air movement present. Examination of the left-middle field reveals decreased breath sounds. Decreased breath sounds present. No wheezing or rales.  Chest:     Chest wall: No mass, lacerations, deformity, swelling, tenderness, crepitus or edema.  Abdominal:     General: Bowel sounds are normal. There is no distension.     Palpations: Abdomen is soft.     Tenderness: There is no abdominal tenderness. There is no right CVA tenderness, left CVA tenderness, guarding or rebound.  Genitourinary:    Rectum: Guaiac result positive. External hemorrhoid present. No mass or anal fissure. Normal anal tone.  Musculoskeletal:        General: No deformity.     Cervical back: Normal range of motion  and neck supple. No edema, rigidity or crepitus. No pain with movement, spinous process tenderness or muscular tenderness.     Right lower leg: No tenderness. 2+ Pitting Edema present.     Left lower leg: 2+ Pitting Edema present.  Lymphadenopathy:     Cervical: No cervical adenopathy.  Skin:    General: Skin is warm and dry.     Capillary Refill: Capillary refill takes less than 2 seconds.  Neurological:     General: No focal deficit present.     Mental Status: He is alert. Mental status is at baseline.     GCS: GCS eye subscore is 4. GCS verbal subscore is 5. GCS motor subscore is 6.     Gait: Gait is intact.  Psychiatric:        Mood and Affect: Mood normal.    ED Results / Procedures / Treatments   Labs (all labs ordered are listed, but only abnormal results are displayed) Labs Reviewed  BASIC METABOLIC PANEL - Abnormal; Notable for the following components:      Result Value   Chloride 112 (*)    BUN 26 (*)    Creatinine, Ser 1.29 (*)    Calcium 7.8 (*)    GFR, Estimated 56 (*)    Anion gap 3 (*)    All other components within normal limits  CBC - Abnormal; Notable for the following components:   RBC 2.86 (*)    Hemoglobin 8.2  (*)    HCT 28.9 (*)    MCV 101.0 (*)    MCHC 28.4 (*)    RDW 18.0 (*)    All other components within normal limits  BRAIN NATRIURETIC PEPTIDE - Abnormal; Notable for the following components:   B Natriuretic Peptide 639.0 (*)    All other components within normal limits  BLOOD GAS, VENOUS - Abnormal; Notable for the following components:   pO2, Ven <31.0 (*)    All other components within normal limits  TROPONIN I (HIGH SENSITIVITY) - Abnormal; Notable for the following components:   Troponin I (High Sensitivity) 34 (*)    All other components within normal limits  PROTIME-INR  TROPONIN I (HIGH SENSITIVITY)    EKG EKG Interpretation  Date/Time:  Saturday August 22 2021 12:42:08 EST Ventricular Rate:  106 PR Interval:  209 QRS Duration: 135 QT Interval:  351 QTC Calculation: 467 R Axis:   -77 Text Interpretation: Sinus or ectopic atrial tachycardia Borderline prolonged PR interval RBBB and LAFB Nonspecific T abnormalities, lateral leads rate is faster compared to April 2022 Confirmed by Sherwood Gambler [phone]) on 08/22/2021 12:52:10 PM  Radiology DG Chest 2 View  Result Date: 08/22/2021 CLINICAL DATA:  Pt c/o SOB and chest pain for several days, pt also c/o cough. Hx of asthma, COPD, and HTN. EXAM: CHEST - 2 VIEW COMPARISON:  None. FINDINGS: Cardiac silhouette is normal in size. No mediastinal or hilar masses. No evidence of adenopathy. There is streaky opacity at both medial lung bases that has the appearance of bronchial wall thickening and peribronchovascular opacities as well as interstitial thickening. This is greater on the left, and is similar to recent prior chest radiographs as well as a chest CT from 07/28/2021. Remainder of the lungs is clear. Trace pleural effusions.  No pneumothorax. Skeletal structures are grossly intact. IMPRESSION: 1. Left greater than right lung base opacities similar to recent prior studies. Suspect chronic bronchitis and more acute  bronchopneumonia. Electronically Signed   By: Dedra Skeens.D.  On: 08/22/2021 13:31    Procedures Procedures    Medications Ordered in ED Medications  cefTRIAXone (ROCEPHIN) 1 g in sodium chloride 0.9 % 100 mL IVPB (has no administration in time range)  ipratropium-albuterol (DUONEB) 0.5-2.5 (3) MG/3ML nebulizer solution 3 mL (3 mLs Nebulization Given 08/22/21 1400)  doxycycline (VIBRA-TABS) tablet 100 mg (100 mg Oral Given 08/22/21 1425)    ED Course/ Medical Decision Making/ A&P Clinical Course as of 08/23/21 0002  Sat Aug 22, 2021  1642 Consult to Dr. Abbey Chatters, GI who recommends admission to the hospital for trending of hemoglobin and likely scope inpatient. He will plan to see the patient in the morning.  [RS]  1712 Consult to hospitalist Dr. Nehemiah Settle who is agreeable to seeing this patient and admitting him to his service. I appreciate his collaboration in the care of this patient.  [RS]    Clinical Course User Index [RS] Aleigh Grunden, Gypsy Balsam, PA-C                           Medical Decision Making This patient presents to the ED for concern of shortness of breath and left-sided chest discomfort for the last several days, this involves an extensive number of treatment options, and is a complaint that carries with it a high risk of complications and morbidity.  The differential diagnosis includes but is not limited to ACS, PE, pleural effusion, CHF, pneumonia.  Patient mildly tachycardic to 104, vital signs otherwise normal on intake.  Significantly decreased air movement throughout the lung fields bilaterally with coarse breath sounds in the left midlung.  Abdominal exam is benign.  Some lower extremity edema bilaterally, per patient at baseline, pitting.  Comorbidities that complicate the patient evaluation: Nonischemic cardiomyopathy CKD stage III COPD Hypertension Type 2 diabetes History of PE anticoagulated on Eliquis   Additional history obtained:  Additional history  obtained from son at the bedside External records from outside source obtained and reviewed including everywhere   Lab Tests:  I Ordered, reviewed, and interpreted labs.  The pertinent results include: CBC without cytosis but with new anemia with hemoglobin of 8.2, baseline at 10.  Given new anemia in context of melena Hemoccult was obtained which was positive.  BMP with creatinine of 1.29 near patient's baseline.  BNP with elevation to 639 improved from prior.  Troponin mildly elevated to 34 near patient's baseline.  Respiratory pathogen panel pending.   Imaging Studies ordered:  I ordered imaging studies including chest x-ray   I independently visualized and interpreted imaging which showed left-sided bronchopneumonia I agree with the radiologist interpretation   Cardiac Monitoring:  The patient was maintained on a cardiac monitor.  I personally viewed and interpreted the cardiac monitored which showed an underlying rhythm of: Normal sinus rhythm   Medicines ordered and prescription drug management:  I ordered medication including antibiotics for pneumonia.  DuoNeb and steroids for possible concurrent COPD exacerbation.  Protonix for upper GI bleed,. Reevaluation of the patient after these medicines showed that the patient improved I have reviewed the patients home medicines and have made adjustments as needed  Tests Considered: CT of the abdomen pelvis given reported constipation, however rectal exam is reassuring without mass or internal tenderness palpation and ultimately was deferred at this time   Critical Interventions:  Antibiotics, DuoNeb 4 increased work of breathing   Consultations Obtained:  I requested consultation with the gastroenterologist and hospitalist as above,  and discussed lab and imaging findings  as well as pertinent plan - they recommend: Admission to the hospital for trending of hemoglobin, treatment of pneumonia, and likely endoscopy by  gastroenterology during admission   Problem List / ED Course: Shortness of breath: Ultimately diagnosed as left-sided bronchopneumonia, treated with antibiotics, steroids, and DuoNeb with improvement GI bleed: Type and screen pending, Protonix ordered.  Gastroenterology aware and will follow patient in the patient setting    Reevaluation:  After the interventions noted above, I reevaluated the patient and found that they have :improved  Social Determinants of Health Elderly, lives alone  Dispostion:  After consideration of the diagnostic results and the patients response to treatment feel that the patent would benefit from admission to the hospital for trending of hemoglobin and further stabilization.   Herman voiced understanding of medical evaluation and treatment plan.  Each of his questions was answered to his expressed satisfaction.  He is amenable plan for admission at this time.  This chart was dictated using voice recognition software, Dragon. Despite the best efforts of this provider to proofread and correct errors, errors may still occur which can change documentation meaning.     Final Clinical Impression(s) / ED Diagnoses Final diagnoses:  None    Rx / DC Orders ED Discharge Orders     None         Emeline Darling, PA-C 08/23/21 0007    Sherwood Gambler, MD 08/23/21 0730

## 2021-08-22 NOTE — ED Notes (Signed)
Pt reports his breathing feels much better since he had the breathing treatment.

## 2021-08-22 NOTE — Progress Notes (Signed)
Pt arrived from ED via stretcher. Pt ambulated from stretcher to bed with standby assist and use of cane, steady on feet. Pt to restroom, voided small amount of clear yellow urine. Pt c/o severe 10/10 pain in his rectal area, states started when he had BM in ED. B/P WNL, HR elevated 111/min, SaO2 98% on room air. Pt placed on tele, oriented to room and safety procedures, states understanding.

## 2021-08-22 NOTE — ED Notes (Signed)
Pt ambulated in hallway per PA request. After apprx 20 ft pt reports he felt "winded." Lowest o2 sat was 96% on RA however HR increased to 120

## 2021-08-22 NOTE — H&P (Signed)
History and Physical  Gregory Mitchell GGE:[id number] DOB: July 22, 1941 DOA: 08/22/2021  Referring physician: Silverio Decamp, PA-C, EDP PCP: Neale Burly, MD  Outpatient Specialists:   Patient Coming From: home  Chief Complaint: cough, rectal bleeding  HPI: Gregory Mitchell is a 81 y.o. male with a history of combined systolic and diastolic heart failure, hypertension, history of pulmonary embolism, type 2 diabetes, chronic kidney disease, COPD.  Patient seen for increasing shortness of breath, chest pain, production of yellow thick mucus increased from baseline COPD.  Pain worse with inspiration.  He does feel somewhat wheezy.  He has been having increased melena over the past couple of weeks.  No history of frank GI bleed.  No palliating or provoking factors.  Emergency Department Course: Hemoglobin 8.2 from a baseline of 10.3.  Creatinine 1.29, which is approximately his baseline.  White count was normal.  Troponin stable.  BNP 639.  Review of Systems:   Pt denies any fevers, chills, nausea, vomiting, diarrhea, constipation, abdominal pain, palpitations, headache, vision changes, lightheadedness, dizziness, rectal bleeding.  Review of systems are otherwise negative  Past Medical History:  Diagnosis Date   Asthma    Chronic renal insufficiency    COPD (chronic obstructive pulmonary disease) (HCC)    Heart failure (HCC)    Acute on chronic combined systolic and diastolic heart failure   Hypertension    Pulmonary emboli ([place]) 03/2019   Renal insufficiency    Type 2 diabetes mellitus ([place])    Past Surgical History:  Procedure Laterality Date   APPENDECTOMY     BALLOON DILATION N/A 03/18/2020   Procedure: BALLOON DILATION;  Surgeon: Eloise Harman, DO;  [facility];  Service: Endoscopy;  Laterality: N/A;   CHOLECYSTECTOMY     COLONOSCOPY  10/2006   Dr.Anwar:normal   ESOPHAGOGASTRODUODENOSCOPY (EGD) WITH PROPOFOL N/A 03/18/2020   Procedure:  ESOPHAGOGASTRODUODENOSCOPY (EGD) WITH PROPOFOL;  Surgeon: Eloise Harman, DO;  Location: [facility];  Service: Endoscopy;  Laterality: N/A;  2:30pm   NASAL SINUS SURGERY     NEPHRECTOMY     RIGHT (Question CA.  No further therapy needed.)   RIGHT/LEFT HEART CATH AND CORONARY ANGIOGRAPHY N/A 09/03/2019   Procedure: RIGHT/LEFT HEART CATH AND CORONARY ANGIOGRAPHY;  Surgeon: Lorretta Harp, MD;  Location: [facility];  Service: Cardiovascular;  Laterality: N/A;   Social History:  reports that he quit smoking about 45 years ago. His smoking use included cigarettes. He started smoking about 64 years ago. He has a 15.00 pack-year smoking history. He quit smokeless tobacco use about 15 years ago.  His smokeless tobacco use included chew. He reports that he does not currently use alcohol. He reports that he does not use drugs. Patient lives at home  Allergies  Allergen Reactions   Amoxicillin Other (See Comments)    Makes eyes turn yellow and burn   Ciprofloxacin     Pt not sure   Sulfa Antibiotics Rash    Pt not sure of reaction    Family History  Problem Relation Age of Onset   Lung cancer Brother    Stroke Mother    Breast cancer Mother    Colon cancer Neg Hx       Prior to Admission medications   Medication Sig Start Date End Date Taking? Authorizing Provider  albuterol (PROVENTIL) (5 MG/ML) 0.5% nebulizer solution Inhale into the lungs. 06/03/21 06/03/22 Yes [provider]  albuterol (VENTOLIN HFA) 108 (90 Base) MCG/ACT inhaler Inhale  2 puffs into the lungs every 6 (six) hours as needed for wheezing or shortness of breath.    Yes [provider]  apixaban (ELIQUIS) 5 MG TABS tablet Take 5 mg by mouth 2 (two) times daily.   Yes [provider]  carvedilol (COREG) 6.25 MG tablet Take 1 tablet (6.25 mg total) by mouth 2 (two) times daily. 12/01/20  Yes Branch, Alphonse Guild, MD  dapagliflozin propanediol (FARXIGA) 10 MG TABS tablet Take 1 tablet (10  mg total) by mouth daily before breakfast. 10/30/20  Yes Clegg, Amy D, NP  diclofenac Sodium (VOLTAREN) 1 % GEL Apply 1 application topically 4 (four) times daily as needed (pain). 08/29/20  Yes [provider]  ENULOSE 10 GM/15ML SOLN Take by mouth daily as needed (constipation). 04/10/21  Yes [provider]  esomeprazole (NEXIUM) 40 MG capsule Take 1 capsule (40 mg total) by mouth 2 (two) times daily before a meal. 04/30/21 04/30/22 Yes Carver, Elon Alas, DO  guaiFENesin (ROBITUSSIN) 100 MG/5ML liquid Take by mouth. 07/10/21  Yes [provider]  HM SENNA-S 8.6-50 MG tablet Take 2 tablets by mouth daily. 07/10/21  Yes [provider]  HYDROcodone-acetaminophen (NORCO) 7.5-325 MG tablet Take 1 tablet by mouth 2 (two) times daily as needed. 11/27/20  Yes [provider]  ipratropium-albuterol (DUONEB) 0.5-2.5 (3) MG/3ML SOLN Take 3 mLs by nebulization every 6 (six) hours as needed (for shortness of breath/wheezing).    Yes [provider]  mirtazapine (REMERON) 15 MG tablet Take 15 mg by mouth at bedtime.   Yes [provider]  OXYGEN Inhale 4 L into the lungs at bedtime as needed (for shortness of breath).   Yes [provider]  sacubitril-valsartan (ENTRESTO) 24-26 MG Take 1 tablet by mouth 2 (two) times daily.   Yes [provider]  SM SENNA LAXATIVE 8.6 MG tablet Take 2 tablets by mouth 2 (two) times daily. 11/27/20  Yes [provider]  tamsulosin (FLOMAX) 0.4 MG CAPS capsule Take 0.8 mg by mouth 2 (two) times daily.   Yes [provider]  Donnal Debar 200-62.5-25 MCG/ACT AEPB Take 1 puff by mouth daily. 07/27/21  Yes [provider]  torsemide (DEMADEX) 20 MG tablet Take 1 tablet (20 mg total) by mouth 2 (two) times daily. Patient not taking: Reported on 08/22/2021 03/10/21   Arnoldo Lenis, MD    Physical Exam: BP 128/80    Pulse (!) 113    Temp 98.7 F (37.1 C)    Resp (!) 22    SpO2 97%    General: Elderly male. Awake and alert and oriented x3. No acute cardiopulmonary distress.  HEENT: Normocephalic atraumatic.  Right and left ears normal in appearance.  Pupils equal, round, reactive to light. Extraocular muscles are intact. Sclerae anicteric and noninjected.  Moist mucosal membranes. No mucosal lesions.  Neck: Neck supple without lymphadenopathy. No carotid bruits. No masses palpated.  Cardiovascular: Regular rate with normal S1-S2 sounds. No murmurs, rubs, gallops auscultated. No JVD.  Respiratory: Diffuse wheezing.  Left sided rales.  No accessory muscle use. Abdomen: Soft, nontender, nondistended. Active bowel sounds. No masses or hepatosplenomegaly  Skin: No rashes, lesions, or ulcerations.  Dry, warm to touch. 2+ dorsalis pedis and radial pulses. Musculoskeletal: No calf or leg pain. All major joints not erythematous nontender.  No upper or lower joint deformation.  Good ROM.  No contractures  Psychiatric: Intact judgment and insight. Pleasant and cooperative. Neurologic: No focal neurological deficits. Strength is 5/5  and symmetric in upper and lower extremities.  Cranial nerves II through XII are grossly intact.           Labs on Admission: I have personally reviewed following labs and imaging studies  CBC: Recent Labs  Lab 08/22/21 1309  WBC 9.7  HGB 8.2*  HCT 28.9*  MCV 101.0*  PLT 128   Basic Metabolic Panel: Recent Labs  Lab 08/22/21 1309  NA 141  K 3.8  CL 112*  CO2 26  GLUCOSE 94  BUN 26*  CREATININE 1.29*  CALCIUM 7.8*   GFR: CrCl cannot be calculated (Unknown ideal weight.). Liver Function Tests: No results for input(s): AST, ALT, ALKPHOS, BILITOT, PROT, ALBUMIN in the last 168 hours. No results for input(s): LIPASE, AMYLASE in the last 168 hours. No results for input(s): AMMONIA in the last 168 hours. Coagulation Profile: Recent Labs  Lab 08/22/21 1309  INR 1.0   Cardiac Enzymes: No results for input(s): CKTOTAL, CKMB,  CKMBINDEX, TROPONINI in the last 168 hours. BNP (last 3 results) No results for input(s): PROBNP in the last 8760 hours. HbA1C: No results for input(s): HGBA1C in the last 72 hours. CBG: No results for input(s): GLUCAP in the last 168 hours. Lipid Profile: No results for input(s): CHOL, HDL, LDLCALC, TRIG, CHOLHDL, LDLDIRECT in the last 72 hours. Thyroid Function Tests: No results for input(s): TSH, T4TOTAL, FREET4, T3FREE, THYROIDAB in the last 72 hours. Anemia Panel: No results for input(s): VITAMINB12, FOLATE, FERRITIN, TIBC, IRON, RETICCTPCT in the last 72 hours. Urine analysis:    Component Value Date/Time   COLORURINE STRAW (A) 11/15/2020 1909   APPEARANCEUR CLEAR 11/15/2020 1909   LABSPEC 1.008 11/15/2020 1909   PHURINE 6.0 11/15/2020 1909   GLUCOSEU 50 (A) 11/15/2020 [place] NEGATIVE 11/15/2020 [place] NEGATIVE 11/15/2020 [place] NEGATIVE 11/15/2020 1909   PROTEINUR NEGATIVE 11/15/2020 1909   NITRITE NEGATIVE 11/15/2020 1909   LEUKOCYTESUR NEGATIVE 11/15/2020 1909   Sepsis Labs: @LABRCNTIP (procalcitonin:4,lacticidven:4) )No results found for this or any previous visit (from the past 240 hour(s)).   Radiological Exams on Admission: DG Chest 2 View  Result Date: 08/22/2021 CLINICAL DATA:  Pt c/o SOB and chest pain for several days, pt also c/o cough. Hx of asthma, COPD, and HTN. EXAM: CHEST - 2 VIEW COMPARISON:  None. FINDINGS: Cardiac silhouette is normal in size. No mediastinal or hilar masses. No evidence of adenopathy. There is streaky opacity at both medial lung bases that has the appearance of bronchial wall thickening and peribronchovascular opacities as well as interstitial thickening. This is greater on the left, and is similar to recent prior chest radiographs as well as a chest CT from 07/28/2021. Remainder of the lungs is clear. Trace pleural effusions.  No pneumothorax. Skeletal structures are grossly intact. IMPRESSION: 1. Left greater  than right lung base opacities similar to recent prior studies. Suspect chronic bronchitis and more acute bronchopneumonia. Electronically Signed   By: Lajean Manes M.D.   On: 08/22/2021 13:31    EKG: Independently reviewed.  Sinus tachycardia.  Slightly prolonged PR interval.  Right bundle blanch block and left anterior fascicular block.  No acute ST changes.  Assessment/Plan: Principal Problem:   Symptomatic anemia Active Problems:   Type 2 diabetes mellitus (HCC)   HTN (hypertension)   Chronic kidney disease, stage 3a (HCC)   Chronic combined systolic and diastolic CHF (congestive heart failure) (HCC)   COPD with acute exacerbation (HCC)   History of pulmonary embolism  Upper GI bleed    This patient was discussed with the ED physician, including pertinent vitals, physical exam findings, labs, and imaging.  We also discussed care given by the ED provider.  Symptomatic anemia Check CBC every 6 hours. Transfuse 1 unit Discussed risks of transfusion including transfusion reaction, infection, etc. Upper GI bleed GI consulted On clear liquid diet As patient not unstable, will not acutely reverse History of PE on anticoagulation On chronic anticoagulation, which we will hold Community-acquired pneumonia with acute COPD exacerbation Antibiotics: Rocephin and azithromycin DuoNeb's every 6 scheduled with albuterol every 2 when necessary Continue inhaled steroids and LA bronchodilator Prednisone 40 mg daily Mucinex Robitussin Blood cultures drawn in the emergency department Sputum cultures CBC tomorrow Strep and Legionella antigen by urine Influenza screen and COVID screen negative Combined systolic and diastolic heart failure Compensated Type 2 diabetes Currently diet-controlled. Sliding scale insulin and CBGs before meals and nightly Hypertension Continue home medications  DVT prophylaxis: SCDs Consultants: GI Code Status: Full code Family Communication:  None Disposition Plan: Patient to return home following admission   Truett Mainland, DO

## 2021-08-22 NOTE — ED Triage Notes (Signed)
Chest pain for several days

## 2021-08-22 NOTE — ED Notes (Signed)
RT notified for neb  

## 2021-08-23 ENCOUNTER — Encounter (HOSPITAL_COMMUNITY): Payer: Self-pay | Admitting: Family Medicine

## 2021-08-23 ENCOUNTER — Other Ambulatory Visit: Payer: Self-pay

## 2021-08-23 DIAGNOSIS — N1831 Chronic kidney disease, stage 3a: Secondary | ICD-10-CM

## 2021-08-23 DIAGNOSIS — D649 Anemia, unspecified: Secondary | ICD-10-CM

## 2021-08-23 DIAGNOSIS — K922 Gastrointestinal hemorrhage, unspecified: Secondary | ICD-10-CM

## 2021-08-23 DIAGNOSIS — J189 Pneumonia, unspecified organism: Secondary | ICD-10-CM

## 2021-08-23 DIAGNOSIS — Z86711 Personal history of pulmonary embolism: Secondary | ICD-10-CM

## 2021-08-23 DIAGNOSIS — R1319 Other dysphagia: Secondary | ICD-10-CM

## 2021-08-23 DIAGNOSIS — I10 Essential (primary) hypertension: Secondary | ICD-10-CM

## 2021-08-23 DIAGNOSIS — I5042 Chronic combined systolic (congestive) and diastolic (congestive) heart failure: Secondary | ICD-10-CM

## 2021-08-23 LAB — CBC
HCT: 26.7 % — ABNORMAL LOW (ref 39.0–52.0)
HCT: 28.5 % — ABNORMAL LOW (ref 39.0–52.0)
HCT: 29.5 % — ABNORMAL LOW (ref 39.0–52.0)
HCT: 31.2 % — ABNORMAL LOW (ref 39.0–52.0)
Hemoglobin: 7.8 g/dL — ABNORMAL LOW (ref 13.0–17.0)
Hemoglobin: 8.3 g/dL — ABNORMAL LOW (ref 13.0–17.0)
Hemoglobin: 8.4 g/dL — ABNORMAL LOW (ref 13.0–17.0)
Hemoglobin: 8.5 g/dL — ABNORMAL LOW (ref 13.0–17.0)
MCH: 27.8 pg (ref 26.0–34.0)
MCH: 29.2 pg (ref 26.0–34.0)
MCH: 29.2 pg (ref 26.0–34.0)
MCH: 29.3 pg (ref 26.0–34.0)
MCHC: 26.9 g/dL — ABNORMAL LOW (ref 30.0–36.0)
MCHC: 28.8 g/dL — ABNORMAL LOW (ref 30.0–36.0)
MCHC: 29.1 g/dL — ABNORMAL LOW (ref 30.0–36.0)
MCHC: 29.2 g/dL — ABNORMAL LOW (ref 30.0–36.0)
MCV: 100 fL (ref 80.0–100.0)
MCV: 100.4 fL — ABNORMAL HIGH (ref 80.0–100.0)
MCV: 101.7 fL — ABNORMAL HIGH (ref 80.0–100.0)
MCV: 103.3 fL — ABNORMAL HIGH (ref 80.0–100.0)
Platelets: 356 10*3/uL (ref 150–400)
Platelets: 381 10*3/uL (ref 150–400)
Platelets: 382 10*3/uL (ref 150–400)
Platelets: 407 10*3/uL — ABNORMAL HIGH (ref 150–400)
RBC: 2.67 MIL/uL — ABNORMAL LOW (ref 4.22–5.81)
RBC: 2.84 MIL/uL — ABNORMAL LOW (ref 4.22–5.81)
RBC: 2.9 MIL/uL — ABNORMAL LOW (ref 4.22–5.81)
RBC: 3.02 MIL/uL — ABNORMAL LOW (ref 4.22–5.81)
RDW: 17.8 % — ABNORMAL HIGH (ref 11.5–15.5)
RDW: 17.9 % — ABNORMAL HIGH (ref 11.5–15.5)
RDW: 17.9 % — ABNORMAL HIGH (ref 11.5–15.5)
RDW: 17.9 % — ABNORMAL HIGH (ref 11.5–15.5)
WBC: 10 10*3/uL (ref 4.0–10.5)
WBC: 10.4 10*3/uL (ref 4.0–10.5)
WBC: 7.2 10*3/uL (ref 4.0–10.5)
WBC: 9 10*3/uL (ref 4.0–10.5)
nRBC: 0 % (ref 0.0–0.2)
nRBC: 0 % (ref 0.0–0.2)
nRBC: 0 % (ref 0.0–0.2)
nRBC: 0 % (ref 0.0–0.2)

## 2021-08-23 LAB — GLUCOSE, CAPILLARY
Glucose-Capillary: 121 mg/dL — ABNORMAL HIGH (ref 70–99)
Glucose-Capillary: 67 mg/dL — ABNORMAL LOW (ref 70–99)
Glucose-Capillary: 191 mg/dL — ABNORMAL HIGH (ref 70–99)
Glucose-Capillary: 145 mg/dL — ABNORMAL HIGH (ref 70–99)
Glucose-Capillary: 138 mg/dL — ABNORMAL HIGH (ref 70–99)

## 2021-08-23 LAB — BASIC METABOLIC PANEL
Anion gap: 5 (ref 5–15)
BUN: 26 mg/dL — ABNORMAL HIGH (ref 8–23)
CO2: 25 mmol/L (ref 22–32)
Calcium: 8 mg/dL — ABNORMAL LOW (ref 8.9–10.3)
Chloride: 111 mmol/L (ref 98–111)
Creatinine, Ser: 1.11 mg/dL (ref 0.61–1.24)
GFR, Estimated: >60 mL/min (ref >60)
Glucose, Bld: 181 mg/dL — ABNORMAL HIGH (ref 70–99)
Potassium: 4.5 mmol/L (ref 3.5–5.1)
Sodium: 141 mmol/L (ref 135–145)

## 2021-08-23 LAB — PROTIME-INR
INR: 1.1 (ref 0.8–1.2)
Prothrombin Time: 14 seconds (ref 11.4–15.2)

## 2021-08-23 LAB — HEMOGLOBIN A1C
Hgb A1c MFr Bld: 6.1 % — ABNORMAL HIGH (ref 4.8–5.6)
Mean Plasma Glucose: 128.37 mg/dL

## 2021-08-23 MED ORDER — DAPAGLIFLOZIN PROPANEDIOL 10 MG PO TABS
10.0000 mg | ORAL_TABLET | Freq: Every day | ORAL | Status: DC
  Administered 2021-08-23 – 2021-08-25 (×3): 10 mg via ORAL
  Filled 2021-08-23 (×4): qty 1

## 2021-08-23 MED ORDER — PREDNISONE 20 MG PO TABS
20.0000 mg | ORAL_TABLET | Freq: Every day | ORAL | Status: DC
  Administered 2021-08-23 – 2021-08-25 (×3): 20 mg via ORAL
  Filled 2021-08-23 (×3): qty 1

## 2021-08-23 MED ORDER — IPRATROPIUM-ALBUTEROL 0.5-2.5 (3) MG/3ML IN SOLN
3.0000 mL | RESPIRATORY_TRACT | Status: DC
  Administered 2021-08-23: 3 mL via RESPIRATORY_TRACT
  Filled 2021-08-23: qty 3

## 2021-08-23 MED ORDER — CARVEDILOL 3.125 MG PO TABS
6.2500 mg | ORAL_TABLET | Freq: Two times a day (BID) | ORAL | Status: DC
  Administered 2021-08-23 – 2021-08-25 (×5): 6.25 mg via ORAL
  Filled 2021-08-23 (×5): qty 2

## 2021-08-23 MED ORDER — IPRATROPIUM-ALBUTEROL 0.5-2.5 (3) MG/3ML IN SOLN
3.0000 mL | Freq: Four times a day (QID) | RESPIRATORY_TRACT | Status: DC
  Administered 2021-08-23 – 2021-08-24 (×3): 3 mL via RESPIRATORY_TRACT
  Filled 2021-08-23 (×3): qty 3

## 2021-08-23 MED ORDER — ALBUTEROL SULFATE (2.5 MG/3ML) 0.083% IN NEBU: 3.0000 mL | INHALATION_SOLUTION | Freq: Four times a day (QID) | RESPIRATORY_TRACT | Status: DC | PRN

## 2021-08-23 MED ORDER — SODIUM CHLORIDE 0.9 % IV SOLN
1.0000 g | INTRAVENOUS | Status: DC
  Administered 2021-08-23: 1 g via INTRAVENOUS
  Filled 2021-08-23: qty 10

## 2021-08-23 NOTE — TOC Initial Note (Signed)
Transition of Care [facility]) - Initial/Assessment Note    Patient Details  Name: Gregory Mitchell MRN: [id number] Date of Birth: May 05, 1941  Transition of Care [facility]) CM/SW Contact:    Kerin Salen, RN Phone Number: 08/23/2021, 4:26 PM  Clinical Narrative: TOCRN spoke with patient, who reports that his daughter and son lives with him and assist with cooking, shopping, also his sister picks up medications from the [facility]. Patient says he uses Cane at home and with ambulation outside the home. Patient reports independence with ADL's, however do receive HHS last serviced on Friday, did not remember name of Agency. Uses Oxygen, according to chart 4L, [place] service. Reviewed chart [facility] provides wound care. Patient reports he has everything he need at home. TOC to continue to track and assist as needed.             Expected Discharge Plan: [place] Barriers to Discharge: Continued Medical Work up   Patient Goals and CMS Choice Patient states their goals for this hospitalization and ongoing recovery are:: To return home      Expected Discharge Plan and Services Expected Discharge Plan: [place]       Living arrangements for the past 2 months: Single Family Home                                      Prior Living Arrangements/Services Living arrangements for the past 2 months: Single Family Home Lives with:: Adult Children Patient language and need for interpreter reviewed:: Yes Do you feel safe going back to the place where you live?: Yes      Need for Family Participation in Patient Care: Yes (Comment) Care giver support system in place?: Yes (comment) Current home services: Home RN Criminal Activity/Legal Involvement Pertinent to Current Situation/Hospitalization: No - Comment as needed  Activities of Daily Living Home Assistive Devices/Equipment: Cane (specify quad or straight) ADL Screening (condition at time of  admission) Patient's cognitive ability adequate to safely complete daily activities?: Yes Is the patient deaf or have difficulty hearing?: No Does the patient have difficulty seeing, even when wearing glasses/contacts?: No Does the patient have difficulty concentrating, remembering, or making decisions?: No Patient able to express need for assistance with ADLs?: No Does the patient have difficulty dressing or bathing?: No Independently performs ADLs?: Yes (appropriate for developmental age) Does the patient have difficulty walking or climbing stairs?: No Weakness of Legs: None Weakness of Arms/Hands: None  Permission Sought/Granted                  Emotional Assessment Appearance:: Appears stated age Attitude/Demeanor/Rapport: Guarded Affect (typically observed): Appropriate Orientation: : Oriented to Self, Oriented to Place, Oriented to  Time, Oriented to Situation Alcohol / Substance Use: Not Applicable Psych Involvement: No (comment)  Admission diagnosis:  Symptomatic anemia [D64.9] Patient Active Problem List   Diagnosis Date Noted   Symptomatic anemia 08/22/2021   Gastrointestinal hemorrhage 08/22/2021   Bacterial pneumonia 10/14/2020   History of pulmonary embolism 10/14/2020   Elevated troponin 10/14/2020   Community acquired pneumonia 10/14/2020   Pressure injury of skin 10/14/2020   Chronic kidney disease, stage 3a ([place]) 10/05/2020   Chronic combined systolic and diastolic CHF (congestive heart failure) ([place]) 10/05/2020   COPD with acute exacerbation ([place]) 10/05/2020   COVID-19 virus infection 10/04/2020   Acid reflux 06/12/2020   Candidal esophagitis (  [place]) 05/21/2020   Esophageal dysphagia 02/20/2020   Abdominal pain, epigastric 02/20/2020   Abnormal CT of liver 02/20/2020   AKI (acute kidney injury) ([place]) 09/04/2019   Acute on chronic systolic CHF (congestive heart failure) ([place]) 09/04/2019   Trifascicular block 09/04/2019   Acute on chronic respiratory  failure with hypoxia ([place]) 08/31/2019   Acute on chronic diastolic CHF (congestive heart failure) ([place]) 08/31/2019   COPD (chronic obstructive pulmonary disease) ([place]) 12/21/2013   Bronchiectasis ([place]) 12/21/2013   Dyspnea on exertion 08/23/2012   HTN (hypertension) 08/23/2012   Fever 02/11/2012   Obesity 01/11/2012   Chest pain 11/29/2011   Cardiomyopathy, secondary ([place]) 11/29/2011   Chronic renal insufficiency    Type 2 diabetes mellitus ([place])    PCP:  Neale Burly, MD Pharmacy:   [facility] [place] [address] [place] [address] Phone: [phone] Fax: [phone]     Social Determinants of Health (SDOH) Interventions    Readmission Risk Interventions Readmission Risk Prevention Plan 08/23/2021 10/06/2020  Transportation Screening Complete Complete  PCP or Specialist Appt within 5-7 Days - Complete  PCP or Specialist Appt within 3-5 Days Complete -  Home Care Screening - Complete  Medication Review (RN CM) - Complete  HRI or Home Care Consult Complete -  Social Work Consult for Recovery Care Planning/Counseling Complete -  Palliative Care Screening Not Applicable -  Medication Review (RN Care Manager) Complete -  Some recent data might be hidden

## 2021-08-23 NOTE — Progress Notes (Signed)
[facility] PROGRESS NOTE    Progress Note  Gregory Mitchell  FFM:[id number] DOB: [birth year] DOA: 08/22/2021 PCP: Neale Burly, MD     Brief Narrative:   Gregory Mitchell is an 81 y.o. male past medical history of combined systolic and diastolic heart failure, essential hypertension pulmonary embolism on apixaban [place] mellitus type II, chronic kidney disease stage IIIb comes in for shortness of breath, productive thick sputum from baseline.  He relates his pain is worse with inspiration has been having melanotic stools for the last couple of weeks.  Hemoglobin in the ED is 8.2 (baseline 10.3), creatinine 1.2    Assessment/Plan:   Acute blood loss anemia/symptomatic anemia/acute GI bleed: CBCs every 12, he status post 1 unit of packed red blood cells.  His hemoglobin this morning is 8.5. Currently on clear liquid diet. Patient is currently stable. GI has been consulted and awaiting further recommendations. Hold apixaban.  Place SCDs.  Acute COPD exacerbation versus possible community-acquired pneumonia: Has no fever, just of productive cough, with some shortness of breath which could be explained by his acute blood loss anemia. Continue IV Rocephin and azithromycin he does have crackles in his lungs relates he is short of breath with an increased yellow productive cough. Continue low-dose steroids and inhalers. Sputum cultures have been sent, strep pneumo and Legionella are pending.    History of PE: Due to GI bleed we will hold anticoagulation Place SCDs.  Combined systolic and diastolic heart failure: Appears to be compensated continue current home regimen. Resume Coreg, continue Entresto, agree with holding torsemide.  Diabetes mellitus type 2: Currently on clear liquid diet, continue sliding scale CBGs before meals and at bedtime  Essential hypertension: Mildly tachycardic, resume his Coreg but blood pressure seems to be well controlled.  Sacral decubitus ulcer  present on admission: RN Pressure Injury Documentation: Pressure Injury 10/14/20 Sacrum Medial Stage 2 -  Partial thickness loss of dermis presenting as a shallow open injury with a red, pink wound bed without slough. 2x2 cm (Active)  10/14/20 0045  Location: Sacrum  Location Orientation: Medial  Staging: Stage 2 -  Partial thickness loss of dermis presenting as a shallow open injury with a red, pink wound bed without slough.  Wound Description (Comments): 2x2 cm  Present on Admission: Yes    DVT prophylaxis: SCD Family Communication:none Status is: Inpatient  Remains inpatient appropriate because: Acute COPD exacerbation versus community-acquired pneumonia, as well as acute GI bleed GI has been consulted Eliquis has been held.        Code Status:     Code Status Orders  (From admission, onward)           Start     Ordered   08/22/21 1758  Full code  Continuous        08/22/21 1800           Code Status History     Date Active Date Inactive Code Status Order ID Comments User Context   10/14/2020 0114 10/16/2020 2138 Full Code [id number]  Vianne Bulls, MD Inpatient   10/04/2020 2149 10/06/2020 2019 Full Code [id number]  Truett Mainland, DO ED   08/31/2019 1520 09/04/2019 1831 Full Code [id number]  Barton Dubois, MD ED         IV Access:   Peripheral IV   Procedures and diagnostic studies:   DG Chest 2 View  Result Date: 08/22/2021 CLINICAL DATA:  Pt c/o SOB and chest pain for several days, pt  also c/o cough. Hx of asthma, COPD, and HTN. EXAM: CHEST - 2 VIEW COMPARISON:  None. FINDINGS: Cardiac silhouette is normal in size. No mediastinal or hilar masses. No evidence of adenopathy. There is streaky opacity at both medial lung bases that has the appearance of bronchial wall thickening and peribronchovascular opacities as well as interstitial thickening. This is greater on the left, and is similar to recent prior chest radiographs as well as a chest CT from  07/28/2021. Remainder of the lungs is clear. Trace pleural effusions.  No pneumothorax. Skeletal structures are grossly intact. IMPRESSION: 1. Left greater than right lung base opacities similar to recent prior studies. Suspect chronic bronchitis and more acute bronchopneumonia. Electronically Signed   By: Lajean Manes M.D.   On: 08/22/2021 13:31     Medical Consultants:   None.   Subjective:    Gregory Mitchell relates his breathing is about the same date had of melanotic stools yesterday.  Objective:    Vitals:   08/22/21 2016 08/22/21 2142 08/23/21 0255 08/23/21 0607  BP:  (!) 154/97 (!) 95/57 126/67  Pulse:  (!) 108 100 (!) 57  Resp:  17 17 18   Temp:  98.4 F (36.9 C) 98.3 F (36.8 C) 98.1 F (36.7 C)  TempSrc:  Oral    SpO2: 97% 100% 99% 100%  Weight:      Height:       SpO2: 100 %   Intake/Output Summary (Last 24 hours) at 08/23/2021 0710 Last data filed at 08/22/2021 1520 Gross per 24 hour  Intake 100 ml  Output --  Net 100 ml   Filed Weights   08/22/21 1849  Weight: 68.9 kg    Exam: General exam: In no acute distress. Respiratory system: Good air movement and clear to auscultation. Cardiovascular system: S1 & S2 heard, RRR. No JVD. Gastrointestinal system: Abdomen is nondistended, soft and nontender.  Extremities: No pedal edema. Skin: No rashes, lesions or ulcers Psychiatry: Judgement and insight appear normal. Mood & affect appropriate.    Data Reviewed:    Labs: Basic Metabolic Panel: Recent Labs  Lab 08/22/21 1309  NA 141  K 3.8  CL 112*  CO2 26  GLUCOSE 94  BUN 26*  CREATININE 1.29*  CALCIUM 7.8*   GFR Estimated Creatinine Clearance: 40.5 mL/min (A) (by C-G formula based on SCr of 1.29 mg/dL (H)). Liver Function Tests: No results for input(s): AST, ALT, ALKPHOS, BILITOT, PROT, ALBUMIN in the last 168 hours. No results for input(s): LIPASE, AMYLASE in the last 168 hours. No results for input(s): AMMONIA in the last 168  hours. Coagulation profile Recent Labs  Lab 08/22/21 1309 08/23/21 0606  INR 1.0 1.1   COVID-19 Labs  No results for input(s): DDIMER, FERRITIN, LDH, CRP in the last 72 hours.  Lab Results  Component Value Date   SARSCOV2NAA NEGATIVE 08/22/2021   SARSCOV2NAA POSITIVE (A) 10/04/2020   SARSCOV2NAA NEGATIVE 07/06/2020   [place] NEGATIVE 03/17/2020    CBC: Recent Labs  Lab 08/22/21 1309 08/22/21 1821 08/23/21 0006 08/23/21 0606  WBC 9.7 10.4 9.0 7.2  HGB 8.2* 8.4* 7.8* 8.5*  HCT 28.9* 31.2* 26.7* 29.5*  MCV 101.0* 103.3* 100.0 101.7*  PLT 327 407* 356 382   Cardiac Enzymes: No results for input(s): CKTOTAL, CKMB, CKMBINDEX, TROPONINI in the last 168 hours. BNP (last 3 results) No results for input(s): PROBNP in the last 8760 hours. CBG: Recent Labs  Lab 08/22/21 2154  GLUCAP 293*   D-Dimer: No results for  input(s): DDIMER in the last 72 hours. Hgb A1c: No results for input(s): HGBA1C in the last 72 hours. Lipid Profile: No results for input(s): CHOL, HDL, LDLCALC, TRIG, CHOLHDL, LDLDIRECT in the last 72 hours. Thyroid function studies: No results for input(s): TSH, T4TOTAL, T3FREE, THYROIDAB in the last 72 hours.  Invalid input(s): FREET3 Anemia work up: No results for input(s): VITAMINB12, FOLATE, FERRITIN, TIBC, IRON, RETICCTPCT in the last 72 hours. Sepsis Labs: Recent Labs  Lab 08/22/21 1309 08/22/21 1821 08/23/21 0006 08/23/21 0606  WBC 9.7 10.4 9.0 7.2   Microbiology Recent Results (from the past 240 hour(s))  Resp Panel by RT-PCR (Flu A&B, Covid) Nasopharyngeal Swab     Status: None   Collection Time: 08/22/21  5:26 PM   Specimen: Nasopharyngeal Swab; Nasopharyngeal(NP) swabs in vial transport medium  Result Value Ref Range Status   SARS Coronavirus 2 by RT PCR NEGATIVE NEGATIVE Final    Comment: (NOTE) SARS-CoV-2 target nucleic acids are NOT DETECTED.  The SARS-CoV-2 RNA is generally detectable in upper respiratory specimens during  the acute phase of infection. The lowest concentration of SARS-CoV-2 viral copies this assay can detect is 138 copies/mL. A negative result does not preclude SARS-Cov-2 infection and should not be used as the sole basis for treatment or other patient management decisions. A negative result may occur with  improper specimen collection/handling, submission of specimen other than nasopharyngeal swab, presence of viral mutation(s) within the areas targeted by this assay, and inadequate number of viral copies(<138 copies/mL). A negative result must be combined with clinical observations, patient history, and epidemiological information. The expected result is Negative.  Fact Sheet for Patients:  EntrepreneurPulse.com.au  Fact Sheet for Healthcare Providers:  IncredibleEmployment.be  This test is no t yet approved or cleared by the [place] FDA and  has been authorized for detection and/or diagnosis of SARS-CoV-2 by FDA under an Emergency Use Authorization (EUA). This EUA will remain  in effect (meaning this test can be used) for the duration of the COVID-19 declaration under Section 564(b)(1) of the Act, 21 U.S.C.section 360bbb-3(b)(1), unless the authorization is terminated  or revoked sooner.       Influenza A by PCR NEGATIVE NEGATIVE Final   Influenza B by PCR NEGATIVE NEGATIVE Final    Comment: (NOTE) The Xpert Xpress SARS-CoV-2/FLU/RSV plus assay is intended as an aid in the diagnosis of influenza from Nasopharyngeal swab specimens and should not be used as a sole basis for treatment. Nasal washings and aspirates are unacceptable for Xpert Xpress SARS-CoV-2/FLU/RSV testing.  Fact Sheet for Patients: EntrepreneurPulse.com.au  Fact Sheet for Healthcare Providers: IncredibleEmployment.be  This test is not yet approved or cleared by the [place] FDA and has been authorized for detection and/or  diagnosis of SARS-CoV-2 by FDA under an Emergency Use Authorization (EUA). This EUA will remain in effect (meaning this test can be used) for the duration of the COVID-19 declaration under Section 564(b)(1) of the Act, 21 U.S.C. section 360bbb-3(b)(1), unless the authorization is terminated or revoked.  Performed at [facility], [address] [place], [place] [address]      Medications:    sodium chloride   Intravenous Once   fluticasone furoate-vilanterol  1 puff Inhalation Daily   insulin aspart  0-15 Units Subcutaneous TID WC   insulin aspart  0-5 Units Subcutaneous QHS   ipratropium-albuterol  3 mL Nebulization Q6H   mirtazapine  15 mg Oral QHS   pantoprazole (PROTONIX) IV  40 mg Intravenous Q12H   predniSONE  40 mg Oral Q breakfast   sacubitril-valsartan  1 tablet Oral BID   tamsulosin  0.8 mg Oral BID   umeclidinium bromide  1 puff Inhalation Daily   Continuous Infusions:  azithromycin     cefTRIAXone (ROCEPHIN)  IV        LOS: 1 day   Charlynne Cousins  [facility]  08/23/2021, 7:10 AM

## 2021-08-23 NOTE — Consult Note (Addendum)
Consulting  Provider: Dr. Nehemiah Settle Primary Care Physician:  Neale Burly, MD Primary Gastroenterologist:  Dr. Abbey Chatters  Reason for Consultation: Acute on chronic anemia, melena, dysphagia  HPI:  Gregory Mitchell is a 81 y.o. male with a past medical history of combined systolic and diastolic heart failure, CAD, COPD, hypertension, pulmonary embolism chronically on apixaban, diabetes, chronic kidney disease, esophageal candidiasis, chronic GERD, chronic dysphagia, who presented to [name] ER yesterday with chief complaint of dyspnea, chest pain, generalized weakness/fatigue, and melena.  Patient notes melena for the last few weeks.  No abdominal pain but does note some epigastric discomfort.  Continues to have issues with dysphagia as well.  No odynophagia.  Chronically takes Nexium 40 mg twice daily which helps some.  Underwent EGD in August 2021 for odynophagia, chest pain, dysphagia and was found to have severe candidal esophagitis.  He has received 3 extended courses of Diflucan.  His symptoms resolved at the conclusion of these courses of this and return thereafter  He had a CT abdomen pelvis on 12/26/2020 which showed rectal wall thickening.  Patient does note chronic constipation.  CT 07/04/2021 did not show any rectal thickening.  In the ER, patient's hemoglobin 8.4 down from baseline around 10, subsequently dropped further to 7.8, now 8.5 after receiving 1 unit PRBCs.  Has been started on IV Protonix.  Also with acute COPD exacerbation versus possible community-acquired pneumonia, receiving antibiotics as well as inhalers and low-dose steroids.  Past Medical History:  Diagnosis Date   Asthma    Chronic renal insufficiency    COPD (chronic obstructive pulmonary disease) (HCC)    Heart failure (HCC)    Acute on chronic combined systolic and diastolic heart failure   Hypertension    Pulmonary emboli ([place]) 03/2019   Renal insufficiency    Type 2 diabetes mellitus ([place])     Past  Surgical History:  Procedure Laterality Date   APPENDECTOMY     BALLOON DILATION N/A 03/18/2020   Procedure: BALLOON DILATION;  Surgeon: Eloise Harman, DO;  Location: [facility];  Service: Endoscopy;  Laterality: N/A;   CHOLECYSTECTOMY     COLONOSCOPY  10/2006   Dr.Anwar:normal   ESOPHAGOGASTRODUODENOSCOPY (EGD) WITH PROPOFOL N/A 03/18/2020   Procedure: ESOPHAGOGASTRODUODENOSCOPY (EGD) WITH PROPOFOL;  Surgeon: Eloise Harman, DO;  Location: [facility];  Service: Endoscopy;  Laterality: N/A;  2:30pm   NASAL SINUS SURGERY     NEPHRECTOMY     RIGHT (Question CA.  No further therapy needed.)   RIGHT/LEFT HEART CATH AND CORONARY ANGIOGRAPHY N/A 09/03/2019   Procedure: RIGHT/LEFT HEART CATH AND CORONARY ANGIOGRAPHY;  Surgeon: Lorretta Harp, MD;  Location: [place] [facility];  Service: Cardiovascular;  Laterality: N/A;    Prior to Admission medications   Medication Sig Start Date End Date Taking? Authorizing Provider  albuterol (PROVENTIL) (5 MG/ML) 0.5% nebulizer solution Inhale into the lungs. 06/03/21 06/03/22 Yes [provider]  albuterol (VENTOLIN HFA) 108 (90 Base) MCG/ACT inhaler Inhale 2 puffs into the lungs every 6 (six) hours as needed for wheezing or shortness of breath.    Yes [provider]  apixaban (ELIQUIS) 5 MG TABS tablet Take 5 mg by mouth 2 (two) times daily.   Yes [provider]  carvedilol (COREG) 6.25 MG tablet Take 1 tablet (6.25 mg total) by mouth 2 (two) times daily. 12/01/20  Yes BranchAlphonse Guild, MD  dapagliflozin propanediol (FARXIGA) 10 MG TABS tablet Take 1 tablet (10 mg total) by mouth  daily before breakfast. 10/30/20  Yes Clegg, Amy D, NP  diclofenac Sodium (VOLTAREN) 1 % GEL Apply 1 application topically 4 (four) times daily as needed (pain). 08/29/20  Yes [provider]  ENULOSE 10 GM/15ML SOLN Take by mouth daily as needed (constipation). 04/10/21  Yes [provider]  esomeprazole (NEXIUM) 40 MG  capsule Take 1 capsule (40 mg total) by mouth 2 (two) times daily before a meal. 04/30/21 04/30/22 Yes Tamerra Merkley, Elon Alas, DO  guaiFENesin (ROBITUSSIN) 100 MG/5ML liquid Take by mouth. 07/10/21  Yes [provider]  HM SENNA-S 8.6-50 MG tablet Take 2 tablets by mouth daily. 07/10/21  Yes [provider]  HYDROcodone-acetaminophen (NORCO) 7.5-325 MG tablet Take 1 tablet by mouth 2 (two) times daily as needed. 11/27/20  Yes [provider]  ipratropium-albuterol (DUONEB) 0.5-2.5 (3) MG/3ML SOLN Take 3 mLs by nebulization every 6 (six) hours as needed (for shortness of breath/wheezing).    Yes [provider]  mirtazapine (REMERON) 15 MG tablet Take 15 mg by mouth at bedtime.   Yes [provider]  OXYGEN Inhale 4 L into the lungs at bedtime as needed (for shortness of breath).   Yes [provider]  sacubitril-valsartan (ENTRESTO) 24-26 MG Take 1 tablet by mouth 2 (two) times daily.   Yes [provider]  SM SENNA LAXATIVE 8.6 MG tablet Take 2 tablets by mouth 2 (two) times daily. 11/27/20  Yes [provider]  tamsulosin (FLOMAX) 0.4 MG CAPS capsule Take 0.8 mg by mouth 2 (two) times daily.   Yes [provider]  Donnal Debar 200-62.5-25 MCG/ACT AEPB Take 1 puff by mouth daily. 07/27/21  Yes [provider]  torsemide (DEMADEX) 20 MG tablet Take 1 tablet (20 mg total) by mouth 2 (two) times daily. Patient not taking: Reported on 08/22/2021 03/10/21   Arnoldo Lenis, MD    Current Facility-Administered Medications  Medication Dose Route Frequency Provider Last Rate Last Admin   0.9 %  sodium chloride infusion (Manually program via Guardrails IV Fluids)   Intravenous Once Truett Mainland, DO       albuterol (PROVENTIL) (2.5 MG/3ML) 0.083% nebulizer solution 3 mL  3 mL Nebulization Q6H PRN Charlynne Cousins, MD       azithromycin [facility]) 500 mg in sodium chloride 0.9 % 250 mL IVPB  500 mg Intravenous Q24H  Truett Mainland, DO 250 mL/hr at 08/23/21 0804 500 mg at 08/23/21 0804   carvedilol (COREG) tablet 6.25 mg  6.25 mg Oral BID Charlynne Cousins, MD   6.25 mg at 08/23/21 1003   cefTRIAXone (ROCEPHIN) 1 g in sodium chloride 0.9 % 100 mL IVPB  1 g Intravenous Q24H Charlynne Cousins, MD 200 mL/hr at 08/23/21 1227 1 g at 08/23/21 1227   dapagliflozin propanediol (FARXIGA) tablet 10 mg  10 mg Oral QAC breakfast Charlynne Cousins, MD   10 mg at 08/23/21 0957   fluticasone furoate-vilanterol (BREO ELLIPTA) 100-25 MCG/ACT 1 puff  1 puff Inhalation Daily Truett Mainland, DO   1 puff at 08/23/21 0817   HYDROcodone-acetaminophen (NORCO) 7.5-325 MG per tablet 1 tablet  1 tablet Oral BID PRN Truett Mainland, DO   1 tablet at 08/22/21 1855   hydrocortisone (ANUSOL-HC) 2.5 % rectal cream 1 application  1 application Topical QID PRN Truett Mainland, DO   1 application at 64/33/29 2044   insulin aspart (novoLOG) injection 0-15 Units  0-15 Units Subcutaneous TID WC Truett Mainland, DO  2 Units at 08/23/21 0757   insulin aspart (novoLOG) injection 0-5 Units  0-5 Units Subcutaneous QHS Truett Mainland, DO   3 Units at 08/22/21 2212   ipratropium-albuterol (DUONEB) 0.5-2.5 (3) MG/3ML nebulizer solution 3 mL  3 mL Nebulization Q6H Charlynne Cousins, MD       lactulose (CHRONULAC) 10 GM/15ML solution 10 g  10 g Oral Daily PRN Truett Mainland, DO       mirtazapine (REMERON) tablet 15 mg  15 mg Oral QHS Truett Mainland, DO   15 mg at 08/22/21 2203   ondansetron (ZOFRAN) tablet 4 mg  4 mg Oral Q6H PRN Truett Mainland, DO       Or   ondansetron [facility]) injection 4 mg  4 mg Intravenous Q6H PRN Truett Mainland, DO       pantoprazole (PROTONIX) injection 40 mg  40 mg Intravenous Q12H Truett Mainland, DO   40 mg at 08/23/21 0800   predniSONE (DELTASONE) tablet 20 mg  20 mg Oral Q breakfast Charlynne Cousins, MD   20 mg at 08/23/21 0758   sacubitril-valsartan (ENTRESTO) 24-26 mg per tablet  1 tablet Oral  BID Truett Mainland, DO   1 tablet at 08/23/21 0758   tamsulosin (FLOMAX) capsule 0.8 mg  0.8 mg Oral BID Truett Mainland, DO   0.8 mg at 08/23/21 0865   umeclidinium bromide (INCRUSE ELLIPTA) 62.5 MCG/ACT 1 puff  1 puff Inhalation Daily Truett Mainland, DO   1 puff at 08/23/21 7846    Allergies as of 08/22/2021 - Review Complete 08/22/2021  Allergen Reaction Noted   Amoxicillin Other (See Comments) 11/26/2011   Ciprofloxacin  11/26/2011   Sulfa antibiotics Rash 11/26/2011    Family History  Problem Relation Age of Onset   Lung cancer Brother    Stroke Mother    Breast cancer Mother    Colon cancer Neg Hx     Social History   Socioeconomic History   Marital status: Widowed    Spouse name: Not on file   Number of children: Not on file   Years of education: Not on file   Highest education level: Not on file  Occupational History   Not on file  Tobacco Use   Smoking status: Former    Packs/day: 1.00    Years: 15.00    Pack years: 15.00    Types: Cigarettes    Start date: 07/14/1957    Quit date: 08/09/1976    Years since quitting: 45.0   Smokeless tobacco: Former    Types: Chew    Quit date: 02/10/2006   Tobacco comments:    chewed tobacco for 10 years  Vaping Use   Vaping Use: Never used  Substance and Sexual Activity   Alcohol use: Not Currently    Alcohol/week: 0.0 standard drinks    Comment: no etoh since he was in his 75s   Drug use: Never   Sexual activity: Yes  Other Topics Concern   Not on file  Social History Narrative   Lives with wife.  Three children.     Social Determinants of Health   Financial Resource Strain: Not on file  Food Insecurity: Not on file  Transportation Needs: Not on file  Physical Activity: Not on file  Stress: Not on file  Social Connections: Not on file  Intimate Partner Violence: Not on file    Review of Systems: General: Negative for anorexia, weight loss, fever, chills, fatigue,  weakness. Eyes: Negative for vision  changes.  ENT: Negative for hoarseness, difficulty swallowing , nasal congestion. CV: +chest pain, +dyspnea on exertion, no peripheral edema.  Respiratory: Patient reports dyspnea, cough, increase sputum production GI: See history of present illness. GU:  Negative for dysuria, hematuria, urinary incontinence, urinary frequency, nocturnal urination.  MS: Negative for joint pain, low back pain.  Derm: Negative for rash or itching.  Neuro: Negative for weakness, abnormal sensation, seizure, frequent headaches, memory loss, confusion.  Psych: Negative for anxiety, depression Endo: Negative for unusual weight change.  Heme: Negative for bruising or bleeding. Allergy: Negative for rash or hives.  Physical Exam: Vital signs in last 24 hours: Temp:  [97.4 F (36.3 C)-98.4 F (36.9 C)] 97.9 F (36.6 C) (01/15 1300) Pulse Rate:  [57-113] 98 (01/15 1300) Resp:  [17-22] 18 (01/15 1300) BP: (95-154)/(57-97) 112/71 (01/15 1300) SpO2:  [96 %-100 %] 99 % (01/15 1300) Weight:  [68.9 kg] 68.9 kg (01/14 1849) Last BM Date: 08/23/21 General:   Alert,  Well-developed, well-nourished, pleasant and cooperative in NAD Head:  Normocephalic and atraumatic. Eyes:  Sclera clear, no icterus.   Conjunctiva pink. Ears:  Normal auditory acuity. Nose:  No deformity, discharge,  or lesions. Mouth:  No deformity or lesions, dentition normal. Neck:  Supple; no masses or thyromegaly. Lungs: Coarse breath sounds with scattered crackles Heart:  Regular rate and rhythm; no murmurs, clicks, rubs,  or gallops. Abdomen:  Soft, nontender and nondistended. No masses, hepatosplenomegaly or hernias noted. Normal bowel sounds, without guarding, and without rebound.   Msk:  Symmetrical without gross deformities. Normal posture. Pulses:  Normal pulses noted. Extremities:  Without clubbing or edema. Neurologic:  Alert and  oriented x4;  grossly normal neurologically. Skin:  Intact without significant lesions or  rashes. Cervical Nodes:  No significant cervical adenopathy. Psych:  Alert and cooperative. Normal mood and affect.  Intake/Output from previous day: 01/14 0701 - 01/15 0700 In: 100 [IV Piggyback:100] Out: -  Intake/Output this shift: Total I/O In: 120 [P.O.:120] Out: -   Lab Results: Recent Labs    08/22/21 1821 08/23/21 0006 08/23/21 0606  WBC 10.4 9.0 7.2  HGB 8.4* 7.8* 8.5*  HCT 31.2* 26.7* 29.5*  PLT 407* 356 382   BMET Recent Labs    08/22/21 1309 08/23/21 0606  NA 141 141  K 3.8 4.5  CL 112* 111  CO2 26 25  GLUCOSE 94 181*  BUN 26* 26*  CREATININE 1.29* 1.11  CALCIUM 7.8* 8.0*   LFT No results for input(s): PROT, ALBUMIN, AST, ALT, ALKPHOS, BILITOT, BILIDIR, IBILI in the last 72 hours. PT/INR Recent Labs    08/22/21 1309 08/23/21 0606  LABPROT 13.4 14.0  INR 1.0 1.1   Hepatitis Panel No results for input(s): HEPBSAG, HCVAB, HEPAIGM, HEPBIGM in the last 72 hours. C-Diff No results for input(s): CDIFFTOX in the last 72 hours.  Studies/Results: DG Chest 2 View  Result Date: 08/22/2021 CLINICAL DATA:  Pt c/o SOB and chest pain for several days, pt also c/o cough. Hx of asthma, COPD, and HTN. EXAM: CHEST - 2 VIEW COMPARISON:  None. FINDINGS: Cardiac silhouette is normal in size. No mediastinal or hilar masses. No evidence of adenopathy. There is streaky opacity at both medial lung bases that has the appearance of bronchial wall thickening and peribronchovascular opacities as well as interstitial thickening. This is greater on the left, and is similar to recent prior chest radiographs as well as a chest CT from 07/28/2021. Remainder of the  lungs is clear. Trace pleural effusions.  No pneumothorax. Skeletal structures are grossly intact. IMPRESSION: 1. Left greater than right lung base opacities similar to recent prior studies. Suspect chronic bronchitis and more acute bronchopneumonia. Electronically Signed   By: Lajean Manes M.D.   On: 08/22/2021 13:31     Impression: *GI bleeding-source likely upper given history of melena *Acute blood loss anemia due to above *Dysphagia *Previous candidal esophagitis *Systemic anticoagulation with Eliquis  Plan: Etiology of patient's upper GI bleeding unclear.  Think he certainly warrants further evaluation with EGD.  We will plan on doing this in the next 24 to 48 hours once his Eliquis has had adequate time to washout of his system as well as breathing improves.  Will evaluate for peptic ulcer disease, esophagitis, gastritis, H. Pylori, duodenitis, or other. Will also evaluate for esophageal stricture, Schatzki's ring, esophageal web or other.   Given his dysphagia, will consider esophageal dilation as well.  The risks including infection, bleed, or perforation as well as benefits, limitations, alternatives and imponderables have been reviewed with the patient. Potential for esophageal dilation, biopsy, etc. have also been reviewed.  Questions have been answered. All parties agreeable.  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  Consider flexible sigmoidoscopy at same time of EGD to further evaluate.  Continue to monitor H&H and transfuse for less than 7.  Continue on IV Protonix twice daily.  Clear liquids okay today.  Thank you for the consultation    Elon Alas. Abbey Chatters, D.O. Gastroenterology and Hepatology [facility]    LOS: 1 day     08/23/2021, 1:06 PM

## 2021-08-23 NOTE — Progress Notes (Signed)
Patients daughter called tonight to go over patients home medication. She stated that the patient had not taken Coreg for 2 months. I made her aware that it was ordered for tonight and the patients blood pressure was elevated. She was ok with him getting it but wanted to have it reevaluated in the morning.

## 2021-08-23 NOTE — Progress Notes (Signed)
Called to patients room by patients son. Son was speaking loudly and with aggression; stated that he was not going to "watch his father stay in pain!' I explained to him that his father was medicated an hour prior and that I would contact the doctor for an evaluation. He became increasingly angry and said that he would "take his father out of 'the hospital'". I contacted the doctor. Upon return to the patients room, the son was gone. I medicated Gregory Mitchell with PRN hemorrhoid medication with positive effect. Will continue to monitor.

## 2021-08-23 NOTE — H&P (View-Only) (Signed)
Consulting  Provider: Dr. Nehemiah Settle Primary Care Physician:  Neale Burly, MD Primary Gastroenterologist:  Dr. Abbey Chatters  Reason for Consultation: Acute on chronic anemia, melena, dysphagia  HPI:  Gregory Mitchell is a 81 y.o. male with a past medical history of combined systolic and diastolic heart failure, CAD, COPD, hypertension, pulmonary embolism chronically on apixaban, diabetes, chronic kidney disease, esophageal candidiasis, chronic GERD, chronic dysphagia, who presented to [name] ER yesterday with chief complaint of dyspnea, chest pain, generalized weakness/fatigue, and melena.  Patient notes melena for the last few weeks.  No abdominal pain but does note some epigastric discomfort.  Continues to have issues with dysphagia as well.  No odynophagia.  Chronically takes Nexium 40 mg twice daily which helps some.  Underwent EGD in August 2021 for odynophagia, chest pain, dysphagia and was found to have severe candidal esophagitis.  He has received 3 extended courses of Diflucan.  His symptoms resolved at the conclusion of these courses of this and return thereafter  He had a CT abdomen pelvis on 12/26/2020 which showed rectal wall thickening.  Patient does note chronic constipation.  CT 07/04/2021 did not show any rectal thickening.  In the ER, patient's hemoglobin 8.4 down from baseline around 10, subsequently dropped further to 7.8, now 8.5 after receiving 1 unit PRBCs.  Has been started on IV Protonix.  Also with acute COPD exacerbation versus possible community-acquired pneumonia, receiving antibiotics as well as inhalers and low-dose steroids.  Past Medical History:  Diagnosis Date   Asthma    Chronic renal insufficiency    COPD (chronic obstructive pulmonary disease) (HCC)    Heart failure (HCC)    Acute on chronic combined systolic and diastolic heart failure   Hypertension    Pulmonary emboli ([place]) 03/2019   Renal insufficiency    Type 2 diabetes mellitus ([place])     Past  Surgical History:  Procedure Laterality Date   APPENDECTOMY     BALLOON DILATION N/A 03/18/2020   Procedure: BALLOON DILATION;  Surgeon: Eloise Harman, DO;  Location: [facility];  Service: Endoscopy;  Laterality: N/A;   CHOLECYSTECTOMY     COLONOSCOPY  10/2006   Dr.Anwar:normal   ESOPHAGOGASTRODUODENOSCOPY (EGD) WITH PROPOFOL N/A 03/18/2020   Procedure: ESOPHAGOGASTRODUODENOSCOPY (EGD) WITH PROPOFOL;  Surgeon: Eloise Harman, DO;  Location: [facility];  Service: Endoscopy;  Laterality: N/A;  2:30pm   NASAL SINUS SURGERY     NEPHRECTOMY     RIGHT (Question CA.  No further therapy needed.)   RIGHT/LEFT HEART CATH AND CORONARY ANGIOGRAPHY N/A 09/03/2019   Procedure: RIGHT/LEFT HEART CATH AND CORONARY ANGIOGRAPHY;  Surgeon: Lorretta Harp, MD;  Location: [facility];  Service: Cardiovascular;  Laterality: N/A;    Prior to Admission medications   Medication Sig Start Date End Date Taking? Authorizing Provider  albuterol (PROVENTIL) (5 MG/ML) 0.5% nebulizer solution Inhale into the lungs. 06/03/21 06/03/22 Yes [provider]  albuterol (VENTOLIN HFA) 108 (90 Base) MCG/ACT inhaler Inhale 2 puffs into the lungs every 6 (six) hours as needed for wheezing or shortness of breath.    Yes [provider]  apixaban (ELIQUIS) 5 MG TABS tablet Take 5 mg by mouth 2 (two) times daily.   Yes [provider]  carvedilol (COREG) 6.25 MG tablet Take 1 tablet (6.25 mg total) by mouth 2 (two) times daily. 12/01/20  Yes BranchAlphonse Guild, MD  dapagliflozin propanediol (FARXIGA) 10 MG TABS tablet Take 1 tablet (10 mg total) by mouth  daily before breakfast. 10/30/20  Yes Clegg, Amy D, NP  diclofenac Sodium (VOLTAREN) 1 % GEL Apply 1 application topically 4 (four) times daily as needed (pain). 08/29/20  Yes [provider]  ENULOSE 10 GM/15ML SOLN Take by mouth daily as needed (constipation). 04/10/21  Yes [provider]  esomeprazole (NEXIUM) 40 MG  capsule Take 1 capsule (40 mg total) by mouth 2 (two) times daily before a meal. 04/30/21 04/30/22 Yes Marielena Harvell, Elon Alas, DO  guaiFENesin (ROBITUSSIN) 100 MG/5ML liquid Take by mouth. 07/10/21  Yes [provider]  HM SENNA-S 8.6-50 MG tablet Take 2 tablets by mouth daily. 07/10/21  Yes [provider]  HYDROcodone-acetaminophen (NORCO) 7.5-325 MG tablet Take 1 tablet by mouth 2 (two) times daily as needed. 11/27/20  Yes [provider]  ipratropium-albuterol (DUONEB) 0.5-2.5 (3) MG/3ML SOLN Take 3 mLs by nebulization every 6 (six) hours as needed (for shortness of breath/wheezing).    Yes [provider]  mirtazapine (REMERON) 15 MG tablet Take 15 mg by mouth at bedtime.   Yes [provider]  OXYGEN Inhale 4 L into the lungs at bedtime as needed (for shortness of breath).   Yes [provider]  sacubitril-valsartan (ENTRESTO) 24-26 MG Take 1 tablet by mouth 2 (two) times daily.   Yes [provider]  SM SENNA LAXATIVE 8.6 MG tablet Take 2 tablets by mouth 2 (two) times daily. 11/27/20  Yes [provider]  tamsulosin (FLOMAX) 0.4 MG CAPS capsule Take 0.8 mg by mouth 2 (two) times daily.   Yes [provider]  Donnal Debar 200-62.5-25 MCG/ACT AEPB Take 1 puff by mouth daily. 07/27/21  Yes [provider]  torsemide (DEMADEX) 20 MG tablet Take 1 tablet (20 mg total) by mouth 2 (two) times daily. Patient not taking: Reported on 08/22/2021 03/10/21   Arnoldo Lenis, MD    Current Facility-Administered Medications  Medication Dose Route Frequency Provider Last Rate Last Admin   0.9 %  sodium chloride infusion (Manually program via Guardrails IV Fluids)   Intravenous Once Truett Mainland, DO       albuterol (PROVENTIL) (2.5 MG/3ML) 0.083% nebulizer solution 3 mL  3 mL Nebulization Q6H PRN Charlynne Cousins, MD       azithromycin [facility]) 500 mg in sodium chloride 0.9 % 250 mL IVPB  500 mg Intravenous Q24H  Truett Mainland, DO 250 mL/hr at 08/23/21 0804 500 mg at 08/23/21 0804   carvedilol (COREG) tablet 6.25 mg  6.25 mg Oral BID Charlynne Cousins, MD   6.25 mg at 08/23/21 1003   cefTRIAXone (ROCEPHIN) 1 g in sodium chloride 0.9 % 100 mL IVPB  1 g Intravenous Q24H Charlynne Cousins, MD 200 mL/hr at 08/23/21 1227 1 g at 08/23/21 1227   dapagliflozin propanediol (FARXIGA) tablet 10 mg  10 mg Oral QAC breakfast Charlynne Cousins, MD   10 mg at 08/23/21 0957   fluticasone furoate-vilanterol (BREO ELLIPTA) 100-25 MCG/ACT 1 puff  1 puff Inhalation Daily Truett Mainland, DO   1 puff at 08/23/21 0817   HYDROcodone-acetaminophen (NORCO) 7.5-325 MG per tablet 1 tablet  1 tablet Oral BID PRN Truett Mainland, DO   1 tablet at 08/22/21 1855   hydrocortisone (ANUSOL-HC) 2.5 % rectal cream 1 application  1 application Topical QID PRN Truett Mainland, DO   1 application at 45/80/99 2044   insulin aspart (novoLOG) injection 0-15 Units  0-15 Units Subcutaneous TID WC Truett Mainland, DO  2 Units at 08/23/21 0757   insulin aspart (novoLOG) injection 0-5 Units  0-5 Units Subcutaneous QHS Truett Mainland, DO   3 Units at 08/22/21 2212   ipratropium-albuterol (DUONEB) 0.5-2.5 (3) MG/3ML nebulizer solution 3 mL  3 mL Nebulization Q6H Charlynne Cousins, MD       lactulose (CHRONULAC) 10 GM/15ML solution 10 g  10 g Oral Daily PRN Truett Mainland, DO       mirtazapine (REMERON) tablet 15 mg  15 mg Oral QHS Truett Mainland, DO   15 mg at 08/22/21 2203   ondansetron (ZOFRAN) tablet 4 mg  4 mg Oral Q6H PRN Truett Mainland, DO       Or   ondansetron [facility]) injection 4 mg  4 mg Intravenous Q6H PRN Truett Mainland, DO       pantoprazole (PROTONIX) injection 40 mg  40 mg Intravenous Q12H Truett Mainland, DO   40 mg at 08/23/21 0800   predniSONE (DELTASONE) tablet 20 mg  20 mg Oral Q breakfast Charlynne Cousins, MD   20 mg at 08/23/21 0758   sacubitril-valsartan (ENTRESTO) 24-26 mg per tablet  1 tablet Oral  BID Truett Mainland, DO   1 tablet at 08/23/21 0758   tamsulosin (FLOMAX) capsule 0.8 mg  0.8 mg Oral BID Truett Mainland, DO   0.8 mg at 08/23/21 9030   umeclidinium bromide (INCRUSE ELLIPTA) 62.5 MCG/ACT 1 puff  1 puff Inhalation Daily Truett Mainland, DO   1 puff at 08/23/21 0923    Allergies as of 08/22/2021 - Review Complete 08/22/2021  Allergen Reaction Noted   Amoxicillin Other (See Comments) 11/26/2011   Ciprofloxacin  11/26/2011   Sulfa antibiotics Rash 11/26/2011    Family History  Problem Relation Age of Onset   Lung cancer Brother    Stroke Mother    Breast cancer Mother    Colon cancer Neg Hx     Social History   Socioeconomic History   Marital status: Widowed    Spouse name: Not on file   Number of children: Not on file   Years of education: Not on file   Highest education level: Not on file  Occupational History   Not on file  Tobacco Use   Smoking status: Former    Packs/day: 1.00    Years: 15.00    Pack years: 15.00    Types: Cigarettes    Start date: 07/14/1957    Quit date: 08/09/1976    Years since quitting: 45.0   Smokeless tobacco: Former    Types: Chew    Quit date: 02/10/2006   Tobacco comments:    chewed tobacco for 10 years  Vaping Use   Vaping Use: Never used  Substance and Sexual Activity   Alcohol use: Not Currently    Alcohol/week: 0.0 standard drinks    Comment: no etoh since he was in his 78s   Drug use: Never   Sexual activity: Yes  Other Topics Concern   Not on file  Social History Narrative   Lives with wife.  Three children.     Social Determinants of Health   Financial Resource Strain: Not on file  Food Insecurity: Not on file  Transportation Needs: Not on file  Physical Activity: Not on file  Stress: Not on file  Social Connections: Not on file  Intimate Partner Violence: Not on file    Review of Systems: General: Negative for anorexia, weight loss, fever, chills, fatigue,  weakness. Eyes: Negative for vision  changes.  ENT: Negative for hoarseness, difficulty swallowing , nasal congestion. CV: +chest pain, +dyspnea on exertion, no peripheral edema.  Respiratory: Patient reports dyspnea, cough, increase sputum production GI: See history of present illness. GU:  Negative for dysuria, hematuria, urinary incontinence, urinary frequency, nocturnal urination.  MS: Negative for joint pain, low back pain.  Derm: Negative for rash or itching.  Neuro: Negative for weakness, abnormal sensation, seizure, frequent headaches, memory loss, confusion.  Psych: Negative for anxiety, depression Endo: Negative for unusual weight change.  Heme: Negative for bruising or bleeding. Allergy: Negative for rash or hives.  Physical Exam: Vital signs in last 24 hours: Temp:  [97.4 F (36.3 C)-98.4 F (36.9 C)] 97.9 F (36.6 C) (01/15 1300) Pulse Rate:  [57-113] 98 (01/15 1300) Resp:  [17-22] 18 (01/15 1300) BP: (95-154)/(57-97) 112/71 (01/15 1300) SpO2:  [96 %-100 %] 99 % (01/15 1300) Weight:  [68.9 kg] 68.9 kg (01/14 1849) Last BM Date: 08/23/21 General:   Alert,  Well-developed, well-nourished, pleasant and cooperative in NAD Head:  Normocephalic and atraumatic. Eyes:  Sclera clear, no icterus.   Conjunctiva pink. Ears:  Normal auditory acuity. Nose:  No deformity, discharge,  or lesions. Mouth:  No deformity or lesions, dentition normal. Neck:  Supple; no masses or thyromegaly. Lungs: Coarse breath sounds with scattered crackles Heart:  Regular rate and rhythm; no murmurs, clicks, rubs,  or gallops. Abdomen:  Soft, nontender and nondistended. No masses, hepatosplenomegaly or hernias noted. Normal bowel sounds, without guarding, and without rebound.   Msk:  Symmetrical without gross deformities. Normal posture. Pulses:  Normal pulses noted. Extremities:  Without clubbing or edema. Neurologic:  Alert and  oriented x4;  grossly normal neurologically. Skin:  Intact without significant lesions or  rashes. Cervical Nodes:  No significant cervical adenopathy. Psych:  Alert and cooperative. Normal mood and affect.  Intake/Output from previous day: 01/14 0701 - 01/15 0700 In: 100 [IV Piggyback:100] Out: -  Intake/Output this shift: Total I/O In: 120 [P.O.:120] Out: -   Lab Results: Recent Labs    08/22/21 1821 08/23/21 0006 08/23/21 0606  WBC 10.4 9.0 7.2  HGB 8.4* 7.8* 8.5*  HCT 31.2* 26.7* 29.5*  PLT 407* 356 382   BMET Recent Labs    08/22/21 1309 08/23/21 0606  NA 141 141  K 3.8 4.5  CL 112* 111  CO2 26 25  GLUCOSE 94 181*  BUN 26* 26*  CREATININE 1.29* 1.11  CALCIUM 7.8* 8.0*   LFT No results for input(s): PROT, ALBUMIN, AST, ALT, ALKPHOS, BILITOT, BILIDIR, IBILI in the last 72 hours. PT/INR Recent Labs    08/22/21 1309 08/23/21 0606  LABPROT 13.4 14.0  INR 1.0 1.1   Hepatitis Panel No results for input(s): HEPBSAG, HCVAB, HEPAIGM, HEPBIGM in the last 72 hours. C-Diff No results for input(s): CDIFFTOX in the last 72 hours.  Studies/Results: DG Chest 2 View  Result Date: 08/22/2021 CLINICAL DATA:  Pt c/o SOB and chest pain for several days, pt also c/o cough. Hx of asthma, COPD, and HTN. EXAM: CHEST - 2 VIEW COMPARISON:  None. FINDINGS: Cardiac silhouette is normal in size. No mediastinal or hilar masses. No evidence of adenopathy. There is streaky opacity at both medial lung bases that has the appearance of bronchial wall thickening and peribronchovascular opacities as well as interstitial thickening. This is greater on the left, and is similar to recent prior chest radiographs as well as a chest CT from 07/28/2021. Remainder of the  lungs is clear. Trace pleural effusions.  No pneumothorax. Skeletal structures are grossly intact. IMPRESSION: 1. Left greater than right lung base opacities similar to recent prior studies. Suspect chronic bronchitis and more acute bronchopneumonia. Electronically Signed   By: Lajean Manes M.D.   On: 08/22/2021 13:31     Impression: *GI bleeding-source likely upper given history of melena *Acute blood loss anemia due to above *Dysphagia *Previous candidal esophagitis *Systemic anticoagulation with Eliquis  Plan: Etiology of patient's upper GI bleeding unclear.  Think he certainly warrants further evaluation with EGD.  We will plan on doing this in the next 24 to 48 hours once his Eliquis has had adequate time to washout of his system as well as breathing improves.  Will evaluate for peptic ulcer disease, esophagitis, gastritis, H. Pylori, duodenitis, or other. Will also evaluate for esophageal stricture, Schatzki's ring, esophageal web or other.   Given his dysphagia, will consider esophageal dilation as well.  The risks including infection, bleed, or perforation as well as benefits, limitations, alternatives and imponderables have been reviewed with the patient. Potential for esophageal dilation, biopsy, etc. have also been reviewed.  Questions have been answered. All parties agreeable.  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  Consider flexible sigmoidoscopy at same time of EGD to further evaluate.  Continue to monitor H&H and transfuse for less than 7.  Continue on IV Protonix twice daily.  Clear liquids okay today.  Thank you for the consultation    Elon Alas. Abbey Chatters, D.O. Gastroenterology and Hepatology [facility]    LOS: 1 day     08/23/2021, 1:06 PM

## 2021-08-24 ENCOUNTER — Encounter (HOSPITAL_COMMUNITY)
Admission: EM | Disposition: A | Discharge status: Home Health | Payer: Self-pay | Source: Home / Self Care | Attending: Internal Medicine

## 2021-08-24 ENCOUNTER — Inpatient Hospital Stay (HOSPITAL_COMMUNITY): Payer: Medicare HMO | Admitting: Anesthesiology

## 2021-08-24 ENCOUNTER — Encounter (HOSPITAL_COMMUNITY): Payer: Self-pay | Admitting: Family Medicine

## 2021-08-24 DIAGNOSIS — K2970 Gastritis, unspecified, without bleeding: Secondary | ICD-10-CM

## 2021-08-24 DIAGNOSIS — B3781 Candidal esophagitis: Secondary | ICD-10-CM

## 2021-08-24 DIAGNOSIS — R1310 Dysphagia, unspecified: Secondary | ICD-10-CM

## 2021-08-24 DIAGNOSIS — R933 Abnormal findings on diagnostic imaging of other parts of digestive tract: Secondary | ICD-10-CM

## 2021-08-24 DIAGNOSIS — L89159 Pressure ulcer of sacral region, unspecified stage: Secondary | ICD-10-CM

## 2021-08-24 HISTORY — PX: ESOPHAGOGASTRODUODENOSCOPY (EGD) WITH PROPOFOL: SHX5813

## 2021-08-24 HISTORY — PX: FLEXIBLE SIGMOIDOSCOPY: SHX5431

## 2021-08-24 HISTORY — PX: ESOPHAGEAL BRUSHING: SHX6842

## 2021-08-24 LAB — CBC
HCT: 26.4 % — ABNORMAL LOW (ref 39.0–52.0)
HCT: 31.4 % — ABNORMAL LOW (ref 39.0–52.0)
Hemoglobin: 7.7 g/dL — ABNORMAL LOW (ref 13.0–17.0)
Hemoglobin: 9.2 g/dL — ABNORMAL LOW (ref 13.0–17.0)
MCH: 29.5 pg (ref 26.0–34.0)
MCH: 29.4 pg (ref 26.0–34.0)
MCHC: 29.2 g/dL — ABNORMAL LOW (ref 30.0–36.0)
MCHC: 29.3 g/dL — ABNORMAL LOW (ref 30.0–36.0)
MCV: 101.1 fL — ABNORMAL HIGH (ref 80.0–100.0)
MCV: 100.3 fL — ABNORMAL HIGH (ref 80.0–100.0)
Platelets: 353 10*3/uL (ref 150–400)
Platelets: 401 10*3/uL — ABNORMAL HIGH (ref 150–400)
RBC: 2.61 MIL/uL — ABNORMAL LOW (ref 4.22–5.81)
RBC: 3.13 MIL/uL — ABNORMAL LOW (ref 4.22–5.81)
RDW: 17.5 % — ABNORMAL HIGH (ref 11.5–15.5)
RDW: 17.2 % — ABNORMAL HIGH (ref 11.5–15.5)
WBC: 8.4 10*3/uL (ref 4.0–10.5)
WBC: 8.7 10*3/uL (ref 4.0–10.5)
nRBC: 0 % (ref 0.0–0.2)
nRBC: 0 % (ref 0.0–0.2)

## 2021-08-24 LAB — KOH PREP

## 2021-08-24 LAB — GLUCOSE, CAPILLARY
Glucose-Capillary: 110 mg/dL — ABNORMAL HIGH (ref 70–99)
Glucose-Capillary: 136 mg/dL — ABNORMAL HIGH (ref 70–99)
Glucose-Capillary: 129 mg/dL — ABNORMAL HIGH (ref 70–99)
Glucose-Capillary: 144 mg/dL — ABNORMAL HIGH (ref 70–99)
Glucose-Capillary: 174 mg/dL — ABNORMAL HIGH (ref 70–99)

## 2021-08-24 SURGERY — ESOPHAGOGASTRODUODENOSCOPY (EGD) WITH PROPOFOL
Anesthesia: General

## 2021-08-24 MED ORDER — FLUCONAZOLE 100 MG PO TABS
400.0000 mg | ORAL_TABLET | Freq: Once | ORAL | Status: AC
  Administered 2021-08-24: 400 mg via ORAL
  Filled 2021-08-24: qty 4

## 2021-08-24 MED ORDER — PHENYLEPHRINE HCL (PRESSORS) 10 MG/ML IV SOLN
INTRAVENOUS | Status: DC | PRN
  Administered 2021-08-24 (×2): 80 ug via INTRAVENOUS

## 2021-08-24 MED ORDER — TORSEMIDE 20 MG PO TABS: 20.0000 mg | ORAL_TABLET | Freq: Two times a day (BID) | ORAL | Status: DC

## 2021-08-24 MED ORDER — FLUCONAZOLE 100 MG PO TABS
400.0000 mg | ORAL_TABLET | Freq: Once | ORAL | Status: DC
  Filled 2021-08-24: qty 2

## 2021-08-24 MED ORDER — FLUCONAZOLE 100 MG PO TABS
200.0000 mg | ORAL_TABLET | Freq: Every day | ORAL | Status: DC
  Administered 2021-08-25: 200 mg via ORAL
  Filled 2021-08-24: qty 2
  Filled 2021-08-24: qty 1

## 2021-08-24 MED ORDER — LIDOCAINE HCL URETHRAL/MUCOSAL 2 % EX GEL
CUTANEOUS | Status: AC
  Filled 2021-08-24: qty 11

## 2021-08-24 MED ORDER — PROPOFOL 10 MG/ML IV BOLUS
INTRAVENOUS | Status: DC | PRN
  Administered 2021-08-24: 50 mg via INTRAVENOUS
  Administered 2021-08-24: 80 mg via INTRAVENOUS
  Administered 2021-08-24: 30 mg via INTRAVENOUS

## 2021-08-24 MED ORDER — IPRATROPIUM-ALBUTEROL 0.5-2.5 (3) MG/3ML IN SOLN
3.0000 mL | Freq: Two times a day (BID) | RESPIRATORY_TRACT | Status: DC
  Administered 2021-08-24 – 2021-08-25 (×2): 3 mL via RESPIRATORY_TRACT
  Filled 2021-08-24 (×2): qty 3

## 2021-08-24 MED ORDER — LACTATED RINGERS IV SOLN
INTRAVENOUS | Status: DC
  Administered 2021-08-24: 1000 mL via INTRAVENOUS

## 2021-08-24 MED ORDER — LIDOCAINE HCL (CARDIAC) PF 100 MG/5ML IV SOSY
PREFILLED_SYRINGE | INTRAVENOUS | Status: DC | PRN
  Administered 2021-08-24: 40 mg via INTRAVENOUS

## 2021-08-24 MED ORDER — SODIUM CHLORIDE 0.9 % IV SOLN: INTRAVENOUS | Status: DC

## 2021-08-24 MED ORDER — POLYETHYLENE GLYCOL 3350 17 G PO PACK
17.0000 g | PACK | Freq: Two times a day (BID) | ORAL | Status: DC
  Administered 2021-08-24 – 2021-08-25 (×2): 17 g via ORAL
  Filled 2021-08-24 (×2): qty 1

## 2021-08-24 NOTE — Transfer of Care (Signed)
Immediate Anesthesia Transfer of Care Note  Patient: Gregory Mitchell  Procedure(s) Performed: ESOPHAGOGASTRODUODENOSCOPY (EGD) WITH PROPOFOL FLEXIBLE SIGMOIDOSCOPY ESOPHAGEAL BRUSHING  Patient Location: PACU  Anesthesia Type:General  Level of Consciousness: awake, alert  and oriented  Airway & Oxygen Therapy: Patient Spontanous Breathing  Post-op Assessment: Report given to RN and Post -op Vital signs reviewed and stable  Post vital signs: Reviewed and stable  Last Vitals:  Vitals Value Taken Time  BP    Temp    Pulse 103 08/24/21 1340  Resp 23 08/24/21 1340  SpO2 100 % 08/24/21 1340  Vitals shown include unvalidated device data.  Last Pain:  Vitals:   08/24/21 1322  TempSrc:   PainSc: 8       Patients Stated Pain Goal: 6 (62/69/48 5462)  Complications: No notable events documented.

## 2021-08-24 NOTE — Interval H&P Note (Signed)
History and Physical Interval Note:  08/24/2021 1:23 PM  Gregory Mitchell  has presented today for surgery, with the diagnosis of acute on chronic anemia, melena, dysphagia, history of proctitis.  The various methods of treatment have been discussed with the patient and family. After consideration of risks, benefits and other options for treatment, the patient has consented to  Procedure(s) with comments: ESOPHAGOGASTRODUODENOSCOPY (EGD) WITH PROPOFOL (N/A) - with dilation FLEXIBLE SIGMOIDOSCOPY (N/A) as a surgical intervention.  The patient's history has been reviewed, patient examined, no change in status, stable for surgery.  I have reviewed the patient's chart and labs.  Questions were answered to the patient's satisfaction.     Eloise Harman

## 2021-08-24 NOTE — Progress Notes (Signed)
Patient is here for symptomatic anemia. I performed two tap water enemas and patient had an egd. He is a&o x4.

## 2021-08-24 NOTE — Op Note (Addendum)
[facility] Patient Name: Gregory Mitchell Procedure Date: 08/24/2021 1:11 PM MRN: [id number] Date of Birth: 05-06-1941 Attending MD: Elon Alas. Abbey Chatters DO CSN: [id number] Age: 81 Admit Type: Outpatient Procedure:                Flexible Sigmoidoscopy Indications:              Abnormal CT of the GI tract Providers:                Elon Alas. Abbey Chatters, DO, Caprice Kluver, Charlsie Quest                            Theda Sers RN, RN, Raphael Gibney, Technician Referring MD:              Medicines:                 Complications:            No immediate complications. Estimated Blood Loss:     Estimated blood loss: none. Procedure:                Pre-Anesthesia Assessment:                           - The anesthesia plan was to use monitored                            anesthesia care (MAC).                           After obtaining informed consent, the scope was                            passed under direct vision.The flexible                            sigmoidoscopy was accomplished without difficulty.                            The patient tolerated the procedure well. The                            quality of the bowel preparation was good. The                            [id number]-[id number] ([id number]) scope was introduced through the                            anus and advanced to the the descending colon. Scope In: 1:32:23 PM Scope Out: 1:36:02 PM Total Procedure Duration: 0 hours 3 minutes 39 seconds  Findings:      The perianal exam findings include sacral decubitus ulcer.      The recto-sigmoid colon, sigmoid colon and descending colon appeared       normal. Impression:               - Sacral decubitus ulcer found on perianal exam.                           -  The recto-sigmoid colon, sigmoid colon and                            descending colon are normal.                           - No specimens collected. Moderate Sedation:      Per Anesthesia Care Recommendation:           - Return patient to  hospital ward for ongoing care.                           - See EGD note Procedure Code(s):        --- Professional ---                           [phone], Sigmoidoscopy, flexible; diagnostic,                            including collection of specimen(s) by brushing or                            washing, when performed (separate procedure) Diagnosis Code(s):        --- Professional ---                           R93.3, Abnormal findings on diagnostic imaging of                            other parts of digestive tract CPT copyright 2019 American Medical Association. All rights reserved. The codes documented in this report are preliminary and upon coder review may  be revised to meet current compliance requirements. Elon Alas. Abbey Chatters, DO [place] Abbey Chatters, DO 08/24/2021 1:46:36 PM This report has been signed electronically. Number of Addenda: 0

## 2021-08-24 NOTE — Progress Notes (Signed)
Subjective: Denies any BM since admission. No overt GI bleeding. Doesn't have an appetite. Feels like breathing is back to baseline. On 2 liters nasal cannula. He actually wears 4 liters at home. Last dose of Eliquis Friday evening. He is eager to have procedure.   Objective: Vital signs in last 24 hours: Temp:  [97.7 F (36.5 C)-98.2 F (36.8 C)] 98.2 F (36.8 C) (01/16 0416) Pulse Rate:  [62-98] 62 (01/16 0416) Resp:  [18] 18 (01/15 2028) BP: (112-123)/(54-71) 123/68 (01/16 0416) SpO2:  [98 %-100 %] 99 % (01/16 0703) Last BM Date: 08/23/21 General:   Alert and oriented, pleasant. 2 liters O2 via nasal cannula Head:  Normocephalic and atraumatic. Lungs: scattered rhonchi, no distress Abdomen:  Bowel sounds present, soft, mild TTP epigastric, non-distended.  Neurologic:  Alert and  oriented x4; Psych:  Alert and cooperative. Normal mood and affect.  Intake/Output from previous day: 01/15 0701 - 01/16 0700 In: 1290 [P.O.:940; IV Piggyback:350] Out: 200 [Urine:200] Intake/Output this shift: No intake/output data recorded.  Lab Results: Recent Labs    08/23/21 0606 08/23/21 1602 08/24/21 0512  WBC 7.2 10.0 8.4  HGB 8.5* 8.3* 7.7*  HCT 29.5* 28.5* 26.4*  PLT 382 381 353   BMET Recent Labs    08/22/21 1309 08/23/21 0606  NA 141 141  K 3.8 4.5  CL 112* 111  CO2 26 25  GLUCOSE 94 181*  BUN 26* 26*  CREATININE 1.29* 1.11  CALCIUM 7.8* 8.0*    PT/INR Recent Labs    08/22/21 1309 08/23/21 0606  LABPROT 13.4 14.0  INR 1.0 1.1    Studies/Results: DG Chest 2 View  Result Date: 08/22/2021 CLINICAL DATA:  Pt c/o SOB and chest pain for several days, pt also c/o cough. Hx of asthma, COPD, and HTN. EXAM: CHEST - 2 VIEW COMPARISON:  None. FINDINGS: Cardiac silhouette is normal in size. No mediastinal or hilar masses. No evidence of adenopathy. There is streaky opacity at both medial lung bases that has the appearance of bronchial wall thickening and  peribronchovascular opacities as well as interstitial thickening. This is greater on the left, and is similar to recent prior chest radiographs as well as a chest CT from 07/28/2021. Remainder of the lungs is clear. Trace pleural effusions.  No pneumothorax. Skeletal structures are grossly intact. IMPRESSION: 1. Left greater than right lung base opacities similar to recent prior studies. Suspect chronic bronchitis and more acute bronchopneumonia. Electronically Signed   By: Lajean Manes M.D.   On: 08/22/2021 13:31    Assessment:  Gregory Mitchell with history of heart failure, CAD, COPD, HTN, PE on Eliquis, DB, CKD, history of esophageal candida, chronic GERD, dysphagia, presenting this admission with symptomatic anemia, melena, acute COPD exacerbation vs possible CAP.   Acute blood loss anemia: with heme positive stool. Last dose of Eliquis on Friday evening. Received 1 unit of PRBCs. Hgb down to 7.7 this morning, previously 8.3 yesterday. No overt GI bleeding. Plans for EGD today.   History of dysphagia: previously treated for candida X 3 extended courses in 2021. Dilation as appropriate.   History of rectal wall thickening: on CT May 2022 in [place]. None reported Nov 2022 at [place]. Flex sig today.  Constipation: received dose of lactulose yesterday morning. Will start Miralax BID after procedures and consider Linzess if no improvement.   Possible pneumonia: he reports chronic cough but feels improved this admission. Denies SOB. He is on 2 liters nasal cannula and actually on 4 liters  at home. No distress. Plans for EGD/dilation and flex sig today.     Plan: PPI BID  EGD +/- dilation, flex sig today by Dr. Abbey Chatters. Discussed risks and benefits with stated understanding  Tap water enema X 2  Continue to hold Eliquis  Miralax BID, consider Linzess   Gregory Needs, PhD, ANP-BC [facility]    LOS: 2 days    08/24/2021, 8:40 AM

## 2021-08-24 NOTE — Progress Notes (Signed)
[facility] PROGRESS NOTE    Progress Note  Gregory Mitchell  YQM:[id number] DOB: [birth year] DOA: 08/22/2021 PCP: Neale Burly, MD     Brief Narrative:   Gregory Mitchell is an 81 y.o. male past medical history of combined systolic and diastolic heart failure, essential hypertension pulmonary embolism on apixaban [place] mellitus type II, chronic kidney disease stage IIIb comes in for shortness of breath, productive thick sputum from baseline.  He relates his pain is worse with inspiration has been having melanotic stools for the last couple of weeks.  Hemoglobin in the ED is 8.2 (baseline 10.3), creatinine 1.2    Assessment/Plan:   Acute blood loss anemia/symptomatic anemia/acute GI bleed: CBCs every 12, he status post 1 unit of packed red blood cells.  Hemoglobin this morning 7.7.  Continue to check CBCs every 12. GI was consulted which will allow 48 hours before proceeding with EGD and possible flexible sigmoidoscopy Patient is currently stable. Continue to hold apixaban.  Productive cough is improved, continue Rocephin and azithro  Acute COPD exacerbation versus possible community-acquired pneumonia: Has no fever, just of productive cough, with some shortness of breath which could be explained by his acute blood loss anemia. Productive cough is improved, continue Rocephin and azithro Continue low-dose steroids and inhalers.    History of PE: Due to GI bleed we will hold anticoagulation Place SCDs.  Combined systolic and diastolic heart failure: Appears to be compensated continue current home regimen. Resume Coreg, continue Entresto, agree with holding torsemide.  Diabetes mellitus type 2: Currently on clear liquid diet, continue sliding scale CBGs before meals and at bedtime  Essential hypertension: Mildly tachycardic, resume his Coreg but blood pressure seems to be well controlled.  Sacral decubitus ulcer present on admission: RN Pressure Injury  Documentation: Pressure Injury 10/14/20 Sacrum Medial Stage 2 -  Partial thickness loss of dermis presenting as a shallow open injury with a red, pink wound bed without slough. 2x2 cm (Active)  10/14/20 0045  Location: Sacrum  Location Orientation: Medial  Staging: Stage 2 -  Partial thickness loss of dermis presenting as a shallow open injury with a red, pink wound bed without slough.  Wound Description (Comments): 2x2 cm  Present on Admission: Yes    DVT prophylaxis: SCD Family Communication:none Status is: Inpatient  Remains inpatient appropriate because: Acute COPD exacerbation versus community-acquired pneumonia, as well as acute GI bleed GI has been consulted Eliquis has been held.        Code Status:     Code Status Orders  (From admission, onward)           Start     Ordered   08/22/21 1758  Full code  Continuous        08/22/21 1800           Code Status History     Date Active Date Inactive Code Status Order ID Comments User Context   10/14/2020 0114 10/16/2020 2138 Full Code [id number]  Vianne Bulls, MD Inpatient   10/04/2020 2149 10/06/2020 2019 Full Code [id number]  Truett Mainland, DO ED   08/31/2019 1520 09/04/2019 1831 Full Code [id number]  Barton Dubois, MD ED         IV Access:   Peripheral IV   Procedures and diagnostic studies:   DG Chest 2 View  Result Date: 08/22/2021 CLINICAL DATA:  Pt c/o SOB and chest pain for several days, pt also c/o cough. Hx of asthma, COPD, and HTN. EXAM: CHEST -  2 VIEW COMPARISON:  None. FINDINGS: Cardiac silhouette is normal in size. No mediastinal or hilar masses. No evidence of adenopathy. There is streaky opacity at both medial lung bases that has the appearance of bronchial wall thickening and peribronchovascular opacities as well as interstitial thickening. This is greater on the left, and is similar to recent prior chest radiographs as well as a chest CT from 07/28/2021. Remainder of the lungs is clear.  Trace pleural effusions.  No pneumothorax. Skeletal structures are grossly intact. IMPRESSION: 1. Left greater than right lung base opacities similar to recent prior studies. Suspect chronic bronchitis and more acute bronchopneumonia. Electronically Signed   By: Lajean Manes M.D.   On: 08/22/2021 13:31     Medical Consultants:   None.   Subjective:    Gregory Mitchell no further bloody bowel movements. Objective:    Vitals:   08/23/21 1948 08/23/21 2028 08/24/21 0416 08/24/21 0703  BP:  (!) 117/54 123/68   Pulse:  76 62   Resp:  18    Temp:  97.7 F (36.5 C) 98.2 F (36.8 C)   TempSrc:  Oral Oral   SpO2: 98% 100% 100% 99%  Weight:      Height:       SpO2: 99 % O2 Flow Rate (L/min): 2 L/min   Intake/Output Summary (Last 24 hours) at 08/24/2021 0735 Last data filed at 08/23/2021 1839 Gross per 24 hour  Intake 1290 ml  Output 200 ml  Net 1090 ml    Filed Weights   08/22/21 1849  Weight: 68.9 kg    Exam: General exam: In no acute distress. Respiratory system: Good air movement and clear to auscultation. Cardiovascular system: S1 & S2 heard, RRR. No JVD. Gastrointestinal system: Abdomen is nondistended, soft and nontender.  Extremities: No pedal edema. Skin: No rashes, lesions or ulcers Psychiatry: Judgement and insight appear normal. Mood & affect appropriate.   Data Reviewed:    Labs: Basic Metabolic Panel: Recent Labs  Lab 08/22/21 1309 08/23/21 0606  NA 141 141  K 3.8 4.5  CL 112* 111  CO2 26 25  GLUCOSE 94 181*  BUN 26* 26*  CREATININE 1.29* 1.11  CALCIUM 7.8* 8.0*    GFR Estimated Creatinine Clearance: 47.1 mL/min (by C-G formula based on SCr of 1.11 mg/dL). Liver Function Tests: No results for input(s): AST, ALT, ALKPHOS, BILITOT, PROT, ALBUMIN in the last 168 hours. No results for input(s): LIPASE, AMYLASE in the last 168 hours. No results for input(s): AMMONIA in the last 168 hours. Coagulation profile Recent Labs  Lab 08/22/21 1309  08/23/21 0606  INR 1.0 1.1    COVID-19 Labs  No results for input(s): DDIMER, FERRITIN, LDH, CRP in the last 72 hours.  Lab Results  Component Value Date   SARSCOV2NAA NEGATIVE 08/22/2021   SARSCOV2NAA POSITIVE (A) 10/04/2020   SARSCOV2NAA NEGATIVE 07/06/2020   [place] NEGATIVE 03/17/2020    CBC: Recent Labs  Lab 08/22/21 1821 08/23/21 0006 08/23/21 0606 08/23/21 1602 08/24/21 0512  WBC 10.4 9.0 7.2 10.0 8.4  HGB 8.4* 7.8* 8.5* 8.3* 7.7*  HCT 31.2* 26.7* 29.5* 28.5* 26.4*  MCV 103.3* 100.0 101.7* 100.4* 101.1*  PLT 407* 356 382 381 353    Cardiac Enzymes: No results for input(s): CKTOTAL, CKMB, CKMBINDEX, TROPONINI in the last 168 hours. BNP (last 3 results) No results for input(s): PROBNP in the last 8760 hours. CBG: Recent Labs  Lab 08/23/21 0747 08/23/21 1112 08/23/21 1321 08/23/21 1622 08/23/21 2025  GLUCAP 121*  67* 191* 145* 138*    D-Dimer: No results for input(s): DDIMER in the last 72 hours. Hgb A1c: Recent Labs    08/22/21 1821  HGBA1C 6.1*   Lipid Profile: No results for input(s): CHOL, HDL, LDLCALC, TRIG, CHOLHDL, LDLDIRECT in the last 72 hours. Thyroid function studies: No results for input(s): TSH, T4TOTAL, T3FREE, THYROIDAB in the last 72 hours.  Invalid input(s): FREET3 Anemia work up: No results for input(s): VITAMINB12, FOLATE, FERRITIN, TIBC, IRON, RETICCTPCT in the last 72 hours. Sepsis Labs: Recent Labs  Lab 08/23/21 0006 08/23/21 0606 08/23/21 1602 08/24/21 0512  WBC 9.0 7.2 10.0 8.4    Microbiology Recent Results (from the past 240 hour(s))  Resp Panel by RT-PCR (Flu A&B, Covid) Nasopharyngeal Swab     Status: None   Collection Time: 08/22/21  5:26 PM   Specimen: Nasopharyngeal Swab; Nasopharyngeal(NP) swabs in vial transport medium  Result Value Ref Range Status   SARS Coronavirus 2 by RT PCR NEGATIVE NEGATIVE Final    Comment: (NOTE) SARS-CoV-2 target nucleic acids are NOT DETECTED.  The SARS-CoV-2 RNA is  generally detectable in upper respiratory specimens during the acute phase of infection. The lowest concentration of SARS-CoV-2 viral copies this assay can detect is 138 copies/mL. A negative result does not preclude SARS-Cov-2 infection and should not be used as the sole basis for treatment or other patient management decisions. A negative result may occur with  improper specimen collection/handling, submission of specimen other than nasopharyngeal swab, presence of viral mutation(s) within the areas targeted by this assay, and inadequate number of viral copies(<138 copies/mL). A negative result must be combined with clinical observations, patient history, and epidemiological information. The expected result is Negative.  Fact Sheet for Patients:  EntrepreneurPulse.com.au  Fact Sheet for Healthcare Providers:  IncredibleEmployment.be  This test is no t yet approved or cleared by the [place] FDA and  has been authorized for detection and/or diagnosis of SARS-CoV-2 by FDA under an Emergency Use Authorization (EUA). This EUA will remain  in effect (meaning this test can be used) for the duration of the COVID-19 declaration under Section 564(b)(1) of the Act, 21 U.S.C.section 360bbb-3(b)(1), unless the authorization is terminated  or revoked sooner.       Influenza A by PCR NEGATIVE NEGATIVE Final   Influenza B by PCR NEGATIVE NEGATIVE Final    Comment: (NOTE) The Xpert Xpress SARS-CoV-2/FLU/RSV plus assay is intended as an aid in the diagnosis of influenza from Nasopharyngeal swab specimens and should not be used as a sole basis for treatment. Nasal washings and aspirates are unacceptable for Xpert Xpress SARS-CoV-2/FLU/RSV testing.  Fact Sheet for Patients: EntrepreneurPulse.com.au  Fact Sheet for Healthcare Providers: IncredibleEmployment.be  This test is not yet approved or cleared by the [place] FDA and has been authorized for detection and/or diagnosis of SARS-CoV-2 by FDA under an Emergency Use Authorization (EUA). This EUA will remain in effect (meaning this test can be used) for the duration of the COVID-19 declaration under Section 564(b)(1) of the Act, 21 U.S.C. section 360bbb-3(b)(1), unless the authorization is terminated or revoked.  Performed at [facility], [address], [place], [place] [address]      Medications:    sodium chloride   Intravenous Once   carvedilol  6.25 mg Oral BID   dapagliflozin propanediol  10 mg Oral QAC breakfast   fluticasone furoate-vilanterol  1 puff Inhalation Daily   insulin aspart  0-15 Units Subcutaneous TID WC   insulin aspart  0-5  Units Subcutaneous QHS   ipratropium-albuterol  3 mL Nebulization Q6H   mirtazapine  15 mg Oral QHS   pantoprazole (PROTONIX) IV  40 mg Intravenous Q12H   predniSONE  20 mg Oral Q breakfast   sacubitril-valsartan  1 tablet Oral BID   tamsulosin  0.8 mg Oral BID   umeclidinium bromide  1 puff Inhalation Daily   Continuous Infusions:  azithromycin 500 mg (08/23/21 0804)   cefTRIAXone (ROCEPHIN)  IV 1 g (08/23/21 1227)      LOS: 2 days   Charlynne Cousins  [facility]  08/24/2021, 7:35 AM

## 2021-08-24 NOTE — Anesthesia Postprocedure Evaluation (Signed)
Anesthesia Post Note  Patient: [name]  Procedure(s) Performed: ESOPHAGOGASTRODUODENOSCOPY (EGD) WITH PROPOFOL FLEXIBLE SIGMOIDOSCOPY ESOPHAGEAL BRUSHING  Patient location during evaluation: Endoscopy Anesthesia Type: General Level of consciousness: awake and alert Pain management: pain level controlled Vital Signs Assessment: post-procedure vital signs reviewed and stable Respiratory status: spontaneous breathing, nonlabored ventilation, respiratory function stable and patient connected to nasal cannula oxygen Cardiovascular status: blood pressure returned to baseline and stable Postop Assessment: no apparent nausea or vomiting Anesthetic complications: no   No notable events documented.   Last Vitals:  Vitals:   08/24/21 1345 08/24/21 1353  BP: (!) 90/58   Pulse: 98 100  Resp: (!) 27 17  Temp:    SpO2: 100% 100%    Last Pain:  Vitals:   08/24/21 1339  TempSrc:   PainSc: 0-No pain                 [name]

## 2021-08-24 NOTE — Op Note (Signed)
[facility] Patient Name: Gregory Mitchell Procedure Date: 08/24/2021 1:06 PM MRN: [id number] Date of Birth: 09/10/1940 Attending MD: Elon Alas. Abbey Chatters DO CSN: [id number] Age: 81 Admit Type: Inpatient Procedure:                Upper GI endoscopy Indications:              Iron deficiency anemia, Dysphagia Providers:                Elon Alas. Abbey Chatters, DO, Hughie Closs, RN, Charlsie Quest.                            Theda Sers RN, RN, Caprice Kluver, Raphael Gibney,                            Technician Referring MD:              Medicines:                See the Anesthesia note for documentation of the                            administered medications Complications:            No immediate complications. Estimated Blood Loss:     Estimated blood loss was minimal. Procedure:                Pre-Anesthesia Assessment:                           - The anesthesia plan was to use monitored                            anesthesia care (MAC).                           After obtaining informed consent, the endoscope was                            passed under direct vision. Throughout the                            procedure, the patient's blood pressure, pulse, and                            oxygen saturations were monitored continuously. The                            [id number]-H190 ([id number]) scope was introduced through the                            mouth, and advanced to the second part of duodenum.                            The upper GI endoscopy was accomplished without                            difficulty. The patient  tolerated the procedure                            well. Scope In: 1:26:27 PM Scope Out: 1:30:35 PM Total Procedure Duration: 0 hours 4 minutes 8 seconds  Findings:      Moderately severe esophagitis with no bleeding was found in the entire       esophagus. Cells for cytology were obtained by brushing.      Localized mild inflammation characterized by erythema was found in the        gastric antrum. Biopsies were taken with a cold forceps for Helicobacter       pylori testing.      The duodenal bulb, first portion of the duodenum and second portion of       the duodenum were normal. Impression:               - Moderately severe candidiasis esophagitis with no                            bleeding. Cells for cytology obtained.                           - Gastritis. Biopsied.                           - Normal duodenal bulb, first portion of the                            duodenum and second portion of the duodenum. Moderate Sedation:      Per Anesthesia Care Recommendation:           - Return patient to hospital ward for ongoing care.                           - Soft diet.                           - Will treat with Diflucan x21 days, given                            recurrence of his candidal esophagitis, will send                            sample today for culture and sensitivity. Procedure Code(s):        --- Professional ---                           [phone], Esophagogastroduodenoscopy, flexible,                            transoral; with biopsy, single or multiple Diagnosis Code(s):        --- Professional ---                           B37.81, Candidal esophagitis  K29.70, Gastritis, unspecified, without bleeding                           D50.9, Iron deficiency anemia, unspecified                           R13.10, Dysphagia, unspecified CPT copyright 2019 American Medical Association. All rights reserved. The codes documented in this report are preliminary and upon coder review may  be revised to meet current compliance requirements. Elon Alas. Abbey Chatters, DO [place] Abbey Chatters, DO 08/24/2021 1:41:01 PM This report has been signed electronically. Number of Addenda: 0

## 2021-08-24 NOTE — Anesthesia Preprocedure Evaluation (Signed)
Anesthesia Evaluation  Patient identified by MRN, date of birth, ID band Patient awake    Reviewed: Allergy & Precautions, H&P , NPO status , Patient's Chart, lab work & pertinent test results, reviewed documented beta blocker date and time   Airway Mallampati: II  TM Distance: >3 FB Neck ROM: full    Dental  (+) Edentulous Upper, Edentulous Lower   Pulmonary asthma , COPD,  oxygen dependent, former smoker,    Pulmonary exam normal breath sounds clear to auscultation       Cardiovascular Exercise Tolerance: Good hypertension, +CHF  + dysrhythmias (trifascicular block)  Rhythm:regular Rate:Normal  ECHO EF 7/ 2022 30 -35%   Neuro/Psych negative neurological ROS  negative psych ROS   GI/Hepatic Neg liver ROS, GERD  ,  Endo/Other  negative endocrine ROSdiabetes  Renal/GU CRFRenal disease  negative genitourinary   Musculoskeletal negative musculoskeletal ROS (+)   Abdominal (+) - obese,   Peds  Hematology negative hematology ROS (+) anemia ,   Anesthesia Other Findings   Reproductive/Obstetrics negative OB ROS                             Anesthesia Physical  Anesthesia Plan  ASA: 4  Anesthesia Plan: General   Post-op Pain Management:    Induction: Intravenous  PONV Risk Score and Plan: TIVA  Airway Management Planned: Nasal Cannula  Additional Equipment:   Intra-op Plan:   Post-operative Plan:   Informed Consent: I have reviewed the patients History and Physical, chart, labs and discussed the procedure including the risks, benefits and alternatives for the proposed anesthesia with the patient or authorized representative who has indicated his/her understanding and acceptance.       Plan Discussed with: CRNA  Anesthesia Plan Comments:         Anesthesia Quick Evaluation

## 2021-08-25 ENCOUNTER — Other Ambulatory Visit: Payer: Self-pay | Admitting: *Deleted

## 2021-08-25 ENCOUNTER — Telehealth: Payer: Self-pay | Admitting: Gastroenterology

## 2021-08-25 DIAGNOSIS — D638 Anemia in other chronic diseases classified elsewhere: Secondary | ICD-10-CM

## 2021-08-25 DIAGNOSIS — E1122 Type 2 diabetes mellitus with diabetic chronic kidney disease: Secondary | ICD-10-CM

## 2021-08-25 LAB — RESPIRATORY PANEL BY PCR

## 2021-08-25 LAB — CBC
HCT: 26.3 % — ABNORMAL LOW (ref 39.0–52.0)
Hemoglobin: 7.6 g/dL — ABNORMAL LOW (ref 13.0–17.0)
MCH: 29.1 pg (ref 26.0–34.0)
MCHC: 28.9 g/dL — ABNORMAL LOW (ref 30.0–36.0)
MCV: 100.8 fL — ABNORMAL HIGH (ref 80.0–100.0)
Platelets: 335 10*3/uL (ref 150–400)
RBC: 2.61 MIL/uL — ABNORMAL LOW (ref 4.22–5.81)
RDW: 17.4 % — ABNORMAL HIGH (ref 11.5–15.5)
WBC: 7.1 10*3/uL (ref 4.0–10.5)
nRBC: 0 % (ref 0.0–0.2)

## 2021-08-25 LAB — GLUCOSE, CAPILLARY
Glucose-Capillary: 102 mg/dL — ABNORMAL HIGH (ref 70–99)
Glucose-Capillary: 115 mg/dL — ABNORMAL HIGH (ref 70–99)

## 2021-08-25 LAB — STREP PNEUMONIAE URINARY ANTIGEN: Strep Pneumo Urinary Antigen: NEGATIVE

## 2021-08-25 LAB — HIV ANTIBODY (ROUTINE TESTING W REFLEX): HIV Screen 4th Generation wRfx: NONREACTIVE

## 2021-08-25 MED ORDER — AZITHROMYCIN 500 MG PO TABS: 500.0000 mg | ORAL_TABLET | Freq: Every day | ORAL | 0 refills | Status: AC

## 2021-08-25 MED ORDER — FLUCONAZOLE 200 MG PO TABS: 200.0000 mg | ORAL_TABLET | Freq: Every day | ORAL | 0 refills | Status: AC

## 2021-08-25 MED ORDER — FERROUS SULFATE 325 (65 FE) MG PO TABS: 325.0000 mg | ORAL_TABLET | Freq: Every day | ORAL | 3 refills | Status: DC

## 2021-08-25 NOTE — TOC Transition Note (Signed)
Transition of Care [facility]) - CM/SW Discharge Note   Patient Details  Name: Gregory Mitchell MRN: [id number] Date of Birth: 12-Sep-1940  [facility] CM/SW Contact:  Shade Flood, LCSW Phone Number: 08/25/2021, 9:43 AM   Clinical Narrative:     Pt with orders for dc home today. HH resumption orders placed. Updated [name] at [employer] (sister [place]) and she will pull orders for resumption of care. Information added to the AVS.   Anticipating family will transport pt home. No other TOC needs identified for dc.  Final next level of care: [place] Barriers to Discharge: Barriers Resolved   Patient Goals and CMS Choice Patient states their goals for this hospitalization and ongoing recovery are:: To return home      Discharge Placement                       Discharge Plan and Services                                     Social Determinants of Health (SDOH) Interventions     Readmission Risk Interventions Readmission Risk Prevention Plan 08/23/2021 10/06/2020  Transportation Screening Complete Complete  PCP or Specialist Appt within 5-7 Days - Complete  PCP or Specialist Appt within 3-5 Days Complete -  Home Care Screening - Complete  Medication Review (RN CM) - Complete  HRI or Home Care Consult Complete -  Social Work Consult for [place] Planning/Counseling Complete -  Palliative Care Screening Not Applicable -  Medication Review Press photographer) Complete -  Some recent data might be hidden

## 2021-08-25 NOTE — Progress Notes (Signed)
Patient is stable no s/s of bleeding at this time and no c/o pain. Patient's appetite has increased, patient able to tolerate jello and applesauce.

## 2021-08-25 NOTE — Telephone Encounter (Signed)
Patient is getting discharged from hospital today.  We saw him for acute on chronic anemia with melena.   We need to arrange for repeat CBC in 1 week and follow-up in 2-4 weeks with Dr. Abbey Chatters or an available APP.

## 2021-08-25 NOTE — Telephone Encounter (Signed)
Spoke to pt's daughter Apolonio Schneiders. Pt was in the car listening to the phone call. Informed them to have labs drawn in a week and reminded them of up coming OV. She voiced understanding. Labs entered into [employer]

## 2021-08-25 NOTE — Discharge Summary (Addendum)
Physician Discharge Summary  Gregory Mitchell GYF:[id number] DOB: 1941-05-25 DOA: 08/22/2021  PCP: Neale Burly, MD  Admit date: 08/22/2021 Discharge date: 08/25/2021  Admitted From: Home Disposition:  Home  Recommendations for Outpatient Follow-up:  Follow up with PCP in 1-2 weeks Please obtain BMP/CBC in one week   Home Health:No Equipment/Devices:None  Discharge Condition:Stable CODE STATUS:Full Diet recommendation: Heart Healthy  Brief/Interim Summary: 81 y.o. male past medical history of combined systolic and diastolic heart failure, essential hypertension pulmonary embolism on apixaban [place] mellitus type II, chronic kidney disease stage IIIb comes in for shortness of breath, productive thick sputum from baseline.  He relates his pain is worse with inspiration has been having melanotic stools for the last couple of weeks.  Hemoglobin in the ED is 8.2 (baseline 10.3), creatinine 1.2  Discharge Diagnoses:  Principal Problem:   Symptomatic anemia Active Problems:   Type 2 diabetes mellitus (HCC)   HTN (hypertension)   Chronic kidney disease, stage 3a (HCC)   Chronic combined systolic and diastolic CHF (congestive heart failure) (HCC)   COPD with acute exacerbation (HCC)   History of pulmonary embolism   Community acquired pneumonia   Gastrointestinal hemorrhage  Acute blood loss anemia/symptomatic anemia/acute GI bleed: Eliquis was held on admission He was transfused 1 unit of packed red blood cells on admission his hemoglobin was 8.2 and his hemoglobin stabilized around 7.6. He was also fluid resuscitated GI was consulted recommended an EGD and flexible endoscopy EGD severe candidiasis for which she was started on Diflucan see below for further details no ulcers. Eliquis probably contributing to her acute blood loss anemia Flex sigmoidoscopy showed a sacral decubitus ulcer found on the perianal exam. He was started on ferrous sulfate she will continue as an  outpatient.    follow-up with GI in 2 to 4 weeks.  Possible acute COPD exacerbation superimposed on community-acquired pneumonia: Had no fever no productive cough pressure and shortness of breath chest x-ray showed an infiltrate he was on Rocephin and azithromycin and steroids he is wheezing resolved physical exam he was weaned to room air. Antibiotics were discontinued and the wheezing was resolved. He will continue azithromycin as an outpatient.  History of PE: Eliquis was held due to GI bleed, placed on SCDs he will resume Eliquis as an outpatient.  Combined systolic and diastolic heart failure chronic centimeters to be compensated no change made to his medication continue current regimen.  Diabetes mellitus type 2: Continue current medications no changes made. Essential hypertension: No changes made to his medication continue current regimen.  Sacral decubitus ulcer present on admission: Discharge Instructions  Discharge Instructions     Diet - low sodium heart healthy   Complete by: As directed    Increase activity slowly   Complete by: As directed       Allergies as of 08/25/2021       Reactions   Amoxicillin Other (See Comments)   Makes eyes turn yellow and burn   Ciprofloxacin    Pt not sure   Sulfa Antibiotics Rash   Pt not sure of reaction        Medication List     TAKE these medications    albuterol 108 (90 Base) MCG/ACT inhaler Commonly known as: VENTOLIN HFA Inhale 2 puffs into the lungs every 6 (six) hours as needed for wheezing or shortness of breath.   albuterol (5 MG/ML) 0.5% nebulizer solution Commonly known as: PROVENTIL Inhale into the lungs.   azithromycin 500 MG tablet  Commonly known as: Zithromax Take 1 tablet (500 mg total) by mouth daily for 3 days. Take 1 tablet daily for 3 days.   carvedilol 6.25 MG tablet Commonly known as: COREG Take 1 tablet (6.25 mg total) by mouth 2 (two) times daily.   dapagliflozin propanediol 10 MG  Tabs tablet Commonly known as: Farxiga Take 1 tablet (10 mg total) by mouth daily before breakfast.   diclofenac Sodium 1 % Gel Commonly known as: VOLTAREN Apply 1 application topically 4 (four) times daily as needed (pain).   Eliquis 5 MG Tabs tablet Generic drug: apixaban Take 5 mg by mouth 2 (two) times daily.   Entresto 24-26 MG Generic drug: sacubitril-valsartan Take 1 tablet by mouth 2 (two) times daily.   Enulose 10 GM/15ML Soln Generic drug: lactulose (encephalopathy) Take by mouth daily as needed (constipation).   esomeprazole 40 MG capsule Commonly known as: NexIUM Take 1 capsule (40 mg total) by mouth 2 (two) times daily before a meal.   ferrous sulfate 325 (65 FE) MG tablet Take 1 tablet (325 mg total) by mouth daily.   fluconazole 200 MG tablet Commonly known as: DIFLUCAN Take 1 tablet (200 mg total) by mouth daily for 20 days.   guaiFENesin 100 MG/5ML liquid Commonly known as: ROBITUSSIN Take by mouth.   HM Senna-S 8.6-50 MG tablet Generic drug: senna-docusate Take 2 tablets by mouth daily.   HYDROcodone-acetaminophen 7.5-325 MG tablet Commonly known as: NORCO Take 1 tablet by mouth 2 (two) times daily as needed.   ipratropium-albuterol 0.5-2.5 (3) MG/3ML Soln Commonly known as: DUONEB Take 3 mLs by nebulization every 6 (six) hours as needed (for shortness of breath/wheezing).   mirtazapine 15 MG tablet Commonly known as: REMERON Take 15 mg by mouth at bedtime.   OXYGEN Inhale 4 L into the lungs at bedtime as needed (for shortness of breath).   SM Senna Laxative 8.6 MG tablet Generic drug: senna Take 2 tablets by mouth 2 (two) times daily.   tamsulosin 0.4 MG Caps capsule Commonly known as: FLOMAX Take 0.8 mg by mouth 2 (two) times daily.   torsemide 20 MG tablet Commonly known as: DEMADEX Take 1 tablet (20 mg total) by mouth 2 (two) times daily.   Trelegy Ellipta 200-62.5-25 MCG/ACT Aepb Generic drug: Fluticasone-Umeclidin-Vilant Take  1 puff by mouth daily.        Allergies  Allergen Reactions   Amoxicillin Other (See Comments)    Makes eyes turn yellow and burn   Ciprofloxacin     Pt not sure   Sulfa Antibiotics Rash    Pt not sure of reaction    Consultations: Gastroenterology   Procedures/Studies: DG Chest 2 View  Result Date: 08/22/2021 CLINICAL DATA:  Pt c/o SOB and chest pain for several days, pt also c/o cough. Hx of asthma, COPD, and HTN. EXAM: CHEST - 2 VIEW COMPARISON:  None. FINDINGS: Cardiac silhouette is normal in size. No mediastinal or hilar masses. No evidence of adenopathy. There is streaky opacity at both medial lung bases that has the appearance of bronchial wall thickening and peribronchovascular opacities as well as interstitial thickening. This is greater on the left, and is similar to recent prior chest radiographs as well as a chest CT from 07/28/2021. Remainder of the lungs is clear. Trace pleural effusions.  No pneumothorax. Skeletal structures are grossly intact. IMPRESSION: 1. Left greater than right lung base opacities similar to recent prior studies. Suspect chronic bronchitis and more acute bronchopneumonia. Electronically Signed   By: Shanon Brow  Ormond M.D.   On: 08/22/2021 13:31     Subjective: No complaints  Discharge Exam: Vitals:   08/25/21 0723 08/25/21 0808  BP:  120/67  Pulse:  90  Resp:  16  Temp:    SpO2: 98% 98%   Vitals:   08/24/21 2059 08/25/21 0527 08/25/21 0723 08/25/21 0808  BP: (!) 110/54 109/67  120/67  Pulse: 72 93  90  Resp: 17 17  16   Temp: 98.7 F (37.1 C) 99.3 F (37.4 C)    TempSrc: Oral Oral    SpO2: 99% 98% 98% 98%  Weight:      Height:        General: Pt is alert, awake, not in acute distress Cardiovascular: RRR, S1/S2 +, no rubs, no gallops Respiratory: CTA bilaterally, no wheezing, no rhonchi Abdominal: Soft, NT, ND, bowel sounds + Extremities: no edema, no cyanosis    The results of significant diagnostics from this hospitalization  (including imaging, microbiology, ancillary and laboratory) are listed below for reference.     Microbiology: Recent Results (from the past 240 hour(s))  Resp Panel by RT-PCR (Flu A&B, Covid) Nasopharyngeal Swab     Status: None   Collection Time: 08/22/21  5:26 PM   Specimen: Nasopharyngeal Swab; Nasopharyngeal(NP) swabs in vial transport medium  Result Value Ref Range Status   SARS Coronavirus 2 by RT PCR NEGATIVE NEGATIVE Final    Comment: (NOTE) SARS-CoV-2 target nucleic acids are NOT DETECTED.  The SARS-CoV-2 RNA is generally detectable in upper respiratory specimens during the acute phase of infection. The lowest concentration of SARS-CoV-2 viral copies this assay can detect is 138 copies/mL. A negative result does not preclude SARS-Cov-2 infection and should not be used as the sole basis for treatment or other patient management decisions. A negative result may occur with  improper specimen collection/handling, submission of specimen other than nasopharyngeal swab, presence of viral mutation(s) within the areas targeted by this assay, and inadequate number of viral copies(<138 copies/mL). A negative result must be combined with clinical observations, patient history, and epidemiological information. The expected result is Negative.  Fact Sheet for Patients:  EntrepreneurPulse.com.au  Fact Sheet for Healthcare Providers:  IncredibleEmployment.be  This test is no t yet approved or cleared by the [place] FDA and  has been authorized for detection and/or diagnosis of SARS-CoV-2 by FDA under an Emergency Use Authorization (EUA). This EUA will remain  in effect (meaning this test can be used) for the duration of the COVID-19 declaration under Section 564(b)(1) of the Act, 21 U.S.C.section 360bbb-3(b)(1), unless the authorization is terminated  or revoked sooner.       Influenza A by PCR NEGATIVE NEGATIVE Final   Influenza B by PCR  NEGATIVE NEGATIVE Final    Comment: (NOTE) The Xpert Xpress SARS-CoV-2/FLU/RSV plus assay is intended as an aid in the diagnosis of influenza from Nasopharyngeal swab specimens and should not be used as a sole basis for treatment. Nasal washings and aspirates are unacceptable for Xpert Xpress SARS-CoV-2/FLU/RSV testing.  Fact Sheet for Patients: EntrepreneurPulse.com.au  Fact Sheet for Healthcare Providers: IncredibleEmployment.be  This test is not yet approved or cleared by the [place] FDA and has been authorized for detection and/or diagnosis of SARS-CoV-2 by FDA under an Emergency Use Authorization (EUA). This EUA will remain in effect (meaning this test can be used) for the duration of the COVID-19 declaration under Section 564(b)(1) of the Act, 21 U.S.C. section 360bbb-3(b)(1), unless the authorization is terminated or revoked.  Performed at  [facility], [address], [place], [place] [address]   KOH prep     Status: None   Collection Time: 08/24/21  1:27 PM   Specimen: PATH GI Other  Result Value Ref Range Status   Specimen Description ESOPHAGUS  Final   Special Requests NONE  Final   KOH Prep   Final    FEW YEAST Performed at [facility], [address], [place], [place] [address]    Report Status 08/24/2021 FINAL  Final     Labs: BNP (last 3 results) Recent Labs    10/04/20 1602 08/22/21 1309  BNP 1,309.0* 557.3*   Basic Metabolic Panel: Recent Labs  Lab 08/22/21 1309 08/23/21 0606  NA 141 141  K 3.8 4.5  CL 112* 111  CO2 26 25  GLUCOSE 94 181*  BUN 26* 26*  CREATININE 1.29* 1.11  CALCIUM 7.8* 8.0*   Liver Function Tests: No results for input(s): AST, ALT, ALKPHOS, BILITOT, PROT, ALBUMIN in the last 168 hours. No results for input(s): LIPASE, AMYLASE in the last 168 hours. No results for input(s): AMMONIA in the last 168 hours. CBC: Recent Labs  Lab 08/23/21 0606 08/23/21 1602 08/24/21 0512  08/24/21 1808 08/25/21 0519  WBC 7.2 10.0 8.4 8.7 7.1  HGB 8.5* 8.3* 7.7* 9.2* 7.6*  HCT 29.5* 28.5* 26.4* 31.4* 26.3*  MCV 101.7* 100.4* 101.1* 100.3* 100.8*  PLT 382 381 353 401* 335   Cardiac Enzymes: No results for input(s): CKTOTAL, CKMB, CKMBINDEX, TROPONINI in the last 168 hours. BNP: Invalid input(s): POCBNP CBG: Recent Labs  Lab 08/24/21 1116 08/24/21 1353 08/24/21 1610 08/24/21 2115 08/25/21 0714  GLUCAP 136* 129* 144* 174* 102*   D-Dimer No results for input(s): DDIMER in the last 72 hours. Hgb A1c Recent Labs    08/22/21 1821  HGBA1C 6.1*   Lipid Profile No results for input(s): CHOL, HDL, LDLCALC, TRIG, CHOLHDL, LDLDIRECT in the last 72 hours. Thyroid function studies No results for input(s): TSH, T4TOTAL, T3FREE, THYROIDAB in the last 72 hours.  Invalid input(s): FREET3 Anemia work up No results for input(s): VITAMINB12, FOLATE, FERRITIN, TIBC, IRON, RETICCTPCT in the last 72 hours. Urinalysis    Component Value Date/Time   COLORURINE STRAW (A) 11/15/2020 1909   APPEARANCEUR CLEAR 11/15/2020 1909   LABSPEC 1.008 11/15/2020 1909   PHURINE 6.0 11/15/2020 1909   GLUCOSEU 50 (A) 11/15/2020 [place] NEGATIVE 11/15/2020 [place] NEGATIVE 11/15/2020 [place] NEGATIVE 11/15/2020 1909   PROTEINUR NEGATIVE 11/15/2020 1909   NITRITE NEGATIVE 11/15/2020 1909   LEUKOCYTESUR NEGATIVE 11/15/2020 1909   Sepsis Labs Invalid input(s): PROCALCITONIN,  WBC,  LACTICIDVEN Microbiology Recent Results (from the past 240 hour(s))  Resp Panel by RT-PCR (Flu A&B, Covid) Nasopharyngeal Swab     Status: None   Collection Time: 08/22/21  5:26 PM   Specimen: Nasopharyngeal Swab; Nasopharyngeal(NP) swabs in vial transport medium  Result Value Ref Range Status   SARS Coronavirus 2 by RT PCR NEGATIVE NEGATIVE Final    Comment: (NOTE) SARS-CoV-2 target nucleic acids are NOT DETECTED.  The SARS-CoV-2 RNA is generally detectable in upper  respiratory specimens during the acute phase of infection. The lowest concentration of SARS-CoV-2 viral copies this assay can detect is 138 copies/mL. A negative result does not preclude SARS-Cov-2 infection and should not be used as the sole basis for treatment or other patient management decisions. A negative result may occur with  improper specimen collection/handling, submission of specimen other than nasopharyngeal swab,  presence of viral mutation(s) within the areas targeted by this assay, and inadequate number of viral copies(<138 copies/mL). A negative result must be combined with clinical observations, patient history, and epidemiological information. The expected result is Negative.  Fact Sheet for Patients:  EntrepreneurPulse.com.au  Fact Sheet for Healthcare Providers:  IncredibleEmployment.be  This test is no t yet approved or cleared by the [place] FDA and  has been authorized for detection and/or diagnosis of SARS-CoV-2 by FDA under an Emergency Use Authorization (EUA). This EUA will remain  in effect (meaning this test can be used) for the duration of the COVID-19 declaration under Section 564(b)(1) of the Act, 21 U.S.C.section 360bbb-3(b)(1), unless the authorization is terminated  or revoked sooner.       Influenza A by PCR NEGATIVE NEGATIVE Final   Influenza B by PCR NEGATIVE NEGATIVE Final    Comment: (NOTE) The Xpert Xpress SARS-CoV-2/FLU/RSV plus assay is intended as an aid in the diagnosis of influenza from Nasopharyngeal swab specimens and should not be used as a sole basis for treatment. Nasal washings and aspirates are unacceptable for Xpert Xpress SARS-CoV-2/FLU/RSV testing.  Fact Sheet for Patients: EntrepreneurPulse.com.au  Fact Sheet for Healthcare Providers: IncredibleEmployment.be  This test is not yet approved or cleared by the [place] FDA and has been  authorized for detection and/or diagnosis of SARS-CoV-2 by FDA under an Emergency Use Authorization (EUA). This EUA will remain in effect (meaning this test can be used) for the duration of the COVID-19 declaration under Section 564(b)(1) of the Act, 21 U.S.C. section 360bbb-3(b)(1), unless the authorization is terminated or revoked.  Performed at [facility], [address] [place], [place] [address]   [place] prep     Status: None   Collection Time: 08/24/21  1:27 PM   Specimen: PATH GI Other  Result Value Ref Range Status   Specimen Description ESOPHAGUS  Final   Special Requests NONE  Final   KOH Prep   Final    FEW YEAST Performed at [facility], [address], [place], [place] [address]    Report Status 08/24/2021 FINAL  Final   SIGNED:   Charlynne Cousins, MD  [facility] 08/25/2021, 8:26 AM Pager   If 7PM-7AM, please contact night-coverage [email or web address] Password [email or web address]

## 2021-08-25 NOTE — Progress Notes (Addendum)
Saw patient briefly this morning as he has been discharged.  He has not had any further overt bleeding.  States his last episode of melena was a day prior to presenting to the emergency room.  He had 2 bowel movements yesterday that were brown.  Denies abdominal pain, nausea, vomiting.  He did report some trouble with constipation and was asking what he could take for this.  He also told me that he has been taking ibuprofen every couple of days and BC powders at home.  Hemoglobin fairly stable today at 7.6.  Hemoglobin was 7.7 yesterday.  Repeat CBC yesterday with hemoglobin of 9.2, but this was an outlier as his hemoglobin has not been in the 9 range this admission.  Recommendations: Avoid all NSAID products.  He was counseled on this and I provided him with written instructions. Monitor for overt GI bleeding.  Advised patient to call our office or return to the hospital if he has return of black stools, feels lightheaded, weak, or like he would pass out. Complete course of Diflucan. I will have our office arrange for repeat CBC in 1 week and for follow-up in the near future. Recommended MiraLAX 1 capful (17 g) daily in 8 ounces of water to manage constipation.  Reduce frequency of MiraLAX if stools become loose.   Aliene Altes, PA-C [facility] 08/22/2021

## 2021-08-25 NOTE — Telephone Encounter (Signed)
Noted  

## 2021-08-26 ENCOUNTER — Encounter (HOSPITAL_COMMUNITY): Payer: Self-pay | Admitting: Internal Medicine

## 2021-08-26 LAB — TYPE AND SCREEN
ABO/RH(D): O POS
Antibody Screen: NEGATIVE
Unit division: 0

## 2021-08-26 LAB — LEGIONELLA PNEUMOPHILA SEROGP 1 UR AG: L. pneumophila Serogp 1 Ur Ag: NEGATIVE

## 2021-08-26 LAB — BPAM RBC
Blood Product Expiration Date: 202302082359
Unit Type and Rh: 5100

## 2021-09-06 NOTE — Telephone Encounter (Signed)
Gregory Mitchell, please call to remind patient to have blood work completed.

## 2021-09-07 NOTE — Telephone Encounter (Signed)
Spoke to pt's daughter Apolonio Schneiders [facility]), she informed me that they are going to have blood work done tomorrow 09/08/2021 at [facility] in [place].

## 2021-09-07 NOTE — Telephone Encounter (Signed)
LMOM for pt to call office back 

## 2021-09-07 NOTE — Telephone Encounter (Signed)
Noted  

## 2021-09-16 LAB — CULTURE, FUNGUS WITHOUT SMEAR

## 2021-09-17 ENCOUNTER — Other Ambulatory Visit: Payer: Self-pay | Admitting: Gastroenterology

## 2021-09-17 DIAGNOSIS — D638 Anemia in other chronic diseases classified elsewhere: Secondary | ICD-10-CM

## 2021-09-17 LAB — CBC WITH DIFFERENTIAL/PLATELET
Basophils Absolute: 0 10*3/uL (ref 0.0–0.2)
Basos: 0 %
EOS (ABSOLUTE): 0.1 10*3/uL (ref 0.0–0.4)
Eos: 2 %
Hematocrit: 24.5 % — ABNORMAL LOW (ref 37.5–51.0)
Hemoglobin: 7.7 g/dL — ABNORMAL LOW (ref 13.0–17.7)
Immature Grans (Abs): 0 10*3/uL (ref 0.0–0.1)
Immature Granulocytes: 0 %
Lymphocytes Absolute: 2.8 10*3/uL (ref 0.7–3.1)
Lymphs: 59 %
MCH: 28.3 pg (ref 26.6–33.0)
MCHC: 31.4 g/dL — ABNORMAL LOW (ref 31.5–35.7)
MCV: 90 fL (ref 79–97)
Monocytes Absolute: 0.3 10*3/uL (ref 0.1–0.9)
Monocytes: 7 %
Neutrophils Absolute: 1.5 10*3/uL (ref 1.4–7.0)
Neutrophils: 32 %
Platelets: 397 10*3/uL (ref 150–450)
RBC: 2.72 x10E6/uL — CL (ref 4.14–5.80)
RDW: 14.8 % (ref 11.6–15.4)
WBC: 4.8 10*3/uL (ref 3.4–10.8)

## 2021-09-17 MED ORDER — FERROUS SULFATE 325 (65 FE) MG PO TABS: 325.0000 mg | ORAL_TABLET | Freq: Every day | ORAL | 3 refills | Status: DC

## 2021-09-21 ENCOUNTER — Emergency Department (HOSPITAL_COMMUNITY): Payer: Medicare HMO

## 2021-09-21 ENCOUNTER — Encounter (HOSPITAL_COMMUNITY): Payer: Self-pay | Admitting: *Deleted

## 2021-09-21 ENCOUNTER — Observation Stay (HOSPITAL_COMMUNITY)
Admission: EM | Admit: 2021-09-21 | Discharge: 2021-09-23 | Disposition: A | Discharge status: Home / Self Care | Payer: Medicare HMO | Attending: Family Medicine | Admitting: Family Medicine

## 2021-09-21 DIAGNOSIS — D539 Nutritional anemia, unspecified: Secondary | ICD-10-CM

## 2021-09-21 DIAGNOSIS — N1831 Chronic kidney disease, stage 3a: Secondary | ICD-10-CM | POA: Insufficient documentation

## 2021-09-21 DIAGNOSIS — I2699 Other pulmonary embolism without acute cor pulmonale: Secondary | ICD-10-CM

## 2021-09-21 DIAGNOSIS — K219 Gastro-esophageal reflux disease without esophagitis: Secondary | ICD-10-CM | POA: Insufficient documentation

## 2021-09-21 DIAGNOSIS — I5043 Acute on chronic combined systolic (congestive) and diastolic (congestive) heart failure: Secondary | ICD-10-CM | POA: Diagnosis not present

## 2021-09-21 DIAGNOSIS — J441 Chronic obstructive pulmonary disease with (acute) exacerbation: Secondary | ICD-10-CM | POA: Diagnosis not present

## 2021-09-21 DIAGNOSIS — I130 Hypertensive heart and chronic kidney disease with heart failure and stage 1 through stage 4 chronic kidney disease, or unspecified chronic kidney disease: Secondary | ICD-10-CM | POA: Insufficient documentation

## 2021-09-21 DIAGNOSIS — R0789 Other chest pain: Secondary | ICD-10-CM | POA: Diagnosis not present

## 2021-09-21 DIAGNOSIS — K5900 Constipation, unspecified: Secondary | ICD-10-CM

## 2021-09-21 DIAGNOSIS — R778 Other specified abnormalities of plasma proteins: Secondary | ICD-10-CM | POA: Diagnosis not present

## 2021-09-21 DIAGNOSIS — J449 Chronic obstructive pulmonary disease, unspecified: Secondary | ICD-10-CM | POA: Diagnosis present

## 2021-09-21 DIAGNOSIS — D500 Iron deficiency anemia secondary to blood loss (chronic): Secondary | ICD-10-CM | POA: Diagnosis not present

## 2021-09-21 DIAGNOSIS — Z7901 Long term (current) use of anticoagulants: Secondary | ICD-10-CM | POA: Insufficient documentation

## 2021-09-21 DIAGNOSIS — E119 Type 2 diabetes mellitus without complications: Secondary | ICD-10-CM

## 2021-09-21 DIAGNOSIS — R072 Precordial pain: Secondary | ICD-10-CM | POA: Diagnosis present

## 2021-09-21 DIAGNOSIS — J209 Acute bronchitis, unspecified: Secondary | ICD-10-CM | POA: Diagnosis not present

## 2021-09-21 DIAGNOSIS — Z20822 Contact with and (suspected) exposure to covid-19: Secondary | ICD-10-CM | POA: Insufficient documentation

## 2021-09-21 DIAGNOSIS — E1122 Type 2 diabetes mellitus with diabetic chronic kidney disease: Secondary | ICD-10-CM | POA: Insufficient documentation

## 2021-09-21 DIAGNOSIS — I10 Essential (primary) hypertension: Secondary | ICD-10-CM

## 2021-09-21 DIAGNOSIS — Z87891 Personal history of nicotine dependence: Secondary | ICD-10-CM | POA: Diagnosis not present

## 2021-09-21 DIAGNOSIS — R9431 Abnormal electrocardiogram [ECG] [EKG]: Secondary | ICD-10-CM

## 2021-09-21 DIAGNOSIS — E1100 Type 2 diabetes mellitus with hyperosmolarity without nonketotic hyperglycemic-hyperosmolar coma (NKHHC): Secondary | ICD-10-CM

## 2021-09-21 LAB — TROPONIN I (HIGH SENSITIVITY)
Troponin I (High Sensitivity): 19 ng/L — ABNORMAL HIGH (ref <18)
Troponin I (High Sensitivity): 18 ng/L — ABNORMAL HIGH (ref <18)

## 2021-09-21 LAB — RESP PANEL BY RT-PCR (FLU A&B, COVID) ARPGX2
Influenza A by PCR: NEGATIVE
Influenza B by PCR: NEGATIVE
SARS Coronavirus 2 by RT PCR: NEGATIVE

## 2021-09-21 LAB — BASIC METABOLIC PANEL
Anion gap: 9 (ref 5–15)
BUN: 25 mg/dL — ABNORMAL HIGH (ref 8–23)
CO2: 26 mmol/L (ref 22–32)
Calcium: 8.9 mg/dL (ref 8.9–10.3)
Chloride: 111 mmol/L (ref 98–111)
Creatinine, Ser: 1.52 mg/dL — ABNORMAL HIGH (ref 0.61–1.24)
GFR, Estimated: 46 mL/min — ABNORMAL LOW (ref >60)
Glucose, Bld: 92 mg/dL (ref 70–99)
Potassium: 4.3 mmol/L (ref 3.5–5.1)
Sodium: 146 mmol/L — ABNORMAL HIGH (ref 135–145)

## 2021-09-21 LAB — CBC
HCT: 28.8 % — ABNORMAL LOW (ref 39.0–52.0)
Hemoglobin: 7.7 g/dL — ABNORMAL LOW (ref 13.0–17.0)
MCH: 27.9 pg (ref 26.0–34.0)
MCHC: 26.7 g/dL — ABNORMAL LOW (ref 30.0–36.0)
MCV: 104.3 fL — ABNORMAL HIGH (ref 80.0–100.0)
Platelets: 385 10*3/uL (ref 150–400)
RBC: 2.76 MIL/uL — ABNORMAL LOW (ref 4.22–5.81)
RDW: 16.9 % — ABNORMAL HIGH (ref 11.5–15.5)
WBC: 4.5 10*3/uL (ref 4.0–10.5)
nRBC: 0 % (ref 0.0–0.2)

## 2021-09-21 MED ORDER — IPRATROPIUM-ALBUTEROL 0.5-2.5 (3) MG/3ML IN SOLN: 3.0000 mL | Freq: Four times a day (QID) | RESPIRATORY_TRACT | Status: DC

## 2021-09-21 MED ORDER — ACETAMINOPHEN 325 MG PO TABS: 650.0000 mg | ORAL_TABLET | Freq: Four times a day (QID) | ORAL | Status: DC | PRN

## 2021-09-21 MED ORDER — ACETAMINOPHEN 650 MG RE SUPP: 650.0000 mg | Freq: Four times a day (QID) | RECTAL | Status: DC | PRN

## 2021-09-21 MED ORDER — IPRATROPIUM-ALBUTEROL 0.5-2.5 (3) MG/3ML IN SOLN
3.0000 mL | RESPIRATORY_TRACT | Status: DC | PRN
  Administered 2021-09-21: 3 mL via RESPIRATORY_TRACT

## 2021-09-21 MED ORDER — METHYLPREDNISOLONE SODIUM SUCC 40 MG IJ SOLR
40.0000 mg | Freq: Two times a day (BID) | INTRAMUSCULAR | Status: DC
  Administered 2021-09-21 – 2021-09-23 (×4): 40 mg via INTRAVENOUS
  Filled 2021-09-21 (×4): qty 1

## 2021-09-21 MED ORDER — INSULIN ASPART 100 UNIT/ML IJ SOLN: 0.0000 [IU] | Freq: Three times a day (TID) | INTRAMUSCULAR | Status: DC

## 2021-09-21 MED ORDER — UMECLIDINIUM BROMIDE 62.5 MCG/ACT IN AEPB
1.0000 | INHALATION_SPRAY | Freq: Every day | RESPIRATORY_TRACT | Status: DC
  Administered 2021-09-22 – 2021-09-23 (×2): 1 via RESPIRATORY_TRACT
  Filled 2021-09-21: qty 7

## 2021-09-21 MED ORDER — IPRATROPIUM-ALBUTEROL 0.5-2.5 (3) MG/3ML IN SOLN
RESPIRATORY_TRACT | Status: AC
  Filled 2021-09-21: qty 3

## 2021-09-21 MED ORDER — INSULIN ASPART 100 UNIT/ML IJ SOLN: 0.0000 [IU] | Freq: Every day | INTRAMUSCULAR | Status: DC

## 2021-09-21 MED ORDER — DM-GUAIFENESIN ER 30-600 MG PO TB12
1.0000 | ORAL_TABLET | Freq: Two times a day (BID) | ORAL | Status: DC
  Administered 2021-09-21 – 2021-09-23 (×4): 1 via ORAL
  Filled 2021-09-21 (×8): qty 1

## 2021-09-21 MED ORDER — IPRATROPIUM-ALBUTEROL 0.5-2.5 (3) MG/3ML IN SOLN
3.0000 mL | Freq: Three times a day (TID) | RESPIRATORY_TRACT | Status: DC
  Administered 2021-09-22 – 2021-09-23 (×5): 3 mL via RESPIRATORY_TRACT
  Filled 2021-09-21 (×5): qty 3

## 2021-09-21 MED ORDER — APIXABAN 5 MG PO TABS
5.0000 mg | ORAL_TABLET | Freq: Two times a day (BID) | ORAL | Status: DC
  Administered 2021-09-22: 5 mg via ORAL
  Filled 2021-09-21: qty 1

## 2021-09-21 MED ORDER — OXYCODONE-ACETAMINOPHEN 5-325 MG PO TABS
1.0000 | ORAL_TABLET | Freq: Once | ORAL | Status: AC
  Administered 2021-09-21: 1 via ORAL
  Filled 2021-09-21: qty 1

## 2021-09-21 MED ORDER — ALBUTEROL SULFATE HFA 108 (90 BASE) MCG/ACT IN AERS: 2.0000 | INHALATION_SPRAY | Freq: Four times a day (QID) | RESPIRATORY_TRACT | Status: DC | PRN

## 2021-09-21 MED ORDER — PANTOPRAZOLE SODIUM 40 MG PO TBEC
40.0000 mg | DELAYED_RELEASE_TABLET | Freq: Every day | ORAL | Status: DC
  Administered 2021-09-22 – 2021-09-23 (×2): 40 mg via ORAL
  Filled 2021-09-21 (×2): qty 1

## 2021-09-21 MED ORDER — FLUTICASONE FUROATE-VILANTEROL 200-25 MCG/ACT IN AEPB
1.0000 | INHALATION_SPRAY | Freq: Every day | RESPIRATORY_TRACT | Status: DC
  Administered 2021-09-22 – 2021-09-23 (×2): 1 via RESPIRATORY_TRACT
  Filled 2021-09-21: qty 28

## 2021-09-21 NOTE — ED Triage Notes (Signed)
Chest pain x 2 days

## 2021-09-21 NOTE — Assessment & Plan Note (Signed)
Continue Protonix °

## 2021-09-21 NOTE — Assessment & Plan Note (Signed)
Controlled.  BP meds will be held at this time due to soft BP

## 2021-09-21 NOTE — Assessment & Plan Note (Addendum)
Patient's chest pain may be due to multifactorial including reflux, he was recently treated for esophageal candidiasis. Chest x-ray was suggestive of atelectasis or infiltrate and was suspicious for bronchitis  Continue duo nebs, Mucinex, Solu-Medrol. Continue Protonix to prevent steroid-induced ulcer Continue incentive spirometry and flutter valve Chest pain appears to be pleuritic

## 2021-09-21 NOTE — Assessment & Plan Note (Signed)
Troponin x 2 - 19 > 18 Troponin was flat

## 2021-09-21 NOTE — Assessment & Plan Note (Signed)
-   Continue Eliquis 

## 2021-09-21 NOTE — Assessment & Plan Note (Signed)
QTc 531ms Avoid QT prolonging drugs Magnesium level will be checked

## 2021-09-21 NOTE — ED Notes (Signed)
This nurse attempted to establish an IV, unsuccessful.

## 2021-09-21 NOTE — Assessment & Plan Note (Signed)
MCV 104.3 Folate and vitamin B12 levels will be checked

## 2021-09-21 NOTE — Assessment & Plan Note (Addendum)
Hemoglobin A1c 1 month ago was 6.1 Continue diet modification

## 2021-09-21 NOTE — H&P (Signed)
History and Physical    Patient: Gregory Mitchell TKW:[id number] DOB: Dec 12, 1940 DOA: 09/21/2021 DOS: the patient was seen and examined on 09/21/2021 PCP: Neale Burly, MD  Patient coming from: Home  Chief Complaint:  Chief Complaint  Patient presents with   Chest Pain    HPI: Gregory Mitchell is a 81 y.o. male with medical history significant of   combined systolic and diastolic heart failure, essential hypertension, pulmonary embolism on apixaban, diabetes mellitus type II, chronic kidney disease stage IIIa who presents to the emergency department due to 3-day onset of intermittent, nonradiating mid sternal chest pain which was described as squeezing sensation and was associated with some shortness of breath especially in the mornings, he also complained of cough with occasional production of yellow phlegm.  Chest pain was nonreproducible and sometimes worsened after food or fluid intake.  Patient also complained of shoulder pain.  There was no known alleviating factors.  He denies fever, chills, headache, palpitation, nausea, vomiting, abdominal pain. Patient was recently admitted from 1/14 to 1/17 due to Acute blood loss anemia/symptomatic anemia/acute GI bleed.  EGD done at that time showed severe candidiasis which was treated with [email or web address]  ED course: In the emergency department, he was hemodynamically stable.  Work-up in the ED showed normal CBC except for leukocytosis.  BMP showed hypernatremia, BUN/creatinine 25/1.52 (baseline creatinine at 1.1-1.5).  Troponin x 2 -19 > 18.  Influenza A, B, SARS coronavirus 2 was negative. Chest x-ray showed Left basilar opacity which may represent atelectasis or infiltrate, similar to prior examination. Hyperinflated lungs with prominent bronchial markings concerning for bronchitis. He was treated with Percocet 5-325 mg x 1.  Hospitalist was asked to admit patient for further evaluation and management.  Review of Systems: As mentioned in the history  of present illness. All other systems reviewed and are negative. Past Medical History:  Diagnosis Date   Asthma    Chronic renal insufficiency    COPD (chronic obstructive pulmonary disease) (HCC)    Heart failure (HCC)    Acute on chronic combined systolic and diastolic heart failure   Hypertension    Pulmonary emboli ([place]) 03/2019   Renal insufficiency    Type 2 diabetes mellitus ([place])    Past Surgical History:  Procedure Laterality Date   APPENDECTOMY     BALLOON DILATION N/A 03/18/2020   Procedure: BALLOON DILATION;  Surgeon: Eloise Harman, DO;  [facility];  Service: Endoscopy;  Laterality: N/A;   CHOLECYSTECTOMY     COLONOSCOPY  10/2006   Dr.Anwar:normal   ESOPHAGEAL BRUSHING  08/24/2021   Procedure: ESOPHAGEAL BRUSHING;  Surgeon: Eloise Harman, DO;  [facility];  Service: Endoscopy;;   ESOPHAGOGASTRODUODENOSCOPY (EGD) WITH PROPOFOL N/A 03/18/2020   Procedure: ESOPHAGOGASTRODUODENOSCOPY (EGD) WITH PROPOFOL;  Surgeon: Eloise Harman, DO;  [facility];  Service: Endoscopy;  Laterality: N/A;  2:30pm   ESOPHAGOGASTRODUODENOSCOPY (EGD) WITH PROPOFOL N/A 08/24/2021   Procedure: ESOPHAGOGASTRODUODENOSCOPY (EGD) WITH PROPOFOL;  Surgeon: Eloise Harman, DO;  [facility];  Service: Endoscopy;  Laterality: N/A;  with dilation   FLEXIBLE SIGMOIDOSCOPY N/A 08/24/2021   Procedure: FLEXIBLE SIGMOIDOSCOPY;  Surgeon: Eloise Harman, DO;  [facility];  Service: Endoscopy;  Laterality: N/A;   NASAL SINUS SURGERY     NEPHRECTOMY     RIGHT (Question CA.  No further therapy needed.)   RIGHT/LEFT HEART CATH AND CORONARY ANGIOGRAPHY N/A 09/03/2019   Procedure: RIGHT/LEFT HEART CATH AND CORONARY ANGIOGRAPHY;  Surgeon: Lorretta Harp, MD;  Location: [facility];  Service: Cardiovascular;  Laterality: N/A;   Social History:  reports that he quit smoking about 45 years ago. His smoking use included cigarettes. He started  smoking about 64 years ago. He has a 15.00 pack-year smoking history. He quit smokeless tobacco use about 15 years ago.  His smokeless tobacco use included chew. He reports that he does not currently use alcohol. He reports that he does not use drugs.  Allergies  Allergen Reactions   Amoxicillin Other (See Comments)    Makes eyes turn yellow and burn   Ciprofloxacin     Pt not sure   Sulfa Antibiotics Rash    Pt not sure of reaction    Family History  Problem Relation Age of Onset   Lung cancer Brother    Stroke Mother    Breast cancer Mother    Colon cancer Neg Hx     Prior to Admission medications   Medication Sig Start Date End Date Taking? Authorizing Provider  albuterol (PROVENTIL) (5 MG/ML) 0.5% nebulizer solution Inhale into the lungs. 06/03/21 06/03/22  [provider]  albuterol (VENTOLIN HFA) 108 (90 Base) MCG/ACT inhaler Inhale 2 puffs into the lungs every 6 (six) hours as needed for wheezing or shortness of breath.     [provider]  apixaban (ELIQUIS) 5 MG TABS tablet Take 5 mg by mouth 2 (two) times daily.    [provider]  carvedilol (COREG) 6.25 MG tablet Take 1 tablet (6.25 mg total) by mouth 2 (two) times daily. 12/01/20   Arnoldo Lenis, MD  dapagliflozin propanediol (FARXIGA) 10 MG TABS tablet Take 1 tablet (10 mg total) by mouth daily before breakfast. 10/30/20   Clegg, Amy D, NP  diclofenac Sodium (VOLTAREN) 1 % GEL Apply 1 application topically 4 (four) times daily as needed (pain). 08/29/20   [provider]  ENULOSE 10 GM/15ML SOLN Take by mouth daily as needed (constipation). 04/10/21   [provider]  esomeprazole (NEXIUM) 40 MG capsule Take 1 capsule (40 mg total) by mouth 2 (two) times daily before a meal. 04/30/21 04/30/22  Eloise Harman, DO  ferrous sulfate 325 (65 FE) MG tablet Take 1 tablet (325 mg total) by mouth daily. 09/17/21 09/17/22  Erenest Rasher, PA-C  guaiFENesin (ROBITUSSIN) 100 MG/5ML liquid  Take by mouth. 07/10/21   [provider]  HM SENNA-S 8.6-50 MG tablet Take 2 tablets by mouth daily. 07/10/21   [provider]  HYDROcodone-acetaminophen (NORCO) 7.5-325 MG tablet Take 1 tablet by mouth 2 (two) times daily as needed. 11/27/20   [provider]  ipratropium-albuterol (DUONEB) 0.5-2.5 (3) MG/3ML SOLN Take 3 mLs by nebulization every 6 (six) hours as needed (for shortness of breath/wheezing).     [provider]  mirtazapine (REMERON) 15 MG tablet Take 15 mg by mouth at bedtime.    [provider]  OXYGEN Inhale 4 L into the lungs at bedtime as needed (for shortness of breath).    [provider]  sacubitril-valsartan (ENTRESTO) 24-26 MG Take 1 tablet by mouth 2 (two) times daily.    [provider]  SM SENNA LAXATIVE 8.6 MG tablet Take 2 tablets by mouth 2 (two) times daily. 11/27/20   [provider]  tamsulosin (FLOMAX) 0.4 MG CAPS capsule Take 0.8 mg by mouth 2 (two) times daily.    [provider]  torsemide (DEMADEX) 20 MG tablet Take 1 tablet (20  mg total) by mouth 2 (two) times daily. Patient not taking: Reported on 08/22/2021 03/10/21   Arnoldo Lenis, MD  TRELEGY ELLIPTA 200-62.5-25 MCG/ACT AEPB Take 1 puff by mouth daily. 07/27/21   [provider]    Physical Exam: Vitals:   09/21/21 1830 09/21/21 2000 09/21/21 2231 09/21/21 2300  BP: 122/64 119/68  125/69  Pulse: 89 75  85  Resp: 20 15  18   Temp:      TempSrc:      SpO2: 93% 95% 100% 98%   General: Elderly male. Awake and alert and oriented x3. Not in any acute distress.  HEENT: NCAT.  PERRLA. EOMI. Sclerae anicteric.  Moist mucosal membranes. Neck: Neck supple without lymphadenopathy. No carotid bruits. No masses palpated.  Cardiovascular: Regular rate with normal S1-S2 sounds. No murmurs, rubs or gallops auscultated. No JVD.  Respiratory: Clear breath sounds.  No accessory muscle use. Abdomen: Soft, nontender,  nondistended. Active bowel sounds. No masses or hepatosplenomegaly  Skin: No rashes, lesions, or ulcerations.  Dry, warm to touch. Musculoskeletal:  2+ dorsalis pedis and radial pulses. Good ROM.  No contractures  Psychiatric: Intact judgment and insight.  Mood appropriate to current condition. Neurologic: No focal neurological deficits. Strength is 5/5 x 4.  CN II - XII grossly intact.   Data Reviewed: Normal sinus rhythm with PVCs at a rate of 93 bpm, RBBB.  QTc 512 ms  Assessment and Plan: * Atypical chest pain- (present on admission) Patient's chest pain may be due to multifactorial including reflux, he was recently treated for esophageal candidiasis. Chest x-ray was suggestive of atelectasis or infiltrate and was suspicious for bronchitis  Continue duo nebs, Mucinex, Solu-Medrol. Continue Protonix to prevent steroid-induced ulcer Continue incentive spirometry and flutter valve Chest pain appears to be pleuritic    Pulmonary embolism (HCC) Continue Eliquis  Prolonged QT interval QTc 533ms Avoid QT prolonging drugs Magnesium level will be checked   Macrocytic anemia MCV 104.3 Folate and vitamin B12 levels will be checked  Elevated troponin- (present on admission) Troponin x 2 - 19 > 18 Troponin was flat  GERD (gastroesophageal reflux disease) Continue Protonix  COPD (chronic obstructive pulmonary disease) ([place])- (present on admission) Continue DuoNebs, Breo and Incruse Ellipta and Ventolin inhaler  Essential hypertension Controlled.  BP meds will be held at this time due to soft BP  Type 2 diabetes mellitus (HCC) Hemoglobin A1c 1 month ago was 6.1 Continue diet modification   Advance Care Planning:   Code Status: Full Code   Consults: None  Family Communication: None at bedside  Severity of Illness: The appropriate patient status for this patient is OBSERVATION. Observation status is judged to be reasonable and necessary in order to provide the required  intensity of service to ensure the patient's safety. The patient's presenting symptoms, physical exam findings, and initial radiographic and laboratory data in the context of their medical condition is felt to place them at decreased risk for further clinical deterioration. Furthermore, it is anticipated that the patient will be medically stable for discharge from the hospital within 2 midnights of admission.   Author: Bernadette Hoit, DO 09/21/2021 11:49 PM  For on call review [email or web address].

## 2021-09-21 NOTE — ED Notes (Signed)
Pt family member upset about wait time. Explained delay of care. Informed charge RN and EDP. Will continue to monitor.

## 2021-09-21 NOTE — Assessment & Plan Note (Addendum)
Continue DuoNebs, Breo and Incruse Ellipta and Ventolin inhaler

## 2021-09-22 ENCOUNTER — Other Ambulatory Visit: Payer: Self-pay

## 2021-09-22 DIAGNOSIS — I10 Essential (primary) hypertension: Secondary | ICD-10-CM | POA: Diagnosis not present

## 2021-09-22 DIAGNOSIS — E119 Type 2 diabetes mellitus without complications: Secondary | ICD-10-CM | POA: Diagnosis not present

## 2021-09-22 DIAGNOSIS — I2699 Other pulmonary embolism without acute cor pulmonale: Secondary | ICD-10-CM

## 2021-09-22 DIAGNOSIS — J209 Acute bronchitis, unspecified: Secondary | ICD-10-CM | POA: Diagnosis not present

## 2021-09-22 LAB — GLUCOSE, CAPILLARY
Glucose-Capillary: 178 mg/dL — ABNORMAL HIGH (ref 70–99)
Glucose-Capillary: 199 mg/dL — ABNORMAL HIGH (ref 70–99)
Glucose-Capillary: 176 mg/dL — ABNORMAL HIGH (ref 70–99)

## 2021-09-22 LAB — CBC
HCT: 27.5 % — ABNORMAL LOW (ref 39.0–52.0)
Hemoglobin: 7.7 g/dL — ABNORMAL LOW (ref 13.0–17.0)
MCH: 28.5 pg (ref 26.0–34.0)
MCHC: 28 g/dL — ABNORMAL LOW (ref 30.0–36.0)
MCV: 101.9 fL — ABNORMAL HIGH (ref 80.0–100.0)
Platelets: 376 10*3/uL (ref 150–400)
RBC: 2.7 MIL/uL — ABNORMAL LOW (ref 4.22–5.81)
RDW: 16.4 % — ABNORMAL HIGH (ref 11.5–15.5)
WBC: 3.7 10*3/uL — ABNORMAL LOW (ref 4.0–10.5)
nRBC: 0 % (ref 0.0–0.2)

## 2021-09-22 LAB — COMPREHENSIVE METABOLIC PANEL
ALT: 6 U/L (ref 0–44)
AST: 12 U/L — ABNORMAL LOW (ref 15–41)
Albumin: 2.4 g/dL — ABNORMAL LOW (ref 3.5–5.0)
Alkaline Phosphatase: 54 U/L (ref 38–126)
Anion gap: 10 (ref 5–15)
BUN: 22 mg/dL (ref 8–23)
CO2: 20 mmol/L — ABNORMAL LOW (ref 22–32)
Calcium: 8.5 mg/dL — ABNORMAL LOW (ref 8.9–10.3)
Chloride: 111 mmol/L (ref 98–111)
Creatinine, Ser: 1.29 mg/dL — ABNORMAL HIGH (ref 0.61–1.24)
GFR, Estimated: 56 mL/min — ABNORMAL LOW (ref >60)
Glucose, Bld: 120 mg/dL — ABNORMAL HIGH (ref 70–99)
Potassium: 4.7 mmol/L (ref 3.5–5.1)
Sodium: 141 mmol/L (ref 135–145)
Total Bilirubin: 0.3 mg/dL (ref 0.3–1.2)
Total Protein: 6.5 g/dL (ref 6.5–8.1)

## 2021-09-22 LAB — CBG MONITORING, ED: Glucose-Capillary: 138 mg/dL — ABNORMAL HIGH (ref 70–99)

## 2021-09-22 LAB — VITAMIN B12: Vitamin B-12: 274 pg/mL (ref 180–914)

## 2021-09-22 LAB — MAGNESIUM: Magnesium: 1.9 mg/dL (ref 1.7–2.4)

## 2021-09-22 LAB — PHOSPHORUS: Phosphorus: 3.4 mg/dL (ref 2.5–4.6)

## 2021-09-22 LAB — FOLATE: Folate: 7.6 ng/mL (ref >5.9)

## 2021-09-22 MED ORDER — POLYETHYLENE GLYCOL 3350 17 G PO PACK
17.0000 g | PACK | Freq: Two times a day (BID) | ORAL | Status: DC
  Administered 2021-09-22 (×2): 17 g via ORAL
  Filled 2021-09-22 (×3): qty 1

## 2021-09-22 MED ORDER — BISACODYL 10 MG RE SUPP
10.0000 mg | Freq: Once | RECTAL | Status: DC
  Filled 2021-09-22: qty 1

## 2021-09-22 MED ORDER — SODIUM CHLORIDE 0.9 % IV SOLN
1.0000 g | INTRAVENOUS | Status: DC
  Administered 2021-09-22 – 2021-09-23 (×2): 1 g via INTRAVENOUS
  Filled 2021-09-22 (×2): qty 10

## 2021-09-22 MED ORDER — INSULIN ASPART 100 UNIT/ML IJ SOLN
0.0000 [IU] | Freq: Three times a day (TID) | INTRAMUSCULAR | Status: DC
  Administered 2021-09-23: 1 [IU] via SUBCUTANEOUS

## 2021-09-22 MED ORDER — DAPAGLIFLOZIN PROPANEDIOL 5 MG PO TABS
5.0000 mg | ORAL_TABLET | Freq: Every day | ORAL | Status: DC
  Administered 2021-09-23: 5 mg via ORAL
  Filled 2021-09-22 (×3): qty 1

## 2021-09-22 MED ORDER — ALBUTEROL SULFATE (2.5 MG/3ML) 0.083% IN NEBU: 2.5000 mg | INHALATION_SOLUTION | RESPIRATORY_TRACT | Status: DC | PRN

## 2021-09-22 MED ORDER — TORSEMIDE 20 MG PO TABS
20.0000 mg | ORAL_TABLET | Freq: Every day | ORAL | Status: DC
  Administered 2021-09-23: 20 mg via ORAL
  Filled 2021-09-22: qty 1

## 2021-09-22 MED ORDER — INSULIN ASPART 100 UNIT/ML IJ SOLN: 0.0000 [IU] | Freq: Every day | INTRAMUSCULAR | Status: DC

## 2021-09-22 MED ORDER — BISACODYL 10 MG RE SUPP
10.0000 mg | Freq: Once | RECTAL | Status: AC
  Administered 2021-09-22: 10 mg via RECTAL
  Filled 2021-09-22: qty 1

## 2021-09-22 MED ORDER — DOXYCYCLINE HYCLATE 100 MG PO TABS
100.0000 mg | ORAL_TABLET | Freq: Two times a day (BID) | ORAL | Status: DC
  Administered 2021-09-22 – 2021-09-23 (×3): 100 mg via ORAL
  Filled 2021-09-22 (×3): qty 1

## 2021-09-22 MED ORDER — SACUBITRIL-VALSARTAN 24-26 MG PO TABS
1.0000 | ORAL_TABLET | Freq: Two times a day (BID) | ORAL | Status: DC
  Administered 2021-09-23: 1 via ORAL
  Filled 2021-09-22: qty 1

## 2021-09-22 NOTE — Progress Notes (Signed)
Patients family requested to speak with MD regarding patients status. MD Courage made aware. MD contacted family(Daughter) via phone.

## 2021-09-22 NOTE — TOC Progression Note (Signed)
Transition of Care [facility]) - Progression Note    Patient Details  Name: Gregory Mitchell MRN: [id number] Date of Birth: [birth year]  Transition of Care [facility]) CM/SW Contact  Salome Arnt, [place] Phone Number: 09/22/2021, 10:02 AM  Clinical Narrative:   Transition of Care (TOC) Screening Note   Patient Details  Name: Gregory Mitchell Date of Birth: 1941-03-23   Transition of Care [facility]) CM/SW Contact:    Salome Arnt, [place] Phone Number: 09/22/2021, 10:03 AM    Transition of Care Department [facility]) has reviewed patient and no TOC needs have been identified at this time. We will continue to monitor patient advancement through interdisciplinary progression rounds. If new patient transition needs arise, please place a TOC consult.            Expected Discharge Plan and Services                                                 Social Determinants of Health (SDOH) Interventions    Readmission Risk Interventions Readmission Risk Prevention Plan 08/23/2021 10/06/2020  Transportation Screening Complete Complete  PCP or Specialist Appt within 5-7 Days - Complete  PCP or Specialist Appt within 3-5 Days Complete -  Home Care Screening - Complete  Medication Review (RN CM) - Complete  HRI or Home Care Consult Complete -  Social Work Consult for [place] Planning/Counseling Complete -  Palliative Care Screening Not Applicable -  Medication Review [employer]) Complete -  Some recent data might be hidden

## 2021-09-22 NOTE — Care Management Obs Status (Signed)
[place] NOTIFICATION   Patient Details  Name: Gregory Mitchell MRN: [id number] Date of Birth: 23-Dec-1940   Medicare Observation Status Notification Given:  Yes    Tommy Medal 09/22/2021, 4:37 PM

## 2021-09-22 NOTE — Progress Notes (Addendum)
PROGRESS NOTE     Gregory Mitchell, is a 81 y.o. male, DOB - Sep 07, 1940, IFO:[id number]  Admit date - 09/21/2021   Admitting Physician Bernadette Hoit, DO  Outpatient Primary MD for the patient is Neale Burly, MD  LOS - 0  Chief Complaint  Patient presents with   Chest Pain        Brief Narrative:    81 y.o. male with medical history significant of   combined systolic and diastolic heart failure, essential hypertension, pulmonary embolism on apixaban, diabetes mellitus type II, chronic kidney disease stage IIIa admitted on 09/21/2021 with respiratory symptoms, worsening cough and musculoskeletal chest discomfort    -Assessment and Plan:  1)Acute COPD exacerbation--- -Cough and respiratory symptoms persist -Chest x-ray with possible left-sided pneumonia -IV Solu-Medrol, bronchodilators, Rocephin/doxycycline and mucolytics as ordered  2)Atypical chest pain--- most likely secondary to #1 above with cough -Ruled out for ACS by cardiac enzymes and EKG -Patient was also recently treated for GERD/esophageal candidiasis -At risk for recurrent candidiasis given need for antibiotics and steroids  3)History of pulmonary embolism-- Eliquis on hold due to Anemia and blood loss -Continue to Hold Eliquis pending GI evaluation  4)GERD--continue Protonix  5)HFrEF--resume torsemide 20 mg twice daily, Farxiga and Entresto -Hold Coreg  6)Anxiety/Insomnia--hold Remeron concerns about prolonged QT  7) chronic hypoxic respiratory failure--patient uses O2 only at night  8)DM2--A1c 6.1 reflecting excellent diabetic control PTA -Continue Farxiga Use Novolog/Humalog Sliding scale insulin with Accu-Cheks/Fingersticks as ordered   9) chronic Iron anemia--- Hgb stable above 7, monitor closely and transfuse as clinically indicated Hgb is 7.7 (same as 08/24/21) -EGD on 08/24/21 with severe candidiasis , no ulcers. -Treated with Diflucan for 21 days Eliquis probably contributing to his acute blood  loss anemia--Eliquis currently on hold Flex sigmoidoscopy showed a sacral decubitus ulcer found on the perianal exam. -Re-consult Gi in am   Disposition/Need for in-Hospital Stay- patient unable to be discharged at this time due to acute COPD exacerbation and possible pneumonia requiring IV antibiotics and IV steroids -Anticipate discharge home in 1 to 2 days if improves clinically  Disposition: The patient is from: Home              Anticipated d/c is to: Home              Anticipated d/c date is: 1 day              Patient currently is not medically stable to d/c. Barriers: Not Clinically Stable-   Code Status :  -  Code Status: Full Code   Family Communication:    NA (patient is alert, awake and coherent)   DVT Prophylaxis  :   - SCDs  SCDs Start: 09/21/21 2023 apixaban (ELIQUIS) tablet 5 mg   Lab Results  Component Value Date   PLT 376 09/22/2021    Inpatient Medications  Scheduled Meds:  apixaban  5 mg Oral BID   dextromethorphan-guaiFENesin  1 tablet Oral BID   doxycycline  100 mg Oral Q12H   fluticasone furoate-vilanterol  1 puff Inhalation Daily   And   umeclidinium bromide  1 puff Inhalation Daily   ipratropium-albuterol  3 mL Nebulization TID   methylPREDNISolone (SOLU-MEDROL) injection  40 mg Intravenous BID   pantoprazole  40 mg Oral Daily   polyethylene glycol  17 g Oral BID   Continuous Infusions:  cefTRIAXone (ROCEPHIN)  IV 1 g (09/22/21 0854)   PRN Meds:.acetaminophen **OR** acetaminophen, albuterol   Anti-infectives (From admission, onward)  Start     Dose/Rate Route Frequency Ordered Stop   09/22/21 1000  doxycycline (VIBRA-TABS) tablet 100 mg        100 mg Oral Every 12 hours 09/22/21 0825     09/22/21 0900  cefTRIAXone (ROCEPHIN) 1 g in sodium chloride 0.9 % 100 mL IVPB        1 g 200 mL/hr over 30 Minutes Intravenous Every 24 hours 09/22/21 0825           Subjective: Gregory Mitchell today has no fevers, no emesis,  No further chest pain,    -Cough and congestion persist -No bleeding concerns -daughter at bedside--questions answered   Objective: Vitals:   09/22/21 0830 09/22/21 0929 09/22/21 1312 09/22/21 1351  BP: 114/69 118/79 128/69   Pulse: 87 81 79   Resp: 18 17 15    Temp: 98 F (01.6 C) 97.9 F (36.6 C) 98.6 F (37 C)   TempSrc: Oral Oral    SpO2: 99% 100% 100% 99%    Intake/Output Summary (Last 24 hours) at 09/22/2021 1734 Last data filed at 09/22/2021 1300 Gross per 24 hour  Intake 180 ml  Output 200 ml  Net -20 ml    Physical Exam  Gen:- Awake Alert, in no acute distress HEENT:- [place].AT, No sclera icterus Neck-Supple Neck,No JVD,.  Lungs-slightly diminished on the left, few scattered wheezes, CV- S1, S2 normal, regular  Abd-  +ve B.Sounds, Abd Soft, No tenderness,    Extremity/Skin:- No  edema, pedal pulses present  Psych-affect is appropriate, oriented x3 Neuro-no new focal deficits, no tremors  Data Reviewed: I have personally reviewed following labs and imaging studies  CBC: Recent Labs  Lab 09/16/21 1354 09/21/21 1622 09/22/21 0334  WBC 4.8 4.5 3.7*  NEUTROABS 1.5  --   --   HGB 7.7* 7.7* 7.7*  HCT 24.5* 28.8* 27.5*  MCV 90 104.3* 101.9*  PLT 397 385 553   Basic Metabolic Panel: Recent Labs  Lab 09/21/21 1622 09/22/21 0334  NA 146* 141  K 4.3 4.7  CL 111 111  CO2 26 20*  GLUCOSE 92 120*  BUN 25* 22  CREATININE 1.52* 1.29*  CALCIUM 8.9 8.5*  MG  --  1.9  PHOS  --  3.4   GFR: CrCl cannot be calculated (Unknown ideal weight.). Liver Function Tests: Recent Labs  Lab 09/22/21 0334  AST 12*  ALT 6  ALKPHOS 54  BILITOT 0.3  PROT 6.5  ALBUMIN 2.4*   Cardiac Enzymes: No results for input(s): CKTOTAL, CKMB, CKMBINDEX, TROPONINI in the last 168 hours. BNP (last 3 results) No results for input(s): PROBNP in the last 8760 hours. HbA1C: No results for input(s): HGBA1C in the last 72 hours. Sepsis Labs: @LABRCNTIP (procalcitonin:4,lacticidven:4) ) Recent Results (from  the past 240 hour(s))  Resp Panel by RT-PCR (Flu A&B, Covid) Nasopharyngeal Swab     Status: None   Collection Time: 09/21/21  8:57 PM   Specimen: Nasopharyngeal Swab; Nasopharyngeal(NP) swabs in vial transport medium  Result Value Ref Range Status   SARS Coronavirus 2 by RT PCR NEGATIVE NEGATIVE Final    Comment: (NOTE) SARS-CoV-2 target nucleic acids are NOT DETECTED.  The SARS-CoV-2 RNA is generally detectable in upper respiratory specimens during the acute phase of infection. The lowest concentration of SARS-CoV-2 viral copies this assay can detect is 138 copies/mL. A negative result does not preclude SARS-Cov-2 infection and should not be used as the sole basis for treatment or other patient management decisions. A negative result may occur  with  improper specimen collection/handling, submission of specimen other than nasopharyngeal swab, presence of viral mutation(s) within the areas targeted by this assay, and inadequate number of viral copies(<138 copies/mL). A negative result must be combined with clinical observations, patient history, and epidemiological information. The expected result is Negative.  Fact Sheet for Patients:  EntrepreneurPulse.com.au  Fact Sheet for Healthcare Providers:  IncredibleEmployment.be  This test is no t yet approved or cleared by the [place] FDA and  has been authorized for detection and/or diagnosis of SARS-CoV-2 by FDA under an Emergency Use Authorization (EUA). This EUA will remain  in effect (meaning this test can be used) for the duration of the COVID-19 declaration under Section 564(b)(1) of the Act, 21 U.S.C.section 360bbb-3(b)(1), unless the authorization is terminated  or revoked sooner.       Influenza A by PCR NEGATIVE NEGATIVE Final   Influenza B by PCR NEGATIVE NEGATIVE Final    Comment: (NOTE) The Xpert Xpress SARS-CoV-2/FLU/RSV plus assay is intended as an aid in the diagnosis of  influenza from Nasopharyngeal swab specimens and should not be used as a sole basis for treatment. Nasal washings and aspirates are unacceptable for Xpert Xpress SARS-CoV-2/FLU/RSV testing.  Fact Sheet for Patients: EntrepreneurPulse.com.au  Fact Sheet for Healthcare Providers: IncredibleEmployment.be  This test is not yet approved or cleared by the [place] FDA and has been authorized for detection and/or diagnosis of SARS-CoV-2 by FDA under an Emergency Use Authorization (EUA). This EUA will remain in effect (meaning this test can be used) for the duration of the COVID-19 declaration under Section 564(b)(1) of the Act, 21 U.S.C. section 360bbb-3(b)(1), unless the authorization is terminated or revoked.  Performed at [facility], [address] [place], [place] [address]       Radiology Studies: DG Chest 2 View  Result Date: 09/21/2021 CLINICAL DATA:  Chest pain for 2 days.  COPD, asthma. EXAM: CHEST - 2 VIEW COMPARISON:  Chest radiograph dated August 22, 2021 FINDINGS: The heart is normal in size. Atherosclerotic calcification of the aortic arch. Mild bilateral bronchial thickening, unchanged. Left basilar opacity which may represent atelectasis or infiltrate. IMPRESSION: 1. Left basilar opacity which may represent atelectasis or infiltrate, similar to prior examination. 2. Hyperinflated lungs with prominent bronchial markings concerning for bronchitis. 3. No pleural effusion or pneumothorax. Electronically Signed   By: Keane Police D.O.   On: 09/21/2021 16:15     Scheduled Meds:  apixaban  5 mg Oral BID   dextromethorphan-guaiFENesin  1 tablet Oral BID   doxycycline  100 mg Oral Q12H   fluticasone furoate-vilanterol  1 puff Inhalation Daily   And   umeclidinium bromide  1 puff Inhalation Daily   ipratropium-albuterol  3 mL Nebulization TID   methylPREDNISolone (SOLU-MEDROL) injection  40 mg Intravenous BID   pantoprazole  40 mg Oral  Daily   polyethylene glycol  17 g Oral BID   Continuous Infusions:  cefTRIAXone (ROCEPHIN)  IV 1 g (09/22/21 0854)     LOS: 0 days    Roxan Hockey M.D on 09/22/2021 at 5:34 PM  Go to [email or web address] - for contact info  [facility] - Office  [phone]  If 7PM-7AM, please contact night-coverage [email or web address] Password [facility] 09/22/2021, 5:34 PM

## 2021-09-22 NOTE — ED Notes (Signed)
Patient pivoted to bsc with minimal support Patient unable to have BM

## 2021-09-23 ENCOUNTER — Encounter (HOSPITAL_COMMUNITY): Payer: Self-pay | Admitting: Gastroenterology

## 2021-09-23 DIAGNOSIS — J209 Acute bronchitis, unspecified: Secondary | ICD-10-CM | POA: Diagnosis not present

## 2021-09-23 DIAGNOSIS — K5900 Constipation, unspecified: Secondary | ICD-10-CM

## 2021-09-23 DIAGNOSIS — D539 Nutritional anemia, unspecified: Secondary | ICD-10-CM | POA: Diagnosis not present

## 2021-09-23 DIAGNOSIS — R0789 Other chest pain: Secondary | ICD-10-CM | POA: Diagnosis not present

## 2021-09-23 DIAGNOSIS — J449 Chronic obstructive pulmonary disease, unspecified: Secondary | ICD-10-CM | POA: Diagnosis not present

## 2021-09-23 LAB — OCCULT BLOOD X 1 CARD TO LAB, STOOL: Fecal Occult Bld: NEGATIVE

## 2021-09-23 LAB — GLUCOSE, CAPILLARY
Glucose-Capillary: 170 mg/dL — ABNORMAL HIGH (ref 70–99)
Glucose-Capillary: 172 mg/dL — ABNORMAL HIGH (ref 70–99)
Glucose-Capillary: 129 mg/dL — ABNORMAL HIGH (ref 70–99)

## 2021-09-23 LAB — BASIC METABOLIC PANEL
Anion gap: 10 (ref 5–15)
BUN: 29 mg/dL — ABNORMAL HIGH (ref 8–23)
CO2: 21 mmol/L — ABNORMAL LOW (ref 22–32)
Calcium: 8.5 mg/dL — ABNORMAL LOW (ref 8.9–10.3)
Chloride: 107 mmol/L (ref 98–111)
Creatinine, Ser: 1.38 mg/dL — ABNORMAL HIGH (ref 0.61–1.24)
GFR, Estimated: 51 mL/min — ABNORMAL LOW (ref >60)
Glucose, Bld: 171 mg/dL — ABNORMAL HIGH (ref 70–99)
Potassium: 5.1 mmol/L (ref 3.5–5.1)
Sodium: 138 mmol/L (ref 135–145)

## 2021-09-23 LAB — CBC
HCT: 27.2 % — ABNORMAL LOW (ref 39.0–52.0)
Hemoglobin: 7.7 g/dL — ABNORMAL LOW (ref 13.0–17.0)
MCH: 28.3 pg (ref 26.0–34.0)
MCHC: 28.3 g/dL — ABNORMAL LOW (ref 30.0–36.0)
MCV: 100 fL (ref 80.0–100.0)
Platelets: 383 10*3/uL (ref 150–400)
RBC: 2.72 MIL/uL — ABNORMAL LOW (ref 4.22–5.81)
RDW: 16.3 % — ABNORMAL HIGH (ref 11.5–15.5)
WBC: 2.6 10*3/uL — ABNORMAL LOW (ref 4.0–10.5)
nRBC: 0 % (ref 0.0–0.2)

## 2021-09-23 LAB — IRON AND TIBC
Iron: 56 ug/dL (ref 45–182)
Saturation Ratios: 27 % (ref 17.9–39.5)
TIBC: 207 ug/dL — ABNORMAL LOW (ref 250–450)
UIBC: 151 ug/dL

## 2021-09-23 LAB — FERRITIN: Ferritin: 202 ng/mL (ref 24–336)

## 2021-09-23 MED ORDER — DOXYCYCLINE HYCLATE 100 MG PO TABS: 100.0000 mg | ORAL_TABLET | Freq: Two times a day (BID) | ORAL | 0 refills | Status: AC

## 2021-09-23 MED ORDER — HYDROCORT-PRAMOXINE (PERIANAL) 1-1 % EX FOAM: 1.0000 | Freq: Two times a day (BID) | CUTANEOUS | 0 refills | Status: AC

## 2021-09-23 MED ORDER — PREDNISONE 20 MG PO TABS: 20.0000 mg | ORAL_TABLET | Freq: Every day | ORAL | 0 refills | Status: AC

## 2021-09-23 MED ORDER — PREDNISONE 20 MG PO TABS: 20.0000 mg | ORAL_TABLET | Freq: Every day | ORAL | Status: DC

## 2021-09-23 MED ORDER — BISACODYL 10 MG RE SUPP
10.0000 mg | Freq: Once | RECTAL | Status: AC
  Administered 2021-09-23: 10 mg via RECTAL
  Filled 2021-09-23: qty 1

## 2021-09-23 MED ORDER — POLYETHYLENE GLYCOL 3350 17 G PO PACK: 17.0000 g | PACK | Freq: Every day | ORAL | 1 refills | Status: DC

## 2021-09-23 MED ORDER — TORSEMIDE 20 MG PO TABS: 20.0000 mg | ORAL_TABLET | Freq: Every day | ORAL | 6 refills | Status: DC

## 2021-09-23 MED ORDER — ESOMEPRAZOLE MAGNESIUM 40 MG PO CPDR: 40.0000 mg | DELAYED_RELEASE_CAPSULE | Freq: Every day | ORAL | 3 refills | Status: DC

## 2021-09-23 MED ORDER — HYDROCORT-PRAMOXINE (PERIANAL) 1-1 % EX FOAM
1.0000 | Freq: Two times a day (BID) | CUTANEOUS | Status: DC
  Administered 2021-09-23: 1 via RECTAL
  Filled 2021-09-23: qty 10

## 2021-09-23 NOTE — Discharge Instructions (Signed)
IMPORTANT INFORMATION: PAY CLOSE ATTENTION  ? ?PHYSICIAN DISCHARGE INSTRUCTIONS ? ?Follow with Primary care provider  Hasanaj, Xaje A, MD  and other consultants as instructed by your Hospitalist Physician ? ?SEEK MEDICAL CARE OR RETURN TO EMERGENCY ROOM IF SYMPTOMS COME BACK, WORSEN OR NEW PROBLEM DEVELOPS  ? ?Please note: ?You were cared for by a hospitalist during your hospital stay. Every effort will be made to forward records to your primary care provider.  You can request that your primary care provider send for your hospital records if they have not received them.  Once you are discharged, your primary care physician will handle any further medical issues. Please note that NO REFILLS for any discharge medications will be authorized once you are discharged, as it is imperative that you return to your primary care physician (or establish a relationship with a primary care physician if you do not have one) for your post hospital discharge needs so that they can reassess your need for medications and monitor your lab values. ? ?Please get a complete blood count and chemistry panel checked by your Primary MD at your next visit, and again as instructed by your Primary MD. ? ?Get Medicines reviewed and adjusted: ?Please take all your medications with you for your next visit with your Primary MD ? ?Laboratory/radiological data: ?Please request your Primary MD to go over all hospital tests and procedure/radiological results at the follow up, please ask your primary care provider to get all Hospital records sent to his/her office. ? ?In some cases, they will be blood work, cultures and biopsy results pending at the time of your discharge. Please request that your primary care provider follow up on these results. ? ?If you are diabetic, please bring your blood sugar readings with you to your follow up appointment with primary care.   ? ?Please call and make your follow up appointments as soon as possible.   ? ?Also Note  the following: ?If you experience worsening of your admission symptoms, develop shortness of breath, life threatening emergency, suicidal or homicidal thoughts you must seek medical attention immediately by calling 911 or calling your MD immediately  if symptoms less severe. ? ?You must read complete instructions/literature along with all the possible adverse reactions/side effects for all the Medicines you take and that have been prescribed to you. Take any new Medicines after you have completely understood and accpet all the possible adverse reactions/side effects.  ? ?Do not drive when taking Pain medications or sleeping medications (Benzodiazepines) ? ?Do not take more than prescribed Pain, Sleep and Anxiety Medications. It is not advisable to combine anxiety,sleep and pain medications without talking with your primary care practitioner ? ?Special Instructions: If you have smoked or chewed Tobacco  in the last 2 yrs please stop smoking, stop any regular Alcohol  and or any Recreational drug use. ? ?Wear Seat belts while driving.  Do not drive if taking any narcotic, mind altering or controlled substances or recreational drugs or alcohol.  ? ? ? ? ? ?

## 2021-09-23 NOTE — TOC Transition Note (Signed)
Transition of Care [facility]) - CM/SW Discharge Note   Patient Details  Name: Gregory Mitchell MRN: [id number] Date of Birth: [birth year]  [facility] CM/SW Contact:  Boneta Lucks, RN Phone Number: 09/23/2021, 2:18 PM   Clinical Narrative:   Patient discharging home. MD ordered home health. TOC called patient he is declining. He does not want TOC to send out a referral for home health at this time. TOC explained if he gets home and has a home health need to contact his PCP. He understands.    Final next level of care: Home/Self Care Barriers to Discharge: No Barriers Identified  Patient Goals and CMS Choice Patient states their goals for this hospitalization and ongoing recovery are:: to go home. CMS Medicare.gov Compare Post Acute Care list provided to:: Patient Choice offered to / list presented to : Patient  Discharge Placement    Patient and family notified of of transfer: 09/23/21  Discharge Plan and Services     Readmission Risk Interventions Readmission Risk Prevention Plan 08/23/2021 10/06/2020  Transportation Screening Complete Complete  PCP or Specialist Appt within 5-7 Days - Complete  PCP or Specialist Appt within 3-5 Days Complete -  Home Care Screening - Complete  Medication Review (RN CM) - Complete  HRI or Home Care Consult Complete -  Social Work Consult for [place] Planning/Counseling Complete -  Palliative Care Screening Not Applicable -  Medication Review [employer] photographer) Complete -  Some recent data might be hidden

## 2021-09-23 NOTE — Discharge Summary (Signed)
Physician Discharge Summary  Gregory Mitchell ION:[id number] DOB: April 14, 1941 DOA: 09/21/2021  PCP: Neale Burly, MD GI: Dr. Abbey Chatters   Admit date: 09/21/2021 Discharge date: 09/23/2021  Admitted From:   HOME  Disposition: [place]  Recommendations for Outpatient Follow-up:  Follow up with PCP in 1 weeks Follow up with GI in 3-4 weeks Please obtain BMP/CBC in 2-3 weeks to follow up  Home Health:  PT, RN  Discharge Condition: STABLE   CODE STATUS: FULL DIET: soft foods recommended, dysphagia 3   Brief Hospitalization Summary: Please see all hospital notes, images, labs for full details of the hospitalization. ADMISSION HPI:  81 y.o. male with medical history significant of   combined systolic and diastolic heart failure, essential hypertension, pulmonary embolism on apixaban, diabetes mellitus type II, chronic kidney disease stage IIIa who presents to the emergency department due to 3-day onset of intermittent, nonradiating mid sternal chest pain which was described as squeezing sensation and was associated with some shortness of breath especially in the mornings, he also complained of cough with occasional production of yellow phlegm.  Chest pain was nonreproducible and sometimes worsened after food or fluid intake.  Patient also complained of shoulder pain.  There was no known alleviating factors.  He denies fever, chills, headache, palpitation, nausea, vomiting, abdominal pain. Patient was recently admitted from 1/14 to 1/17 due to Acute blood loss anemia/symptomatic anemia/acute GI bleed.  EGD done at that time showed severe candidiasis which was treated with [email or web address]  ED course: In the emergency department, he was hemodynamically stable.  Work-up in the ED showed normal CBC except for leukocytosis.  BMP showed hypernatremia, BUN/creatinine 25/1.52 (baseline creatinine at 1.1-1.5).  Troponin x 2 -19 > 18.  Influenza A, B, SARS coronavirus 2 was negative.  Chest x-ray showed Left  basilar opacity which may represent atelectasis or infiltrate, similar to prior examination. Hyperinflated lungs with prominent bronchial markings concerning for bronchitis.  He was treated with Percocet 5-325 mg x 1.  Hospitalist was asked to admit patient for further evaluation and management.  HOSPITAL COURSE BY PROBLEM LIST   1)Acute COPD exacerbation--- -Cough and respiratory symptoms persist -Chest x-ray with possible left-sided pneumonia -IV Solu-Medrol, bronchodilators, Rocephin/doxycycline and mucolytics as ordered -He responded well to treatments and now feeling better and back to baseline.  No wheezing or difficulty breathing at this time.  He is stable to discharge home with outpatient follow up.  - He will discharge on oral doxycycline and prednisone 20 mg daily x 5 days.  Resume his home bronchodilators as needed.     2)Atypical chest pain-/ PLEURITIC CHEST PAIN-- most likely secondary to #1 above with cough -Ruled out for ACS by cardiac enzymes and EKG -Patient was also recently treated for GERD/esophageal candidiasis - outpatient GI follow up recommended.    3)History of pulmonary embolism-- Eliquis reportedly had been discontinued several months ago by family   4)GERD--continue Protonix   5)HFrEF--resume torsemide 20 mg twice daily, Farxiga and Entresto   6)Anxiety/Insomnia-   7) chronic hypoxic respiratory failure--patient uses O2 only at night   8)DM2--A1c 6.1 reflecting excellent diabetic control PTA -Continue Farxiga Use Novolog/Humalog Sliding scale insulin with Accu-Cheks/Fingersticks as ordered    9) chronic Iron anemia--- Hgb stable above 7, monitor closely and transfuse as clinically indicated Hgb is 7.7 (same as 08/24/21) -EGD on 08/24/21 with severe candidiasis , no ulcers. -Treated with Diflucan for 21 days Eliquis probably contributing to his acute blood loss anemia--Eliquis currently on hold Flex  sigmoidoscopy showed a sacral decubitus ulcer found on the  perianal exam. - Gi evaluated him again 2/15.  His Hg has remained stable and stool guaiac has been negative.  No recommendation for inpatient procedures at this time and outpatient follow up with his GI Dr Abbey Chatters.     Discharge Diagnoses:  Principal Problem:   Atypical chest pain Active Problems:   Type 2 diabetes mellitus (HCC)   Essential hypertension   COPD (chronic obstructive pulmonary disease) (HCC)   GERD (gastroesophageal reflux disease)   Elevated troponin   Macrocytic anemia   Prolonged QT interval   Pulmonary embolism (HCC)   Acute bronchitis   Constipation   Discharge Instructions:  Allergies as of 09/23/2021       Reactions   Amoxicillin Other (See Comments)   Makes eyes turn yellow and burn   Ciprofloxacin    Pt not sure   Sulfa Antibiotics Rash   Pt not sure of reaction        Medication List     STOP taking these medications    carvedilol 6.25 MG tablet Commonly known as: COREG   Eliquis 5 MG Tabs tablet Generic drug: apixaban       TAKE these medications    albuterol 108 (90 Base) MCG/ACT inhaler Commonly known as: VENTOLIN HFA Inhale 2 puffs into the lungs every 6 (six) hours as needed for wheezing or shortness of breath.   albuterol (5 MG/ML) 0.5% nebulizer solution Commonly known as: PROVENTIL Inhale 2.5 mg into the lungs every 4 (four) hours as needed for shortness of breath or wheezing.   dapagliflozin propanediol 10 MG Tabs tablet Commonly known as: Farxiga Take 1 tablet (10 mg total) by mouth daily before breakfast.   diclofenac Sodium 1 % Gel Commonly known as: VOLTAREN Apply 1 application topically 4 (four) times daily as needed (pain).   doxycycline 100 MG tablet Commonly known as: VIBRA-TABS Take 1 tablet (100 mg total) by mouth every 12 (twelve) hours for 3 days.   Entresto 24-26 MG Generic drug: sacubitril-valsartan Take 1 tablet by mouth 2 (two) times daily.   esomeprazole 40 MG capsule Commonly known as:  NexIUM Take 1 capsule (40 mg total) by mouth daily. What changed: when to take this   ferrous sulfate 325 (65 FE) MG tablet Take 1 tablet (325 mg total) by mouth daily.   HM Senna-S 8.6-50 MG tablet Generic drug: senna-docusate Take 2 tablets by mouth daily.   HYDROcodone-acetaminophen 7.5-325 MG tablet Commonly known as: NORCO Take 1 tablet by mouth 2 (two) times daily as needed for moderate pain.   hydrocortisone-pramoxine rectal foam Commonly known as: PROCTOFOAM-HC Place 1 applicator rectally 2 (two) times daily for 5 days.   ipratropium-albuterol 0.5-2.5 (3) MG/3ML Soln Commonly known as: DUONEB Take 3 mLs by nebulization every 6 (six) hours as needed (for shortness of breath/wheezing).   mirtazapine 15 MG tablet Commonly known as: REMERON Take 15 mg by mouth at bedtime.   OXYGEN Inhale 4 L into the lungs at bedtime as needed (for shortness of breath).   polyethylene glycol 17 g packet Commonly known as: MIRALAX / GLYCOLAX Take 17 g by mouth daily.   predniSONE 20 MG tablet Commonly known as: DELTASONE Take 1 tablet (20 mg total) by mouth daily with breakfast for 5 days. Start taking on: September 24, 2021   tamsulosin 0.4 MG Caps capsule Commonly known as: FLOMAX Take 0.8 mg by mouth 2 (two) times daily.   torsemide 20 MG tablet  Commonly known as: DEMADEX Take 1 tablet (20 mg total) by mouth daily. What changed: when to take this   Trelegy Ellipta 200-62.5-25 MCG/ACT Aepb Generic drug: Fluticasone-Umeclidin-Vilant Take 1 puff by mouth daily.        Follow-up Information     Neale Burly, MD. Schedule an appointment as soon as possible for a visit in 1 week(s).   Specialty: Internal Medicine Why: Hospital Follow Up Contact information: [place] [id number] [id number] [phone]         Arnoldo Lenis, MD .   Specialty: Cardiology Contact information: [place] [address] [place], [place], DO. Schedule an appointment as soon as possible for a visit in 3 week(s).   Specialty: Gastroenterology Why: Hospital Follow Up Contact information: [place] [address] [phone]                Allergies  Allergen Reactions   Amoxicillin Other (See Comments)    Makes eyes turn yellow and burn   Ciprofloxacin     Pt not sure   Sulfa Antibiotics Rash    Pt not sure of reaction   Allergies as of 09/23/2021       Reactions   Amoxicillin Other (See Comments)   Makes eyes turn yellow and burn   Ciprofloxacin    Pt not sure   Sulfa Antibiotics Rash   Pt not sure of reaction        Medication List     STOP taking these medications    carvedilol 6.25 MG tablet Commonly known as: COREG   Eliquis 5 MG Tabs tablet Generic drug: apixaban       TAKE these medications    albuterol 108 (90 Base) MCG/ACT inhaler Commonly known as: VENTOLIN HFA Inhale 2 puffs into the lungs every 6 (six) hours as needed for wheezing or shortness of breath.   albuterol (5 MG/ML) 0.5% nebulizer solution Commonly known as: PROVENTIL Inhale 2.5 mg into the lungs every 4 (four) hours as needed for shortness of breath or wheezing.   dapagliflozin propanediol 10 MG Tabs tablet Commonly known as: Farxiga Take 1 tablet (10 mg total) by mouth daily before breakfast.   diclofenac Sodium 1 % Gel Commonly known as: VOLTAREN Apply 1 application topically 4 (four) times daily as needed (pain).   doxycycline 100 MG tablet Commonly known as: VIBRA-TABS Take 1 tablet (100 mg total) by mouth every 12 (twelve) hours for 3 days.   Entresto 24-26 MG Generic drug: sacubitril-valsartan Take 1 tablet by mouth 2 (two) times daily.   esomeprazole 40 MG capsule Commonly known as: NexIUM Take 1 capsule (40 mg total) by mouth daily. What changed: when to take this   ferrous sulfate 325 (65 FE) MG tablet Take 1 tablet (325 mg total) by mouth daily.   HM Senna-S  8.6-50 MG tablet Generic drug: senna-docusate Take 2 tablets by mouth daily.   HYDROcodone-acetaminophen 7.5-325 MG tablet Commonly known as: NORCO Take 1 tablet by mouth 2 (two) times daily as needed for moderate pain.   hydrocortisone-pramoxine rectal foam Commonly known as: PROCTOFOAM-HC Place 1 applicator rectally 2 (two) times daily for 5 days.   ipratropium-albuterol 0.5-2.5 (3) MG/3ML Soln Commonly known as: DUONEB Take 3 mLs by nebulization every 6 (six) hours as needed (for shortness of breath/wheezing).   mirtazapine 15 MG tablet Commonly known as: REMERON Take 15  mg by mouth at bedtime.   OXYGEN Inhale 4 L into the lungs at bedtime as needed (for shortness of breath).   polyethylene glycol 17 g packet Commonly known as: MIRALAX / GLYCOLAX Take 17 g by mouth daily.   predniSONE 20 MG tablet Commonly known as: DELTASONE Take 1 tablet (20 mg total) by mouth daily with breakfast for 5 days. Start taking on: September 24, 2021   tamsulosin 0.4 MG Caps capsule Commonly known as: FLOMAX Take 0.8 mg by mouth 2 (two) times daily.   torsemide 20 MG tablet Commonly known as: DEMADEX Take 1 tablet (20 mg total) by mouth daily. What changed: when to take this   Trelegy Ellipta 200-62.5-25 MCG/ACT Aepb Generic drug: Fluticasone-Umeclidin-Vilant Take 1 puff by mouth daily.        Procedures/Studies: DG Chest 2 View  Result Date: 09/21/2021 CLINICAL DATA:  Chest pain for 2 days.  COPD, asthma. EXAM: CHEST - 2 VIEW COMPARISON:  Chest radiograph dated August 22, 2021 FINDINGS: The heart is normal in size. Atherosclerotic calcification of the aortic arch. Mild bilateral bronchial thickening, unchanged. Left basilar opacity which may represent atelectasis or infiltrate. IMPRESSION: 1. Left basilar opacity which may represent atelectasis or infiltrate, similar to prior examination. 2. Hyperinflated lungs with prominent bronchial markings concerning for bronchitis. 3. No  pleural effusion or pneumothorax. Electronically Signed   By: Keane Police D.O.   On: 09/21/2021 16:15     Subjective: Pt had a hard BM stool this morning.  Otherwise no CP and breathing has been much better.  No wheezing or coughing or SOB.   Discharge Exam: Vitals:   09/23/21 0759 09/23/21 1258  BP:  119/65  Pulse:  93  Resp:  18  Temp:  98.4 F (36.9 C)  SpO2: 98% 100%   Vitals:   09/22/21 2241 09/23/21 0531 09/23/21 0759 09/23/21 1258  BP: (!) 113/57 115/71  119/65  Pulse: 69 87  93  Resp: 18 18  18   Temp: 98.2 F (36.8 C) 98.1 F (36.7 C)  98.4 F (36.9 C)  TempSrc: Oral Oral  Oral  SpO2: 100% 100% 98% 100%  Weight:      Height:       General: Pt is alert, awake, not in acute distress Cardiovascular: RRR, S1/S2 +, no rubs, no gallops Respiratory: CTA bilaterally, no wheezing, no rhonchi Abdominal: Soft, NT, ND, bowel sounds + Extremities: no edema, no cyanosis   The results of significant diagnostics from this hospitalization (including imaging, microbiology, ancillary and laboratory) are listed below for reference.     Microbiology: Recent Results (from the past 240 hour(s))  Resp Panel by RT-PCR (Flu A&B, Covid) Nasopharyngeal Swab     Status: None   Collection Time: 09/21/21  8:57 PM   Specimen: Nasopharyngeal Swab; Nasopharyngeal(NP) swabs in vial transport medium  Result Value Ref Range Status   SARS Coronavirus 2 by RT PCR NEGATIVE NEGATIVE Final    Comment: (NOTE) SARS-CoV-2 target nucleic acids are NOT DETECTED.  The SARS-CoV-2 RNA is generally detectable in upper respiratory specimens during the acute phase of infection. The lowest concentration of SARS-CoV-2 viral copies this assay can detect is 138 copies/mL. A negative result does not preclude SARS-Cov-2 infection and should not be used as the sole basis for treatment or other patient management decisions. A negative result may occur with  improper specimen collection/handling, submission of  specimen other than nasopharyngeal swab, presence of viral mutation(s) within the areas targeted by this assay, and  inadequate number of viral copies(<138 copies/mL). A negative result must be combined with clinical observations, patient history, and epidemiological information. The expected result is Negative.  Fact Sheet for Patients:  EntrepreneurPulse.com.au  Fact Sheet for Healthcare Providers:  IncredibleEmployment.be  This test is no t yet approved or cleared by the [place] FDA and  has been authorized for detection and/or diagnosis of SARS-CoV-2 by FDA under an Emergency Use Authorization (EUA). This EUA will remain  in effect (meaning this test can be used) for the duration of the COVID-19 declaration under Section 564(b)(1) of the Act, 21 U.S.C.section 360bbb-3(b)(1), unless the authorization is terminated  or revoked sooner.       Influenza A by PCR NEGATIVE NEGATIVE Final   Influenza B by PCR NEGATIVE NEGATIVE Final    Comment: (NOTE) The Xpert Xpress SARS-CoV-2/FLU/RSV plus assay is intended as an aid in the diagnosis of influenza from Nasopharyngeal swab specimens and should not be used as a sole basis for treatment. Nasal washings and aspirates are unacceptable for Xpert Xpress SARS-CoV-2/FLU/RSV testing.  Fact Sheet for Patients: EntrepreneurPulse.com.au  Fact Sheet for Healthcare Providers: IncredibleEmployment.be  This test is not yet approved or cleared by the [place] FDA and has been authorized for detection and/or diagnosis of SARS-CoV-2 by FDA under an Emergency Use Authorization (EUA). This EUA will remain in effect (meaning this test can be used) for the duration of the COVID-19 declaration under Section 564(b)(1) of the Act, 21 U.S.C. section 360bbb-3(b)(1), unless the authorization is terminated or revoked.  Performed at [facility], [address]  [place], [place] [address]      Labs: BNP (last 3 results) Recent Labs    10/04/20 1602 08/22/21 1309  BNP 1,309.0* 470.9*   Basic Metabolic Panel: Recent Labs  Lab 09/21/21 1622 09/22/21 0334 09/23/21 0540  NA 146* 141 138  K 4.3 4.7 5.1  CL 111 111 107  CO2 26 20* 21*  GLUCOSE 92 120* 171*  BUN 25* 22 29*  CREATININE 1.52* 1.29* 1.38*  CALCIUM 8.9 8.5* 8.5*  MG  --  1.9  --   PHOS  --  3.4  --    Liver Function Tests: Recent Labs  Lab 09/22/21 0334  AST 12*  ALT 6  ALKPHOS 54  BILITOT 0.3  PROT 6.5  ALBUMIN 2.4*   No results for input(s): LIPASE, AMYLASE in the last 168 hours. No results for input(s): AMMONIA in the last 168 hours. CBC: Recent Labs  Lab 09/16/21 1354 09/21/21 1622 09/22/21 0334 09/23/21 0540  WBC 4.8 4.5 3.7* 2.6*  NEUTROABS 1.5  --   --   --   HGB 7.7* 7.7* 7.7* 7.7*  HCT 24.5* 28.8* 27.5* 27.2*  MCV 90 104.3* 101.9* 100.0  PLT 397 385 376 383   Cardiac Enzymes: No results for input(s): CKTOTAL, CKMB, CKMBINDEX, TROPONINI in the last 168 hours. BNP: Invalid input(s): POCBNP CBG: Recent Labs  Lab 09/22/21 1600 09/22/21 2242 09/23/21 0533 09/23/21 0834 09/23/21 1117  GLUCAP 199* 176* 170* 172* 129*   D-Dimer No results for input(s): DDIMER in the last 72 hours. Hgb A1c No results for input(s): HGBA1C in the last 72 hours. Lipid Profile No results for input(s): CHOL, HDL, LDLCALC, TRIG, CHOLHDL, LDLDIRECT in the last 72 hours. Thyroid function studies No results for input(s): TSH, T4TOTAL, T3FREE, THYROIDAB in the last 72 hours.  Invalid input(s): FREET3 Anemia work up Recent Labs    09/22/21 0334 09/23/21 0540  VITAMINB12 274  --  FOLATE 7.6  --   FERRITIN  --  202  TIBC  --  207*  IRON  --  56   Urinalysis    Component Value Date/Time   COLORURINE STRAW (A) 11/15/2020 1909   APPEARANCEUR CLEAR 11/15/2020 1909   LABSPEC 1.008 11/15/2020 1909   PHURINE 6.0 11/15/2020 1909   GLUCOSEU 50 (A) 11/15/2020 1909    HGBUR NEGATIVE 11/15/2020 [place] NEGATIVE 11/15/2020 [place] NEGATIVE 11/15/2020 1909   PROTEINUR NEGATIVE 11/15/2020 1909   NITRITE NEGATIVE 11/15/2020 1909   LEUKOCYTESUR NEGATIVE 11/15/2020 1909   Sepsis Labs Invalid input(s): PROCALCITONIN,  WBC,  LACTICIDVEN Microbiology Recent Results (from the past 240 hour(s))  Resp Panel by RT-PCR (Flu A&B, Covid) Nasopharyngeal Swab     Status: None   Collection Time: 09/21/21  8:57 PM   Specimen: Nasopharyngeal Swab; Nasopharyngeal(NP) swabs in vial transport medium  Result Value Ref Range Status   SARS Coronavirus 2 by RT PCR NEGATIVE NEGATIVE Final    Comment: (NOTE) SARS-CoV-2 target nucleic acids are NOT DETECTED.  The SARS-CoV-2 RNA is generally detectable in upper respiratory specimens during the acute phase of infection. The lowest concentration of SARS-CoV-2 viral copies this assay can detect is 138 copies/mL. A negative result does not preclude SARS-Cov-2 infection and should not be used as the sole basis for treatment or other patient management decisions. A negative result may occur with  improper specimen collection/handling, submission of specimen other than nasopharyngeal swab, presence of viral mutation(s) within the areas targeted by this assay, and inadequate number of viral copies(<138 copies/mL). A negative result must be combined with clinical observations, patient history, and epidemiological information. The expected result is Negative.  Fact Sheet for Patients:  EntrepreneurPulse.com.au  Fact Sheet for Healthcare Providers:  IncredibleEmployment.be  This test is no t yet approved or cleared by the [place] FDA and  has been authorized for detection and/or diagnosis of SARS-CoV-2 by FDA under an Emergency Use Authorization (EUA). This EUA will remain  in effect (meaning this test can be used) for the duration of the COVID-19 declaration under  Section 564(b)(1) of the Act, 21 U.S.C.section 360bbb-3(b)(1), unless the authorization is terminated  or revoked sooner.       Influenza A by PCR NEGATIVE NEGATIVE Final   Influenza B by PCR NEGATIVE NEGATIVE Final    Comment: (NOTE) The Xpert Xpress SARS-CoV-2/FLU/RSV plus assay is intended as an aid in the diagnosis of influenza from Nasopharyngeal swab specimens and should not be used as a sole basis for treatment. Nasal washings and aspirates are unacceptable for Xpert Xpress SARS-CoV-2/FLU/RSV testing.  Fact Sheet for Patients: EntrepreneurPulse.com.au  Fact Sheet for Healthcare Providers: IncredibleEmployment.be  This test is not yet approved or cleared by the [place] FDA and has been authorized for detection and/or diagnosis of SARS-CoV-2 by FDA under an Emergency Use Authorization (EUA). This EUA will remain in effect (meaning this test can be used) for the duration of the COVID-19 declaration under Section 564(b)(1) of the Act, 21 U.S.C. section 360bbb-3(b)(1), unless the authorization is terminated or revoked.  Performed at [facility], [address] [place], [place] [address]    Time coordinating discharge: 35 mins   SIGNED:  Irwin Brakeman, MD  [facility] 09/23/2021, 1:42 PM How to contact the [facility] Attending or Consulting provider [place] or covering provider during after hours [place], for this patient?  Check the care team in [facility] and look for a) attending/consulting [facility]  provider listed and b) the [facility] team listed Log into [email or web address] and use [facility]'s universal password to access. If you do not have the password, please contact the hospital operator. Locate the [facility] provider you are looking for under [facility] and page to a number that you can be directly reached. If you still have difficulty reaching the provider, please page the [facility] (Director on Call) for the Hospitalists listed on amion for  assistance.

## 2021-09-23 NOTE — Progress Notes (Signed)
Pt has discharge orders, discharge orders given and no further questions at this time. Pt wheeled outside to main entrance to vehicle accompanied by son.

## 2021-09-23 NOTE — Consult Note (Signed)
Referring Provider: No ref. provider found Primary Care Physician:  Neale Burly, MD Primary Gastroenterologist:  Dr.Carver  Date of Admission: 09/22/21 Date of Consultation: 09/23/21  Reason for Consultation:  anemia  HPI:  Gregory Mitchell is a 81 y.o. year old male with history of combined systolic and diagstolic heart failure, HTN, PE on apixaban, DM Type II, CKD stage IIIa who presented to the ED on 09/21/21 with 3 days history of intermittent non radiating mid sternal chest pain described as squeezing in nature with some associated SOB, along with productive cough. Reported pain sometimes worse after PO intake.   Recent admission 1/14 to 1/17 with acute blood loss anemia/symptomatic anemia, with melena, EGD done at that time with severe candidiasis which was treated with Diflucan, with hx of recurrent candidal esophagitis, treated with Diflucan x4. Notably, HIV testing was negative.  ED course: WBC 3.7 BUN 25 initially, improved to 22  COVID and Flu testing negative Chest xray with L basilar opacity which may represent atelectasis or infiltrate, similar to prior exam. Hyperinflated lungs with prominent bronchial markings concerning for bronchitis. Hgb 7.7 with MCV 101.9, B12 and Folate WNL  Consult: FOBT Negative, IRON studies WNL  Patient states that he presented to the ED due to worsening shortness of breath and chest pain. He reports that he has had some issues with constipation recently and endorses having to strain quite a bit to have a BM. He has no noticed any black stools but has had some paper hematochezia on occasion after straining. No blood in toilet or mixed in with stool, per patient. He does endorse some rectal discomfort at times. Patient's Nurse today states that patient was given 2 suppositories in the past 24 hours r/t constipation. He did have a small BM this morning that he had to strain to have, reportedly BM this morning was green and somewhat dark/black? In color,  though patient later tells me that he has been taking iron pills at home for the past 6 days. Denies nausea or vomiting. He states constipation has been ongoing x6-7 days as well. He takes metamucil at home but does not notice much relief from this. He takes occasional BC powder, maybe 3-4 per month for headaches but no other NSAIDs. He denies any issues with dysphagia or odynophagia. Notably down 13 kg since last admission one month ago. He states that appetite has been decreased recently and feels this may be related to the respiratory issues he has been having as he previously was eating well and had a very good appetite. Denies issues with heartburn/acid regurgitation. Denies early satiety or bloating. Has some occasional abdominal pain near his umbilicus but this usually improves with having a BM or passing flatulence which he is doing.  Last EGD:08/24/21- Moderately severe candidiasis esophagitis with no bleeding. Cells for cytology obtained. - Gastritis. Biopsied. - Normal duodenal bulb, first portion of the duodenum and second portion of the duodenum.  Flex sig: 08/24/21- Sacral decubitus ulcer found on perianal exam. - The recto-sigmoid colon, sigmoid colon and descending colon are normal. - No specimens collected.  Colonoscopy March 2008: normal,  no specimens Past Medical History:  Diagnosis Date   Asthma    Chronic renal insufficiency    COPD (chronic obstructive pulmonary disease) (HCC)    Heart failure (HCC)    Acute on chronic combined systolic and diastolic heart failure   Hypertension    Pulmonary emboli ([place]) 03/2019   Renal insufficiency    Type 2 diabetes  mellitus [facility])     Past Surgical History:  Procedure Laterality Date   APPENDECTOMY     BALLOON DILATION N/A 03/18/2020   Procedure: BALLOON DILATION;  Surgeon: Eloise Harman, DO;  [facility];  Service: Endoscopy;  Laterality: N/A;   CHOLECYSTECTOMY     COLONOSCOPY  10/2006   Dr.Anwar:normal    ESOPHAGEAL BRUSHING  08/24/2021   Procedure: ESOPHAGEAL BRUSHING;  Surgeon: Eloise Harman, DO;  [facility];  Service: Endoscopy;;   ESOPHAGOGASTRODUODENOSCOPY (EGD) WITH PROPOFOL N/A 03/18/2020   Procedure: ESOPHAGOGASTRODUODENOSCOPY (EGD) WITH PROPOFOL;  Surgeon: Eloise Harman, DO;  [facility];  Service: Endoscopy;  Laterality: N/A;  2:30pm   ESOPHAGOGASTRODUODENOSCOPY (EGD) WITH PROPOFOL N/A 08/24/2021   Procedure: ESOPHAGOGASTRODUODENOSCOPY (EGD) WITH PROPOFOL;  Surgeon: Eloise Harman, DO;  [facility];  Service: Endoscopy;  Laterality: N/A;  with dilation   FLEXIBLE SIGMOIDOSCOPY N/A 08/24/2021   Procedure: FLEXIBLE SIGMOIDOSCOPY;  Surgeon: Eloise Harman, DO;  [facility];  Service: Endoscopy;  Laterality: N/A;   NASAL SINUS SURGERY     NEPHRECTOMY     RIGHT (Question CA.  No further therapy needed.)   RIGHT/LEFT HEART CATH AND CORONARY ANGIOGRAPHY N/A 09/03/2019   Procedure: RIGHT/LEFT HEART CATH AND CORONARY ANGIOGRAPHY;  Surgeon: Lorretta Harp, MD;  Location: [place] [facility];  Service: Cardiovascular;  Laterality: N/A;    Prior to Admission medications   Medication Sig Start Date End Date Taking? Authorizing Provider  albuterol (PROVENTIL) (5 MG/ML) 0.5% nebulizer solution Inhale 2.5 mg into the lungs every 4 (four) hours as needed for shortness of breath or wheezing. 06/03/21 06/03/22 Yes [provider]  albuterol (VENTOLIN HFA) 108 (90 Base) MCG/ACT inhaler Inhale 2 puffs into the lungs every 6 (six) hours as needed for wheezing or shortness of breath.    Yes [provider]  dapagliflozin propanediol (FARXIGA) 10 MG TABS tablet Take 1 tablet (10 mg total) by mouth daily before breakfast. 10/30/20  Yes Clegg, Amy D, NP  diclofenac Sodium (VOLTAREN) 1 % GEL Apply 1 application topically 4 (four) times daily as needed (pain). 08/29/20  Yes [provider]  ferrous sulfate 325 (65 FE) MG tablet  Take 1 tablet (325 mg total) by mouth daily. 09/17/21 09/17/22 Yes Harper, Kristen S, PA-C  HM SENNA-S 8.6-50 MG tablet Take 2 tablets by mouth daily. 07/10/21  Yes [provider]  HYDROcodone-acetaminophen (NORCO) 7.5-325 MG tablet Take 1 tablet by mouth 2 (two) times daily as needed for moderate pain. 11/27/20  Yes [provider]  ipratropium-albuterol (DUONEB) 0.5-2.5 (3) MG/3ML SOLN Take 3 mLs by nebulization every 6 (six) hours as needed (for shortness of breath/wheezing).    Yes [provider]  mirtazapine (REMERON) 15 MG tablet Take 15 mg by mouth at bedtime.   Yes [provider]  OXYGEN Inhale 4 L into the lungs at bedtime as needed (for shortness of breath).   Yes [provider]  sacubitril-valsartan (ENTRESTO) 24-26 MG Take 1 tablet by mouth 2 (two) times daily.   Yes [provider]  tamsulosin (FLOMAX) 0.4 MG CAPS capsule Take 0.8 mg by mouth 2 (two) times daily.   Yes [provider]  Donnal Debar 200-62.5-25 MCG/ACT AEPB Take 1 puff by mouth daily. 07/27/21  Yes [provider]  apixaban (ELIQUIS) 5 MG TABS tablet Take 5 mg by mouth 2 (two) times daily. Patient not taking: Reported on 09/22/2021    [provider]  carvedilol (COREG) 6.25 MG tablet Take 1 tablet (6.25 mg total) by mouth 2 (two) times daily. Patient not taking: Reported on 09/22/2021 12/01/20   Arnoldo Lenis, MD  esomeprazole (NEXIUM) 40 MG capsule Take 1 capsule (40 mg total) by mouth 2 (two) times daily before a meal. Patient not taking: Reported on 09/22/2021 04/30/21 04/30/22  Eloise Harman, DO  torsemide (DEMADEX) 20 MG tablet Take 1 tablet (20 mg total) by mouth 2 (two) times daily. Patient not taking: Reported on 08/22/2021 03/10/21   Arnoldo Lenis, MD    Current Facility-Administered Medications  Medication Dose Route Frequency Provider Last Rate Last Admin   acetaminophen (TYLENOL) tablet 650 mg  650 mg Oral Q6H PRN  Adefeso, Oladapo, DO       Or   acetaminophen (TYLENOL) suppository 650 mg  650 mg Rectal Q6H PRN Adefeso, Oladapo, DO       albuterol (PROVENTIL) (2.5 MG/3ML) 0.083% nebulizer solution 2.5 mg  2.5 mg Nebulization Q2H PRN Emokpae, Courage, MD       cefTRIAXone (ROCEPHIN) 1 g in sodium chloride 0.9 % 100 mL IVPB  1 g Intravenous Q24H Emokpae, Courage, MD 200 mL/hr at 09/23/21 0848 1 g at 09/23/21 0848   dapagliflozin propanediol (FARXIGA) tablet 5 mg  5 mg Oral Daily Emokpae, Courage, MD       dextromethorphan-guaiFENesin (MUCINEX DM) 30-600 MG per 12 hr tablet 1 tablet  1 tablet Oral BID Adefeso, Oladapo, DO   1 tablet at 09/22/21 2129   doxycycline (VIBRA-TABS) tablet 100 mg  100 mg Oral Q12H Emokpae, Courage, MD   100 mg at 09/23/21 0854   fluticasone furoate-vilanterol (BREO ELLIPTA) 200-25 MCG/ACT 1 puff  1 puff Inhalation Daily Adefeso, Oladapo, DO   1 puff at 09/23/21 0758   And   umeclidinium bromide (INCRUSE ELLIPTA) 62.5 MCG/ACT 1 puff  1 puff Inhalation Daily Adefeso, Oladapo, DO   1 puff at 09/23/21 0758   insulin aspart (novoLOG) injection 0-5 Units  0-5 Units Subcutaneous QHS Emokpae, Courage, MD       insulin aspart (novoLOG) injection 0-6 Units  0-6 Units Subcutaneous TID WC Emokpae, Courage, MD   1 Units at 09/23/21 0853   ipratropium-albuterol (DUONEB) 0.5-2.5 (3) MG/3ML nebulizer solution 3 mL  3 mL Nebulization TID Adefeso, Oladapo, DO   3 mL at 09/23/21 0758   methylPREDNISolone sodium succinate (SOLU-MEDROL) 40 mg/mL injection 40 mg  40 mg Intravenous BID Adefeso, Oladapo, DO   40 mg at 09/23/21 0856   pantoprazole (PROTONIX) EC tablet 40 mg  40 mg Oral Daily Adefeso, Oladapo, DO   40 mg at 09/23/21 0854   polyethylene glycol (MIRALAX / GLYCOLAX) packet 17 g  17 g Oral BID Denton Brick, Courage, MD   17 g at 09/22/21 2129   sacubitril-valsartan (ENTRESTO) 24-26 mg per tablet  1 tablet Oral BID Roxan Hockey, MD   1 tablet at 09/23/21 0853   torsemide (DEMADEX) tablet 20 mg  20 mg  Oral Daily Emokpae, Courage, MD   20 mg at 09/23/21 0854    Allergies as of 09/21/2021 - Review Complete 09/21/2021  Allergen Reaction Noted   Amoxicillin Other (See Comments) 11/26/2011   Ciprofloxacin  11/26/2011   Sulfa antibiotics Rash 11/26/2011    Family History  Problem Relation Age of Onset   Lung cancer Brother    Stroke Mother    Breast cancer Mother    Colon cancer Neg Hx     Social History  Socioeconomic History   Marital status: Widowed    Spouse name: Not on file   Number of children: Not on file   Years of education: Not on file   Highest education level: Not on file  Occupational History   Not on file  Tobacco Use   Smoking status: Former    Packs/day: 1.00    Years: 15.00    Pack years: 15.00    Types: Cigarettes    Start date: 07/14/1957    Quit date: 08/09/1976    Years since quitting: 45.1   Smokeless tobacco: Former    Types: Chew    Quit date: 02/10/2006   Tobacco comments:    chewed tobacco for 10 years  Vaping Use   Vaping Use: Never used  Substance and Sexual Activity   Alcohol use: Not Currently    Alcohol/week: 0.0 standard drinks    Comment: no etoh since he was in his 50s   Drug use: Never   Sexual activity: Yes  Other Topics Concern   Not on file  Social History Narrative   Lives with wife.  Three children.     Social Determinants of Health   Financial Resource Strain: Not on file  Food Insecurity: Not on file  Transportation Needs: Not on file  Physical Activity: Not on file  Stress: Not on file  Social Connections: Not on file  Intimate Partner Violence: Not on file   Review of Systems: Gen: Denies fever, chills, +decreased appetite +weight loss CV: Denies chest pain, heart palpitations, syncope, edema  Resp: +SOB +cough GI: denies melena, nausea, vomiting, diarrhea, dysphagia, odyonophagia, early satiety or weight loss. +constipation +paper hematochezia GU : Denies urinary burning, urinary frequency, urinary  incontinence.  MS: Denies joint pain,swelling, cramping Derm: Denies rash, itching, dry skin Psych: Denies depression, anxiety,confusion, or memory loss Heme: Denies bruising, bleeding, and enlarged lymph nodes.  Physical Exam: Vital signs in last 24 hours: Temp:  [97.9 F (36.6 C)-98.6 F (37 C)] 98.1 F (36.7 C) (02/15 0531) Pulse Rate:  [69-87] 87 (02/15 0531) Resp:  [15-18] 18 (02/15 0531) BP: (113-128)/(57-79) 115/71 (02/15 0531) SpO2:  [98 %-100 %] 98 % (02/15 0759) Weight:  [56.9 kg] 56.9 kg (02/14 0939) Last BM Date : 09/21/21 General:   Alert,  Well-developed, well-nourished, pleasant and cooperative in NAD Head:  Normocephalic and atraumatic. Eyes:  Sclera clear, no icterus.   Conjunctiva pink. Ears:  Normal auditory acuity. Nose:  No deformity, discharge,  or lesions. Mouth:  No deformity or lesions, dentition normal. Lungs:  Clear throughout to auscultation.   No wheezes, crackles, or rhonchi. No acute distress. Heart:  Regular rate and rhythm; no murmurs, clicks, rubs,  or gallops. Abdomen:  Soft, nontender and nondistended. No masses, hepatosplenomegaly or hernias noted. Normal bowel sounds, without guarding, and without rebound.   Rectal:  Deferred until time of colonoscopy.   Msk:  Symmetrical without gross deformities. Normal posture. Pulses:  Normal pulses noted. Extremities:  Without clubbing or edema. Neurologic:  Alert and  oriented x4;  grossly normal neurologically. Skin:  Intact without significant lesions or rashes. Psych:  Alert and cooperative. Normal mood and affect.  Intake/Output from previous day: 02/14 0701 - 02/15 0700 In: 540 [P.O.:540] Out: 400 [Urine:400] Intake/Output this shift: No intake/output data recorded.  Lab Results: Recent Labs    09/21/21 1622 09/22/21 0334 09/23/21 0540  WBC 4.5 3.7* 2.6*  HGB 7.7* 7.7* 7.7*  HCT 28.8* 27.5* 27.2*  PLT 385 376 383  BMET Recent Labs    09/21/21 1622 09/22/21 0334 09/23/21 0540   NA 146* 141 138  K 4.3 4.7 5.1  CL 111 111 107  CO2 26 20* 21*  GLUCOSE 92 120* 171*  BUN 25* 22 29*  CREATININE 1.52* 1.29* 1.38*  CALCIUM 8.9 8.5* 8.5*   LFT Recent Labs    09/22/21 0334  PROT 6.5  ALBUMIN 2.4*  AST 12*  ALT 6  ALKPHOS 54  BILITOT 0.3   Studies/Results: DG Chest 2 View  Result Date: 09/21/2021 CLINICAL DATA:  Chest pain for 2 days.  COPD, asthma. EXAM: CHEST - 2 VIEW COMPARISON:  Chest radiograph dated August 22, 2021 FINDINGS: The heart is normal in size. Atherosclerotic calcification of the aortic arch. Mild bilateral bronchial thickening, unchanged. Left basilar opacity which may represent atelectasis or infiltrate. IMPRESSION: 1. Left basilar opacity which may represent atelectasis or infiltrate, similar to prior examination. 2. Hyperinflated lungs with prominent bronchial markings concerning for bronchitis. 3. No pleural effusion or pneumothorax. Electronically Signed   By: Keane Police D.O.   On: 09/21/2021 16:15    Impression: Gregory Mitchell is a 81 y.o. year old male with history of combined systolic and diagstolic heart failure, HTN, PE on apixaban, DM Type II, CKD stage IIIa who presented to the ED on 09/21/21 chest pain, sob and cough. Hgb found to be 7.7 on admission. GI consulted for further evaluation, notably history of recurrent candidal esophagitis, last episode in mid January, treated with Diflucan.   Anemia: macrocytic. Likely multifactorial related to chronic disease process. Hemoglobin remains stable at 7.7 and appears to have been in the 7-8 range x1 month, MCV is 101.9, appears baseline hgb is around 10. Iron studies are WNL, notably TIBC is low which does not correlate with IDA. FOBT negative. notably patient did tell me after iron studies ordered that he has been taking some otc iron pills for the past 6 days as he felt SOB may be related to iron deficiency. NT reports small BM this morning that was dark and somewhat green in color. Occasional  toilet tissue hematochezia when straining to have BM during worse episodes of constipation, likely secondary to hemorrhoids as he notes some rectal pain associated with this. Recently treated 1 month ago with diflucan 21 day course for recurrent candidal esophagitis, though no stigmata of bleeding found. Flex sig also done at that time was unremarkable. Patient should follow up with primary GI provider on outpatient basis, no endoscopic evaluation warranted at this time.   Constipation: can continue miralax BID with good result, consider increasing to one capful every 8 hours if no improvement in stool consistency with BID dosing after 1 week. May consider linzess or other constipation alternatives if miralax does not provide results. This can be further discussed on outpatient basis as well.   Plan: Continue miralax BID, can increase to TID if no results with BID dosing may need to consider other alternatives such as linzess if no result from miralax Advance diet as tolerated Follow up outpatient with Dr. Abbey Chatters at [facility], patient has appt scheduled for 09/30/21  GI will sign off   LOS: 0 days    09/23/2021, 9:28 AM  Arshad Oberholzer L. Alver Sorrow, MSN, APRN, AGNP-C Adult-Gerontology Nurse Practitioner [facility]

## 2021-09-23 NOTE — Plan of Care (Signed)

## 2021-09-23 NOTE — Progress Notes (Signed)
Patient complained of abdominal pain, patient stated he felt the urge to have a bowel movement but unable to pass a bowel movement. MD Wynetta Emery aware. New order for suppository, see MAR. Patient complained of rectum hurting after using the bathroom, he stated the stool was hard. Noted nothing on the outside of rectum. MD Wynetta Emery made aware. New orders placed.

## 2021-09-25 NOTE — ED Provider Notes (Signed)
[facility] Provider Note   CSN: [id number] Arrival date & time: 09/21/21  1524     History  Chief Complaint  Patient presents with   Chest Pain    Gregory Mitchell is a 81 y.o. male.  Patient with history of PE complains of chest pain.  No shortness of breath  The history is provided by the patient. No language interpreter was used.  Chest Pain Pain location:  L chest Pain quality: aching   Pain radiates to:  Does not radiate Pain severity:  Moderate Onset quality:  Sudden Timing:  Constant Progression:  Worsening Chronicity:  New Context: not breathing   Relieved by:  Nothing Worsened by:  Nothing Associated symptoms: no abdominal pain, no back pain, no cough, no fatigue and no headache       Home Medications Prior to Admission medications   Medication Sig Start Date End Date Taking? Authorizing Provider  albuterol (PROVENTIL) (5 MG/ML) 0.5% nebulizer solution Inhale 2.5 mg into the lungs every 4 (four) hours as needed for shortness of breath or wheezing. 06/03/21 06/03/22 Yes [provider]  albuterol (VENTOLIN HFA) 108 (90 Base) MCG/ACT inhaler Inhale 2 puffs into the lungs every 6 (six) hours as needed for wheezing or shortness of breath.    Yes [provider]  dapagliflozin propanediol (FARXIGA) 10 MG TABS tablet Take 1 tablet (10 mg total) by mouth daily before breakfast. 10/30/20  Yes Clegg, Amy D, NP  diclofenac Sodium (VOLTAREN) 1 % GEL Apply 1 application topically 4 (four) times daily as needed (pain). 08/29/20  Yes [provider]  ferrous sulfate 325 (65 FE) MG tablet Take 1 tablet (325 mg total) by mouth daily. 09/17/21 09/17/22 Yes Harper, Kristen S, PA-C  HM SENNA-S 8.6-50 MG tablet Take 2 tablets by mouth daily. 07/10/21  Yes [provider]  HYDROcodone-acetaminophen (NORCO) 7.5-325 MG tablet Take 1 tablet by mouth 2 (two) times daily as needed for moderate pain. 11/27/20  Yes [provider]   ipratropium-albuterol (DUONEB) 0.5-2.5 (3) MG/3ML SOLN Take 3 mLs by nebulization every 6 (six) hours as needed (for shortness of breath/wheezing).    Yes [provider]  mirtazapine (REMERON) 15 MG tablet Take 15 mg by mouth at bedtime.   Yes [provider]  OXYGEN Inhale 4 L into the lungs at bedtime as needed (for shortness of breath).   Yes [provider]  sacubitril-valsartan (ENTRESTO) 24-26 MG Take 1 tablet by mouth 2 (two) times daily.   Yes [provider]  tamsulosin (FLOMAX) 0.4 MG CAPS capsule Take 0.8 mg by mouth 2 (two) times daily.   Yes [provider]  Donnal Debar 200-62.5-25 MCG/ACT AEPB Take 1 puff by mouth daily. 07/27/21  Yes [provider]  doxycycline (VIBRA-TABS) 100 MG tablet Take 1 tablet (100 mg total) by mouth every 12 (twelve) hours for 3 days. 09/23/21 09/26/21  Johnson, Clanford L, MD  esomeprazole (NEXIUM) 40 MG capsule Take 1 capsule (40 mg total) by mouth daily. 09/23/21 09/23/22  Murlean Iba, MD  hydrocortisone-pramoxine [facility]) rectal foam Place 1 applicator rectally 2 (two) times daily for 5 days. 09/23/21 09/28/21  Johnson, Clanford L, MD  polyethylene glycol (MIRALAX / GLYCOLAX) 17 g packet Take 17 g by mouth daily. 09/23/21   Johnson, Clanford L, MD  predniSONE (DELTASONE) 20 MG tablet Take 1 tablet (20 mg total) by mouth daily with breakfast for 5 days. 09/24/21 09/29/21  Murlean Iba, MD  torsemide (  DEMADEX) 20 MG tablet Take 1 tablet (20 mg total) by mouth daily. 09/23/21   Johnson, Clanford L, MD      Allergies    Amoxicillin, Ciprofloxacin, and Sulfa antibiotics    Review of Systems   Review of Systems  Constitutional:  Negative for appetite change and fatigue.  HENT:  Negative for congestion, ear discharge and sinus pressure.   Eyes:  Negative for discharge.  Respiratory:  Negative for cough.   Cardiovascular:  Positive for chest pain.  Gastrointestinal:  Negative for  abdominal pain and diarrhea.  Genitourinary:  Negative for frequency and hematuria.  Musculoskeletal:  Negative for back pain.  Skin:  Negative for rash.  Neurological:  Negative for seizures and headaches.  Psychiatric/Behavioral:  Negative for hallucinations.    Physical Exam Updated Vital Signs BP 119/65 (BP Location: Right Arm)    Pulse 93    Temp 98.4 F (36.9 C) (Oral)    Resp 18    Ht 5\' 6"  (1.676 m)    Wt 56.9 kg    SpO2 98%    BMI 20.24 kg/m  Physical Exam Vitals and nursing note reviewed. Exam conducted with a chaperone present.  Constitutional:      Appearance: He is well-developed.  HENT:     Head: Normocephalic.     Nose: Nose normal.  Eyes:     General: No scleral icterus.    Conjunctiva/sclera: Conjunctivae normal.  Neck:     Thyroid: No thyromegaly.  Cardiovascular:     Rate and Rhythm: Normal rate and regular rhythm.     Heart sounds: No murmur heard.   No friction rub. No gallop.  Pulmonary:     Breath sounds: No stridor. No wheezing or rales.  Chest:     Chest wall: No tenderness.  Abdominal:     General: There is no distension.     Tenderness: There is no abdominal tenderness. There is no rebound.  Musculoskeletal:        General: Normal range of motion.     Cervical back: Neck supple.  Lymphadenopathy:     Cervical: No cervical adenopathy.  Skin:    Findings: No erythema or rash.  Neurological:     Mental Status: He is alert and oriented to person, place, and time.     Motor: No abnormal muscle tone.     Coordination: Coordination normal.  Psychiatric:        Behavior: Behavior normal.    ED Results / Procedures / Treatments   Labs (all labs ordered are listed, but only abnormal results are displayed) Labs Reviewed  BASIC METABOLIC PANEL - Abnormal; Notable for the following components:      Result Value   Sodium 146 (*)    BUN 25 (*)    Creatinine, Ser 1.52 (*)    GFR, Estimated 46 (*)    All other components within normal limits  CBC  - Abnormal; Notable for the following components:   RBC 2.76 (*)    Hemoglobin 7.7 (*)    HCT 28.8 (*)    MCV 104.3 (*)    MCHC 26.7 (*)    RDW 16.9 (*)    All other components within normal limits  COMPREHENSIVE METABOLIC PANEL - Abnormal; Notable for the following components:   CO2 20 (*)    Glucose, Bld 120 (*)    Creatinine, Ser 1.29 (*)    Calcium 8.5 (*)    Albumin 2.4 (*)    AST 12 (*)  GFR, Estimated 56 (*)    All other components within normal limits  CBC - Abnormal; Notable for the following components:   WBC 3.7 (*)    RBC 2.70 (*)    Hemoglobin 7.7 (*)    HCT 27.5 (*)    MCV 101.9 (*)    MCHC 28.0 (*)    RDW 16.4 (*)    All other components within normal limits  GLUCOSE, CAPILLARY - Abnormal; Notable for the following components:   Glucose-Capillary 178 (*)    All other components within normal limits  GLUCOSE, CAPILLARY - Abnormal; Notable for the following components:   Glucose-Capillary 199 (*)    All other components within normal limits  CBC - Abnormal; Notable for the following components:   WBC 2.6 (*)    RBC 2.72 (*)    Hemoglobin 7.7 (*)    HCT 27.2 (*)    MCHC 28.3 (*)    RDW 16.3 (*)    All other components within normal limits  BASIC METABOLIC PANEL - Abnormal; Notable for the following components:   CO2 21 (*)    Glucose, Bld 171 (*)    BUN 29 (*)    Creatinine, Ser 1.38 (*)    Calcium 8.5 (*)    GFR, Estimated 51 (*)    All other components within normal limits  GLUCOSE, CAPILLARY - Abnormal; Notable for the following components:   Glucose-Capillary 176 (*)    All other components within normal limits  GLUCOSE, CAPILLARY - Abnormal; Notable for the following components:   Glucose-Capillary 170 (*)    All other components within normal limits  GLUCOSE, CAPILLARY - Abnormal; Notable for the following components:   Glucose-Capillary 172 (*)    All other components within normal limits  IRON AND TIBC - Abnormal; Notable for the  following components:   TIBC 207 (*)    All other components within normal limits  GLUCOSE, CAPILLARY - Abnormal; Notable for the following components:   Glucose-Capillary 129 (*)    All other components within normal limits  CBG MONITORING, ED - Abnormal; Notable for the following components:   Glucose-Capillary 138 (*)    All other components within normal limits  TROPONIN I (HIGH SENSITIVITY) - Abnormal; Notable for the following components:   Troponin I (High Sensitivity) 19 (*)    All other components within normal limits  TROPONIN I (HIGH SENSITIVITY) - Abnormal; Notable for the following components:   Troponin I (High Sensitivity) 18 (*)    All other components within normal limits  RESP PANEL BY RT-PCR (FLU A&B, COVID) ARPGX2  MAGNESIUM  PHOSPHORUS  FOLATE  VITAMIN B12  FERRITIN  OCCULT BLOOD X 1 CARD TO LAB, STOOL    EKG None  Radiology No results found.  Procedures Procedures    Medications Ordered in ED Medications  oxyCODONE-acetaminophen (PERCOCET/ROXICET) 5-325 MG per tablet 1 tablet (1 tablet Oral Given 09/21/21 2018)  bisacodyl (DULCOLAX) suppository 10 mg (10 mg Rectal Given 09/22/21 1345)  bisacodyl (DULCOLAX) suppository 10 mg (10 mg Rectal Given 09/23/21 0844)    ED Course/ Medical Decision Making/ A&P                           Medical Decision Making Amount and/or Complexity of Data Reviewed Labs: ordered. Radiology: ordered.  Risk Prescription drug management. Decision regarding hospitalization.   Patient with atypical chest pain and minimally elevated troponins.  He will be admitted to medicine.  Patient also with chronic anemia   This patient presents to the ED for concern of chest pain, this involves an extensive number of treatment options, and is a complaint that carries with it a high risk of complications and morbidity.  The differential diagnosis includes MI, PE, pneumonia   Co morbidities that complicate the patient  evaluation  PE, diabetes   Additional history obtained:  Additional history obtained from patient External records from outside source obtained and reviewed including hospital records   Lab Tests:  I Ordered, and personally interpreted labs.  The pertinent results include: CBC that showed anemia of 7.7, mildly elevated troponin   Imaging Studies ordered:  I ordered imaging studies including chest x-ray I independently visualized and interpreted imaging which showed left basilar opacity I agree with the radiologist interpretation   Cardiac Monitoring:  The patient was maintained on a cardiac monitor.  I personally viewed and interpreted the cardiac monitored which showed an underlying rhythm of: Normal sinus rhythm   Medicines ordered and prescription drug management:  I ordered medication including Percocet for pain Reevaluation of the patient after these medicines showed that the patient stayed the same I have reviewed the patients home medicines and have made adjustments as needed   Test Considered:  CT chest   Critical Interventions:  None   Consultations Obtained:  I requested consultation with the hospitalist,  and discussed lab and imaging findings as well as pertinent plan - they recommend: Admission and further work-up for chest pain   Problem List / ED Course:  Chest pain diabetes   Reevaluation:  After the interventions noted above, I reevaluated the patient and found that they have :stayed the same   Social Determinants of Health:  None   Dispostion:  After consideration of the diagnostic results and the patients response to treatment, I feel that the patent would benefit from admission for chest pain.          Final Clinical Impression(s) / ED Diagnoses Final diagnoses:  None    Rx / DC Orders ED Discharge Orders          Ordered    predniSONE (DELTASONE) 20 MG tablet  Daily with breakfast        09/23/21 1338     torsemide (DEMADEX) 20 MG tablet  Daily       Note to Pharmacy: Stopping Furosemide, med changed 03/10/2021   09/23/21 1338    polyethylene glycol (MIRALAX / GLYCOLAX) 17 g packet  Daily        09/23/21 1338    esomeprazole (NEXIUM) 40 MG capsule  Daily        09/23/21 1338    hydrocortisone-pramoxine (PROCTOFOAM-HC) rectal foam  2 times daily       Note to Pharmacy: Pharmacist can substitute   09/23/21 1338    doxycycline (VIBRA-TABS) 100 MG tablet  Every 12 hours        09/23/21 1338              Milton Ferguson, MD 09/25/21 [phone]

## 2021-09-30 ENCOUNTER — Ambulatory Visit: Payer: Medicare HMO | Admitting: Internal Medicine

## 2021-10-05 ENCOUNTER — Ambulatory Visit: Payer: Medicare HMO | Admitting: Internal Medicine

## 2021-10-05 ENCOUNTER — Encounter: Payer: Self-pay | Admitting: Internal Medicine

## 2021-10-05 ENCOUNTER — Other Ambulatory Visit: Payer: Self-pay

## 2021-10-05 VITALS — BP 130/62 | HR 81 | Ht 66.0 in | Wt 143.6 lb

## 2021-10-05 DIAGNOSIS — I428 Other cardiomyopathies: Secondary | ICD-10-CM

## 2021-10-05 DIAGNOSIS — R079 Chest pain, unspecified: Secondary | ICD-10-CM | POA: Diagnosis not present

## 2021-10-05 DIAGNOSIS — I5023 Acute on chronic systolic (congestive) heart failure: Secondary | ICD-10-CM | POA: Diagnosis not present

## 2021-10-05 MED ORDER — ENTRESTO 49-51 MG PO TABS: 1.0000 | ORAL_TABLET | Freq: Two times a day (BID) | ORAL | 6 refills | Status: DC

## 2021-10-05 NOTE — Patient Instructions (Signed)
Medication Instructions:  INCREASE  Entresto to 49/51 mg twice a day   Labwork: None today  Testing/Procedures: Your physician has requested that you have an echocardiogram. Echocardiography is a painless test that uses sound waves to create images of your heart. It provides your doctor with information about the size and shape of your heart and how well your hearts chambers and valves are working. This procedure takes approximately one hour. There are no restrictions for this procedure.   Follow-Up: 3 months  Any Other Special Instructions Will Be Listed Below (If Applicable).  If you need a refill on your cardiac medications before your next appointment, please call your pharmacy.

## 2021-10-05 NOTE — Progress Notes (Signed)
Cardiology Office Note  Date: 10/05/2021   ID: Gregory Mitchell, DOB 07/06/1941, MRN [id number]  PCP:  Neale Burly, MD  Cardiologist:  Carlyle Dolly, MD Electrophysiologist:  None   Chief Complaint: Hospital follow-up  History of Present Illness: Gregory Mitchell is a 81 y.o. male with a history of acute on chronic systolic and diastolic heart failure, COPD, HTN, DM2, renal insufficiency.   He had a recent emergency room visit to [facility] ED on 02/16/2021 with complaint of chest pain, joint swelling, edema to the lower legs for several days.  Chest x-ray was abnormal with possible pneumonia versus atelectasis.  Oral antibiotics and steroids were initiated.   Hemoglobin was 9.4 and hematocrit 32.6.  Creatinine 1.38, GFR 52.    He recently saw pulmonary at [facility] Dr. Dorris Singh on 03/02/2021, who started him on Trelegy.  They collected a sputum sample for culture for mycobacteria, fungus, culture for Gram stain, CBC with differential, CMP, IgE zone 2 respiratory profile.  Culture result was 2+ mixed gram-negative rods, yeast, not cryptococcus, exophiala.  Apparently he had been seen previously in 2006 where he was diagnosed with severe persistent asthma with fixed airway obstruction and bronchiectasis.  Also had recurrent sinusitis and acid reflux and was status post Nissen fundoplication.  He was using as needed O2 to 3 L during sleep   He had PFT's indicating moderate obstructive disease and air trapping per provider note at [facility].  Recent CT of abdomen and pelvis on 12/26/2020 at [facility] showed interval worsening of bibasilar findings likely representing infection/inflammation, pulmonary nodules measure up to 7 mm.  Noncontrast CT it 3 to 6 months was recommended.  If nodules were stable at time of repeat CT then future CT at 18 to 24 months considered optional for low risk patient but recommended for high risk patient   He was last seen by Dr. Harl Bowie on 03/10/2021.  He was having  recent increase in lower extremity edema.  Had lost a significant amount of weight this year due to failure to thrive.[name]  He was taking Lasix 40 mg p.o. twice daily with mild improvement.  Had a recent ER visit on 02/16/2021 for possible pneumonia on chest x-ray.  He was started on steroids and antibiotic.  On exam patient had significant 2+ edema and elevated JVD.  He had recently lost a significant amount of weight around 25 pounds due to failure to thrive.  He was back closer to his previous baseline of 145 to 155 pounds.  Plan was to DC Lasix and start torsemide 20 mg p.o. twice daily.  Plan was for patient to call with weights and update on swelling on Friday.  Plan was to order a basic metabolic panel and magnesium once he started diuresing.  Could add a BNP.  He is here for follow-up after starting torsemide at last visit with Dr. Harl Bowie.  He states he is feeling much better and has lost a fair amount of weight (12 pounds) since last visit.  At last visit he weighed 153 pounds.  Today he weighs 141 pounds.  He states the swelling in his legs has resolved.  He states his breathing is better.  He states he has an upcoming appointment on 05/04/2021 with a lung specialist at [facility] (not sure if he means [facility]) due to his ongoing chronic cough with continued yellow sputum production.  10/05/2021  Gregory Mitchell returns today for hospital follow-up.  He has been hospitalized twice for  atypical chest pain.  It was felt to be reproducible in the chest wall.  Work-up has been negative with negative troponins.  He was thought to have exacerbation of COPD.  He also underwent GI evaluation.  His last echo showed EF around 30 to 35% last summer.  He has been on Entresto and recently had a decrease in his diuretics from torsemide 20 mg twice daily to once daily.  Was also noted to have fecal occult positive test but have subsequently returned negative.  His main concern is ongoing chest pain.  He did have heart  catheterization in 2021 which showed no coronary disease.  Past Medical History:  Diagnosis Date   Asthma    Chronic renal insufficiency    COPD (chronic obstructive pulmonary disease) (HCC)    Heart failure (HCC)    Acute on chronic combined systolic and diastolic heart failure   Hypertension    Pulmonary emboli ([facility]) 03/2019   Renal insufficiency    Type 2 diabetes mellitus ([place])     Past Surgical History:  Procedure Laterality Date   APPENDECTOMY     BALLOON DILATION N/A 03/18/2020   Procedure: BALLOON DILATION;  Surgeon: Eloise Harman, DO;  [facility];  Service: Endoscopy;  Laterality: N/A;   CHOLECYSTECTOMY     COLONOSCOPY  10/2006   Dr.Anwar:normal   ESOPHAGEAL BRUSHING  08/24/2021   Procedure: ESOPHAGEAL BRUSHING;  Surgeon: Eloise Harman, DO;  [facility];  Service: Endoscopy;;   ESOPHAGOGASTRODUODENOSCOPY (EGD) WITH PROPOFOL N/A 03/18/2020   Procedure: ESOPHAGOGASTRODUODENOSCOPY (EGD) WITH PROPOFOL;  Surgeon: Eloise Harman, DO;  [facility];  Service: Endoscopy;  Laterality: N/A;  2:30pm   ESOPHAGOGASTRODUODENOSCOPY (EGD) WITH PROPOFOL N/A 08/24/2021   Procedure: ESOPHAGOGASTRODUODENOSCOPY (EGD) WITH PROPOFOL;  Surgeon: Eloise Harman, DO;  [facility];  Service: Endoscopy;  Laterality: N/A;  with dilation   FLEXIBLE SIGMOIDOSCOPY N/A 08/24/2021   Procedure: FLEXIBLE SIGMOIDOSCOPY;  Surgeon: Eloise Harman, DO;  [facility];  Service: Endoscopy;  Laterality: N/A;   NASAL SINUS SURGERY     NEPHRECTOMY     RIGHT (Question CA.  No further therapy needed.)   RIGHT/LEFT HEART CATH AND CORONARY ANGIOGRAPHY N/A 09/03/2019   Procedure: RIGHT/LEFT HEART CATH AND CORONARY ANGIOGRAPHY;  Surgeon: Lorretta Harp, MD;  Location: [place] CV LAB;  Service: Cardiovascular;  Laterality: N/A;    Current Outpatient Medications  Medication Sig Dispense Refill   albuterol (PROVENTIL) (5 MG/ML) 0.5% nebulizer  solution Inhale 2.5 mg into the lungs every 4 (four) hours as needed for shortness of breath or wheezing.     albuterol (VENTOLIN HFA) 108 (90 Base) MCG/ACT inhaler Inhale 2 puffs into the lungs every 6 (six) hours as needed for wheezing or shortness of breath.      dapagliflozin propanediol (FARXIGA) 10 MG TABS tablet Take 1 tablet (10 mg total) by mouth daily before breakfast. 30 tablet 11   diclofenac Sodium (VOLTAREN) 1 % GEL Apply 1 application topically 4 (four) times daily as needed (pain).     esomeprazole (NEXIUM) 40 MG capsule Take 1 capsule (40 mg total) by mouth daily. 90 capsule 3   ferrous sulfate 325 (65 FE) MG tablet Take 1 tablet (325 mg total) by mouth daily. 30 tablet 3   HM SENNA-S 8.6-50 MG tablet Take 2 tablets by mouth daily.     HYDROcodone-acetaminophen (NORCO) 7.5-325 MG tablet Take 1 tablet by mouth 2 (two) times daily as  needed for moderate pain.     ipratropium-albuterol (DUONEB) 0.5-2.5 (3) MG/3ML SOLN Take 3 mLs by nebulization every 6 (six) hours as needed (for shortness of breath/wheezing).      mirtazapine (REMERON) 15 MG tablet Take 15 mg by mouth at bedtime.     OXYGEN Inhale 4 L into the lungs at bedtime as needed (for shortness of breath).     polyethylene glycol (MIRALAX / GLYCOLAX) 17 g packet Take 17 g by mouth daily. 30 each 1   sacubitril-valsartan (ENTRESTO) 49-51 MG Take 1 tablet by mouth 2 (two) times daily. 60 tablet 6   tamsulosin (FLOMAX) 0.4 MG CAPS capsule Take 0.8 mg by mouth 2 (two) times daily.     torsemide (DEMADEX) 20 MG tablet Take 1 tablet (20 mg total) by mouth daily. 60 tablet 6   TRELEGY ELLIPTA 200-62.5-25 MCG/ACT AEPB Take 1 puff by mouth daily.     No current facility-administered medications for this visit.   Allergies:  Amoxicillin, Amoxicillin-pot clavulanate, Ciprofloxacin, and Sulfa antibiotics   Social History: The patient  reports that he quit smoking about 45 years ago. His smoking use included cigarettes. He started  smoking about 64 years ago. He has a 15.00 pack-year smoking history. He quit smokeless tobacco use about 15 years ago.  His smokeless tobacco use included chew. He reports that he does not currently use alcohol. He reports that he does not use drugs.   Family History: The patient's family history includes Breast cancer in his mother; Lung cancer in his brother; Stroke in his mother.   ROS:  Please see the history of present illness. Otherwise, complete review of systems is positive for none.  All other systems are reviewed and negative.   Physical Exam: VS:  BP 130/62    Pulse 81    Ht 5\' 6"  (1.676 m)    Wt 143 lb 9.6 oz (65.1 kg)    SpO2 96%    BMI 23.18 kg/m , BMI Body mass index is 23.18 kg/m.  Wt Readings from Last 3 Encounters:  10/05/21 143 lb 9.6 oz (65.1 kg)  09/22/21 125 lb 6.4 oz (56.9 kg)  08/22/21 151 lb 14.4 oz (68.9 kg)    General appearance: alert, appears older than stated age, and no distress Neck: no carotid bruit, no JVD, and thyroid not enlarged, symmetric, no tenderness/mass/nodules Lungs: rhonchi bilaterally and wheezes bilaterally Heart: regular rate and rhythm Abdomen: soft, non-tender; bowel sounds normal; no masses,  no organomegaly and scaphoid Extremities: edema 1+ bilateral sock line Pulses: 2+ and symmetric Skin: Skin color, texture, turgor normal. No rashes or lesions Neurologic: Grossly normal Psych: Pleasant   ECG:   Deferred  Recent Labwork: 08/22/2021: B Natriuretic Peptide 639.0 09/22/2021: ALT 6; AST 12; Magnesium 1.9 09/23/2021: BUN 29; Creatinine, Ser 1.38; Hemoglobin 7.7; Platelets 383; Potassium 5.1; Sodium 138  No results found for: CHOL, TRIG, HDL, CHOLHDL, VLDL, LDLCALC, LDLDIRECT Assessment and Plan:  1. NICM (nonischemic cardiomyopathy) ([place])     1. Acute on chronic systolic heart failure (HCC) He was recently switched to torsemide from Lasix by Dr. Harl Bowie.  However, lasix was decreased by his PCP for renal function. He has had  some feet and ankle edema. He states he is compliant with his medications.  Continue torsemide 20 mg p.o daily.  Continue carvedilol 6.25 mg p.o. twice daily.  Continue Farxiga 10 mg daily.  Will increase Entresto to 49/51 mg twice daily.  Also get an updated echocardiogram.  3. Severe  persistent asthma/COPD/bronchiectasis unspecified whether complicated As noted above he has had a previous diagnosis of severe persistent asthma/bronchiectasis, fixed airway obstruction, per [facility] notes back in 2006.  He saw Dr. Dorris Singh pulmonologist on 03/02/2021 at [facility].  He collected a sputum sample for Mycobacterium, fungus, culture for Gram stain, IgE zone 2 respiratory profile.  Culture showed 2+ mixed gram-negative rods, yeast (not cryptococcus), exophiala.  He was started on Trelegy.  Recent PFTs indicated moderate obstructive disease and air trapping per provider note at [facility].  A CT scan of abdomen pelvis on 12/26/2020 Demonstrated interval worsening of bibasilar findings likely representing infection/inflammation.  Pulmonary nodules measuring up to 7 mm.  Noncontrast CT at 3 to 6 months was recommended. He states he is seeing a lung specialist at [facility] on 05/04/2021. I think he may be referring to [place] specialist he just saw on March 02, 2021  3.  Chest pain This is an ongoing and recurrent issue.  He has been noted to have negative troponins in the hospital.  This chest wall pain is somewhat reproducible.  He has been given Voltaren prescription but has not used it consistently.  I was able to reproduce that today.  There may be pleuritic/neuropathic pain component.  I have encouraged him to retrial that.   Disposition: Follow-up with Dr. Harl Bowie or APP 3 months  Pixie Casino, MD, [facility], [place] Director of the Advanced Lipid Disorders &  Cardiovascular Risk Reduction Clinic Diplomate of the American Board of Clinical Lipidology Attending Cardiologist  Direct Dial:  [phone]   Fax: [phone]  Website:  [email or web address]

## 2021-10-13 ENCOUNTER — Other Ambulatory Visit: Payer: Self-pay

## 2021-10-13 ENCOUNTER — Ambulatory Visit (HOSPITAL_COMMUNITY)
Admission: RE | Admit: 2021-10-13 | Discharge: 2021-10-13 | Disposition: A | Discharge status: Home / Self Care | Payer: Medicare HMO | Source: Ambulatory Visit | Attending: Internal Medicine | Admitting: Internal Medicine

## 2021-10-13 DIAGNOSIS — I428 Other cardiomyopathies: Secondary | ICD-10-CM | POA: Insufficient documentation

## 2021-10-13 LAB — ECHOCARDIOGRAM COMPLETE
Area-P 1/2: 4.03 cm2
Calc EF: 50.4 %
S' Lateral: 3.8 cm
Single Plane A2C EF: 45.1 %
Single Plane A4C EF: 53.5 %

## 2021-10-13 NOTE — Progress Notes (Signed)
*  PRELIMINARY RESULTS* ?Echocardiogram ?2D Echocardiogram has been performed. ? ?Gregory Mitchell ?10/13/2021, 2:59 PM ?

## 2021-10-20 ENCOUNTER — Telehealth: Payer: Self-pay

## 2021-10-20 NOTE — Telephone Encounter (Signed)
Pt's daughter called and wants to reschedule her fathers appt with Dr. Abbey Chatters that they had car trouble. She wants a appt as soon as possible to follow-up ?

## 2021-11-02 ENCOUNTER — Other Ambulatory Visit (HOSPITAL_COMMUNITY): Payer: Self-pay | Admitting: Adult Health

## 2021-11-23 ENCOUNTER — Encounter: Payer: Self-pay | Admitting: Gastroenterology

## 2021-11-23 ENCOUNTER — Ambulatory Visit (INDEPENDENT_AMBULATORY_CARE_PROVIDER_SITE_OTHER): Payer: Medicare HMO | Admitting: Gastroenterology

## 2021-11-23 VITALS — BP 142/82 | HR 87 | Temp 97.8°F | Ht 66.0 in | Wt 149.0 lb

## 2021-11-23 DIAGNOSIS — R1319 Other dysphagia: Secondary | ICD-10-CM | POA: Diagnosis not present

## 2021-11-23 DIAGNOSIS — D509 Iron deficiency anemia, unspecified: Secondary | ICD-10-CM | POA: Diagnosis not present

## 2021-11-23 DIAGNOSIS — K5900 Constipation, unspecified: Secondary | ICD-10-CM

## 2021-11-23 MED ORDER — LACTULOSE 10 GM/15ML PO SOLN: 20.0000 g | Freq: Three times a day (TID) | ORAL | 3 refills | Status: DC | PRN

## 2021-11-23 NOTE — Patient Instructions (Addendum)
For constipation: Start lactulose 30 cc up to 3 times daily as needed to maintain regular soft stools.  You can discontinue Senna if you find effective.  Prescription sent to your pharmacy. ?We will obtain copy of recent labs for review.  Further recommendations to follow. ?

## 2021-11-23 NOTE — Progress Notes (Signed)
? ? ? ?GI Office Note   ? ?Referring Provider: Neale Burly, MD ?Primary Care Physician:  Neale Burly, MD  ?Primary Gastroenterologist: Elon Alas. Abbey Chatters, DO ? ? ?Chief Complaint  ? ?Chief Complaint  ?Patient presents with  ? Dysphagia  ?  Trouble swallowing  ? Abdominal Pain  ?  Patient is also having abdominal pain. Pt stays constipated. Pt takes milk of magnesia and senokot.   ? ? ?History of Present Illness  ? ?Gregory Mitchell is a 81 y.o. male presenting today for hospital follow-up.  Patient seen in the hospital back in February for anemia.  History of combined systolic and diastolic heart failure, hypertension, PE on Eliquis (has been stopped), diabetes, chronic kidney disease.  Admitted with COPD exacerbation and concern for pneumonia.  Presented with hemoglobin of 7.7 at that time.  He has a history of IDA.  He was seen during January hospitalization for acute blood loss anemia/symptomatic anemia, melena as well.  EGD at that time showed severe candidiasis which was treated with Diflucan.  He has had recurrent Candida esophagitis, treated 4 times with Diflucan.  HIV testing negative.  Completed flexible sigmoidoscopy for history of rectal wall thickening seen on CT in May 2022. ? ?Labs from July 2021: Serum iron 20, iron saturation 6%, TIBC 325, ferritin 32. In 06/2021, Iron 9, TIBC 118, iron saturation 7%. ? ?Labs from February 2023: Hemoglobin 7.7, MCV 104.3.  B12 274, folate 7.6.  At discharge on August 25, 2021, hemoglobin 7.6.  In 2022 hemoglobin ranged around 10-11. ?  ?Patient has been having ongoing issues with constipation and dysphagia. States his swallowing improved after taking Diflucan the last time in 08/2021 but now having problems again. Food, especially meats, get stuck in the esophagus. Uses plenty of liquid to get it down. Large pills get stuck at times, washes down with extra liquid. No odynophagia. No heartburn.  Since his last visit here he has had 3 more rounds of antibiotics  (moxifloxacin, nitrofurantoin, azithromycin) as well as 2 rounds of prednisone all for pneumonia.  Continues to have a hard time with constipation.  Nothing seems to help.  Took MiraLAX regularly with no improvement.  He takes sennosides 8.6 mg tablet twice daily is still only having a bowel movement once every 5 to 7 days. One time given RX for constipation but could not afford, was $600. Takes MOM sometimes which helps some. With constipation, feels bloated, both sides of abd hurt. No brbpr. Stools dark on iron.  ? ?Was seen recently at [place], [place]. Labs updated. He does not have details. Daughter is curious if he needs repeat upper endoscopy and if colon needs further evaluation.  ? ?Last EGD:08/24/21- Moderately severe candidiasis esophagitis with no bleeding. Cells for cytology obtained. ?- Gastritis. Biopsied. ?- Normal duodenal bulb, first portion of the duodenum and second portion of the ?duodenum. ?  ?Flex sig: 08/24/21- Sacral decubitus ulcer found on perianal exam. ?- The recto-sigmoid colon, sigmoid colon and descending colon are normal. ?- No specimens collected. ?  ?Colonoscopy March 2008: normal,  no specimens ? ?Ignacia Felling, CCC-SLP     07/06/2021  3:36 PM  ?[place]  ?Speech Pathology  Modified Barium Swallow  ? ?Patient Name: Gregory Mitchell Age: 81 y.o. Length of Stay: 2  ?Location: [facility] [id number]  ?Date of Birth: [birth year]  Sex: male MRN: [id number]  Admit  ?Date/Time:  07/04/2021 10:44 AM  ? ?Impression / Assessment:  ? ?  The pt presented with a mild oropharyngeal dysphagia, c/b reduced  ?laryngeal vestibule closure and reduced lingual control.  ?Pre-swallow loss to the pyriforms and penetration prior to the  ?swallow was seen with thin liquids by straw, as well as  ?penetration during the swallow with a large cup sip. Penetration  ?was transient.    ? ?Orally, the pt demonstrated increased mastication time with soft  ?solid  (crumbled Phillip Heal cracker mixed with puree) secondary to  ?missing dentition. The pt's lower dentures are loose and he  ?reported sore gums which prohibit him from wearing them as well.  ?Oral residue of solid was cleared after subsequent swallows.  ? ?Recommend continuation of a mechanical soft/7 easy to chew diet  ?with thin liquids. No further Speech Therapy is recommended.  ? ?Prognosis: Good  ? ?Recommendations:  ?Nutritional Recommendation: Mechanical Soft/7 Easy to Chew  ?Diet;Thin Liquids/0  ?Recommended Form of Meds: As tolerated  ?Compensatory Swallowing Strategies: Alternate foods and  ?liquids;Eat/feed slowly;Small, single bites/sips;Upright as  ?possible for oral intake  ? ?Results and recommendations discussed with patient.  ? ?Details of Evaluation:  ?Charting Type: Initial Evaluation  ?Referring Provider: Dr. Beckey Downing  ?Treatment Diagnosis: Dysphagia  ? ?Gregory Mitchell is a 81 y.o. male presenting with respiratory  ?failure, weight loss.  ? ?Subjective:  ?Pt reports he does not have his dentures in radiology, his gums  ?are still sore.  ? ?Current Problem List:  ? ?Patient Active Problem List  ?Diagnosis  ? NSTEMI (non-ST elevated myocardial infarction) (*)  ? Type 2 diabetes mellitus without complications (*)  ? Chronic combined systolic and diastolic CHF (congestive heart  ?failure) (*)  ? COPD with acute exacerbation (*)  ? AKI (acute kidney injury) (*)  ? Anemia  ? Bronchiectasis (*)  ? Lactic acidosis  ? Metabolic acidosis  ? Acute on chronic respiratory failure with hypoxia (*)  ? Gram-negative pneumonia (*)  ? ? ?History:  ? ?Past Medical History:  ?Diagnosis Date  ? Chronic kidney disease    ? COPD (chronic obstructive pulmonary disease) (*)    ? Pneumonia    ? VTE (venous thromboembolism)    ? ?No past surgical history on file.  ? ? ?Objective  ?Pain: 0/10 FACES  ?Location: n/a  ? ?Pre-Assessment  ?Rehab diagnosis/history: acute on chronic respiratory failure  ?with hypoxia, influenza A,  PNA. Pt seen for bedside, MBSS to r/o  ?aspiration.  ?O2 Device: Nasal cannula (4L)  ? ?WBC:  ?WBC  ?Date Value Ref Range Status  ?07/06/2021 7.6 5.1 - 10.8 thou/mcL Final  ? ?CXR in past 24 hours: No results found.  ?Temp Max in past 24 hours: Temp (24hrs), Avg:97.7 ?F (36.5 ?C),  ?Min:97.3 ?F (36.3 ?C), Max:98.2 ?F (36.8 ?C)  ? ? ?Penetration/Aspiration Scale  ?Thin/0 Administered Via: Cup;Straw  ?Penetration / Aspiration Scale: 2-Enters airway above folds /  ?ejected (PAS 2 with ~2 oz large drink by cup, sequential straw  ?sips resulted in PAS 2)  ?Penetration/Aspiration Occurred with: Cup and Straw  ?Oral phase: Impaired, pre-swallow loss to pyriforms with  ?penetration prior to swallow  ?Pharyngeal phase: Impaired; reduced laryngeal vestibule closure  ? ? ?Puree/4 Administered Via: Spoon  ?Penetration / Aspiration Scale: 1-Does not enter airway  ?Oral phase: WFL  ?Pharyngeal Phase: WFL  ? ?Solid Administered Via:  (mixed consistency of cracker and  ?pudding)  ?Penetration / Aspiration Scale: 1-Does not enter airway  ?Oral phase: Impaired with increased mastication time  ?Pharyngeal Phase: WFL  ?Deferred  crunchy solid d/t sore gums and no dysphagia had been  ?appreciated on this study  ? ?Ignacia Felling, CCC-SLP  ?07/06/2021 3:33 PM ? ?Medications  ? ?Current Outpatient Medications  ?Medication Sig Dispense Refill  ? albuterol (PROVENTIL) (5 MG/ML) 0.5% nebulizer solution Inhale 2.5 mg into the lungs every 4 (four) hours as needed for shortness of breath or wheezing.    ? albuterol (VENTOLIN HFA) 108 (90 Base) MCG/ACT inhaler Inhale 2 puffs into the lungs every 6 (six) hours as needed for wheezing or shortness of breath.     ? diclofenac Sodium (VOLTAREN) 1 % GEL Apply 1 application topically 4 (four) times daily as needed (pain).    ? FARXIGA 10 MG TABS tablet TAKE 1 TABLET BY MOUTH DAILY BEFORE BREAKFAST 30 tablet 11  ? ferrous sulfate 325 (65 FE) MG tablet Take 1 tablet (325 mg total) by mouth daily. 30  tablet 3  ? HM SENNA-S 8.'6MG'$  tablet Take 2 tablets by mouth daily.    ? HYDROcodone-acetaminophen (NORCO) 7.5-325 MG tablet Take 1 tablet by mouth 2 (two) times daily as needed for moderate pain.    ? ipratropium-a

## 2021-11-24 IMAGING — DX DG CHEST 1V PORT
2 series · 2 of 2 positions shown · non-contrast
Comparison: 10/21/2016.

CLINICAL DATA: Shortness of breath.  Left chest pressure.

EXAM:
PORTABLE CHEST 1 VIEW

[chest ap (1 of 2)]
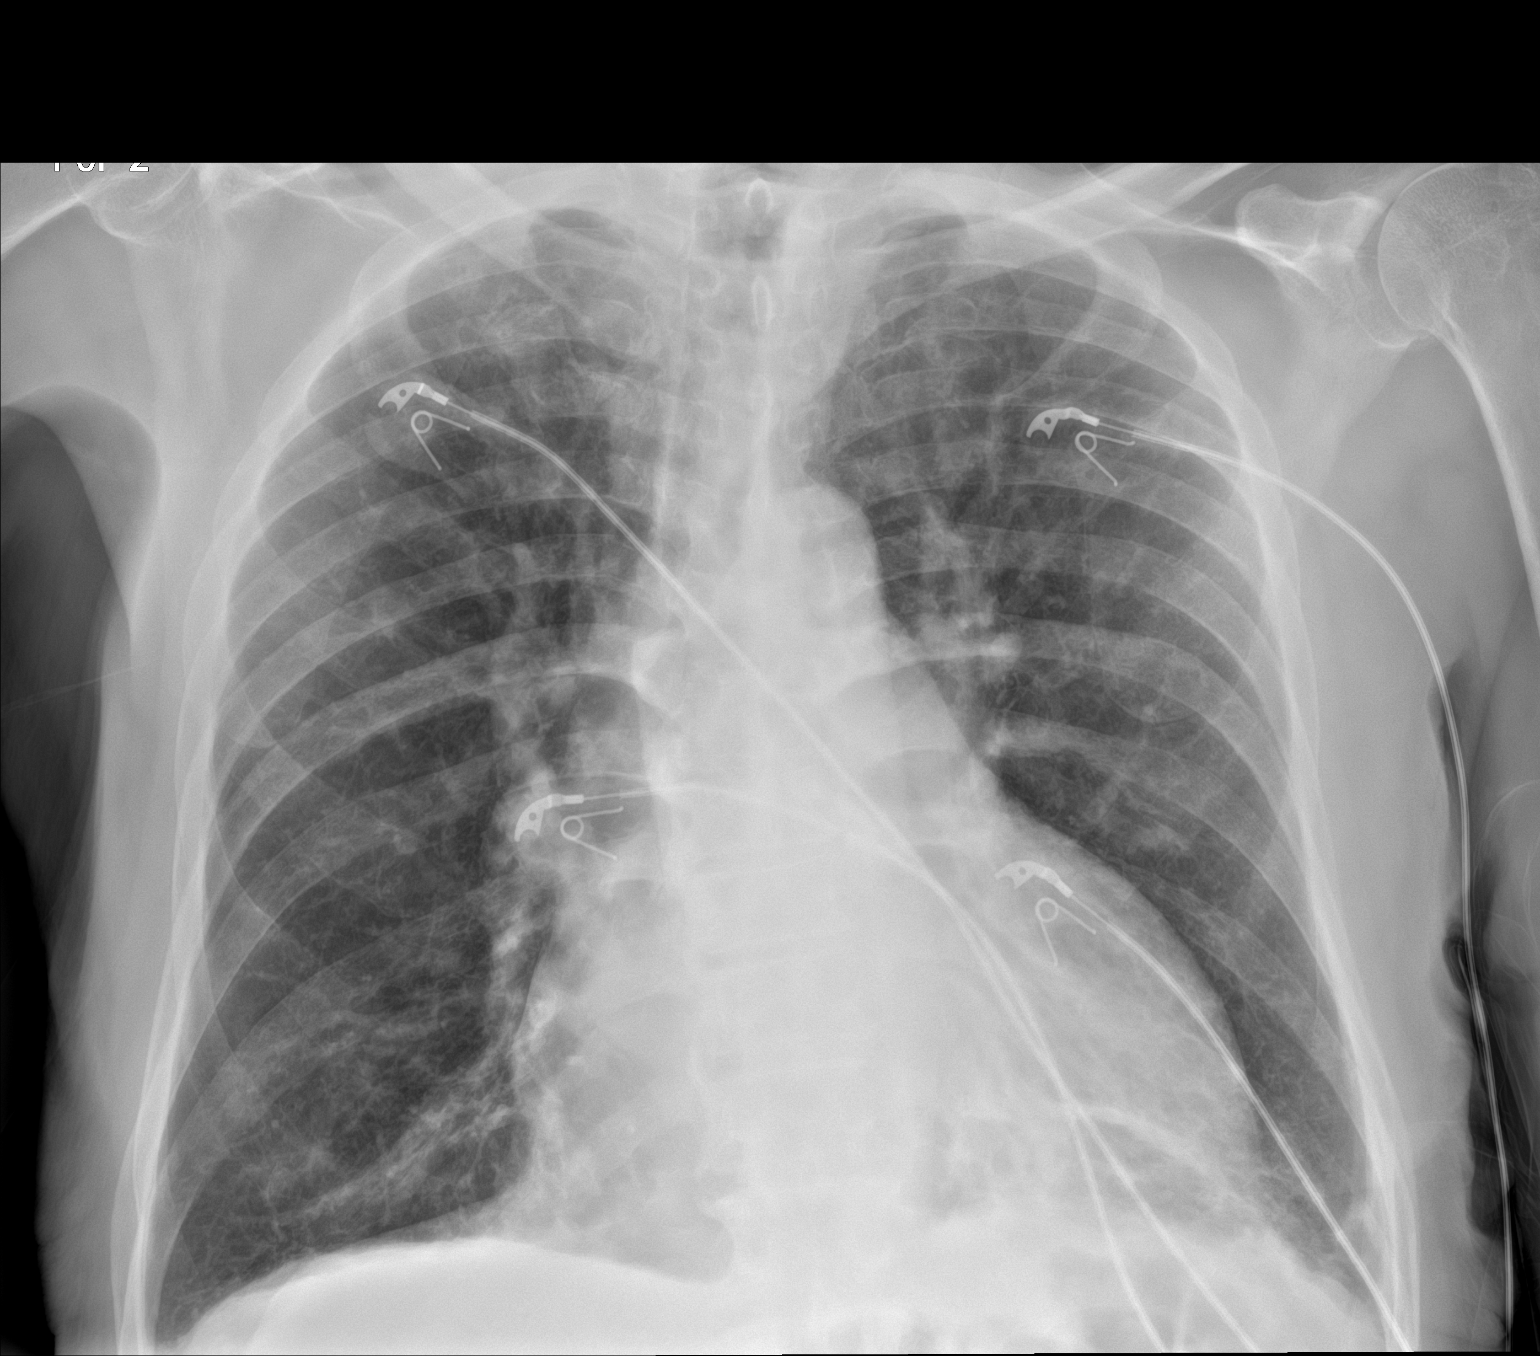

[chest ap (2 of 2)]
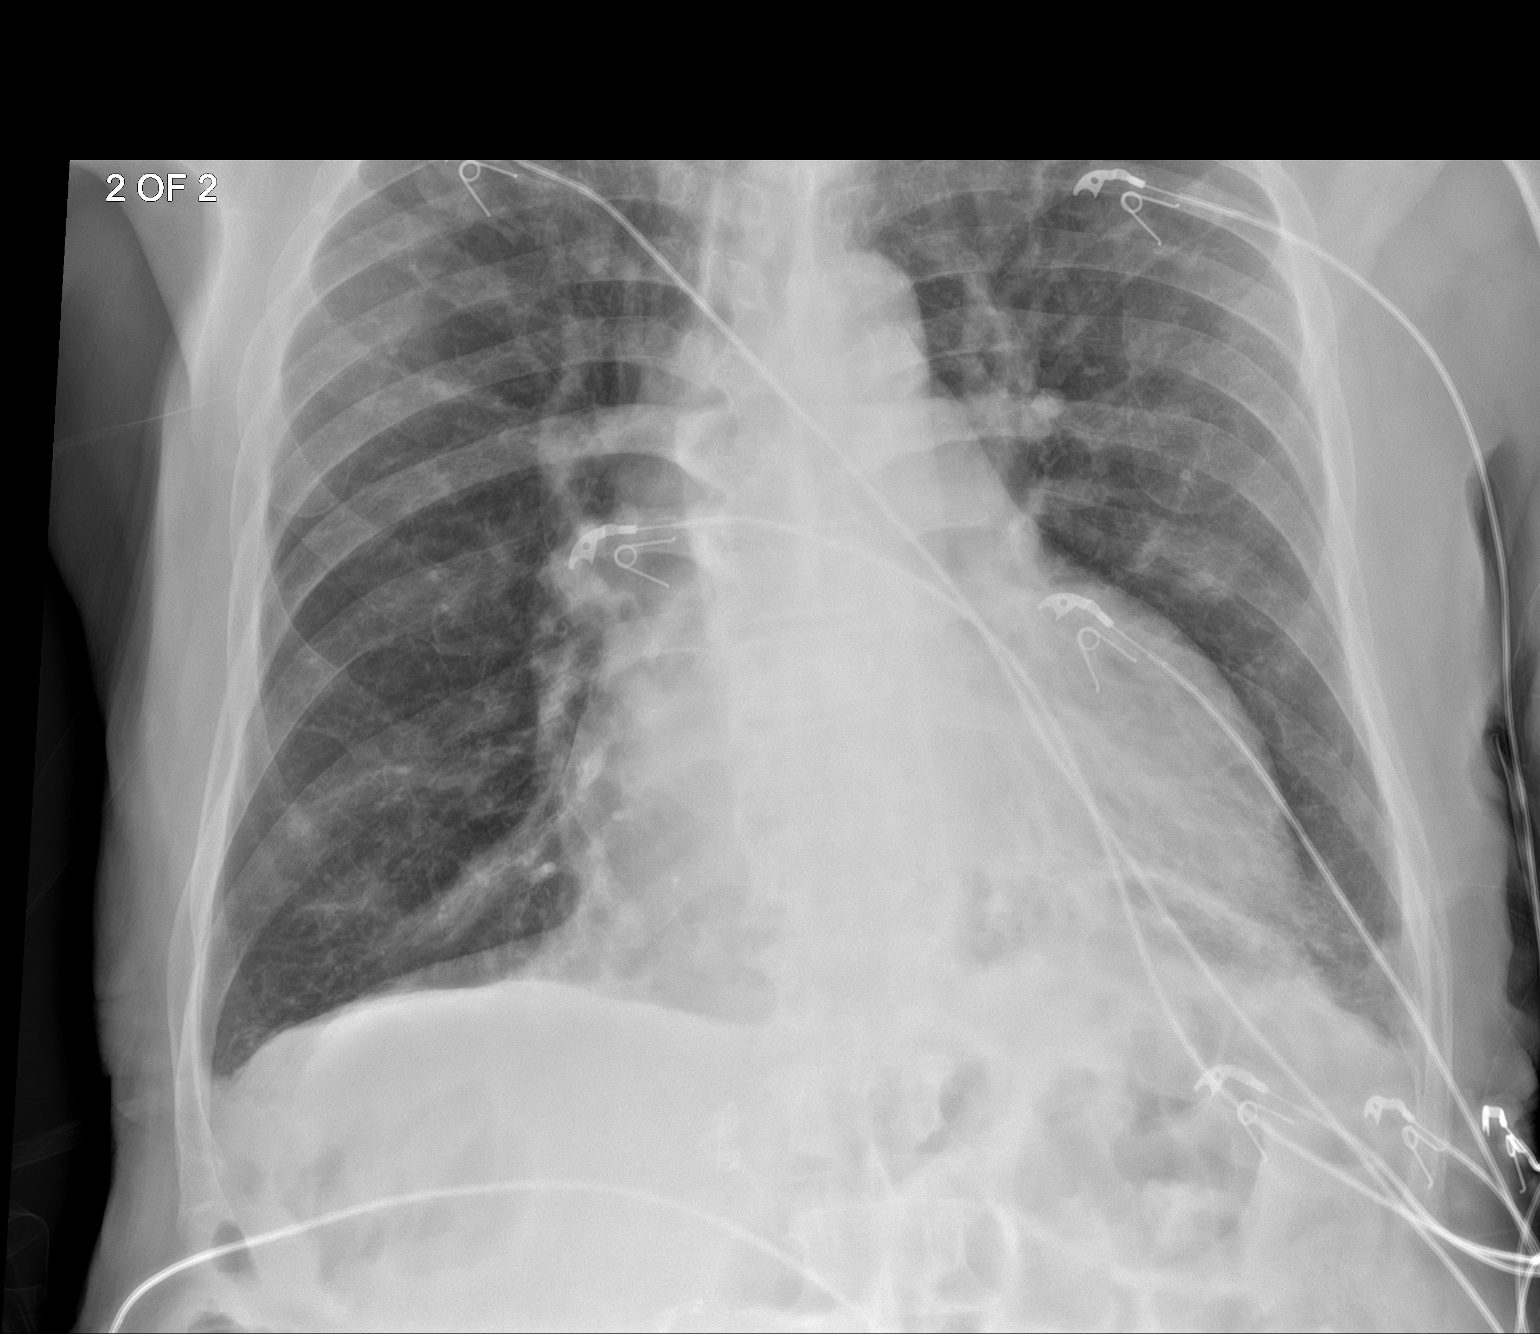

[2 of 2 positions shown; findings below may reference images not displayed]

FINDINGS: Cardiomegaly mild pulmonary venous congestion. Diffuse bilateral
interstitial prominence. Interstitial edema and/or pneumonitis could
present this fashion. Tiny bilateral pleural effusions. No
pneumothorax.
IMPRESSION: 1.  Cardiomegaly with pulmonary venous congestion.

2. Diffuse bilateral interstitial prominence interstitial edema
and/or pneumonitis could present this fashion. Tiny bilateral
pleural effusions.

## 2021-12-07 ENCOUNTER — Telehealth: Payer: Self-pay | Admitting: *Deleted

## 2021-12-07 NOTE — Telephone Encounter (Signed)
Daughter called checking to see if Gregory Mitchell spoke with Dr. Abbey Chatters regarding lab work. Patient condition getting worse per daughter. ?

## 2021-12-08 ENCOUNTER — Other Ambulatory Visit: Payer: Self-pay

## 2021-12-08 DIAGNOSIS — D638 Anemia in other chronic diseases classified elsewhere: Secondary | ICD-10-CM

## 2021-12-08 NOTE — Telephone Encounter (Addendum)
Will call to schedule TCS/EGD/DIL when Dr. Ave Filter next schedule is available. ?

## 2021-12-08 NOTE — Telephone Encounter (Signed)
Please let daughter know that I have never received requested labs from PCP but I did talk with Dr. Abbey Chatters. He recommends EGD for recurrent dysphagia/candida esophagitis and TCS for IDA. Patient can have both EGD/ED/TCS, if refuses TCS then plan for EGD/ED only. ? ?Put in comments, send specimen for fungal culture and sensitivities for recurrent candida esophagitis. ASA 4.  ? ?Hold iron 7 days if TCS. ?Day of before procedure (s): farxiga 1/2 tablet am ?AM of procedures: hold farxiga ?2 days clear liquids ?Dulcolax '10mg'$  daily for 3 days before prep for TCS. ?Trilyte split the day before.  ?Tap water enemas not fleets ? ?ALSO NEED HIM TO HAVE CBC, IRON/TIBC/FERRITIN NOW ?

## 2021-12-08 NOTE — Telephone Encounter (Signed)
Spoke with patient's daughter, labs ordered for patient to have done asap and is also ready to schedule egd/colonoscopy.  ?

## 2021-12-09 NOTE — Telephone Encounter (Signed)
Pt's daughter LMOVM asking why her father has to have another procedure so soon. Please call to clarify. [phone] ?

## 2021-12-09 NOTE — Telephone Encounter (Signed)
Spoke with daughter and told her that it was due to his recurrent dysphagia/candida esophagitis and for his IDA as well. Pt thought that he had one done back in January but according to the patient's chart he had an egd and flex sig done. Pt's daughter verbalized understanding and will wait for procedures to be scheduled.  ?

## 2021-12-16 ENCOUNTER — Encounter: Payer: Self-pay | Admitting: *Deleted

## 2021-12-16 MED ORDER — PEG 3350-KCL-NA BICARB-NACL 420 G PO SOLR: ORAL | 0 refills | Status: DC

## 2021-12-16 NOTE — Addendum Note (Signed)
Addended by: Cheron Every on: 12/16/2021 10:49 AM ? ? Modules accepted: Orders ? ?

## 2021-12-16 NOTE — Telephone Encounter (Signed)
Spoke with daughter. Patient scheduled for 6/2 at 12:30pm. Aware will mail prep instructions/pre-op appt. Discussed meds that need to be held and special instructions.  ?

## 2021-12-17 NOTE — Progress Notes (Signed)
? ?Cardiology Office Note   ? ?Date:  12/18/2021  ? ?[name], DOB 1940-10-13, MRN [id number] ? ?PCP:  Beola Cord, FNP  ?Cardiologist: Carlyle Dolly, MD   ? ?Chief Complaint  ?Patient presents with  ? Follow-up  ?  3 month visit  ? ? ?History of Present Illness:   ? ?Gregory Mitchell is a 81 y.o. male with past medical history of HFrEF (EF 25-30% in 08/2019 with cath showing normal cors, EF 20-25% in 09/2020 and 30-35% in 02/2021), HTN, HLD, Type 2 DM and COPD who presents to the office today for 53-monthfollow-up. ? ?He was last examined by Dr. HDebara Pickettin 09/2021 for hospital follow-up for recent episodes of chest pain which were overall felt to be atypical for a cardiac etiology as his pain was reproducible with palpation. Troponin values had been negative and his EKG tracings showed no acute ischemic changes. In regards to his cardiomyopathy, he was continued on Torsemide 20 mg daily, Coreg 6.25 mg twice daily and Farxiga 10 mg daily with Entresto being titrated to 49-51 mg twice daily. Was also recommended he have an updated echocardiogram. This was performed in 10/2021 and showed his EF was similar at 20 to 25% with severe concentric LVH. He did have Grade 2 DD, normal RV function and mildly elevated PASP. Also had mild to moderate TR but no significant valve abnormalities. ? ?In talking with the patient and his daughter today, he reports he has baseline dyspnea on exertion and feels like this has progressed. He has difficulty traveling to see his Pulmonologist and requests to follow-up in [place] He denies any specific orthopnea, PND or pitting edema. Only takes Torsemide as needed. In reviewing his medication list, prior visits mentioned he was on Coreg 6.'25mg'$  BID but he has actually been off of this since last year as he had hypotension during an admission and it was never restarted.  ? ?Past Medical History:  ?Diagnosis Date  ? Asthma   ? Chronic renal insufficiency   ? COPD (chronic  obstructive pulmonary disease) ([facility]   ? Heart failure ([facility]   ? a. EF 25-30% in 08/2019 with cath showing normal cors b. EF 20-25% in 09/2020 c. 30-35% in 02/2021 d. EF 20-25% in 10/2021  ? Hypertension   ? Pulmonary emboli ([facility] 03/2019  ? Renal insufficiency   ? Type 2 diabetes mellitus ([facility]   ? ? ?Past Surgical History:  ?Procedure Laterality Date  ? APPENDECTOMY    ? BALLOON DILATION N/A 03/18/2020  ? Procedure: BALLOON DILATION;  Surgeon: CEloise Harman DO;  [facility];  Service: Endoscopy;  Laterality: N/A;  ? CHOLECYSTECTOMY    ? COLONOSCOPY  10/2006  ? Dr.Anwar:normal  ? ESOPHAGEAL BRUSHING  08/24/2021  ? Procedure: ESOPHAGEAL BRUSHING;  Surgeon: CEloise Harman DO;  [facility];  Service: Endoscopy;;  ? ESOPHAGOGASTRODUODENOSCOPY (EGD) WITH PROPOFOL N/A 03/18/2020  ? Procedure: ESOPHAGOGASTRODUODENOSCOPY (EGD) WITH PROPOFOL;  Surgeon: CEloise Harman DO;  [facility];  Service: Endoscopy;  Laterality: N/A;  2:30pm  ? ESOPHAGOGASTRODUODENOSCOPY (EGD) WITH PROPOFOL N/A 08/24/2021  ? Procedure: ESOPHAGOGASTRODUODENOSCOPY (EGD) WITH PROPOFOL;  Surgeon: CEloise Harman DO;  [facility];  Service: Endoscopy;  Laterality: N/A;  with dilation  ? FLEXIBLE SIGMOIDOSCOPY N/A 08/24/2021  ? Procedure: FLEXIBLE SIGMOIDOSCOPY;  Surgeon: CEloise Harman DO;  [facility];  Service: Endoscopy;  Laterality: N/A;  ? NASAL SINUS SURGERY    ?  NEPHRECTOMY    ? RIGHT (Question CA.  No further therapy needed.)  ? RIGHT/LEFT HEART CATH AND CORONARY ANGIOGRAPHY N/A 09/03/2019  ? Procedure: RIGHT/LEFT HEART CATH AND CORONARY ANGIOGRAPHY;  Surgeon: Gregory Harp, MD;  Location: [facility];  Service: Cardiovascular;  Laterality: N/A;  ? ? ?Current Medications: ?Outpatient Medications Prior to Visit  ?Medication Sig Dispense Refill  ? albuterol (PROVENTIL) (5 MG/ML) 0.5% nebulizer solution Inhale 2.5 mg into the lungs every 4 (four) hours as needed for  shortness of breath or wheezing.    ? albuterol (VENTOLIN HFA) 108 (90 Base) MCG/ACT inhaler Inhale 2 puffs into the lungs every 6 (six) hours as needed for wheezing or shortness of breath.     ? diclofenac Sodium (VOLTAREN) 1 % GEL Apply 1 application topically 4 (four) times daily as needed (pain).    ? FARXIGA 10 MG TABS tablet TAKE 1 TABLET BY MOUTH DAILY BEFORE BREAKFAST 30 tablet 11  ? ferrous sulfate 325 (65 FE) MG tablet Take 1 tablet (325 mg total) by mouth daily. 30 tablet 3  ? HM SENNA-S 8.6-50 MG tablet Take 2 tablets by mouth daily.    ? HYDROcodone-acetaminophen (NORCO) 7.5-325 MG tablet Take 1 tablet by mouth 2 (two) times daily as needed for moderate pain.    ? ipratropium-albuterol (DUONEB) 0.5-2.5 (3) MG/3ML SOLN Take 3 mLs by nebulization every 6 (six) hours as needed (for shortness of breath/wheezing).     ? lactulose (CHRONULAC) 10 GM/15ML solution Take 30 mLs (20 g total) by mouth 3 (three) times daily as needed for mild constipation. 1800 mL 3  ? OXYGEN Inhale 4 L into the lungs at bedtime as needed (for shortness of breath).    ? pantoprazole (PROTONIX) 40 MG tablet Take 40 mg by mouth 2 (two) times daily.    ? polyethylene glycol-electrolytes (NULYTELY) 420 g solution As directed 4000 mL 0  ? sacubitril-valsartan (ENTRESTO) 49-51 MG Take 1 tablet by mouth 2 (two) times daily. 60 tablet 6  ? tamsulosin (FLOMAX) 0.4 MG CAPS capsule Take 0.8 mg by mouth 2 (two) times daily.    ? torsemide (DEMADEX) 20 MG tablet Take 1 tablet (20 mg total) by mouth daily. 60 tablet 6  ? TRELEGY ELLIPTA 200-62.5-25 MCG/ACT AEPB Take 1 puff by mouth daily.    ? ?No facility-administered medications prior to visit.  ?  ? ?Allergies:   Amoxicillin, Amoxicillin-pot clavulanate, Ciprofloxacin, and Sulfa antibiotics  ? ?Social History  ? ?Socioeconomic History  ? Marital status: Widowed  ?  Spouse name: Not on file  ? Number of children: Not on file  ? Years of education: Not on file  ? Highest education level: Not on  file  ?Occupational History  ? Not on file  ?Tobacco Use  ? Smoking status: Former  ?  Packs/day: 1.00  ?  Years: 15.00  ?  Pack years: 15.00  ?  Types: Cigarettes  ?  Start date: 07/14/1957  ?  Quit date: 08/09/1976  ?  Years since quitting: 45.3  ? Smokeless tobacco: Former  ?  Types: Chew  ?  Quit date: 02/10/2006  ? Tobacco comments:  ?  chewed tobacco for 10 years  ?Vaping Use  ? Vaping Use: Never used  ?Substance and Sexual Activity  ? Alcohol use: Not Currently  ?  Alcohol/week: 0.0 standard drinks  ?  Comment: no etoh since he was in his 60s  ? Drug use: Never  ? Sexual activity: Yes  ?  Other Topics Concern  ? Not on file  ?Social History Narrative  ? Lives with wife.  Three children.    ? ?Social Determinants of Health  ? ?Financial Resource Strain: Not on file  ?Food Insecurity: Not on file  ?Transportation Needs: Not on file  ?Physical Activity: Not on file  ?Stress: Not on file  ?Social Connections: Not on file  ?  ? ?Family History:  The patient's family history includes Breast cancer in his mother; Lung cancer in his brother; Stroke in his mother.  ? ?Review of Systems:   ? ?Please see the history of present illness.    ? ?All other systems reviewed and are otherwise negative except as noted above. ? ? ?Physical Exam:   ? ?VS:  BP (!) 126/58   Pulse 72   Ht '5\' 6"'$  (1.676 m)   Wt 149 lb (67.6 kg)   SpO2 95%   BMI 24.05 kg/m?    ?General: Pleasant elderly male appearing in no acute distress. ?Head: Normocephalic, atraumatic. ?Neck: No carotid bruits. JVD not elevated.  ?Lungs: Respirations regular and unlabored, without wheezes or rales.  ?Heart: Regular rate and rhythm. No S3 or S4.  No murmur, no rubs, or gallops appreciated. ?Abdomen: Appears non-distended. No obvious abdominal masses. ?Msk:  Strength and tone appear normal for age. No obvious joint deformities or effusions. ?Extremities: No clubbing or cyanosis. No pitting edema.  Distal pedal pulses are 2+ bilaterally. ?Neuro: Alert and oriented X 3.  Moves all extremities spontaneously. No focal deficits noted. ?Psych:  Responds to questions appropriately with a normal affect. ?Skin: No rashes or lesions noted ? ?Wt Readings from Last 3 Encounters:  ?05/1

## 2021-12-18 ENCOUNTER — Ambulatory Visit: Payer: Medicare HMO | Admitting: Student

## 2021-12-18 ENCOUNTER — Encounter: Payer: Self-pay | Admitting: Student

## 2021-12-18 VITALS — BP 126/58 | HR 72 | Ht 66.0 in | Wt 149.0 lb

## 2021-12-18 DIAGNOSIS — I428 Other cardiomyopathies: Secondary | ICD-10-CM | POA: Diagnosis not present

## 2021-12-18 DIAGNOSIS — I5022 Chronic systolic (congestive) heart failure: Secondary | ICD-10-CM | POA: Diagnosis not present

## 2021-12-18 DIAGNOSIS — J449 Chronic obstructive pulmonary disease, unspecified: Secondary | ICD-10-CM

## 2021-12-18 DIAGNOSIS — I10 Essential (primary) hypertension: Secondary | ICD-10-CM

## 2021-12-18 DIAGNOSIS — E782 Mixed hyperlipidemia: Secondary | ICD-10-CM

## 2021-12-18 MED ORDER — METOPROLOL SUCCINATE ER 25 MG PO TB24
25.0000 mg | ORAL_TABLET | Freq: Every day | ORAL | 3 refills | Status: DC
Start: 2021-12-18 — End: 2022-11-18

## 2021-12-18 NOTE — Patient Instructions (Signed)
Medication Instructions:  ? ?START Toprol XL (metoprolol succinate)  25 mg daily ? ?Labwork: ?None today ? ?Testing/Procedures: ?None today ? ?Follow-Up: ?3 months Dr.Branch ? ?Any Other Special Instructions Will Be Listed Below (If Applicable). ? ? ?You have been referred to Pulmonary in [place]. They will call you to schedule an appointment. ? ? ?If you need a refill on your cardiac medications before your next appointment, please call your pharmacy. ? ?

## 2021-12-21 NOTE — Telephone Encounter (Signed)
Daughter states that the patient will be having his labs done tomorrow (12/22/21) ?

## 2021-12-21 NOTE — Telephone Encounter (Signed)
Tammy, please remind patient to have his labs done that were ordered 12/08/21.  ?

## 2021-12-30 ENCOUNTER — Telehealth: Payer: Self-pay

## 2021-12-30 ENCOUNTER — Telehealth: Payer: Self-pay | Admitting: Student

## 2021-12-30 MED ORDER — TORSEMIDE 20 MG PO TABS: 20.0000 mg | ORAL_TABLET | Freq: Every day | ORAL | 6 refills | Status: DC

## 2021-12-30 NOTE — Telephone Encounter (Signed)
 *  STAT* If patient is at the pharmacy, call can be transferred to refill team.   1. Which medications need to be refilled? (please list name of each medication and dose if known) torsemide (DEMADEX) 20 MG tablet  2. Which pharmacy/location (including street and city if local pharmacy) is medication to be sent to? [place], [place] - [address]  3. Do they need a 30 day or 90 day supply? 90 days Pt is out of meds

## 2021-12-30 NOTE — Telephone Encounter (Signed)
Completed.

## 2021-12-30 NOTE — Telephone Encounter (Signed)
Gregory Mitchell from [facility] phoned @ 4:22pm she stated in a vm stating pt was there to have labs done. States they are in the waiting room and they close at 5pm. Wanted labs faxed but didn't leave a phone number, ext or fax number to reach her.

## 2022-01-02 LAB — CBC WITH DIFFERENTIAL/PLATELET
Basophils Absolute: 0 10*3/uL (ref 0.0–0.2)
Basos: 0 %
EOS (ABSOLUTE): 0 10*3/uL (ref 0.0–0.4)
Eos: 0 %
Hematocrit: 31.2 % — ABNORMAL LOW (ref 37.5–51.0)
Hemoglobin: 9.4 g/dL — ABNORMAL LOW (ref 13.0–17.7)
Immature Grans (Abs): 0.1 10*3/uL (ref 0.0–0.1)
Immature Granulocytes: 1 %
Lymphocytes Absolute: 1.1 10*3/uL (ref 0.7–3.1)
Lymphs: 11 %
MCH: 26.8 pg (ref 26.6–33.0)
MCHC: 30.1 g/dL — ABNORMAL LOW (ref 31.5–35.7)
MCV: 89 fL (ref 79–97)
Monocytes Absolute: 0.5 10*3/uL (ref 0.1–0.9)
Monocytes: 5 %
Neutrophils Absolute: 8.3 10*3/uL — ABNORMAL HIGH (ref 1.4–7.0)
Neutrophils: 83 %
Platelets: 384 10*3/uL (ref 150–450)
RBC: 3.51 x10E6/uL — ABNORMAL LOW (ref 4.14–5.80)
RDW: 13.4 % (ref 11.6–15.4)
WBC: 9.9 10*3/uL (ref 3.4–10.8)

## 2022-01-02 LAB — IRON,TIBC AND FERRITIN PANEL
Ferritin: 99 ng/mL (ref 30–400)
Iron Saturation: 13 % — ABNORMAL LOW (ref 15–55)
Iron: 38 ug/dL (ref 38–169)
Total Iron Binding Capacity: 290 ug/dL (ref 250–450)
UIBC: 252 ug/dL (ref 111–343)

## 2022-01-05 NOTE — Patient Instructions (Signed)
Gregory Mitchell  01/05/2022     '@PREFPERIOPPHARMACY'$ @   Your procedure is scheduled on  01/08/2022.   Report to [facility] at  1030 A.M.   Call this number if you have problems the morning of surgery:  [phone]   Remember:  Follow the diet and prep instructions given to you by the office.    Use your nebulizer and your inhalers before you come and bring your rescue inhaler with you.     Take these medicines the morning of surgery with A SIP OF WATER           hydrocodone (if needed), metoprolol, protonix, entresto.     Do not wear jewelry, make-up or nail polish.  Do not wear lotions, powders, or perfumes, or deodorant.  Do not shave 48 hours prior to surgery.  Men may shave face and neck.  Do not bring valuables to the hospital.  [facility] is not responsible for any belongings or valuables.  Contacts, dentures or bridgework may not be worn into surgery.  Leave your suitcase in the car.  After surgery it may be brought to your room.  For patients admitted to the hospital, discharge time will be determined by your treatment team.  Patients discharged the day of surgery will not be allowed to drive home and must have someone with them for 24 hours.    Special instructions:   DO NOT smoke tobacco or vape for 24 hours before your procedure.  Please read over the following fact sheets that you were given. Anesthesia Post-op Instructions and Care and Recovery After Surgery      Upper Endoscopy, Adult, Care After This sheet gives you information about how to care for yourself after your procedure. Your health care provider may also give you more specific instructions. If you have problems or questions, contact your health care provider. What can I expect after the procedure? After the procedure, it is common to have: A sore throat. Mild stomach pain or discomfort. Bloating. Nausea. Follow these instructions at home:  Follow instructions from your  health care provider about what to eat or drink after your procedure. Return to your normal activities as told by your health care provider. Ask your health care provider what activities are safe for you. Take over-the-counter and prescription medicines only as told by your health care provider. If you were given a sedative during the procedure, it can affect you for several hours. Do not drive or operate machinery until your health care provider says that it is safe. Keep all follow-up visits as told by your health care provider. This is important. Contact a health care provider if you have: A sore throat that lasts longer than one day. Trouble swallowing. Get help right away if: You vomit blood or your vomit looks like coffee grounds. You have: A fever. Bloody, black, or tarry stools. A severe sore throat or you cannot swallow. Difficulty breathing. Severe pain in your chest or abdomen. Summary After the procedure, it is common to have a sore throat, mild stomach discomfort, bloating, and nausea. If you were given a sedative during the procedure, it can affect you for several hours. Do not drive or operate machinery until your health care provider says that it is safe. Follow instructions from your health care provider about what to eat or drink after your procedure. Return to your normal activities as told by your health care provider. This  information is not intended to replace advice given to you by your health care provider. Make sure you discuss any questions you have with your health care provider. Document Revised: 06/01/2019 Document Reviewed: 12/26/2017 Elsevier Patient Education  [name] Esophageal Dilatation Esophageal dilatation, also called esophageal dilation, is a procedure to widen or open a blocked or narrowed part of the esophagus. The esophagus is the part of the body that moves food and liquid from the mouth to the stomach. You may need this procedure  if: You have a buildup of scar tissue in your esophagus that makes it difficult, painful, or impossible to swallow. This can be caused by gastroesophageal reflux disease (GERD). You have cancer of the esophagus. There is a problem with how food moves through your esophagus. In some cases, you may need this procedure repeated at a later time to dilate the esophagus gradually. Tell a health care provider about: Any allergies you have. All medicines you are taking, including vitamins, herbs, eye drops, creams, and over-the-counter medicines. Any problems you or family members have had with anesthetic medicines. Any blood disorders you have. Any surgeries you have had. Any medical conditions you have. Any antibiotic medicines you are required to take before dental procedures. Whether you are pregnant or may be pregnant. What are the risks? Generally, this is a safe procedure. However, problems may occur, including: Bleeding due to a tear in the lining of the esophagus. A hole, or perforation, in the esophagus. What happens before the procedure? Ask your health care provider about: Changing or stopping your regular medicines. This is especially important if you are taking diabetes medicines or blood thinners. Taking medicines such as aspirin and ibuprofen. These medicines can thin your blood. Do not take these medicines unless your health care provider tells you to take them. Taking over-the-counter medicines, vitamins, herbs, and supplements. Follow instructions from your health care provider about eating or drinking restrictions. Plan to have a responsible adult take you home from the hospital or clinic. Plan to have a responsible adult care for you for the time you are told after you leave the hospital or clinic. This is important. What happens during the procedure? You may be given a medicine to help you relax (sedative). A numbing medicine may be sprayed into the back of your throat, or  you may gargle the medicine. Your health care provider may perform the dilatation using various surgical instruments, such as: Simple dilators. This instrument is carefully placed in the esophagus to stretch it. Guided wire bougies. This involves using an endoscope to insert a wire into the esophagus. A dilator is passed over this wire to enlarge the esophagus. Then the wire is removed. Balloon dilators. An endoscope with a small balloon is inserted into the esophagus. The balloon is inflated to stretch the esophagus and open it up. The procedure may vary among health care providers and hospitals. What can I expect after the procedure? Your blood pressure, heart rate, breathing rate, and blood oxygen level will be monitored until you leave the hospital or clinic. Your throat may feel slightly sore and numb. This will get better over time. You will not be allowed to eat or drink until your throat is no longer numb. When you are able to drink, urinate, and sit on the edge of the bed without nausea or dizziness, you may be able to return home. Follow these instructions at home: Take over-the-counter and prescription medicines only as told by your health care  provider. If you were given a sedative during the procedure, it can affect you for several hours. Do not drive or operate machinery until your health care provider says that it is safe. Plan to have a responsible adult care for you for the time you are told. This is important. Follow instructions from your health care provider about any eating or drinking restrictions. Do not use any products that contain nicotine or tobacco, such as cigarettes, e-cigarettes, and chewing tobacco. If you need help quitting, ask your health care provider. Keep all follow-up visits. This is important. Contact a health care provider if: You have a fever. You have pain that is not relieved by medicine. Get help right away if: You have chest pain. You have trouble  breathing. You have trouble swallowing. You vomit blood. You have black, tarry, or bloody stools. These symptoms may represent a serious problem that is an emergency. Do not wait to see if the symptoms will go away. Get medical help right away. Call your local emergency services (911 in the [place] Do not drive yourself to the hospital. Summary Esophageal dilatation, also called esophageal dilation, is a procedure to widen or open a blocked or narrowed part of the esophagus. Plan to have a responsible adult take you home from the hospital or clinic. For this procedure, a numbing medicine may be sprayed into the back of your throat, or you may gargle the medicine. Do not drive or operate machinery until your health care provider says that it is safe. This information is not intended to replace advice given to you by your health care provider. Make sure you discuss any questions you have with your health care provider. Document Revised: 12/12/2019 Document Reviewed: 12/12/2019 Elsevier Patient Education  [place]. Colonoscopy, Adult, Care After The following information offers guidance on how to care for yourself after your procedure. Your health care provider may also give you more specific instructions. If you have problems or questions, contact your health care provider. What can I expect after the procedure? After the procedure, it is common to have: A small amount of blood in your stool for 24 hours after the procedure. Some gas. Mild cramping or bloating of your abdomen. Follow these instructions at home: Eating and drinking  Drink enough fluid to keep your urine pale yellow. Follow instructions from your health care provider about eating or drinking restrictions. Resume your normal diet as told by your health care provider. Avoid heavy or fried foods that are hard to digest. Activity Rest as told by your health care provider. Avoid sitting for a long time without moving. Get  up to take short walks every 1-2 hours. This is important to improve blood flow and breathing. Ask for help if you feel weak or unsteady. Return to your normal activities as told by your health care provider. Ask your health care provider what activities are safe for you. Managing cramping and bloating  Try walking around when you have cramps or feel bloated. If directed, apply heat to your abdomen as told by your health care provider. Use the heat source that your health care provider recommends, such as a moist heat pack or a heating pad. Place a towel between your skin and the heat source. Leave the heat on for 20-30 minutes. Remove the heat if your skin turns bright red. This is especially important if you are unable to feel pain, heat, or cold. You have a greater risk of getting burned. General instructions If  you were given a sedative during the procedure, it can affect you for several hours. Do not drive or operate machinery until your health care provider says that it is safe. For the first 24 hours after the procedure: Do not sign important documents. Do not drink alcohol. Do your regular daily activities at a slower pace than normal. Eat soft foods that are easy to digest. Take over-the-counter and prescription medicines only as told by your health care provider. Keep all follow-up visits. This is important. Contact a health care provider if: You have blood in your stool 2-3 days after the procedure. Get help right away if: You have more than a small spotting of blood in your stool. You have large blood clots in your stool. You have swelling of your abdomen. You have nausea or vomiting. You have a fever. You have increasing pain in your abdomen that is not relieved with medicine. These symptoms may be an emergency. Get help right away. Call 911. Do not wait to see if the symptoms will go away. Do not drive yourself to the hospital. Summary After the procedure, it is common to  have a small amount of blood in your stool. You may also have mild cramping and bloating of your abdomen. If you were given a sedative during the procedure, it can affect you for several hours. Do not drive or operate machinery until your health care provider says that it is safe. Get help right away if you have a lot of blood in your stool, nausea or vomiting, a fever, or increased pain in your abdomen. This information is not intended to replace advice given to you by your health care provider. Make sure you discuss any questions you have with your health care provider. Document Revised: 03/18/2021 Document Reviewed: 03/18/2021 Elsevier Patient Education  [place] After This sheet gives you information about how to care for yourself after your procedure. Your health care provider may also give you more specific instructions. If you have problems or questions, contact your health care provider. What can I expect after the procedure? After the procedure, it is common to have: Tiredness. Forgetfulness about what happened after the procedure. Impaired judgment for important decisions. Nausea or vomiting. Some difficulty with balance. Follow these instructions at home: For the time period you were told by your health care provider:     Rest as needed. Do not participate in activities where you could fall or become injured. Do not drive or use machinery. Do not drink alcohol. Do not take sleeping pills or medicines that cause drowsiness. Do not make important decisions or sign legal documents. Do not take care of children on your own. Eating and drinking Follow the diet that is recommended by your health care provider. Drink enough fluid to keep your urine pale yellow. If you vomit: Drink water, juice, or soup when you can drink without vomiting. Make sure you have little or no nausea before eating solid foods. General instructions Have a  responsible adult stay with you for the time you are told. It is important to have someone help care for you until you are awake and alert. Take over-the-counter and prescription medicines only as told by your health care provider. If you have sleep apnea, surgery and certain medicines can increase your risk for breathing problems. Follow instructions from your health care provider about wearing your sleep device: Anytime you are sleeping, including during daytime naps. While taking prescription pain medicines,  sleeping medicines, or medicines that make you drowsy. Avoid smoking. Keep all follow-up visits as told by your health care provider. This is important. Contact a health care provider if: You keep feeling nauseous or you keep vomiting. You feel light-headed. You are still sleepy or having trouble with balance after 24 hours. You develop a rash. You have a fever. You have redness or swelling around the IV site. Get help right away if: You have trouble breathing. You have new-onset confusion at home. Summary For several hours after your procedure, you may feel tired. You may also be forgetful and have poor judgment. Have a responsible adult stay with you for the time you are told. It is important to have someone help care for you until you are awake and alert. Rest as told. Do not drive or operate machinery. Do not drink alcohol or take sleeping pills. Get help right away if you have trouble breathing, or if you suddenly become confused. This information is not intended to replace advice given to you by your health care provider. Make sure you discuss any questions you have with your health care provider. Document Revised: 06/30/2021 Document Reviewed: 06/28/2019 Elsevier Patient Education  [place].

## 2022-01-06 ENCOUNTER — Encounter (HOSPITAL_COMMUNITY): Payer: Self-pay

## 2022-01-06 ENCOUNTER — Telehealth: Payer: Self-pay

## 2022-01-06 ENCOUNTER — Encounter (HOSPITAL_COMMUNITY)
Admission: RE | Admit: 2022-01-06 | Discharge: 2022-01-06 | Disposition: A | Discharge status: Home / Self Care | Payer: Medicare HMO | Source: Ambulatory Visit | Attending: Internal Medicine | Admitting: Internal Medicine

## 2022-01-06 DIAGNOSIS — E1100 Type 2 diabetes mellitus with hyperosmolarity without nonketotic hyperglycemic-hyperosmolar coma (NKHHC): Secondary | ICD-10-CM

## 2022-01-06 NOTE — Pre-Procedure Instructions (Signed)
Notified office via interoffice message, that patient did not show for his pre-op.

## 2022-01-06 NOTE — Telephone Encounter (Signed)
-----   Message from Encarnacion Chu, RN sent at 01/06/2022  1:44 PM EDT ----- Regarding: no show Good afternoon ladies! Gregory Mitchell did not show for his pre-op this afternoon.

## 2022-01-06 NOTE — Telephone Encounter (Signed)
Tried to call pt x2, phone rings several times then stops.

## 2022-01-07 NOTE — Telephone Encounter (Signed)
Daughter Ralph Leyden) LMOVM to cancel TCS/EGD scheduled for tomorrow.  Called daughter, he's having some health problems and unable to do procedure at this time. Advised her to call back when he's able to proceed. Endo scheduler informed.

## 2022-01-08 ENCOUNTER — Encounter (HOSPITAL_COMMUNITY): Admission: RE | Payer: Self-pay | Source: Home / Self Care

## 2022-01-08 ENCOUNTER — Ambulatory Visit (HOSPITAL_COMMUNITY): Admission: RE | Admit: 2022-01-08 | Payer: Medicare HMO | Source: Home / Self Care

## 2022-01-08 SURGERY — COLONOSCOPY WITH PROPOFOL
Anesthesia: Monitor Anesthesia Care

## 2022-01-19 NOTE — Telephone Encounter (Signed)
Called daughter Ralph Leyden to schedule procedure. She wants to know the results from his lab work he had done to see if both procedures are still needed.

## 2022-01-19 NOTE — Telephone Encounter (Signed)
Pt's daughter Ralph Leyden) phoned back and wants to reschedule the TCS/EGD. Pt is able to proceed @ this point

## 2022-01-26 ENCOUNTER — Telehealth: Payer: Self-pay

## 2022-01-26 NOTE — Telephone Encounter (Signed)
Clearance has been sent as requested.

## 2022-01-26 NOTE — Telephone Encounter (Signed)
Attention: Preop   We would like to request CARDIAC CLEARANCE for this patient please.  Procedure: Colonoscopy, EGD with possible dilation  Date: TBD  Medication to hold: N/A  Surgeon: Dr. Abbey Chatters  Phone: [phone]  Fax:  [phone]  Type of Anesthesia: Propofol

## 2022-01-26 NOTE — Telephone Encounter (Signed)
This was already addressed yesterday, see result note. Gregory Mitchell has spoken to [facility] and they want to move forward with rescheduling procedures.   However, per my instructions (see result note), he will need cardiology clearance now. He was seen by cardiology 12/18/21 so hopefully they can provide input without another ov. Please send request for clearance.

## 2022-01-26 NOTE — Telephone Encounter (Signed)
Pt daughter returned call. Her father (the pt) wants results of lab work and wants to reschedule both procedures.

## 2022-01-27 NOTE — Telephone Encounter (Signed)
    Name: Gregory Mitchell  DOB: 1941/02/12  MRN: [id number]  Primary Cardiologist: Carlyle Dolly, MD   Preoperative team, please contact this patient and set up a phone call appointment for further preoperative risk assessment. Please obtain consent and complete medication review. Thank you for your help.  I confirm that guidance regarding antiplatelet and oral anticoagulation therapy has been completed and, if necessary, noted below.    Gregory Bottcher, PA 01/27/2022, 8:51 AM [place] [address] [place], [place] [address]

## 2022-01-27 NOTE — Telephone Encounter (Signed)
1st attempt to reach pt regarding surgical clearance and the need for a tele visit, left a message for pt to call back and ask for the preop team.  

## 2022-01-28 ENCOUNTER — Ambulatory Visit (HOSPITAL_COMMUNITY)
Admission: RE | Admit: 2022-01-28 | Discharge: 2022-01-28 | Disposition: A | Discharge status: Home / Self Care | Payer: Medicare HMO | Source: Ambulatory Visit | Attending: Internal Medicine | Admitting: Internal Medicine

## 2022-01-28 ENCOUNTER — Encounter: Payer: Self-pay | Admitting: Internal Medicine

## 2022-01-28 ENCOUNTER — Ambulatory Visit (INDEPENDENT_AMBULATORY_CARE_PROVIDER_SITE_OTHER): Payer: Medicare HMO | Admitting: Internal Medicine

## 2022-01-28 ENCOUNTER — Telehealth: Payer: Self-pay

## 2022-01-28 DIAGNOSIS — J9611 Chronic respiratory failure with hypoxia: Secondary | ICD-10-CM | POA: Insufficient documentation

## 2022-01-28 DIAGNOSIS — J449 Chronic obstructive pulmonary disease, unspecified: Secondary | ICD-10-CM

## 2022-01-28 MED ORDER — BUDESONIDE 0.25 MG/2ML IN SUSP: 0.2500 mg | Freq: Two times a day (BID) | RESPIRATORY_TRACT | 12 refills | Status: DC

## 2022-01-28 MED ORDER — PREDNISONE 10 MG PO TABS: ORAL_TABLET | ORAL | 0 refills | Status: DC

## 2022-01-28 MED ORDER — PANTOPRAZOLE SODIUM 40 MG PO TBEC: DELAYED_RELEASE_TABLET | ORAL | 11 refills | Status: DC

## 2022-01-28 MED ORDER — IPRATROPIUM-ALBUTEROL 0.5-2.5 (3) MG/3ML IN SOLN: 3.0000 mL | Freq: Four times a day (QID) | RESPIRATORY_TRACT | 11 refills | Status: DC

## 2022-01-28 NOTE — Progress Notes (Signed)
Gregory Mitchell, male    DOB: 08-27-1940    MRN: [id number]   Brief patient profile:  71   yobm  quit smoking 1978 no apparent  resp sequelae referred to pulmonary clinic in [facility]  01/28/2022 for copd eval  by Bernerd Pho PA following in cards clinic for severe chf     Prior pulmonary eval 2015 c/o doe x 2 y with GOLD 3 criteria at that point with hrct 11/16/13 with bronchiectasis ? MAI   Rx 02 around 2021  ? After covid ?     History of Present Illness  01/28/2022  Pulmonary/ 1st office eval/ [facility] / [name] / trelegy main t Chief Complaint  Patient presents with   Consult    Ref by cardiology for copd  does have O2 at home he uses prn    Dyspnea:  maybe 100 ft at grocery handicap parking  Cough: x  years worse x one year p covid 19 in 05/2020 along with 02 need assoc with hoarsness / min  white mucus production worse in ams Sleep:on side flat bed 2 pillows  SABA use: not really helping  02 :  3lpm hs and prn but not titrating   No obvious day to day or daytime pattern/variability or assoc excess/ purulent sputum or mucus plugs or hemoptysis or cp or chest tightness, subjective wheeze or overt sinus or hb symptoms.   Sleeping  without nocturnal    exacerbation  of respiratory  c/o's or need for noct saba. Also denies any obvious fluctuation of symptoms with weather or environmental changes or other aggravating or alleviating factors except as outlined above   No unusual exposure hx or h/o childhood pna/ asthma or knowledge of premature birth.  Current Allergies, Complete Past Medical History, Past Surgical History, Family History, and Social History were reviewed in [employer] record.  ROS  The following are not active complaints unless bolded Hoarseness, sore throat, dysphagia, dental problems, itching, sneezing,  nasal congestion or discharge of excess mucus or purulent secretions, ear ache,   fever, chills, sweats, unintended wt loss or  wt gain, classically pleuritic or exertional cp,  orthopnea pnd or arm/hand swelling  or leg swelling, presyncope, palpitations, abdominal pain, anorexia, nausea, vomiting, diarrhea  or change in bowel habits or change in bladder habits, change in stools or change in urine, dysuria, hematuria,  rash, arthralgias, visual complaints, headache, numbness, weakness or ataxia or problems with walking or coordination,  change in mood or  memory.           Past Medical History:  Diagnosis Date   Asthma    Chronic renal insufficiency    COPD (chronic obstructive pulmonary disease) (HCC)    Heart failure ([place])    a. EF 25-30% in 08/2019 with cath showing normal cors b. EF 20-25% in 09/2020 c. 30-35% in 02/2021 d. EF 20-25% in 10/2021   Hypertension    Pulmonary emboli ([place]) 03/2019   Renal insufficiency    Type 2 diabetes mellitus ([place])     Outpatient Medications Prior to Visit - - NOTE:   Unable to verify as accurately reflecting what pt takes    Medication Sig Dispense Refill   albuterol (PROVENTIL) (5 MG/ML) 0.5% nebulizer solution Inhale 2.5 mg into the lungs every 4 (four) hours as needed for shortness of breath or wheezing.     albuterol (VENTOLIN HFA) 108 (90 Base) MCG/ACT inhaler Inhale 2 puffs into the lungs every 6 (six)  hours as needed for wheezing or shortness of breath.      diclofenac Sodium (VOLTAREN) 1 % GEL Apply 1 application topically 4 (four) times daily as needed (pain).     FARXIGA 10 MG TABS tablet TAKE 1 TABLET BY MOUTH DAILY BEFORE BREAKFAST 30 tablet 11   ferrous sulfate 325 (65 FE) MG tablet Take 1 tablet (325 mg total) by mouth daily. 30 tablet 3   HM SENNA-S 8.6-50 MG tablet Take 2 tablets by mouth daily.     HYDROcodone-acetaminophen (NORCO) 7.5-325 MG tablet Take 1 tablet by mouth 2 (two) times daily as needed for moderate pain.     ipratropium-albuterol (DUONEB) 0.5-2.5 (3) MG/3ML SOLN Take 3 mLs by nebulization every 6 (six) hours as needed (for shortness of  breath/wheezing).      lactulose (CHRONULAC) 10 GM/15ML solution Take 30 mLs (20 g total) by mouth 3 (three) times daily as needed for mild constipation. 1800 mL 3   metoprolol succinate (TOPROL XL) 25 MG 24 hr tablet Take 1 tablet (25 mg total) by mouth daily. 90 tablet 3   OXYGEN Inhale 4 L into the lungs at bedtime as needed (for shortness of breath).     pantoprazole (PROTONIX) 40 MG tablet Take 40 mg by mouth 2 (two) times daily.     polyethylene glycol-electrolytes (NULYTELY) 420 g solution As directed 4000 mL 0   sacubitril-valsartan (ENTRESTO) 49-51 MG Take 1 tablet by mouth 2 (two) times daily. 60 tablet 6   tamsulosin (FLOMAX) 0.4 MG CAPS capsule Take 0.8 mg by mouth 2 (two) times daily.     torsemide (DEMADEX) 20 MG tablet Take 1 tablet (20 mg total) by mouth daily. 60 tablet 6   TRELEGY ELLIPTA 200-62.5-25 MCG/ACT AEPB Take 1 puff by mouth daily.        Objective:     BP 140/72 (BP Location: Right Arm, Patient Position: Sitting)   Pulse 78   Temp 98.4 F (36.9 C) (Temporal)   Ht '5\' 6"'$  (1.676 m)   Wt 148 lb (67.1 kg)   SpO2 (!) 88% Comment: ra from lobby to room  BMI 23.89 kg/m   SpO2: (!) 88 % (ra from lobby to room)  Hoarse amb bm nad / junky congested sounding  cough \  HEENT : Oropharynx  clear   Nasal turbinates nl    NECK :  without  apparent JVD/ palpable Nodes/TM    LUNGS: no acc muscle use,  Mild barrel  contour chest wall with bilateral coarse exp rhonchi  and  without cough on insp or exp maneuvers  and mild  Hyperresonant  to  percussion bilaterally     CV:  RRR  no s3 or murmur or increase in P2, and no edema   ABD:  soft and nontender with pos end  insp Hoover's  in the supine position.  No bruits or organomegaly appreciated   MS:  Nl gait/ ext warm without deformities Or obvious joint restrictions  calf tenderness, cyanosis or clubbing     SKIN: warm and dry without lesions    NEURO:  alert, approp, nl sensorium with  no motor or cerebellar  deficits apparent.     CXR PA and Lateral:   01/28/2022 :    I personally reviewed images and impression is as follows:     Cm / mod copd changes nothing acute     Assessment   COPD GOLD 3 / obstructive bronchiectasis by HRCT in 2015  Quit smoking  1978 PFT 11/16/13   FEV1 37%, ratio 61 , no sign BD response Decreased FVC 45% (19% BD response) . DLCO was unable to be done.  - pulmonary eval 01/28/2022 severe coughing on trelegy and entresto rec duoneb/flutter/mucinex dm and stop trelegy  Then ? Trial off entresto and on just valsartan?   His cough is much worse than his dyspnea at this point with no acute changes on cxr and lots of upper airway symptoms.  DDX of  difficult airways management almost all start with A and  include Adherence, Ace Inhibitors, Acid Reflux, Active Sinus Disease, Alpha 1 Antitripsin deficiency, Anxiety masquerading as Airways dz,  ABPA,  Allergy(esp in young), Aspiration (esp in elderly), Adverse effects of meds,  Active smoking or vaping, A bunch of PE's (a small clot burden can't cause this syndrome unless there is already severe underlying pulm or vascular dz with poor reserve) plus two Bs  = Bronchiectasis and Beta blocker use..and one C= CHF  Adherence is always the initial "prime suspect" and is a multilayered concern that requires a "trust but verify" approach in every patient - starting with knowing how to use medications, especially inhalers, correctly, keeping up with refills and understanding the fundamental difference between maintenance and prns vs those medications only taken for a very short course and then stopped and not refilled.  - very poor insight into meds with multiple providers not all on epic - return with all meds in hand using a trust but verify approach to confirm accurate Medication  Reconciliation The principal here is that until we are certain that the  patients are doing what we've asked, it makes no sense to ask them to do more.   ?  Adverse effects of dpi > try off   ACEi adverse effects at the  top of the usual list of suspects and the only way to rule it out is a trial off  - The sacubitrilat component of entresto is not and ACEi but it does lead to higher levels of bradykinin (the culprit in ACEi related cough) because it reduces Neprilysin based clearance of bradykinin. The typical symptoms are dry daytime cough (9% per PI) or complaints of a new sensation of globus or excess PNDS but really no excess mucus, which is the case here. >>> first try off trelegy then regroup in 4 weeks    ? Alpha one> exceptionally rare in AA pts  ? Acid (or non-acid) GERD > always difficult to exclude as up to 75% of pts in some series report no assoc GI/ Heartburn symptoms> rec max (24h)  acid suppression and diet restrictions/ reviewed and instructions given in writing.   ? Allergy > check igE/ Eos/ Prednisone 10 mg take  4 each am x 2 days,   2 each am x 2 days,  1 each am x 2 days and stop and add budesonide 0.25 mcg bid to duoneb   Bronchiectasis > flutter valve and mucinex max doses   Chf / cardiac asthma > rx per cards         Chronic respiratory failure with hypoxia ([place]) Placed on 02 around 2021  - 88% p walking 100 ft 01/28/2022 > rec 3lpm and titrate daytime to keep > 90% at all times as has chf   Each maintenance medication was reviewed in detail including emphasizing most importantly the difference between maintenance and prns and under what circumstances the prns are to be triggered using an action plan format where appropriate.  Total time for H and P, chart review, counseling, reviewing neb/02 device(s) , directly observing portions of ambulatory 02 saturation study/ and generating customized AVS unique to this office visit / same day charting = > 29mn for new pt with  multiple  refractory respiratory  symptoms of uncertain etiology                   MChristinia Gully MD 01/28/2022

## 2022-01-28 NOTE — Telephone Encounter (Signed)
Pt's wife is calling back for preop surgical clearance and request call back.

## 2022-01-28 NOTE — Assessment & Plan Note (Signed)
Placed on 02 around 2021  - 88% p walking 100 ft 01/28/2022 > rec 3lpm and titrate daytime to keep > 90% at all times as has chf   Each maintenance medication was reviewed in detail including emphasizing most importantly the difference between maintenance and prns and under what circumstances the prns are to be triggered using an action plan format where appropriate.  Total time for H and P, chart review, counseling, reviewing neb/02 device(s) , directly observing portions of ambulatory 02 saturation study/ and generating customized AVS unique to this office visit / same day charting = > 7mn for new pt with  multiple  refractory respiratory  symptoms of uncertain etiology

## 2022-01-28 NOTE — Assessment & Plan Note (Addendum)
Quit smoking 1978 PFT 11/16/13   FEV1 37%, ratio 61 , no sign BD response Decreased FVC 45% (19% BD response) . DLCO was unable to be done.  - pulmonary eval 01/28/2022 severe coughing on trelegy and entresto rec duoneb/flutter/mucinex dm and stop trelegy  Then ? Trial off entresto and on just valsartan?   His cough is much worse than his dyspnea at this point with no acute changes on cxr and lots of upper airway symptoms.  DDX of  difficult airways management almost all start with A and  include Adherence, Ace Inhibitors, Acid Reflux, Active Sinus Disease, Alpha 1 Antitripsin deficiency, Anxiety masquerading as Airways dz,  ABPA,  Allergy(esp in young), Aspiration (esp in elderly), Adverse effects of meds,  Active smoking or vaping, A bunch of PE's (a small clot burden can't cause this syndrome unless there is already severe underlying pulm or vascular dz with poor reserve) plus two Bs  = Bronchiectasis and Beta blocker use..and one C= CHF  Adherence is always the initial "prime suspect" and is a multilayered concern that requires a "trust but verify" approach in every patient - starting with knowing how to use medications, especially inhalers, correctly, keeping up with refills and understanding the fundamental difference between maintenance and prns vs those medications only taken for a very short course and then stopped and not refilled.  - very poor insight into meds with multiple providers not all on epic - return with all meds in hand using a trust but verify approach to confirm accurate Medication  Reconciliation The principal here is that until we are certain that the  patients are doing what we've asked, it makes no sense to ask them to do more.   ? Adverse effects of dpi > try off   ACEi adverse effects at the  top of the usual list of suspects and the only way to rule it out is a trial off  - The sacubitrilat component of entresto is not and ACEi but it does lead to higher levels of  bradykinin (the culprit in ACEi related cough) because it reduces Neprilysin based clearance of bradykinin. The typical symptoms are dry daytime cough (9% per PI) or complaints of a new sensation of globus or excess PNDS but really no excess mucus, which is the case here. >>> first try off trelegy then regroup in 4 weeks    ? Alpha one> exceptionally rare in AA pts  ? Acid (or non-acid) GERD > always difficult to exclude as up to 75% of pts in some series report no assoc GI/ Heartburn symptoms> rec max (24h)  acid suppression and diet restrictions/ reviewed and instructions given in writing.   ? Allergy > check igE/ Eos/ Prednisone 10 mg take  4 each am x 2 days,   2 each am x 2 days,  1 each am x 2 days and stop and add budesonide 0.25 mcg bid to duoneb   Bronchiectasis > flutter valve and mucinex max doses   Chf / cardiac asthma > rx per cards

## 2022-01-28 NOTE — Patient Instructions (Addendum)
Stop trelegy and the ventolin / albuterol   Pantoprazole 40 mg Take 30- 60 min before your first and last meals of the day   Take duoneb  four times daily   Add budesonide 0.25 mg twice daily to the nebulizer   Prednisone 10 mg take  4 each am x 2 days,   2 each am x 2 days,  1 each am x 2 days and stop   Please remember to go to the lab department   for your tests - we will call you with the results when they are available.     Please remember to go to the  x-ray department  @  [facility] for your tests - we will call you with the results when they are available      Wear 02 so you keep your 02 saturation s above 90% at all time   For cough > mucinex 1200 mg every 12 hours and Use the flutter valve as much possible   Please schedule a follow up office visit in 4 weeks, sooner if needed  with all medications /inhalers/ solutions in hand so we can verify exactly what you are taking. This includes all medications from all doctors and over the counters

## 2022-01-28 NOTE — Telephone Encounter (Signed)
  Patient Consent for Virtual Visit        Gregory Mitchell has provided verbal consent on 01/28/2022 for a virtual visit (video or telephone).   CONSENT FOR VIRTUAL VISIT FOR:  Gregory Mitchell  By participating in this virtual visit I agree to the following:  I hereby voluntarily request, consent and authorize [facility] and its employed or contracted physicians, physician assistants, nurse practitioners or other licensed health care professionals (the Practitioner), to provide me with telemedicine health care services (the "Services") as deemed necessary by the treating Practitioner. I acknowledge and consent to receive the Services by the Practitioner via telemedicine. I understand that the telemedicine visit will involve communicating with the Practitioner through live audiovisual communication technology and the disclosure of certain medical information by electronic transmission. I acknowledge that I have been given the opportunity to request an in-person assessment or other available alternative prior to the telemedicine visit and am voluntarily participating in the telemedicine visit.  I understand that I have the right to withhold or withdraw my consent to the use of telemedicine in the course of my care at any time, without affecting my right to future care or treatment, and that the Practitioner or I may terminate the telemedicine visit at any time. I understand that I have the right to inspect all information obtained and/or recorded in the course of the telemedicine visit and may receive copies of available information for a reasonable fee.  I understand that some of the potential risks of receiving the Services via telemedicine include:  Delay or interruption in medical evaluation due to technological equipment failure or disruption; Information transmitted may not be sufficient (e.g. poor resolution of images) to allow for appropriate medical decision making by the Practitioner; and/or   In rare instances, security protocols could fail, causing a breach of personal health information.  Furthermore, I acknowledge that it is my responsibility to provide information about my medical history, conditions and care that is complete and accurate to the best of my ability. I acknowledge that Practitioner's advice, recommendations, and/or decision may be based on factors not within their control, such as incomplete or inaccurate data provided by me or distortions of diagnostic images or specimens that may result from electronic transmissions. I understand that the practice of medicine is not an exact science and that Practitioner makes no warranties or guarantees regarding treatment outcomes. I acknowledge that a copy of this consent can be made available to me via my patient portal ([facility]), or I can request a printed copy by calling the office of [facility].    I understand that my insurance will be billed for this visit.   I have read or had this consent read to me. I understand the contents of this consent, which adequately explains the benefits and risks of the Services being provided via telemedicine.  I have been provided ample opportunity to ask questions regarding this consent and the Services and have had my questions answered to my satisfaction. I give my informed consent for the services to be provided through the use of telemedicine in my medical care

## 2022-01-29 ENCOUNTER — Ambulatory Visit (INDEPENDENT_AMBULATORY_CARE_PROVIDER_SITE_OTHER): Payer: Medicare HMO | Admitting: Physician Assistant

## 2022-01-29 DIAGNOSIS — Z01810 Encounter for preprocedural cardiovascular examination: Secondary | ICD-10-CM

## 2022-01-29 NOTE — Telephone Encounter (Signed)
See today's virtual visit, patient has been cleared to proceed.

## 2022-01-31 LAB — BASIC METABOLIC PANEL
BUN/Creatinine Ratio: 10 (ref 10–24)
BUN: 14 mg/dL (ref 8–27)
CO2: 24 mmol/L (ref 20–29)
Calcium: 8.9 mg/dL (ref 8.6–10.2)
Chloride: 105 mmol/L (ref 96–106)
Creatinine, Ser: 1.34 mg/dL — ABNORMAL HIGH (ref 0.76–1.27)
Glucose: 83 mg/dL (ref 70–99)
Potassium: 5.3 mmol/L — ABNORMAL HIGH (ref 3.5–5.2)
Sodium: 141 mmol/L (ref 134–144)
eGFR: 53 mL/min/{1.73_m2} — ABNORMAL LOW (ref >59)

## 2022-01-31 LAB — CBC WITH DIFFERENTIAL/PLATELET
Basophils Absolute: 0 10*3/uL (ref 0.0–0.2)
Basos: 1 %
EOS (ABSOLUTE): 0.2 10*3/uL (ref 0.0–0.4)
Eos: 4 %
Hematocrit: 30.9 % — ABNORMAL LOW (ref 37.5–51.0)
Hemoglobin: 9.4 g/dL — ABNORMAL LOW (ref 13.0–17.7)
Immature Grans (Abs): 0 10*3/uL (ref 0.0–0.1)
Immature Granulocytes: 0 %
Lymphocytes Absolute: 1.6 10*3/uL (ref 0.7–3.1)
Lymphs: 30 %
MCH: 27.5 pg (ref 26.6–33.0)
MCHC: 30.4 g/dL — ABNORMAL LOW (ref 31.5–35.7)
MCV: 90 fL (ref 79–97)
Monocytes Absolute: 0.5 10*3/uL (ref 0.1–0.9)
Monocytes: 10 %
Neutrophils Absolute: 2.9 10*3/uL (ref 1.4–7.0)
Neutrophils: 55 %
Platelets: 290 10*3/uL (ref 150–450)
RBC: 3.42 x10E6/uL — ABNORMAL LOW (ref 4.14–5.80)
RDW: 14 % (ref 11.6–15.4)
WBC: 5.2 10*3/uL (ref 3.4–10.8)

## 2022-01-31 LAB — IGE: IgE (Immunoglobulin E), Serum: 7 IU/mL (ref 6–495)

## 2022-01-31 LAB — SEDIMENTATION RATE: Sed Rate: 41 mm/hr — ABNORMAL HIGH (ref 0–30)

## 2022-02-01 ENCOUNTER — Telehealth: Payer: Self-pay | Admitting: Internal Medicine

## 2022-02-01 NOTE — Telephone Encounter (Signed)
ATC patient/ Gregory Mitchell back and left detailed message on vm (ok per dpr) with results. Advised them to call back for concerns or discuss patient feeling unwell.

## 2022-02-02 NOTE — Telephone Encounter (Signed)
Cardiology has cleared patient but cautions regarding respiratory issues. Since my last recommendations he has now seen pulmonology. We last saw in 11/2021. To best take care of patient, I would like to get pulmonology input/clearance for EGD/colonoscopy.  We should also make arrangements for at least updated virtual visit with patient from GI standpoint.

## 2022-02-03 NOTE — Telephone Encounter (Signed)
Addendum:  He is at moderate risk, not moderately high - if high risk of not doing procedure between now and next ov then ok to clear for  procedure

## 2022-02-03 NOTE — Telephone Encounter (Signed)
Daughter answered call and went over recs with her from Dr. Melvyn Novas. She voiced understanding. Nothing further needed.

## 2022-02-03 NOTE — Telephone Encounter (Signed)
If at all possible, need to wait until p ov planned for 02/24/22 as he is very complex and mod high risk

## 2022-02-03 NOTE — Telephone Encounter (Signed)
ATC. LVM to call back for questions.

## 2022-02-03 NOTE — Telephone Encounter (Signed)
Please update patient's daughter all all provided below. Cardiology clearance obtained but need further input from pulmonary etc as outlined.   Let's see patient back, in person or virtual sometime after Dr. Gustavus Bryant appt on 02/24/22 to discuss risks/benefits of moving forward with EGD/colonoscopy based on what pulmonology says.

## 2022-02-03 NOTE — Telephone Encounter (Signed)
Neil Crouch PA-C would like to get pulmonology input/clearance for EGD/colonoscopy for this patient.

## 2022-02-24 ENCOUNTER — Ambulatory Visit: Payer: Medicare HMO | Admitting: Internal Medicine

## 2022-02-24 ENCOUNTER — Encounter: Payer: Self-pay | Admitting: Internal Medicine

## 2022-02-24 VITALS — BP 138/78 | HR 74 | Temp 98.6°F | Ht 66.0 in | Wt 151.2 lb

## 2022-02-24 DIAGNOSIS — J449 Chronic obstructive pulmonary disease, unspecified: Secondary | ICD-10-CM

## 2022-02-24 DIAGNOSIS — J9611 Chronic respiratory failure with hypoxia: Secondary | ICD-10-CM

## 2022-02-24 MED ORDER — PREDNISONE 10 MG PO TABS: ORAL_TABLET | ORAL | 0 refills | Status: DC

## 2022-02-24 MED ORDER — LEVOFLOXACIN 500 MG PO TABS: 500.0000 mg | ORAL_TABLET | Freq: Every day | ORAL | 0 refills | Status: DC

## 2022-02-24 MED ORDER — PANTOPRAZOLE SODIUM 40 MG PO TBEC: DELAYED_RELEASE_TABLET | ORAL | 11 refills | Status: DC

## 2022-02-24 MED ORDER — METHYLPREDNISOLONE ACETATE 80 MG/ML IJ SUSP
80.0000 mg | Freq: Once | INTRAMUSCULAR | Status: AC
  Administered 2022-02-24: 80 mg via INTRAMUSCULAR

## 2022-02-24 NOTE — Progress Notes (Signed)
Gregory Mitchell, male    DOB: 1940-08-18    MRN: [id number]   Brief patient profile:  75   yobm  quit smoking 1978 no apparent  resp sequelae referred to pulmonary clinic in [place] [facility]  01/28/2022 for copd eval  by Gregory Mitchell following in cards clinic for severe chf     Prior pulmonary eval 2015 c/o doe x 2 y with GOLD 3 criteria at that point with hrct 11/16/13 with bronchiectasis ? MAI   Rx 02 around 2021  ? After covid ?   History of Present Illness  01/28/2022  Pulmonary/ 1st office eval/ Gregory Mitchell / [facility] / trelegy maint Chief Complaint  Patient presents with   Consult    Ref by cardiology for copd  does have O2 at home he uses prn   Dyspnea:  maybe 100 ft at grocery handicap parking  Cough: x  years worse x one year p covid 19 in 05/2020 along with 02 need assoc with hoarsness / min  white mucus production worse in ams Sleep:on side flat bed 2 pillows  SABA use: not really helping  02 :  3lpm hs and prn but not titrating  Rec Stop trelegy and the ventolin / albuterol  Pantoprazole 40 mg Take 30- 60 min before your first and last meals of the day  Take duoneb  four times daily  Add budesonide 0.25 mg twice daily to the nebulizer  Prednisone 10 mg take  4 each am x 2 days,   2 each am x 2 days,  1 each am x 2 days and stop      Allergy screen 01/28/22  >  Eos 0.2 /  IgE  / ESR 41.  Wear 02 so you keep your 02 saturation s above 90% at all time  For cough > mucinex 1200 mg every 12 hours and Use the flutter valve as much possible Please schedule a follow up office visit in 4 weeks, sooner if needed  with all medications /inhalers/ solutions     02/24/2022  f/u ov/[place] office/Gregory Mitchell re: copd/uacs  maint on nebs but confused on which ones when / also still on entresto   Chief Complaint  Patient presents with   Follow-up    Feels breathing is getting worse.   Dyspnea:  [employer] leaning on cart/ s 02  Cough: yellowish better p levaquin then worse again   Sleeping: on bed is level/flat with 2 pillows  SABA use: not much hfa 02: 4lpm 24/7  Covid status: vax x 2      No obvious day to day or daytime variability or assoc   mucus plugs or hemoptysis or cp or  overt sinus or hb symptoms.   Sleeping  without nocturnal  or early am exacerbation  of respiratory  c/o's or need for noct saba. Also denies any obvious fluctuation of symptoms with weather or environmental changes or other aggravating or alleviating factors except as outlined above   No unusual exposure hx or h/o childhood pna/ asthma or knowledge of premature birth.  Current Allergies, Complete Past Medical History, Past Surgical History, Family History, and Social History were reviewed in [employer] record.  ROS  The following are not active complaints unless bolded Hoarseness, sore throat, dysphagia/globus, dental problems, itching, sneezing,  nasal congestion or discharge of excess mucus or purulent secretions, ear ache,   fever, chills, sweats, unintended wt loss or wt gain, classically pleuritic or exertional cp,  orthopnea pnd or arm/hand swelling  or leg swelling, presyncope, palpitations, abdominal pain, anorexia, nausea, vomiting, diarrhea  or change in bowel habits or change in bladder habits, change in stools or change in urine, dysuria, hematuria,  rash, arthralgias, visual complaints, headache, numbness, weakness or ataxia or problems with walking or coordination,  change in mood or  memory.        Current Meds  Medication Sig   budesonide (PULMICORT) 0.25 MG/2ML nebulizer solution Take 2 mLs (0.25 mg total) by nebulization 2 (two) times daily.   diclofenac Sodium (VOLTAREN) 1 % GEL Apply 1 application topically 4 (four) times daily as needed (pain).   FARXIGA 10 MG TABS tablet TAKE 1 TABLET BY MOUTH DAILY BEFORE BREAKFAST   ferrous sulfate 325 (65 FE) MG tablet Take 1 tablet (325 mg total) by mouth daily.   HM SENNA-S 8.6-50 MG tablet Take 2 tablets  by mouth daily.   HYDROcodone-acetaminophen (NORCO) 7.5-325 MG tablet Take 1 tablet by mouth 2 (two) times daily as needed for moderate pain.   ipratropium-albuterol (DUONEB) 0.5-2.5 (3) MG/3ML SOLN Take 3 mLs by nebulization in the morning, at noon, in the evening, and at bedtime.   lactulose (CHRONULAC) 10 GM/15ML solution Take 30 mLs (20 g total) by mouth 3 (three) times daily as needed for mild constipation.   metoprolol succinate (TOPROL XL) 25 MG 24 hr tablet Take 1 tablet (25 mg total) by mouth daily.   OXYGEN Inhale 4 L into the lungs at bedtime as needed (for shortness of breath).   pantoprazole (PROTONIX) 40 MG tablet Take 30- 60 min before your first and last meals of the day   polyethylene glycol-electrolytes (NULYTELY) 420 g solution As directed   predniSONE (DELTASONE) 10 MG tablet Take  4 each am x 2 days,   2 each am x 2 days,  1 each am x 2 days and stop   sacubitril-valsartan (ENTRESTO) 49-51 MG Take 1 tablet by mouth 2 (two) times daily.   tamsulosin (FLOMAX) 0.4 MG CAPS capsule Take 0.8 mg by mouth 2 (two) times daily.   torsemide (DEMADEX) 20 MG tablet Take 1 tablet (20 mg total) by mouth daily.               Past Medical History:  Diagnosis Date   Asthma    Chronic renal insufficiency    COPD (chronic obstructive pulmonary disease) (HCC)    Heart failure ([place])    a. EF 25-30% in 08/2019 with cath showing normal cors b. EF 20-25% in 09/2020 c. 30-35% in 02/2021 d. EF 20-25% in 10/2021   Hypertension    Pulmonary emboli ([place]) 03/2019   Renal insufficiency    Type 2 diabetes mellitus (HCC)        Objective:    Wt Readings from Last 3 Encounters:  02/24/22 151 lb 3.2 oz (68.6 kg)  01/28/22 148 lb (67.1 kg)  12/18/21 149 lb (67.6 kg)    Vital signs reviewed  02/24/2022  - Note at rest 02 sats  94% on RA   General appearance:    amb extremely hoarse bm with prominent upper airway wheeze    HEENT : Oropharynx  clear / edentulous  Nasal turbinates nl   NECK  :  without  apparent JVD/ palpable Nodes/TM    LUNGS: no acc muscle use,  Mild barrel  contour chest wall with bilateral  coarse rhonchi  and  without cough on insp or exp maneuvers  and mild  Hyperresonant  to  percussion bilaterally     CV:  RRR  no s3 or murmur or increase in P2, and 1+ pitting both LEs  ABD:  soft and nontender with pos end  insp Hoover's  in the supine position.  No bruits or organomegaly appreciated   MS:  Nl gait/ ext warm without deformities Or obvious joint restrictions  calf tenderness, cyanosis or clubbing     SKIN: warm and dry without lesions    NEURO:  alert, approp, nl sensorium with  no motor or cerebellar deficits apparent.            Assessment

## 2022-02-24 NOTE — Patient Instructions (Addendum)
Protonix 40 mg Take 30- 60 min before your first and last meals of the day  - new prescription today   Prednisone Take 4 for three days 3 for three days 2 for three days 1 for three days and stop   Levaquin 500 mg daily  x 7 days    Plan A = Automatic = Always=    Aformoterol 15 mch 1st thing in am and 12 hours and budsonide  0.5   Plan B = Backup (to supplement plan A, not to replace it) Only use your albuterol nebulizer  as a rescue medication to be used if you can't catch your breath by resting or doing a relaxed purse lip breathing pattern.  - The less you use it, the better it will work when you need it. - Ok to use the nebulizer up to  4 hours if you must but call for appointment if use goes up over your usual need - Think of it like the starter fluid for your car engine   Re SABA :  I spent extra time with pt today reviewing appropriate use of albuterol for prn use on exertion with the following points: 1) saba is for relief of sob that does not improve by walking a slower pace or resting but rather if the pt does not improve after trying this first. 2) If the pt is convinced, as many are, that saba helps recover from activity faster then it's easy to tell if this is the case by re-challenging : ie stop, take the inhaler, then p 5 minutes try the exact same activity (intensity of workload) that just caused the symptoms and see if they are substantially diminished or not after saba 3) if there is an activity that reproducibly causes the symptoms, try the saba 15 min before the activity on alternate days   If in fact the saba really does help, then fine to continue to use it prn but advised may need to look closer at the maintenance regimen being used to achieve better control of airways disease with exertion.   Stop fish oil   GERD (REFLUX)  is an extremely common cause of respiratory symptoms just like yours , many times with no obvious heartburn at all.    It can be treated with  medication, but also with lifestyle changes including elevation of the head of your bed (ideally with 6 -8inch blocks under the headboard of your bed),  Smoking cessation, avoidance of late meals, excessive alcohol, and avoid fatty foods, chocolate, peppermint, colas, red wine, and acidic juices such as orange juice.  NO MINT OR MENTHOL PRODUCTS SO NO COUGH DROPS  USE SUGARLESS CANDY INSTEAD (Jolley ranchers or Stover's or Life Savers) or even ice chips will also do - the key is to swallow to prevent all throat clearing. NO OIL BASED VITAMINS - use powdered substitutes.  Avoid fish oil when coughing.   I will call Dr Harl Bowie about the [facility]   Please schedule a follow up office visit in  2 weeks  - if getting worse please go to [facility]  .

## 2022-02-25 ENCOUNTER — Other Ambulatory Visit: Payer: Self-pay

## 2022-02-25 ENCOUNTER — Encounter: Payer: Self-pay | Admitting: Internal Medicine

## 2022-02-25 MED ORDER — DAPAGLIFLOZIN PROPANEDIOL 10 MG PO TABS: 10.0000 mg | ORAL_TABLET | Freq: Every day | ORAL | 3 refills | Status: DC

## 2022-02-25 NOTE — Assessment & Plan Note (Addendum)
Quit smoking 1978 PFT 11/16/13   FEV1 37%, ratio 61 , no sign BD response Decreased FVC 45% (19% BD response) . DLCO was unable to be done.  - pulmonary eval 01/28/2022 severe coughing on trelegy and entresto rec duoneb/flutter/mucinex dm and stop trelegy  Then ? Trial off entresto and on just valsartan?  - Allergy screen 01/28/22  >  Eos 0.2 /  IgE  7 - 02/24/2022 changed to bud/perfomist bid with prn duoneb/ 12 d pred taper and levquin 500 x 7days with f/u  q 2 weeks and strongly consider d/c entresto but EF only 25% - 02/24/2022  After extensive coaching inhaler device,  effectiveness =    0% with hfa  DDX of  difficult airways management almost all start with A and  include Adherence, Ace Inhibitors, Acid Reflux, Active Sinus Disease, Alpha 1 Antitripsin deficiency, Anxiety masquerading as Airways dz,  ABPA,  Allergy(esp in young), Aspiration (esp in elderly), Adverse effects of meds,  Active smoking or vaping, A bunch of PE's (a small clot burden can't cause this syndrome unless there is already severe underlying pulm or vascular dz with poor reserve) plus two Bs  = Bronchiectasis and Beta blocker use..and one C= CHF  Adherence is always the initial "prime suspect" and is a multilayered concern that requires a "trust but verify" approach in every patient - starting with knowing how to use medications, especially inhalers, correctly, keeping up with refills and understanding the fundamental difference between maintenance and prns vs those medications only taken for a very short course and then stopped and not refilled.  - confused with maint vs prn nebs  ? Adverse drug effects:  The sacubitrilat component of entresto is not and ACEi but it does lead to higher levels of bradykinin (the culprit in ACEi related cough) because it reduces Neprilysin based clearance of bradykinin. The typical symptoms are dry daytime cough (9% per PI) or complaints of a new sensation of globus or excess PNDS which def applies  here - will ask Dr Harl Bowie if ok to change to valsartan x 2 weeks and carefully observe for worsening sob / edema  ? Acid (or non-acid) GERD > always difficult to exclude as up to 75% of pts in some series report no assoc GI/ Heartburn symptoms> rec continue max (24h)  acid suppression and diet restrictions/ reviewed     ? Allergy/asthma :  depomedrol 80 mg IM/ Continue neb  bud 0.5 mg bid and  Prednisone Take 4 for three days 3 for three days 2 for three days 1 for three days and stop   ? Bronchiectasis > levaqu 500 x 7 day, consider repeat hrct  CHF > rx per Cards   If worse on rx > admit

## 2022-02-25 NOTE — Assessment & Plan Note (Addendum)
Placed on 02 around 2021  - 88% p walking 100 ft 01/28/2022 > rec 3lpm and titrate daytime to keep > 90% at all times as has chf   Again advised: Make sure you check your oxygen saturation  AT  your highest level of activity (not after you stop)   to be sure it stays over 90% and adjust  02 flow upward to maintain this level if needed but remember to turn it back to previous settings when you stop (to conserve your supply).          Each maintenance medication was reviewed in detail including emphasizing most importantly the difference between maintenance and prns and under what circumstances the prns are to be triggered using an action plan format where appropriate.  Total time for H and P, chart review, counseling, reviewing hfa(can't use effectively) neb/ 02 device(s) and generating customized AVS unique to this office visit / same day charting > 40 min for multiple  refractory respiratory  symptoms of uncertain etiology

## 2022-02-25 NOTE — Telephone Encounter (Signed)
Pharmacy requested 100 day refill farxiga,sent

## 2022-03-08 ENCOUNTER — Ambulatory Visit: Payer: Medicare HMO | Admitting: Internal Medicine

## 2022-03-08 ENCOUNTER — Encounter: Payer: Self-pay | Admitting: Internal Medicine

## 2022-03-08 DIAGNOSIS — I5033 Acute on chronic diastolic (congestive) heart failure: Secondary | ICD-10-CM | POA: Diagnosis not present

## 2022-03-08 DIAGNOSIS — J9611 Chronic respiratory failure with hypoxia: Secondary | ICD-10-CM | POA: Diagnosis not present

## 2022-03-08 DIAGNOSIS — J449 Chronic obstructive pulmonary disease, unspecified: Secondary | ICD-10-CM

## 2022-03-08 MED ORDER — VALSARTAN 80 MG PO TABS: ORAL_TABLET | ORAL | 2 refills | Status: DC

## 2022-03-08 MED ORDER — ALBUTEROL SULFATE (2.5 MG/3ML) 0.083% IN NEBU: INHALATION_SOLUTION | RESPIRATORY_TRACT | Status: DC

## 2022-03-08 NOTE — Progress Notes (Signed)
Gregory Mitchell, male    DOB: Jul 24, 1941    MRN: [id number]   Brief patient profile:  9   yobm  quit smoking 1978 no apparent  resp sequelae referred to pulmonary clinic in [facility]  01/28/2022 for copd eval  by Bernerd Pho PA following in cards clinic for severe chf     Prior pulmonary eval 2015 c/o doe x 2 y with GOLD 3 criteria at that point with hrct 11/16/13 with bronchiectasis ? MAI   Rx 02 around 2021  ? After covid ?   History of Present Illness  01/28/2022  Pulmonary/ 1st office eval/ Marleigh Kaylor / [name] / trelegy maint Chief Complaint  Patient presents with   Consult    Ref by cardiology for copd  does have O2 at home he uses prn   Dyspnea:  maybe 100 ft at grocery handicap parking  Cough: x  years worse x one year p covid 19 in 05/2020 along with 02 need assoc with hoarsness / min  white mucus production worse in ams Sleep:on side flat bed 2 pillows  SABA use: not really helping  02 :  3lpm hs and prn but not titrating  Rec Stop trelegy and the ventolin / albuterol  Pantoprazole 40 mg Take 30- 60 min before your first and last meals of the day  Take duoneb  four times daily  Add budesonide 0.25 mg twice daily to the nebulizer  Prednisone 10 mg take  4 each am x 2 days,   2 each am x 2 days,  1 each am x 2 days and stop     Allergy screen 01/28/22  >  Eos 0.2 /  IgE  / ESR 41.  Wear 02 so you keep your 02 saturation s above 90% at all time  For cough > mucinex 1200 mg every 12 hours and Use the flutter valve as much possible Please schedule a follow up office visit in 4 weeks, sooner if needed  with all medications /inhalers/ solutions     02/24/2022  f/u ov/[place] office/Betsabe Iglesia re: copd/uacs  maint on nebs but confused on which ones when / also still on entresto   Chief Complaint  Patient presents with   Follow-up    Feels breathing is getting worse.   Dyspnea:  [employer] leaning on cart/ s 02  Cough: yellowish better p levaquin then worse again   Sleeping: on bed is level/flat with 2 pillows  SABA use: not much hfa 02: 4lpm 24/7  Covid status: vax x 2  Rec Protonix 40 mg Take 30- 60 min before your first and last meals of the day  - new prescription today  Prednisone Take 4 for three days 3 for three days 2 for three days 1 for three days and stop  Levaquin 500 mg daily  x 7 days  Plan A = Automatic = Always=    Aformoterol 15 mcg 1st thing in am and 12 hours and budsonide  0.5  Plan B = Backup (to supplement plan A, not to replace it) Only use your albuterol nebulizer  as a rescue medication Stop fish oil  GERD diet   I will call Dr Harl Bowie about the [facility]   Please schedule a follow up office visit in  2 weeks  - if getting worse please go to [place] date: 02/26/2022 Discharge date: 03/02/2022  Discharge to: Home with home health to follow-up with pulmonology and cardiology as well as  PCP Discharge Diagnoses:  Pneumonia of left lower lobe due to infectious organism COPD (chronic obstructive pulmonary disease) (CMS-HCC) Heart failure with reduced ejection fraction (CMS-HCC) History of pulmonary embolus (PE) Diabetes mellitus type 2, noninsulin dependent (CMS-HCC) Dysphagia Resolved Problems: Shortness of breath TIA (transient ischemic attack) Atypical chest pain   Procedures: CT head 7/22 CT abdomen and pelvis, chest 7/22 Chest x-ray 7/22 MRI brain 7/24 V/Q scan 7/24 Venous duplex bilateral lower extremities 7/24  Consults:  Physical Therapy Evaluation and Treatment and Occupational Therapy Evaluation and Treatment, speech therapy Hospital Course:  Gregory Mitchell is a 81 y.o. male that presented to [facility] and was admitted for Pneumonia of left lower lobe due to infectious organism on 02/26/2022 11:22 PM . Patient presented to the ED on 7/22 after being unable to talk for 3 to 4 minutes the evening prior. Could not walk or lift his arms. Mostly normal function was restored after  about 5 minutes. He was also having shortness of breath and wheezing. Uses home oxygen at 3 to 4 L. Was admitted to the hospital for left lower lobe pneumonia, TIA stroke work-up, and heart failure.  The following problems were addressed while the patient was admitted to [facility];  1. Dyspnea/chronic hypoxemic respiratory failure/possible left lower lobe pneumonia, bacterial. Patient was admitted for symptoms of worsening shortness of breath.. Chest x-ray with mild, stable left basilar atelectasis versus infiltrate. Patient was afebrile, does not have leukocytosis. He was already on Levaquin and that was continued, dose adjusted. Ct chest wo contrast with chronic postinflammatory changes in the lungs with bilateral apical scarring, bronchiectasis and chronic consolidation in the lung bases, no significant change from previous study. Aggressive pulmonary toiletry was encouraged throughout admission. He has an appointment to follow-up with his pulmonologist Dr. Melvyn Novas in the next couple of weeks. Encouraged patient's daughter Apolonio Schneiders to make sure he keeps this appointment. 2. COPD with mild exacerbation/underlying bronchiectasis. Patient was recently seen by pulmonology, has history of COPD and bronchiectasis. He was given DuoNebs, Levaquin, IV Solu-Medrol throughout his hospitalization.  3. Abdominal pain. CT abdomen and pelvis without contrast with no acute changes, showed surgical absence of right kidney, left inguinal hernia with no obstruction. Abdominal pain resolved before discharge. Used Colace and MiraLAX to encourage bowel regularity. 4. Chronic combined systolic and diastolic CHF. Patient has history of nonischemic cardiomyopathy. EF is around 20-25%. Continued his home medications of Delene Loll, Farxiga, Lopressor, torsemide. 5. Possible TIA, ruled out stroke. Patient presented with symptoms of decreased sensations and weakness in his both arms and legs. There was some difficulty with  speech as well. CT scan of the head negative. Symptoms were resolved before discharge. MRI of the brain was negative. He was provided with home health PT at discharge. 6. Type 2 diabetes. Continued with diabetic diet. Insulin sliding scale. A1c 6.2. 7. History of PE. Patient apparently was recently taken off of Eliquis by his doctors. D-dimer slightly elevated on admission, VQ scan and lower extremity Dopplers negative for clot. 8. Mild hyperkalemia. Resolved, received lokelma. 4.3-day at discharge. 9. Anemia of chronic disease. Hemoglobin appears to be stable close to baseline throughout admission. 9.1-day of discharge. 10. CKD stage IIIa. Renal function is close to baseline. Continue to monitor closely outpatient. 1.63 creatinine day of discharge. 11. Dysphagia; patient was seen by speech therapy today. She would like to conduct modified barium swallow but agrees to do it outpatient. Central scheduling will set this up.  Patient's testing was negative for  stroke or thrombus. He is very anxious to go home today and tells me that he will leave whether he is discharged or not. He is very ill-appearing with persistent cough but this may be his baseline. Spoke with patient's daughter Apolonio Schneiders by phone extensively and she states he has good support and care at home, lives with his son and daughter-in-law. Apolonio Schneiders also checks on him daily. Encouraged close follow-up with pulmonology and PCP. Ordered modified barium swallow for outpatient. Discharge plan discussed with Dr. Leory Plowman who agrees with the plan. Discharge home with home health. He will continue his home oxygen use.  Discharge Medications:   Your Medication List   STOP taking these medications  albuterol 2.5 mg /3 mL (0.083 %) nebulizer solution  apixaban 5 mg Tab  Commonly known as: ELIQUIS  budesonide-formoteroL 160-4.5 mcg/actuation inhaler Commonly known as: SYMBICORT  DALIRESP 250 mcg Tab Generic drug: roflumilast  esomeprazole 40 MG  capsule Commonly known as: NexIUM  famotidine 40 MG tablet Commonly known as: PEPCID  fluconazole 200 MG tablet Commonly known as: DIFLUCAN  hydrALAZINE 10 MG tablet Commonly known as: APRESOLINE  ipratropium 0.02 % nebulizer solution Commonly known as: ATROVENT  methylPREDNISolone 4 mg tablet Commonly known as: MEDROL DOSEPACK  mirtazapine 15 MG tablet Commonly known as: REMERON  omeprazole 40 MG capsule Commonly known as: PriLOSEC  ondansetron 8 MG disintegrating tablet Commonly known as: ZOFRAN-ODT  prednisoLONE sodium phosphate 5 mg base/5 mL (6.7 mg/5 mL) solution Commonly known as: PEDIAPRED    03/08/2022  f/u ov/[place] office/Yarah Fuente re: GOLD 3  maint on formoterol  but thoroughly confused with meds/ instructoins from [place]  Patient presents with   Follow-up    Patient feels he is about the same since last visit 2 weeks ago  Dyspnea:  walked in with cane x 50 fts  Cough: yellow mucus looks the same on / off levaquin Sleeping: 4lpm and flat bed/ 2 pillows  SABA use: confused  02: 4lpm hs and prn  Covid status: vax x 3      No obvious day to day or daytime variability or assoc excess/ purulent sputum or mucus plugs or hemoptysis or cp or chest tightness, subjective wheeze or overt sinus or hb symptoms.     Also denies any obvious fluctuation of symptoms with weather or environmental changes or other aggravating or alleviating factors except as outlined above   No unusual exposure hx or h/o childhood pna/ asthma or knowledge of premature birth.  Current Allergies, Complete Past Medical History, Past Surgical History, Family History, and Social History were reviewed in [employer] record.  ROS  The following are not active complaints unless bolded Hoarseness, sore throat, dysphagia= globus, dental problems, itching, sneezing,  nasal congestion or discharge of excess mucus or purulent secretions, ear ache,   fever,  chills, sweats, unintended wt loss or wt gain, classically pleuritic or exertional cp,  orthopnea pnd or arm/hand swelling  or leg swelling, presyncope, palpitations, abdominal pain, anorexia, nausea, vomiting, diarrhea  or change in bowel habits or change in bladder habits, change in stools or change in urine, dysuria, hematuria,  rash, arthralgias, visual complaints, headache, numbness, weakness or ataxia or problems with walking or coordination,  change in mood or  memory.        Current Meds  Medication Sig   atorvastatin (LIPITOR) 80 MG tablet Take by mouth.   budesonide (PULMICORT) 0.25 MG/2ML nebulizer solution Take 2 mLs (0.25 mg total) by nebulization  2 (two) times daily.   dapagliflozin propanediol (FARXIGA) 10 MG TABS tablet Take 1 tablet (10 mg total) by mouth daily before breakfast.   diclofenac Sodium (VOLTAREN) 1 % GEL Apply 1 application topically 4 (four) times daily as needed (pain).   ferrous sulfate 325 (65 FE) MG tablet Take 1 tablet (325 mg total) by mouth daily.   HM SENNA-S 8.6-50 MG tablet Take 2 tablets by mouth daily.   HYDROcodone-acetaminophen (NORCO) 7.5-325 MG tablet Take 1 tablet by mouth 2 (two) times daily as needed for moderate pain.   ipratropium-albuterol (DUONEB) 0.5-2.5 (3) MG/3ML SOLN Take 3 mLs by nebulization in the morning, at noon, in the evening, and at bedtime.   lactulose (CHRONULAC) 10 GM/15ML solution Take 30 mLs (20 g total) by mouth 3 (three) times daily as needed for mild constipation.   levofloxacin (LEVAQUIN) 500 MG tablet Take 1 tablet (500 mg total) by mouth daily.   metoprolol succinate (TOPROL XL) 25 MG 24 hr tablet Take 1 tablet (25 mg total) by mouth daily.   OXYGEN Inhale 4 L into the lungs at bedtime as needed (for shortness of breath).   pantoprazole (PROTONIX) 40 MG tablet Take 30- 60 min before your first and last meals of the day   polyethylene glycol-electrolytes (NULYTELY) 420 g solution As directed   predniSONE (DELTASONE) 10 MG  tablet Take 4 for three days 3 for three days 2 for three days 1 for three days and stop   sacubitril-valsartan (ENTRESTO) 49-51 MG Take 1 tablet by mouth 2 (two) times daily.   tamsulosin (FLOMAX) 0.4 MG CAPS capsule Take 0.8 mg by mouth 2 (two) times daily.   torsemide (DEMADEX) 20 MG tablet Take 1 tablet (20 mg total) by mouth daily.                  Past Medical History:  Diagnosis Date   Asthma    Chronic renal insufficiency    COPD (chronic obstructive pulmonary disease) (HCC)    Heart failure ([place])    a. EF 25-30% in 08/2019 with cath showing normal cors b. EF 20-25% in 09/2020 c. 30-35% in 02/2021 d. EF 20-25% in 10/2021   Hypertension    Pulmonary emboli ([place]) 03/2019   Renal insufficiency    Type 2 diabetes mellitus (HCC)        Objective:    Wts  03/08/2022       148   02/24/22 151 lb 3.2 oz (68.6 kg)  01/28/22 148 lb (67.1 kg)  12/18/21 149 lb (67.6 kg)    Vital signs reviewed  03/08/2022  - Note at rest 02 sats  96% on RA   General appearance:    hoarse amb bm not on 02 on arrival / unsteady gait / marked pseudowheeze better with PLM   HEENT : Oropharynx  clear   Nasal turbinates nl    NECK :  without  apparent JVD/ palpable Nodes/TM    LUNGS: no acc muscle use,  Mild barrel  contour chest wall with bilateral  Distant bs s audible wheeze and  without cough on insp or exp maneuvers  and mild  Hyperresonant  to  percussion bilaterally     CV:  RRR  no s3 or murmur or increase in P2, and no edema   ABD:  soft and nontender with pos end  insp Hoover's  in the supine position.  No bruits or organomegaly appreciated   MS:  Nl gait/ ext warm without  deformities Or obvious joint restrictions  calf tenderness, cyanosis or clubbing     SKIN: warm and dry without lesions    NEURO:  alert, approp, nl sensorium with  no motor or cerebellar deficits apparent.      I personally reviewed images and agree with radiology impression as follows:   Chest CT s  contrast 02/27/22 vs 08/05/21 : Mild bilateral apical fibrosis. Bronchial wall  thickening consistent with chronic bronchitis. Focal areas of  consolidation in the lung bases surrounding areas of bronchiectasis.  Scattered fine tree-in-bud infiltrates most prominent in the left  lower lung and lingula. Findings appear similar to previous study.         Assessment

## 2022-03-08 NOTE — Patient Instructions (Addendum)
Protonix (pantoprazole) 40 mg Take 30- 60 min before your first and last meals of the day  - new prescription today     Plan A = Automatic = Always=    Aformoterol 15 mcg 1st thing in am and 12 hours and budsonide 0.25 twice daily   Plan B = Backup (to supplement plan A, not to replace it) Only use your albuterol nebulizer  as a rescue medication to be used if you can't catch your breath by resting or doing a relaxed purse lip breathing pattern.  - The less you use it, the better it will work when you need it. - Ok to use the nebulizer up to  4 hours if you must but call for appointment if use goes up over your usual need - Think of it like the starter fluid for your car engine     GERD (REFLUX)  is an extremely common cause of respiratory symptoms just like yours , many times with no obvious heartburn at all.    It can be treated with medication, but also with lifestyle changes including elevation of the head of your bed (ideally with 6 -8inch blocks under the headboard of your bed),  Smoking cessation, avoidance of late meals, excessive alcohol, and avoid fatty foods, chocolate, peppermint, colas, red wine, and acidic juices such as orange juice.  NO MINT OR MENTHOL PRODUCTS SO NO COUGH DROPS  USE SUGARLESS CANDY INSTEAD (Jolley ranchers or Stover's or Life Savers) or even ice chips will also do - the key is to swallow to prevent all throat clearing. NO OIL BASED VITAMINS - use powdered substitutes.  Avoid fish oil when coughing.    Stop entresto   Start valsartan   For cough > mucinex dm 1200 mg every 12 hours and use flutter valve as much as possible    Please schedule a follow up office visit in 4 weeks, sooner if needed  with all medications /inhalers/ solutions/ flutter valve in hand so we can verify exactly what you are taking. This includes all medications from all doctors and over the counters

## 2022-03-08 NOTE — Assessment & Plan Note (Signed)
D/c entresto trial basis and replace with valsartan 80 mg bid (as on 51 mg bid in entresto)   The sacubitrilat component of entresto is not and ACEi but it does lead to higher levels of bradykinin (the culprit in ACEi related cough) because it reduces Neprilysin based clearance of bradykinin. The typical symptoms are dry daytime cough (9% per PI) or complaints of a new sensation of globus or excess PNDS which have been chronic complaints for him and have progressed to refractory pseudowheeze  F/u in 2 weeks with all meds in hand using a trust but verify approach to confirm accurate Medication  Reconciliation The principal here is that until we are certain that the  patients are doing what we've asked, it makes no sense to ask them to do more.    Each maintenance medication was reviewed in detail including emphasizing most importantly the difference between maintenance and prns and under what circumstances the prns are to be triggered using an action plan format where appropriate.  Total time for H and P, chart review, counseling, reviewing neb/02 device(s) , directly observing portions of ambulatory 02 saturation study/ and generating customized AVS unique to this office visit / same day charting > 40 min for multiple  refractory respiratory  symptoms of uncertain etiology s/p admit

## 2022-03-08 NOTE — Assessment & Plan Note (Signed)
Placed on 02 around 2021  - 88% p walking 100 ft 01/28/2022 > rec 3lpm and titrate daytime to keep > 90% at all times as has chf  - 03/08/2022   Walked on RA   x  3  lap(s) =  approx 450  ft  @ moderately slow/cane pace, stopped due to end of study with lowest 02 sats 91% and sob p 1st lap but never stopped    rec continue 02 4lpm hs and prn daytime with goal > 90%

## 2022-03-08 NOTE — Assessment & Plan Note (Signed)
Quit smoking 1978 PFT 11/16/13   FEV1 37%, ratio 61 , no sign BD response Decreased FVC 45% (19% BD response) . DLCO was unable to be done.  - pulmonary eval 01/28/2022 severe coughing on trelegy and entresto rec duoneb/flutter/mucinex dm and stop trelegy  Then ? Trial off entresto and on just valsartan?  - Allergy screen 01/28/22  >  Eos 0.2 /  IgE  7 - 02/24/2022 changed to bud/perfomist bid with prn duoneb/ 12 d pred taper and levquin 500 x 7days with f/u  q 2 weeks and strongly consider d/c entresto but EF only 25% - 02/24/2022  After extensive coaching inhaler device,  effectiveness =    0% with hfa  - 03/08/2022 d/c entresto (pseudowheeze) and f/u in 2 weeks   He has clearly both upper and lower lobe findings but not responding to typical max rx for copd so rec try off entresto and f/u q 2 weeks

## 2022-03-09 ENCOUNTER — Encounter: Payer: Self-pay | Admitting: Gastroenterology

## 2022-03-09 ENCOUNTER — Telehealth: Payer: Self-pay | Admitting: *Deleted

## 2022-03-09 ENCOUNTER — Telehealth (INDEPENDENT_AMBULATORY_CARE_PROVIDER_SITE_OTHER): Payer: Medicare HMO | Admitting: Gastroenterology

## 2022-03-09 VITALS — Ht 64.5 in | Wt 148.0 lb

## 2022-03-09 DIAGNOSIS — D509 Iron deficiency anemia, unspecified: Secondary | ICD-10-CM | POA: Diagnosis not present

## 2022-03-09 DIAGNOSIS — R1013 Epigastric pain: Secondary | ICD-10-CM

## 2022-03-09 DIAGNOSIS — K5900 Constipation, unspecified: Secondary | ICD-10-CM

## 2022-03-09 DIAGNOSIS — K219 Gastro-esophageal reflux disease without esophagitis: Secondary | ICD-10-CM

## 2022-03-09 DIAGNOSIS — R1319 Other dysphagia: Secondary | ICD-10-CM

## 2022-03-09 NOTE — Patient Instructions (Addendum)
Stop Senna. Restart lactulose 20 grams three times daily as needed for constipation. We will reach out to Dr. Melvyn Novas regarding clearance from pulmonary standpoint for upper endoscopy and colonoscopy. If he feels like it is safe to pursue procedures, we will get you scheduled for a date later this month (after you see cardiology on 8/16).  Continue pantoprazole '40mg'$  twice daily before a meal.  Call if you have any questions or concerns.

## 2022-03-09 NOTE — Progress Notes (Signed)
Primary Care Physician:  Beola Cord, FNP Primary GI:  Elon Alas. Abbey Chatters, DO  Patient Location: Home  Provider Location: [place] office  Reason for Visit:  Chief Complaint  Patient presents with   Colonoscopy   Persons present on the virtual encounter, with roles: Patient, myself (provider), patient's dgt, [name] CMA (updated meds and allergies)  Total time (minutes) spent on medical discussion: 16 minutes  Due to COVID-19, visit was conducted using Mychart video method.  Visit was requested by patient.  Virtual Visit via Mychart video  I connected with Gregory Mitchell on 03/09/22 at 11:30 AM EDT by mychart video and verified that I am speaking with the correct person using two identifiers.   I discussed the limitations, risks, security and privacy concerns of performing an evaluation and management service by telephone/video and the availability of in person appointments. I also discussed with the patient that there may be a patient responsible charge related to this service. The patient expressed understanding and agreed to proceed.   HPI: Gregory Mitchell is a 81 y.o. male who presents for virtual visit regarding need to schedule colonoscopy and upper endoscopy. Patient last seen in 11/2021. We had plans for EGD with fungal culture and sensitivities for recurrent dysphagia/candida esophagitis and TCS for IDA but work up delayed by cardiopulmonary issues/work up.   Patient had been admitted to hospital back in 08/2021 with acute blood loss anemia, melena, IDA. EGD and flex sig as outlined below. H/o recurrent Candida esophagitis requiring multiple rounds of Diflucan.   Patient has issues with constipation and dysphagia. Swallowing improved after taking Diflucan the last time in 08/2021 but symptoms returned. He has to wash his food down with plenty of liquids. Large pills get stuck. Denies odynophagia. Since 08/2021 he has had more than 3 rounds of antibiotics and 2 rounds  of steroids. He struggles with constipation chronically. He has tried sennosides 8.'6mg'$  bid with no relief. Regular miralax not helpful. Lactulose seemed to help but he is out. Was having BM every other day. Now back on senna two daily but not as effective.. Stools loose when he goes. No melena, brbpr. No abd pain. Complains of intermittent central abdominal pain just above his belt. Not sure what affects it.    Saw pulmonology recently. Entresto stopped and valsartan 53m bid started due to "chest pain" per dgt. So far no further chest pain but has been only one day since medication stopped. Patient states he sleeps well. Props up on 2 pillows and also has bricks under head of bed. Uses 4 liters of oxygen at night. Complains of DOE. Has follow up with Dr. WMelvyn Novas(pulmonologist) end of August and Cardiology 03/24/22. Daughter wants to wait until after these appointments to have procedures but in agreement to go ahead with a date pending final clearance from pulmonary. Cardiac clearance already provided but cautions regarding respiratory issues. [facility] Imaging negative for PE/DVT/CVA. CT showed chronic postinflammatory changes in the lungs with bilateral apical scarring, bronchiectasis and bronchial wall thickening, and chronic consolidation in lung bases.    Last EGD:08/24/21- Moderately severe candidiasis esophagitis with no bleeding. Cells for cytology obtained. - Gastritis. Biopsied. - Normal duodenal bulb, first portion of the duodenum and second portion of the duodenum.   Flex sig: 08/24/21- Sacral decubitus ulcer found on perianal exam. - The recto-sigmoid colon, sigmoid colon and descending colon are normal. - No specimens collected.  Colonoscopy March 2008: normal,  no specimens      Current Outpatient Medications  Medication Sig Dispense Refill   albuterol (PROVENTIL) (2.5 MG/3ML) 0.083% nebulizer solution 2.5 mg every 4 hours if needed      atorvastatin (LIPITOR) 80 MG tablet Take by mouth.     budesonide (PULMICORT) 0.25 MG/2ML nebulizer solution Take 2 mLs (0.25 mg total) by nebulization 2 (two) times daily. 60 mL 12   dapagliflozin propanediol (FARXIGA) 10 MG TABS tablet Take 1 tablet (10 mg total) by mouth daily before breakfast. 100 tablet 3   diclofenac Sodium (VOLTAREN) 1 % GEL Apply 1 application topically 4 (four) times daily as needed (pain).     ferrous sulfate 325 (65 FE) MG tablet Take 1 tablet (325 mg total) by mouth daily. 30 tablet 3   HM SENNA-S 8.6-50 MG tablet Take 2 tablets by mouth daily.     HYDROcodone-acetaminophen (NORCO) 7.5-325 MG tablet Take 1 tablet by mouth 2 (two) times daily as needed for moderate pain.     metoprolol succinate (TOPROL XL) 25 MG 24 hr tablet Take 1 tablet (25 mg total) by mouth daily. 90 tablet 3   OXYGEN Inhale 4 L into the lungs at bedtime as needed (for shortness of breath).     pantoprazole (PROTONIX) 40 MG tablet Take 30- 60 min before your first and last meals of the day 60 tablet 11   tamsulosin (FLOMAX) 0.4 MG CAPS capsule Take 0.8 mg by mouth 2 (two) times daily.     torsemide (DEMADEX) 20 MG tablet Take 1 tablet (20 mg total) by mouth daily. 60 tablet 6   valsartan (DIOVAN) 80 MG tablet One twice daily 60 tablet 2   No current facility-administered medications for this visit.    Past Medical History:  Diagnosis Date   Asthma    Chronic renal insufficiency    COPD (chronic obstructive pulmonary disease) (HCC)    Heart failure ([place])    a. EF 25-30% in 08/2019 with cath showing normal cors b. EF 20-25% in 09/2020 c. 30-35% in 02/2021 d. EF 20-25% in 10/2021   Hypertension    Pulmonary emboli ([place]) 03/2019   Renal insufficiency    Type 2 diabetes mellitus ([place])     Past Surgical History:  Procedure Laterality Date   APPENDECTOMY     BALLOON DILATION N/A 03/18/2020   Procedure: BALLOON DILATION;  Surgeon: Eloise Harman, DO;  Location: [facility];  Service:  Endoscopy;  Laterality: N/A;   CHOLECYSTECTOMY     COLONOSCOPY  10/2006   Dr.Anwar:normal   ESOPHAGEAL BRUSHING  08/24/2021   Procedure: ESOPHAGEAL BRUSHING;  Surgeon: Eloise Harman, DO;  Location: [facility];  Service: Endoscopy;;   ESOPHAGOGASTRODUODENOSCOPY (EGD) WITH PROPOFOL N/A 03/18/2020   Procedure: ESOPHAGOGASTRODUODENOSCOPY (EGD) WITH PROPOFOL;  Surgeon: Eloise Harman, DO;  [facility]: [facility];  Service: Endoscopy;  Laterality: N/A;  2:30pm   ESOPHAGOGASTRODUODENOSCOPY (EGD) WITH PROPOFOL N/A 08/24/2021   Procedure: ESOPHAGOGASTRODUODENOSCOPY (EGD) WITH PROPOFOL;  Surgeon: Eloise Harman, DO;  [facility]: [facility];  Service: Endoscopy;  Laterality: N/A;  with dilation   FLEXIBLE SIGMOIDOSCOPY N/A 08/24/2021   Procedure: FLEXIBLE SIGMOIDOSCOPY;  Surgeon: Eloise Harman, DO;  [facility]: [facility];  Service: Endoscopy;  Laterality: N/A;   NASAL SINUS SURGERY     NEPHRECTOMY     RIGHT (Question CA.  No further therapy needed.)   RIGHT/LEFT HEART CATH AND CORONARY ANGIOGRAPHY N/A 09/03/2019   Procedure: RIGHT/LEFT  HEART CATH AND CORONARY ANGIOGRAPHY;  Surgeon: Lorretta Harp, MD;  Location: [facility];  Service: Cardiovascular;  Laterality: N/A;    Family History  Problem Relation Age of Onset   Stroke Mother    Breast cancer Mother    Lung cancer Brother    Colon cancer Neg Hx     Social History   Socioeconomic History   Marital status: Widowed    Spouse name: Not on file   Number of children: Not on file   Years of education: Not on file   Highest education level: Not on file  Occupational History   Not on file  Tobacco Use   Smoking status: Former    Packs/day: 1.00    Years: 15.00    Total pack years: 15.00    Types: Cigarettes    Start date: 07/14/1957    Quit date: 08/09/1976    Years since quitting: 45.6   Smokeless tobacco: Former    Types: Chew    Quit date: 02/10/2006   Tobacco comments:    chewed tobacco for 10 years   Vaping Use   Vaping Use: Never used  Substance and Sexual Activity   Alcohol use: Not Currently    Alcohol/week: 0.0 standard drinks of alcohol    Comment: no etoh since he was in his 51s   Drug use: Never   Sexual activity: Yes  Other Topics Concern   Not on file  Social History Narrative   Lives with wife.  Three children.     Social Determinants of Health   Financial Resource Strain: Not on file  Food Insecurity: Not on file  Transportation Needs: Not on file  Physical Activity: Not on file  Stress: Not on file  Social Connections: Not on file  Intimate Partner Violence: Not on file      ROS:  General: Negative for anorexia, weight loss, fever, chills, fatigue, +weakness. Eyes: Negative for vision changes.  ENT: Negative for hoarseness,  nasal congestion. See hpi CV: Negative for chest pain, angina, palpitations, +dyspnea on exertion, peripheral edema. See hpi Respiratory: Negative for dyspnea at rest, +dyspnea on exertion, +cough, sputum, wheezing.  GI: See history of present illness. GU:  Negative for dysuria, hematuria, urinary incontinence, urinary frequency, nocturnal urination.  MS: Negative for joint pain, low back pain.  Derm: Negative for rash or itching.  Neuro: Negative for weakness, abnormal sensation, seizure, frequent headaches, memory loss, confusion.  Psych: Negative for anxiety, depression, suicidal ideation, hallucinations.  Endo: Negative for unusual weight change.  Heme: Negative for bruising or bleeding. Allergy: Negative for rash or hives.   Observations/Objective: Breathing comfortably. Able to answer complete sentences without sob. Nad. Abdomen obese.   03/01/22: WBC 6100, H/H 9.1/31.6, MCV 95.2, potassium 5.1, creatinine 1.66, tbili 0.2, AP 77, AST 13, ALT 8.   Lab Results  Component Value Date   CREATININE 1.34 (H) 01/28/2022   BUN 14 01/28/2022   NA 141 01/28/2022   K 5.3 (H) 01/28/2022   CL 105 01/28/2022   CO2 24 01/28/2022    Lab Results  Component Value Date   WBC 5.2 01/28/2022   HGB 9.4 (L) 01/28/2022   HCT 30.9 (L) 01/28/2022   MCV 90 01/28/2022   PLT 290 01/28/2022   Lab Results  Component Value Date   ALT 6 09/22/2021   AST 12 (L) 09/22/2021   ALKPHOS 54 09/22/2021   BILITOT 0.3 09/22/2021   Lab Results  Component Value Date  INR 1.1 08/23/2021   INR 1.0 08/22/2021   INR 1.0 02/20/2020   Lab Results  Component Value Date   IRON 38 01/01/2022   TIBC 290 01/01/2022   FERRITIN 99 01/01/2022   Lab Results  Component Value Date   [id number] 09/22/2021   Lab Results  Component Value Date   FOLATE 7.6 09/22/2021     Assessment and Plan:  GERD/epigastric pain: typical reflux well controlled. Complains of vague intermittent epigastric pain.  Dysphagia: history of recurrent candida esophagitis and reports previously dysphagia improves after Diflucan treatment. Recurrent symptoms several months back. Planned for EGD with fungal culture and sensitivities if evidence of recurrent candida esophagitis +/- esophageal dilation. Work up has been delayed by cardiopulmonary issues. Recent cardiac clearance obtained but pending pulmonary clearance as outlined above.   SLH:[id number] earlier this year with acute blood loss anemia/melena. EGD as outlined. Hgb has been stable. Flex sig completed earlier in the year for abnormal rectum on CT. Colonoscopy as planned.   Constipation: resume lactulose.  Resume lactulose 30cc up to 3 times daily as needed to maintain soft regular stool. Continue pantoprazole '40mg'$  bid before a meal. Colonoscopy/EGD+/-ED with Dr. Abbey Chatters once received pulmonary clearance. Cardiac clearance in the chart. ASA 4.  I have discussed the risks, alternatives, benefits with regards to but not limited to the risk of reaction to medication, bleeding, infection, perforation and the patient is agreeable to proceed. Written consent to be obtained. Hold iron 7 days if TCS. Day of before  procedure (s): farxiga 1/2 tablet am AM of procedures: hold farxiga 2 days clear liquids Dulcolax '10mg'$  daily for 3 days before prep for TCS. Trilyte split the day before.  Tap water enemas not fleets.  Follow Up Instructions:    I discussed the assessment and treatment plan with the patient. The patient was provided an opportunity to ask questions and all were answered. The patient agreed with the plan and demonstrated an understanding of the instructions. AVS mailed to patient's home address.   The patient was advised to call back or seek an in-person evaluation if the symptoms worsen or if the condition fails to improve as anticipated.  I provided 16 minutes of virtual face-to-face time during this encounter.   Neil Crouch, PA-C

## 2022-03-09 NOTE — Telephone Encounter (Signed)
  What type of surgery is being performed? Colonoscopy/EGD  When is surgery scheduled? TBD  What type of clearance is required (medical or pharmacy to hold medication or both? Medical Clearance  Name of physician performing surgery?  Dr. [facility]ina [facility] Phone: [phone] Fax: [phone]  Anethesia type (none, local, MAC, general)? MAC

## 2022-03-13 MED ORDER — LACTULOSE 10 GM/15ML PO SOLN
20.0000 g | Freq: Three times a day (TID) | ORAL | 3 refills | Status: DC | PRN
Start: 2022-03-13 — End: 2022-07-17

## 2022-03-15 NOTE — Telephone Encounter (Signed)
Tanda Rockers, MD 8 minutes ago (4:30 PM)    Will need to wait until next ov to clear for  colon/egd      Patient has an appt with Dr. Melvyn Novas on 04/08/2022.

## 2022-03-15 NOTE — Telephone Encounter (Addendum)
Will need to wait until next ov to clear for  colon/egd

## 2022-03-17 NOTE — Telephone Encounter (Signed)
Please let pt's daughter know.

## 2022-03-18 NOTE — Telephone Encounter (Signed)
Pt's daughter was made aware

## 2022-03-23 ENCOUNTER — Ambulatory Visit: Payer: Medicare HMO | Admitting: Cardiology

## 2022-03-23 ENCOUNTER — Encounter: Payer: Self-pay | Admitting: Cardiology

## 2022-03-23 VITALS — BP 110/55 | HR 89 | Ht 66.0 in | Wt 139.2 lb

## 2022-03-23 DIAGNOSIS — Z01810 Encounter for preprocedural cardiovascular examination: Secondary | ICD-10-CM

## 2022-03-23 DIAGNOSIS — I5022 Chronic systolic (congestive) heart failure: Secondary | ICD-10-CM

## 2022-03-23 DIAGNOSIS — I10 Essential (primary) hypertension: Secondary | ICD-10-CM | POA: Diagnosis not present

## 2022-03-23 DIAGNOSIS — Z79899 Other long term (current) drug therapy: Secondary | ICD-10-CM

## 2022-03-23 DIAGNOSIS — R0789 Other chest pain: Secondary | ICD-10-CM

## 2022-03-23 MED ORDER — TORSEMIDE 20 MG PO TABS: 20.0000 mg | ORAL_TABLET | ORAL | Status: DC | PRN

## 2022-03-23 NOTE — Progress Notes (Signed)
Clinical Summary Gregory Mitchell is a 81 y.o.male seen today for follow up of the following medical problems.   1. Chronic systolic HF - LVEF previously as low as 35-40%, repeat echo showed improvement to 45-50% in 04/2013. He has had a prior cath 11/2011 that showed non-obstructive disease  - 05/2016 echo LVEF 20-25%, grade I diastolic dysfunction   - Jan 2021 admission with volume overload - Jan 2021 echo LVEF 25-30%, grade II DDX, ,normal RV, mild MR, - Jan 2021 cath: normal coronaries. CI 3, mean PA 17, PCWP 17, LVEDP 13    09/2020 echo LVEF 20-25%, grade III dd, normal RV    -followed in CHF clinic, though difficult transportation and will f/u with them as needed        02/2021 echo LVEF 30-35%, mild RV dysfunction CHF clnic had recommended cMRI   - recent increase in LE edema.  - weights hard to trend, had lost a significant amount of wait this year due to failure to thrive. In 10/18/19 he was 158 lbs. At last cards visit 12/30/20 was 128 lbs - 03/02/21 wt was 145 lbs - recent LE edema. Taking lasix '40mg'$  bid, with mild improvement    - some recent leg swelling - taking torsemide '20mg'$  prn, takes roughly 4-5 times per week.  - home weight 145 lbs and stable -pulmony had suggested ARB over entresto due to COPD, now off.  - bp to day 92/50.         2. COPD - tobacco x 20 years, quit 40 years ago.    - followed by Dr Koleen Nimrod. - 03/02/2021 seen by [facility]   02/16/2021 ER visit, CXR possible pneumonia. Started on steroids and abx  -typically has chest pain in setting of his lung disease exacerbations 02/2021 CXR patchy bibasilar airspace opacities, atelectasis vs pneumonia     3. HTN - compliant with meds     4. HL - muscle aches on statin prevoiusly  - currently diet controlled     5. 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 By history appears unprovoked.    - off eliquis due anemia - 4/4 Hgb 8.7,  down from 11.5. Stopped by pcp       6. Epigastric pain/Chest pain - long history of atypical chest pain - cath 2013 2021 with nonobstructive disease. Stress tests 2016 and 2017 no ischemia -often in the past have resovled with treatment of his COPD with nebs and steroids - also with prior candida esophagitis by EGD, symptoms improved with diflucan. GI is trying a repeat course.    - tightness midchest. Can occur at rest or with activity. 10/10 in severity. Not positional. Can last 2-3 hours, can last all day. Worst with eating.  - better with fluid pill. Can be better with nebulizer   7.Preop - considering GI endoscopy  Past Medical History:  Diagnosis Date   Asthma    Chronic renal insufficiency    COPD (chronic obstructive pulmonary disease) (HCC)    Heart failure ([place])    a. EF 25-30% in 08/2019 with cath showing normal cors b. EF 20-25% in 09/2020 c. 30-35% in 02/2021 d. EF 20-25% in 10/2021   Hypertension    Pulmonary emboli ([place]) 03/2019   Renal insufficiency    Type 2 diabetes mellitus (HCC)      No Known Allergies    Current Outpatient Medications  Medication Sig Dispense Refill  albuterol (PROVENTIL) (2.5 MG/3ML) 0.083% nebulizer solution 2.5 mg every 4 hours if needed     atorvastatin (LIPITOR) 80 MG tablet Take by mouth.     budesonide (PULMICORT) 0.25 MG/2ML nebulizer solution Take 2 mLs (0.25 mg total) by nebulization 2 (two) times daily. 60 mL 12   dapagliflozin propanediol (FARXIGA) 10 MG TABS tablet Take 1 tablet (10 mg total) by mouth daily before breakfast. 100 tablet 3   diclofenac Sodium (VOLTAREN) 1 % GEL Apply 1 application topically 4 (four) times daily as needed (pain).     ferrous sulfate 325 (65 FE) MG tablet Take 1 tablet (325 mg total) by mouth daily. 30 tablet 3   HM SENNA-S 8.6-50 MG tablet Take 2 tablets by mouth daily.     HYDROcodone-acetaminophen (NORCO) 7.5-325 MG tablet Take 1 tablet by mouth 2 (two) times daily as needed for moderate  pain.     lactulose (CHRONULAC) 10 GM/15ML solution Take 30 mLs (20 g total) by mouth 3 (three) times daily as needed for mild constipation. 1800 mL 3   metoprolol succinate (TOPROL XL) 25 MG 24 hr tablet Take 1 tablet (25 mg total) by mouth daily. 90 tablet 3   OXYGEN Inhale 4 L into the lungs at bedtime as needed (for shortness of breath).     pantoprazole (PROTONIX) 40 MG tablet Take 30- 60 min before your first and last meals of the day 60 tablet 11   tamsulosin (FLOMAX) 0.4 MG CAPS capsule Take 0.8 mg by mouth 2 (two) times daily.     torsemide (DEMADEX) 20 MG tablet Take 1 tablet (20 mg total) by mouth daily. 60 tablet 6   valsartan (DIOVAN) 80 MG tablet One twice daily 60 tablet 2   No current facility-administered medications for this visit.     Past Surgical History:  Procedure Laterality Date   APPENDECTOMY     BALLOON DILATION N/A 03/18/2020   Procedure: BALLOON DILATION;  Surgeon: Eloise Harman, DO;  Location: [facility];  Service: Endoscopy;  Laterality: N/A;   CHOLECYSTECTOMY     COLONOSCOPY  10/2006   Dr.Anwar:normal   ESOPHAGEAL BRUSHING  08/24/2021   Procedure: ESOPHAGEAL BRUSHING;  Surgeon: Eloise Harman, DO;  Location: [facility];  Service: Endoscopy;;   ESOPHAGOGASTRODUODENOSCOPY (EGD) WITH PROPOFOL N/A 03/18/2020   Procedure: ESOPHAGOGASTRODUODENOSCOPY (EGD) WITH PROPOFOL;  Surgeon: Eloise Harman, DO;  Location: [facility];  Service: Endoscopy;  Laterality: N/A;  2:30pm   ESOPHAGOGASTRODUODENOSCOPY (EGD) WITH PROPOFOL N/A 08/24/2021   Procedure: ESOPHAGOGASTRODUODENOSCOPY (EGD) WITH PROPOFOL;  Surgeon: Eloise Harman, DO;  Location: [facility];  Service: Endoscopy;  Laterality: N/A;  with dilation   FLEXIBLE SIGMOIDOSCOPY N/A 08/24/2021   Procedure: FLEXIBLE SIGMOIDOSCOPY;  Surgeon: Eloise Harman, DO;  Location: [facility];  Service: Endoscopy;  Laterality: N/A;   NASAL SINUS SURGERY     NEPHRECTOMY     RIGHT (Question CA.  No further  therapy needed.)   RIGHT/LEFT HEART CATH AND CORONARY ANGIOGRAPHY N/A 09/03/2019   Procedure: RIGHT/LEFT HEART CATH AND CORONARY ANGIOGRAPHY;  Surgeon: Lorretta Harp, MD;  Location: [facility];  Service: Cardiovascular;  Laterality: N/A;     No Known Allergies     Family History  Problem Relation Age of Onset   Stroke Mother    Breast cancer Mother    Lung cancer Brother    Colon cancer Neg Hx      Social History Mr. Capraro reports that he  quit smoking about 45 years ago. His smoking use included cigarettes. He started smoking about 64 years ago. He has a 15.00 pack-year smoking history. He quit smokeless tobacco use about 16 years ago.  His smokeless tobacco use included chew. Mr. Sellitto reports that he does not currently use alcohol.   Review of Systems CONSTITUTIONAL: No weight loss, fever, chills, weakness or fatigue.  HEENT: Eyes: No visual loss, blurred vision, double vision or yellow sclerae.No hearing loss, sneezing, congestion, runny nose or sore throat.  SKIN: No rash or itching.  CARDIOVASCULAR:  RESPIRATORY: No shortness of breath, cough or sputum.  GASTROINTESTINAL: No anorexia, nausea, vomiting or diarrhea. No abdominal pain or blood.  GENITOURINARY: No burning on urination, no polyuria NEUROLOGICAL: No headache, dizziness, syncope, paralysis, ataxia, numbness or tingling in the extremities. No change in bowel or bladder control.  MUSCULOSKELETAL: No muscle, back pain, joint pain or stiffness.  LYMPHATICS: No enlarged nodes. No history of splenectomy.  PSYCHIATRIC: No history of depression or anxiety.  ENDOCRINOLOGIC: No reports of sweating, cold or heat intolerance. No polyuria or polydipsia.  [name]   Physical Examination Vitals:   03/23/22 1341 03/23/22 1414  BP: (!) 92/50 (!) 110/55  Pulse: 89   SpO2: 93%    Filed Weights   03/23/22 1341  Weight: 139 lb 3.2 oz (63.1 kg)    Gen: resting comfortably, no acute distress HEENT: no scleral  icterus, pupils equal round and reactive, no palptable cervical adenopathy,  CV: per hpi Resp: per hpi GI: abdomen is soft, non-tender, non-distended, normal bowel sounds, no hepatosplenomegaly MSK: extremities are warm, 1+ bilateral LE edema Skin: warm, no rash Neuro:  no focal deficits Psych: appropriate affect   Diagnostic Studies  04/2013 echo Study Conclusions  - Left ventricle: The cavity size was mildly dilated. Wall   thickness was increased in a pattern of mild to   moderateLVH. Systolic function was mildly reduced. The   estimated ejection fraction was in the range of 45% to   50%. There is mild global hypokinesis. There was an   increased relative contribution of atrial contraction to   ventricular filling. Doppler parameters are consistent   with abnormal left ventricular relaxation (grade 1   diastolic dysfunction). Doppler parameters are consistent   with high ventricular filling pressure. - Mitral valve: Calcified annulus. Mildly thickened leaflets   . Trivial to mild regurgitation. - Left atrium: The atrium was mildly dilated. - Right ventricle: The cavity size was mildly dilated. Wall   thickness was normal. Systolic function was mildly   reduced. - Right atrium: The atrium was mildly dilated. - Pericardium, extracardiac: A trivial pericardial effusion   was identified.     11/2011 Cath Procedural Findings:               Hemodynamics:                                       AO 157/77                                     LV 154/22              Coronary angiography:   Coronary dominance: Right   Left mainstem:   Normal   Left anterior descending (LAD):  Large vessel wrapping the apex.  Mild lumina irregularities.  D1 small normal.  D2 small normal.   Left circumflex (LCx):  AV group luminal irregularities.  OM1 large and normal.  PL x 2 small normal   Right coronary artery (RCA):  Dominant.  Long mid 25%.  PDA normal.  PL moderate and normal.       Left ventriculography:  Left ventricle not injected   Final Conclusions:  Mild coronary plaque.  Non ischemic cardiomyopathy   Recommendations:   Medical management.          12/2014 Exercise Cardiolite IMPRESSION: 1. Moderate size region of inferior wall myocardial scar. No ischemic territories seen. Overlying soft tissue attenuation artifact cannot entirely be ruled out.   2. Mild inferior wall hypokinesis.   3. Left ventricular ejection fraction 48%   4.  Low to intermediate-risk stress test findings*.     05/2016 echo Study Conclusions   - Left ventricle: The cavity size was normal. Wall thickness was   increased in a pattern of mild LVH. Systolic function was normal.   The estimated ejection fraction was in the range of 55% to 60%.   Wall motion was normal; there were no regional wall motion   abnormalities. Doppler parameters are consistent with abnormal   left ventricular relaxation (grade 1 diastolic dysfunction). - Aortic valve: Mildly calcified annulus. Trileaflet; mildly   thickened leaflets. Valve area (VTI): 4.03 cm^2. Valve area   (Vmax): 4.25 cm^2. Valve area (Vmean): 4.49 cm^2. - Left atrium: The atrium was mildly to moderately dilated. - Atrial septum: No defect or patent foramen ovale was identified. - Technically adequate study.     Jan 2021 echo   IMPRESSIONS     1. Left ventricular ejection fraction, by visual estimation, is 25 to  30%. The left ventricle has severely decreased function. There is no left  ventricular hypertrophy.   2. Elevated left ventricular end-diastolic pressure.   3. Left ventricular diastolic parameters are consistent with Grade II  diastolic dysfunction (pseudonormalization).   4. Mild to moderately dilated left ventricular internal cavity size.   5. The left ventricle demonstrates global hypokinesis.   6. Global right ventricle has normal systolic function.The right  ventricular size is normal. Mildly increased right  ventricular wall  thickness.   7. Left atrial size was severely dilated.   8. Right atrial size was moderately dilated.   9. The mitral valve is grossly normal. Mild mitral valve regurgitation.  10. The tricuspid valve is grossly normal.  11. The tricuspid valve is grossly normal. Tricuspid valve regurgitation  is mild.  12. The aortic valve is tricuspid. Aortic valve regurgitation is not  visualized. No evidence of aortic valve sclerosis or stenosis.  13. The pulmonic valve was grossly normal. Pulmonic valve regurgitation is  not visualized.  14. The inferior vena cava is normal in size with greater than 50%  respiratory variability, suggesting right atrial pressure of 3 mmHg.  Jan 2021 cath History obtained from chart review.AKHILESH SASSONE is a 81 y.o. male with a hx of longstanding chest pain with nonobstructive coronary artery disease by cardiac catheterization in 7096, chronic systolic heart failure with evidence of improved LV function in the past (secondary to nonischemic cardiomyopathy), COPD, hypertension, hyperlipidemia, prior pulmonary embolism, chronic kidney disease and diabetes who is being seen today for the evaluation of decompensated heart failure in the setting of reduced LV function and elevated troponin at the request of Dr. Dyann Kief.     IMPRESSION: Mr. Nienhuis has normal coronary  arteries and low filling pressures suggesting that he has been adequately or over diuresed.  He did have a high V waves suggesting that he potentially has significant mitral regurgitation.  His systolic pressures were in the mid to high 90s and I therefore gave him a 250 cc bolus of saline given his low LVEDP and wedge.  He will need guideline directed optimal medical therapy for his LV dysfunction.  The sheath removed and a TR band was placed on the right wrist to achieve patent hemostasis.  The patient left lab in stable condition.  His renal function be carefully monitored.  Dr. Davina Poke was notified  of these results.     09/2020 echo IMPRESSIONS     1. Left ventricular ejection fraction, by estimation, is 20 to 25%. The  left ventricle has severely decreased function. The left ventricle  demonstrates global hypokinesis. The left ventricular internal cavity size  was severely dilated. Left ventricular  diastolic parameters are consistent with Grade III diastolic dysfunction  (restrictive).   2. Right ventricular systolic function is normal. The right ventricular  size is normal. There is normal pulmonary artery systolic pressure.   3. Left atrial size was mildly dilated.   4. The mitral valve is normal in structure. Trivial mitral valve  regurgitation. No evidence of mitral stenosis.   5. The aortic valve is tricuspid. Aortic valve regurgitation is not  visualized. No aortic stenosis is present.   6. The inferior vena cava is normal in size with greater than 50%  respiratory variability, suggesting right atrial pressure of 3 mmHg.    02/2021 echo IMPRESSIONS     1. Diffuse hypokinesis with abnormal septal motion . Left ventricular  ejection fraction, by estimation, is 30 to 35%. The left ventricle has  moderately decreased function. The left ventricle demonstrates global  hypokinesis. The left ventricular internal   cavity size was moderately to severely dilated. Left ventricular  diastolic parameters were normal. The average left ventricular global  longitudinal strain is -13.3 %. The global longitudinal strain is  abnormal.   2. Right ventricular systolic function is mildly reduced. The right  ventricular size is normal.   3. Right atrial size was mildly dilated.   4. The mitral valve is normal in structure. Trivial mitral valve  regurgitation. No evidence of mitral stenosis.   5. The aortic valve is normal in structure. Aortic valve regurgitation is  not visualized. No aortic stenosis is present.   6. The inferior vena cava is normal in size with greater than 50%   respiratory variability, suggesting right atrial pressure of 3 mmHg.      Assessment and Plan   Chronic systolic HF - some ongoing edema - has only been taking his torsemide prn. Will take next 3 days then resume prn. Check bmet/mg/bnp, pending result smay have him start taking torsemide daily - entresto changed to valsartan after talks with pulmonary about his COPD - soft bp's, not on aldactone currently   2. Chest pain - long history atypical symptoms, cath 2021 without disease - symptoms improve with diuretic, will increase usage. Also worst with food, he has up coming endoscopy  3. Preop eval - ok for GI procedures from cardiac standpoint.     Arnoldo Lenis, M.D.

## 2022-03-23 NOTE — Patient Instructions (Signed)
Medication Instructions:  Increase Torsemide to '20mg'$  daily x 3 days, then back to as needed  Continue all other medications.     Labwork: BMET, BNP, MG - orders given today Office will contact with results via phone, letter or mychart.     Testing/Procedures: none  Follow-Up: 4 months   Any Other Special Instructions Will Be Listed Below (If Applicable).   If you need a refill on your cardiac medications before your next appointment, please call your pharmacy.

## 2022-03-30 DIAGNOSIS — R0602 Shortness of breath: Secondary | ICD-10-CM | POA: Diagnosis not present

## 2022-03-30 DIAGNOSIS — I110 Hypertensive heart disease with heart failure: Secondary | ICD-10-CM | POA: Diagnosis not present

## 2022-03-30 DIAGNOSIS — Z79899 Other long term (current) drug therapy: Secondary | ICD-10-CM | POA: Insufficient documentation

## 2022-03-30 DIAGNOSIS — J449 Chronic obstructive pulmonary disease, unspecified: Secondary | ICD-10-CM | POA: Diagnosis not present

## 2022-03-30 DIAGNOSIS — I509 Heart failure, unspecified: Secondary | ICD-10-CM | POA: Diagnosis not present

## 2022-03-30 DIAGNOSIS — E119 Type 2 diabetes mellitus without complications: Secondary | ICD-10-CM | POA: Insufficient documentation

## 2022-03-31 ENCOUNTER — Encounter (HOSPITAL_COMMUNITY): Payer: Self-pay | Admitting: Emergency Medicine

## 2022-03-31 ENCOUNTER — Other Ambulatory Visit: Payer: Self-pay

## 2022-03-31 ENCOUNTER — Emergency Department (HOSPITAL_COMMUNITY): Payer: Medicare HMO

## 2022-03-31 ENCOUNTER — Emergency Department (HOSPITAL_COMMUNITY)
Admission: EM | Admit: 2022-03-31 | Discharge: 2022-03-31 | Disposition: A | Discharge status: Home / Self Care | Payer: Medicare HMO | Attending: Emergency Medicine | Admitting: Emergency Medicine

## 2022-03-31 DIAGNOSIS — J441 Chronic obstructive pulmonary disease with (acute) exacerbation: Secondary | ICD-10-CM

## 2022-03-31 LAB — CBC WITH DIFFERENTIAL/PLATELET
Abs Immature Granulocytes: 0.01 10*3/uL (ref 0.00–0.07)
Basophils Absolute: 0 10*3/uL (ref 0.0–0.1)
Basophils Relative: 0 %
Eosinophils Absolute: 0.1 10*3/uL (ref 0.0–0.5)
Eosinophils Relative: 2 %
HCT: 29.5 % — ABNORMAL LOW (ref 39.0–52.0)
Hemoglobin: 8.7 g/dL — ABNORMAL LOW (ref 13.0–17.0)
Immature Granulocytes: 0 %
Lymphocytes Relative: 20 %
Lymphs Abs: 1.3 10*3/uL (ref 0.7–4.0)
MCH: 28.3 pg (ref 26.0–34.0)
MCHC: 29.5 g/dL — ABNORMAL LOW (ref 30.0–36.0)
MCV: 96.1 fL (ref 80.0–100.0)
Monocytes Absolute: 0.7 10*3/uL (ref 0.1–1.0)
Monocytes Relative: 11 %
Neutro Abs: 4.2 10*3/uL (ref 1.7–7.7)
Neutrophils Relative %: 67 %
Platelets: 250 10*3/uL (ref 150–400)
RBC: 3.07 MIL/uL — ABNORMAL LOW (ref 4.22–5.81)
RDW: 13.4 % (ref 11.5–15.5)
WBC: 6.3 10*3/uL (ref 4.0–10.5)
nRBC: 0 % (ref 0.0–0.2)

## 2022-03-31 LAB — COMPREHENSIVE METABOLIC PANEL
ALT: 8 U/L (ref 0–44)
AST: 12 U/L — ABNORMAL LOW (ref 15–41)
Albumin: 3.2 g/dL — ABNORMAL LOW (ref 3.5–5.0)
Alkaline Phosphatase: 54 U/L (ref 38–126)
Anion gap: 7 (ref 5–15)
BUN: 27 mg/dL — ABNORMAL HIGH (ref 8–23)
CO2: 28 mmol/L (ref 22–32)
Calcium: 8.6 mg/dL — ABNORMAL LOW (ref 8.9–10.3)
Chloride: 107 mmol/L (ref 98–111)
Creatinine, Ser: 1.69 mg/dL — ABNORMAL HIGH (ref 0.61–1.24)
GFR, Estimated: 40 mL/min — ABNORMAL LOW (ref >60)
Glucose, Bld: 96 mg/dL (ref 70–99)
Potassium: 4.5 mmol/L (ref 3.5–5.1)
Sodium: 142 mmol/L (ref 135–145)
Total Bilirubin: 0.5 mg/dL (ref 0.3–1.2)
Total Protein: 7 g/dL (ref 6.5–8.1)

## 2022-03-31 LAB — BRAIN NATRIURETIC PEPTIDE: B Natriuretic Peptide: 540 pg/mL — ABNORMAL HIGH (ref 0.0–100.0)

## 2022-03-31 LAB — TROPONIN I (HIGH SENSITIVITY): Troponin I (High Sensitivity): 29 ng/L — ABNORMAL HIGH (ref <18)

## 2022-03-31 MED ORDER — IPRATROPIUM-ALBUTEROL 0.5-2.5 (3) MG/3ML IN SOLN
3.0000 mL | Freq: Once | RESPIRATORY_TRACT | Status: AC
  Administered 2022-03-31: 3 mL via RESPIRATORY_TRACT
  Filled 2022-03-31: qty 3

## 2022-03-31 MED ORDER — METHYLPREDNISOLONE SODIUM SUCC 125 MG IJ SOLR
125.0000 mg | Freq: Once | INTRAMUSCULAR | Status: AC
  Administered 2022-03-31: 125 mg via INTRAVENOUS
  Filled 2022-03-31: qty 2

## 2022-03-31 MED ORDER — PREDNISONE 10 MG PO TABS: 20.0000 mg | ORAL_TABLET | Freq: Two times a day (BID) | ORAL | 0 refills | Status: DC

## 2022-03-31 NOTE — Discharge Instructions (Signed)
Begin taking prednisone as prescribed.   Continue use of your nebulizer every four hours for the next 2 days.  Return to the ER if symptoms significantly worsen or change.

## 2022-03-31 NOTE — ED Notes (Signed)
Pt given a urinal upon request.

## 2022-03-31 NOTE — ED Notes (Signed)
ED Provider at bedside. 

## 2022-03-31 NOTE — ED Notes (Signed)
Pt's daughter updated as to patient's status.

## 2022-03-31 NOTE — ED Provider Notes (Signed)
[facility] EMERGENCY DEPARTMENT Provider Note   CSN: [id number] Arrival date & time: 03/30/22  2346     History  Chief Complaint  Patient presents with   Shortness of [place] is a 81 y.o. male.  Patient is an 81 year old male with past medical history of COPD, CHF on home O2, HTN, Type 2 DM, CRI, recent admission for pneumonia.  Patient presenting today with complaints of shortness of breath.  This started yesterday evening and is worsening.  He denies having any chest pain, fevers, or productive cough.  He denies leg swelling.  No aggravating or alleviating factors.  The history is provided by the patient.       Home Medications Prior to Admission medications   Medication Sig Start Date End Date Taking? Authorizing Provider  albuterol (PROVENTIL) (2.5 MG/3ML) 0.083% nebulizer solution 2.5 mg every 4 hours if needed 03/08/22   Tanda Rockers, MD  atorvastatin (LIPITOR) 80 MG tablet Take by mouth. 03/01/22 03/31/22  [provider]  budesonide (PULMICORT) 0.25 MG/2ML nebulizer solution Take 2 mLs (0.25 mg total) by nebulization 2 (two) times daily. 01/28/22   Tanda Rockers, MD  dapagliflozin propanediol (FARXIGA) 10 MG TABS tablet Take 1 tablet (10 mg total) by mouth daily before breakfast. 02/25/22   Arnoldo Lenis, MD  diclofenac Sodium (VOLTAREN) 1 % GEL Apply 1 application topically 4 (four) times daily as needed (pain). 08/29/20   [provider]  ferrous sulfate 325 (65 FE) MG tablet Take 1 tablet (325 mg total) by mouth daily. 09/17/21 09/17/22  Erenest Rasher, PA-C  HM SENNA-S 8.6-50 MG tablet Take 2 tablets by mouth daily. 07/10/21   [provider]  HYDROcodone-acetaminophen (NORCO) 7.5-325 MG tablet Take 1 tablet by mouth 2 (two) times daily as needed for moderate pain. 11/27/20   [provider]  lactulose (CHRONULAC) 10 GM/15ML solution Take 30 mLs (20 g total) by mouth 3 (three) times daily as needed for mild  constipation. 03/13/22   Mahala Menghini, PA-C  metoprolol succinate (TOPROL XL) 25 MG 24 hr tablet Take 1 tablet (25 mg total) by mouth daily. 12/18/21   Strader, Fransisco Hertz, PA-C  OXYGEN Inhale 4 L into the lungs at bedtime as needed (for shortness of breath).    [provider]  pantoprazole (PROTONIX) 40 MG tablet Take 30- 60 min before your first and last meals of the day 02/24/22   Tanda Rockers, MD  tamsulosin (FLOMAX) 0.4 MG CAPS capsule Take 0.8 mg by mouth 2 (two) times daily.    [provider]  torsemide (DEMADEX) 20 MG tablet Take 1 tablet (20 mg total) by mouth as needed (swelling). 03/23/22   Arnoldo Lenis, MD  valsartan (DIOVAN) 80 MG tablet One twice daily 03/08/22   Tanda Rockers, MD      Allergies    Patient has no known allergies.    Review of Systems   Review of Systems  All other systems reviewed and are negative.   Physical Exam Updated Vital Signs BP 124/67   Pulse 82   Temp 98.6 F (37 C) (Oral)   Resp (!) 26   Ht '5\' 6"'$  (1.676 m)   Wt 63 kg   SpO2 100%   BMI 22.42 kg/m  Physical Exam Vitals and nursing note reviewed.  Constitutional:      General: He is not in acute distress.    Appearance: He is well-developed.  He is not diaphoretic.  HENT:     Head: Normocephalic and atraumatic.  Cardiovascular:     Rate and Rhythm: Normal rate and regular rhythm.     Heart sounds: No murmur heard.    No friction rub.  Pulmonary:     Effort: Pulmonary effort is normal. No respiratory distress.     Breath sounds: Normal breath sounds. No wheezing or rales.  Abdominal:     General: Bowel sounds are normal. There is no distension.     Palpations: Abdomen is soft.     Tenderness: There is no abdominal tenderness.  Musculoskeletal:        General: Normal range of motion.     Cervical back: Normal range of motion and neck supple.     Right lower leg: No tenderness. No edema.     Left lower leg: No tenderness. No edema.  Skin:    General:  Skin is warm and dry.  Neurological:     Mental Status: He is alert and oriented to person, place, and time.     Coordination: Coordination normal.     ED Results / Procedures / Treatments   Labs (all labs ordered are listed, but only abnormal results are displayed) Labs Reviewed - No data to display  EKG EKG Interpretation  Date/Time:  Wednesday March 31 2022 00:38:33 EDT Ventricular Rate:  86 PR Interval:  203 QRS Duration: 147 QT Interval:  435 QTC Calculation: 521 R Axis:   -80 Text Interpretation: Sinus rhythm Ventricular premature complex RBBB and LAFB No significant change since 09/21/2021 Confirmed by Veryl Speak [id number]) on 03/31/2022 1:01:02 AM  Radiology No results found.  Procedures Procedures    Medications Ordered in ED Medications  methylPREDNISolone sodium succinate (SOLU-MEDROL) 125 mg/2 mL injection 125 mg (has no administration in time range)  ipratropium-albuterol (DUONEB) 0.5-2.5 (3) MG/3ML nebulizer solution 3 mL (has no administration in time range)    ED Course/ Medical Decision Making/ A&P  This patient presents to the ED for concern of shortness of breath, this involves an extensive number of treatment options, and is a complaint that carries with it a high risk of complications and morbidity.  The differential diagnosis includes COPD exac, CHF exac, Pneumonia   Co morbidities that complicate the patient evaluation  none   Additional history obtained:  No additional history or external records needed   Lab Tests:  I Ordered, and personally interpreted labs.  The pertinent results include:  unremarkable CBC, CMP.  Troponin mildly elevated, but consistent with baseline.  BNP mildly elevated, but better than baseline.   Imaging Studies ordered:  I ordered imaging studies including chest x-ray  I independently visualized and interpreted imaging which showed no acute process I agree with the radiologist interpretation   Cardiac  Monitoring: / EKG:  The patient was maintained on a cardiac monitor.  I personally viewed and interpreted the cardiac monitored which showed an underlying rhythm of: sinus   Consultations Obtained:  No consultations needed   Problem List / ED Course / Critical interventions / Medication management  Patient presenting with shob that I suspect is an exacerbation of COPD.  His X-ray is not consistent with CHF or pneumonia.  He was given a duoneb and solumedrol with some improvement.  His oxygen saturations are 100% on 3 LNC and he is in no distress.  He has oxygen at home and I feel the patient can safely be discharged with prednisone, continued home nebs, and as needed  return. I have reviewed the patients home medicines and have made adjustments as needed   Social Determinants of Health:  None   Test / Admission - Considered:  No hypoxia or respiratory distress and patient appears appropriate for discharge.  Final Clinical Impression(s) / ED Diagnoses Final diagnoses:  None    Rx / DC Orders ED Discharge Orders     None         Veryl Speak, MD 03/31/22 [phone]

## 2022-03-31 NOTE — ED Notes (Signed)
X-ray at bedside

## 2022-03-31 NOTE — ED Triage Notes (Signed)
Pt c/o increased sob, he is speaking in complete sentences and 100% on 3lpm. Pt is on continuous o2.

## 2022-03-31 NOTE — ED Notes (Signed)
RT notified of pt's duoneb

## 2022-04-02 ENCOUNTER — Telehealth: Payer: Self-pay | Admitting: Cardiology

## 2022-04-02 NOTE — Telephone Encounter (Signed)
Pt c/o medication issue:  1. Name of Medication:   torsemide (DEMADEX) 20 MG tablet    2. How are you currently taking this medication (dosage and times per day)? Take 1 tablet (20 mg total) by mouth as needed (swelling).  3. Are you having a reaction (difficulty breathing--STAT)? No  4. What is your medication issue? Daughter states that pt was to take medication for a certain amount of days and have labs done. She says that pt missed a few days due to having to go to the hospital. She would like to know if he should go ahead and have labs done or take medication through the weekend then go have labs done. Please advise

## 2022-04-02 NOTE — Telephone Encounter (Signed)
Informed daughter Apolonio Schneiders) would be fine to go on Monday.

## 2022-04-06 LAB — BASIC METABOLIC PANEL
BUN/Creatinine Ratio: 17 (ref 10–24)
BUN: 34 mg/dL — ABNORMAL HIGH (ref 8–27)
CO2: 24 mmol/L (ref 20–29)
Calcium: 8.5 mg/dL — ABNORMAL LOW (ref 8.6–10.2)
Chloride: 96 mmol/L (ref 96–106)
Creatinine, Ser: 1.97 mg/dL — ABNORMAL HIGH (ref 0.76–1.27)
Glucose: 172 mg/dL — ABNORMAL HIGH (ref 70–99)
Potassium: 4.8 mmol/L (ref 3.5–5.2)
Sodium: 134 mmol/L (ref 134–144)
eGFR: 34 mL/min/{1.73_m2} — ABNORMAL LOW (ref >59)

## 2022-04-06 LAB — BRAIN NATRIURETIC PEPTIDE: BNP: 68.5 pg/mL (ref 0.0–100.0)

## 2022-04-06 LAB — MAGNESIUM: Magnesium: 2.2 mg/dL (ref 1.6–2.3)

## 2022-04-07 NOTE — Addendum Note (Signed)
Addended by: Sung Amabile on: 04/07/2022 10:42 AM   Modules accepted: Orders

## 2022-04-08 ENCOUNTER — Ambulatory Visit: Payer: Medicare HMO | Admitting: Internal Medicine

## 2022-04-08 NOTE — Progress Notes (Deleted)
Gregory Mitchell, male    DOB: Jul 24, 1941    MRN: [id number]   Brief patient profile:  9   yobm  quit smoking 1978 no apparent  resp sequelae referred to pulmonary clinic in [facility]  01/28/2022 for copd eval  by Bernerd Pho PA following in cards clinic for severe chf     Prior pulmonary eval 2015 c/o doe x 2 y with GOLD 3 criteria at that point with hrct 11/16/13 with bronchiectasis ? MAI   Rx 02 around 2021  ? After covid ?   History of Present Illness  01/28/2022  Pulmonary/ 1st office eval/ Trevonn Hallum / [name] / trelegy maint Chief Complaint  Patient presents with   Consult    Ref by cardiology for copd  does have O2 at home he uses prn   Dyspnea:  maybe 100 ft at grocery handicap parking  Cough: x  years worse x one year p covid 19 in 05/2020 along with 02 need assoc with hoarsness / min  white mucus production worse in ams Sleep:on side flat bed 2 pillows  SABA use: not really helping  02 :  3lpm hs and prn but not titrating  Rec Stop trelegy and the ventolin / albuterol  Pantoprazole 40 mg Take 30- 60 min before your first and last meals of the day  Take duoneb  four times daily  Add budesonide 0.25 mg twice daily to the nebulizer  Prednisone 10 mg take  4 each am x 2 days,   2 each am x 2 days,  1 each am x 2 days and stop     Allergy screen 01/28/22  >  Eos 0.2 /  IgE  / ESR 41.  Wear 02 so you keep your 02 saturation s above 90% at all time  For cough > mucinex 1200 mg every 12 hours and Use the flutter valve as much possible Please schedule a follow up office visit in 4 weeks, sooner if needed  with all medications /inhalers/ solutions     02/24/2022  f/u ov/[place] office/Shaquitta Burbridge re: copd/uacs  maint on nebs but confused on which ones when / also still on entresto   Chief Complaint  Patient presents with   Follow-up    Feels breathing is getting worse.   Dyspnea:  [employer] leaning on cart/ s 02  Cough: yellowish better p levaquin then worse again   Sleeping: on bed is level/flat with 2 pillows  SABA use: not much hfa 02: 4lpm 24/7  Covid status: vax x 2  Rec Protonix 40 mg Take 30- 60 min before your first and last meals of the day  - new prescription today  Prednisone Take 4 for three days 3 for three days 2 for three days 1 for three days and stop  Levaquin 500 mg daily  x 7 days  Plan A = Automatic = Always=    Aformoterol 15 mcg 1st thing in am and 12 hours and budsonide  0.5  Plan B = Backup (to supplement plan A, not to replace it) Only use your albuterol nebulizer  as a rescue medication Stop fish oil  GERD diet   I will call Dr Harl Bowie about the [facility]   Please schedule a follow up office visit in  2 weeks  - if getting worse please go to [place] date: 02/26/2022 Discharge date: 03/02/2022  Discharge to: Home with home health to follow-up with pulmonology and cardiology as well as  PCP Discharge Diagnoses:  Pneumonia of left lower lobe due to infectious organism COPD (chronic obstructive pulmonary disease) (CMS-HCC) Heart failure with reduced ejection fraction (CMS-HCC) History of pulmonary embolus (PE) Diabetes mellitus type 2, noninsulin dependent (CMS-HCC) Dysphagia Resolved Problems: Shortness of breath TIA (transient ischemic attack) Atypical chest pain   Procedures: CT head 7/22 CT abdomen and pelvis, chest 7/22 Chest x-ray 7/22 MRI brain 7/24 V/Q scan 7/24 Venous duplex bilateral lower extremities 7/24  Consults:  Physical Therapy Evaluation and Treatment and Occupational Therapy Evaluation and Treatment, speech therapy Hospital Course:  Gregory Mitchell is a 81 y.o. male that presented to [facility] and was admitted for Pneumonia of left lower lobe due to infectious organism on 02/26/2022 11:22 PM . Patient presented to the ED on 7/22 after being unable to talk for 3 to 4 minutes the evening prior. Could not walk or lift his arms. Mostly normal function was restored after  about 5 minutes. He was also having shortness of breath and wheezing. Uses home oxygen at 3 to 4 L. Was admitted to the hospital for left lower lobe pneumonia, TIA stroke work-up, and heart failure.  The following problems were addressed while the patient was admitted to [facility];  1. Dyspnea/chronic hypoxemic respiratory failure/possible left lower lobe pneumonia, bacterial. Patient was admitted for symptoms of worsening shortness of breath.. Chest x-ray with mild, stable left basilar atelectasis versus infiltrate. Patient was afebrile, does not have leukocytosis. He was already on Levaquin and that was continued, dose adjusted. Ct chest wo contrast with chronic postinflammatory changes in the lungs with bilateral apical scarring, bronchiectasis and chronic consolidation in the lung bases, no significant change from previous study. Aggressive pulmonary toiletry was encouraged throughout admission. He has an appointment to follow-up with his pulmonologist Dr. Melvyn Novas in the next couple of weeks. Encouraged patient's daughter Apolonio Schneiders to make sure he keeps this appointment. 2. COPD with mild exacerbation/underlying bronchiectasis. Patient was recently seen by pulmonology, has history of COPD and bronchiectasis. He was given DuoNebs, Levaquin, IV Solu-Medrol throughout his hospitalization.  3. Abdominal pain. CT abdomen and pelvis without contrast with no acute changes, showed surgical absence of right kidney, left inguinal hernia with no obstruction. Abdominal pain resolved before discharge. Used Colace and MiraLAX to encourage bowel regularity. 4. Chronic combined systolic and diastolic CHF. Patient has history of nonischemic cardiomyopathy. EF is around 20-25%. Continued his home medications of Delene Loll, Farxiga, Lopressor, torsemide. 5. Possible TIA, ruled out stroke. Patient presented with symptoms of decreased sensations and weakness in his both arms and legs. There was some difficulty with  speech as well. CT scan of the head negative. Symptoms were resolved before discharge. MRI of the brain was negative. He was provided with home health PT at discharge. 6. Type 2 diabetes. Continued with diabetic diet. Insulin sliding scale. A1c 6.2. 7. History of PE. Patient apparently was recently taken off of Eliquis by his doctors. D-dimer slightly elevated on admission, VQ scan and lower extremity Dopplers negative for clot. 8. Mild hyperkalemia. Resolved, received lokelma. 4.3-day at discharge. 9. Anemia of chronic disease. Hemoglobin appears to be stable close to baseline throughout admission. 9.1-day of discharge. 10. CKD stage IIIa. Renal function is close to baseline. Continue to monitor closely outpatient. 1.63 creatinine day of discharge. 11. Dysphagia; patient was seen by speech therapy today. She would like to conduct modified barium swallow but agrees to do it outpatient. Central scheduling will set this up.  Patient's testing was negative for  stroke or thrombus. He is very anxious to go home today and tells me that he will leave whether he is discharged or not. He is very ill-appearing with persistent cough but this may be his baseline. Spoke with patient's daughter Apolonio Schneiders by phone extensively and she states he has good support and care at home, lives with his son and daughter-in-law. Apolonio Schneiders also checks on him daily. Encouraged close follow-up with pulmonology and PCP. Ordered modified barium swallow for outpatient. Discharge plan discussed with Dr. Leory Plowman who agrees with the plan. Discharge home with home health. He will continue his home oxygen use.  Discharge Medications:   Your Medication List   STOP taking these medications  albuterol 2.5 mg /3 mL (0.083 %) nebulizer solution  apixaban 5 mg Tab  Commonly known as: ELIQUIS  budesonide-formoteroL 160-4.5 mcg/actuation inhaler Commonly known as: SYMBICORT  DALIRESP 250 mcg Tab Generic drug: roflumilast  esomeprazole 40 MG  capsule Commonly known as: NexIUM  famotidine 40 MG tablet Commonly known as: PEPCID  fluconazole 200 MG tablet Commonly known as: DIFLUCAN  hydrALAZINE 10 MG tablet Commonly known as: APRESOLINE  ipratropium 0.02 % nebulizer solution Commonly known as: ATROVENT  methylPREDNISolone 4 mg tablet Commonly known as: MEDROL DOSEPACK  mirtazapine 15 MG tablet Commonly known as: REMERON  omeprazole 40 MG capsule Commonly known as: PriLOSEC  ondansetron 8 MG disintegrating tablet Commonly known as: ZOFRAN-ODT  prednisoLONE sodium phosphate 5 mg base/5 mL (6.7 mg/5 mL) solution Commonly known as: PEDIAPRED    03/08/2022  f/u ov/[place] office/Louie Flenner re: GOLD 3  maint on formoterol  but thoroughly confused with meds/ instructoins from [place]  Patient presents with   Follow-up    Patient feels he is about the same since last visit 2 weeks ago  Dyspnea:  walked in with cane x 50 fts  Cough: yellow mucus looks the same on / off levaquin Sleeping: 4lpm and flat bed/ 2 pillows  SABA use: confused  02: 4lpm hs and prn  Covid status: vax x 3  Rec Protonix (pantoprazole) 40 mg Take 30- 60 min before your first and last meals of the day  - new prescription today  Plan A = Automatic = Always=    Aformoterol 15 mcg 1st thing in am and 12 hours and budsonide 0.25 twice daily  Plan B = Backup (to supplement plan A, not to replace it) Only use your albuterol nebulizer  as a rescue medication  GERD rx Stop entresto  Start valsartan  For cough > mucinex dm 1200 mg every 12 hours and use flutter valve as much as possible    Please schedule a follow up office visit in 4 weeks, sooner if needed  with all medications /inhalers/ solutions/ flutter valve in hand     04/08/2022  f/u ov/ office/Shreena Baines re: *** maint on ***  No chief complaint on file.   Dyspnea:  *** Cough: *** Sleeping: *** SABA use: *** 02: *** Covid status: *** Lung cancer screening:  ***   No obvious day to day or daytime variability or assoc excess/ purulent sputum or mucus plugs or hemoptysis or cp or chest tightness, subjective wheeze or overt sinus or hb symptoms.   *** without nocturnal  or early am exacerbation  of respiratory  c/o's or need for noct saba. Also denies any obvious fluctuation of symptoms with weather or environmental changes or other aggravating or alleviating factors except as outlined above   No unusual exposure hx or h/o  childhood pna/ asthma or knowledge of premature birth.  Current Allergies, Complete Past Medical History, Past Surgical History, Family History, and Social History were reviewed in [employer] record.  ROS  The following are not active complaints unless bolded Hoarseness, sore throat, dysphagia, dental problems, itching, sneezing,  nasal congestion or discharge of excess mucus or purulent secretions, ear ache,   fever, chills, sweats, unintended wt loss or wt gain, classically pleuritic or exertional cp,  orthopnea pnd or arm/hand swelling  or leg swelling, presyncope, palpitations, abdominal pain, anorexia, nausea, vomiting, diarrhea  or change in bowel habits or change in bladder habits, change in stools or change in urine, dysuria, hematuria,  rash, arthralgias, visual complaints, headache, numbness, weakness or ataxia or problems with walking or coordination,  change in mood or  memory.        No outpatient medications have been marked as taking for the 04/08/22 encounter (Appointment) with Tanda Rockers, MD.                    Past Medical History:  Diagnosis Date   Asthma    Chronic renal insufficiency    COPD (chronic obstructive pulmonary disease) ([place])    Heart failure ([place])    a. EF 25-30% in 08/2019 with cath showing normal cors b. EF 20-25% in 09/2020 c. 30-35% in 02/2021 d. EF 20-25% in 10/2021   Hypertension    Pulmonary emboli ([place]) 03/2019   Renal insufficiency    Type 2 diabetes  mellitus (HCC)        Objective:    Wts  04/08/2022        ***  03/08/2022       148   02/24/22 151 lb 3.2 oz (68.6 kg)  01/28/22 148 lb (67.1 kg)  12/18/21 149 lb (67.6 kg)    Vital signs reviewed  04/08/2022  - Note at rest 02 sats  ***% on ***   General appearance:    ***      Mild barr ***    I personally reviewed images and agree with radiology impression as follows:   Chest CT s contrast 02/27/22 vs 08/05/21 : Mild bilateral apical fibrosis. Bronchial wall  thickening consistent with chronic bronchitis. Focal areas of  consolidation in the lung bases surrounding areas of bronchiectasis.  Scattered fine tree-in-bud infiltrates most prominent in the left  lower lung and lingula. Findings appear similar to previous study.         Assessment

## 2022-04-14 ENCOUNTER — Emergency Department (HOSPITAL_COMMUNITY): Payer: Medicare HMO

## 2022-04-14 ENCOUNTER — Other Ambulatory Visit: Payer: Self-pay

## 2022-04-14 ENCOUNTER — Encounter (HOSPITAL_COMMUNITY): Payer: Self-pay

## 2022-04-14 ENCOUNTER — Inpatient Hospital Stay (HOSPITAL_COMMUNITY): Payer: Medicare HMO

## 2022-04-14 ENCOUNTER — Inpatient Hospital Stay (HOSPITAL_COMMUNITY)
Admission: EM | Admit: 2022-04-14 | Discharge: 2022-04-16 | DRG: 193 | Disposition: A | Discharge status: Home / Self Care | Payer: Medicare HMO | Attending: Family Medicine | Admitting: Family Medicine

## 2022-04-14 DIAGNOSIS — E1122 Type 2 diabetes mellitus with diabetic chronic kidney disease: Secondary | ICD-10-CM | POA: Diagnosis present

## 2022-04-14 DIAGNOSIS — J9621 Acute and chronic respiratory failure with hypoxia: Secondary | ICD-10-CM | POA: Diagnosis present

## 2022-04-14 DIAGNOSIS — Z79899 Other long term (current) drug therapy: Secondary | ICD-10-CM | POA: Diagnosis not present

## 2022-04-14 DIAGNOSIS — J441 Chronic obstructive pulmonary disease with (acute) exacerbation: Secondary | ICD-10-CM | POA: Diagnosis present

## 2022-04-14 DIAGNOSIS — J9601 Acute respiratory failure with hypoxia: Principal | ICD-10-CM

## 2022-04-14 DIAGNOSIS — R253 Fasciculation: Secondary | ICD-10-CM | POA: Diagnosis present

## 2022-04-14 DIAGNOSIS — Z86711 Personal history of pulmonary embolism: Secondary | ICD-10-CM

## 2022-04-14 DIAGNOSIS — I429 Cardiomyopathy, unspecified: Secondary | ICD-10-CM | POA: Diagnosis present

## 2022-04-14 DIAGNOSIS — Z9981 Dependence on supplemental oxygen: Secondary | ICD-10-CM

## 2022-04-14 DIAGNOSIS — I10 Essential (primary) hypertension: Secondary | ICD-10-CM | POA: Diagnosis present

## 2022-04-14 DIAGNOSIS — Z823 Family history of stroke: Secondary | ICD-10-CM | POA: Diagnosis not present

## 2022-04-14 DIAGNOSIS — I5042 Chronic combined systolic (congestive) and diastolic (congestive) heart failure: Secondary | ICD-10-CM | POA: Diagnosis present

## 2022-04-14 DIAGNOSIS — Z20822 Contact with and (suspected) exposure to covid-19: Secondary | ICD-10-CM | POA: Diagnosis present

## 2022-04-14 DIAGNOSIS — R479 Unspecified speech disturbances: Secondary | ICD-10-CM | POA: Diagnosis present

## 2022-04-14 DIAGNOSIS — E860 Dehydration: Secondary | ICD-10-CM | POA: Diagnosis present

## 2022-04-14 DIAGNOSIS — M6281 Muscle weakness (generalized): Secondary | ICD-10-CM | POA: Diagnosis present

## 2022-04-14 DIAGNOSIS — T45516A Underdosing of anticoagulants, initial encounter: Secondary | ICD-10-CM | POA: Diagnosis present

## 2022-04-14 DIAGNOSIS — R4789 Other speech disturbances: Secondary | ICD-10-CM | POA: Diagnosis present

## 2022-04-14 DIAGNOSIS — E1100 Type 2 diabetes mellitus with hyperosmolarity without nonketotic hyperglycemic-hyperosmolar coma (NKHHC): Secondary | ICD-10-CM | POA: Diagnosis not present

## 2022-04-14 DIAGNOSIS — T378X5A Adverse effect of other specified systemic anti-infectives and antiparasitics, initial encounter: Secondary | ICD-10-CM | POA: Diagnosis present

## 2022-04-14 DIAGNOSIS — Z905 Acquired absence of kidney: Secondary | ICD-10-CM

## 2022-04-14 DIAGNOSIS — J189 Pneumonia, unspecified organism: Secondary | ICD-10-CM | POA: Diagnosis not present

## 2022-04-14 DIAGNOSIS — I130 Hypertensive heart and chronic kidney disease with heart failure and stage 1 through stage 4 chronic kidney disease, or unspecified chronic kidney disease: Secondary | ICD-10-CM | POA: Diagnosis present

## 2022-04-14 DIAGNOSIS — I959 Hypotension, unspecified: Secondary | ICD-10-CM | POA: Diagnosis present

## 2022-04-14 DIAGNOSIS — J180 Bronchopneumonia, unspecified organism: Principal | ICD-10-CM | POA: Diagnosis present

## 2022-04-14 DIAGNOSIS — Z7951 Long term (current) use of inhaled steroids: Secondary | ICD-10-CM | POA: Diagnosis not present

## 2022-04-14 DIAGNOSIS — I2699 Other pulmonary embolism without acute cor pulmonale: Secondary | ICD-10-CM | POA: Diagnosis not present

## 2022-04-14 DIAGNOSIS — Z7984 Long term (current) use of oral hypoglycemic drugs: Secondary | ICD-10-CM

## 2022-04-14 DIAGNOSIS — Z87891 Personal history of nicotine dependence: Secondary | ICD-10-CM

## 2022-04-14 DIAGNOSIS — J449 Chronic obstructive pulmonary disease, unspecified: Secondary | ICD-10-CM | POA: Diagnosis not present

## 2022-04-14 DIAGNOSIS — N1831 Chronic kidney disease, stage 3a: Secondary | ICD-10-CM | POA: Diagnosis present

## 2022-04-14 DIAGNOSIS — J470 Bronchiectasis with acute lower respiratory infection: Secondary | ICD-10-CM | POA: Diagnosis present

## 2022-04-14 DIAGNOSIS — Z801 Family history of malignant neoplasm of trachea, bronchus and lung: Secondary | ICD-10-CM

## 2022-04-14 DIAGNOSIS — J9611 Chronic respiratory failure with hypoxia: Secondary | ICD-10-CM | POA: Diagnosis not present

## 2022-04-14 DIAGNOSIS — Z91148 Patient's other noncompliance with medication regimen for other reason: Secondary | ICD-10-CM

## 2022-04-14 DIAGNOSIS — E119 Type 2 diabetes mellitus without complications: Secondary | ICD-10-CM

## 2022-04-14 LAB — COMPREHENSIVE METABOLIC PANEL
ALT: 11 U/L (ref 0–44)
AST: 17 U/L (ref 15–41)
Albumin: 2.9 g/dL — ABNORMAL LOW (ref 3.5–5.0)
Alkaline Phosphatase: 55 U/L (ref 38–126)
Anion gap: 8 (ref 5–15)
BUN: 25 mg/dL — ABNORMAL HIGH (ref 8–23)
CO2: 24 mmol/L (ref 22–32)
Calcium: 7.8 mg/dL — ABNORMAL LOW (ref 8.9–10.3)
Chloride: 103 mmol/L (ref 98–111)
Creatinine, Ser: 2.04 mg/dL — ABNORMAL HIGH (ref 0.61–1.24)
GFR, Estimated: 32 mL/min — ABNORMAL LOW (ref >60)
Glucose, Bld: 165 mg/dL — ABNORMAL HIGH (ref 70–99)
Potassium: 3.4 mmol/L — ABNORMAL LOW (ref 3.5–5.1)
Sodium: 135 mmol/L (ref 135–145)
Total Bilirubin: 0.2 mg/dL — ABNORMAL LOW (ref 0.3–1.2)
Total Protein: 6.5 g/dL (ref 6.5–8.1)

## 2022-04-14 LAB — RESP PANEL BY RT-PCR (FLU A&B, COVID) ARPGX2
Influenza A by PCR: NEGATIVE
Influenza B by PCR: NEGATIVE
SARS Coronavirus 2 by RT PCR: NEGATIVE

## 2022-04-14 LAB — URINALYSIS, ROUTINE W REFLEX MICROSCOPIC
Bacteria, UA: NONE SEEN
Bilirubin Urine: NEGATIVE
Glucose, UA: 150 mg/dL — AB
Ketones, ur: NEGATIVE mg/dL
Leukocytes,Ua: NEGATIVE
Nitrite: NEGATIVE
Protein, ur: NEGATIVE mg/dL
Specific Gravity, Urine: 1.009 (ref 1.005–1.030)
pH: 5 (ref 5.0–8.0)

## 2022-04-14 LAB — CBC
HCT: 30.7 % — ABNORMAL LOW (ref 39.0–52.0)
Hemoglobin: 9.2 g/dL — ABNORMAL LOW (ref 13.0–17.0)
MCH: 28 pg (ref 26.0–34.0)
MCHC: 30 g/dL (ref 30.0–36.0)
MCV: 93.6 fL (ref 80.0–100.0)
Platelets: 265 10*3/uL (ref 150–400)
RBC: 3.28 MIL/uL — ABNORMAL LOW (ref 4.22–5.81)
RDW: 13.6 % (ref 11.5–15.5)
WBC: 9.6 10*3/uL (ref 4.0–10.5)
nRBC: 0 % (ref 0.0–0.2)

## 2022-04-14 LAB — TROPONIN I (HIGH SENSITIVITY)
Troponin I (High Sensitivity): 20 ng/L — ABNORMAL HIGH (ref <18)
Troponin I (High Sensitivity): 17 ng/L (ref <18)

## 2022-04-14 LAB — APTT: aPTT: 34 seconds (ref 24–36)

## 2022-04-14 LAB — LACTIC ACID, PLASMA
Lactic Acid, Venous: 2.2 mmol/L (ref 0.5–1.9)
Lactic Acid, Venous: 1.8 mmol/L (ref 0.5–1.9)

## 2022-04-14 LAB — BRAIN NATRIURETIC PEPTIDE: B Natriuretic Peptide: 230 pg/mL — ABNORMAL HIGH (ref 0.0–100.0)

## 2022-04-14 LAB — PROTIME-INR
INR: 1.1 (ref 0.8–1.2)
Prothrombin Time: 14.2 seconds (ref 11.4–15.2)

## 2022-04-14 LAB — D-DIMER, QUANTITATIVE: D-Dimer, Quant: 1.11 ug/mL-FEU — ABNORMAL HIGH (ref 0.00–0.50)

## 2022-04-14 LAB — GLUCOSE, CAPILLARY: Glucose-Capillary: 280 mg/dL — ABNORMAL HIGH (ref 70–99)

## 2022-04-14 MED ORDER — LACTULOSE 10 GM/15ML PO SOLN: 20.0000 g | Freq: Three times a day (TID) | ORAL | Status: DC | PRN

## 2022-04-14 MED ORDER — PANTOPRAZOLE SODIUM 40 MG PO TBEC
40.0000 mg | DELAYED_RELEASE_TABLET | Freq: Two times a day (BID) | ORAL | Status: DC
  Administered 2022-04-15 – 2022-04-16 (×3): 40 mg via ORAL
  Filled 2022-04-14 (×3): qty 1

## 2022-04-14 MED ORDER — BUDESONIDE 0.25 MG/2ML IN SUSP
0.2500 mg | Freq: Two times a day (BID) | RESPIRATORY_TRACT | Status: DC
  Administered 2022-04-15 – 2022-04-16 (×3): 0.25 mg via RESPIRATORY_TRACT
  Filled 2022-04-14 (×3): qty 2

## 2022-04-14 MED ORDER — INSULIN ASPART 100 UNIT/ML IJ SOLN
0.0000 [IU] | Freq: Every day | INTRAMUSCULAR | Status: DC
  Administered 2022-04-14: 3 [IU] via SUBCUTANEOUS

## 2022-04-14 MED ORDER — HYDROCODONE-ACETAMINOPHEN 7.5-325 MG PO TABS
1.0000 | ORAL_TABLET | Freq: Two times a day (BID) | ORAL | Status: DC | PRN
  Administered 2022-04-16 (×2): 1 via ORAL
  Filled 2022-04-14 (×2): qty 1

## 2022-04-14 MED ORDER — LACTATED RINGERS IV BOLUS (SEPSIS)
1000.0000 mL | Freq: Once | INTRAVENOUS | Status: AC
  Administered 2022-04-14: 1000 mL via INTRAVENOUS

## 2022-04-14 MED ORDER — ATORVASTATIN CALCIUM 40 MG PO TABS
80.0000 mg | ORAL_TABLET | Freq: Every evening | ORAL | Status: DC
  Administered 2022-04-15: 80 mg via ORAL
  Filled 2022-04-14 (×2): qty 2

## 2022-04-14 MED ORDER — ALBUTEROL SULFATE (2.5 MG/3ML) 0.083% IN NEBU
INHALATION_SOLUTION | RESPIRATORY_TRACT | Status: AC
  Administered 2022-04-14: 10 mg
  Filled 2022-04-14: qty 12

## 2022-04-14 MED ORDER — ALBUTEROL (5 MG/ML) CONTINUOUS INHALATION SOLN
10.0000 mg | INHALATION_SOLUTION | RESPIRATORY_TRACT | Status: AC
  Filled 2022-04-14: qty 20

## 2022-04-14 MED ORDER — POTASSIUM CHLORIDE CRYS ER 20 MEQ PO TBCR
40.0000 meq | EXTENDED_RELEASE_TABLET | Freq: Once | ORAL | Status: AC
  Administered 2022-04-14: 40 meq via ORAL
  Filled 2022-04-14: qty 2

## 2022-04-14 MED ORDER — POLYETHYLENE GLYCOL 3350 17 G PO PACK: 17.0000 g | PACK | Freq: Every day | ORAL | Status: DC | PRN

## 2022-04-14 MED ORDER — METHYLPREDNISOLONE SODIUM SUCC 125 MG IJ SOLR
125.0000 mg | Freq: Once | INTRAMUSCULAR | Status: AC
  Administered 2022-04-14: 125 mg via INTRAVENOUS
  Filled 2022-04-14: qty 2

## 2022-04-14 MED ORDER — ACETAMINOPHEN 650 MG RE SUPP: 650.0000 mg | Freq: Four times a day (QID) | RECTAL | Status: DC | PRN

## 2022-04-14 MED ORDER — IPRATROPIUM-ALBUTEROL 0.5-2.5 (3) MG/3ML IN SOLN: 3.0000 mL | RESPIRATORY_TRACT | Status: DC | PRN

## 2022-04-14 MED ORDER — ACETAMINOPHEN 325 MG PO TABS: 650.0000 mg | ORAL_TABLET | Freq: Four times a day (QID) | ORAL | Status: DC | PRN

## 2022-04-14 MED ORDER — GUAIFENESIN-DM 100-10 MG/5ML PO SYRP: 10.0000 mL | ORAL_SOLUTION | Freq: Four times a day (QID) | ORAL | Status: DC | PRN

## 2022-04-14 MED ORDER — LACTATED RINGERS IV SOLN: INTRAVENOUS | Status: DC

## 2022-04-14 MED ORDER — HEPARIN SODIUM (PORCINE) 5000 UNIT/ML IJ SOLN
5000.0000 [IU] | Freq: Three times a day (TID) | INTRAMUSCULAR | Status: DC
  Administered 2022-04-14 – 2022-04-16 (×5): 5000 [IU] via SUBCUTANEOUS
  Filled 2022-04-14 (×5): qty 1

## 2022-04-14 MED ORDER — VANCOMYCIN HCL IN DEXTROSE 1-5 GM/200ML-% IV SOLN
1000.0000 mg | Freq: Once | INTRAVENOUS | Status: AC
Start: 2022-04-14 — End: 2022-04-14
  Administered 2022-04-14: 1000 mg via INTRAVENOUS
  Filled 2022-04-14: qty 200

## 2022-04-14 MED ORDER — IPRATROPIUM-ALBUTEROL 0.5-2.5 (3) MG/3ML IN SOLN
3.0000 mL | Freq: Three times a day (TID) | RESPIRATORY_TRACT | Status: DC
  Administered 2022-04-14: 3 mL via RESPIRATORY_TRACT
  Filled 2022-04-14: qty 3

## 2022-04-14 MED ORDER — INSULIN ASPART 100 UNIT/ML IJ SOLN
0.0000 [IU] | Freq: Three times a day (TID) | INTRAMUSCULAR | Status: DC
  Administered 2022-04-15: 3 [IU] via SUBCUTANEOUS
  Administered 2022-04-15 – 2022-04-16 (×2): 2 [IU] via SUBCUTANEOUS

## 2022-04-14 MED ORDER — PIPERACILLIN-TAZOBACTAM 3.375 G IVPB 30 MIN
3.3750 g | Freq: Once | INTRAVENOUS | Status: AC
  Administered 2022-04-14: 3.375 g via INTRAVENOUS
  Filled 2022-04-14: qty 50

## 2022-04-14 NOTE — ED Triage Notes (Signed)
Pt to er pt states that he is here for chest pain, states that the pain started last night, states that he also feels short of breath.  States that he just got out of [place] for pneumonia.

## 2022-04-14 NOTE — Assessment & Plan Note (Addendum)
History of PE.  He is not taking Eliquis, listed on home med list.  He reports chest pain and dyspnea, he is tachycardic, but this may be related to his hypotension and dehydration.  Troponin and EKG is unremarkable.  Likely from his COPD and dyspnea, -Check D-dimer and if elevated will obtain VQ scan

## 2022-04-14 NOTE — Assessment & Plan Note (Signed)
-   Controlled.  A1c 6.2.  -  SSI- s -Hold Farxiga

## 2022-04-14 NOTE — Assessment & Plan Note (Addendum)
Diagnosed with pneumonia 8/31, hospitalized for 1 day before patient requested to be discharged.  Treated with antibiotics IV meropenem and doxycycline-still is a concern for aspiration, and discharged to complete a course of moxifloxacin which he has taken for 6 days.  Clinically his cough has improved, his dyspnea is the same. On exam, he does not appear dyspneic.  He has COPD with chronic respiratory failure that is likely contributing to his dyspnea.  -Afebrile without leukocytosis. -Hold off on further antibiotics at this time -Monitor for now -D-dimer if elevated check V/Q

## 2022-04-14 NOTE — Assessment & Plan Note (Signed)
CKD stage 3 a - 3 b.  Creatinine today 2, close to what appears to be new baseline.  Gradual uptrend over the past few months from 1.3 >> 1.9.

## 2022-04-14 NOTE — Telephone Encounter (Signed)
Noted  

## 2022-04-14 NOTE — Assessment & Plan Note (Signed)
Stable.  With chronic respiratory failure on 3 L.  Benign lung exam. -DuoNebs as needed and scheduled. -125 mg Solu-Medrol given in ED, hold off on further steroids for now.

## 2022-04-14 NOTE — Telephone Encounter (Signed)
Appt made with Dr. Abbey Chatters for 10/4 at 10:15.  Spoke with patient's daughter to make appt.

## 2022-04-14 NOTE — Assessment & Plan Note (Signed)
Stable and compensated.  Likely hypovolemic at this time.  Considering initial hypotension. Last echo 10/2020 EF of 20 to 25% with grade 2 DD. -Hold diuretics at this time, hold Entresto.

## 2022-04-14 NOTE — Telephone Encounter (Signed)
At this point, he went back into the hospital with PNA and did not see pulmonology for clearance.   Please arrange for Dr. Abbey Chatters to see patient in follow up. Need to determine next steps, if we should hold off on endoscopic attempts given his respiratory disease.   Please schedule ov with Dr. Abbey Chatters in the next few weeks.

## 2022-04-14 NOTE — Assessment & Plan Note (Signed)
Daughter reports hesitancy/delay in patient's voice in his words.   He has had intermittent twitching since discharge from hospital 9/1, may be related to quinolone.  Generalized weakness without focal deficits. -Head CT -PT evaluation.

## 2022-04-14 NOTE — ED Notes (Signed)
Awaiting antibiotics orders.

## 2022-04-14 NOTE — Sepsis Progress Note (Signed)
Sepsis protocol monitored by [employer] 

## 2022-04-14 NOTE — H&P (Signed)
History and Physical    Gregory Mitchell ASN:[id number] DOB: 1940-08-31 DOA: 04/14/2022  PCP: Beola Cord, FNP   Patient coming from: Home  I have personally briefly reviewed patient's old medical records in [place]  Chief Complaint: Difficulty breathing.  HPI: Gregory Mitchell is a 81 y.o. male with medical history significant for diabetes mellitus, pulmonary embolism, hypertension, COPD with chronic respiratory failure on 3 L, CKD 3, systolic and diastolic CHF. Patient presented to the ED with complaints of chest pain, difficulty breathing generalized weakness.  Patient was recently admitted at [facility] 8/31 - 9/1, for pneumonia and CHF.  Per Care Everywhere, he was treated with IV Rocephin and doxycycline and switched to meropenem, Doxy as it was a concern for aspiration.  Given 80 mg IV Lasix and resumed on home Demadex. Patient requested discharge from the hospital after 1 day hospitalization because he felt better, but daughter at bedside says they left because the medication he was being given did not agree with him.  He was discharged on antibiotics, moxifloxacin and he has taken 6 of the 10 doses prescribed.  Daughter reports that in the hospital they noticed a change in his speech, he was having a some delay in saying his words.  This has persisted to today.  No facial asymmetry.  He generally weak and has not been able to walk, generalized weakness to upper and lower extremities.  No focal deficits.  Ambulates with a cane at baseline.  Daughter also reports intermittent twitching/jerking of his leg that started over the past week since leaving the hospital.  From a respiratory standpoint, he is still having difficulty breathing, this is unchanged from when he was in the hospital, his cough is better. Reports onset of chest pain midsternal, worse with breathing, and intermittent, no known relieving factors.  Described as heaviness.  No history of heart attack, but he  has cardiomyopathy.  Swelling that was present during the hospitalization has resolved.    No fevers.  No vomiting no loose stools.  ED Course: Tmax 98.5.  Heart rate 91-108.  Respiratory rate 18-29.  Blood pressure systolic initially 19/37, improved to 138/64 after fluids.  O2 sats greater than 91% on room air. Chest x-ray showing left greater than right lungs base opacities consistent with bronchopneumonia in combination with chronic bronchiectasis. Troponin 20 > 17. Lactic acid 2.2 > 1.8. BNP 230 improved compared to prior.  2 L bolus given, maintenance fluids started.   Antibiotics Vanco and Zosyn started.  125 mg Solu-Medrol given.  Hospitalist admit for pneumonia.   Review of Systems: As per HPI all other systems reviewed and negative.  Past Medical History:  Diagnosis Date   Asthma    Chronic renal insufficiency    COPD (chronic obstructive pulmonary disease) (HCC)    Heart failure ([place])    a. EF 25-30% in 08/2019 with cath showing normal cors b. EF 20-25% in 09/2020 c. 30-35% in 02/2021 d. EF 20-25% in 10/2021   Hypertension    Pulmonary emboli ([place]) 03/2019   Renal insufficiency    Type 2 diabetes mellitus ([place])     Past Surgical History:  Procedure Laterality Date   APPENDECTOMY     BALLOON DILATION N/A 03/18/2020   Procedure: BALLOON DILATION;  Surgeon: Eloise Harman, DO;  [facility];  Service: Endoscopy;  Laterality: N/A;   CHOLECYSTECTOMY     COLONOSCOPY  10/2006   Dr.Anwar:normal   ESOPHAGEAL BRUSHING  08/24/2021  Procedure: ESOPHAGEAL BRUSHING;  Surgeon: Eloise Harman, DO;  Location: [facility];  Service: Endoscopy;;   ESOPHAGOGASTRODUODENOSCOPY (EGD) WITH PROPOFOL N/A 03/18/2020   Procedure: ESOPHAGOGASTRODUODENOSCOPY (EGD) WITH PROPOFOL;  Surgeon: Eloise Harman, DO;  [facility];  Service: Endoscopy;  Laterality: N/A;  2:30pm   ESOPHAGOGASTRODUODENOSCOPY (EGD) WITH PROPOFOL N/A 08/24/2021   Procedure:  ESOPHAGOGASTRODUODENOSCOPY (EGD) WITH PROPOFOL;  Surgeon: Eloise Harman, DO;  [facility];  Service: Endoscopy;  Laterality: N/A;  with dilation   FLEXIBLE SIGMOIDOSCOPY N/A 08/24/2021   Procedure: FLEXIBLE SIGMOIDOSCOPY;  Surgeon: Eloise Harman, DO;  [facility];  Service: Endoscopy;  Laterality: N/A;   NASAL SINUS SURGERY     NEPHRECTOMY     RIGHT (Question CA.  No further therapy needed.)   RIGHT/LEFT HEART CATH AND CORONARY ANGIOGRAPHY N/A 09/03/2019   Procedure: RIGHT/LEFT HEART CATH AND CORONARY ANGIOGRAPHY;  Surgeon: Lorretta Harp, MD;  Location: [place] [facility];  Service: Cardiovascular;  Laterality: N/A;     reports that he quit smoking about 45 years ago. His smoking use included cigarettes. He started smoking about 64 years ago. He has a 15.00 pack-year smoking history. He quit smokeless tobacco use about 16 years ago.  His smokeless tobacco use included chew. He reports that he does not currently use alcohol. He reports that he does not use drugs.  No Known Allergies  Family History  Problem Relation Age of Onset   Stroke Mother    Breast cancer Mother    Lung cancer Brother    Colon cancer Neg Hx     Prior to Admission medications   Medication Sig Start Date End Date Taking? Authorizing Provider  albuterol (PROVENTIL) (2.5 MG/3ML) 0.083% nebulizer solution 2.5 mg every 4 hours if needed 03/08/22  Yes Tanda Rockers, MD  atorvastatin (LIPITOR) 80 MG tablet Take 80 mg by mouth every evening. 04/10/22  Yes [provider]  budesonide (PULMICORT) 0.25 MG/2ML nebulizer solution Take 2 mLs (0.25 mg total) by nebulization 2 (two) times daily. 01/28/22  Yes Tanda Rockers, MD  dapagliflozin propanediol (FARXIGA) 10 MG TABS tablet Take 1 tablet (10 mg total) by mouth daily before breakfast. 02/25/22  Yes Branch, Alphonse Guild, MD  HM SENNA-S 8.6-50 MG tablet Take 2 tablets by mouth daily. 07/10/21  Yes [provider]   HYDROcodone-acetaminophen (NORCO) 7.5-325 MG tablet Take 1 tablet by mouth 2 (two) times daily as needed for moderate pain. 11/27/20  Yes [provider]  lactulose (CHRONULAC) 10 GM/15ML solution Take 30 mLs (20 g total) by mouth 3 (three) times daily as needed for mild constipation. 03/13/22  Yes Mahala Menghini, PA-C  metoprolol succinate (TOPROL XL) 25 MG 24 hr tablet Take 1 tablet (25 mg total) by mouth daily. 12/18/21  Yes Strader, [place] M, PA-C  mirtazapine (REMERON) 15 MG tablet Take 15 mg by mouth daily.   Yes [provider]  OXYGEN Inhale 4 L into the lungs daily as needed (for shortness of breath).   Yes [provider]  pantoprazole (PROTONIX) 40 MG tablet Take 30- 60 min before your first and last meals of the day 02/24/22  Yes Tanda Rockers, MD  tamsulosin (FLOMAX) 0.4 MG CAPS capsule Take 0.8 mg by mouth 2 (two) times daily.   Yes [provider]  torsemide (DEMADEX) 20 MG tablet Take 1 tablet (20 mg total) by mouth as needed (swelling). 03/23/22  Yes Branch, Alphonse Guild, MD  valsartan (DIOVAN) 80 MG tablet One twice daily 03/08/22  Yes Tanda Rockers, MD  apixaban Arne [place]) 5 MG TABS tablet  02/25/21   [provider]  predniSONE (DELTASONE) 10 MG tablet Take 2 tablets (20 mg total) by mouth 2 (two) times daily. Patient not taking: Reported on 04/14/2022 03/31/22   Veryl Speak, MD  sacubitril-valsartan [facility]) 24-26 MG Take by mouth. 02/04/21 05/09/22  [provider]    Physical Exam: Vitals:   04/14/22 2030 04/14/22 2045 04/14/22 2100 04/14/22 2115  BP: 121/62 113/65 115/63 138/64  Pulse: (!) 104 100 (!) 106 100  Resp: (!) 26 (!) 29 (!) 25 17  Temp:      TempSrc:      SpO2: 96% 95% 91% 95%  Weight:      Height:        Constitutional: NAD, calm, comfortable Vitals:   04/14/22 2030 04/14/22 2045 04/14/22 2100 04/14/22 2115  BP: 121/62 113/65 115/63 138/64  Pulse: (!) 104 100 (!) 106 100  Resp: (!) 26 (!) 29 (!) 25  17  Temp:      TempSrc:      SpO2: 96% 95% 91% 95%  Weight:      Height:       Eyes: PERRL, lids and conjunctivae normal ENMT: Mucous membranes are moist.   Neck: normal, supple, no masses, no thyromegaly Respiratory: clear to auscultation bilaterally, no wheezing, no crackles. Normal respiratory effort. No accessory muscle use.  Cardiovascular: Tachycardic, regular rate and rhythm, no murmurs / rubs / gallops. No extremity edema.  Lower extremities warm.  2+ pedal pulses.   Abdomen: no tenderness, no masses palpated. No hepatosplenomegaly. Bowel sounds positive.  Musculoskeletal: no clubbing / cyanosis. No joint deformity upper and lower extremities.  Skin: no rashes, lesions, ulcers. No induration Neurologic: Speech is fluent without evidence of aphasia, slight delay in answering questions,  Psychiatric: Normal judgment and insight. Alert and oriented x 3. Normal mood.   Labs on Admission: I have personally reviewed following labs and imaging studies  CBC: Recent Labs  Lab 04/14/22 1806  WBC 9.6  HGB 9.2*  HCT 30.7*  MCV 93.6  PLT 580   Basic Metabolic Panel: Recent Labs  Lab 04/14/22 1806  NA 135  K 3.4*  CL 103  CO2 24  GLUCOSE 165*  BUN 25*  CREATININE 2.04*  CALCIUM 7.8*   GFR: Estimated Creatinine Clearance: 24.6 mL/min (A) (by C-G formula based on SCr of 2.04 mg/dL (H)). Liver Function Tests: Recent Labs  Lab 04/14/22 1806  AST 17  ALT 11  ALKPHOS 55  BILITOT 0.2*  PROT 6.5  ALBUMIN 2.9*    Coagulation Profile: Recent Labs  Lab 04/14/22 1806  INR 1.1   Urine analysis:    Component Value Date/Time   COLORURINE STRAW (A) 04/14/2022 1839   APPEARANCEUR CLEAR 04/14/2022 1839   LABSPEC 1.009 04/14/2022 1839   PHURINE 5.0 04/14/2022 1839   GLUCOSEU 150 (A) 04/14/2022 1839   HGBUR SMALL (A) 04/14/2022 1839   BILIRUBINUR NEGATIVE 04/14/2022 1839   [place] NEGATIVE 04/14/2022 1839   PROTEINUR NEGATIVE 04/14/2022 1839   NITRITE NEGATIVE  04/14/2022 1839   LEUKOCYTESUR NEGATIVE 04/14/2022 1839    Radiological Exams on Admission: DG Chest Port 1 View  Result Date: 04/14/2022 CLINICAL DATA:  Cough.  Shortness of breath.  Chest pain. EXAM: PORTABLE CHEST 1 VIEW COMPARISON:  04/08/2022 and 03/31/2022.  CT, 04/08/2022. FINDINGS: Cardiac silhouette is borderline enlarged. No mediastinal or hilar masses. Bronchial  wall thickening with streaky and patchy opacities at the lung bases, most evident on the left. Findings are without change from the prior chest radiograph and CT. Remainder of the lungs is clear. No convincing pleural effusion.  No pneumothorax. Skeletal structures are grossly intact. IMPRESSION: 1. Left greater than right lung base opacities, better appreciated on the recent prior CT, consistent with bronchopneumonia in combination with chronic bronchiectasis. 2. No significant change from the most recent prior studies. Electronically Signed   By: Lajean Manes M.D.   On: 04/14/2022 18:55    EKG: Independently reviewed.  Sinus tachycardia rate 108.  Qtc - 490. Old RBBB.  No significant change from prior.  Assessment/Plan Principal Problem:   PNA (pneumonia) Active Problems:   Type 2 diabetes mellitus ([place])   Essential hypertension   COPD GOLD 3 / obstructive bronchiectasis by HRCT in 2015    Chronic kidney disease, stage 3a (HCC)   Chronic combined systolic and diastolic CHF (congestive heart failure) ([place])   Pulmonary embolism (HCC)   Chronic respiratory failure with hypoxia (HCC)   Assessment and Plan: * PNA (pneumonia) Diagnosed with pneumonia 8/31, hospitalized for 1 day before patient requested to be discharged.  Treated with antibiotics IV meropenem and doxycycline-still is a concern for aspiration, and discharged to complete a course of moxifloxacin which he has taken for 6 days.  Clinically his cough has improved, his dyspnea is the same. On exam, he does not appear dyspneic.  He has COPD with chronic respiratory  failure that is likely contributing to his dyspnea.  -Afebrile without leukocytosis. -Hold off on further antibiotics at this time -Monitor for now -D-dimer if elevated check V/Q  Speech abnormality Daughter reports hesitancy/delay in patient's voice in his words.   He has had intermittent twitching since discharge from hospital 9/1, may be related to quinolone.  Generalized weakness without focal deficits. -Head CT -PT evaluation.  Pulmonary embolism (HCC) History of PE.  He is not taking Eliquis, listed on home med list.  He reports chest pain and dyspnea, he is tachycardic, but this may be related to his hypotension and dehydration.  Troponin and EKG is unremarkable.  Likely from his COPD and dyspnea, -Check D-dimer and if elevated will obtain VQ scan  Chronic combined systolic and diastolic CHF (congestive heart failure) (HCC) Stable and compensated.  Likely hypovolemic at this time.  Considering initial hypotension. Last echo 10/2020 EF of 20 to 25% with grade 2 DD. -Hold diuretics at this time, hold Entresto.  Chronic kidney disease, stage 3a ([place]) CKD stage 3 a - 3 b.  Creatinine today 2, close to what appears to be new baseline.  Gradual uptrend over the past few months from 1.3 >> 1.9.   COPD GOLD 3 / obstructive bronchiectasis by HRCT in 2015  Stable.  With chronic respiratory failure on 3 L.  Benign lung exam. -DuoNebs as needed and scheduled. -125 mg Solu-Medrol given in ED, hold off on further steroids for now.  Essential hypertension Initial hypotension.  Likely from diuretics, antihypertensives, poor oral intake. -2 L bolus given -Hold off on further fluids for now -Hold valsartan, torsemide, Entresto, metoprolol for now.  Type 2 diabetes mellitus (HCC) - Controlled.  A1c 6.2.  -  SSI- s -Hold Farxiga   DVT prophylaxis: heparin Code Status: Full code, confirmed with patient and daughter Apolonio Schneiders at bedside. Family Communication: Daughter Apolonio Schneiders at  bedside. Disposition Plan: ~ 2 days Consults called: None. Admission status:  inpt tele I certify that  at the point of admission it is my clinical judgment that the patient will require inpatient hospital care spanning beyond 2 midnights from the point of admission due to high intensity of service, high risk for further deterioration and high frequency of surveillance required.     Author: Bethena Roys, MD 04/14/2022 10:37 PM  For on call review [email or web address].

## 2022-04-14 NOTE — ED Provider Notes (Signed)
[facility] EMERGENCY DEPARTMENT Provider Note   CSN: [id number] Arrival date & time: 04/14/22  1723     History  Chief Complaint  Patient presents with   Chest Pain    Gregory Mitchell is a 81 y.o. male.   Chest Pain  This patient is an 81 year old male with a history of congestive heart failure followed by Dr. Harl Bowie with cardiology, also has a history of diabetes on Farxiga, hypertension on metoprolol and valsartan and recently admitted to the hospital for CHF exacerbation and pneumonia according to the patient and his family member.  I have reviewed the medical history and it appears that the patient was admitted to [facility], diagnosed with multifocal pneumonia, was given meropenem in the hospital and doxycycline, he was significantly improved and given doxycycline at discharge.  Also seen to be on Demadex for congestive heart failure, this was started on his home medications at discharge as well.  He is normally on 3 L of oxygen for his COPD with chronic respiratory failure, this was continued, the patient refused any further inpatient treatment in that hospital and insisted on discharge, evidently he was "noncompliant with therapy".  The daughter who accompanies him today states that he has been on Avelox since discharge?  According to the medications it looks like he was on levofloxacin at discharge  Review of other hospital laboratory work-up showed his proBNP was close to 5000 sensitivity troponin was 30, a CT angiogram of the chest showed no large or central pulmonary embolism but multifocal pneumonia was present  According to the patient and his significant other he has had progressive weakness and increasing shortness of breath with chest pain when he takes a deep breath and coughs since discharge 5 days ago.    Home Medications Prior to Admission medications   Medication Sig Start Date End Date Taking? Authorizing Provider  albuterol (PROVENTIL) (2.5 MG/3ML) 0.083%  nebulizer solution 2.5 mg every 4 hours if needed 03/08/22  Yes Tanda Rockers, MD  atorvastatin (LIPITOR) 80 MG tablet Take 80 mg by mouth every evening. 04/10/22  Yes [provider]  budesonide (PULMICORT) 0.25 MG/2ML nebulizer solution Take 2 mLs (0.25 mg total) by nebulization 2 (two) times daily. 01/28/22  Yes Tanda Rockers, MD  dapagliflozin propanediol (FARXIGA) 10 MG TABS tablet Take 1 tablet (10 mg total) by mouth daily before breakfast. 02/25/22  Yes Branch, Alphonse Guild, MD  HM SENNA-S 8.6-50 MG tablet Take 2 tablets by mouth daily. 07/10/21  Yes [provider]  HYDROcodone-acetaminophen (NORCO) 7.5-325 MG tablet Take 1 tablet by mouth 2 (two) times daily as needed for moderate pain. 11/27/20  Yes [provider]  lactulose (CHRONULAC) 10 GM/15ML solution Take 30 mLs (20 g total) by mouth 3 (three) times daily as needed for mild constipation. 03/13/22  Yes Mahala Menghini, PA-C  metoprolol succinate (TOPROL XL) 25 MG 24 hr tablet Take 1 tablet (25 mg total) by mouth daily. 12/18/21  Yes Strader, [place] M, PA-C  mirtazapine (REMERON) 15 MG tablet Take 15 mg by mouth daily.   Yes [provider]  OXYGEN Inhale 4 L into the lungs daily as needed (for shortness of breath).   Yes [provider]  pantoprazole (PROTONIX) 40 MG tablet Take 30- 60 min before your first and last meals of the day 02/24/22  Yes Tanda Rockers, MD  tamsulosin (FLOMAX) 0.4 MG CAPS capsule Take 0.8 mg by mouth 2 (two) times daily.   Yes [provider]  torsemide (DEMADEX) 20 MG tablet Take 1 tablet (20 mg total) by mouth as needed (swelling). 03/23/22  Yes Arnoldo Lenis, MD  valsartan (DIOVAN) 80 MG tablet One twice daily 03/08/22  Yes Tanda Rockers, MD  apixaban Arne Cleveland) 5 MG TABS tablet  02/25/21   [provider]  predniSONE (DELTASONE) 10 MG tablet Take 2 tablets (20 mg total) by mouth 2 (two) times daily. Patient not taking: Reported on 04/14/2022  03/31/22   Veryl Speak, MD  sacubitril-valsartan Delene Loll) 24-26 MG Take by mouth. 02/04/21 05/09/22  [provider]      Allergies    Patient has no known allergies.    Review of Systems   Review of Systems  Cardiovascular:  Positive for chest pain.  All other systems reviewed and are negative.   Physical Exam Updated Vital Signs BP 108/68   Pulse 95   Temp 98.5 F (36.9 C) (Axillary)   Resp (!) 26   Ht 1.676 m ('5\' 6"'$ )   Wt 61.2 kg   SpO2 97%   BMI 21.79 kg/m  Physical Exam Vitals and nursing note reviewed.  Constitutional:      General: He is in acute distress.     Appearance: He is well-developed. He is ill-appearing.  HENT:     Head: Normocephalic and atraumatic.     Mouth/Throat:     Mouth: Mucous membranes are dry.     Pharynx: No oropharyngeal exudate.  Eyes:     General: No scleral icterus.       Right eye: No discharge.        Left eye: No discharge.     Conjunctiva/sclera: Conjunctivae normal.     Pupils: Pupils are equal, round, and reactive to light.  Neck:     Thyroid: No thyromegaly.     Vascular: No JVD.  Cardiovascular:     Rate and Rhythm: Regular rhythm. Tachycardia present.     Heart sounds: Normal heart sounds. No murmur heard.    No friction rub. No gallop.  Pulmonary:     Effort: Tachypnea present. No respiratory distress.     Breath sounds: Wheezing, rhonchi and rales present.  Abdominal:     General: Bowel sounds are normal. There is no distension.     Palpations: Abdomen is soft. There is no mass.     Tenderness: There is no abdominal tenderness.  Musculoskeletal:        General: No tenderness. Normal range of motion.     Cervical back: Normal range of motion and neck supple.     Right lower leg: No edema.     Left lower leg: No edema.  Lymphadenopathy:     Cervical: No cervical adenopathy.  Skin:    General: Skin is warm and dry.     Findings: No erythema or rash.  Neurological:     Mental Status: He is alert.      Coordination: Coordination normal.  Psychiatric:        Behavior: Behavior normal.     ED Results / Procedures / Treatments   Labs (all labs ordered are listed, but only abnormal results are displayed) Labs Reviewed  CBC - Abnormal; Notable for the following components:      Result Value   RBC 3.28 (*)    Hemoglobin 9.2 (*)    HCT 30.7 (*)    All other components within normal limits  LACTIC ACID, PLASMA - Abnormal; Notable for the following components:  Lactic Acid, Venous 2.2 (*)    All other components within normal limits  COMPREHENSIVE METABOLIC PANEL - Abnormal; Notable for the following components:   Potassium 3.4 (*)    Glucose, Bld 165 (*)    BUN 25 (*)    Creatinine, Ser 2.04 (*)    Calcium 7.8 (*)    Albumin 2.9 (*)    Total Bilirubin 0.2 (*)    GFR, Estimated 32 (*)    All other components within normal limits  URINALYSIS, ROUTINE W REFLEX MICROSCOPIC - Abnormal; Notable for the following components:   Color, Urine STRAW (*)    Glucose, UA 150 (*)    Hgb urine dipstick SMALL (*)    All other components within normal limits  BRAIN NATRIURETIC PEPTIDE - Abnormal; Notable for the following components:   B Natriuretic Peptide 230.0 (*)    All other components within normal limits  TROPONIN I (HIGH SENSITIVITY) - Abnormal; Notable for the following components:   Troponin I (High Sensitivity) 20 (*)    All other components within normal limits  RESP PANEL BY RT-PCR (FLU A&B, COVID) ARPGX2  CULTURE, BLOOD (ROUTINE X 2)  CULTURE, BLOOD (ROUTINE X 2)  URINE CULTURE  PROTIME-INR  APTT  LACTIC ACID, PLASMA  TROPONIN I (HIGH SENSITIVITY)    EKG EKG Interpretation  Date/Time:  Wednesday April 14 2022 17:32:13 EDT Ventricular Rate:  108 PR Interval:  192 QRS Duration: 134 QT Interval:  366 QTC Calculation: 490 R Axis:   -83 Text Interpretation: Sinus tachycardia Left axis deviation Right bundle branch block Anterior infarct , age undetermined Abnormal  ECG When compared with ECG of 31-Mar-2022 00:38, PREVIOUS ECG IS PRESENT Since last tracing rate faster Confirmed by Noemi Chapel [phone]) on 04/14/2022 5:36:47 PM  Radiology DG Chest Port 1 View  Result Date: 04/14/2022 CLINICAL DATA:  Cough.  Shortness of breath.  Chest pain. EXAM: PORTABLE CHEST 1 VIEW COMPARISON:  04/08/2022 and 03/31/2022.  CT, 04/08/2022. FINDINGS: Cardiac silhouette is borderline enlarged. No mediastinal or hilar masses. Bronchial wall thickening with streaky and patchy opacities at the lung bases, most evident on the left. Findings are without change from the prior chest radiograph and CT. Remainder of the lungs is clear. No convincing pleural effusion.  No pneumothorax. Skeletal structures are grossly intact. IMPRESSION: 1. Left greater than right lung base opacities, better appreciated on the recent prior CT, consistent with bronchopneumonia in combination with chronic bronchiectasis. 2. No significant change from the most recent prior studies. Electronically Signed   By: Lajean Manes M.D.   On: 04/14/2022 18:55    Procedures .Critical Care  Performed by: Noemi Chapel, MD Authorized by: Noemi Chapel, MD   Critical care provider statement:    Critical care time (minutes):  30   Critical care time was exclusive of:  Separately billable procedures and treating other patients and teaching time   Critical care was necessary to treat or prevent imminent or life-threatening deterioration of the following conditions:  Shock and respiratory failure   Critical care was time spent personally by me on the following activities:  Development of treatment plan with patient or surrogate, discussions with consultants, evaluation of patient's response to treatment, examination of patient, ordering and review of laboratory studies, ordering and review of radiographic studies, ordering and performing treatments and interventions, pulse oximetry, re-evaluation of patient's condition, review of  old charts and obtaining history from patient or surrogate   I assumed direction of critical care for this patient from  another provider in my specialty: no     Care discussed with: admitting provider   Comments:           Medications Ordered in ED Medications  lactated ringers infusion ( Intravenous New Bag/Given 04/14/22 1950)  lactated ringers bolus 1,000 mL (1,000 mLs Intravenous New Bag/Given 04/14/22 1803)    And  lactated ringers bolus 1,000 mL (1,000 mLs Intravenous New Bag/Given 04/14/22 1950)  albuterol (PROVENTIL,VENTOLIN) solution continuous neb (10 mg Nebulization Not Given 04/14/22 1939)  vancomycin (VANCOCIN) IVPB 1000 mg/200 mL premix (has no administration in time range)  piperacillin-tazobactam (ZOSYN) IVPB 3.375 g (3.375 g Intravenous New Bag/Given 04/14/22 1950)  methylPREDNISolone sodium succinate (SOLU-MEDROL) 125 mg/2 mL injection 125 mg (125 mg Intravenous Given 04/14/22 1950)  albuterol (PROVENTIL) (2.5 MG/3ML) 0.083% nebulizer solution (10 mg  Given 04/14/22 1939)    ED Course/ Medical Decision Making/ A&P                           Medical Decision Making Amount and/or Complexity of Data Reviewed Labs: ordered. Radiology: ordered.  Risk Prescription drug management. Decision regarding hospitalization.   This patient presents to the ED for concern of increasing shortness of breath with hypotension and chest pain and weakness, this involves an extensive number of treatment options, and is a complaint that carries with it a high risk of complications and morbidity.  The differential diagnosis includes sepsis, cardiogenic shock, MI, aspiration, less likely to be pulmonary embolism given recent CT angiogram   Co morbidities that complicate the patient evaluation  Chronic COPD and respiratory failure   Additional history obtained:  Additional history obtained from medical record as noted above in the history External records from outside source obtained and  reviewed including prior admission and discharge summaries   Lab Tests:  I Ordered, and personally interpreted labs.  The pertinent results include: Actiq acid of 2.2, no leukocytosis, mild anemia but better over time, metabolic panel showing renal insufficiency similar to prior levels drawn a week ago   Imaging Studies ordered:  I ordered imaging studies including chest x-ray I independently visualized and interpreted imaging which showed distended bibasilar infiltrates I agree with the radiologist interpretation   Cardiac Monitoring: / EKG:  The patient was maintained on a cardiac monitor.  I personally viewed and interpreted the cardiac monitored which showed an underlying rhythm of: Sinus tachycardia gradually improving   Consultations Obtained:  I requested consultation with the hospitalist Dr. Denton Brick,  and discussed lab and imaging findings as well as pertinent plan - they recommend: Admission to the hospital   Problem List / ED Course / Critical interventions / Medication management  Attentional ongoing pneumonia, the patient was hypotensive initially and required fluid resuscitation.  I suspect this is in large part secondary to ongoing infection, currently only on fluoroquinolone treatment I ordered medication including vancomycin Zosyn and IV fluids for hypotension and infection Reevaluation of the patient after these medicines showed that the patient improved but critically ill I have reviewed the patients home medicines and have made adjustments as needed   Social Determinants of Health:  Chronic COPD and respiratory failure   Test / Admission - Considered:  We will admit to hospitalist         Final Clinical Impression(s) / ED Diagnoses Final diagnoses:  Acute respiratory failure with hypoxia ([place])  COPD exacerbation ([facility])     Noemi Chapel, MD 04/14/22 2001

## 2022-04-14 NOTE — Assessment & Plan Note (Signed)
Initial hypotension.  Likely from diuretics, antihypertensives, poor oral intake. -2 L bolus given -Hold off on further fluids for now -Hold valsartan, torsemide, Entresto, metoprolol for now.

## 2022-04-15 ENCOUNTER — Encounter: Payer: Self-pay | Admitting: Internal Medicine

## 2022-04-15 ENCOUNTER — Inpatient Hospital Stay (HOSPITAL_COMMUNITY): Payer: Medicare HMO

## 2022-04-15 DIAGNOSIS — I5042 Chronic combined systolic (congestive) and diastolic (congestive) heart failure: Secondary | ICD-10-CM | POA: Diagnosis not present

## 2022-04-15 DIAGNOSIS — N1831 Chronic kidney disease, stage 3a: Secondary | ICD-10-CM | POA: Diagnosis not present

## 2022-04-15 DIAGNOSIS — E1100 Type 2 diabetes mellitus with hyperosmolarity without nonketotic hyperglycemic-hyperosmolar coma (NKHHC): Secondary | ICD-10-CM

## 2022-04-15 DIAGNOSIS — I2699 Other pulmonary embolism without acute cor pulmonale: Secondary | ICD-10-CM | POA: Diagnosis not present

## 2022-04-15 DIAGNOSIS — J189 Pneumonia, unspecified organism: Secondary | ICD-10-CM | POA: Diagnosis not present

## 2022-04-15 LAB — BASIC METABOLIC PANEL
Anion gap: 5 (ref 5–15)
BUN: 21 mg/dL (ref 8–23)
CO2: 25 mmol/L (ref 22–32)
Calcium: 7.7 mg/dL — ABNORMAL LOW (ref 8.9–10.3)
Chloride: 107 mmol/L (ref 98–111)
Creatinine, Ser: 1.61 mg/dL — ABNORMAL HIGH (ref 0.61–1.24)
GFR, Estimated: 43 mL/min — ABNORMAL LOW (ref >60)
Glucose, Bld: 201 mg/dL — ABNORMAL HIGH (ref 70–99)
Potassium: 4.3 mmol/L (ref 3.5–5.1)
Sodium: 137 mmol/L (ref 135–145)

## 2022-04-15 LAB — URINE CULTURE: Culture: <10000 — AB

## 2022-04-15 LAB — CBC
HCT: 28.8 % — ABNORMAL LOW (ref 39.0–52.0)
Hemoglobin: 8.6 g/dL — ABNORMAL LOW (ref 13.0–17.0)
MCH: 27.6 pg (ref 26.0–34.0)
MCHC: 29.9 g/dL — ABNORMAL LOW (ref 30.0–36.0)
MCV: 92.3 fL (ref 80.0–100.0)
Platelets: 252 10*3/uL (ref 150–400)
RBC: 3.12 MIL/uL — ABNORMAL LOW (ref 4.22–5.81)
RDW: 13.6 % (ref 11.5–15.5)
WBC: 7.2 10*3/uL (ref 4.0–10.5)
nRBC: 0 % (ref 0.0–0.2)

## 2022-04-15 LAB — GLUCOSE, CAPILLARY
Glucose-Capillary: 167 mg/dL — ABNORMAL HIGH (ref 70–99)
Glucose-Capillary: 103 mg/dL — ABNORMAL HIGH (ref 70–99)
Glucose-Capillary: 206 mg/dL — ABNORMAL HIGH (ref 70–99)
Glucose-Capillary: 150 mg/dL — ABNORMAL HIGH (ref 70–99)

## 2022-04-15 MED ORDER — IPRATROPIUM-ALBUTEROL 0.5-2.5 (3) MG/3ML IN SOLN
3.0000 mL | Freq: Three times a day (TID) | RESPIRATORY_TRACT | Status: DC
  Administered 2022-04-15: 3 mL via RESPIRATORY_TRACT
  Filled 2022-04-15: qty 3

## 2022-04-15 MED ORDER — METHYLPREDNISOLONE SODIUM SUCC 40 MG IJ SOLR
40.0000 mg | Freq: Two times a day (BID) | INTRAMUSCULAR | Status: DC
  Administered 2022-04-15 – 2022-04-16 (×3): 40 mg via INTRAVENOUS
  Filled 2022-04-15 (×3): qty 1

## 2022-04-15 MED ORDER — ALPRAZOLAM 0.5 MG PO TABS
0.5000 mg | ORAL_TABLET | Freq: Two times a day (BID) | ORAL | Status: DC | PRN
  Administered 2022-04-15 – 2022-04-16 (×2): 0.5 mg via ORAL
  Filled 2022-04-15 (×2): qty 1

## 2022-04-15 MED ORDER — TECHNETIUM TO 99M ALBUMIN AGGREGATED
4.4000 | Freq: Once | INTRAVENOUS | Status: AC | PRN
  Administered 2022-04-15: 4.4 via INTRAVENOUS

## 2022-04-15 MED ORDER — ALBUTEROL SULFATE (2.5 MG/3ML) 0.083% IN NEBU: 2.5000 mg | INHALATION_SOLUTION | RESPIRATORY_TRACT | Status: DC | PRN

## 2022-04-15 MED ORDER — IPRATROPIUM-ALBUTEROL 0.5-2.5 (3) MG/3ML IN SOLN
3.0000 mL | Freq: Three times a day (TID) | RESPIRATORY_TRACT | Status: DC
  Administered 2022-04-15 – 2022-04-16 (×3): 3 mL via RESPIRATORY_TRACT
  Filled 2022-04-15 (×3): qty 3

## 2022-04-15 MED ORDER — AMOXICILLIN-POT CLAVULANATE 875-125 MG PO TABS
1.0000 | ORAL_TABLET | Freq: Two times a day (BID) | ORAL | Status: DC
  Administered 2022-04-15 – 2022-04-16 (×3): 1 via ORAL
  Filled 2022-04-15 (×3): qty 1

## 2022-04-15 MED ORDER — MELATONIN 3 MG PO TABS
6.0000 mg | ORAL_TABLET | Freq: Once | ORAL | Status: AC
  Administered 2022-04-15: 6 mg via ORAL
  Filled 2022-04-15: qty 2

## 2022-04-15 NOTE — Progress Notes (Signed)
PROGRESS NOTE     Gregory Mitchell, is a 81 y.o. male, DOB - [birth year], OZD:[id number]  Admit date - 04/14/2022   Admitting Physician Ejiroghene Arlyce Dice, MD  Outpatient Primary MD for the patient is Beola Cord, [place]  LOS - 1  Chief Complaint  Patient presents with   Chest Pain        -Assessment and Plan: 1) acute COPD exacerbation due to PNA (pneumonia) in the setting of underlying bronchiectasis -At baseline patient has COPD GOLD 3 / obstructive bronchiectasis by HRCT in 2015  --patient was treated with 6 days of Avelox recently -Respiratory symptoms including cough dyspnea and increased oxygen requirement persist -D-dimer elevated however VQ scan negative for PE -Bronchodilators, mucolytics IV Solu-Medrol mucolytics as ordered -Augmentin given concerns for pneumonia in the setting of underlying bronchiectasis having failed Avelox recently  Acute on chronic hypoxic respiratory failure-- Oxygen requirement currently higher than baseline -At baseline patient uses up to 3 L of oxygen at home mostly at night -Anticipate improvement with treatment of #1 above -Currently requiring 5 L of oxygen  Speech abnormality Daughter reports hesitancy/delay in patient's voice in his words.   He has had intermittent twitching since discharge from hospital 9/1, may be related to quinolone.  Generalized weakness without focal deficits. -CT head without acute findings -Speech overall improved -PT eval appreciated no further PT needs  Chronic combined systolic and diastolic CHF (congestive heart failure) (HCC) Stable and compensated.  Last echo 10/2020 EF of 20 to 25% with grade 2 DD. -Hold diuretics at this time, hold Entresto. -May restart diuretics and Entresto upon discharge  Chronic kidney disease, stage 3a  Renal function appears close to baseline  renally adjust medications, avoid nephrotoxic agents / dehydration  / hypotension  HTN -BP improving -Continue to hold valsartan,  torsemide, Entresto, metoprolol for now.  Type 2 diabetes mellitus (HCC) -=-Last A1c 6.2 reflecting excellent diabetic control PTA -Hold Farxiga Use Novolog/Humalog Sliding scale insulin with Accu-Cheks/Fingersticks as ordered   Generalized weakness/deconditioning----PT eval appreciated,   Disposition/Need for in-Hospital Stay- patient unable to be discharged at this time due to - Acute COPD exacerbation in setting of pneumonia and bronchiectasis requiring increased oxygen requirement as well as antibiotics and steroids  Status is: Inpatient   Disposition: The patient is from: Home              Anticipated d/c is to: Home              Anticipated d/c date is: 1 day              Patient currently is not medically stable to d/c. Barriers: Not Clinically Stable-   Code Status :  -  Code Status: Full Code   Family Communication:    NA (patient is alert, awake and coherent)   DVT Prophylaxis  :   - SCDs   heparin injection 5,000 Units Start: 04/14/22 2330   Lab Results  Component Value Date   PLT 252 04/15/2022    Inpatient Medications  Scheduled Meds:  amoxicillin-clavulanate  1 tablet Oral Q12H   atorvastatin  80 mg Oral QPM   budesonide  0.25 mg Nebulization BID   heparin  5,000 Units Subcutaneous Q8H   insulin aspart  0-5 Units Subcutaneous QHS   insulin aspart  0-9 Units Subcutaneous TID WC   ipratropium-albuterol  3 mL Nebulization TID   methylPREDNISolone (SOLU-MEDROL) injection  40 mg Intravenous Q12H   pantoprazole  40 mg Oral BID AC  Continuous Infusions: PRN Meds:.acetaminophen **OR** acetaminophen, albuterol, ALPRAZolam, guaiFENesin-dextromethorphan, HYDROcodone-acetaminophen, lactulose, polyethylene glycol   Anti-infectives (From admission, onward)    Start     Dose/Rate Route Frequency Ordered Stop   04/15/22 1000  amoxicillin-clavulanate (AUGMENTIN) 875-125 MG per tablet 1 tablet        1 tablet Oral Every 12 hours 04/15/22 0811     04/14/22 1945   vancomycin (VANCOCIN) IVPB 1000 mg/200 mL premix        1,000 mg 200 mL/hr over 60 Minutes Intravenous  Once 04/14/22 1937 04/14/22 2229   04/14/22 1945  piperacillin-tazobactam (ZOSYN) IVPB 3.375 g        3.375 g 100 mL/hr over 30 Minutes Intravenous  Once 04/14/22 1937 04/14/22 2020         Subjective: Gregory Mitchell today has no fevers, no emesis,  No further chest pain,   -Cough and shortness of breath persist -Dyspnea on exertion persist -Oxygen requirement higher than baseline   Objective: Vitals:   04/15/22 1300 04/15/22 1431 04/15/22 1450 04/15/22 1747  BP:  108/72  93/63  Pulse:  (!) 109 93 (!) 102  Resp:  (!) 24 (!) 22 20  Temp:  99.4 F (37.4 C)  99.1 F (37.3 C)  TempSrc:  Oral  Oral  SpO2: 93% 100%  100%  Weight:      Height:        Intake/Output Summary (Last 24 hours) at 04/15/2022 1804 Last data filed at 04/15/2022 1700 Gross per 24 hour  Intake 480 ml  Output 700 ml  Net -220 ml   Filed Weights   04/14/22 1729 04/14/22 2214  Weight: 61.2 kg 62.7 kg    Physical Exam  Gen:- Awake Alert, unable to speak in sentences with ambulation HEENT:- [place].AT, No sclera icterus Nose- [place] 5L/min Neck-Supple Neck,No JVD,.  Lungs-diminished breath sounds scattered wheezes and rhonchi bilaterally  CV- S1, S2 normal, irregular regular  Abd-  +ve B.Sounds, Abd Soft, No tenderness,    Extremity/Skin:- No  edema, pedal pulses present  Psych-affect is appropriate, oriented x3 Neuro-denies weakness, no new focal deficits, no tremors  Data Reviewed: I have personally reviewed following labs and imaging studies  CBC: Recent Labs  Lab 04/14/22 1806 04/15/22 0308  WBC 9.6 7.2  HGB 9.2* 8.6*  HCT 30.7* 28.8*  MCV 93.6 92.3  PLT 265 536   Basic Metabolic Panel: Recent Labs  Lab 04/14/22 1806 04/15/22 0308  NA 135 137  K 3.4* 4.3  CL 103 107  CO2 24 25  GLUCOSE 165* 201*  BUN 25* 21  CREATININE 2.04* 1.61*  CALCIUM 7.8* 7.7*   GFR: Estimated Creatinine  Clearance: 31.9 mL/min (A) (by C-G formula based on SCr of 1.61 mg/dL (H)). Liver Function Tests: Recent Labs  Lab 04/14/22 1806  AST 17  ALT 11  ALKPHOS 55  BILITOT 0.2*  PROT 6.5  ALBUMIN 2.9*   Cardiac Enzymes: No results for input(s): "CKTOTAL", "CKMB", "CKMBINDEX", "TROPONINI" in the last 168 hours. BNP (last 3 results) No results for input(s): "PROBNP" in the last 8760 hours. HbA1C: No results for input(s): "HGBA1C" in the last 72 hours. Sepsis Labs: '@LABRCNTIP'$ (procalcitonin:4,lacticidven:4) ) Recent Results (from the past 240 hour(s))  Blood Culture (routine x 2)     Status: None (Preliminary result)   Collection Time: 04/14/22  5:54 PM   Specimen: BLOOD RIGHT ARM  Result Value Ref Range Status   Specimen Description BLOOD RIGHT ARM  Final   Special Requests   Final  BOTTLES DRAWN AEROBIC AND ANAEROBIC Blood Culture adequate volume   Culture   Final    NO GROWTH < 24 HOURS Performed at [facility], [address] [place], [place] [address]    Report Status PENDING  Incomplete  Resp Panel by RT-PCR (Flu A&B, Covid) Anterior Nasal Swab     Status: None   Collection Time: 04/14/22  6:04 PM   Specimen: Anterior Nasal Swab  Result Value Ref Range Status   SARS Coronavirus 2 by RT PCR NEGATIVE NEGATIVE Final    Comment: (NOTE) SARS-CoV-2 target nucleic acids are NOT DETECTED.  The SARS-CoV-2 RNA is generally detectable in upper respiratory specimens during the acute phase of infection. The lowest concentration of SARS-CoV-2 viral copies this assay can detect is 138 copies/mL. A negative result does not preclude SARS-Cov-2 infection and should not be used as the sole basis for treatment or other patient management decisions. A negative result may occur with  improper specimen collection/handling, submission of specimen other than nasopharyngeal swab, presence of viral mutation(s) within the areas targeted by this assay, and inadequate number of viral copies(<138  copies/mL). A negative result must be combined with clinical observations, patient history, and epidemiological information. The expected result is Negative.  Fact Sheet for Patients:  EntrepreneurPulse.com.au  Fact Sheet for Healthcare Providers:  IncredibleEmployment.be  This test is no t yet approved or cleared by the [place] FDA and  has been authorized for detection and/or diagnosis of SARS-CoV-2 by FDA under an Emergency Use Authorization (EUA). This EUA will remain  in effect (meaning this test can be used) for the duration of the COVID-19 declaration under Section 564(b)(1) of the Act, 21 U.S.C.section 360bbb-3(b)(1), unless the authorization is terminated  or revoked sooner.       Influenza A by PCR NEGATIVE NEGATIVE Final   Influenza B by PCR NEGATIVE NEGATIVE Final    Comment: (NOTE) The Xpert Xpress SARS-CoV-2/FLU/RSV plus assay is intended as an aid in the diagnosis of influenza from Nasopharyngeal swab specimens and should not be used as a sole basis for treatment. Nasal washings and aspirates are unacceptable for Xpert Xpress SARS-CoV-2/FLU/RSV testing.  Fact Sheet for Patients: EntrepreneurPulse.com.au  Fact Sheet for Healthcare Providers: IncredibleEmployment.be  This test is not yet approved or cleared by the [place] FDA and has been authorized for detection and/or diagnosis of SARS-CoV-2 by FDA under an Emergency Use Authorization (EUA). This EUA will remain in effect (meaning this test can be used) for the duration of the COVID-19 declaration under Section 564(b)(1) of the Act, 21 U.S.C. section 360bbb-3(b)(1), unless the authorization is terminated or revoked.  Performed at [facility], [address] [place], [place] [address]   Blood Culture (routine x 2)     Status: None (Preliminary result)   Collection Time: 04/14/22  6:06 PM   Specimen: BLOOD LEFT ARM  Result  Value Ref Range Status   Specimen Description BLOOD LEFT ARM  Final   Special Requests   Final    BOTTLES DRAWN AEROBIC AND ANAEROBIC Blood Culture adequate volume   Culture   Final    NO GROWTH < 24 HOURS Performed at [facility], [address] [place], [place] [address]    Report Status PENDING  Incomplete      Radiology Studies: NM Pulmonary Perfusion  Result Date: 04/15/2022 CLINICAL DATA:  Cough, shortness of breath, chest pain, dyspnea, elevated D-dimer, symptoms for 2 weeks EXAM: NUCLEAR MEDICINE PERFUSION LUNG SCAN TECHNIQUE: Perfusion images were obtained in  multiple projections after intravenous injection of radiopharmaceutical. Ventilation scans intentionally deferred if perfusion scan and chest x-ray adequate for interpretation during COVID 19 epidemic. RADIOPHARMACEUTICALS:  4.4 mCi Tc-20mMAA IV COMPARISON:  Chest radiograph 04/14/2022 FINDINGS: No wedge-shaped perfusion defects are identified to suggest pulmonary embolism. Mild nonsegmental irregularity of perfusion in the periphery of the lungs bilaterally corresponding to COPD changes and bibasilar atelectasis on prior chest radiograph. IMPRESSION: Pulmonary embolism absent. Electronically Signed   By: MLavonia DanaM.D.   On: 04/15/2022 08:53   CT HEAD WO CONTRAST (5MM)  Result Date: 04/14/2022 CLINICAL DATA:  Change in speech 6 days ago. EXAM: CT HEAD WITHOUT CONTRAST TECHNIQUE: Contiguous axial images were obtained from the base of the skull through the vertex without intravenous contrast. RADIATION DOSE REDUCTION: This exam was performed according to the departmental dose-optimization program which includes automated exposure control, adjustment of the mA and/or kV according to patient size and/or use of iterative reconstruction technique. COMPARISON:  February 27, 2022 FINDINGS: Brain: There is mild cerebral atrophy with widening of the extra-axial spaces and ventricular dilatation. There are areas of decreased attenuation within  the white matter tracts of the supratentorial brain, consistent with microvascular disease changes. Vascular: No hyperdense vessel or unexpected calcification. Skull: Normal. Negative for fracture or focal lesion. Sinuses/Orbits: There is marked severity bilateral maxillary sinus and bilateral ethmoid sinus mucosal thickening. Other: None. IMPRESSION: Generalized cerebral atrophy and chronic white matter small vessel ischemic changes without evidence of an acute intracranial abnormality. Electronically Signed   By: TVirgina NorfolkM.D.   On: 04/14/2022 22:39   DG Chest Port 1 View  Result Date: 04/14/2022 CLINICAL DATA:  Cough.  Shortness of breath.  Chest pain. EXAM: PORTABLE CHEST 1 VIEW COMPARISON:  04/08/2022 and 03/31/2022.  CT, 04/08/2022. FINDINGS: Cardiac silhouette is borderline enlarged. No mediastinal or hilar masses. Bronchial wall thickening with streaky and patchy opacities at the lung bases, most evident on the left. Findings are without change from the prior chest radiograph and CT. Remainder of the lungs is clear. No convincing pleural effusion.  No pneumothorax. Skeletal structures are grossly intact. IMPRESSION: 1. Left greater than right lung base opacities, better appreciated on the recent prior CT, consistent with bronchopneumonia in combination with chronic bronchiectasis. 2. No significant change from the most recent prior studies. Electronically Signed   By: DLajean ManesM.D.   On: 04/14/2022 18:55     Scheduled Meds:  amoxicillin-clavulanate  1 tablet Oral Q12H   atorvastatin  80 mg Oral QPM   budesonide  0.25 mg Nebulization BID   heparin  5,000 Units Subcutaneous Q8H   insulin aspart  0-5 Units Subcutaneous QHS   insulin aspart  0-9 Units Subcutaneous TID WC   ipratropium-albuterol  3 mL Nebulization TID   methylPREDNISolone (SOLU-MEDROL) injection  40 mg Intravenous Q12H   pantoprazole  40 mg Oral BID AC   Continuous Infusions:   LOS: 1 day    CRoxan HockeyM.D  on 04/15/2022 at 6:04 PM  Go to [email or web address] - for contact info  [facility] - Office  [phone] If 7PM-7AM, please contact night-coverage [email or web address] 04/15/2022, 6:04 PM

## 2022-04-15 NOTE — TOC Progression Note (Signed)
  Transition of Care [facility]) Screening Note   Patient Details  Name: Gregory Mitchell Date of Birth: Aug 13, 1940   Transition [facility] Care [facility]) CM/SW Contact:    Boneta Lucks, RN Phone Number: 04/15/2022, 10:15 AM  R/O PE - PT now follow up recommended..    Transition of Care Department [facility]) has reviewed patient and no TOC needs have been identified at this time. We will continue to monitor patient advancement through interdisciplinary progression rounds. If new patient transition needs arise, please place a TOC consult.        Expected Discharge Plan: Home/Self Care Barriers to Discharge: Continued Medical Work up  Expected Discharge Plan and Services Expected Discharge Plan: Home/Self Care       Living arrangements for the past 2 months: [place]

## 2022-04-15 NOTE — Progress Notes (Signed)
   04/15/22 1747  Vitals  Temp 99.1 F (37.3 C)  Temp Source Oral  BP 93/63  MAP (mmHg) 74  Pulse Rate (!) 102  Resp 20  MEWS COLOR  MEWS Score Color Yellow  Oxygen Therapy  SpO2 100 %  O2 Device Nasal Cannula  O2 Flow Rate (L/min) 2 L/min  MEWS Score  MEWS Temp 0  MEWS Systolic 1  MEWS Pulse 1  MEWS RR 0  MEWS LOC 0  MEWS Score 2  Provider Notification  Provider Name/Title Dr Joesph Fillers  Date Provider Notified 04/15/22  Time Provider Notified [id number]  Method of Notification Page (secure chat)  Notification Reason Other (Comment) (hr 102)  Provider response No new orders

## 2022-04-15 NOTE — Plan of Care (Signed)
  Problem: Acute Rehab PT Goals(only PT should resolve) Goal: Pt Will Go Supine/Side To Sit Outcome: Progressing Flowsheets (Taken 04/15/2022 1230) Pt will go Supine/Side to Sit:  Independently  with modified independence Goal: Patient Will Transfer Sit To/From Stand Outcome: Progressing Flowsheets (Taken 04/15/2022 1230) Patient will transfer sit to/from stand: with modified independence Goal: Pt Will Transfer Bed To Chair/Chair To Bed Outcome: Progressing Flowsheets (Taken 04/15/2022 1230) Pt will Transfer Bed to Chair/Chair to Bed: with modified independence Goal: Pt Will Ambulate Outcome: Progressing Flowsheets (Taken 04/15/2022 1230) Pt will Ambulate:  100 feet  with modified independence  with cane   12:31 PM, 04/15/22 Lonell Grandchild, MPT Physical Therapist with [facility] [phone] office [phone] mobile phone

## 2022-04-15 NOTE — Evaluation (Signed)
Physical Therapy Evaluation Patient Details Name: Gregory Mitchell MRN: [id number] DOB: 1940-10-04 Today's Date: 04/15/2022  History of Present Illness  Gregory Mitchell is a 81 y.o. male with medical history significant for diabetes mellitus, pulmonary embolism, hypertension, COPD with chronic respiratory failure on 3 L, CKD 3, systolic and diastolic CHF.  Patient presented to the ED with complaints of chest pain, difficulty breathing generalized weakness.     Patient was recently admitted at [facility] 8/31 - 9/1, for pneumonia and CHF.  Per Care Everywhere, he was treated with IV Rocephin and doxycycline and switched to meropenem, Doxy as it was a concern for aspiration.  Given 80 mg IV Lasix and resumed on home Demadex. Patient requested discharge from the hospital after 1 day hospitalization because he felt better, but daughter at bedside says they left because the medication he was being given did not agree with him.  He was discharged on antibiotics, moxifloxacin and he has taken 6 of the 10 doses prescribed.     Daughter reports that in the hospital they noticed a change in his speech, he was having a some delay in saying his words.  This has persisted to today.  No facial asymmetry.  He generally weak and has not been able to walk, generalized weakness to upper and lower extremities.  No focal deficits.  Ambulates with a cane at baseline.  Daughter also reports intermittent twitching/jerking of his leg that started over the past week since leaving the hospital.     From a respiratory standpoint, he is still having difficulty breathing, this is unchanged from when he was in the hospital, his cough is better.  Reports onset of chest pain midsternal, worse with breathing, and intermittent, no known relieving factors.  Described as heaviness.  No history of heart attack, but he has cardiomyopathy.  Swelling that was present during the hospitalization has resolved.     No fevers.  No vomiting no loose  stools.   Clinical Impression  Patient demonstrates slightly labored movement for sitting up at bedside and transferring to chair, good return for ambulating in room and hallway using his SPC with mostly step-to pattern without loss of balance, on room air when walking with SpO2 at 95% and tolerated sitting up in chair after therapy.  Patient will benefit from continued skilled physical therapy in hospital and recommended venue below to increase strength, balance, endurance for safe ADLs and gait.        Recommendations for follow up therapy are one component of a multi-disciplinary discharge planning process, led by the attending physician.  Recommendations may be updated based on patient status, additional functional criteria and insurance authorization.  Follow Up Recommendations No PT follow up      Assistance Recommended at Discharge Set up Supervision/Assistance  Patient can return home with the following  A little help with walking and/or transfers;A little help with bathing/dressing/bathroom;Help with stairs or ramp for entrance;Assistance with cooking/housework    Equipment Recommendations None recommended by PT  Recommendations for Other Services       Functional Status Assessment Patient has had a recent decline in their functional status and demonstrates the ability to make significant improvements in function in a reasonable and predictable amount of time.     Precautions / Restrictions Precautions Precautions: Fall Restrictions Weight Bearing Restrictions: No      Mobility  Bed Mobility Overal bed mobility: Modified Independent  Transfers Overall transfer level: Needs assistance   Transfers: Sit to/from Stand, Bed to chair/wheelchair/BSC Sit to Stand: Modified independent (Device/Increase time), Supervision   Step pivot transfers: Supervision, Modified independent (Device/Increase time)       General transfer comment: slightly labored  movement    Ambulation/Gait Ambulation/Gait assistance: Supervision, Modified independent (Device/Increase time) Gait Distance (Feet): 75 Feet Assistive device: Straight cane Gait Pattern/deviations: Step-to pattern, Decreased step length - right, Decreased step length - left, Decreased stride length Gait velocity: decreased     General Gait Details: slightly labored cadence without loss of balance demonstrating good return for using SPC ambulating in room/hallway with mostly step-to pattern  Stairs            Wheelchair Mobility    Modified Rankin (Stroke Patients Only)       Balance Overall balance assessment: Needs assistance Sitting-balance support: Feet supported, No upper extremity supported Sitting balance-Leahy Scale: Good Sitting balance - Comments: seated at EOB   Standing balance support: During functional activity, No upper extremity supported Standing balance-Leahy Scale: Fair Standing balance comment: fair/good using SPC                             Pertinent Vitals/Pain Pain Assessment Pain Assessment: 0-10 Pain Score: 2  Pain Location: chest Pain Descriptors / Indicators: Sore Pain Intervention(s): Limited activity within patient's tolerance, Monitored during session    Home Living Family/patient expects to be discharged to:: Private residence Living Arrangements: Children Available Help at Discharge: Family;Available 24 hours/day Type of Home: House Home Access: Level entry       Home Layout: One level Home Equipment: Hand held shower head;Grab bars - tub/shower;Cane - single Barista (2 wheels);Wheelchair - Brewing technologist      Prior Function Prior Level of Function : Independent/Modified Independent             Mobility Comments: Hydrographic surveyor without AD, drives ADLs Comments: Independent     Hand Dominance   Dominant Hand: Right    Extremity/Trunk Assessment   Upper Extremity  Assessment Upper Extremity Assessment: Overall WFL for tasks assessed    Lower Extremity Assessment Lower Extremity Assessment: Overall WFL for tasks assessed    Cervical / Trunk Assessment Cervical / Trunk Assessment: Kyphotic  Communication   Communication: No difficulties  Cognition Arousal/Alertness: Awake/alert Behavior During Therapy: WFL for tasks assessed/performed Overall Cognitive Status: Within Functional Limits for tasks assessed                                          General Comments      Exercises     Assessment/Plan    PT Assessment Patient needs continued PT services  PT Problem List Decreased strength;Decreased activity tolerance;Decreased balance;Decreased mobility       PT Treatment Interventions DME instruction;Gait training;Stair training;Functional mobility training;Therapeutic activities;Therapeutic exercise;Balance training    PT Goals (Current goals can be found in the Care Plan section)  Acute Rehab PT Goals Patient Stated Goal: return home with family to assist PT Goal Formulation: With patient Time For Goal Achievement: 04/17/22 Potential to Achieve Goals: Good    Frequency Min 2X/week     Co-evaluation               AM-PAC PT "6 Clicks" Mobility  Outcome Measure Help needed turning from your back to  your side while in a flat bed without using bedrails?: None Help needed moving from lying on your back to sitting on the side of a flat bed without using bedrails?: None Help needed moving to and from a bed to a chair (including a wheelchair)?: A Little Help needed standing up from a chair using your arms (e.g., wheelchair or bedside chair)?: A Little Help needed to walk in hospital room?: A Little Help needed climbing 3-5 steps with a railing? : A Little 6 Click Score: 20    End of Session   Activity Tolerance: Patient tolerated treatment well;Patient limited by fatigue Patient left: in chair;with call  bell/phone within reach Nurse Communication: Mobility status PT Visit Diagnosis: Unsteadiness on feet (R26.81);Other abnormalities of gait and mobility (R26.89);Muscle weakness (generalized) (M62.81)    Time: 6389-3734 PT Time Calculation (min) (ACUTE ONLY): 27 min   Charges:   PT Evaluation $PT Eval Moderate Complexity: 1 Mod PT Treatments $Therapeutic Activity: 23-37 mins        12:29 PM, 04/15/22 Lonell Grandchild, MPT Physical Therapist with [facility] [phone] office [phone] mobile phone

## 2022-04-16 ENCOUNTER — Telehealth: Payer: Self-pay | Admitting: Internal Medicine

## 2022-04-16 DIAGNOSIS — N1831 Chronic kidney disease, stage 3a: Secondary | ICD-10-CM | POA: Diagnosis not present

## 2022-04-16 DIAGNOSIS — J189 Pneumonia, unspecified organism: Secondary | ICD-10-CM | POA: Diagnosis not present

## 2022-04-16 DIAGNOSIS — J9611 Chronic respiratory failure with hypoxia: Secondary | ICD-10-CM | POA: Diagnosis not present

## 2022-04-16 DIAGNOSIS — I5042 Chronic combined systolic (congestive) and diastolic (congestive) heart failure: Secondary | ICD-10-CM | POA: Diagnosis not present

## 2022-04-16 LAB — GLUCOSE, CAPILLARY
Glucose-Capillary: 170 mg/dL — ABNORMAL HIGH (ref 70–99)
Glucose-Capillary: 158 mg/dL — ABNORMAL HIGH (ref 70–99)

## 2022-04-16 MED ORDER — GUAIFENESIN ER 600 MG PO TB12: 600.0000 mg | ORAL_TABLET | Freq: Two times a day (BID) | ORAL | 2 refills | Status: DC

## 2022-04-16 MED ORDER — PREDNISONE 20 MG PO TABS: 40.0000 mg | ORAL_TABLET | Freq: Every day | ORAL | 0 refills | Status: AC

## 2022-04-16 MED ORDER — ALBUTEROL SULFATE (2.5 MG/3ML) 0.083% IN NEBU
2.5000 mg | INHALATION_SOLUTION | RESPIRATORY_TRACT | 2 refills | Status: DC | PRN
Start: 2022-04-16 — End: 2022-11-22

## 2022-04-16 MED ORDER — PANTOPRAZOLE SODIUM 40 MG PO TBEC: 40.0000 mg | DELAYED_RELEASE_TABLET | Freq: Every day | ORAL | 3 refills | Status: DC

## 2022-04-16 MED ORDER — FLUTICASONE FUROATE-VILANTEROL 200-25 MCG/ACT IN AEPB: 1.0000 | INHALATION_SPRAY | Freq: Every day | RESPIRATORY_TRACT | 3 refills | Status: DC

## 2022-04-16 MED ORDER — AMOXICILLIN-POT CLAVULANATE 875-125 MG PO TABS: 1.0000 | ORAL_TABLET | Freq: Two times a day (BID) | ORAL | 0 refills | Status: AC

## 2022-04-16 NOTE — Telephone Encounter (Signed)
Called and spoke to Gregory Mitchell patients daughter. She states that Dr. Melvyn Novas took patient off of entresto and started valsartan. Patient was recently discharged from [facility] on 9/2 and they told patient and daughter at D/C to start taking the entresto again and stop the valsartan. Patients daughter did not do this and patient was recently admitted again this time at [facility] from 9/6-9/8 and they have told Gregory Mitchell the same thing: that she should stop the valsartan and start entresto again. She wants to know from Dr. Melvyn Novas what she should do, if patient should take either medication.   Please advise Dr. Melvyn Novas.

## 2022-04-16 NOTE — Discharge Instructions (Signed)
1)Please talk to your cardiologist about restarting SACUBITRIL-VALSARTAN 24-26 MG PO TABS/Entresto.  Which is a medication for heart failure.. And possibly restarting apixaban/Eliquis which is a blood thinner  2)Avoid ibuprofen/Advil/Aleve/Motrin/Goody Powders/Naproxen/BC powders/Meloxicam/Diclofenac/Indomethacin and other Nonsteroidal anti-inflammatory medications as these will make you more likely to bleed and can cause stomach ulcers, can also cause Kidney problems.   3)You need oxygen at home at  3 to 4 L via nasal cannula continuously while awake and while asleep--- smoking or having open fires around oxygen can cause fire, significant injury and death  4)Repeat CBC and BMP in about a week or so  5)Very low-salt diet advised--- very low-sodium diet advised  6)Weigh yourself daily, call if you gain more than 3 pounds in 1 day or more than 5 pounds in 1 week as your diuretic medications may need to be adjusted

## 2022-04-16 NOTE — Telephone Encounter (Signed)
Called and spoke to Gregory Mitchell and went over recs from Dr. Melvyn Novas. She voiced understanding. Patient has an appt 9/14. She is aware to bring all meds to appt. Nothing further needed

## 2022-04-16 NOTE — Progress Notes (Signed)
Physical Therapy Treatment Patient Details Name: Gregory Mitchell MRN: [id number] DOB: 05-Sep-1940 Today's Date: 04/16/2022   History of Present Illness Gregory Mitchell is a 81 y.o. male with medical history significant for diabetes mellitus, pulmonary embolism, hypertension, COPD with chronic respiratory failure on 3 L, CKD 3, systolic and diastolic CHF.  Patient presented to the ED with complaints of chest pain, difficulty breathing generalized weakness.     Patient was recently admitted at [facility] 8/31 - 9/1, for pneumonia and CHF.  Per Care Everywhere, he was treated with IV Rocephin and doxycycline and switched to meropenem, Doxy as it was a concern for aspiration.  Given 80 mg IV Lasix and resumed on home Demadex. Patient requested discharge from the hospital after 1 day hospitalization because he felt better, but daughter at bedside says they left because the medication he was being given did not agree with him.  He was discharged on antibiotics, moxifloxacin and he has taken 6 of the 10 doses prescribed.     Daughter reports that in the hospital they noticed a change in his speech, he was having a some delay in saying his words.  This has persisted to today.  No facial asymmetry.  He generally weak and has not been able to walk, generalized weakness to upper and lower extremities.  No focal deficits.  Ambulates with a cane at baseline.  Daughter also reports intermittent twitching/jerking of his leg that started over the past week since leaving the hospital.     From a respiratory standpoint, he is still having difficulty breathing, this is unchanged from when he was in the hospital, his cough is better.  Reports onset of chest pain midsternal, worse with breathing, and intermittent, no known relieving factors.  Described as heaviness.  No history of heart attack, but he has cardiomyopathy.  Swelling that was present during the hospitalization has resolved.     No fevers.  No vomiting no loose  stools.    PT Comments    Patient does not require assist for bed mobility. He demonstrates good sitting balance while completing seated exercises at EOB. Patient does not require assist or AD to transfer to standing but does have posterior loss of balance immediately upon standing but is able to recover with use of UE support on bed rail. Patient demonstrating improving activity tolerance and ambulates increased distance with SPC without loss of balance. Patient returned to room at end of session. Patient will benefit from continued skilled physical therapy in hospital and recommended venue below to increase strength, balance, endurance for safe ADLs and gait.   Recommendations for follow up therapy are one component of a multi-disciplinary discharge planning process, led by the attending physician.  Recommendations may be updated based on patient status, additional functional criteria and insurance authorization.  Follow Up Recommendations  No PT follow up     Assistance Recommended at Discharge Set up Supervision/Assistance  Patient can return home with the following A little help with walking and/or transfers;A little help with bathing/dressing/bathroom;Help with stairs or ramp for entrance;Assistance with cooking/housework   Equipment Recommendations  None recommended by PT    Recommendations for Other Services       Precautions / Restrictions Precautions Precautions: Fall Restrictions Weight Bearing Restrictions: No     Mobility  Bed Mobility Overal bed mobility: Modified Independent                  Transfers Overall transfer level: Needs assistance Equipment used:  None   Sit to Stand: Modified independent (Device/Increase time), Min guard           General transfer comment: posterior loss of balance upon standing but patind able to maintain with UE support on bedrail    Ambulation/Gait Ambulation/Gait assistance: Supervision, Modified independent  (Device/Increase time) Gait Distance (Feet): 200 Feet Assistive device: Straight cane Gait Pattern/deviations: Decreased step length - right, Decreased step length - left, Decreased stride length Gait velocity: decreased     General Gait Details: slightly labored cadence without loss of balance using SPC ambulating in room/hallway   Stairs             Wheelchair Mobility    Modified Rankin (Stroke Patients Only)       Balance Overall balance assessment: Needs assistance Sitting-balance support: Feet supported, No upper extremity supported Sitting balance-Leahy Scale: Good Sitting balance - Comments: seated at EOB   Standing balance support: During functional activity, No upper extremity supported Standing balance-Leahy Scale: Fair Standing balance comment: fair/good using SPC                            Cognition Arousal/Alertness: Awake/alert Behavior During Therapy: WFL for tasks assessed/performed Overall Cognitive Status: Within Functional Limits for tasks assessed                                          Exercises General Exercises - Lower Extremity Ankle Circles/Pumps: AROM, Both, 20 reps, Seated Long Arc Quad: AROM, Both, 20 reps, Seated Hip Flexion/Marching: AROM, Both, 20 reps, Seated    General Comments        Pertinent Vitals/Pain Pain Assessment Pain Assessment: No/denies pain    Home Living                          Prior Function            PT Goals (current goals can now be found in the care plan section) Acute Rehab PT Goals Patient Stated Goal: return home with family to assist PT Goal Formulation: With patient Time For Goal Achievement: 04/17/22 Potential to Achieve Goals: Good Progress towards PT goals: Progressing toward goals    Frequency    Min 2X/week      PT Plan Current plan remains appropriate    Co-evaluation              AM-PAC PT "6 Clicks" Mobility   Outcome  Measure  Help needed turning from your back to your side while in a flat bed without using bedrails?: None Help needed moving from lying on your back to sitting on the side of a flat bed without using bedrails?: None Help needed moving to and from a bed to a chair (including a wheelchair)?: A Little Help needed standing up from a chair using your arms (e.g., wheelchair or bedside chair)?: A Little Help needed to walk in hospital room?: A Little Help needed climbing 3-5 steps with a railing? : A Little 6 Click Score: 20    End of Session   Activity Tolerance: Patient tolerated treatment well;Patient limited by fatigue Patient left: with call bell/phone within reach;in bed Nurse Communication: Mobility status PT Visit Diagnosis: Unsteadiness on feet (R26.81);Other abnormalities of gait and mobility (R26.89);Muscle weakness (generalized) (M62.81)     Time: 2130-8657 PT Time  Calculation (min) (ACUTE ONLY): 8 min  Charges:  $Therapeutic Activity: 8-22 mins                     12:04 PM, 04/16/22 Mearl Latin PT, DPT Physical Therapist at [facility]

## 2022-04-16 NOTE — Telephone Encounter (Signed)
There's no evidence that his admits had anything to do with chf so I would leave off the entresto and continue to valartan but it will need to be adjusted to a systolic BP from 990 -940 range  - if unable to measure just take it once a day until see if office - ok to add on Monday but must bring all meds with him or won't be able to help

## 2022-04-16 NOTE — Discharge Summary (Signed)
Gregory Mitchell, is a 81 y.o. male  DOB [birth year]  MRN [id number].  Admission date:  04/14/2022  Admitting Physician  Bethena Roys, MD  Discharge Date:  04/16/2022   Primary MD  Beola Cord, FNP  Recommendations for primary care physician for things to follow:   1)Please talk to your cardiologist about restarting SACUBITRIL-VALSARTAN 24-26 MG PO TABS/Entresto.  Which is a medication for heart failure.. And possibly restarting apixaban/Eliquis which is a blood thinner  2)Avoid ibuprofen/Advil/Aleve/Motrin/Goody Powders/Naproxen/BC powders/Meloxicam/Diclofenac/Indomethacin and other Nonsteroidal anti-inflammatory medications as these will make you more likely to bleed and can cause stomach ulcers, can also cause Kidney problems.   3)You need oxygen at home at  3 to 4 L via nasal cannula continuously while awake and while asleep--- smoking or having open fires around oxygen can cause fire, significant injury and death  4)Repeat CBC and BMP in about a week or so  5)Very low-salt diet advised--- very low-sodium diet advised  6)Weigh yourself daily, call if you gain more than 3 pounds in 1 day or more than 5 pounds in 1 week as your diuretic medications may need to be adjusted   Admission Diagnosis  COPD exacerbation ([place]) [J44.1] Acute respiratory failure with hypoxia ([place]) [J96.01] PNA (pneumonia) [J18.9]   Discharge Diagnosis  COPD exacerbation ([place]) [J44.1] Acute respiratory failure with hypoxia ([place]) [J96.01] PNA (pneumonia) [J18.9]    Principal Problem:   PNA (pneumonia) Active Problems:   Type 2 diabetes mellitus ([place])   Essential hypertension   COPD GOLD 3 / obstructive bronchiectasis by HRCT in 2015    Chronic kidney disease, stage 3a ([place])   Chronic combined systolic and diastolic CHF (congestive heart failure) ([place])   Pulmonary embolism (HCC)   Chronic respiratory failure with  hypoxia (HCC)   Speech abnormality      Past Medical History:  Diagnosis Date   Asthma    Chronic renal insufficiency    COPD (chronic obstructive pulmonary disease) ([place])    Heart failure ([place])    a. EF 25-30% in 08/2019 with cath showing normal cors b. EF 20-25% in 09/2020 c. 30-35% in 02/2021 d. EF 20-25% in 10/2021   Hypertension    Pulmonary emboli ([place]) 03/2019   Renal insufficiency    Type 2 diabetes mellitus ([place])     Past Surgical History:  Procedure Laterality Date   APPENDECTOMY     BALLOON DILATION N/A 03/18/2020   Procedure: BALLOON DILATION;  Surgeon: Eloise Harman, DO;  [facility];  Service: Endoscopy;  Laterality: N/A;   CHOLECYSTECTOMY     COLONOSCOPY  10/2006   Dr.Anwar:normal   ESOPHAGEAL BRUSHING  08/24/2021   Procedure: ESOPHAGEAL BRUSHING;  Surgeon: Eloise Harman, DO;  [facility];  Service: Endoscopy;;   ESOPHAGOGASTRODUODENOSCOPY (EGD) WITH PROPOFOL N/A 03/18/2020   Procedure: ESOPHAGOGASTRODUODENOSCOPY (EGD) WITH PROPOFOL;  Surgeon: Eloise Harman, DO;  [facility];  Service: Endoscopy;  Laterality: N/A;  2:30pm   ESOPHAGOGASTRODUODENOSCOPY (EGD) WITH PROPOFOL N/A 08/24/2021  Procedure: ESOPHAGOGASTRODUODENOSCOPY (EGD) WITH PROPOFOL;  Surgeon: Eloise Harman, DO;  [facility];  Service: Endoscopy;  Laterality: N/A;  with dilation   FLEXIBLE SIGMOIDOSCOPY N/A 08/24/2021   Procedure: FLEXIBLE SIGMOIDOSCOPY;  Surgeon: Eloise Harman, DO;  [facility];  Service: Endoscopy;  Laterality: N/A;   NASAL SINUS SURGERY     NEPHRECTOMY     RIGHT (Question CA.  No further therapy needed.)   RIGHT/LEFT HEART CATH AND CORONARY ANGIOGRAPHY N/A 09/03/2019   Procedure: RIGHT/LEFT HEART CATH AND CORONARY ANGIOGRAPHY;  Surgeon: Lorretta Harp, MD;  Location: [facility];  Service: Cardiovascular;  Laterality: N/A;       HPI  from the history and physical done on the day of admission:    Chief Complaint: Difficulty breathing.   HPI: Gregory Mitchell is a 81 y.o. male with medical history significant for diabetes mellitus, pulmonary embolism, hypertension, COPD with chronic respiratory failure on 3 L, CKD 3, systolic and diastolic CHF. Patient presented to the ED with complaints of chest pain, difficulty breathing generalized weakness.   Patient was recently admitted at [facility] 8/31 - 9/1, for pneumonia and CHF.  Per Care Everywhere, he was treated with IV Rocephin and doxycycline and switched to meropenem, Doxy as it was a concern for aspiration.  Given 80 mg IV Lasix and resumed on home Demadex. Patient requested discharge from the hospital after 1 day hospitalization because he felt better, but daughter at bedside says they left because the medication he was being given did not agree with him.  He was discharged on antibiotics, moxifloxacin and he has taken 6 of the 10 doses prescribed.   Daughter reports that in the hospital they noticed a change in his speech, he was having a some delay in saying his words.  This has persisted to today.  No facial asymmetry.  He generally weak and has not been able to walk, generalized weakness to upper and lower extremities.  No focal deficits.  Ambulates with a cane at baseline.  Daughter also reports intermittent twitching/jerking of his leg that started over the past week since leaving the hospital.   From a respiratory standpoint, he is still having difficulty breathing, this is unchanged from when he was in the hospital, his cough is better. Reports onset of chest pain midsternal, worse with breathing, and intermittent, no known relieving factors.  Described as heaviness.  No history of heart attack, but he has cardiomyopathy.  Swelling that was present during the hospitalization has resolved.    No fevers.  No vomiting no loose stools.   ED Course: Tmax 98.5.  Heart rate 91-108.  Respiratory rate 18-29.  Blood pressure systolic  initially 25/42, improved to 138/64 after fluids.  O2 sats greater than 91% on room air. Chest x-ray showing left greater than right lungs base opacities consistent with bronchopneumonia in combination with chronic bronchiectasis. Troponin 20 > 17. Lactic acid 2.2 > 1.8. BNP 230 improved compared to prior.   2 L bolus given, maintenance fluids started.   Antibiotics Vanco and Zosyn started.  125 mg Solu-Medrol given.  Hospitalist admit for pneumonia.     Review of Systems: As per HPI all other systems reviewed and negative.     Hospital Course:    Assessment and Plan: 1) acute COPD exacerbation due to PNA (pneumonia) in the setting of underlying bronchiectasis -At baseline patient has COPD GOLD 3 / obstructive bronchiectasis by HRCT in 2015  --patient was  treated with 6 days of Avelox recently -She was admitted to the hospital due to worsening respiratory symptoms including cough dyspnea and increased oxygen requirement persist -D-dimer elevated however VQ scan negative for PE -Patient was treated with bronchodilators, mucolytics IV Solu-Medrol mucolytics as ordered and antibiotics -Okay to discharge home on Augmentin given concerns for pneumonia in the setting of underlying bronchiectasis having failed Avelox recently -Clinically much improved -Outpatient follow-up with pulmonologist Dr. Melvyn Novas advised   Acute on chronic hypoxic respiratory failure-- Oxygen requirement currently higher than baseline -At baseline patient uses up to 3 L of oxygen at home mostly at night -Much improved with above treatment as above #1 -At this time patient appears to be back to baseline oxygen requirement at this time   Speech abnormality Daughter reports hesitancy/delay in patient's voice in his words.   He has had intermittent twitching since discharge from hospital 04/09/22, may be related to quinolone.  Generalized weakness without focal deficits. -CT head without acute findings -Speech overall  improved -PT eval appreciated no further PT needs -Speech and swallowing appears to be back to baseline   Chronic combined systolic and diastolic CHF (congestive heart failure) (HCC) Stable and compensated.  Last echo 10/2020 EF of 20 to 25% with grade 2 DD. -Okay to restart torsemide -Restart Farxiga -Patient is advised to discuss with cardiologist if he should restart Entresto   Chronic kidney disease, stage 3a  Renal function appears close to baseline  renally adjust medications, avoid nephrotoxic agents / dehydration  / hypotension   HTN -Restart torsemide, metoprolol -Patient should ask his cardiologist if he should restart Entresto   Type 2 diabetes mellitus ([place]) -=-Last A1c 6.2 reflecting excellent diabetic control PTA Restart Farxiga   Generalized weakness/deconditioning----PT eval appreciated, no further PT needs identified   Disposition: The patient is from: Home              Anticipated d/c is to: Home    Code Status :  -  Code Status: Full Code    Discharge Condition: stable  Follow UP   Follow-up Information     Arnoldo Lenis, MD Follow up in 1 week(s).   Specialty: Cardiology Why: ?? if he needs Delene Loll and [employer] information: [place] [address] [phone]                Diet and Activity recommendation:  As advised  Discharge Instructions    Discharge Instructions     Call MD for:  difficulty breathing, headache or visual disturbances   Complete by: As directed    Call MD for:  persistant dizziness or light-headedness   Complete by: As directed    Call MD for:  persistant nausea and vomiting   Complete by: As directed    Call MD for:  temperature >100.4   Complete by: As directed    Diet - low sodium heart healthy   Complete by: As directed    Discharge instructions   Complete by: As directed    1)Please talk to your cardiologist about restarting SACUBITRIL-VALSARTAN 24-26 MG PO  TABS/Entresto.  Which is a medication for heart failure.. And possibly restarting apixaban/Eliquis which is a blood thinner  2)Avoid ibuprofen/Advil/Aleve/Motrin/Goody Powders/Naproxen/BC powders/Meloxicam/Diclofenac/Indomethacin and other Nonsteroidal anti-inflammatory medications as these will make you more likely to bleed and can cause stomach ulcers, can also cause Kidney problems.   3)You need oxygen at home at  3 to 4 L via nasal cannula continuously while  awake and while asleep--- smoking or having open fires around oxygen can cause fire, significant injury and death  4)Repeat CBC and BMP in about a week or so  5)Very low-salt diet advised--- very low-sodium diet advised  6)Weigh yourself daily, call if you gain more than 3 pounds in 1 day or more than 5 pounds in 1 week as your diuretic medications may need to be adjusted   Increase activity slowly   Complete by: As directed          Discharge Medications     Allergies as of 04/16/2022   No Known Allergies      Medication List     STOP taking these medications    Eliquis 5 MG Tabs tablet Generic drug: apixaban   valsartan 80 MG tablet Commonly known as: Diovan       TAKE these medications    albuterol (2.5 MG/3ML) 0.083% nebulizer solution Commonly known as: PROVENTIL Take 3 mLs (2.5 mg total) by nebulization every 4 (four) hours as needed for wheezing or shortness of breath. What changed:  how much to take how to take this when to take this reasons to take this additional instructions   amoxicillin-clavulanate 875-125 MG tablet Commonly known as: AUGMENTIN Take 1 tablet by mouth 2 (two) times daily for 5 days.   atorvastatin 80 MG tablet Commonly known as: LIPITOR Take 80 mg by mouth every evening.   budesonide 0.25 MG/2ML nebulizer solution Commonly known as: Pulmicort Take 2 mLs (0.25 mg total) by nebulization 2 (two) times daily.   dapagliflozin propanediol 10 MG Tabs tablet Commonly known  as: Farxiga Take 1 tablet (10 mg total) by mouth daily before breakfast.   fluticasone furoate-vilanterol 200-25 MCG/ACT Aepb Commonly known as: Breo Ellipta Inhale 1 puff into the lungs daily.   guaiFENesin 600 MG 12 hr tablet Commonly known as: Mucinex Take 1 tablet (600 mg total) by mouth 2 (two) times daily.   HM Senna-S 8.6-50 MG tablet Generic drug: senna-docusate Take 2 tablets by mouth daily.   HYDROcodone-acetaminophen 7.5-325 MG tablet Commonly known as: NORCO Take 1 tablet by mouth 2 (two) times daily as needed for moderate pain.   lactulose 10 GM/15ML solution Commonly known as: CHRONULAC Take 30 mLs (20 g total) by mouth 3 (three) times daily as needed for mild constipation.   metoprolol succinate 25 MG 24 hr tablet Commonly known as: Toprol XL Take 1 tablet (25 mg total) by mouth daily.   mirtazapine 15 MG tablet Commonly known as: REMERON Take 15 mg by mouth daily.   OXYGEN Inhale 4 L into the lungs daily as needed (for shortness of breath).   pantoprazole 40 MG tablet Commonly known as: PROTONIX Take 1 tablet (40 mg total) by mouth daily. What changed:  how much to take how to take this when to take this additional instructions   predniSONE 20 MG tablet Commonly known as: DELTASONE Take 2 tablets (40 mg total) by mouth daily with breakfast for 5 days. What changed:  medication strength how much to take when to take this   sacubitril-valsartan 24-26 MG Commonly known as: ENTRESTO Take by mouth.   tamsulosin 0.4 MG Caps capsule Commonly known as: FLOMAX Take 0.8 mg by mouth 2 (two) times daily.   torsemide 20 MG tablet Commonly known as: DEMADEX Take 1 tablet (20 mg total) by mouth as needed (swelling).        Major procedures and Radiology Reports - PLEASE review detailed and final reports  for all details, in brief -   NM Pulmonary Perfusion  Result Date: 04/15/2022 CLINICAL DATA:  Cough, shortness of breath, chest pain, dyspnea,  elevated D-dimer, symptoms for 2 weeks EXAM: NUCLEAR MEDICINE PERFUSION LUNG SCAN TECHNIQUE: Perfusion images were obtained in multiple projections after intravenous injection of radiopharmaceutical. Ventilation scans intentionally deferred if perfusion scan and chest x-ray adequate for interpretation during COVID 19 epidemic. RADIOPHARMACEUTICALS:  4.4 mCi Tc-56mMAA IV COMPARISON:  Chest radiograph 04/14/2022 FINDINGS: No wedge-shaped perfusion defects are identified to suggest pulmonary embolism. Mild nonsegmental irregularity of perfusion in the periphery of the lungs bilaterally corresponding to COPD changes and bibasilar atelectasis on prior chest radiograph. IMPRESSION: Pulmonary embolism absent. Electronically Signed   By: MLavonia DanaM.D.   On: 04/15/2022 08:53   CT HEAD WO CONTRAST (5MM)  Result Date: 04/14/2022 CLINICAL DATA:  Change in speech 6 days ago. EXAM: CT HEAD WITHOUT CONTRAST TECHNIQUE: Contiguous axial images were obtained from the base of the skull through the vertex without intravenous contrast. RADIATION DOSE REDUCTION: This exam was performed according to the departmental dose-optimization program which includes automated exposure control, adjustment of the mA and/or kV according to patient size and/or use of iterative reconstruction technique. COMPARISON:  February 27, 2022 FINDINGS: Brain: There is mild cerebral atrophy with widening of the extra-axial spaces and ventricular dilatation. There are areas of decreased attenuation within the white matter tracts of the supratentorial brain, consistent with microvascular disease changes. Vascular: No hyperdense vessel or unexpected calcification. Skull: Normal. Negative for fracture or focal lesion. Sinuses/Orbits: There is marked severity bilateral maxillary sinus and bilateral ethmoid sinus mucosal thickening. Other: None. IMPRESSION: Generalized cerebral atrophy and chronic white matter small vessel ischemic changes without evidence of an  acute intracranial abnormality. Electronically Signed   By: TVirgina NorfolkM.D.   On: 04/14/2022 22:39   DG Chest Port 1 View  Result Date: 04/14/2022 CLINICAL DATA:  Cough.  Shortness of breath.  Chest pain. EXAM: PORTABLE CHEST 1 VIEW COMPARISON:  04/08/2022 and 03/31/2022.  CT, 04/08/2022. FINDINGS: Cardiac silhouette is borderline enlarged. No mediastinal or hilar masses. Bronchial wall thickening with streaky and patchy opacities at the lung bases, most evident on the left. Findings are without change from the prior chest radiograph and CT. Remainder of the lungs is clear. No convincing pleural effusion.  No pneumothorax. Skeletal structures are grossly intact. IMPRESSION: 1. Left greater than right lung base opacities, better appreciated on the recent prior CT, consistent with bronchopneumonia in combination with chronic bronchiectasis. 2. No significant change from the most recent prior studies. Electronically Signed   By: DLajean ManesM.D.   On: 04/14/2022 18:55   DG Chest Port 1 View  Result Date: 03/31/2022 CLINICAL DATA:  Shortness of breath EXAM: PORTABLE CHEST 1 VIEW COMPARISON:  02/27/2022 FINDINGS: Chronic left basilar atelectasis/scarring. No focal airspace consolidation. No pleural effusion or pneumothorax. Normal cardiomediastinal contours. IMPRESSION: Chronic left basilar atelectasis/scarring. Electronically Signed   By: KUlyses JarredM.D.   On: 03/31/2022 02:59    Micro Results   Recent Results (from the past 240 hour(s))  Blood Culture (routine x 2)     Status: None (Preliminary result)   Collection Time: 04/14/22  5:54 PM   Specimen: BLOOD RIGHT ARM  Result Value Ref Range Status   Specimen Description BLOOD RIGHT ARM  Final   Special Requests   Final    BOTTLES DRAWN AEROBIC AND ANAEROBIC Blood Culture adequate volume   Culture   Final  NO GROWTH < 24 HOURS Performed at [facility], [address], [place], [place] [address]    Report Status PENDING  Incomplete   Resp Panel by RT-PCR (Flu A&B, Covid) Anterior Nasal Swab     Status: None   Collection Time: 04/14/22  6:04 PM   Specimen: Anterior Nasal Swab  Result Value Ref Range Status   SARS Coronavirus 2 by RT PCR NEGATIVE NEGATIVE Final    Comment: (NOTE) SARS-CoV-2 target nucleic acids are NOT DETECTED.  The SARS-CoV-2 RNA is generally detectable in upper respiratory specimens during the acute phase of infection. The lowest concentration of SARS-CoV-2 viral copies this assay can detect is 138 copies/mL. A negative result does not preclude SARS-Cov-2 infection and should not be used as the sole basis for treatment or other patient management decisions. A negative result may occur with  improper specimen collection/handling, submission of specimen other than nasopharyngeal swab, presence of viral mutation(s) within the areas targeted by this assay, and inadequate number of viral copies(<138 copies/mL). A negative result must be combined with clinical observations, patient history, and epidemiological information. The expected result is Negative.  Fact Sheet for Patients:  EntrepreneurPulse.com.au  Fact Sheet for Healthcare Providers:  IncredibleEmployment.be  This test is no t yet approved or cleared by the [place] FDA and  has been authorized for detection and/or diagnosis of SARS-CoV-2 by FDA under an Emergency Use Authorization (EUA). This EUA will remain  in effect (meaning this test can be used) for the duration of the COVID-19 declaration under Section 564(b)(1) of the Act, 21 U.S.C.section 360bbb-3(b)(1), unless the authorization is terminated  or revoked sooner.       Influenza A by PCR NEGATIVE NEGATIVE Final   Influenza B by PCR NEGATIVE NEGATIVE Final    Comment: (NOTE) The Xpert Xpress SARS-CoV-2/FLU/RSV plus assay is intended as an aid in the diagnosis of influenza from Nasopharyngeal swab specimens and should not be used as a  sole basis for treatment. Nasal washings and aspirates are unacceptable for Xpert Xpress SARS-CoV-2/FLU/RSV testing.  Fact Sheet for Patients: EntrepreneurPulse.com.au  Fact Sheet for Healthcare Providers: IncredibleEmployment.be  This test is not yet approved or cleared by the [place] FDA and has been authorized for detection and/or diagnosis of SARS-CoV-2 by FDA under an Emergency Use Authorization (EUA). This EUA will remain in effect (meaning this test can be used) for the duration of the COVID-19 declaration under Section 564(b)(1) of the Act, 21 U.S.C. section 360bbb-3(b)(1), unless the authorization is terminated or revoked.  Performed at [facility], [address] [place], [place] [address]   Blood Culture (routine x 2)     Status: None (Preliminary result)   Collection Time: 04/14/22  6:06 PM   Specimen: BLOOD LEFT ARM  Result Value Ref Range Status   Specimen Description BLOOD LEFT ARM  Final   Special Requests   Final    BOTTLES DRAWN AEROBIC AND ANAEROBIC Blood Culture adequate volume   Culture   Final    NO GROWTH < 24 HOURS Performed at [facility], [address], [place], [place] [address]    Report Status PENDING  Incomplete  Urine Culture     Status: Abnormal   Collection Time: 04/14/22  6:39 PM   Specimen: In/Out Cath Urine  Result Value Ref Range Status   Specimen Description   Final    IN/OUT CATH URINE Performed at [facility], [address], [place], [place] [address]    Special Requests  Final    NONE Performed at [facility], [address], [place], [place] [address]    Culture (A)  Final    <10,000 COLONIES/mL INSIGNIFICANT GROWTH Performed at [place] [address] [place], [place] [address]    Report Status 04/15/2022 FINAL  Final    Today   Subjective    Gregory Mitchell today has no new complaints -Cough and shortness of breath has improved significantly -Oxygen requirement  appears back to baseline          Patient has been seen and examined prior to discharge   Objective   Blood pressure 101/68, pulse 92, temperature 98.2 F (36.8 C), temperature source Oral, resp. rate 16, height '5\' 6"'$  (1.676 m), weight 62.7 kg, SpO2 100 %.   Intake/Output Summary (Last 24 hours) at 04/16/2022 1159 Last data filed at 04/16/2022 0254 Gross per 24 hour  Intake 480 ml  Output 200 ml  Net 280 ml    Exam Gen:- Awake Alert, no acute distress , no conversational dyspnea HEENT:- [place].AT, No sclera icterus Nose-[place] 3L/min Neck-Supple Neck,No JVD,.  Lungs-improved air movement, no wheezing  CV- S1, S2 normal, irregular Abd-  +ve B.Sounds, Abd Soft, No tenderness,    Extremity/Skin:- No  edema,   good pulses Psych-affect is appropriate, oriented x3 Neuro-no new focal deficits, no tremors    Data Review   CBC w Diff:  Lab Results  Component Value Date   WBC 7.2 04/15/2022   HGB 8.6 (L) 04/15/2022   HGB 9.4 (L) 01/28/2022   HCT 28.8 (L) 04/15/2022   HCT 30.9 (L) 01/28/2022   PLT 252 04/15/2022   PLT 290 01/28/2022   LYMPHOPCT 20 03/31/2022   MONOPCT 11 03/31/2022   EOSPCT 2 03/31/2022   BASOPCT 0 03/31/2022    CMP:  Lab Results  Component Value Date   NA 137 04/15/2022   NA 134 04/05/2022   K 4.3 04/15/2022   CL 107 04/15/2022   CO2 25 04/15/2022   BUN 21 04/15/2022   BUN 34 (H) 04/05/2022   CREATININE 1.61 (H) 04/15/2022   PROT 6.5 04/14/2022   ALBUMIN 2.9 (L) 04/14/2022   BILITOT 0.2 (L) 04/14/2022   ALKPHOS 55 04/14/2022   AST 17 04/14/2022   ALT 11 04/14/2022  .  Total Discharge time is about 33 minutes  Roxan Hockey M.D on 04/16/2022 at 11:59 AM  Go to [email or web address] -  for contact info  [facility] - Office  [phone]

## 2022-04-16 NOTE — Progress Notes (Signed)
Pt stated that the DR said he could go home anddidnt want any more of "our meds or food" calling his ride for DC.the patient is able to make needs known. Nursing staff will review AVS, and transport pt to main entrance when ride  arrives.

## 2022-04-16 NOTE — Telephone Encounter (Signed)
Patient's daughter, Apolonio Schneiders, called and states that he was released from [facility] yesterday and they changed his medications. Apolonio Schneiders would like a call back to go over what medications he should be taken.  Please call [phone].

## 2022-04-16 NOTE — Care Management Important Message (Signed)
Important Message  Patient Details  Name: Gregory Mitchell MRN: [id number] Date of Birth: 04-20-1941   Medicare Important Message Given:  Yes     Tommy Medal 04/16/2022, 1:44 PM

## 2022-04-19 LAB — CULTURE, BLOOD (ROUTINE X 2)
Culture: NO GROWTH
Culture: NO GROWTH
Special Requests: ADEQUATE
Special Requests: ADEQUATE

## 2022-04-22 ENCOUNTER — Encounter: Payer: Self-pay | Admitting: Internal Medicine

## 2022-04-22 ENCOUNTER — Ambulatory Visit: Payer: Medicare HMO | Admitting: Internal Medicine

## 2022-04-22 DIAGNOSIS — J449 Chronic obstructive pulmonary disease, unspecified: Secondary | ICD-10-CM | POA: Diagnosis not present

## 2022-04-22 DIAGNOSIS — R1319 Other dysphagia: Secondary | ICD-10-CM | POA: Diagnosis not present

## 2022-04-22 DIAGNOSIS — J9611 Chronic respiratory failure with hypoxia: Secondary | ICD-10-CM

## 2022-04-22 NOTE — Patient Instructions (Signed)
My office will be contacting you by phone for referral to modified barium swallow  and diagnostic esophagram - if you don't hear back from my office within one week please call us back or notify us thru MyChart and we'll address it right away.    Continue budesonide with the other nebulizer solution twice daily but call me with the name  Work on inhaler technique:  relax and gently blow all the way out then take a nice smooth full deep breath back in, triggering the inhaler at same time you start breathing in.  Hold breath in for at least  5 seconds if you can.  Rinse and gargle with water when done.  If mouth or throat bother you at all,  try brushing teeth/gums/tongue with arm and hammer toothpaste/ make a slurry and gargle and spit out.   Make sure you check your oxygen saturation  AT  your highest level of activity (not after you stop)   to be sure it stays over 90% and adjust  02 flow upward to maintain this level if needed but remember to turn it back to previous settings when you stop (to conserve your supply).      Please remember to go to the lab department   for your tests - we will call you with the results when they are available.       Please schedule a follow up office visit in 2 weeks, sooner if needed

## 2022-04-22 NOTE — Progress Notes (Unsigned)
Gregory Mitchell, male    DOB: Jul 24, 1941    MRN: [id number]   Brief patient profile:  9   yobm  quit smoking 1978 no apparent  resp sequelae referred to pulmonary clinic in [facility]  01/28/2022 for copd eval  by Bernerd Pho PA following in cards clinic for severe chf     Prior pulmonary eval 2015 c/o doe x 2 y with GOLD 3 criteria at that point with hrct 11/16/13 with bronchiectasis ? MAI   Rx 02 around 2021  ? After covid ?   History of Present Illness  01/28/2022  Pulmonary/ 1st office eval/ Shizuo Biskup / [name] / trelegy maint Chief Complaint  Patient presents with   Consult    Ref by cardiology for copd  does have O2 at home he uses prn   Dyspnea:  maybe 100 ft at grocery handicap parking  Cough: x  years worse x one year p covid 19 in 05/2020 along with 02 need assoc with hoarsness / min  white mucus production worse in ams Sleep:on side flat bed 2 pillows  SABA use: not really helping  02 :  3lpm hs and prn but not titrating  Rec Stop trelegy and the ventolin / albuterol  Pantoprazole 40 mg Take 30- 60 min before your first and last meals of the day  Take duoneb  four times daily  Add budesonide 0.25 mg twice daily to the nebulizer  Prednisone 10 mg take  4 each am x 2 days,   2 each am x 2 days,  1 each am x 2 days and stop     Allergy screen 01/28/22  >  Eos 0.2 /  IgE  / ESR 41.  Wear 02 so you keep your 02 saturation s above 90% at all time  For cough > mucinex 1200 mg every 12 hours and Use the flutter valve as much possible Please schedule a follow up office visit in 4 weeks, sooner if needed  with all medications /inhalers/ solutions     02/24/2022  f/u ov/[place] office/Florinda Taflinger re: copd/uacs  maint on nebs but confused on which ones when / also still on entresto   Chief Complaint  Patient presents with   Follow-up    Feels breathing is getting worse.   Dyspnea:  food lion leaning on cart/ s 02  Cough: yellowish better p levaquin then worse again   Sleeping: on bed is level/flat with 2 pillows  SABA use: not much hfa 02: 4lpm 24/7  Covid status: vax x 2  Rec Protonix 40 mg Take 30- 60 min before your first and last meals of the day  - new prescription today  Prednisone Take 4 for three days 3 for three days 2 for three days 1 for three days and stop  Levaquin 500 mg daily  x 7 days  Plan A = Automatic = Always=    Aformoterol 15 mcg 1st thing in am and 12 hours and budsonide  0.5  Plan B = Backup (to supplement plan A, not to replace it) Only use your albuterol nebulizer  as a rescue medication Stop fish oil  GERD diet   I will call Dr Harl Bowie about the [facility]   Please schedule a follow up office visit in  2 weeks  - if getting worse please go to [place] date: 02/26/2022 Discharge date: 03/02/2022  Discharge to: Home with home health to follow-up with pulmonology and cardiology as well as  PCP Discharge Diagnoses:  Pneumonia of left lower lobe due to infectious organism COPD (chronic obstructive pulmonary disease) (CMS-HCC) Heart failure with reduced ejection fraction (CMS-HCC) History of pulmonary embolus (PE) Diabetes mellitus type 2, noninsulin dependent (CMS-HCC) Dysphagia Resolved Problems: Shortness of breath TIA (transient ischemic attack) Atypical chest pain   Procedures: CT head 7/22 CT abdomen and pelvis, chest 7/22 Chest x-ray 7/22 MRI brain 7/24 V/Q scan 7/24 Venous duplex bilateral lower extremities 7/24  Consults:  Physical Therapy Evaluation and Treatment and Occupational Therapy Evaluation and Treatment, speech therapy Hospital Course:  Gregory Mitchell is a 81 y.o. male that presented to [facility] and was admitted for Pneumonia of left lower lobe due to infectious organism on 02/26/2022 11:22 PM . Patient presented to the ED on 7/22 after being unable to talk for 3 to 4 minutes the evening prior. Could not walk or lift his arms. Mostly normal function was restored after  about 5 minutes. He was also having shortness of breath and wheezing. Uses home oxygen at 3 to 4 L. Was admitted to the hospital for left lower lobe pneumonia, TIA stroke work-up, and heart failure.  The following problems were addressed while the patient was admitted to [facility];  1. Dyspnea/chronic hypoxemic respiratory failure/possible left lower lobe pneumonia, bacterial. Patient was admitted for symptoms of worsening shortness of breath.. Chest x-ray with mild, stable left basilar atelectasis versus infiltrate. Patient was afebrile, does not have leukocytosis. He was already on Levaquin and that was continued, dose adjusted. Ct chest wo contrast with chronic postinflammatory changes in the lungs with bilateral apical scarring, bronchiectasis and chronic consolidation in the lung bases, no significant change from previous study. Aggressive pulmonary toiletry was encouraged throughout admission. He has an appointment to follow-up with his pulmonologist Dr. Melvyn Novas in the next couple of weeks. Encouraged patient's daughter Gregory Mitchell to make sure he keeps this appointment. 2. COPD with mild exacerbation/underlying bronchiectasis. Patient was recently seen by pulmonology, has history of COPD and bronchiectasis. He was given DuoNebs, Levaquin, IV Solu-Medrol throughout his hospitalization.  3. Abdominal pain. CT abdomen and pelvis without contrast with no acute changes, showed surgical absence of right kidney, left inguinal hernia with no obstruction. Abdominal pain resolved before discharge. Used Colace and MiraLAX to encourage bowel regularity. 4. Chronic combined systolic and diastolic CHF. Patient has history of nonischemic cardiomyopathy. EF is around 20-25%. Continued his home medications of Delene Loll, Farxiga, Lopressor, torsemide. 5. Possible TIA, ruled out stroke. Patient presented with symptoms of decreased sensations and weakness in his both arms and legs. There was some difficulty with  speech as well. CT scan of the head negative. Symptoms were resolved before discharge. MRI of the brain was negative. He was provided with home health PT at discharge. 6. Type 2 diabetes. Continued with diabetic diet. Insulin sliding scale. A1c 6.2. 7. History of PE. Patient apparently was recently taken off of Eliquis by his doctors. D-dimer slightly elevated on admission, VQ scan and lower extremity Dopplers negative for clot. 8. Mild hyperkalemia. Resolved, received lokelma. 4.3-day at discharge. 9. Anemia of chronic disease. Hemoglobin appears to be stable close to baseline throughout admission. 9.1-day of discharge. 10. CKD stage IIIa. Renal function is close to baseline. Continue to monitor closely outpatient. 1.63 creatinine day of discharge. 11. Dysphagia; patient was seen by speech therapy today. She would like to conduct modified barium swallow but agrees to do it outpatient. Central scheduling will set this up.  Patient's testing was negative for  stroke or thrombus. He is very anxious to go home today and tells me that he will leave whether he is discharged or not. He is very ill-appearing with persistent cough but this may be his baseline. Spoke with patient's daughter Gregory Mitchell by phone extensively and she states he has good support and care at home, lives with his son and daughter-in-law. Gregory Mitchell also checks on him daily. Encouraged close follow-up with pulmonology and PCP. Ordered modified barium swallow for outpatient. Discharge plan discussed with Dr. Leory Plowman who agrees with the plan. Discharge home with home health. He will continue his home oxygen use.  Discharge Medications:   Your Medication List   STOP taking these medications  albuterol 2.5 mg /3 mL (0.083 %) nebulizer solution  apixaban 5 mg Tab  Commonly known as: ELIQUIS  budesonide-formoteroL 160-4.5 mcg/actuation inhaler Commonly known as: SYMBICORT  DALIRESP 250 mcg Tab Generic drug: roflumilast  esomeprazole 40 MG  capsule Commonly known as: NexIUM  famotidine 40 MG tablet Commonly known as: PEPCID  fluconazole 200 MG tablet Commonly known as: DIFLUCAN  hydrALAZINE 10 MG tablet Commonly known as: APRESOLINE  ipratropium 0.02 % nebulizer solution Commonly known as: ATROVENT  methylPREDNISolone 4 mg tablet Commonly known as: MEDROL DOSEPACK  mirtazapine 15 MG tablet Commonly known as: REMERON  omeprazole 40 MG capsule Commonly known as: PriLOSEC  ondansetron 8 MG disintegrating tablet Commonly known as: ZOFRAN-ODT  prednisoLONE sodium phosphate 5 mg base/5 mL (6.7 mg/5 mL) solution Commonly known as: PEDIAPRED    03/08/2022  f/u ov/ office/[place] re: GOLD 3  maint on formoterol  but thoroughly confused with meds/ instructoins from [place]  Patient presents with   Follow-up    Patient feels he is about the same since last visit 2 weeks ago  Dyspnea:  walked in with cane x 50 fts  Cough: yellow mucus looks the same on / off levaquin Sleeping: 4lpm and flat bed/ 2 pillows  SABA use: confused  02: 4lpm hs and prn  Covid status: vax x 3  Rec Protonix (pantoprazole) 40 mg Take 30- 60 min before your first and last meals of the day  - new prescription today  Plan A = Automatic = Always=    Aformoterol 15 mcg 1st thing in am and 12 hours and budsonide 0.25 twice daily  Plan B = Backup (to supplement plan A, not to replace it) Only use your albuterol nebulizer  as a rescue medication  GERD rx Stop entresto  Start valsartan  For cough > mucinex dm 1200 mg every 12 hours and use flutter valve as much as possible   Please schedule a follow up office visit in 4 weeks, sooner if needed  with all medications /inhalers/ solutions/ flutter valve in hand       Discharge Date:  04/16/2022  1)Please talk to your cardiologist about restarting SACUBITRIL-VALSARTAN 24-26 MG PO TABS/Entresto.  Which is a medication for heart failure.. And possibly restarting apixaban/Eliquis  which is a blood thinner 2)Avoid ibuprofen/Advil/Aleve/Motrin/Goody Powders/Naproxen/BC powders/Meloxicam/Diclofenac/Indomethacin and other Nonsteroidal anti-inflammatory medications as these will make you more likely to bleed and can cause stomach ulcers, can also cause Kidney problems.  3)You need oxygen at home at  3 to 4 L via nasal cannula continuously while awake and while asleep--- smoking or having open fires around oxygen can cause fire, significant injury and death 4)Repeat CBC and BMP in about a week or so   5)Very low-salt diet advised--- very low-sodium diet advised  6)Weigh yourself daily, call if you gain more than 3 pounds in 1 day or more than 5 pounds in 1 week as your diuretic medications may need to be adjusted     Admission Diagnosis  COPD exacerbation ([place]) [J44.1] Acute respiratory failure with hypoxia (HCC) [J96.01] PNA (pneumonia) [J18.9]     Discharge Diagnosis  COPD exacerbation ([place]) [J44.1] Acute respiratory failure with hypoxia (HCC) [J96.01] PNA (pneumonia) [J18.9]     Principal Problem:   PNA (pneumonia)   Type 2 diabetes mellitus ([place])   Essential hypertension   COPD GOLD 3 / obstructive bronchiectasis by HRCT in 2015    Chronic kidney disease, stage 3a (HCC)   Chronic combined systolic and diastolic CHF (congestive heart failure) ([place])   Pulmonary embolism (HCC)   Chronic respiratory failure with hypoxia ([place])   Speech abnormality   04/22/2022  f/u ov/[place] office/Calan Doren re: GOLD 3  maint on ***  Chief Complaint  Patient presents with   Follow-up    He has improved some since last OV.   Dyspnea:  food lion walking pushing cart s 02  Cough: some better off entresto  Sleeping: flat bed 2 pillows ok  SABA use: twice a daily inhaler/ also 02: 3lpm      No obvious day to day or daytime variability or assoc excess/ purulent sputum or mucus plugs or hemoptysis or cp or chest tightness, subjective wheeze or overt sinus or hb symptoms.   ***  without nocturnal  or early am exacerbation  of respiratory  c/o's or need for noct saba. Also denies any obvious fluctuation of symptoms with weather or environmental changes or other aggravating or alleviating factors except as outlined above   No unusual exposure hx or h/o childhood pna/ asthma or knowledge of premature birth.  Current Allergies, Complete Past Medical History, Past Surgical History, Family History, and Social History were reviewed in [employer] record.  ROS  The following are not active complaints unless bolded Hoarseness, sore throat, dysphagia, dental problems, itching, sneezing,  nasal congestion or discharge of excess mucus or purulent secretions, ear ache,   fever, chills, sweats, unintended wt loss or wt gain, classically pleuritic or exertional cp,  orthopnea pnd or arm/hand swelling  or leg swelling, presyncope, palpitations, abdominal pain, anorexia, nausea, vomiting, diarrhea  or change in bowel habits or change in bladder habits, change in stools or change in urine, dysuria, hematuria,  rash, arthralgias, visual complaints, headache, numbness, weakness or ataxia or problems with walking or coordination,  change in mood or  memory.        Current Meds  Medication Sig   albuterol (PROVENTIL) (2.5 MG/3ML) 0.083% nebulizer solution Take 3 mLs (2.5 mg total) by nebulization every 4 (four) hours as needed for wheezing or shortness of breath.   atorvastatin (LIPITOR) 80 MG tablet Take 80 mg by mouth every evening.   budesonide (PULMICORT) 0.25 MG/2ML nebulizer solution Take 2 mLs (0.25 mg total) by nebulization 2 (two) times daily.   dapagliflozin propanediol (FARXIGA) 10 MG TABS tablet Take 1 tablet (10 mg total) by mouth daily before breakfast.   fluticasone furoate-vilanterol (BREO ELLIPTA) 200-25 MCG/ACT AEPB Inhale 1 puff into the lungs daily.   guaiFENesin (MUCINEX) 600 MG 12 hr tablet Take 1 tablet (600 mg total) by mouth 2 (two) times daily.    HM SENNA-S 8.6-50 MG tablet Take 2 tablets by mouth daily.   HYDROcodone-acetaminophen (NORCO) 7.5-325 MG tablet Take 1 tablet by mouth 2 (two) times  daily as needed for moderate pain.   lactulose (CHRONULAC) 10 GM/15ML solution Take 30 mLs (20 g total) by mouth 3 (three) times daily as needed for mild constipation.   metoprolol succinate (TOPROL XL) 25 MG 24 hr tablet Take 1 tablet (25 mg total) by mouth daily.   mirtazapine (REMERON) 15 MG tablet Take 15 mg by mouth daily.   OXYGEN Inhale 4 L into the lungs daily as needed (for shortness of breath).   pantoprazole (PROTONIX) 40 MG tablet Take 1 tablet (40 mg total) by mouth daily.   sacubitril-valsartan (ENTRESTO) 24-26 MG Take by mouth.   tamsulosin (FLOMAX) 0.4 MG CAPS capsule Take 0.8 mg by mouth 2 (two) times daily.   torsemide (DEMADEX) 20 MG tablet Take 1 tablet (20 mg total) by mouth as needed (swelling).                    Past Medical History:  Diagnosis Date   Asthma    Chronic renal insufficiency    COPD (chronic obstructive pulmonary disease) (HCC)    Heart failure ([place])    a. EF 25-30% in 08/2019 with cath showing normal cors b. EF 20-25% in 09/2020 c. 30-35% in 02/2021 d. EF 20-25% in 10/2021   Hypertension    Pulmonary emboli ([place]) 03/2019   Renal insufficiency    Type 2 diabetes mellitus (HCC)        Objective:    Wts  04/22/2022        ***  03/08/2022       148   02/24/22 151 lb 3.2 oz (68.6 kg)  01/28/22 148 lb (67.1 kg)  12/18/21 149 lb (67.6 kg)    Vital signs reviewed  04/22/2022  - Note at rest 02 sats  ***% on ***   General appearance:    ***      Mild barr ***    I personally reviewed images and agree with radiology impression as follows:   Chest CT s contrast 02/27/22 vs 08/05/21 : Mild bilateral apical fibrosis. Bronchial wall  thickening consistent with chronic bronchitis. Focal areas of  consolidation in the lung bases surrounding areas of bronchiectasis.  Scattered fine tree-in-bud  infiltrates most prominent in the left  lower lung and lingula. Findings appear similar to previous study.         Assessment

## 2022-04-23 ENCOUNTER — Encounter: Payer: Self-pay | Admitting: Internal Medicine

## 2022-04-23 LAB — CBC WITH DIFFERENTIAL/PLATELET
Basophils Absolute: 0 10*3/uL (ref 0.0–0.2)
Basos: 0 %
EOS (ABSOLUTE): 0 10*3/uL (ref 0.0–0.4)
Eos: 0 %
Hematocrit: 31.8 % — ABNORMAL LOW (ref 37.5–51.0)
Hemoglobin: 9.8 g/dL — ABNORMAL LOW (ref 13.0–17.7)
Immature Grans (Abs): 0 10*3/uL (ref 0.0–0.1)
Immature Granulocytes: 0 %
Lymphocytes Absolute: 2.4 10*3/uL (ref 0.7–3.1)
Lymphs: 17 %
MCH: 27.8 pg (ref 26.6–33.0)
MCHC: 30.8 g/dL — ABNORMAL LOW (ref 31.5–35.7)
MCV: 90 fL (ref 79–97)
Monocytes Absolute: 0.8 10*3/uL (ref 0.1–0.9)
Monocytes: 6 %
Neutrophils Absolute: 10.9 10*3/uL — ABNORMAL HIGH (ref 1.4–7.0)
Neutrophils: 77 %
Platelets: 320 10*3/uL (ref 150–450)
RBC: 3.52 x10E6/uL — ABNORMAL LOW (ref 4.14–5.80)
RDW: 13.6 % (ref 11.6–15.4)
WBC: 14.1 10*3/uL — ABNORMAL HIGH (ref 3.4–10.8)

## 2022-04-23 LAB — BASIC METABOLIC PANEL
BUN/Creatinine Ratio: 15 (ref 10–24)
BUN: 25 mg/dL (ref 8–27)
CO2: 22 mmol/L (ref 20–29)
Calcium: 8.5 mg/dL — ABNORMAL LOW (ref 8.6–10.2)
Chloride: 107 mmol/L — ABNORMAL HIGH (ref 96–106)
Creatinine, Ser: 1.64 mg/dL — ABNORMAL HIGH (ref 0.76–1.27)
Glucose: 148 mg/dL — ABNORMAL HIGH (ref 70–99)
Potassium: 4.3 mmol/L (ref 3.5–5.2)
Sodium: 145 mmol/L — ABNORMAL HIGH (ref 134–144)
eGFR: 42 mL/min/{1.73_m2} — ABNORMAL LOW (ref >59)

## 2022-04-23 LAB — SEDIMENTATION RATE: Sed Rate: 6 mm/hr (ref 0–30)

## 2022-04-23 LAB — BRAIN NATRIURETIC PEPTIDE: BNP: 202 pg/mL — ABNORMAL HIGH (ref 0.0–100.0)

## 2022-04-23 NOTE — Assessment & Plan Note (Signed)
MBS/DgEs Ordered

## 2022-04-23 NOTE — Assessment & Plan Note (Signed)
Quit smoking 1978 PFT 11/16/13   FEV1 37%, ratio 61 , no sign BD response Decreased FVC 45% (19% BD response) . DLCO was unable to be done.  - pulmonary eval 01/28/2022 severe coughing on trelegy and entresto rec duoneb/flutter/mucinex dm and stop trelegy  Then ? Trial off entresto and on just valsartan?  - Allergy screen 01/28/22  >  Eos 0.2 /  IgE  7 - 02/24/2022 changed to bud/perfomist bid with prn duoneb/ 12 d pred taper and levquin 500 x 7days with f/u  q 2 weeks and strongly consider d/c entresto but EF only 25% - 02/24/2022  After extensive coaching inhaler device,  effectiveness =    0% with hfa  - 03/08/2022 d/c entresto (pseudowheeze) > improved 04/22/2022  - MBS/ DgEs   DDX of  difficult airways management almost all start with A and  include Adherence, Ace Inhibitors, Acid Reflux, Active Sinus Disease, Alpha 1 Antitripsin deficiency, Anxiety masquerading as Airways dz,  ABPA,  Allergy(esp in young), Aspiration (esp in elderly), Adverse effects of meds,  Active smoking or vaping, A bunch of PE's (a small clot burden can't cause this syndrome unless there is already severe underlying pulm or vascular dz with poor reserve) plus two Bs  = Bronchiectasis and Beta blocker use..and one C= CHF   Adherence is always the initial "prime suspect" and is a multilayered concern that requires a "trust but verify" approach in every patient - starting with knowing how to use medications, especially inhalers, correctly, keeping up with refills and understanding the fundamental difference between maintenance and prns vs those medications only taken for a very short course and then stopped and not refilled.  - still not able to be be sure what resp rx he's using at home, asked his daughter to call with the names of his nebs - return in 2 weeks with all meds in hand using a trust but verify approach to confirm accurate Medication  Reconciliation The principal here is that until we are certain that the  patients are  doing what we've asked, it makes no sense to ask them to do more.   ? Acid (or non-acid) GERD > always difficult to exclude as up to 75% of pts in some series report no assoc GI/ Heartburn symptoms> rec max (24h)  acid suppression and diet restrictions/ reviewed and instructions given in writing.   ? Aspiration (would explain the persistent infiltrates and recurrent "pna" as well as bronchiectasis > eval with swallowing studies avove  ? Adverse drug reaction to entresto > just leave on valsartan for now   Bronchiectasis > swallowing eval as above  CHF > recheck serial  bnp off entresto

## 2022-04-23 NOTE — Assessment & Plan Note (Addendum)
Placed on 02 around 2021  - 88% p walking 100 ft 01/28/2022 > rec 3lpm and titrate daytime to keep > 90% at all times as has chf  - 03/08/2022   Walked on RA   x  3  lap(s) =  approx 450  ft  @ moderately slow/cane pace, stopped due to end of study with lowest 02 sats 91% and sob p 1st lap but never stopped    Advised: Make sure you check your oxygen saturation  AT  your highest level of activity (not after you stop)   to be sure it stays over 90% and adjust  02 flow upward to maintain this level if needed but remember to turn it back to previous settings when you stop (to conserve your supply).           Each maintenance medication was reviewed in detail including emphasizing most importantly the difference between maintenance and prns and under what circumstances the prns are to be triggered using an action plan format where appropriate.  Total time for H and P, chart review, counseling, reviewing nebs/02 device(s) and generating customized AVS unique to this office visit / same day charting = 35 min post hosp f/u / transition of care.

## 2022-04-26 ENCOUNTER — Telehealth: Payer: Self-pay

## 2022-04-26 ENCOUNTER — Other Ambulatory Visit (HOSPITAL_COMMUNITY): Payer: Self-pay | Admitting: Internal Medicine

## 2022-04-26 DIAGNOSIS — R1319 Other dysphagia: Secondary | ICD-10-CM

## 2022-04-26 NOTE — Telephone Encounter (Signed)
Brovana shoud be twice daily   Albuterol is up to q 4 h prn

## 2022-04-26 NOTE — Telephone Encounter (Signed)
Patients daughter called and states that at last ov last week, Dr. Melvyn Novas wanted them to call back with neb solutions patient is using.   Daughter states he takes:   Albuterol sulfate 3 mL/ proventil 2 times daily   He also takes [place] and uses this prn.   Routing as FYI to Dr. Melvyn Novas. MAR updated

## 2022-04-27 ENCOUNTER — Other Ambulatory Visit: Payer: Self-pay | Admitting: Internal Medicine

## 2022-04-27 NOTE — Telephone Encounter (Signed)
Called and spoke to patients daughter about medication instructions. She voiced understanding and asked that we send a mychart message with response from Dr. Melvyn Novas regarding medication instructions.

## 2022-04-29 ENCOUNTER — Encounter (HOSPITAL_COMMUNITY): Payer: Self-pay | Admitting: Speech Pathology

## 2022-04-29 ENCOUNTER — Ambulatory Visit (HOSPITAL_COMMUNITY)
Admission: RE | Admit: 2022-04-29 | Discharge: 2022-04-29 | Disposition: A | Discharge status: Home / Self Care | Payer: Medicare HMO | Source: Ambulatory Visit | Attending: Internal Medicine | Admitting: Internal Medicine

## 2022-04-29 ENCOUNTER — Ambulatory Visit (HOSPITAL_COMMUNITY): Payer: Medicare HMO | Attending: Internal Medicine | Admitting: Speech Pathology

## 2022-04-29 ENCOUNTER — Other Ambulatory Visit: Payer: Self-pay

## 2022-04-29 DIAGNOSIS — R1319 Other dysphagia: Secondary | ICD-10-CM | POA: Insufficient documentation

## 2022-04-29 DIAGNOSIS — K224 Dyskinesia of esophagus: Secondary | ICD-10-CM

## 2022-04-29 DIAGNOSIS — R1312 Dysphagia, oropharyngeal phase: Secondary | ICD-10-CM | POA: Insufficient documentation

## 2022-04-29 NOTE — Therapy (Signed)
[place], [place], [address] Phone: [phone]   Fax:  [phone]  Modified Barium Swallow  Patient Details  Name: Gregory Mitchell MRN: [id number] Date of Birth: 02-27-1941 No data recorded  Encounter Date: 04/29/2022   End of Session - 04/29/22 1637     Visit Number 1    Number of Visits 1    Authorization Type [employer]    SLP Start Time 0915    SLP Stop Time  0940    SLP Time Calculation (min) 25 min    Activity Tolerance Patient tolerated treatment well             Past Medical History:  Diagnosis Date   Asthma    Chronic renal insufficiency    COPD (chronic obstructive pulmonary disease) ([place])    Heart failure ([place])    a. EF 25-30% in 08/2019 with cath showing normal cors b. EF 20-25% in 09/2020 c. 30-35% in 02/2021 d. EF 20-25% in 10/2021   Hypertension    Pulmonary emboli ([place]) 03/2019   Renal insufficiency    Type 2 diabetes mellitus ([place])     Past Surgical History:  Procedure Laterality Date   APPENDECTOMY     BALLOON DILATION N/A 03/18/2020   Procedure: BALLOON DILATION;  Surgeon: Eloise Harman, DO;  [facility];  Service: Endoscopy;  Laterality: N/A;   CHOLECYSTECTOMY     COLONOSCOPY  10/2006   Dr.Anwar:normal   ESOPHAGEAL BRUSHING  08/24/2021   Procedure: ESOPHAGEAL BRUSHING;  Surgeon: Eloise Harman, DO;  [facility];  Service: Endoscopy;;   ESOPHAGOGASTRODUODENOSCOPY (EGD) WITH PROPOFOL N/A 03/18/2020   Procedure: ESOPHAGOGASTRODUODENOSCOPY (EGD) WITH PROPOFOL;  Surgeon: Eloise Harman, DO;  [facility];  Service: Endoscopy;  Laterality: N/A;  2:30pm   ESOPHAGOGASTRODUODENOSCOPY (EGD) WITH PROPOFOL N/A 08/24/2021   Procedure: ESOPHAGOGASTRODUODENOSCOPY (EGD) WITH PROPOFOL;  Surgeon: Eloise Harman, DO;  [facility];  Service: Endoscopy;  Laterality: N/A;  with dilation   FLEXIBLE SIGMOIDOSCOPY N/A 08/24/2021    Procedure: FLEXIBLE SIGMOIDOSCOPY;  Surgeon: Eloise Harman, DO;  [facility];  Service: Endoscopy;  Laterality: N/A;   NASAL SINUS SURGERY     NEPHRECTOMY     RIGHT (Question CA.  No further therapy needed.)   RIGHT/LEFT HEART CATH AND CORONARY ANGIOGRAPHY N/A 09/03/2019   Procedure: RIGHT/LEFT HEART CATH AND CORONARY ANGIOGRAPHY;  Surgeon: Lorretta Harp, MD;  Location: [facility];  Service: Cardiovascular;  Laterality: N/A;    There were no vitals filed for this visit.        General - 04/29/22 1627       General Information   Date of Onset 04/22/22    HPI [name] is an 81 yo male who was referred for MBSS by Dr. Melvyn Novas due to concern for aspiration and Pt with report of globus sensation with swallow at times.    Type of Study MBS-Modified Barium Swallow Study    Diet Prior to this Study Regular;Thin liquids    Temperature Spikes Noted No    Respiratory Status Room air    History of Recent Intubation No    Behavior/Cognition Alert;Cooperative;Pleasant mood    Oral Cavity Assessment Within Functional Limits    Oral Care Completed by SLP No    Oral Cavity - Dentition Dentures, top;Dentures, bottom    Vision Functional for self feeding    Self-Feeding Abilities Able to  feed self    Patient Positioning Upright in chair    Baseline Vocal Quality Normal    Volitional Cough Strong    Volitional Swallow Able to elicit    Anatomy Within functional limits   cervical osteophytes   Pharyngeal Secretions Not observed secondary MBS                Oral Preparation/Oral Phase - 04/29/22 1629       Oral Preparation/Oral Phase   Oral Phase Impaired      Oral - Thin   Oral - Thin Teaspoon Oral residue   liquids collect in dentures   Oral - Thin Cup Oral residue    Oral - Thin Straw Oral aversion      Oral - Solids   Oral - Puree Decreased bolus cohesion;Piecemeal swallowing    Oral - Regular Within functional limits    Oral - Pill Within  functional limits      Electrical stimulation - Oral Phase   Was Electrical Stimulation Used No              Pharyngeal Phase - 04/29/22 1630       Pharyngeal Phase   Pharyngeal Phase Impaired      Pharyngeal - Thin   Pharyngeal- Thin Teaspoon Swallow initiation at pyriform sinus;Pharyngeal residue - valleculae    Pharyngeal- Thin Cup Swallow initiation at pyriform sinus;Pharyngeal residue - valleculae;Penetration/Aspiration during swallow    Pharyngeal Material enters airway, remains ABOVE vocal cords then ejected out    Pharyngeal- Thin Straw Swallow initiation at vallecula;Swallow initiation at pyriform sinus;Pharyngeal residue - valleculae;Other (Comment);Penetration/Apiration after swallow;Trace aspiration    Pharyngeal Material enters airway, passes BELOW cords and not ejected out despite cough attempt by patient      Pharyngeal - Solids   Pharyngeal- Puree Within functional limits    Pharyngeal- Regular Within functional limits    Pharyngeal- Pill Within functional limits      Electrical Stimulation - Pharyngeal Phase   Was Electrical Stimulation Used No              Cricopharyngeal Phase - 04/29/22 1632       Cervical Esophageal Phase   Cervical Esophageal Phase Impaired      Cervical Esophageal Phase - Thin   Thin Straw Esophageal backflow into the pharynx      Cervical Esophageal Phase - Solids   Pill Other (Comment)      Cervical Esophageal Phase - Comment   Other Esophageal Phase Observations Barium tablet initially dropped to at least the LES, however during additional imaging with straw sips of thin (Pt was belching), the barium tablet moved back up into the thoracic esophagus and did not traverse downward for the remainder of the study despite Pt being given sequential straw sips thin and several bites of puree. The Pt then aspirated a trace amount of thin liquids which elicited a cough but was not removed. Pt appeared to have min esophageal backflow  of thins in the pyriforms and then aspirated with additional sips.                Plan - 04/29/22 1637     Clinical Impression Statement Pt presents with mild oropharyngeal dysphagia with suspected primary esophageal dysphagia characterized by premature spillage of thins to the pyriforms with trigger at the level of the pyriforms with thins resulting in variable flash penetration during the swallow with large cup sips, min pooling in the valleculae post initial swallow from liquid  residue from dentures spilling down, however Pt clears with spontaneous secondary swallow. Pt swallowed the barium tablet with cup sips of thin barium with only flash penetration of thins during the swallow. The pill moved towards the LES, but then backflowed to the thoracic esophagus and could not be cleared for the remainder of the study despite puree wash and thin liquid wash. Pt had a single episode of trace aspiration during the swallow when taking sequential straw sips in effort to clear the barium tablet which resulted in a spontaneous cough, but could not be cleared by the Pt. Aspiration is thought to have occurred due to significant esophageal barium stacking due to pill stasis and retrograde movement of barium in the esophagus. Recommend self regulated regular or soft textures and thin liquids with aspiration and reflux precautions and consider crushing large pills as able and GI consult to see if EGD may be indicated due to stasis of barium tablet. Please see esophagram report which was also completed after MBSS.    Consulted and Agree with Plan of Care Patient             Patient will benefit from skilled therapeutic intervention in order to improve the following deficits and impairments:   Oropharyngeal dysphagia     Recommendations/Treatment - 04/29/22 1636       Swallow Evaluation Recommendations   Recommended Consults Consider GI evaluation    SLP Diet Recommendations Dysphagia 3 (mechanical  soft);Thin    Liquid Administration via Cup    Medication Administration Whole meds with liquid    Supervision Patient able to self feed    Compensations Small sips/bites;Multiple dry swallows after each bite/sip    Postural Changes Seated upright at 90 degrees;Remain upright for at least 30 minutes after feeds/meals              Prognosis - 04/29/22 1636       Prognosis   Prognosis for Safe Diet Advancement Good      Individuals Consulted   Consulted and Agree with Results and Recommendations Patient    Report Sent to  Referring physician             Problem List Patient Active Problem List   Diagnosis Date Noted   PNA (pneumonia) 04/14/2022   Speech abnormality 04/14/2022   Chronic respiratory failure with hypoxia ([place]) 01/28/2022   IDA (iron deficiency anemia) 11/23/2021   Constipation    Atypical chest pain 09/21/2021   Prolonged QT interval 09/21/2021   Pulmonary embolism ([place]) 09/21/2021   Acute bronchitis 09/21/2021   Macrocytic anemia 08/22/2021   Gastrointestinal hemorrhage 08/22/2021   Bacterial pneumonia 10/14/2020   History of pulmonary embolism 10/14/2020   Elevated troponin 10/14/2020   Community acquired pneumonia 10/14/2020   Pressure injury of skin 10/14/2020   Chronic kidney disease, stage 3a ([place]) 10/05/2020   Chronic combined systolic and diastolic CHF (congestive heart failure) ([place]) 10/05/2020   COPD with acute exacerbation ([place]) 10/05/2020   COVID-19 virus infection 10/04/2020   GERD (gastroesophageal reflux disease) 06/12/2020   Candidal esophagitis ([place]) 05/21/2020   Esophageal dysphagia 02/20/2020   Abdominal pain, epigastric 02/20/2020   Abnormal CT of liver 02/20/2020   AKI (acute kidney injury) ([place]) 09/04/2019   Acute on chronic systolic CHF (congestive heart failure) ([place]) 09/04/2019   Trifascicular block 09/04/2019   Acute on chronic respiratory failure with hypoxia ([place]) 08/31/2019   Acute on chronic diastolic CHF  (congestive heart failure) ([place]) 08/31/2019   COPD GOLD 3 /  obstructive bronchiectasis by HRCT in 2015  12/21/2013   Bronchiectasis ([place]) 12/21/2013   Dyspnea on exertion 08/23/2012   Essential hypertension 08/23/2012   Fever 02/11/2012   Obesity 01/11/2012   Chest pain 11/29/2011   Cardiomyopathy, secondary ([place]) 11/29/2011   Chronic renal insufficiency    Type 2 diabetes mellitus [facility])    Thank you,  Genene Churn, [place]  Teddy Spike 04/29/2022, 4:38 PM  [place], [place], [address] Phone: [phone]   Fax:  [phone]  Name: NAYDEN CZAJKA MRN: [id number] Date of Birth: 1941/07/18

## 2022-05-06 ENCOUNTER — Ambulatory Visit: Payer: Medicare HMO | Admitting: Internal Medicine

## 2022-05-06 ENCOUNTER — Encounter: Payer: Self-pay | Admitting: Internal Medicine

## 2022-05-06 DIAGNOSIS — R058 Other specified cough: Secondary | ICD-10-CM

## 2022-05-06 DIAGNOSIS — J9611 Chronic respiratory failure with hypoxia: Secondary | ICD-10-CM | POA: Diagnosis not present

## 2022-05-06 DIAGNOSIS — J449 Chronic obstructive pulmonary disease, unspecified: Secondary | ICD-10-CM | POA: Diagnosis not present

## 2022-05-06 DIAGNOSIS — R1319 Other dysphagia: Secondary | ICD-10-CM | POA: Diagnosis not present

## 2022-05-06 NOTE — Patient Instructions (Addendum)
My office will be contacting you by phone for referral to ENT   - if you don't hear back from my office within one week please call us back or notify us thru MyChart and we'll address it right away.   Use purse lip breathing when you wheeze  For cough > mucinex dm 1200 mg every 12 hours and use flutter valve as much as possible    Plan A = Automatic = Always=    Aformoterol/bud 1st thing in am and 12 hours later   Plan B = Backup (to supplement plan A, not to replace it) Only use your ipatropium/albuterol inhaler as a rescue medication to be used if you can't catch your breath by resting or doing a relaxed purse lip breathing pattern.  - The less you use it, the better it will work when you need it. - Ok to use the inhaler up   every 4 hours if you must but call for appointment if use goes up over your usual need  If worse > Prednisone 10 mg take  4 each am x 2 days,   2 each am x 2 days,  1 each am x 2 days and stop     Please schedule a follow up visit in 2 months but call sooner if needed

## 2022-05-06 NOTE — Progress Notes (Signed)
Gregory Mitchell, male    DOB: [birth year]    MRN: [id number]   Brief patient profile:  34   yobm  quit smoking 1978 no apparent  resp sequelae referred to pulmonary clinic in [facility]  01/28/2022 for copd eval  by Gregory Pho PA following in cards clinic for severe chf     Prior pulmonary eval 2015 c/o doe x 2 y with GOLD 3 criteria at that point with hrct 11/16/13 with bronchiectasis ? MAI   Rx 02 around 2021  ? After covid ?   History of Present Illness  01/28/2022  Pulmonary/ 1st office eval/ Gregory Mitchell / Gregory [facility] / trelegy maint Chief Complaint  Patient presents with   Consult    Ref by cardiology for copd  does have O2 at home he uses prn   Dyspnea:  maybe 100 ft at grocery handicap parking  Cough: x  years worse x one year p covid 19 in 05/2020 along with 02 need assoc with hoarsness / min  white mucus production worse in ams Sleep:on side flat bed 2 pillows  SABA use: not really helping  02 :  3lpm hs and prn but not titrating  Rec Stop trelegy and the ventolin / albuterol  Pantoprazole 40 mg Take 30- 60 min before your first and last meals of the day  Take duoneb  four times daily  Add budesonide 0.25 mg twice daily to the nebulizer  Prednisone 10 mg take  4 each am x 2 days,   2 each am x 2 days,  1 each am x 2 days and stop     Allergy screen 01/28/22  >  Eos 0.2 /  IgE  / ESR 41.  Wear 02 so you keep your 02 saturation s above 90% at all time  For cough > mucinex 1200 mg every 12 hours and Use the flutter valve as much possible Please schedule a follow up office visit in 4 weeks, sooner if needed  with all medications /inhalers/ solutions     02/24/2022  f/u ov/[place] office/Gregory Mitchell re: copd/uacs  maint on nebs but confused on which ones when / also still on entresto   Chief Complaint  Patient presents with   Follow-up    Feels breathing is getting worse.   Dyspnea:  [employer] leaning on cart/ s 02  Cough: yellowish better p levaquin then worse again   Sleeping: on bed is level/flat with 2 pillows  SABA use: not much hfa 02: 4lpm 24/7  Covid status: vax x 2  Rec Protonix 40 mg Take 30- 60 min before your first and last meals of the day  - new prescription today  Prednisone Take 4 for three days 3 for three days 2 for three days 1 for three days and stop  Levaquin 500 mg daily  x 7 days  Plan A = Automatic = Always=    Aformoterol 15 mcg 1st thing in am and 12 hours and budsonide  0.5  Plan B = Backup (to supplement plan A, not to replace it) Only use your albuterol nebulizer  as a rescue medication Stop fish oil  GERD diet     Admit date: 02/26/2022 Discharge date: 03/02/2022  Discharge to: Home with home health to follow-up with pulmonology and cardiology as well as PCP Discharge Diagnoses:  Pneumonia of left lower lobe due to infectious organism COPD (chronic obstructive pulmonary disease) (CMS-HCC) Heart failure with reduced ejection fraction (CMS-HCC) History of pulmonary embolus (  PE) Diabetes mellitus type 2, noninsulin dependent (CMS-HCC) Dysphagia Resolved Problems: Shortness of breath TIA (transient ischemic attack) Atypical chest pain   Procedures: CT head 7/22 CT abdomen and pelvis, chest 7/22 Chest x-ray 7/22 MRI brain 7/24 V/Q scan 7/24 Venous duplex bilateral lower extremities 7/24       03/08/2022  f/u ov/[place] office/Gregory Mitchell re: GOLD 3  maint on formoterol  but thoroughly confused with meds/ instructoins from [facility] [place] Complaint  Patient presents with   Follow-up    Patient feels he is about the same since last visit 2 weeks ago  Dyspnea:  walked in with cane x 50 fts  Cough: yellow mucus looks the same on / off levaquin Sleeping: 4lpm and flat bed/ 2 pillows  SABA use: confused  02: 4lpm hs and prn  Covid status: vax x 3  Rec Protonix (pantoprazole) 40 mg Take 30- 60 min before your first and last meals of the day  - new prescription today  Plan A = Automatic = Always=    Aformoterol 15  mcg 1st thing in am and 12 hours and budsonide 0.25 twice daily  Plan B = Backup (to supplement plan A, not to replace it) Only use your albuterol nebulizer  as a rescue medication  GERD rx Stop entresto  Start valsartan  For cough > mucinex dm 1200 mg every 12 hours and use flutter valve as much as possible   Please schedule a follow up office visit in 4 weeks, sooner if needed  with all medications /inhalers/ solutions/ flutter valve in hand       Discharge Date:  04/16/2022  1)Please talk to your cardiologist about restarting SACUBITRIL-VALSARTAN 24-26 MG PO TABS/Entresto.  Which is a medication for heart failure.. And possibly restarting apixaban/Eliquis which is a blood thinner 2)Avoid ibuprofen/Advil/Aleve/Motrin/Goody Powders/Naproxen/BC powders/Meloxicam/Diclofenac/Indomethacin and other Nonsteroidal anti-inflammatory medications as these will make you more likely to bleed and can cause stomach ulcers, can also cause Kidney problems.  3)You need oxygen at home at  3 to 4 L via nasal cannula continuously while awake and while asleep--- smoking or having open fires around oxygen can cause fire, significant injury and death 4)Repeat CBC and BMP in about a week or so   5)Very low-salt diet advised--- very low-sodium diet advised   6)Weigh yourself daily, call if you gain more than 3 pounds in 1 day or more than 5 pounds in 1 week as your diuretic medications may need to be adjusted     Admission Diagnosis  COPD exacerbation ([place]) [J44.1] Acute respiratory failure with hypoxia ([place]) [J96.01] PNA (pneumonia) [J18.9]     Discharge Diagnosis  COPD exacerbation ([place]) [J44.1] Acute respiratory failure with hypoxia ([facility]) [J96.01] PNA (pneumonia) [J18.9]     Principal Problem:   PNA (pneumonia)   Type 2 diabetes mellitus ([place])   Essential hypertension   COPD GOLD 3 / obstructive bronchiectasis by HRCT in 2015    Chronic kidney disease, stage 3a ([place])   Chronic combined systolic and  diastolic CHF (congestive heart failure) ([place])   Pulmonary embolism ([facility])   Chronic respiratory failure with hypoxia ([place])   Speech abnormality   04/22/2022  post hosp x 2 f/u ov/ office/Gregory Mitchell re: GOLD 3  maint on confused again re nebs/inhalers - uses bubble pack for pills and did bring the receipts for those   Chief Complaint  Patient presents with   Follow-up    He has improved some since last OV.   Dyspnea:  food  lion walking pushing cart s 02  Cough: some better off entresto  Sleeping: flat bed 2 pillows ok  SABA use: twice a daily inhaler/ also neb but not sure what  02: 3lpm 24/7  Rec Continue budesonide with the other nebulizer solution twice daily but call me with the name Work on inhaler technique: Make sure you check your oxygen saturation  AT  your highest level of activity (not after you stop)   to be sure it stays over 90%        05/06/2022  f/u ov/[place] office/Gregory Mitchell re: GOLD 3 copd  maint on performist/ budesonide   Chief Complaint  Patient presents with   Follow-up    Breathing is not doing well today   Dyspnea:  did food lion one week prior to OV  /  50 ft to mailbox s 02 Cough: no  Sleeping: flat bed 2pillows  SABA use: last used weeks  02: 3lpm hs and prn daytime  Covid status: vax x 2 or 3  Nose drips a lot / nothing purulent      No obvious day to day or daytime variability or assoc excess/ purulent sputum or mucus plugs or hemoptysis or cp or chest tightness,  or overt  hb symptoms.   Sleeping as above without nocturnal  or early am exacerbation  of respiratory  c/o's or need for noct saba. Also denies any obvious fluctuation of symptoms with weather or environmental changes or other aggravating or alleviating factors except as outlined above   No unusual exposure hx or h/o childhood pna/ asthma or knowledge of premature birth.  Current Allergies, Complete Past Medical History, Past Surgical History, Family History, and Social History  were reviewed in [employer] record.  ROS  The following are not active complaints unless bolded Hoarseness, sore throat, dysphagia, dental problems, itching, sneezing,  nasal congestion or discharge of excess mucus or purulent secretions, ear ache,   fever, chills, sweats, unintended wt loss or wt gain, classically pleuritic or exertional cp,  orthopnea pnd or arm/hand swelling  or leg swelling, presyncope, palpitations, abdominal pain, anorexia, nausea, vomiting, diarrhea  or change in bowel habits or change in bladder habits, change in stools or change in urine, dysuria, hematuria,  rash, arthralgias, visual complaints, headache, numbness, weakness or ataxia or problems with walking or coordination,  change in mood or  memory.        Current Meds  Medication Sig   albuterol (PROVENTIL) (2.5 MG/3ML) 0.083% nebulizer solution Take 3 mLs (2.5 mg total) by nebulization every 4 (four) hours as needed for wheezing or shortness of breath.   atorvastatin (LIPITOR) 80 MG tablet Take 80 mg by mouth every evening.   budesonide (PULMICORT) 0.25 MG/2ML nebulizer solution Take 2 mLs (0.25 mg total) by nebulization 2 (two) times daily.   dapagliflozin propanediol (FARXIGA) 10 MG TABS tablet Take 1 tablet (10 mg total) by mouth daily before breakfast.   guaiFENesin (MUCINEX) 600 MG 12 hr tablet Take 1 tablet (600 mg total) by mouth 2 (two) times daily.   HM SENNA-S 8.6-50 MG tablet Take 2 tablets by mouth daily.   HYDROcodone-acetaminophen (NORCO) 7.5-325 MG tablet Take 1 tablet by mouth 2 (two) times daily as needed for moderate pain.   lactulose (CHRONULAC) 10 GM/15ML solution Take 30 mLs (20 g total) by mouth 3 (three) times daily as needed for mild constipation.   metoprolol succinate (TOPROL XL) 25 MG 24 hr tablet Take 1 tablet (25 mg  total) by mouth daily.   mirtazapine (REMERON) 15 MG tablet Take 15 mg by mouth daily.   OXYGEN Inhale 4 L into the lungs daily as needed (for  shortness of breath).   pantoprazole (PROTONIX) 40 MG tablet Take 1 tablet (40 mg total) by mouth daily.   tamsulosin (FLOMAX) 0.4 MG CAPS capsule Take 0.8 mg by mouth 2 (two) times daily.   torsemide (DEMADEX) 20 MG tablet Take 1 tablet (20 mg total) by mouth as needed (swelling).                     Past Medical History:  Diagnosis Date   Asthma    Chronic renal insufficiency    COPD (chronic obstructive pulmonary disease) (HCC)    Heart failure ([place])    a. EF 25-30% in 08/2019 with cath showing normal cors b. EF 20-25% in 09/2020 c. 30-35% in 02/2021 d. EF 20-25% in 10/2021   Hypertension    Pulmonary emboli ([place]) 03/2019   Renal insufficiency    Type 2 diabetes mellitus (HCC)        Objective:    Wts  05/06/2022       139 04/22/2022       144  03/08/2022       148   02/24/22 151 lb 3.2 oz (68.6 kg)  01/28/22 148 lb (67.1 kg)  12/18/21 149 lb (67.6 kg)     Vital signs reviewed  05/06/2022  - Note at rest 02 sats  95% on RA   General appearance:    elderly bm chronically ill wals with walker suction nasal tone/edentulous / classic pseudowheeze better with plm    HEENT : Oropharynx  clear   Nasal turbinates mod edema   NECK :  without  apparent JVD/ palpable Nodes/TM    LUNGS: no acc muscle use,  Mild barrel  contour chest wall with bilateral  Distant bs s audible wheeze and  without cough on insp or exp maneuvers  and mild  Hyperresonant  to  percussion bilaterally     CV:  RRR  no s3 or murmur or increase in P2, and no edema   ABD:  soft and nontender with pos end  insp Hoover's  in the supine position.  No bruits or organomegaly appreciated   MS:    ext warm without deformities Or obvious joint restrictions  calf tenderness, cyanosis or clubbing     SKIN: warm and dry without lesions    NEURO:  alert, approp, nl sensorium with  no motor or cerebellar deficits apparent.          I personally reviewed images and agree with radiology impression as  follows:  CT9/6/23 There is marked severity bilateral maxillary sinus and bilateral ethmoid sinus mucosal thickening.          Assessment

## 2022-05-06 NOTE — Assessment & Plan Note (Addendum)
CT 04/14/22 There is marked severity bilateral maxillary sinus and bilateral ethmoid sinus mucosal thickening. - referred to ENT 05/06/2022 >>>   Each maintenance medication was reviewed in detail including emphasizing most importantly the difference between maintenance and prns and under what circumstances the prns are to be triggered using an action plan format where appropriate.  Total time for H and P, chart review, counseling, reviewing neb/02 device(s) , directly observing portions of ambulatory 02 saturation study/ and generating customized AVS unique to this office visit / same day charting  > 30 min for multiple  refractory respiratory  symptoms of uncertain etiology

## 2022-05-07 ENCOUNTER — Encounter: Payer: Self-pay | Admitting: Internal Medicine

## 2022-05-07 NOTE — Assessment & Plan Note (Signed)
Placed on 02 around 2021  - 88% p walking 100 ft 01/28/2022 > rec 3lpm and titrate daytime to keep > 90% at all times as has chf  - 03/08/2022   Walked on RA   x  3  lap(s) =  approx 450  ft  @ moderately slow/cane pace, stopped due to end of study with lowest 02 sats 91% and sob p 1st lap but never stopped   - 05/06/2022   Walked on RA  x  1  lap(s) =  approx 150  ft  @ slow/cane assist pace, stopped due to tired > sob with lowest 02 sats 94%   rec continue 02 3lpm hs   Make sure you check your oxygen saturation at your highest level of activity to be sure it stays over 90% and keep track of it at least once a week, more often if breathing getting worse, and let me know if losing ground.

## 2022-05-07 NOTE — Assessment & Plan Note (Signed)
MBS 04/29/22  Recommended Consults Consider GI evaluation    SLP Diet Recommendations Dysphagia 3 (mechanical soft);Thin    Liquid Administration via Cup    Medication Administration Whole meds with liquid    Supervision Patient able to self feed    Compensations Small sips/bites;Multiple dry swallows after each bite/sip    Postural Changes Seated upright at 90 degrees;Remain upright for at least 30 minutes after feeds/meals  04/29/22 DgEs  1. Mild midthoracic esophageal narrowing, cannot exclude Barretts esophagus. A barium pill left over from the modified barium swallow all remained impacted just above this area of subtle narrowing. No significant mucosal irregularity observed. Endoscopy may be warranted. 2. Nonspecific esophageal dysmotility disorder, with the distal esophagus clearing primarily due to secondary contractions. Fold thickening in the distal esophagus noted suggesting esophagitis. 3. We were not able to fully distend the distal esophagus, cannot exclude smooth stricturing. 4. Small type 1 hiatal hernia.>>>  Referred to GI [place]

## 2022-05-07 NOTE — Assessment & Plan Note (Signed)
Quit smoking 1978 PFT 11/16/13   FEV1 37%, ratio 61 , no sign BD response Decreased FVC 45% (19% BD response) . DLCO was unable to be done.  - pulmonary eval 01/28/2022 severe coughing on trelegy and entresto rec duoneb/flutter/mucinex dm and stop trelegy  Then ? Trial off entresto and on just valsartan?  - Allergy screen 01/28/22  >  Eos 0.2 /  IgE  7 - 02/24/2022 changed to bud/perfomist bid with prn duoneb/ 12 d pred taper and levquin 500 x 7days with f/u  q 2 weeks and strongly consider d/c entresto but EF only 25% - 02/24/2022  After extensive coaching inhaler device,  effectiveness =    0% with hfa  - 03/08/2022 d/c entresto (pseudowheeze) > improved 04/22/2022  - MBS/ DgEs 04/23/2022 >>>see UACS   Relatively well compensated on laba/ics > no change rx but reminded him again of contingencies eg duoneb up to q 4 h prn and if use goes up then Prednisone 10 mg take  4 each am x 2 days,   2 each am x 2 days,  1 each am x 2 days and stop

## 2022-05-12 ENCOUNTER — Ambulatory Visit: Payer: Medicare HMO | Admitting: Internal Medicine

## 2022-05-14 ENCOUNTER — Telehealth: Payer: Self-pay | Admitting: Internal Medicine

## 2022-05-14 MED ORDER — PREDNISONE 10 MG PO TABS: ORAL_TABLET | ORAL | 0 refills | Status: AC

## 2022-05-14 NOTE — Telephone Encounter (Signed)
Per lov note: "Prednisone 10 mg take  4 each am x 2 days,   2 each am x 2 days,  1 each am x 2 days and stop" for plan D if patient is not feeling better. Rx sent to [facility]. Called and notified patients daughter

## 2022-05-18 ENCOUNTER — Inpatient Hospital Stay: Payer: Medicare HMO | Admitting: Internal Medicine

## 2022-05-28 ENCOUNTER — Other Ambulatory Visit: Payer: Self-pay

## 2022-05-28 ENCOUNTER — Encounter (HOSPITAL_COMMUNITY): Payer: Self-pay | Admitting: Emergency Medicine

## 2022-05-28 ENCOUNTER — Emergency Department (HOSPITAL_COMMUNITY)
Admission: EM | Admit: 2022-05-28 | Discharge: 2022-05-29 | Disposition: A | Discharge status: Home / Self Care | Payer: Medicare HMO | Attending: Emergency Medicine | Admitting: Emergency Medicine

## 2022-05-28 ENCOUNTER — Emergency Department (HOSPITAL_COMMUNITY): Payer: Medicare HMO

## 2022-05-28 DIAGNOSIS — I5042 Chronic combined systolic (congestive) and diastolic (congestive) heart failure: Secondary | ICD-10-CM | POA: Insufficient documentation

## 2022-05-28 DIAGNOSIS — Z79899 Other long term (current) drug therapy: Secondary | ICD-10-CM | POA: Insufficient documentation

## 2022-05-28 DIAGNOSIS — K4090 Unilateral inguinal hernia, without obstruction or gangrene, not specified as recurrent: Secondary | ICD-10-CM | POA: Diagnosis not present

## 2022-05-28 DIAGNOSIS — J441 Chronic obstructive pulmonary disease with (acute) exacerbation: Secondary | ICD-10-CM

## 2022-05-28 DIAGNOSIS — I130 Hypertensive heart and chronic kidney disease with heart failure and stage 1 through stage 4 chronic kidney disease, or unspecified chronic kidney disease: Secondary | ICD-10-CM | POA: Diagnosis not present

## 2022-05-28 DIAGNOSIS — Z8616 Personal history of COVID-19: Secondary | ICD-10-CM | POA: Insufficient documentation

## 2022-05-28 DIAGNOSIS — N1831 Chronic kidney disease, stage 3a: Secondary | ICD-10-CM | POA: Insufficient documentation

## 2022-05-28 DIAGNOSIS — Z20822 Contact with and (suspected) exposure to covid-19: Secondary | ICD-10-CM | POA: Insufficient documentation

## 2022-05-28 DIAGNOSIS — E119 Type 2 diabetes mellitus without complications: Secondary | ICD-10-CM | POA: Insufficient documentation

## 2022-05-28 DIAGNOSIS — J45909 Unspecified asthma, uncomplicated: Secondary | ICD-10-CM | POA: Diagnosis not present

## 2022-05-28 DIAGNOSIS — Z87891 Personal history of nicotine dependence: Secondary | ICD-10-CM | POA: Diagnosis not present

## 2022-05-28 DIAGNOSIS — Z7984 Long term (current) use of oral hypoglycemic drugs: Secondary | ICD-10-CM | POA: Insufficient documentation

## 2022-05-28 DIAGNOSIS — R0602 Shortness of breath: Secondary | ICD-10-CM | POA: Diagnosis present

## 2022-05-28 DIAGNOSIS — Z7951 Long term (current) use of inhaled steroids: Secondary | ICD-10-CM | POA: Diagnosis not present

## 2022-05-28 LAB — BLOOD GAS, VENOUS
Acid-Base Excess: 5.4 mmol/L — ABNORMAL HIGH (ref 0.0–2.0)
Bicarbonate: 29 mmol/L — ABNORMAL HIGH (ref 20.0–28.0)
Drawn by: 1517
FIO2: 32 %
O2 Saturation: 89.5 %
Patient temperature: 36.7
pCO2, Ven: 38 mmHg — ABNORMAL LOW (ref 44–60)
pH, Ven: 7.49 — ABNORMAL HIGH (ref 7.25–7.43)
pO2, Ven: 54 mmHg — ABNORMAL HIGH (ref 32–45)

## 2022-05-28 LAB — URINALYSIS, ROUTINE W REFLEX MICROSCOPIC
Bilirubin Urine: NEGATIVE
Glucose, UA: NEGATIVE mg/dL
Hgb urine dipstick: NEGATIVE
Ketones, ur: NEGATIVE mg/dL
Leukocytes,Ua: NEGATIVE
Nitrite: NEGATIVE
Protein, ur: NEGATIVE mg/dL
Specific Gravity, Urine: 1.014 (ref 1.005–1.030)
pH: 6 (ref 5.0–8.0)

## 2022-05-28 LAB — BASIC METABOLIC PANEL
Anion gap: 6 (ref 5–15)
BUN: 9 mg/dL (ref 8–23)
CO2: 27 mmol/L (ref 22–32)
Calcium: 8.6 mg/dL — ABNORMAL LOW (ref 8.9–10.3)
Chloride: 108 mmol/L (ref 98–111)
Creatinine, Ser: 0.97 mg/dL (ref 0.61–1.24)
GFR, Estimated: >60 mL/min (ref >60)
Glucose, Bld: 102 mg/dL — ABNORMAL HIGH (ref 70–99)
Potassium: 3.7 mmol/L (ref 3.5–5.1)
Sodium: 141 mmol/L (ref 135–145)

## 2022-05-28 LAB — RESP PANEL BY RT-PCR (FLU A&B, COVID) ARPGX2
Influenza A by PCR: NEGATIVE
Influenza B by PCR: NEGATIVE
SARS Coronavirus 2 by RT PCR: NEGATIVE

## 2022-05-28 LAB — TROPONIN I (HIGH SENSITIVITY)
Troponin I (High Sensitivity): 26 ng/L — ABNORMAL HIGH (ref <18)
Troponin I (High Sensitivity): 26 ng/L — ABNORMAL HIGH (ref <18)

## 2022-05-28 LAB — D-DIMER, QUANTITATIVE: D-Dimer, Quant: 1.83 ug/mL-FEU — ABNORMAL HIGH (ref 0.00–0.50)

## 2022-05-28 LAB — CBC
HCT: 28.3 % — ABNORMAL LOW (ref 39.0–52.0)
Hemoglobin: 8.2 g/dL — ABNORMAL LOW (ref 13.0–17.0)
MCH: 28 pg (ref 26.0–34.0)
MCHC: 29 g/dL — ABNORMAL LOW (ref 30.0–36.0)
MCV: 96.6 fL (ref 80.0–100.0)
Platelets: 201 10*3/uL (ref 150–400)
RBC: 2.93 MIL/uL — ABNORMAL LOW (ref 4.22–5.81)
RDW: 14.3 % (ref 11.5–15.5)
WBC: 7.6 10*3/uL (ref 4.0–10.5)
nRBC: 0 % (ref 0.0–0.2)

## 2022-05-28 MED ORDER — AMOXICILLIN-POT CLAVULANATE 875-125 MG PO TABS: 1.0000 | ORAL_TABLET | Freq: Two times a day (BID) | ORAL | 0 refills | Status: DC

## 2022-05-28 MED ORDER — IPRATROPIUM-ALBUTEROL 0.5-2.5 (3) MG/3ML IN SOLN
3.0000 mL | Freq: Once | RESPIRATORY_TRACT | Status: AC
  Administered 2022-05-28: 3 mL via RESPIRATORY_TRACT
  Filled 2022-05-28: qty 3

## 2022-05-28 MED ORDER — DOXYCYCLINE HYCLATE 100 MG PO CAPS: 100.0000 mg | ORAL_CAPSULE | Freq: Two times a day (BID) | ORAL | 0 refills | Status: DC

## 2022-05-28 MED ORDER — IOHEXOL 300 MG/ML  SOLN
100.0000 mL | Freq: Once | INTRAMUSCULAR | Status: AC | PRN
  Administered 2022-05-28: 100 mL via INTRAVENOUS

## 2022-05-28 MED ORDER — PREDNISONE 50 MG PO TABS: 50.0000 mg | ORAL_TABLET | Freq: Every day | ORAL | 0 refills | Status: DC

## 2022-05-28 MED ORDER — METHYLPREDNISOLONE SODIUM SUCC 125 MG IJ SOLR
125.0000 mg | Freq: Once | INTRAMUSCULAR | Status: AC
  Administered 2022-05-28: 125 mg via INTRAVENOUS
  Filled 2022-05-28: qty 2

## 2022-05-28 MED ORDER — ALBUTEROL SULFATE (2.5 MG/3ML) 0.083% IN NEBU
INHALATION_SOLUTION | RESPIRATORY_TRACT | Status: AC
  Administered 2022-05-28: 2.5 mg
  Filled 2022-05-28: qty 3

## 2022-05-28 NOTE — ED Provider Notes (Signed)
[facility] EMERGENCY DEPARTMENT Provider Note  CSN: [id number] Arrival date & time: 05/28/22 1702  Chief Complaint(s) Chest Pain  HPI DEANE WATTENBARGER is a 81 y.o. male with history of COPD, CHF, PE presenting to the emergency department with chest pain and shortness of breath.  Patient reports he initially developed shortness of breath today, also reports occasional midline chest pain, reports it is mildly pleuritic.  He reports compliance with his home inhalers and medications as well as nebulization treatments.  He is on his regular home oxygen.  Denies any leg swelling.  No fevers or chills, no productive cough.  Symptoms are mild.  He also reports lower abdominal pain and dysuria recently.,  No diarrhea, nausea or vomiting, blood in the stool, hematochezia.   Past Medical History Past Medical History:  Diagnosis Date   Asthma    Chronic renal insufficiency    COPD (chronic obstructive pulmonary disease) (HCC)    Heart failure (HCC)    a. EF 25-30% in 08/2019 with cath showing normal cors b. EF 20-25% in 09/2020 c. 30-35% in 02/2021 d. EF 20-25% in 10/2021   Hypertension    Pulmonary emboli ([place]) 03/2019   Renal insufficiency    Type 2 diabetes mellitus ([place])    Patient Active Problem List   Diagnosis Date Noted   Upper airway cough syndrome 05/06/2022   PNA (pneumonia) 04/14/2022   Speech abnormality 04/14/2022   Chronic respiratory failure with hypoxia ([place]) 01/28/2022   IDA (iron deficiency anemia) 11/23/2021   Constipation    Atypical chest pain 09/21/2021   Prolonged QT interval 09/21/2021   Pulmonary embolism ([place]) 09/21/2021   Acute bronchitis 09/21/2021   Macrocytic anemia 08/22/2021   Gastrointestinal hemorrhage 08/22/2021   Bacterial pneumonia 10/14/2020   History of pulmonary embolism 10/14/2020   Elevated troponin 10/14/2020   Community acquired pneumonia 10/14/2020   Pressure injury of skin 10/14/2020   Chronic kidney disease, stage 3a ([place]) 10/05/2020    Chronic combined systolic and diastolic CHF (congestive heart failure) ([place]) 10/05/2020   COPD with acute exacerbation ([place]) 10/05/2020   COVID-19 virus infection 10/04/2020   GERD (gastroesophageal reflux disease) 06/12/2020   Candidal esophagitis ([place]) 05/21/2020   Esophageal dysphagia 02/20/2020   Abdominal pain, epigastric 02/20/2020   Abnormal CT of liver 02/20/2020   AKI (acute kidney injury) ([place]) 09/04/2019   Acute on chronic systolic CHF (congestive heart failure) ([place]) 09/04/2019   Trifascicular block 09/04/2019   Acute on chronic respiratory failure with hypoxia ([place]) 08/31/2019   Acute on chronic diastolic CHF (congestive heart failure) ([place]) 08/31/2019   COPD GOLD 3 / obstructive bronchiectasis by HRCT in 2015  12/21/2013   Bronchiectasis ([place]) 12/21/2013   Dyspnea on exertion 08/23/2012   Essential hypertension 08/23/2012   Fever 02/11/2012   Obesity 01/11/2012   Chest pain 11/29/2011   Cardiomyopathy, secondary ([place]) 11/29/2011   Chronic renal insufficiency    Type 2 diabetes mellitus ([place])    Home Medication(s) Prior to Admission medications   Medication Sig Start Date End Date Taking? Authorizing Provider  amoxicillin-clavulanate (AUGMENTIN) 875-125 MG tablet Take 1 tablet by mouth every 12 (twelve) hours. 05/28/22  Yes Cristie Hem, MD  doxycycline (VIBRAMYCIN) 100 MG capsule Take 1 capsule (100 mg total) by mouth 2 (two) times daily. 05/28/22  Yes Cristie Hem, MD  predniSONE (DELTASONE) 50 MG tablet Take 1 tablet (50 mg total) by mouth daily for 4 days. 05/29/22 06/02/22 Yes Cristie Hem, MD  albuterol (PROVENTIL) (2.5  MG/3ML) 0.083% nebulizer solution Take 3 mLs (2.5 mg total) by nebulization every 4 (four) hours as needed for wheezing or shortness of breath. 04/16/22 04/16/23  Roxan Hockey, MD  atorvastatin (LIPITOR) 80 MG tablet Take 80 mg by mouth every evening. 04/10/22   [provider]  budesonide (PULMICORT) 0.25 MG/2ML nebulizer  solution Take 2 mLs (0.25 mg total) by nebulization 2 (two) times daily. 01/28/22   Tanda Rockers, MD  dapagliflozin propanediol (FARXIGA) 10 MG TABS tablet Take 1 tablet (10 mg total) by mouth daily before breakfast. 02/25/22   Arnoldo Lenis, MD  guaiFENesin (MUCINEX) 600 MG 12 hr tablet Take 1 tablet (600 mg total) by mouth 2 (two) times daily. 04/16/22 04/16/23  Roxan Hockey, MD  HM SENNA-S 8.6-50 MG tablet Take 2 tablets by mouth daily. 07/10/21   [provider]  HYDROcodone-acetaminophen (NORCO) 7.5-325 MG tablet Take 1 tablet by mouth 2 (two) times daily as needed for moderate pain. 11/27/20   [provider]  lactulose (CHRONULAC) 10 GM/15ML solution Take 30 mLs (20 g total) by mouth 3 (three) times daily as needed for mild constipation. 03/13/22   Mahala Menghini, PA-C  metoprolol succinate (TOPROL XL) 25 MG 24 hr tablet Take 1 tablet (25 mg total) by mouth daily. 12/18/21   Strader, Fransisco Hertz, PA-C  mirtazapine (REMERON) 15 MG tablet Take 15 mg by mouth daily.    [provider]  OXYGEN Inhale 4 L into the lungs daily as needed (for shortness of breath).    [provider]  pantoprazole (PROTONIX) 40 MG tablet Take 1 tablet (40 mg total) by mouth daily. 04/16/22   Roxan Hockey, MD  tamsulosin (FLOMAX) 0.4 MG CAPS capsule Take 0.8 mg by mouth 2 (two) times daily.    [provider]  torsemide (DEMADEX) 20 MG tablet Take 1 tablet (20 mg total) by mouth as needed (swelling). 03/23/22   Arnoldo Lenis, MD                                                                                                                                    Past Surgical History Past Surgical History:  Procedure Laterality Date   APPENDECTOMY     BALLOON DILATION N/A 03/18/2020   Procedure: BALLOON DILATION;  Surgeon: Eloise Harman, DO;  [facility];  Service: Endoscopy;  Laterality: N/A;   CHOLECYSTECTOMY     COLONOSCOPY  10/2006   Dr.Anwar:normal    ESOPHAGEAL BRUSHING  08/24/2021   Procedure: ESOPHAGEAL BRUSHING;  Surgeon: Eloise Harman, DO;  Location: [facility];  Service: Endoscopy;;   ESOPHAGOGASTRODUODENOSCOPY (EGD) WITH PROPOFOL N/A 03/18/2020   Procedure: ESOPHAGOGASTRODUODENOSCOPY (EGD) WITH PROPOFOL;  Surgeon: Eloise Harman, DO;  Location: [facility];  Service: Endoscopy;  Laterality: N/A;  2:30pm   ESOPHAGOGASTRODUODENOSCOPY (EGD) WITH PROPOFOL N/A 08/24/2021   Procedure: ESOPHAGOGASTRODUODENOSCOPY (EGD) WITH PROPOFOL;  Surgeon: Hurshel Keys  K, DO;  Location: [facility];  Service: Endoscopy;  Laterality: N/A;  with dilation   FLEXIBLE SIGMOIDOSCOPY N/A 08/24/2021   Procedure: FLEXIBLE SIGMOIDOSCOPY;  Surgeon: Eloise Harman, DO;  [facility]: [facility];  Service: Endoscopy;  Laterality: N/A;   NASAL SINUS SURGERY     NEPHRECTOMY     RIGHT (Question CA.  No further therapy needed.)   RIGHT/LEFT HEART CATH AND CORONARY ANGIOGRAPHY N/A 09/03/2019   Procedure: RIGHT/LEFT HEART CATH AND CORONARY ANGIOGRAPHY;  Surgeon: Lorretta Harp, MD;  Location: [facility];  Service: Cardiovascular;  Laterality: N/A;   Family History Family History  Problem Relation Age of Onset   Stroke Mother    Breast cancer Mother    Lung cancer Brother    Colon cancer Neg Hx     Social History Social History   Tobacco Use   Smoking status: Former    Packs/day: 1.00    Years: 15.00    Total pack years: 15.00    Types: Cigarettes    Start date: 07/14/1957    Quit date: 08/09/1976    Years since quitting: 45.8   Smokeless tobacco: Former    Types: Chew    Quit date: 02/10/2006   Tobacco comments:    chewed tobacco for 10 years  Vaping Use   Vaping Use: Never used  Substance Use Topics   Alcohol use: Not Currently    Alcohol/week: 0.0 standard drinks of alcohol    Comment: no etoh since he was in his 95s   Drug use: Never   Allergies Patient has no known allergies.  Review of Systems Review of Systems  All  other systems reviewed and are negative.   Physical Exam Vital Signs  I have reviewed the triage vital signs BP (!) 152/76   Pulse 93   Temp 98.1 F (36.7 C) (Oral)   Resp (!) 33   SpO2 99%  Physical Exam Vitals and nursing note reviewed.  Constitutional:      General: He is not in acute distress.    Appearance: Normal appearance.  HENT:     Mouth/Throat:     Mouth: Mucous membranes are moist.  Eyes:     Conjunctiva/sclera: Conjunctivae normal.  Cardiovascular:     Rate and Rhythm: Normal rate and regular rhythm.  Pulmonary:     Comments: Mild increased work of breathing, diffuse wheezing Abdominal:     General: Abdomen is flat.     Palpations: Abdomen is soft.     Tenderness: There is no abdominal tenderness.  Genitourinary:    Comments: Chaperoned by RN, left inguinal fullness/hernia which is easily reducible Musculoskeletal:     Right lower leg: No edema.     Left lower leg: No edema.  Skin:    General: Skin is warm and dry.     Capillary Refill: Capillary refill takes less than 2 seconds.  Neurological:     Mental Status: He is alert and oriented to person, place, and time. Mental status is at baseline.  Psychiatric:        Mood and Affect: Mood normal.        Behavior: Behavior normal.     ED Results and Treatments Labs (all labs ordered are listed, but only abnormal results are displayed) Labs Reviewed  BASIC METABOLIC PANEL - Abnormal; Notable for the following components:      Result Value   Glucose, Bld 102 (*)    Calcium 8.6 (*)  All other components within normal limits  CBC - Abnormal; Notable for the following components:   RBC 2.93 (*)    Hemoglobin 8.2 (*)    HCT 28.3 (*)    MCHC 29.0 (*)    All other components within normal limits  D-DIMER, QUANTITATIVE - Abnormal; Notable for the following components:   D-Dimer, Quant 1.83 (*)    All other components within normal limits  BLOOD GAS, VENOUS - Abnormal; Notable for the following  components:   pH, Ven 7.49 (*)    pCO2, Ven 38 (*)    pO2, Ven 54 (*)    Bicarbonate 29.0 (*)    Acid-Base Excess 5.4 (*)    All other components within normal limits  TROPONIN I (HIGH SENSITIVITY) - Abnormal; Notable for the following components:   Troponin I (High Sensitivity) 26 (*)    All other components within normal limits  TROPONIN I (HIGH SENSITIVITY) - Abnormal; Notable for the following components:   Troponin I (High Sensitivity) 26 (*)    All other components within normal limits  RESP PANEL BY RT-PCR (FLU A&B, COVID) ARPGX2  URINALYSIS, ROUTINE W REFLEX MICROSCOPIC  BRAIN NATRIURETIC PEPTIDE                                                                                                                          Radiology CT Abdomen Pelvis W Contrast  Result Date: 05/28/2022 CLINICAL DATA:  Abdominal pain. EXAM: CT ABDOMEN AND PELVIS WITH CONTRAST TECHNIQUE: Multidetector CT imaging of the abdomen and pelvis was performed using the standard protocol following bolus administration of intravenous contrast. RADIATION DOSE REDUCTION: This exam was performed according to the departmental dose-optimization program which includes automated exposure control, adjustment of the mA and/or kV according to patient size and/or use of iterative reconstruction technique. CONTRAST:  155m OMNIPAQUE IOHEXOL 300 MG/ML  SOLN COMPARISON:  CT dated 02/27/2022. FINDINGS: Lower chest: Chronic bibasilar interstitial coarsening, reticulation and scarring with associated bronchiectasis. There is coronary vascular calcification. No intra-abdominal free air or free fluid. Hepatobiliary: The liver is unremarkable. There is mild biliary dilatation, likely post cholecystectomy. No calcified stone noted in the central CBD. Pancreas: Unremarkable. No pancreatic ductal dilatation or surrounding inflammatory changes. Spleen: Normal in size without focal abnormality. Adrenals/Urinary Tract: The adrenal glands  unremarkable. Status post prior right nephrectomy. Subcentimeter left renal inferior pole hypodense lesion is suboptimally characterized. There is no hydronephrosis. The visualized left ureter appears unremarkable. There is herniation of a part of the urinary bladder into the left inguinal canal. There is high-grade narrowing of the cyst herniated segment of the bladder at the neck of the hernia. There is mild adjacent inflammatory changes and small fluid which may represent an inflammatory/infectious process. Stomach/Bowel: Evaluation of the bowel is limited in the absence of oral contrast 5. There is scattered colonic diverticula without active inflammatory changes. Mild diffuse thickened appearance of the colon, likely related to underdistention. Colitis is less likely fifth clinical correlation recommended.  There is no bowel obstruction. The appendix is not visualized with certainty. No inflammatory changes identified in the right lower quadrant. Vascular/Lymphatic: Moderate aortoiliac atherosclerotic disease. The IVC is unremarkable. No portal venous gas. There is no adenopathy. Reproductive: Mildly enlarged prostate gland with median lobe hypertrophy. Other: Mild diffuse subcutaneous edema. Musculoskeletal: Degenerative changes of the spine and osteopenia. No acute osseous pathology. IMPRESSION: 1. Partial herniation of the urinary bladder into the left inguinal canal with high-grade narrowing at the neck of the hernia and probable superimposed inflammation/infection. 2. Colonic diverticulosis. No bowel obstruction. 3. Status post prior right nephrectomy. 4. Chronic bibasilar interstitial changes. 5.  Aortic Atherosclerosis (ICD10-I70.0). Electronically Signed   By: Anner Crete M.D.   On: 05/28/2022 22:27   DG Chest 2 View  Result Date: 05/28/2022 CLINICAL DATA:  Shortness of breath EXAM: CHEST - 2 VIEW COMPARISON:  04/14/2022, 02/27/2022 FINDINGS: Mild cardiomegaly, stable. Aortic atherosclerosis.  Chronic retrocardiac opacity, similar to multiple previous studies. Minimal right basilar atelectasis. No pleural effusion or pneumothorax. IMPRESSION: Chronic left lower lobe scarring/atelectasis, similar to multiple previous studies. Minimal right basilar atelectasis. Electronically Signed   By: Davina Poke D.O.   On: 05/28/2022 17:56    Pertinent labs & imaging results that were available during my care of the patient were reviewed by me and considered in my medical decision making (see MDM for details).  Medications Ordered in ED Medications  iohexol (OMNIPAQUE) 350 MG/ML injection 75 mL (has no administration in time range)  methylPREDNISolone sodium succinate (SOLU-MEDROL) 125 mg/2 mL injection 125 mg (125 mg Intravenous Given 05/28/22 1950)  ipratropium-albuterol (DUONEB) 0.5-2.5 (3) MG/3ML nebulizer solution 3 mL (3 mLs Nebulization Given 05/28/22 2022)  albuterol (PROVENTIL) (2.5 MG/3ML) 0.083% nebulizer solution (2.5 mg  Given 05/28/22 2022)  iohexol (OMNIPAQUE) 300 MG/ML solution 100 mL (100 mLs Intravenous Contrast Given 05/28/22 2200)  ipratropium-albuterol (DUONEB) 0.5-2.5 (3) MG/3ML nebulizer solution 3 mL (3 mLs Nebulization Given 05/28/22 2321)                                                                                                                                     Procedures Procedures  (including critical care time)  Medical Decision Making / ED Course   MDM:  81 year old male presenting to the emergency department with shortness of breath, chest pain.  Suspect most likely cause of symptoms of COPD exacerbation.  He received breathing treatments and steroids in the emergency department with improvement in his symptoms.  He is not hypoxic.  His wheezing improved with treatment.  Venous blood gas obtained after breathing treatment is reassuring.   Doubt ACS, EKG appears similar to previous, no acute ST or T wave changes.  Troponin mildly elevated but stable,  possibly in the setting of CHF and chronic kidney disease.  Patient does have history of pulmonary embolism, not currently on anticoagulation, so D-dimer obtained which is elevated, will obtain CT angiography of  the chest to further evaluate.  Chest x-ray with chronic changes but no findings concerning for acute pneumonia or pneumothorax.  CT of the abdomen demonstrates left inguinal hernia, which is very soft on exam and easily reducible.  No evidence of urinary infection, bowel obstruction related to hernia.  Advise outpatient follow-up with general surgeon.  Clinical Course as of 05/29/22 0019  Sat May 29, 2022  0012 Signed out pending CTA chest.  [WS]    Clinical Course User Index [WS] Cristie Hem, MD     Additional history obtained: -Additional history obtained from family -External records from outside source obtained and reviewed including: Chart review including previous notes, labs, imaging, consultation notes including prior admission 04/14/22   Lab Tests: -I ordered, reviewed, and interpreted labs.   The pertinent results include:   Labs Reviewed  BASIC METABOLIC PANEL - Abnormal; Notable for the following components:      Result Value   Glucose, Bld 102 (*)    Calcium 8.6 (*)    All other components within normal limits  CBC - Abnormal; Notable for the following components:   RBC 2.93 (*)    Hemoglobin 8.2 (*)    HCT 28.3 (*)    MCHC 29.0 (*)    All other components within normal limits  D-DIMER, QUANTITATIVE - Abnormal; Notable for the following components:   D-Dimer, Quant 1.83 (*)    All other components within normal limits  BLOOD GAS, VENOUS - Abnormal; Notable for the following components:   pH, Ven 7.49 (*)    pCO2, Ven 38 (*)    pO2, Ven 54 (*)    Bicarbonate 29.0 (*)    Acid-Base Excess 5.4 (*)    All other components within normal limits  TROPONIN I (HIGH SENSITIVITY) - Abnormal; Notable for the following components:   Troponin I (High  Sensitivity) 26 (*)    All other components within normal limits  TROPONIN I (HIGH SENSITIVITY) - Abnormal; Notable for the following components:   Troponin I (High Sensitivity) 26 (*)    All other components within normal limits  RESP PANEL BY RT-PCR (FLU A&B, COVID) ARPGX2  URINALYSIS, ROUTINE W REFLEX MICROSCOPIC  BRAIN NATRIURETIC PEPTIDE    Notable for mild elevated troponin/stable      Imaging Studies ordered: I ordered imaging studies including CT a/p On my interpretation imaging demonstrates left inguinal hernia, no obstruction I independently visualized and interpreted imaging. I agree with the radiologist interpretation   Medicines ordered and prescription drug management: Meds ordered this encounter  Medications   methylPREDNISolone sodium succinate (SOLU-MEDROL) 125 mg/2 mL injection 125 mg   ipratropium-albuterol (DUONEB) 0.5-2.5 (3) MG/3ML nebulizer solution 3 mL   albuterol (PROVENTIL) (2.5 MG/3ML) 0.083% nebulizer solution    Hali Marry F: cabinet override   iohexol (OMNIPAQUE) 300 MG/ML solution 100 mL   predniSONE (DELTASONE) 50 MG tablet    Sig: Take 1 tablet (50 mg total) by mouth daily for 4 days.    Dispense:  4 tablet    Refill:  0   amoxicillin-clavulanate (AUGMENTIN) 875-125 MG tablet    Sig: Take 1 tablet by mouth every 12 (twelve) hours.    Dispense:  14 tablet    Refill:  0   doxycycline (VIBRAMYCIN) 100 MG capsule    Sig: Take 1 capsule (100 mg total) by mouth 2 (two) times daily.    Dispense:  20 capsule    Refill:  0   ipratropium-albuterol (DUONEB) 0.5-2.5 (3) MG/3ML nebulizer  solution 3 mL   iohexol (OMNIPAQUE) 350 MG/ML injection 75 mL    -I have reviewed the patients home medicines and have made adjustments as needed   Cardiac Monitoring: The patient was maintained on a cardiac monitor.  I personally viewed and interpreted the cardiac monitored which showed an underlying rhythm of: NSR  Social Determinants of Health:   Diagnosis or treatment significantly limited by social determinants of health: former smoker   Reevaluation: After the interventions noted above, I reevaluated the patient and found that they have improved  Co morbidities that complicate the patient evaluation  Past Medical History:  Diagnosis Date   Asthma    Chronic renal insufficiency    COPD (chronic obstructive pulmonary disease) ([place])    Heart failure ([place])    a. EF 25-30% in 08/2019 with cath showing normal cors b. EF 20-25% in 09/2020 c. 30-35% in 02/2021 d. EF 20-25% in 10/2021   Hypertension    Pulmonary emboli ([place]) 03/2019   Renal insufficiency    Type 2 diabetes mellitus ([place])       Dispostion: Disposition decision including need for hospitalization was considered. Disposition decision pending at sign out.     Final Clinical Impression(s) / ED Diagnoses Final diagnoses:  COPD exacerbation ([place])  Left inguinal hernia     This chart was dictated using voice recognition software.  Despite best efforts to proofread,  errors can occur which can change the documentation meaning.    Cristie Hem, MD 05/29/22 [id number]

## 2022-05-28 NOTE — ED Provider Triage Note (Signed)
Emergency Medicine Provider Triage Evaluation Note  Gregory Mitchell , a 81 y.o. male  was evaluated in triage.  Pt complains of chest pain. States he has had chest pain for 3-4 days. States that it is central and non-radiating. Has had associated shortness of breath and increased cough. Wears 3L at home and has not had to increase this. Denies fevers, abd pain, n/v/d.[name]  Review of Systems  Positive: See above Negative:  Physical Exam  BP (!) 148/82 (BP Location: Right Arm)   Pulse (!) 102   Temp 99 F (37.2 C) (Oral)   Resp 18   SpO2 97%  Gen:   Awake, no distress   Resp:  Normal effort  MSK:   Moves extremities without difficulty Other:  3L [place], S1/S2 without murmur  Medical Decision Making  Medically screening exam initiated at 6:41 PM.  Appropriate orders placed.  Gregory Mitchell was informed that the remainder of the evaluation will be completed by another provider, this initial triage assessment does not replace that evaluation, and the importance of remaining in the ED until their evaluation is complete.     Mickie Hillier, PA-C 05/28/22 1842

## 2022-05-28 NOTE — ED Triage Notes (Signed)
Pt reports chest pain and SHOB that started at 1500 yesterday.

## 2022-05-28 NOTE — ED Notes (Signed)
Patient wears 3 liters at home oxygen

## 2022-05-28 NOTE — ED Notes (Addendum)
Pt stated that pt could not breath. Made nurse aware. Put pt on 2L nasal cannula.

## 2022-05-28 NOTE — Discharge Instructions (Signed)
You were evaluated today for shortness of breath and chest pain.  You likely have a COPD exacerbation.  Your CT scan is clean.  Continue medications as prescribed.

## 2022-05-29 ENCOUNTER — Emergency Department (HOSPITAL_COMMUNITY): Payer: Medicare HMO

## 2022-05-29 LAB — BRAIN NATRIURETIC PEPTIDE: B Natriuretic Peptide: 746 pg/mL — ABNORMAL HIGH (ref 0.0–100.0)

## 2022-05-29 MED ORDER — IOHEXOL 350 MG/ML SOLN
75.0000 mL | Freq: Once | INTRAVENOUS | Status: AC | PRN
  Administered 2022-05-29: 75 mL via INTRAVENOUS

## 2022-05-29 NOTE — ED Provider Notes (Signed)
Patient signed out pending CT PE study.  In brief, patient presented with shortness of breath and chest discomfort.  Presumed COPD exacerbation.  Improved with nebulization.  Ultimately also had some abdominal pain.  CT showed a hernia but no obstructive pattern.  CT PE study was obtained given positive D-dimer and history of PE.  CT scan does not show any evidence of PE.  Does show some chronic changes.  On recheck, patient is resting comfortably on his home oxygen.  Troponin is stable at baseline.  Will be discharged with medications for presumed COPD exacerbation Physical Exam  BP (!) 152/76   Pulse 93   Temp 98.1 F (36.7 C) (Oral)   Resp (!) 33   SpO2 99%   Physical Exam Resting comfortably on home O2 Procedures  Procedures  ED Course / MDM   Clinical Course as of 05/29/22 0130  Sat May 29, 2022  0012 Signed out pending CTA chest.  [WS]    Clinical Course User Index [WS] Cristie Hem, MD   Medical Decision Making Amount and/or Complexity of Data Reviewed Labs: ordered. Radiology: ordered.  Risk Prescription drug management.   Problem List Items Addressed This Visit   None Visit Diagnoses     COPD exacerbation ([place])    -  Primary   Relevant Medications   methylPREDNISolone sodium succinate (SOLU-MEDROL) 125 mg/2 mL injection 125 mg (Completed)   ipratropium-albuterol (DUONEB) 0.5-2.5 (3) MG/3ML nebulizer solution 3 mL (Completed)   albuterol (PROVENTIL) (2.5 MG/3ML) 0.083% nebulizer solution (Completed)   predniSONE (DELTASONE) 50 MG tablet   ipratropium-albuterol (DUONEB) 0.5-2.5 (3) MG/3ML nebulizer solution 3 mL (Completed)   Left inguinal hernia                 Merryl Hacker, MD 05/29/22 0131

## 2022-06-02 ENCOUNTER — Encounter: Payer: Self-pay | Admitting: Internal Medicine

## 2022-06-02 ENCOUNTER — Ambulatory Visit (INDEPENDENT_AMBULATORY_CARE_PROVIDER_SITE_OTHER): Payer: Medicare HMO | Admitting: Internal Medicine

## 2022-06-02 VITALS — BP 99/58 | HR 94 | Temp 98.7°F | Ht 66.0 in | Wt 144.7 lb

## 2022-06-02 DIAGNOSIS — K219 Gastro-esophageal reflux disease without esophagitis: Secondary | ICD-10-CM | POA: Diagnosis not present

## 2022-06-02 DIAGNOSIS — B3781 Candidal esophagitis: Secondary | ICD-10-CM

## 2022-06-02 DIAGNOSIS — K5900 Constipation, unspecified: Secondary | ICD-10-CM

## 2022-06-02 DIAGNOSIS — R1319 Other dysphagia: Secondary | ICD-10-CM

## 2022-06-02 NOTE — Patient Instructions (Signed)
I will reach out to Dr. Sherrie Sport in regards to stopping your mirtazapine.  Once you have come off this medication, I will send in another course of Diflucan for presumed yeast infection of the esophagus.  We will call you after I have spoken with him.  It was very nice seeing both you again today.  Dr. Abbey Chatters

## 2022-06-02 NOTE — Progress Notes (Signed)
Referring Provider: Tacy Learn, FNP Primary Care Physician:  Tacy Learn, FNP Primary GI:  Dr. Marletta Lor  Chief Complaint  Patient presents with   Abdominal Pain    Patient here today states after eating he gets a chest tightness. He states he has not been taking any medication when this happens. He does have occasional constipation and he takes miralax, and sennakot daily. He is still having issues with swallowing. He had a swallowing test done a month ago per daughter.He gets a acid taste in his mouth most days and states he is taking pantoprzole 40 mg once per day which has not been of any help.Went to Ed at [facility] 05/28/2022 due to Copd.    HPI:   Gregory Mitchell is a 81 y.o. male who presents to the clinic today for follow-up.  Complicated past medical history including heart failure with reduced ejection fraction, COPD, chronic respiratory failure, chronic dysphagia, recurrent candidal esophagitis, chronic GERD, constipation, iron deficiency anemia, who presents to clinic today for follow-up visit.  On previous visit, wish to undergo EGD and colonoscopy to further evaluate dysphagia and iron deficiency anemia.  Was awaiting clearance from pulmonology.  Did see pulmonology 04/23/2022 as well as 05/07/2022.  Continues to have issues with dysphagia with meals.  Coughing.  Some epigastric chest discomfort with eating.  No melena hematochezia.  Been treated with multiple rounds of Diflucan, symptoms improved and then returned.  Last EGD:08/24/21- Moderately severe candidiasis esophagitis with no bleeding. Cells for cytology obtained. - Gastritis. Biopsied. - Normal duodenal bulb, first portion of the duodenum and second portion of the duodenum.   Flex sig: 08/24/21- Sacral decubitus ulcer found on perianal exam. - The recto-sigmoid colon, sigmoid colon and descending colon are normal. - No specimens collected.   Colonoscopy March 2008: normal,  no specimens  Past Medical  History:  Diagnosis Date   Asthma    Chronic renal insufficiency    COPD (chronic obstructive pulmonary disease) (HCC)    Heart failure (HCC)    a. EF 25-30% in 08/2019 with cath showing normal cors b. EF 20-25% in 09/2020 c. 30-35% in 02/2021 d. EF 20-25% in 10/2021   Hypertension    Pulmonary emboli (HCC) 03/2019   Renal insufficiency    Type 2 diabetes mellitus (HCC)     Past Surgical History:  Procedure Laterality Date   APPENDECTOMY     BALLOON DILATION N/A 03/18/2020   Procedure: BALLOON DILATION;  Surgeon: Lanelle Bal, DO;  Location: [facility];  Service: Endoscopy;  Laterality: N/A;   CHOLECYSTECTOMY     COLONOSCOPY  10/2006   Dr.Anwar:normal   ESOPHAGEAL BRUSHING  08/24/2021   Procedure: ESOPHAGEAL BRUSHING;  Surgeon: Lanelle Bal, DO;  Location: [facility];  Service: Endoscopy;;   ESOPHAGOGASTRODUODENOSCOPY (EGD) WITH PROPOFOL N/A 03/18/2020   Procedure: ESOPHAGOGASTRODUODENOSCOPY (EGD) WITH PROPOFOL;  Surgeon: Lanelle Bal, DO;  Location: [facility];  Service: Endoscopy;  Laterality: N/A;  2:30pm   ESOPHAGOGASTRODUODENOSCOPY (EGD) WITH PROPOFOL N/A 08/24/2021   Procedure: ESOPHAGOGASTRODUODENOSCOPY (EGD) WITH PROPOFOL;  Surgeon: Lanelle Bal, DO;  Location: [facility];  Service: Endoscopy;  Laterality: N/A;  with dilation   FLEXIBLE SIGMOIDOSCOPY N/A 08/24/2021   Procedure: FLEXIBLE SIGMOIDOSCOPY;  Surgeon: Lanelle Bal, DO;  Location: [facility];  Service: Endoscopy;  Laterality: N/A;   NASAL SINUS SURGERY     NEPHRECTOMY     RIGHT (Question CA.  No further therapy needed.)   RIGHT/LEFT HEART  CATH AND CORONARY ANGIOGRAPHY N/A 09/03/2019   Procedure: RIGHT/LEFT HEART CATH AND CORONARY ANGIOGRAPHY;  Surgeon: Runell Gess, MD;  Location: [facility];  Service: Cardiovascular;  Laterality: N/A;    Current Outpatient Medications  Medication Sig Dispense Refill   albuterol (PROVENTIL) (2.5 MG/3ML) 0.083% nebulizer solution  Take 3 mLs (2.5 mg total) by nebulization every 4 (four) hours as needed for wheezing or shortness of breath. 75 mL 2   amoxicillin-clavulanate (AUGMENTIN) 875-125 MG tablet Take 1 tablet by mouth every 12 (twelve) hours. 14 tablet 0   atorvastatin (LIPITOR) 80 MG tablet Take 80 mg by mouth every evening.     budesonide (PULMICORT) 0.25 MG/2ML nebulizer solution Take 2 mLs (0.25 mg total) by nebulization 2 (two) times daily. 60 mL 12   dapagliflozin propanediol (FARXIGA) 10 MG TABS tablet Take 1 tablet (10 mg total) by mouth daily before breakfast. 100 tablet 3   doxycycline (VIBRAMYCIN) 100 MG capsule Take 1 capsule (100 mg total) by mouth 2 (two) times daily. 20 capsule 0   guaiFENesin (MUCINEX) 600 MG 12 hr tablet Take 1 tablet (600 mg total) by mouth 2 (two) times daily. 60 tablet 2   HM SENNA-S 8.6-50 MG tablet Take 2 tablets by mouth daily.     HYDROcodone-acetaminophen (NORCO) 7.5-325 MG tablet Take 1 tablet by mouth 2 (two) times daily as needed for moderate pain.     metoprolol succinate (TOPROL XL) 25 MG 24 hr tablet Take 1 tablet (25 mg total) by mouth daily. 90 tablet 3   mirtazapine (REMERON) 15 MG tablet Take 15 mg by mouth daily.     OXYGEN Inhale 4 L into the lungs daily as needed (for shortness of breath).     pantoprazole (PROTONIX) 40 MG tablet Take 1 tablet (40 mg total) by mouth daily. 60 tablet 3   tamsulosin (FLOMAX) 0.4 MG CAPS capsule Take 0.8 mg by mouth 2 (two) times daily.     torsemide (DEMADEX) 20 MG tablet Take 1 tablet (20 mg total) by mouth as needed (swelling).     lactulose (CHRONULAC) 10 GM/15ML solution Take 30 mLs (20 g total) by mouth 3 (three) times daily as needed for mild constipation. (Patient not taking: Reported on 06/02/2022) 1800 mL 3   No current facility-administered medications for this visit.    Allergies as of 06/02/2022   (No Known Allergies)    Family History  Problem Relation Age of Onset   Stroke Mother    Breast cancer Mother     Lung cancer Brother    Colon cancer Neg Hx     Social History   Socioeconomic History   Marital status: Widowed    Spouse name: Not on file   Number of children: Not on file   Years of education: Not on file   Highest education level: Not on file  Occupational History   Not on file  Tobacco Use   Smoking status: Former    Packs/day: 1.00    Years: 15.00    Total pack years: 15.00    Types: Cigarettes    Start date: 07/14/1957    Quit date: 08/09/1976    Years since quitting: 45.8   Smokeless tobacco: Former    Types: Chew    Quit date: 02/10/2006   Tobacco comments:    chewed tobacco for 10 years  Vaping Use   Vaping Use: Never used  Substance and Sexual Activity   Alcohol use: Not Currently  Alcohol/week: 0.0 standard drinks of alcohol    Comment: no etoh since he was in his 55s   Drug use: Never   Sexual activity: Yes  Other Topics Concern   Not on file  Social History Narrative   Lives with wife.  Three children.     Social Determinants of Health   Financial Resource Strain: Not on file  Food Insecurity: No Food Insecurity (04/15/2022)   Hunger Vital Sign    Worried About Running Out of Food in the Last Year: Never true    Ran Out of Food in the Last Year: Never true  Transportation Needs: Not on file  Physical Activity: Not on file  Stress: Not on file  Social Connections: Not on file    Subjective: Review of Systems  Constitutional:  Negative for chills and fever.  HENT:  Negative for congestion and hearing loss.   Eyes:  Negative for blurred vision and double vision.  Respiratory:  Negative for cough and shortness of breath.   Cardiovascular:  Negative for chest pain and palpitations.  Gastrointestinal:  Positive for heartburn and nausea. Negative for abdominal pain, blood in stool, constipation, diarrhea, melena and vomiting.       Dysphagia  Genitourinary:  Negative for dysuria and urgency.  Musculoskeletal:  Negative for joint pain and myalgias.   Skin:  Negative for itching and rash.  Neurological:  Negative for dizziness and headaches.  Psychiatric/Behavioral:  Negative for depression. The patient is not nervous/anxious.      Objective: BP (!) 99/58 (BP Location: Right Arm, Patient Position: Sitting, Cuff Size: Small)   Pulse 94   Temp 98.7 F (37.1 C) (Oral)   Ht 5\' 6"  (1.676 m)   Wt 144 lb 11.2 oz (65.6 kg)   BMI 23.36 kg/m  Physical Exam Constitutional:      Appearance: Normal appearance.  HENT:     Head: Normocephalic and atraumatic.  Eyes:     Extraocular Movements: Extraocular movements intact.     Conjunctiva/sclera: Conjunctivae normal.  Cardiovascular:     Rate and Rhythm: Normal rate and regular rhythm.  Pulmonary:     Effort: Pulmonary effort is normal.     Breath sounds: Rales present.  Abdominal:     General: Bowel sounds are normal.     Palpations: Abdomen is soft.  Musculoskeletal:        General: Normal range of motion.     Cervical back: Normal range of motion and neck supple.  Skin:    General: Skin is warm.  Neurological:     General: No focal deficit present.     Mental Status: He is alert and oriented to person, place, and time.  Psychiatric:        Mood and Affect: Mood normal.        Behavior: Behavior normal.      Assessment: *Dysphagia-chronic *Recurrent candidal esophagitis *GERD *Constipation *Iron deficiency anemia  Plan: Patient has been a difficult to treat from a dysphagia standpoint.  History of recurrent candidal esophagitis which always improves on Diflucan but then his symptoms returned.  Has had multiple courses of antibiotics and steroids.  Likely this infection has either not being completely resolved or continues to recur.  His symptoms are consistent with prior.  We will put on a 3-week course of Diflucan.  Discussed with patient's PCP given his QT prolongation.  Okay per Dr. Olena Leatherwood to hold Remeron for now during treatment.  Given his multiple comorbidities  including CHF, chronic  respiratory failure, COPD, patient is high risk for further procedures including EGD and colonoscopy.  We will hold off for now though pending clinical course may need to proceed with these with the approval from his pulmonologist.  Follow-up in 4 to 6 weeks.  06/03/2022 1:23 PM   Disclaimer: This note was dictated with voice recognition software. Similar sounding words can inadvertently be transcribed and may not be corrected upon review.

## 2022-06-03 ENCOUNTER — Other Ambulatory Visit: Payer: Self-pay | Admitting: Internal Medicine

## 2022-06-03 MED ORDER — FLUCONAZOLE 200 MG PO TABS: ORAL_TABLET | ORAL | 0 refills | Status: DC

## 2022-07-14 ENCOUNTER — Encounter: Payer: Self-pay | Admitting: Internal Medicine

## 2022-07-14 ENCOUNTER — Observation Stay (HOSPITAL_COMMUNITY)
Admission: EM | Admit: 2022-07-14 | Discharge: 2022-07-17 | Discharge status: Against Medical Advice | Payer: Medicare HMO | Attending: Family Medicine | Admitting: Family Medicine

## 2022-07-14 ENCOUNTER — Encounter (HOSPITAL_COMMUNITY): Payer: Self-pay | Admitting: *Deleted

## 2022-07-14 ENCOUNTER — Other Ambulatory Visit: Payer: Self-pay

## 2022-07-14 ENCOUNTER — Emergency Department (HOSPITAL_COMMUNITY): Payer: Medicare HMO

## 2022-07-14 ENCOUNTER — Ambulatory Visit (INDEPENDENT_AMBULATORY_CARE_PROVIDER_SITE_OTHER): Payer: Medicare HMO | Admitting: Internal Medicine

## 2022-07-14 VITALS — BP 136/90 | HR 110 | Temp 98.2°F | Ht 66.0 in | Wt 134.2 lb

## 2022-07-14 DIAGNOSIS — J9611 Chronic respiratory failure with hypoxia: Secondary | ICD-10-CM | POA: Diagnosis not present

## 2022-07-14 DIAGNOSIS — R079 Chest pain, unspecified: Secondary | ICD-10-CM | POA: Diagnosis present

## 2022-07-14 DIAGNOSIS — Z79899 Other long term (current) drug therapy: Secondary | ICD-10-CM | POA: Diagnosis not present

## 2022-07-14 DIAGNOSIS — R7989 Other specified abnormal findings of blood chemistry: Secondary | ICD-10-CM | POA: Diagnosis not present

## 2022-07-14 DIAGNOSIS — R1013 Epigastric pain: Secondary | ICD-10-CM | POA: Diagnosis not present

## 2022-07-14 DIAGNOSIS — K921 Melena: Secondary | ICD-10-CM | POA: Insufficient documentation

## 2022-07-14 DIAGNOSIS — R770 Abnormality of albumin: Secondary | ICD-10-CM | POA: Insufficient documentation

## 2022-07-14 DIAGNOSIS — J449 Chronic obstructive pulmonary disease, unspecified: Secondary | ICD-10-CM | POA: Insufficient documentation

## 2022-07-14 DIAGNOSIS — D649 Anemia, unspecified: Secondary | ICD-10-CM | POA: Diagnosis not present

## 2022-07-14 DIAGNOSIS — R058 Other specified cough: Secondary | ICD-10-CM | POA: Diagnosis not present

## 2022-07-14 DIAGNOSIS — R1319 Other dysphagia: Secondary | ICD-10-CM | POA: Diagnosis not present

## 2022-07-14 DIAGNOSIS — Z87891 Personal history of nicotine dependence: Secondary | ICD-10-CM | POA: Diagnosis not present

## 2022-07-14 DIAGNOSIS — I5042 Chronic combined systolic (congestive) and diastolic (congestive) heart failure: Secondary | ICD-10-CM | POA: Diagnosis not present

## 2022-07-14 DIAGNOSIS — K219 Gastro-esophageal reflux disease without esophagitis: Secondary | ICD-10-CM | POA: Diagnosis present

## 2022-07-14 DIAGNOSIS — J45909 Unspecified asthma, uncomplicated: Secondary | ICD-10-CM | POA: Diagnosis not present

## 2022-07-14 DIAGNOSIS — I110 Hypertensive heart disease with heart failure: Secondary | ICD-10-CM | POA: Insufficient documentation

## 2022-07-14 DIAGNOSIS — R0789 Other chest pain: Secondary | ICD-10-CM | POA: Diagnosis not present

## 2022-07-14 DIAGNOSIS — D500 Iron deficiency anemia secondary to blood loss (chronic): Secondary | ICD-10-CM | POA: Insufficient documentation

## 2022-07-14 DIAGNOSIS — Z86711 Personal history of pulmonary embolism: Secondary | ICD-10-CM | POA: Insufficient documentation

## 2022-07-14 DIAGNOSIS — R1033 Periumbilical pain: Secondary | ICD-10-CM | POA: Diagnosis present

## 2022-07-14 DIAGNOSIS — R1310 Dysphagia, unspecified: Secondary | ICD-10-CM | POA: Insufficient documentation

## 2022-07-14 DIAGNOSIS — I10 Essential (primary) hypertension: Secondary | ICD-10-CM | POA: Diagnosis present

## 2022-07-14 DIAGNOSIS — E8809 Other disorders of plasma-protein metabolism, not elsewhere classified: Secondary | ICD-10-CM | POA: Diagnosis present

## 2022-07-14 DIAGNOSIS — K5900 Constipation, unspecified: Secondary | ICD-10-CM | POA: Insufficient documentation

## 2022-07-14 DIAGNOSIS — T402X5A Adverse effect of other opioids, initial encounter: Secondary | ICD-10-CM | POA: Diagnosis present

## 2022-07-14 LAB — CBC
HCT: 26.3 % — ABNORMAL LOW (ref 39.0–52.0)
HCT: 27.2 % — ABNORMAL LOW (ref 39.0–52.0)
Hemoglobin: 7.3 g/dL — ABNORMAL LOW (ref 13.0–17.0)
Hemoglobin: 8 g/dL — ABNORMAL LOW (ref 13.0–17.0)
MCH: 26.3 pg (ref 26.0–34.0)
MCH: 27.2 pg (ref 26.0–34.0)
MCHC: 27.8 g/dL — ABNORMAL LOW (ref 30.0–36.0)
MCHC: 29.4 g/dL — ABNORMAL LOW (ref 30.0–36.0)
MCV: 94.6 fL (ref 80.0–100.0)
MCV: 92.5 fL (ref 80.0–100.0)
Platelets: 208 10*3/uL (ref 150–400)
Platelets: 223 10*3/uL (ref 150–400)
RBC: 2.78 MIL/uL — ABNORMAL LOW (ref 4.22–5.81)
RBC: 2.94 MIL/uL — ABNORMAL LOW (ref 4.22–5.81)
RDW: 14.9 % (ref 11.5–15.5)
RDW: 15.1 % (ref 11.5–15.5)
WBC: 4.2 10*3/uL (ref 4.0–10.5)
WBC: 4.7 10*3/uL (ref 4.0–10.5)
nRBC: 0 % (ref 0.0–0.2)
nRBC: 0 % (ref 0.0–0.2)

## 2022-07-14 LAB — TROPONIN I (HIGH SENSITIVITY)
Troponin I (High Sensitivity): 22 ng/L — ABNORMAL HIGH (ref <18)
Troponin I (High Sensitivity): 23 ng/L — ABNORMAL HIGH (ref <18)
Troponin I (High Sensitivity): 24 ng/L — ABNORMAL HIGH (ref <18)

## 2022-07-14 LAB — COMPREHENSIVE METABOLIC PANEL
ALT: 9 U/L (ref 0–44)
AST: 17 U/L (ref 15–41)
Albumin: 2.7 g/dL — ABNORMAL LOW (ref 3.5–5.0)
Alkaline Phosphatase: 59 U/L (ref 38–126)
Anion gap: 7 (ref 5–15)
BUN: 12 mg/dL (ref 8–23)
CO2: 26 mmol/L (ref 22–32)
Calcium: 8.2 mg/dL — ABNORMAL LOW (ref 8.9–10.3)
Chloride: 107 mmol/L (ref 98–111)
Creatinine, Ser: 1.17 mg/dL (ref 0.61–1.24)
GFR, Estimated: >60 mL/min (ref >60)
Glucose, Bld: 104 mg/dL — ABNORMAL HIGH (ref 70–99)
Potassium: 4.9 mmol/L (ref 3.5–5.1)
Sodium: 140 mmol/L (ref 135–145)
Total Bilirubin: 0.3 mg/dL (ref 0.3–1.2)
Total Protein: 6.2 g/dL — ABNORMAL LOW (ref 6.5–8.1)

## 2022-07-14 LAB — CBG MONITORING, ED: Glucose-Capillary: 102 mg/dL — ABNORMAL HIGH (ref 70–99)

## 2022-07-14 LAB — TYPE AND SCREEN
ABO/RH(D): O POS
Antibody Screen: NEGATIVE

## 2022-07-14 LAB — LIPASE, BLOOD: Lipase: 25 U/L (ref 11–51)

## 2022-07-14 LAB — BRAIN NATRIURETIC PEPTIDE: B Natriuretic Peptide: 456 pg/mL — ABNORMAL HIGH (ref 0.0–100.0)

## 2022-07-14 MED ORDER — MIRTAZAPINE 15 MG PO TABS
15.0000 mg | ORAL_TABLET | Freq: Every day | ORAL | Status: DC
  Administered 2022-07-14 – 2022-07-16 (×3): 15 mg via ORAL
  Filled 2022-07-14 (×3): qty 1

## 2022-07-14 MED ORDER — MIRTAZAPINE 15 MG PO TABS: 15.0000 mg | ORAL_TABLET | Freq: Every day | ORAL | Status: DC

## 2022-07-14 MED ORDER — METOPROLOL SUCCINATE ER 25 MG PO TB24: 25.0000 mg | ORAL_TABLET | Freq: Every day | ORAL | Status: DC

## 2022-07-14 MED ORDER — OXYCODONE HCL 5 MG PO TABS
5.0000 mg | ORAL_TABLET | ORAL | Status: DC | PRN
  Administered 2022-07-14 – 2022-07-17 (×7): 5 mg via ORAL
  Filled 2022-07-14 (×7): qty 1

## 2022-07-14 MED ORDER — ACETAMINOPHEN 650 MG RE SUPP: 650.0000 mg | Freq: Four times a day (QID) | RECTAL | Status: DC | PRN

## 2022-07-14 MED ORDER — BUDESONIDE 0.25 MG/2ML IN SUSP
0.2500 mg | Freq: Two times a day (BID) | RESPIRATORY_TRACT | Status: DC
  Administered 2022-07-14 – 2022-07-17 (×6): 0.25 mg via RESPIRATORY_TRACT
  Filled 2022-07-14 (×6): qty 2

## 2022-07-14 MED ORDER — IRBESARTAN 75 MG PO TABS: 75.0000 mg | ORAL_TABLET | Freq: Every day | ORAL | Status: DC

## 2022-07-14 MED ORDER — METOPROLOL SUCCINATE ER 25 MG PO TB24
25.0000 mg | ORAL_TABLET | Freq: Every day | ORAL | Status: DC
  Administered 2022-07-15 – 2022-07-17 (×3): 25 mg via ORAL
  Filled 2022-07-14 (×3): qty 1

## 2022-07-14 MED ORDER — ALBUTEROL SULFATE (2.5 MG/3ML) 0.083% IN NEBU
2.5000 mg | INHALATION_SOLUTION | RESPIRATORY_TRACT | Status: DC | PRN
  Administered 2022-07-14 – 2022-07-15 (×2): 2.5 mg via RESPIRATORY_TRACT
  Filled 2022-07-14 (×2): qty 3

## 2022-07-14 MED ORDER — ATORVASTATIN CALCIUM 40 MG PO TABS
80.0000 mg | ORAL_TABLET | Freq: Every evening | ORAL | Status: DC
  Administered 2022-07-14 – 2022-07-16 (×3): 80 mg via ORAL
  Filled 2022-07-14 (×3): qty 2

## 2022-07-14 MED ORDER — IRBESARTAN 150 MG PO TABS
75.0000 mg | ORAL_TABLET | Freq: Every day | ORAL | Status: DC
  Administered 2022-07-15 – 2022-07-17 (×3): 75 mg via ORAL
  Filled 2022-07-14 (×3): qty 1

## 2022-07-14 MED ORDER — ASPIRIN 81 MG PO CHEW
324.0000 mg | CHEWABLE_TABLET | Freq: Once | ORAL | Status: AC
  Administered 2022-07-14: 324 mg via ORAL
  Filled 2022-07-14: qty 4

## 2022-07-14 MED ORDER — MORPHINE SULFATE (PF) 2 MG/ML IV SOLN
2.0000 mg | INTRAVENOUS | Status: DC | PRN
  Administered 2022-07-15: 2 mg via INTRAVENOUS
  Filled 2022-07-14: qty 1

## 2022-07-14 MED ORDER — ACETAMINOPHEN 325 MG PO TABS: 650.0000 mg | ORAL_TABLET | Freq: Four times a day (QID) | ORAL | Status: DC | PRN

## 2022-07-14 MED ORDER — PANTOPRAZOLE SODIUM 40 MG IV SOLR
40.0000 mg | Freq: Two times a day (BID) | INTRAVENOUS | Status: DC
  Administered 2022-07-14 – 2022-07-17 (×6): 40 mg via INTRAVENOUS
  Filled 2022-07-14 (×6): qty 10

## 2022-07-14 NOTE — Assessment & Plan Note (Signed)
Continue metoprolol, valsartan

## 2022-07-14 NOTE — ED Notes (Signed)
Pt family to nurses station requesting pt be put on oxygen as he wears it at night when sleeping- this nurse to room O2 reads 98% on room air, pt eating a donut, says he is short of breath- informed pt that he doesn't appear short of breath as he is eating a donut , O2 is 98% and pt breathing is not labored, pt able to speak full sentences. Pt insists that he needs oxygen, this nurse placed pt on O2 via [place] @ 1L for pt satisfaction.

## 2022-07-14 NOTE — Progress Notes (Signed)
Gregory Mitchell, male    DOB: [birth year]    MRN: [id number]   Brief patient profile:  34   yobm  quit smoking 1978 no apparent  resp sequelae referred to pulmonary clinic in [facility]  01/28/2022 for copd eval  by Bernerd Pho PA following in cards clinic for severe chf     Prior pulmonary eval 2015 c/o doe x 2 y with GOLD 3 criteria at that point with hrct 11/16/13 with bronchiectasis ? MAI   Rx 02 around 2021  ? After covid ?   History of Present Illness  01/28/2022  Pulmonary/ 1st office eval/ [name] / [name] / trelegy maint Chief Complaint  Patient presents with   Consult    Ref by cardiology for copd  does have O2 at home he uses prn   Dyspnea:  maybe 100 ft at grocery handicap parking  Cough: x  years worse x one year p covid 19 in 05/2020 along with 02 need assoc with hoarsness / min  white mucus production worse in ams Sleep:on side flat bed 2 pillows  SABA use: not really helping  02 :  3lpm hs and prn but not titrating  Rec Stop trelegy and the ventolin / albuterol  Pantoprazole 40 mg Take 30- 60 min before your first and last meals of the day  Take duoneb  four times daily  Add budesonide 0.25 mg twice daily to the nebulizer  Prednisone 10 mg take  4 each am x 2 days,   2 each am x 2 days,  1 each am x 2 days and stop     Allergy screen 01/28/22  >  Eos 0.2 /  IgE  / ESR 41.  Wear 02 so you keep your 02 saturation s above 90% at all time  For cough > mucinex 1200 mg every 12 hours and Use the flutter valve as much possible Please schedule a follow up office visit in 4 weeks, sooner if needed  with all medications /inhalers/ solutions     02/24/2022  f/u ov/[place] office/[name] re: copd/uacs  maint on nebs but confused on which ones when / also still on entresto   Chief Complaint  Patient presents with   Follow-up    Feels breathing is getting worse.   Dyspnea:  food [employer] leaning on cart/ s 02  Cough: yellowish better p levaquin then worse again   Sleeping: on bed is level/flat with 2 pillows  SABA use: not much hfa 02: 4lpm 24/7  Covid status: vax x 2  Rec Protonix 40 mg Take 30- 60 min before your first and last meals of the day  - new prescription today  Prednisone Take 4 for three days 3 for three days 2 for three days 1 for three days and stop  Levaquin 500 mg daily  x 7 days  Plan A = Automatic = Always=    Aformoterol 15 mcg 1st thing in am and 12 hours and budsonide  0.5  Plan B = Backup (to supplement plan A, not to replace it) Only use your albuterol nebulizer  as a rescue medication Stop fish oil  GERD diet     Admit date: 02/26/2022 Discharge date: 03/02/2022  Discharge to: Home with home health to follow-up with pulmonology and cardiology as well as PCP Discharge Diagnoses:  Pneumonia of left lower lobe due to infectious organism COPD (chronic obstructive pulmonary disease) (CMS-HCC) Heart failure with reduced ejection fraction (CMS-HCC) History of pulmonary embolus (  PE) Diabetes mellitus type 2, noninsulin dependent (CMS-HCC) Dysphagia Resolved Problems: Shortness of breath TIA (transient ischemic attack) Atypical chest pain   Procedures: CT head 7/22 CT abdomen and pelvis, chest 7/22 Chest x-ray 7/22 MRI brain 7/24 V/Q scan 7/24 Venous duplex bilateral lower extremities 7/24       03/08/2022  f/u ov/[place] office/[name] re: GOLD 3  maint on formoterol  but thoroughly confused with meds/ instructoins from [facility] Complaint  Patient presents with   Follow-up    Patient feels he is about the same since last visit 2 weeks ago  Dyspnea:  walked in with cane x 50 fts  Cough: yellow mucus looks the same on / off levaquin Sleeping: 4lpm and flat bed/ 2 pillows  SABA use: confused  02: 4lpm hs and prn  Covid status: vax x 3  Rec Protonix (pantoprazole) 40 mg Take 30- 60 min before your first and last meals of the day  - new prescription today  Plan A = Automatic = Always=    Aformoterol 15  mcg 1st thing in am and 12 hours and budsonide 0.25 twice daily  Plan B = Backup (to supplement plan A, not to replace it) Only use your albuterol nebulizer  as a rescue medication  GERD rx Stop entresto  Start valsartan  For cough > mucinex dm 1200 mg every 12 hours and use flutter valve as much as possible   Please schedule a follow up office visit in 4 weeks, sooner if needed  with all medications /inhalers/ solutions/ flutter valve in hand       Discharge Date:  04/16/2022  1)Please talk to your cardiologist about restarting SACUBITRIL-VALSARTAN 24-26 MG PO TABS/Entresto.  Which is a medication for heart failure.. And possibly restarting apixaban/Eliquis which is a blood thinner 2)Avoid ibuprofen/Advil/Aleve/Motrin/Goody Powders/Naproxen/BC powders/Meloxicam/Diclofenac/Indomethacin and other Nonsteroidal anti-inflammatory medications as these will make you more likely to bleed and can cause stomach ulcers, can also cause Kidney problems.  3)You need oxygen at home at  3 to 4 L via nasal cannula continuously while awake and while asleep--- smoking or having open fires around oxygen can cause fire, significant injury and death 4)Repeat CBC and BMP in about a week or so   5)Very low-salt diet advised--- very low-sodium diet advised   6)Weigh yourself daily, call if you gain more than 3 pounds in 1 day or more than 5 pounds in 1 week as your diuretic medications may need to be adjusted     Admission Diagnosis  COPD exacerbation ([place]) [J44.1] Acute respiratory failure with hypoxia ([place]) [J96.01] PNA (pneumonia) [J18.9]     Discharge Diagnosis  COPD exacerbation ([place]) [J44.1] Acute respiratory failure with hypoxia (HCC) [J96.01] PNA (pneumonia) [J18.9]     Principal Problem:   PNA (pneumonia)   Type 2 diabetes mellitus ([place])   Essential hypertension   COPD GOLD 3 / obstructive bronchiectasis by HRCT in 2015    Chronic kidney disease, stage 3a ([place])   Chronic combined systolic and  diastolic CHF (congestive heart failure) ([place])   Pulmonary embolism (HCC)   Chronic respiratory failure with hypoxia ([place])   Speech abnormality   04/22/2022  post hosp x 2 f/u ov/ office/Yalitza Teed re: GOLD 3  maint on confused again re nebs/inhalers - uses bubble pack for pills and did bring the receipts for those   Chief Complaint  Patient presents with   Follow-up    He has improved some since last OV.   Dyspnea:  food  lion walking pushing cart s 02  Cough: some better off entresto  Sleeping: flat bed 2 pillows ok  SABA use: twice a daily inhaler/ also neb but not sure what  02: 3lpm 24/7  Rec Continue budesonide with the other nebulizer solution twice daily but call me with the name Work on inhaler technique: Make sure you check your oxygen saturation  AT  your highest level of activity (not after you stop)   to be sure it stays over 90%        05/06/2022  f/u ov/[place] office/Maribeth Jiles re: GOLD 3 copd  maint on performist/ budesonide   Chief Complaint  Patient presents with   Follow-up    Breathing is not doing well today   Dyspnea:  did food lion one week prior to OV  /  50 ft to mailbox s 02 Cough: no  Sleeping: flat bed 2pillows  SABA use: last used weeks  02: 3lpm hs and prn daytime  Covid status: vax x 2 or 3  Nose drips a lot / nothing purulent  Rec My office will be contacting you by phone for referral to ENT   - if you don't hear back from my office within one week please call us back or notify us thru MyChart and we'll address it right away.  Use purse lip breathing when you wheeze For cough > mucinex dm 1200 mg every 12 hours and use flutter valve as much as possible   Plan A = Automatic = Always=    Aformoterol/bud 1st thing in am and 12 hours later  Plan B = Backup (to supplement plan A, not to replace it) Only use your ipatropium/albuterol inhaler as a rescue medication  If worse > Prednisone 10 mg take  4 each am x 2 days,   2 each am x 2 days,  1 each  am x 2 days and stop   Please schedule a follow up visit in 2 months but call sooner if needed      07/14/2022  f/u ov/[place] office/Sheryn Aldaz re: f/u  maint on albuterol / bud  Chief Complaint  Patient presents with   Follow-up    Chest pain that is radiating to his stomach. States this started today and wanted to f/u with Dr. Melvyn Novas before going to ER   Onset of cp x 5 days worse with walking and with deep breath -  better supine no assoc   no nausea or vomiting  Dyspnea:  more sob with onset / not using gerd rx bid  Cough: none  Sleeping: flat bed with 2 pillows  SABA use: neb helps  02: 3lpm hs and prn  Last pred several weeks ago helped breathing / no real flare of cough since    No obvious day to day or daytime variability or assoc excess/ purulent sputum or mucus plugs or hemoptysis or  subjective wheeze or overt sinus or hb symptoms.    Also denies any obvious fluctuation of symptoms with weather or environmental changes or other aggravating or alleviating factors except as outlined above   No unusual exposure hx or h/o childhood pna/ asthma or knowledge of premature birth.  Current Allergies, Complete Past Medical History, Past Surgical History, Family History, and Social History were reviewed in [employer] record.  ROS  The following are not active complaints unless bolded Hoarseness, sore throat, dysphagia, dental problems, itching, sneezing,  nasal congestion or discharge of excess mucus or purulent secretions, ear ache,  fever, chills, sweats, unintended wt loss or wt gain,    orthopnea pnd or arm/hand swelling  or leg swelling, presyncope, palpitations, abdominal pain, anorexia, nausea, vomiting, diarrhea  or change in bowel habits or change in bladder habits, change in stools or change in urine, dysuria, hematuria,  rash, arthralgias, visual complaints, headache, numbness, weakness or ataxia or problems with walking or coordination,  change in mood or   memory.        Current Meds - - NOTE:   Unable to verify as accurately reflecting what pt takes    Medication Sig   albuterol (PROVENTIL) (2.5 MG/3ML) 0.083% nebulizer solution Take 3 mLs (2.5 mg total) by nebulization every 4 (four) hours as needed for wheezing or shortness of breath.   atorvastatin (LIPITOR) 80 MG tablet Take 80 mg by mouth every evening.   budesonide (PULMICORT) 0.25 MG/2ML nebulizer solution Take 2 mLs (0.25 mg total) by nebulization 2 (two) times daily.   dapagliflozin propanediol (FARXIGA) 10 MG TABS tablet Take 1 tablet (10 mg total) by mouth daily before breakfast.   fluconazole (DIFLUCAN) 200 MG tablet Take 2 tablets on day 1 then 1 tablet daily thereafter for a total of 21 days.   guaiFENesin (MUCINEX) 600 MG 12 hr tablet Take 1 tablet (600 mg total) by mouth 2 (two) times daily.   HM SENNA-S 8.6-50 MG tablet Take 2 tablets by mouth daily.   HYDROcodone-acetaminophen (NORCO) 7.5-325 MG tablet Take 1 tablet by mouth 2 (two) times daily as needed for moderate pain.   lactulose (CHRONULAC) 10 GM/15ML solution Take 30 mLs (20 g total) by mouth 3 (three) times daily as needed for mild constipation.   metoprolol succinate (TOPROL XL) 25 MG 24 hr tablet Take 1 tablet (25 mg total) by mouth daily.   OXYGEN Inhale 4 L into the lungs daily as needed (for shortness of breath).   pantoprazole (PROTONIX) 40 MG tablet Take 1 tablet (40 mg total) by mouth daily.   tamsulosin (FLOMAX) 0.4 MG CAPS capsule Take 0.8 mg by mouth 2 (two) times daily.   torsemide (DEMADEX) 20 MG tablet Take 1 tablet (20 mg total) by mouth as needed (swelling).                 Past Medical History:  Diagnosis Date   Asthma    Chronic renal insufficiency    COPD (chronic obstructive pulmonary disease) (HCC)    Heart failure ([place])    a. EF 25-30% in 08/2019 with cath showing normal cors b. EF 20-25% in 09/2020 c. 30-35% in 02/2021 d. EF 20-25% in 10/2021   Hypertension    Pulmonary emboli ([place])  03/2019   Renal insufficiency    Type 2 diabetes mellitus (HCC)        Objective:    Wts  07/14/2022       134   05/06/2022       139 04/22/2022       144  03/08/2022       148   02/24/22 151 lb 3.2 oz (68.6 kg)  01/28/22 148 lb (67.1 kg)  12/18/21 149 lb (67.6 kg)      Vital signs reviewed  07/14/2022  - Note at rest 02 sats  97% on RA   General appearance:    chronically ill somber hoarse  bm nad     HEENT : Oropharynx  clear       NECK :  without  apparent JVD/ palpable Nodes/TM  LUNGS: no acc muscle use,  Mild barrel  contour chest wall with bilateral  Distant bs s audible wheeze and  without cough on insp or exp maneuvers  and mild  Hyperresonant  to  percussion bilaterally     CV:  RRR  no s3 or murmur or increase in P2, and no edema   ABD:  soft and nontender with pos end  insp Hoover's  in the supine position.  No bruits or organomegaly appreciated   MS:  Nl gait/ ext warm without deformities Or obvious joint restrictions  calf tenderness, cyanosis or clubbing     SKIN: warm and dry without lesions    NEURO:  alert, approp, nl sensorium with  no motor or cerebellar deficits apparent.      I personally reviewed images and agree with radiology impression as follows:  CT sinus 04/14/22 There is marked severity bilateral maxillary sinus and bilateral ethmoid sinus mucosal thickening.          Assessment

## 2022-07-14 NOTE — Assessment & Plan Note (Addendum)
MBS 04/29/22  Recommended Consults Consider GI evaluation     SLP Diet Recommendations Dysphagia 3 (mechanical soft);Thin     Liquid Administration via Cup     Medication Administration Whole meds with liquid     Supervision Patient able to self feed     Compensations Small sips/bites;Multiple dry swallows after each bite/sip     Postural Changes Seated upright at 90 degrees;Remain upright for at least 30 minutes after feeds/meals   04/29/22 DgEs  1. Mild midthoracic esophageal narrowing, cannot exclude Barretts esophagus. A barium pill left over from the modified barium swallow all remained impacted just above this area of subtle narrowing. No significant mucosal irregularity observed. Endoscopy may be warranted. 2. Nonspecific esophageal dysmotility disorder, with the distal esophagus clearing primarily due to secondary contractions. Fold thickening in the distal esophagus noted suggesting esophagitis. 3. We were not able to fully distend the distal esophagus, cannot exclude smooth stricturing. 4. Small type 1 hiatal hernia.>>>  Referred to GI [place]   ? Now contributing to atypical cp?

## 2022-07-14 NOTE — Assessment & Plan Note (Signed)
-   Secondary to COPD and some pulmonary fibrosis with chronic changes on chest x-ray - 3 L nasal cannula at baseline - Continue 3 L nasal cannula - Continue Pulmicort - Albuterol as needed - Continue to monitor

## 2022-07-14 NOTE — ED Triage Notes (Signed)
Pt c/o mid sternal chest pain that radiates down toward his abdomen; pt state the pain started yesterday and describes it as a pressure;  Pt state he is sob all the time and has no increase in sob  Pt also c/o some decrease with his urine output

## 2022-07-14 NOTE — Assessment & Plan Note (Signed)
-   Last echo in March 2023 shows an ejection fraction of 20-25% - appears euvolemic at this time - Continue beta-blocker, statin, ARB - Troponin slightly elevated at 22, which is actually downtrending from last troponin - Cardiac etiology of chest/epigastric pain today is low on the differential - Continue to monitor

## 2022-07-14 NOTE — Assessment & Plan Note (Signed)
-   Troponin 22 - Last troponin was 26, 1 month ago - Repeat troponin 23-flat - Low suspicion for cardiac etiology to chest pain

## 2022-07-14 NOTE — Assessment & Plan Note (Signed)
Albumin low at 2.7 - Likely secondary to poor p.o. intake - When patient is able to tolerate food encourage nutrient dense food choices

## 2022-07-14 NOTE — Assessment & Plan Note (Addendum)
Placed on 02 around 2021  - 88% p walking 100 ft 01/28/2022 > rec 3lpm and titrate daytime to keep > 90% at all times as has chf  - 03/08/2022   Walked on RA   x  3  lap(s) =  approx 450  ft  @ moderately slow/cane pace, stopped due to end of study with lowest 02 sats 91% and sob p 1st lap but never stopped   - 05/06/2022   Walked on RA  x  1  lap(s) =  approx 150  ft  @ slow/cane assist pace, stopped due to tired > sob with lowest 02 sats 94%   Again advised: Make sure you check your oxygen saturation  AT  your highest level of activity (not after you stop)   to be sure it stays over 90% and adjust  02 flow upward to maintain this level if needed but remember to turn it back to previous settings when you stop (to conserve your supply).   Each maintenance medication was reviewed in detail including emphasizing most importantly the difference between maintenance and prns and under what circumstances the prns are to be triggered using an action plan format where appropriate.  Total time for H and P, chart review, counseling, reviewing neb/02  device(s) and generating customized AVS unique to this office visit / same day charting > 30 min for multiple  refractory respiratory  symptoms of uncertain etiology

## 2022-07-14 NOTE — Assessment & Plan Note (Signed)
-   With melena and decreasing hemoglobin - Concern for peptic ulcer disease - Protonix twice daily - N.p.o. - Consult GI - Patient was already typed and screened - Trend hemoglobin every 6 hours - Continue to monitor

## 2022-07-14 NOTE — Assessment & Plan Note (Addendum)
Quit smoking 1978 PFT 11/16/13   FEV1 37%, ratio 61 , no sign BD response Decreased FVC 45% (19% BD response) . DLCO was unable to be done.  - pulmonary eval 01/28/2022 severe coughing on trelegy and entresto rec duoneb/flutter/mucinex dm and stop trelegy  Then ? Trial off entresto and on just valsartan?  - Allergy screen 01/28/22  >  Eos 0.2 /  IgE  7 - 02/24/2022 changed to bud/perfomist bid with prn duoneb/ 12 d pred taper and levquin 500 x 7days with f/u  q 2 weeks and strongly consider d/c entresto but EF only 25% - 02/24/2022  After extensive coaching inhaler device,  effectiveness =    0% with hfa  - 03/08/2022 d/c entresto (pseudowheeze) > improved 04/22/2022  - MBS/ DgEs 04/23/2022 >>>see Es dysfunction  - Pred x 6 d prn 05/06/2022 >>> improved while on prednisone but 07/14/2022 confused with names / use of prn vs maint pulmonary meds or the action plan written out for at last ov so needs to return with all meds in hand using a trust but verify approach to confirm accurate Medication  Reconciliation The principal here is that until we are certain that the  patients are doing what we've asked, it makes no sense to ask them to do more.   Cough is better off entresto but now sob worse and now atypical midline cp ? Related to GI dysfunction or angina and if it is the latter it is unstable at this point > to ER for evaluation though I think likely this is non-cardiac and he'll be able to go home to continue this challenging out pt w/u

## 2022-07-14 NOTE — ED Provider Notes (Signed)
[facility] EMERGENCY DEPARTMENT Provider Note   CSN: [id number] Arrival date & time: 07/14/22  1710     History {Add pertinent medical, surgical, social history, OB history to HPI:1} Chief Complaint  Patient presents with   Chest Pain    Gregory Mitchell is a 81 y.o. male.   Chest Pain    This patient is an 81 year old male, he has a history of COPD, quit smoking in 1978, prior history of heart failure with an ejection fraction of 25 to 30%, normal coronaries in January 2021, ejection fraction of 20 to 25% in March 2023, he does have a history of pulmonary emboli and has been on anticoagulants in the past, he had been on Eliquis but according to a family member this has recently been discontinued, in fact the patient was on this medication when he was admitted to the hospital in September 2023 for an acute respiratory failure with hypoxia and COPD exacerbation.  Heart catheterization from January 2021 showed no coronary obstructive disease  He presents today from the pulmonology offices where he followed up with his pulmonologist Dr. Melvyn Novas, he was complaining of epigastric pain radiating into the chest ongoing for the last 2 days but intermittent, seems to be somewhat exertional, associated with shortness of breath but no swelling of the legs or fevers or chills  This patient was noted to have esophageal candidiasis with gastritis on an upper endoscopy from January 2023, at that time he also had a colonoscopy which was unremarkable.  He was started on Diflucan for 21 days.  Home Medications Prior to Admission medications   Medication Sig Start Date End Date Taking? Authorizing Provider  albuterol (PROVENTIL) (2.5 MG/3ML) 0.083% nebulizer solution Take 3 mLs (2.5 mg total) by nebulization every 4 (four) hours as needed for wheezing or shortness of breath. 04/16/22 04/16/23  Roxan Hockey, MD  atorvastatin (LIPITOR) 80 MG tablet Take 80 mg by mouth every evening. 04/10/22   [provider]  budesonide (PULMICORT) 0.25 MG/2ML nebulizer solution Take 2 mLs (0.25 mg total) by nebulization 2 (two) times daily. 01/28/22   Tanda Rockers, MD  dapagliflozin propanediol (FARXIGA) 10 MG TABS tablet Take 1 tablet (10 mg total) by mouth daily before breakfast. 02/25/22   Arnoldo Lenis, MD  fluconazole (DIFLUCAN) 200 MG tablet Take 2 tablets on day 1 then 1 tablet daily thereafter for a total of 21 days. 06/03/22   Eloise Harman, DO  guaiFENesin (MUCINEX) 600 MG 12 hr tablet Take 1 tablet (600 mg total) by mouth 2 (two) times daily. 04/16/22 04/16/23  Roxan Hockey, MD  HM SENNA-S 8.6-50 MG tablet Take 2 tablets by mouth daily. 07/10/21   [provider]  HYDROcodone-acetaminophen (NORCO) 7.5-325 MG tablet Take 1 tablet by mouth 2 (two) times daily as needed for moderate pain. 11/27/20   [provider]  lactulose (CHRONULAC) 10 GM/15ML solution Take 30 mLs (20 g total) by mouth 3 (three) times daily as needed for mild constipation. 03/13/22   Mahala Menghini, PA-C  metoprolol succinate (TOPROL XL) 25 MG 24 hr tablet Take 1 tablet (25 mg total) by mouth daily. 12/18/21   Strader, Fransisco Hertz, PA-C  OXYGEN Inhale 4 L into the lungs daily as needed (for shortness of breath).    [provider]  pantoprazole (PROTONIX) 40 MG tablet Take 1 tablet (40 mg total) by mouth daily. 04/16/22   Roxan Hockey, MD  tamsulosin (FLOMAX) 0.4 MG CAPS capsule Take 0.8 mg by  mouth 2 (two) times daily.    [provider]  torsemide (DEMADEX) 20 MG tablet Take 1 tablet (20 mg total) by mouth as needed (swelling). 03/23/22   Arnoldo Lenis, MD      Allergies    Patient has no known allergies.    Review of Systems   Review of Systems  Cardiovascular:  Positive for chest pain.  All other systems reviewed and are negative.   Physical Exam Updated Vital Signs BP 125/74 (BP Location: Left Arm)   Pulse 83   Temp 99.4 F (37.4 C) (Oral)   Resp 18   Ht 1.676  m ('5\' 6"'$ )   Wt 60.3 kg   SpO2 99%   BMI 21.47 kg/m  Physical Exam Vitals and nursing note reviewed.  Constitutional:      General: He is not in acute distress.    Appearance: He is well-developed.  HENT:     Head: Normocephalic and atraumatic.     Mouth/Throat:     Pharynx: No oropharyngeal exudate.  Eyes:     General: No scleral icterus.       Right eye: No discharge.        Left eye: No discharge.     Conjunctiva/sclera: Conjunctivae normal.     Pupils: Pupils are equal, round, and reactive to light.  Neck:     Thyroid: No thyromegaly.     Vascular: No JVD.  Cardiovascular:     Rate and Rhythm: Normal rate and regular rhythm.     Heart sounds: Normal heart sounds. No murmur heard.    No friction rub. No gallop.  Pulmonary:     Effort: Pulmonary effort is normal. No respiratory distress.     Breath sounds: Normal breath sounds. No wheezing or rales.  Abdominal:     General: Bowel sounds are normal. There is no distension.     Palpations: Abdomen is soft. There is no mass.     Tenderness: There is abdominal tenderness.  Musculoskeletal:        General: No tenderness. Normal range of motion.     Cervical back: Normal range of motion and neck supple.     Right lower leg: No tenderness. No edema.     Left lower leg: No tenderness. No edema.  Lymphadenopathy:     Cervical: No cervical adenopathy.  Skin:    General: Skin is warm and dry.     Findings: No erythema or rash.  Neurological:     Mental Status: He is alert.     Coordination: Coordination normal.  Psychiatric:        Behavior: Behavior normal.     ED Results / Procedures / Treatments   Labs (all labs ordered are listed, but only abnormal results are displayed) Labs Reviewed - No data to display  EKG EKG Interpretation  Date/Time:  Wednesday July 14 2022 17:30:35 EST Ventricular Rate:  85 PR Interval:  212 QRS Duration: 137 QT Interval:  422 QTC Calculation: 502 R Axis:   -72 Text  Interpretation: Sinus rhythm Multiple ventricular premature complexes Borderline prolonged PR interval RBBB and LAFB Abnrm T, consider ischemia, anterolateral lds Confirmed by Noemi Chapel [phone]) on 07/14/2022 5:45:29 PM  Radiology No results found.  Procedures Procedures  {Document cardiac monitor, telemetry assessment procedure when appropriate:1}  Medications Ordered in ED Medications - No data to display  ED Course/ Medical Decision Making/ A&P  Medical Decision Making Amount and/or Complexity of Data Reviewed Labs: ordered. Radiology: ordered.  Risk OTC drugs.   This patient presents to the ED for concern of chest pain and shortness of breath with epigastric discomfort and tenderness, this involves an extensive number of treatment options, and is a complaint that carries with it a high risk of complications and morbidity.  The differential diagnosis includes pancreatitis, coronary disease, congestive heart failure, aneurysm.  The patient last had a CT angiogram of his chest about 6 weeks ago and a CT abdomen at that same time.  The angiogram showed no pulmonary embolism, interstitial coarsening of the lungs and fibrosis.  The CT scan of the abdomen and pelvis showed that the patient had no acute surgical pathology.  Will consider pancreatitis and peptic ulcer disease   Co morbidities that complicate the patient evaluation  End-stage COPD, congestive heart failure   Additional history obtained:  Additional history obtained from EMR External records from outside source obtained and reviewed including Plm notes and recent admissions   Lab Tests:  I Ordered, and personally interpreted labs.  The pertinent results include: Hemoglobin of 7.3, this is downtrending in over a gram lower than it had been previously.  He has noted to have chronic iron deficiency anemia, today he has an MCV of 94, platelets are normal, white blood cell count is normal.  BNP  is 456, lipase is normal, metabolic panel without significant renal dysfunction,   Imaging Studies ordered:  I ordered imaging studies including portable chest x-ray I independently visualized and interpreted imaging which showed chronic changes at the left base but no other acute findings I agree with the radiologist interpretation   Cardiac Monitoring: / EKG:  The patient was maintained on a cardiac monitor.  I personally viewed and interpreted the cardiac monitored which showed an underlying rhythm of: Abnormal EKG with what appears to be new T wave abnormalities in leads V4, V5 and V6.   Consultations Obtained:  I requested consultation with the hospitalist,  and discussed lab and imaging findings as well as pertinent plan - they recommend: Admission, likely needs 1 unit of packed red blood cells and monitoring of hemoglobin as well as trending of troponins.   Problem List / ED Course / Critical interventions / Medication management  *** I ordered medication including ***  for ***  Reevaluation of the patient after these medicines showed that the patient {resolved/improved/worsened:23923::"improved"} I have reviewed the patients home medicines and have made adjustments as needed   Social Determinants of Health:  ***   Test / Admission - Considered:  ***   {Document critical care time when appropriate:1} {Document review of labs and clinical decision tools ie heart score, Chads2Vasc2 etc:1}  {Document your independent review of radiology images, and any outside records:1} {Document your discussion with family members, caretakers, and with consultants:1} {Document social determinants of health affecting pt's care:1} {Document your decision making why or why not admission, treatments were needed:1} Final Clinical Impression(s) / ED Diagnoses Final diagnoses:  None    Rx / DC Orders ED Discharge Orders     None

## 2022-07-14 NOTE — Assessment & Plan Note (Addendum)
-   Different than his normal chronic chest pain - Not likely to be cardiac related with flat troponin - no acute ST segment changes - Echo stable from recent testing, do not feel this is ACS - Monitor on telemetry

## 2022-07-14 NOTE — Assessment & Plan Note (Signed)
-   Holding p.o. Protonix - Twice daily IV Protonix

## 2022-07-14 NOTE — H&P (Signed)
History and Physical    Patient: Gregory Mitchell DOB: Mar 27, 1941 DOA: 07/14/2022 DOS: the patient was seen and examined on 07/14/2022 PCP: Beola Cord, [place]  Patient coming from: Home  Chief Complaint:  Chief Complaint  Patient presents with   Chest Pain   HPI: Gregory Mitchell is a 81 y.o. male with medical history significant of asthma, chronic renal insufficiency, COPD, hypertension, history of PE no longer on anticoagulation, and more presents to ED with a chief complaint of chest pain and epigastric pain.  Patient has had chronic chest pain, but reports that this is different because it goes down into his abdomen. Patient reports that it all started with chest tightness. The tightness was intermittent. Using his nebulizer seemed to improve the pain. Sitting down or laying down seemed to make it worse, per the patient. He does wear 3L nasal cannula at home. He noticed himself wheezing, but that was also improved with neb treatments. He has had a productive cough with yellow sputum. He believes his cough is not much different than normal. Patient reports that his epigastric pain feels like a cramping. It is also intermittent. The cramping is worse when he has increased work of breathing. Eating makes his pain worse as well, which has affected his appetite. His last normal meal was 12/5. Patient denies nausea and vomiting. He reports that he has had 2 melenotic stools daily for a "few" days. He has been on anticoagulation in the past - eliquis - but he is not on it any longer. Daughter at bedside thinks he was taken off of it because of GI bleed. Patient has had to be transfused in the past, and is willing to be transfused again. When he last had to be transfused his only symptom was cold intolerance per the daughter. He did not have chest pain at that time. Patient has no other complaints at this time.   Patient does not smoke, does not drink EtOH, and does not use illicit drugs.  He is vaccinated for covid. He is full code.  Review of Systems: As mentioned in the history of present illness. All other systems reviewed and are negative. Past Medical History:  Diagnosis Date   Asthma    Chronic renal insufficiency    COPD (chronic obstructive pulmonary disease) (HCC)    Heart failure ([place])    a. EF 25-30% in 08/2019 with cath showing normal cors b. EF 20-25% in 09/2020 c. 30-35% in 02/2021 d. EF 20-25% in 10/2021   Hypertension    Pulmonary emboli ([place]) 03/2019   Renal insufficiency    Type 2 diabetes mellitus ([place])    Past Surgical History:  Procedure Laterality Date   APPENDECTOMY     BALLOON DILATION N/A 03/18/2020   Procedure: BALLOON DILATION;  Surgeon: Eloise Harman, DO;  [facility]: [facility];  Service: Endoscopy;  Laterality: N/A;   CHOLECYSTECTOMY     COLONOSCOPY  10/2006   Dr.Anwar:normal   ESOPHAGEAL BRUSHING  08/24/2021   Procedure: ESOPHAGEAL BRUSHING;  Surgeon: Eloise Harman, DO;  Location: [facility];  Service: Endoscopy;;   ESOPHAGOGASTRODUODENOSCOPY (EGD) WITH PROPOFOL N/A 03/18/2020   Procedure: ESOPHAGOGASTRODUODENOSCOPY (EGD) WITH PROPOFOL;  Surgeon: Eloise Harman, DO;  Location: [facility];  Service: Endoscopy;  Laterality: N/A;  2:30pm   ESOPHAGOGASTRODUODENOSCOPY (EGD) WITH PROPOFOL N/A 08/24/2021   Procedure: ESOPHAGOGASTRODUODENOSCOPY (EGD) WITH PROPOFOL;  Surgeon: Eloise Harman, DO;  Location: [facility];  Service: Endoscopy;  Laterality: N/A;  with dilation   FLEXIBLE SIGMOIDOSCOPY N/A 08/24/2021   Procedure: FLEXIBLE SIGMOIDOSCOPY;  Surgeon: Eloise Harman, DO;  Location: [facility];  Service: Endoscopy;  Laterality: N/A;   NASAL SINUS SURGERY     NEPHRECTOMY     RIGHT (Question CA.  No further therapy needed.)   RIGHT/LEFT HEART CATH AND CORONARY ANGIOGRAPHY N/A 09/03/2019   Procedure: RIGHT/LEFT HEART CATH AND CORONARY ANGIOGRAPHY;  Surgeon: Lorretta Harp, MD;  Location: [place] [facility];  Service:  Cardiovascular;  Laterality: N/A;   Social History:  reports that he quit smoking about 45 years ago. His smoking use included cigarettes. He started smoking about 65 years ago. He has a 15.00 pack-year smoking history. He quit smokeless tobacco use about 16 years ago.  His smokeless tobacco use included chew. He reports that he does not currently use alcohol. He reports that he does not use drugs.  No Known Allergies  Family History  Problem Relation Age of Onset   Stroke Mother    Breast cancer Mother    Lung cancer Brother    Colon cancer Neg Hx     Prior to Admission medications   Medication Sig Start Date End Date Taking? Authorizing Provider  albuterol (PROVENTIL) (2.5 MG/3ML) 0.083% nebulizer solution Take 3 mLs (2.5 mg total) by nebulization every 4 (four) hours as needed for wheezing or shortness of breath. 04/16/22 04/16/23 Yes Emokpae, Courage, MD  albuterol (VENTOLIN HFA) 108 (90 Base) MCG/ACT inhaler Inhale into the lungs. 07/13/22  Yes [provider]  budesonide (PULMICORT) 0.25 MG/2ML nebulizer solution Take 2 mLs (0.25 mg total) by nebulization 2 (two) times daily. 01/28/22  Yes Tanda Rockers, MD  dapagliflozin propanediol (FARXIGA) 10 MG TABS tablet Take 1 tablet (10 mg total) by mouth daily before breakfast. 02/25/22  Yes Branch, Alphonse Guild, MD  guaiFENesin (MUCINEX) 600 MG 12 hr tablet Take 1 tablet (600 mg total) by mouth 2 (two) times daily. 04/16/22 04/16/23 Yes Emokpae, Courage, MD  HM SENNA-S 8.6-50 MG tablet Take 2 tablets by mouth daily. 07/10/21  Yes [provider]  HYDROcodone-acetaminophen (NORCO) 7.5-325 MG tablet Take 1 tablet by mouth 2 (two) times daily as needed for moderate pain. 11/27/20  Yes [provider]  ipratropium-albuterol (DUONEB) 0.5-2.5 (3) MG/3ML SOLN Take 3 mLs by nebulization 4 (four) times daily. 06/30/22  Yes [provider]  metoprolol succinate (TOPROL XL) 25 MG 24 hr tablet Take 1 tablet (25 mg total) by mouth  daily. 12/18/21  Yes Strader, [place] M, PA-C  mirtazapine (REMERON) 15 MG tablet Take 15 mg by mouth daily. 07/07/22  Yes [provider]  OXYGEN Inhale 3 L into the lungs daily as needed (for shortness of breath).   Yes [provider]  pantoprazole (PROTONIX) 40 MG tablet Take 1 tablet (40 mg total) by mouth daily. 04/16/22  Yes Emokpae, Courage, MD  tamsulosin (FLOMAX) 0.4 MG CAPS capsule Take 0.8 mg by mouth 2 (two) times daily.   Yes [provider]  torsemide (DEMADEX) 20 MG tablet Take 1 tablet (20 mg total) by mouth as needed (swelling). 03/23/22  Yes Arnoldo Lenis, MD  valsartan (DIOVAN) 80 MG tablet Take 80 mg by mouth daily. 07/13/22  Yes [provider]  atorvastatin (LIPITOR) 80 MG tablet Take 80 mg by mouth every evening. Patient not taking: Reported on 07/14/2022 04/10/22   [provider]  lactulose (CHRONULAC) 10 GM/15ML solution Take 30 mLs (20 g total) by mouth 3 (three)  times daily as needed for mild constipation. Patient not taking: Reported on 07/14/2022 03/13/22   Mahala Menghini, PA-C    Physical Exam: Vitals:   07/14/22 1737 07/14/22 1738 07/14/22 2016  BP: 125/74  126/71  Pulse: 83  98  Resp: 18  16  Temp: 99.4 F (37.4 C)  99.1 F (37.3 C)  TempSrc: Oral  Oral  SpO2: 99%  99%  Weight:  60.3 kg   Height:  '5\' 6"'$  (1.676 m)    1.  General: Patient lying supine in bed,  no acute distress   2. Psychiatric: Alert and oriented x 3, mood and behavior normal for situation, pleasant and cooperative with exam   3. Neurologic: Speech and language are normal, face is symmetric, moves all 4 extremities voluntarily, at baseline without acute deficits on limited exam   4. HEENMT:  Hard of hearing, Head is atraumatic, normocephalic, pupils reactive to light, neck is supple, trachea is midline, mucous membranes are moist   5. Respiratory : Lungs are clear to auscultation bilaterally without wheezing, rhonchi, rales, no cyanosis,  no increase in work of breathing or accessory muscle use   6. Cardiovascular : Heart rate normal, rhythm is regular, no murmurs, rubs or gallops, no peripheral edema, peripheral pulses palpated   7. Gastrointestinal:  Abdomen is soft, nondistended, nontender to palpation bowel sounds active, no masses or organomegaly palpated   8. Skin:  Skin is warm, dry and intact without rashes, acute lesions, or ulcers on limited exam   9.Musculoskeletal:  No acute deformities or trauma, no asymmetry in tone, no peripheral edema, peripheral pulses palpated, no tenderness to palpation in the extremities  Data Reviewed:   Assessment and Plan: * Epigastric pain - With melena and decreasing hemoglobin - Concern for peptic ulcer disease - Protonix twice daily - N.p.o. - Consult GI - Patient was already typed and screened - Trend hemoglobin every 6 hours - Continue to monitor  Hypoalbuminemia Albumin low at 2.7 - Likely secondary to poor p.o. intake - When patient is able to tolerate food encourage nutrient dense food choices  Acute on chronic anemia - Baseline hemoglobin around 8.5 - Hemoglobin today 7.3 - Melena for 2 days - Trend hemoglobin every 6 hours - N.p.o. - Protonix twice daily - Occult blood with Movement - Consult GI  Chronic respiratory failure with hypoxia (HCC) - Secondary to COPD and some pulmonary fibrosis with chronic changes on chest x-ray - 3 L nasal cannula at baseline - Continue 3 L nasal cannula - Continue Pulmicort - Albuterol as needed - Continue to monitor  Elevated troponin - Troponin 22 - Last troponin was 26, 1 month ago - Repeat troponin 23-flat - Low suspicion for cardiac etiology to chest pain  Chronic combined systolic and diastolic CHF (congestive heart failure) (HCC) - Last echo in March 2023 shows an ejection fraction of 20-25% - Not currently fluid overloaded - Continue beta-blocker, statin, ARB - Troponin slightly elevated at 22, which  is actually downtrending from last troponin - Cardiac etiology of chest/epigastric pain today is low on the differential - Continue to monitor  GERD (gastroesophageal reflux disease) - Holding p.o. Protonix - Twice daily IV Protonix  Essential hypertension Continue metoprolol, valsartan  Chest pain - Different than his normal chest pain - Not likely to be cardiac related with flat troponin - T wave inversions are present on EKG, but no acute ST segment changes - Echo in the a.m. - Monitor on telemetry - EKG for  any further chest pain -      Advance Care Planning:   Code Status: Full Code   Consults: gastro  Family Communication: daughter at bedside  Severity of Illness: The appropriate patient status for this patient is OBSERVATION. Observation status is judged to be reasonable and necessary in order to provide the required intensity of service to ensure the patient's safety. The patient's presenting symptoms, physical exam findings, and initial radiographic and laboratory data in the context of their medical condition is felt to place them at decreased risk for further clinical deterioration. Furthermore, it is anticipated that the patient will be medically stable for discharge from the hospital within 2 midnights of admission.   Author: Rolla Plate, DO 07/14/2022 10:21 PM  For on call review [email or web address].

## 2022-07-14 NOTE — Assessment & Plan Note (Addendum)
-   Baseline hemoglobin around 8.5 - Hemoglobin was down to 7.3 - Melena for 2 days per pt reporting prior to arrival - Follow CBC.  Hg 8 today - N.p.o. pending GI procedures - Protonix twice daily - hemoccult stools - Consult GI

## 2022-07-14 NOTE — Patient Instructions (Addendum)
Go directly to ER / ER nurse is Nira Conn knows you are coming   Bring in all inhalers and prednisone prescriptions to be sure you have what you need8

## 2022-07-14 NOTE — Assessment & Plan Note (Signed)
CT 04/14/22 There is marked severity bilateral maxillary sinus and bilateral ethmoid sinus mucosal thickening. - referred to ENT 05/06/2022 >>>  07/12/22 VC atrophy/ gerd likely   Rec continue max gerd rx/ f/u GI planned

## 2022-07-15 ENCOUNTER — Observation Stay (HOSPITAL_BASED_OUTPATIENT_CLINIC_OR_DEPARTMENT_OTHER): Payer: Medicare HMO

## 2022-07-15 DIAGNOSIS — I10 Essential (primary) hypertension: Secondary | ICD-10-CM | POA: Diagnosis not present

## 2022-07-15 DIAGNOSIS — D649 Anemia, unspecified: Secondary | ICD-10-CM | POA: Diagnosis present

## 2022-07-15 DIAGNOSIS — R1013 Epigastric pain: Secondary | ICD-10-CM | POA: Diagnosis not present

## 2022-07-15 DIAGNOSIS — R0609 Other forms of dyspnea: Secondary | ICD-10-CM

## 2022-07-15 DIAGNOSIS — R1033 Periumbilical pain: Secondary | ICD-10-CM | POA: Diagnosis not present

## 2022-07-15 DIAGNOSIS — K5903 Drug induced constipation: Secondary | ICD-10-CM

## 2022-07-15 DIAGNOSIS — T402X5A Adverse effect of other opioids, initial encounter: Secondary | ICD-10-CM | POA: Diagnosis present

## 2022-07-15 LAB — ECHOCARDIOGRAM COMPLETE
AR max vel: 1.78 cm2
AV Area VTI: 1.72 cm2
AV Area mean vel: 1.66 cm2
AV Mean grad: 3.5 mmHg
AV Peak grad: 6.2 mmHg
Ao pk vel: 1.24 m/s
Area-P 1/2: 5.38 cm2
Calc EF: 26.5 %
Height: 66 in
S' Lateral: 6.1 cm
Single Plane A2C EF: 25.1 %
Single Plane A4C EF: 28.1 %
Weight: 2128 oz

## 2022-07-15 LAB — CBC WITH DIFFERENTIAL/PLATELET
Abs Immature Granulocytes: 0.01 10*3/uL (ref 0.00–0.07)
Basophils Absolute: 0 10*3/uL (ref 0.0–0.1)
Basophils Relative: 0 %
Eosinophils Absolute: 0.3 10*3/uL (ref 0.0–0.5)
Eosinophils Relative: 4 %
HCT: 26.1 % — ABNORMAL LOW (ref 39.0–52.0)
Hemoglobin: 7.5 g/dL — ABNORMAL LOW (ref 13.0–17.0)
Immature Granulocytes: 0 %
Lymphocytes Relative: 24 %
Lymphs Abs: 1.6 10*3/uL (ref 0.7–4.0)
MCH: 26.7 pg (ref 26.0–34.0)
MCHC: 28.7 g/dL — ABNORMAL LOW (ref 30.0–36.0)
MCV: 92.9 fL (ref 80.0–100.0)
Monocytes Absolute: 0.7 10*3/uL (ref 0.1–1.0)
Monocytes Relative: 10 %
Neutro Abs: 4.2 10*3/uL (ref 1.7–7.7)
Neutrophils Relative %: 62 %
Platelets: 211 10*3/uL (ref 150–400)
RBC: 2.81 MIL/uL — ABNORMAL LOW (ref 4.22–5.81)
RDW: 15 % (ref 11.5–15.5)
WBC: 6.8 10*3/uL (ref 4.0–10.5)
nRBC: 0 % (ref 0.0–0.2)

## 2022-07-15 LAB — COMPREHENSIVE METABOLIC PANEL
ALT: 10 U/L (ref 0–44)
AST: 15 U/L (ref 15–41)
Albumin: 2.5 g/dL — ABNORMAL LOW (ref 3.5–5.0)
Alkaline Phosphatase: 65 U/L (ref 38–126)
Anion gap: 3 — ABNORMAL LOW (ref 5–15)
BUN: 11 mg/dL (ref 8–23)
CO2: 26 mmol/L (ref 22–32)
Calcium: 8 mg/dL — ABNORMAL LOW (ref 8.9–10.3)
Chloride: 110 mmol/L (ref 98–111)
Creatinine, Ser: 1.18 mg/dL (ref 0.61–1.24)
GFR, Estimated: >60 mL/min (ref >60)
Glucose, Bld: 106 mg/dL — ABNORMAL HIGH (ref 70–99)
Potassium: 4.5 mmol/L (ref 3.5–5.1)
Sodium: 139 mmol/L (ref 135–145)
Total Bilirubin: 0.1 mg/dL — ABNORMAL LOW (ref 0.3–1.2)
Total Protein: 5.7 g/dL — ABNORMAL LOW (ref 6.5–8.1)

## 2022-07-15 LAB — MAGNESIUM: Magnesium: 2.2 mg/dL (ref 1.7–2.4)

## 2022-07-15 MED ORDER — POLYETHYLENE GLYCOL 3350 17 G PO PACK
17.0000 g | PACK | Freq: Two times a day (BID) | ORAL | Status: DC
  Administered 2022-07-15 – 2022-07-16 (×3): 17 g via ORAL
  Filled 2022-07-15 (×4): qty 1

## 2022-07-15 MED ORDER — MORPHINE SULFATE (PF) 2 MG/ML IV SOLN
1.0000 mg | INTRAVENOUS | Status: DC | PRN
  Administered 2022-07-15: 1 mg via INTRAVENOUS
  Filled 2022-07-15: qty 1

## 2022-07-15 NOTE — Progress Notes (Signed)
*  PRELIMINARY RESULTS* Echocardiogram 2D Echocardiogram has been performed.  Gregory Mitchell 07/15/2022, 9:23 AM

## 2022-07-15 NOTE — H&P (View-Only) (Signed)
Gastroenterology Consult   Referring Provider: No ref. provider found Primary Care Physician:  Beola Cord, [place] Primary Gastroenterologist:  Elon Alas. Abbey Chatters, DO Patient ID: Gregory Mitchell; [id number]; 1941-05-13   Admit date: 07/14/2022  LOS: 0 days   Date of Consultation: 07/15/2022  Reason for Consultation:  melena, downward trend in Hgb    History of Present Illness   Gregory Mitchell is a 81 y.o. male with complicated past medical history including heart failure with reduced ejection fraction, COPD, chronic respiratory failure, h/o PE, renal insufficiency, chronic dysphagia, recurrent candidal esophagitis, chronic GERD, constipation, iron deficiency anemia presented to the emergency department with chest pain radiating into the upper abdomen.  In the ED: Hemoglobin 7.3, hematocrit 26.3, MCV 94.6.  May 28, 2022, hemoglobin 8.2.  BUN 12, creatinine 1.17.  Troponins mildly elevated in the 22-24 range.  Follow-up hemoglobin yesterday was 8.0.  Hemoglobin this morning 7.5.  Chest x-ray with chronic changes at the left lung base compatible with bronchiectasis.  Patient is a difficult historian at baseline. Reports pain over the past two days. Pain in the chest and abdomen. Has chronic chest pain but does not usually have pain going into the stomach. Chest tightness usually responds to nebulizer. Wears 3L [place] at home. He complains of productive cough. Chest pain worse with sitting down and laying down. Chronic sob and no worsening from baseline. States that his stomach seems to hurt more with eating. Hasn't been able to eat much. Has had some [employer] chicken and donuts since in the ED. No nausea or vomiting. Patient states his stools have been very dark, "somewhat black". Last stool two days ago. Having BM every other day to twice daily. Complains of constipation.  No brbpr. Notes dysphagia is better with last course of diflucan. No odynophagia.   Trying to complete EGD (with plans  for fungal culture and sensitivities)/colonoscopy to further evaluate dysphagia and IDA but have not been able to obtain clearance from pulmonology. Patient felt to be high risk due to multiple comorbidities including his CHF, chronic respiratory failure, COPD.  Most recently seen in the office 06/02/2022 and placed on 3 week course of Diflucan for suspected persistent candida esophagitis. Remeron held while on Diflucan due to QT prolongation.   Last EGD:08/24/21 - Moderately severe candidiasis esophagitis with no bleeding. Cells for cytology obtained, confirmed yeast. - Gastritis.  - Normal duodenal bulb, first portion of the duodenum and second portion of the duodenum.   Flex sig: 08/24/21 - Sacral decubitus ulcer found on perianal exam. - The recto-sigmoid colon, sigmoid colon and descending colon are normal. - No specimens collected.   Colonoscopy March 2008: normal,  no specimens   Prior to Admission medications   Medication Sig Start Date End Date Taking? Authorizing Provider  albuterol (PROVENTIL) (2.5 MG/3ML) 0.083% nebulizer solution Take 3 mLs (2.5 mg total) by nebulization every 4 (four) hours as needed for wheezing or shortness of breath. 04/16/22 04/16/23 Yes Emokpae, Courage, MD  albuterol (VENTOLIN HFA) 108 (90 Base) MCG/ACT inhaler Inhale into the lungs. 07/13/22  Yes [provider]  budesonide (PULMICORT) 0.25 MG/2ML nebulizer solution Take 2 mLs (0.25 mg total) by nebulization 2 (two) times daily. 01/28/22  Yes Tanda Rockers, MD  dapagliflozin propanediol (FARXIGA) 10 MG TABS tablet Take 1 tablet (10 mg total) by mouth daily before breakfast. 02/25/22  Yes Branch, Alphonse Guild, MD  guaiFENesin (MUCINEX) 600 MG 12 hr tablet Take 1 tablet (600 mg total) by mouth 2 (  two) times daily. 04/16/22 04/16/23 Yes Emokpae, Courage, MD  HM SENNA-S 8.6-50 MG tablet Take 2 tablets by mouth daily. 07/10/21  Yes [provider]  HYDROcodone-acetaminophen (NORCO) 7.5-325 MG tablet Take 1  tablet by mouth 2 (two) times daily as needed for moderate pain. 11/27/20  Yes [provider]  ipratropium-albuterol (DUONEB) 0.5-2.5 (3) MG/3ML SOLN Take 3 mLs by nebulization 4 (four) times daily. 06/30/22  Yes [provider]  metoprolol succinate (TOPROL XL) 25 MG 24 hr tablet Take 1 tablet (25 mg total) by mouth daily. 12/18/21  Yes Strader, [place] M, PA-C  mirtazapine (REMERON) 15 MG tablet Take 15 mg by mouth daily. 07/07/22  Yes [provider]  OXYGEN Inhale 3 L into the lungs daily as needed (for shortness of breath).   Yes [provider]  pantoprazole (PROTONIX) 40 MG tablet Take 1 tablet (40 mg total) by mouth daily. 04/16/22  Yes Emokpae, Courage, MD  tamsulosin (FLOMAX) 0.4 MG CAPS capsule Take 0.8 mg by mouth 2 (two) times daily.   Yes [provider]  torsemide (DEMADEX) 20 MG tablet Take 1 tablet (20 mg total) by mouth as needed (swelling). 03/23/22  Yes Arnoldo Lenis, MD  valsartan (DIOVAN) 80 MG tablet Take 80 mg by mouth daily. 07/13/22  Yes [provider]  atorvastatin (LIPITOR) 80 MG tablet Take 80 mg by mouth every evening. Patient not taking: Reported on 07/14/2022 04/10/22   [provider]  lactulose (CHRONULAC) 10 GM/15ML solution Take 30 mLs (20 g total) by mouth 3 (three) times daily as needed for mild constipation. Patient not taking: Reported on 07/14/2022 03/13/22   Mahala Menghini, PA-C    Current Facility-Administered Medications  Medication Dose Route Frequency Provider Last Rate Last Admin   acetaminophen (TYLENOL) tablet 650 mg  650 mg Oral Q6H PRN Zierle-Ghosh, Asia B, DO       Or   acetaminophen (TYLENOL) suppository 650 mg  650 mg Rectal Q6H PRN Zierle-Ghosh, Asia B, DO       albuterol (PROVENTIL) (2.5 MG/3ML) 0.083% nebulizer solution 2.5 mg  2.5 mg Nebulization Q4H PRN Zierle-Ghosh, Asia B, DO   2.5 mg at 07/14/22 2233   atorvastatin (LIPITOR) tablet 80 mg  80 mg Oral QPM Zierle-Ghosh, Asia B,  DO   80 mg at 07/14/22 2230   budesonide (PULMICORT) nebulizer solution 0.25 mg  0.25 mg Nebulization BID Zierle-Ghosh, Asia B, DO   0.25 mg at 07/14/22 2233   irbesartan (AVAPRO) tablet 75 mg  75 mg Oral Daily Zierle-Ghosh, Asia B, DO       metoprolol succinate (TOPROL-XL) 24 hr tablet 25 mg  25 mg Oral Daily Zierle-Ghosh, Asia B, DO       mirtazapine (REMERON) tablet 15 mg  15 mg Oral QHS Zierle-Ghosh, Asia B, DO   15 mg at 07/14/22 2230   morphine (PF) 2 MG/ML injection 2 mg  2 mg Intravenous Q2H PRN Zierle-Ghosh, Asia B, DO   2 mg at 07/15/22 0209   oxyCODONE (Oxy IR/ROXICODONE) immediate release tablet 5 mg  5 mg Oral Q4H PRN Zierle-Ghosh, Asia B, DO   5 mg at 07/15/22 0344   pantoprazole (PROTONIX) injection 40 mg  40 mg Intravenous Q12H Zierle-Ghosh, Asia B, DO   40 mg at 07/14/22 2229   Current Outpatient Medications  Medication Sig Dispense Refill   albuterol (PROVENTIL) (2.5 MG/3ML) 0.083% nebulizer solution Take 3 mLs (2.5 mg total) by nebulization every 4 (four) hours as needed for  wheezing or shortness of breath. 75 mL 2   albuterol (VENTOLIN HFA) 108 (90 Base) MCG/ACT inhaler Inhale into the lungs.     budesonide (PULMICORT) 0.25 MG/2ML nebulizer solution Take 2 mLs (0.25 mg total) by nebulization 2 (two) times daily. 60 mL 12   dapagliflozin propanediol (FARXIGA) 10 MG TABS tablet Take 1 tablet (10 mg total) by mouth daily before breakfast. 100 tablet 3   guaiFENesin (MUCINEX) 600 MG 12 hr tablet Take 1 tablet (600 mg total) by mouth 2 (two) times daily. 60 tablet 2   HM SENNA-S 8.6-50 MG tablet Take 2 tablets by mouth daily.     HYDROcodone-acetaminophen (NORCO) 7.5-325 MG tablet Take 1 tablet by mouth 2 (two) times daily as needed for moderate pain.     ipratropium-albuterol (DUONEB) 0.5-2.5 (3) MG/3ML SOLN Take 3 mLs by nebulization 4 (four) times daily.     metoprolol succinate (TOPROL XL) 25 MG 24 hr tablet Take 1 tablet (25 mg total) by mouth daily. 90 tablet 3   mirtazapine  (REMERON) 15 MG tablet Take 15 mg by mouth daily.     OXYGEN Inhale 3 L into the lungs daily as needed (for shortness of breath).     pantoprazole (PROTONIX) 40 MG tablet Take 1 tablet (40 mg total) by mouth daily. 60 tablet 3   tamsulosin (FLOMAX) 0.4 MG CAPS capsule Take 0.8 mg by mouth 2 (two) times daily.     torsemide (DEMADEX) 20 MG tablet Take 1 tablet (20 mg total) by mouth as needed (swelling).     valsartan (DIOVAN) 80 MG tablet Take 80 mg by mouth daily.     atorvastatin (LIPITOR) 80 MG tablet Take 80 mg by mouth every evening. (Patient not taking: Reported on 07/14/2022)     lactulose (CHRONULAC) 10 GM/15ML solution Take 30 mLs (20 g total) by mouth 3 (three) times daily as needed for mild constipation. (Patient not taking: Reported on 07/14/2022) 1800 mL 3    Allergies as of 07/14/2022   (No Known Allergies)    Past Medical History:  Diagnosis Date   Asthma    Chronic renal insufficiency    COPD (chronic obstructive pulmonary disease) (HCC)    Heart failure ([place])    a. EF 25-30% in 08/2019 with cath showing normal cors b. EF 20-25% in 09/2020 c. 30-35% in 02/2021 d. EF 20-25% in 10/2021   Hypertension    Pulmonary emboli ([place]) 03/2019   Renal insufficiency    Type 2 diabetes mellitus ([place])     Past Surgical History:  Procedure Laterality Date   APPENDECTOMY     BALLOON DILATION N/A 03/18/2020   Procedure: BALLOON DILATION;  Surgeon: Eloise Harman, DO;  [facility];  Service: Endoscopy;  Laterality: N/A;   CHOLECYSTECTOMY     COLONOSCOPY  10/2006   Dr.Anwar:normal   ESOPHAGEAL BRUSHING  08/24/2021   Procedure: ESOPHAGEAL BRUSHING;  Surgeon: Eloise Harman, DO;  Location: [facility];  Service: Endoscopy;;   ESOPHAGOGASTRODUODENOSCOPY (EGD) WITH PROPOFOL N/A 03/18/2020   Procedure: ESOPHAGOGASTRODUODENOSCOPY (EGD) WITH PROPOFOL;  Surgeon: Eloise Harman, DO;  [facility];  Service: Endoscopy;  Laterality: N/A;  2:30pm    ESOPHAGOGASTRODUODENOSCOPY (EGD) WITH PROPOFOL N/A 08/24/2021   Procedure: ESOPHAGOGASTRODUODENOSCOPY (EGD) WITH PROPOFOL;  Surgeon: Eloise Harman, DO;  [facility];  Service: Endoscopy;  Laterality: N/A;  with dilation   FLEXIBLE SIGMOIDOSCOPY N/A 08/24/2021   Procedure: FLEXIBLE SIGMOIDOSCOPY;  Surgeon: Eloise Harman, DO;  [facility];  Service: Endoscopy;  Laterality: N/A;   NASAL SINUS SURGERY     NEPHRECTOMY     RIGHT (Question CA.  No further therapy needed.)   RIGHT/LEFT HEART CATH AND CORONARY ANGIOGRAPHY N/A 09/03/2019   Procedure: RIGHT/LEFT HEART CATH AND CORONARY ANGIOGRAPHY;  Surgeon: Lorretta Harp, MD;  Location: [facility];  Service: Cardiovascular;  Laterality: N/A;    Family History  Problem Relation Age of Onset   Stroke Mother    Breast cancer Mother    Lung cancer Brother    Colon cancer Neg Hx     Social History   Socioeconomic History   Marital status: Widowed    Spouse name: Not on file   Number of children: Not on file   Years of education: Not on file   Highest education level: Not on file  Occupational History   Not on file  Tobacco Use   Smoking status: Former    Packs/day: 1.00    Years: 15.00    Total pack years: 15.00    Types: Cigarettes    Start date: 07/14/1957    Quit date: 08/09/1976    Years since quitting: 45.9   Smokeless tobacco: Former    Types: Chew    Quit date: 02/10/2006   Tobacco comments:    chewed tobacco for 10 years  Vaping Use   Vaping Use: Never used  Substance and Sexual Activity   Alcohol use: Not Currently    Alcohol/week: 0.0 standard drinks of alcohol    Comment: no etoh since he was in his 5s   Drug use: Never   Sexual activity: Yes  Other Topics Concern   Not on file  Social History Narrative   Lives with wife.  Three children.     Social Determinants of Health   Financial Resource Strain: Not on file  Food Insecurity: No Food Insecurity (04/15/2022)   Hunger Vital  Sign    Worried About Running Out of Food in the Last Year: Never true    Ran Out of Food in the Last Year: Never true  Transportation Needs: Not on file  Physical Activity: Not on file  Stress: Not on file  Social Connections: Not on file  Intimate Partner Violence: Not on file     Review of System:   General: Negative for  weight loss, fever, chills, fatigue, ++weakness. Eyes: Negative for vision changes.  ENT: Negative for hoarseness, difficulty swallowing , nasal congestion. CV: Negative for  angina, palpitations, dyspnea on exertion, peripheral edema. See hpi Respiratory: Negative for dyspnea at rest,  cough, sputum, wheezing. +DOE GI: See history of present illness. GU:  Negative for dysuria, hematuria, urinary incontinence, urinary frequency, nocturnal urination.  MS: Negative for joint pain, low back pain.  Derm: Negative for rash or itching.  Neuro: Negative for weakness, abnormal sensation, seizure, frequent headaches, memory loss, confusion.  Psych: Negative for anxiety, depression, suicidal ideation, hallucinations.  Endo: Negative for unusual weight change.  Heme: Negative for bruising or bleeding. Allergy: Negative for rash or hives.      Physical Examination:   Vital signs in last 24 hours: Temp:  [98.2 F (36.8 C)-99.5 F (37.5 C)] 99.5 F (37.5 C) (12/07 0600) Pulse Rate:  [83-112] 94 (12/07 0500) Resp:  [16-28] 20 (12/07 0600) BP: (115-136)/(67-113) 125/113 (12/07 0600) SpO2:  [94 %-99 %] 98 % (12/07 0500) Weight:  [60.3 kg-60.9 kg] 60.3 kg (12/06 1738) Last BM Date : 07/14/22  General: Well-nourished, well-developed in no acute distress.  Head: Normocephalic, atraumatic.   Eyes: Conjunctiva pink, no icterus. Mouth: Oropharyngeal mucosa moist and pink , no lesions erythema or exudate. Neck: Supple without thyromegaly, masses, or lymphadenopathy.  Lungs: Clear to auscultation bilaterally.  Heart: Regular rate and rhythm, no murmurs rubs or gallops.   Abdomen: Bowel sounds are normal,  nondistended, no hepatosplenomegaly or masses, no abdominal bruits or hernia , no rebound or guarding.  Mild epig tenderness and periumbilical tenderness Rectal: not performed Extremities: No lower extremity edema, clubbing, deformity.  Neuro: Alert and oriented x 4 , grossly normal neurologically.  Skin: Warm and dry, no rash or jaundice.   Psych: Alert and cooperative, normal mood and affect.        Intake/Output from previous day: No intake/output data recorded. Intake/Output this shift: No intake/output data recorded.  Lab Results:   CBC Recent Labs    07/14/22 1809 07/14/22 2255 07/15/22 0459  WBC 4.2 4.7 6.8  HGB 7.3* 8.0* 7.5*  HCT 26.3* 27.2* 26.1*  MCV 94.6 92.5 92.9  PLT 208 223 211   BMET Recent Labs    07/14/22 1809 07/15/22 0459  NA 140 139  K 4.9 4.5  CL 107 110  CO2 26 26  GLUCOSE 104* 106*  BUN 12 11  CREATININE 1.17 1.18  CALCIUM 8.2* 8.0*   LFT Recent Labs    07/14/22 1809 07/15/22 0459  BILITOT 0.3 0.1*  ALKPHOS 59 65  AST 17 15  ALT 9 10  PROT 6.2* 5.7*  ALBUMIN 2.7* 2.5*    Lipase Recent Labs    07/14/22 1809  LIPASE 25   Lab Results  Component Value Date   IRON 38 01/01/2022   TIBC 290 01/01/2022   FERRITIN 99 01/01/2022   Lab Results  Component Value Date   VITAMINB12 274 09/22/2021   Lab Results  Component Value Date   FOLATE 7.6 09/22/2021     PT/INR No results for input(s): "LABPROT", "INR" in the last 72 hours.   Hepatitis Panel No results for input(s): "HEPBSAG", "HCVAB", "HEPAIGM", "HEPBIGM" in the last 72 hours.   Imaging Studies:   DG Chest Portable 1 View  Result Date: 07/14/2022 CLINICAL DATA:  Mid sternal chest pain. EXAM: PORTABLE CHEST 1 VIEW COMPARISON:  05/28/2022 and abdominal CT 05/28/2022 FINDINGS: Chronic densities in the left lung base are compatible with bronchiectasis from previous CT. Remainder of the lungs are clear without new airspace disease or  pulmonary edema. Heart size is upper limits of normal but stable. Atherosclerotic calcifications at the aortic arch. Negative for a pneumothorax. IMPRESSION: Chronic changes at the left lung base. No acute findings. Electronically Signed   By: Markus Daft M.D.   On: 07/14/2022 18:10  [4 week]  Assessment:  81 y/o male with complicated past medical history including heart failure with reduced ejection fraction, COPD, chronic respiratory failure, h/o PE, renal insufficiency, chronic dysphagia, recurrent candidal esophagitis, chronic GERD, constipation, iron deficiency anemia presented to the emergency department with chest pain radiating into the upper abdomen. GI consulted for melena, and decline in HGB.  Anemia: On presentation hemoglobin 7.3-->8-->7.5.  In October hemoglobin 8.2.  Over the past 6 months his hemoglobin has been in the 8-9 range.  Patient states his stools have been dark, potentially black over the past few days but states his last bowel movement was 2 days ago.  Denies brbpr.  Complains of fatigue.  Presents with chest pain which radiates into the epigastric region.  Chronically on PPI.  Last EGD, flex sig as outlined above.  Outpatient workup has been delayed pending pulmonology clearance.  Abdominal pain: On exam he has both epigastric and periumbilical pain which is mild.  Patient difficult historian.  Believes he has a postprandial component.  He complains of constipation.  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  Mild diffuse thickened appearance of the colon likely due to underdistention.  Suspect his abdominal pain is multifactorial.   Plan:   Recommend IV PPI twice daily. Ideally would consider EGD while inpatient if felt to be medically stable and appropriate. Monitor H&H, transfuse if needed. Monitor abdominal pain,  if progressive, consider repeat CT imaging.   LOS: 0 days   We would like to thank you for the opportunity to participate in the care of [place].  Laureen Ochs. Bernarda Caffey [facility] [phone] 12/7/20237:10 AM

## 2022-07-15 NOTE — Progress Notes (Signed)
PROGRESS NOTE   Gregory Mitchell  XLK:[id number] DOB: [birth year] DOA: 07/14/2022 PCP: Beola Cord, FNP   Chief Complaint  Patient presents with   Chest Pain   Level of care: Med-Surg  Brief Admission History:  81 y.o. male with medical history significant of asthma, chronic renal insufficiency, COPD, hypertension, history of PE no longer on anticoagulation, and more presents to ED with a chief complaint of chest pain and epigastric pain.  Patient has had chronic chest pain, but reports that this is different because it goes down into his abdomen. Patient reports that it all started with chest tightness. The tightness was intermittent. Using his nebulizer seemed to improve the pain. Sitting down or laying down seemed to make it worse, per the patient. He does wear 3L nasal cannula at home. He noticed himself wheezing, but that was also improved with neb treatments. He has had a productive cough with yellow sputum. He believes his cough is not much different than normal. Patient reports that his epigastric pain feels like a cramping. It is also intermittent. The cramping is worse when he has increased work of breathing. Eating makes his pain worse as well, which has affected his appetite. His last normal meal was 12/5. Patient denies nausea and vomiting. He reports that he has had 2 melenotic stools daily for a "few" days. He has been on anticoagulation in the past - eliquis - but he is not on it any longer. Daughter at bedside thinks he was taken off of it because of GI bleed. Patient has had to be transfused in the past, and is willing to be transfused again. When he last had to be transfused his only symptom was cold intolerance per the daughter. He did not have chest pain at that time. Patient has no other complaints at this time.    Assessment and Plan: * Epigastric pain - With melena and decreasing hemoglobin - Concern for peptic ulcer disease - Protonix twice daily - N.p.o. - Consult  GI - Patient was already typed and screened - Trend hemoglobin every 6 hours - Continue to monitor  Hypoalbuminemia Albumin low at 2.7 - Likely secondary to poor p.o. intake - When patient is able to tolerate food encourage nutrient dense food choices  Acute on chronic anemia - Baseline hemoglobin around 8.5 - Hemoglobin down to 7.3 - Melena for 2 days per pt reporting - Follow CBC - N.p.o. pending GI consultation - Protonix twice daily - hemoccult stools - Consult GI  Chronic respiratory failure with hypoxia (HCC) - Secondary to COPD and some pulmonary fibrosis with chronic changes on chest x-ray - 3 L nasal cannula at baseline to wear continuously; he saw Dr. Melvyn Novas on 07/14/22 in pulmonary office  - Continue 3 L nasal cannula - Continue Pulmicort - Albuterol as needed - Continue to monitor  Elevated troponin - Troponin 22 - Last troponin was 26, 1 month ago - Repeat troponin 23-flat - Low suspicion for cardiac etiology to chest pain  Chronic combined systolic and diastolic CHF (congestive heart failure) (HCC) - Last echo in March 2023 shows an ejection fraction of 20-25% - appears euvolemic at this time - Continue beta-blocker, statin, ARB - Troponin slightly elevated at 22, which is actually downtrending from last troponin - Cardiac etiology of chest/epigastric pain today is low on the differential - Continue to monitor  GERD (gastroesophageal reflux disease) - Holding p.o. Protonix - Twice daily IV Protonix  Essential hypertension Continue metoprolol, valsartan  Chest pain -  Different than his normal chronic chest pain - Not likely to be cardiac related with flat troponin - no acute ST segment changes - Echo pending  - Monitor on telemetry   DVT prophylaxis: SCDs Code Status: full  Family Communication:  Disposition: Status is: Observation   Consultants:  GI  Procedures:   Antimicrobials:    Subjective: Pt reports ongoing abdominal pain and  constipation.   Objective: Vitals:   07/15/22 0825 07/15/22 0830 07/15/22 0845 07/15/22 0915  BP:    (!) 127/98  Pulse: 97 91 65 87  Resp: (!) 21 (!) 24 (!) 25 (!) 25  Temp:      TempSrc:      SpO2: 100% 100% 100% 100%  Weight:      Height:       No intake or output data in the 24 hours ending 07/15/22 0948 Filed Weights   07/14/22 1738  Weight: 60.3 kg   Examination:  General exam: Appears calm and comfortable  Respiratory system: Clear to auscultation. Respiratory effort normal. Cardiovascular system: normal S1 & S2 heard. No JVD, murmurs, rubs, gallops or clicks. No pedal edema. Gastrointestinal system: Abdomen is nondistended very tender in LLQ. No organomegaly or masses felt. Normal bowel sounds heard. Central nervous system: Alert and oriented. No focal neurological deficits. Extremities: Symmetric 5 x 5 power. Skin: No rashes, lesions or ulcers. Psychiatry: Judgement and insight appear normal. Mood & affect appropriate.   Data Reviewed: I have personally reviewed following labs and imaging studies  CBC: Recent Labs  Lab 07/14/22 1809 07/14/22 2255 07/15/22 0459  WBC 4.2 4.7 6.8  NEUTROABS  --   --  4.2  HGB 7.3* 8.0* 7.5*  HCT 26.3* 27.2* 26.1*  MCV 94.6 92.5 92.9  PLT 208 223 628    Basic Metabolic Panel: Recent Labs  Lab 07/14/22 1809 07/15/22 0459  NA 140 139  K 4.9 4.5  CL 107 110  CO2 26 26  GLUCOSE 104* 106*  BUN 12 11  CREATININE 1.17 1.18  CALCIUM 8.2* 8.0*  MG  --  2.2    CBG: Recent Labs  Lab 07/14/22 1821  GLUCAP 102*    No results found for this or any previous visit (from the past 240 hour(s)).   Radiology Studies: DG Chest Portable 1 View  Result Date: 07/14/2022 CLINICAL DATA:  Mid sternal chest pain. EXAM: PORTABLE CHEST 1 VIEW COMPARISON:  05/28/2022 and abdominal CT 05/28/2022 FINDINGS: Chronic densities in the left lung base are compatible with bronchiectasis from previous CT. Remainder of the lungs are clear without  new airspace disease or pulmonary edema. Heart size is upper limits of normal but stable. Atherosclerotic calcifications at the aortic arch. Negative for a pneumothorax. IMPRESSION: Chronic changes at the left lung base. No acute findings. Electronically Signed   By: Markus Daft M.D.   On: 07/14/2022 18:10    Scheduled Meds:  atorvastatin  80 mg Oral QPM   budesonide  0.25 mg Nebulization BID   irbesartan  75 mg Oral Daily   metoprolol succinate  25 mg Oral Daily   mirtazapine  15 mg Oral QHS   pantoprazole (PROTONIX) IV  40 mg Intravenous Q12H   polyethylene glycol  17 g Oral BID   Continuous Infusions:   LOS: 0 days   Time spent: 35 mins  Giorgio Chabot Wynetta Emery, MD How to contact the [facility] Attending or Consulting provider [place] or covering provider during after hours [place], for this patient?  Check  the care team in [facility] and look for a) attending/consulting [place] provider listed and b) the [facility] team listed Log into [email or web address] and use [place] universal password to access. If you do not have the password, please contact the hospital operator. Locate the [facility] provider you are looking for under [facility] and page to a number that you can be directly reached. If you still have difficulty reaching the provider, please page the [facility] (Director on Call) for the Hospitalists listed on amion for assistance.  07/15/2022, 9:48 AM

## 2022-07-15 NOTE — Care Management Obs Status (Signed)
[place] NOTIFICATION   Patient Details  Name: Gregory Mitchell MRN: [id number] Date of Birth: 01-02-1941   Medicare Observation Status Notification Given:  Yes    Tommy Medal 07/15/2022, 4:34 PM

## 2022-07-15 NOTE — Care Management Obs Status (Signed)
[place] NOTIFICATION   Patient Details  Name: Gregory Mitchell MRN: [id number] Date of Birth: 11/22/1940   Medicare Observation Status Notification Given:       Tommy Medal 07/15/2022, 4:33 PM

## 2022-07-15 NOTE — Hospital Course (Signed)
81 y.o. male with medical history significant of asthma, chronic renal insufficiency, COPD, hypertension, history of PE no longer on anticoagulation, and more presents to ED with a chief complaint of chest pain and epigastric pain.  Patient has had chronic chest pain, but reports that this is different because it goes down into his abdomen. Patient reports that it all started with chest tightness. The tightness was intermittent. Using his nebulizer seemed to improve the pain. Sitting down or laying down seemed to make it worse, per the patient. He does wear 3L nasal cannula at home. He noticed himself wheezing, but that was also improved with neb treatments. He has had a productive cough with yellow sputum. He believes his cough is not much different than normal. Patient reports that his epigastric pain feels like a cramping. It is also intermittent. The cramping is worse when he has increased work of breathing. Eating makes his pain worse as well, which has affected his appetite. His last normal meal was 12/5. Patient denies nausea and vomiting. He reports that he has had 2 melenotic stools daily for a "few" days. He has been on anticoagulation in the past - eliquis - but he is not on it any longer. Daughter at bedside thinks he was taken off of it because of GI bleed. Patient has had to be transfused in the past, and is willing to be transfused again. When he last had to be transfused his only symptom was cold intolerance per the daughter. He did not have chest pain at that time. Patient has no other complaints at this time.

## 2022-07-15 NOTE — ED Notes (Signed)
Patient's daughter updated at this time

## 2022-07-15 NOTE — TOC Progression Note (Signed)
  Transition of Care [facility]) Screening Note   Patient Details  Name: Gregory Mitchell Date of Birth: [birth year]   Transition of Care [facility]) CM/SW Contact:    Shade Flood, LCSW Phone Number: 07/15/2022, 10:45 AM    Transition of Care Department [facility]) has reviewed patient and no TOC needs have been identified at this time. We will continue to monitor patient advancement through interdisciplinary progression rounds. If new patient transition needs arise, please place a TOC consult.

## 2022-07-15 NOTE — Consult Note (Signed)
Gastroenterology Consult   Referring Provider: No ref. provider found Primary Care Physician:  Beola Cord, [place] Primary Gastroenterologist:  Elon Alas. Abbey Chatters, DO Patient ID: Gregory Mitchell; [id number]; 02-20-1941   Admit date: 07/14/2022  LOS: 0 days   Date of Consultation: 07/15/2022  Reason for Consultation:  melena, downward trend in Hgb    History of Present Illness   Gregory Mitchell is a 81 y.o. male with complicated past medical history including heart failure with reduced ejection fraction, COPD, chronic respiratory failure, h/o PE, renal insufficiency, chronic dysphagia, recurrent candidal esophagitis, chronic GERD, constipation, iron deficiency anemia presented to the emergency department with chest pain radiating into the upper abdomen.  In the ED: Hemoglobin 7.3, hematocrit 26.3, MCV 94.6.  May 28, 2022, hemoglobin 8.2.  BUN 12, creatinine 1.17.  Troponins mildly elevated in the 22-24 range.  Follow-up hemoglobin yesterday was 8.0.  Hemoglobin this morning 7.5.  Chest x-ray with chronic changes at the left lung base compatible with bronchiectasis.  Patient is a difficult historian at baseline. Reports pain over the past two days. Pain in the chest and abdomen. Has chronic chest pain but does not usually have pain going into the stomach. Chest tightness usually responds to nebulizer. Wears 3L [place] at home. He complains of productive cough. Chest pain worse with sitting down and laying down. Chronic sob and no worsening from baseline. States that his stomach seems to hurt more with eating. Hasn't been able to eat much. Has had some bojangles chicken and donuts since in the ED. No nausea or vomiting. Patient states his stools have been very dark, "somewhat black". Last stool two days ago. Having BM every other day to twice daily. Complains of constipation.  No brbpr. Notes dysphagia is better with last course of diflucan. No odynophagia.   Trying to complete EGD (with plans  for fungal culture and sensitivities)/colonoscopy to further evaluate dysphagia and IDA but have not been able to obtain clearance from pulmonology. Patient felt to be high risk due to multiple comorbidities including his CHF, chronic respiratory failure, COPD.  Most recently seen in the office 06/02/2022 and placed on 3 week course of Diflucan for suspected persistent candida esophagitis. Remeron held while on Diflucan due to QT prolongation.   Last EGD:08/24/21 - Moderately severe candidiasis esophagitis with no bleeding. Cells for cytology obtained, confirmed yeast. - Gastritis.  - Normal duodenal bulb, first portion of the duodenum and second portion of the duodenum.   Flex sig: 08/24/21 - Sacral decubitus ulcer found on perianal exam. - The recto-sigmoid colon, sigmoid colon and descending colon are normal. - No specimens collected.   Colonoscopy March 2008: normal,  no specimens   Prior to Admission medications   Medication Sig Start Date End Date Taking? Authorizing Provider  albuterol (PROVENTIL) (2.5 MG/3ML) 0.083% nebulizer solution Take 3 mLs (2.5 mg total) by nebulization every 4 (four) hours as needed for wheezing or shortness of breath. 04/16/22 04/16/23 Yes Emokpae, Courage, MD  albuterol (VENTOLIN HFA) 108 (90 Base) MCG/ACT inhaler Inhale into the lungs. 07/13/22  Yes [provider]  budesonide (PULMICORT) 0.25 MG/2ML nebulizer solution Take 2 mLs (0.25 mg total) by nebulization 2 (two) times daily. 01/28/22  Yes Tanda Rockers, MD  dapagliflozin propanediol (FARXIGA) 10 MG TABS tablet Take 1 tablet (10 mg total) by mouth daily before breakfast. 02/25/22  Yes Branch, Alphonse Guild, MD  guaiFENesin (MUCINEX) 600 MG 12 hr tablet Take 1 tablet (600 mg total) by mouth 2 (  two) times daily. 04/16/22 04/16/23 Yes Emokpae, Courage, MD  HM SENNA-S 8.6-50 MG tablet Take 2 tablets by mouth daily. 07/10/21  Yes [provider]  HYDROcodone-acetaminophen (NORCO) 7.5-325 MG tablet Take 1  tablet by mouth 2 (two) times daily as needed for moderate pain. 11/27/20  Yes [provider]  ipratropium-albuterol (DUONEB) 0.5-2.5 (3) MG/3ML SOLN Take 3 mLs by nebulization 4 (four) times daily. 06/30/22  Yes [provider]  metoprolol succinate (TOPROL XL) 25 MG 24 hr tablet Take 1 tablet (25 mg total) by mouth daily. 12/18/21  Yes Strader, [place] M, PA-C  mirtazapine (REMERON) 15 MG tablet Take 15 mg by mouth daily. 07/07/22  Yes [provider]  OXYGEN Inhale 3 L into the lungs daily as needed (for shortness of breath).   Yes [provider]  pantoprazole (PROTONIX) 40 MG tablet Take 1 tablet (40 mg total) by mouth daily. 04/16/22  Yes Emokpae, Courage, MD  tamsulosin (FLOMAX) 0.4 MG CAPS capsule Take 0.8 mg by mouth 2 (two) times daily.   Yes [provider]  torsemide (DEMADEX) 20 MG tablet Take 1 tablet (20 mg total) by mouth as needed (swelling). 03/23/22  Yes Arnoldo Lenis, MD  valsartan (DIOVAN) 80 MG tablet Take 80 mg by mouth daily. 07/13/22  Yes [provider]  atorvastatin (LIPITOR) 80 MG tablet Take 80 mg by mouth every evening. Patient not taking: Reported on 07/14/2022 04/10/22   [provider]  lactulose (CHRONULAC) 10 GM/15ML solution Take 30 mLs (20 g total) by mouth 3 (three) times daily as needed for mild constipation. Patient not taking: Reported on 07/14/2022 03/13/22   Mahala Menghini, PA-C    Current Facility-Administered Medications  Medication Dose Route Frequency Provider Last Rate Last Admin   acetaminophen (TYLENOL) tablet 650 mg  650 mg Oral Q6H PRN Zierle-Ghosh, Asia B, DO       Or   acetaminophen (TYLENOL) suppository 650 mg  650 mg Rectal Q6H PRN Zierle-Ghosh, Asia B, DO       albuterol (PROVENTIL) (2.5 MG/3ML) 0.083% nebulizer solution 2.5 mg  2.5 mg Nebulization Q4H PRN Zierle-Ghosh, Asia B, DO   2.5 mg at 07/14/22 2233   atorvastatin (LIPITOR) tablet 80 mg  80 mg Oral QPM Zierle-Ghosh, Asia B,  DO   80 mg at 07/14/22 2230   budesonide (PULMICORT) nebulizer solution 0.25 mg  0.25 mg Nebulization BID Zierle-Ghosh, Asia B, DO   0.25 mg at 07/14/22 2233   irbesartan (AVAPRO) tablet 75 mg  75 mg Oral Daily Zierle-Ghosh, Asia B, DO       metoprolol succinate (TOPROL-XL) 24 hr tablet 25 mg  25 mg Oral Daily Zierle-Ghosh, Asia B, DO       mirtazapine (REMERON) tablet 15 mg  15 mg Oral QHS Zierle-Ghosh, Asia B, DO   15 mg at 07/14/22 2230   morphine (PF) 2 MG/ML injection 2 mg  2 mg Intravenous Q2H PRN Zierle-Ghosh, Asia B, DO   2 mg at 07/15/22 0209   oxyCODONE (Oxy IR/ROXICODONE) immediate release tablet 5 mg  5 mg Oral Q4H PRN Zierle-Ghosh, Asia B, DO   5 mg at 07/15/22 0344   pantoprazole (PROTONIX) injection 40 mg  40 mg Intravenous Q12H Zierle-Ghosh, Asia B, DO   40 mg at 07/14/22 2229   Current Outpatient Medications  Medication Sig Dispense Refill   albuterol (PROVENTIL) (2.5 MG/3ML) 0.083% nebulizer solution Take 3 mLs (2.5 mg total) by nebulization every 4 (four) hours as needed for  wheezing or shortness of breath. 75 mL 2   albuterol (VENTOLIN HFA) 108 (90 Base) MCG/ACT inhaler Inhale into the lungs.     budesonide (PULMICORT) 0.25 MG/2ML nebulizer solution Take 2 mLs (0.25 mg total) by nebulization 2 (two) times daily. 60 mL 12   dapagliflozin propanediol (FARXIGA) 10 MG TABS tablet Take 1 tablet (10 mg total) by mouth daily before breakfast. 100 tablet 3   guaiFENesin (MUCINEX) 600 MG 12 hr tablet Take 1 tablet (600 mg total) by mouth 2 (two) times daily. 60 tablet 2   HM SENNA-S 8.6-50 MG tablet Take 2 tablets by mouth daily.     HYDROcodone-acetaminophen (NORCO) 7.5-325 MG tablet Take 1 tablet by mouth 2 (two) times daily as needed for moderate pain.     ipratropium-albuterol (DUONEB) 0.5-2.5 (3) MG/3ML SOLN Take 3 mLs by nebulization 4 (four) times daily.     metoprolol succinate (TOPROL XL) 25 MG 24 hr tablet Take 1 tablet (25 mg total) by mouth daily. 90 tablet 3   mirtazapine  (REMERON) 15 MG tablet Take 15 mg by mouth daily.     OXYGEN Inhale 3 L into the lungs daily as needed (for shortness of breath).     pantoprazole (PROTONIX) 40 MG tablet Take 1 tablet (40 mg total) by mouth daily. 60 tablet 3   tamsulosin (FLOMAX) 0.4 MG CAPS capsule Take 0.8 mg by mouth 2 (two) times daily.     torsemide (DEMADEX) 20 MG tablet Take 1 tablet (20 mg total) by mouth as needed (swelling).     valsartan (DIOVAN) 80 MG tablet Take 80 mg by mouth daily.     atorvastatin (LIPITOR) 80 MG tablet Take 80 mg by mouth every evening. (Patient not taking: Reported on 07/14/2022)     lactulose (CHRONULAC) 10 GM/15ML solution Take 30 mLs (20 g total) by mouth 3 (three) times daily as needed for mild constipation. (Patient not taking: Reported on 07/14/2022) 1800 mL 3    Allergies as of 07/14/2022   (No Known Allergies)    Past Medical History:  Diagnosis Date   Asthma    Chronic renal insufficiency    COPD (chronic obstructive pulmonary disease) (HCC)    Heart failure ([place])    a. EF 25-30% in 08/2019 with cath showing normal cors b. EF 20-25% in 09/2020 c. 30-35% in 02/2021 d. EF 20-25% in 10/2021   Hypertension    Pulmonary emboli ([place]) 03/2019   Renal insufficiency    Type 2 diabetes mellitus ([place])     Past Surgical History:  Procedure Laterality Date   APPENDECTOMY     BALLOON DILATION N/A 03/18/2020   Procedure: BALLOON DILATION;  Surgeon: Eloise Harman, DO;  [facility];  Service: Endoscopy;  Laterality: N/A;   CHOLECYSTECTOMY     COLONOSCOPY  10/2006   Dr.Anwar:normal   ESOPHAGEAL BRUSHING  08/24/2021   Procedure: ESOPHAGEAL BRUSHING;  Surgeon: Eloise Harman, DO;  Location: [facility];  Service: Endoscopy;;   ESOPHAGOGASTRODUODENOSCOPY (EGD) WITH PROPOFOL N/A 03/18/2020   Procedure: ESOPHAGOGASTRODUODENOSCOPY (EGD) WITH PROPOFOL;  Surgeon: Eloise Harman, DO;  [facility]: [facility];  Service: Endoscopy;  Laterality: N/A;  2:30pm    ESOPHAGOGASTRODUODENOSCOPY (EGD) WITH PROPOFOL N/A 08/24/2021   Procedure: ESOPHAGOGASTRODUODENOSCOPY (EGD) WITH PROPOFOL;  Surgeon: Eloise Harman, DO;  [facility];  Service: Endoscopy;  Laterality: N/A;  with dilation   FLEXIBLE SIGMOIDOSCOPY N/A 08/24/2021   Procedure: FLEXIBLE SIGMOIDOSCOPY;  Surgeon: Eloise Harman, DO;  Location: [facility];  Service: Endoscopy;  Laterality: N/A;   NASAL SINUS SURGERY     NEPHRECTOMY     RIGHT (Question CA.  No further therapy needed.)   RIGHT/LEFT HEART CATH AND CORONARY ANGIOGRAPHY N/A 09/03/2019   Procedure: RIGHT/LEFT HEART CATH AND CORONARY ANGIOGRAPHY;  Surgeon: Lorretta Harp, MD;  Location: [facility];  Service: Cardiovascular;  Laterality: N/A;    Family History  Problem Relation Age of Onset   Stroke Mother    Breast cancer Mother    Lung cancer Brother    Colon cancer Neg Hx     Social History   Socioeconomic History   Marital status: Widowed    Spouse name: Not on file   Number of children: Not on file   Years of education: Not on file   Highest education level: Not on file  Occupational History   Not on file  Tobacco Use   Smoking status: Former    Packs/day: 1.00    Years: 15.00    Total pack years: 15.00    Types: Cigarettes    Start date: 07/14/1957    Quit date: 08/09/1976    Years since quitting: 45.9   Smokeless tobacco: Former    Types: Chew    Quit date: 02/10/2006   Tobacco comments:    chewed tobacco for 10 years  Vaping Use   Vaping Use: Never used  Substance and Sexual Activity   Alcohol use: Not Currently    Alcohol/week: 0.0 standard drinks of alcohol    Comment: no etoh since he was in his 100s   Drug use: Never   Sexual activity: Yes  Other Topics Concern   Not on file  Social History Narrative   Lives with wife.  Three children.     Social Determinants of Health   Financial Resource Strain: Not on file  Food Insecurity: No Food Insecurity (04/15/2022)   Hunger Vital  Sign    Worried About Running Out of Food in the Last Year: Never true    Ran Out of Food in the Last Year: Never true  Transportation Needs: Not on file  Physical Activity: Not on file  Stress: Not on file  Social Connections: Not on file  Intimate Partner Violence: Not on file     Review of System:   General: Negative for  weight loss, fever, chills, fatigue, ++weakness. Eyes: Negative for vision changes.  ENT: Negative for hoarseness, difficulty swallowing , nasal congestion. CV: Negative for  angina, palpitations, dyspnea on exertion, peripheral edema. See hpi Respiratory: Negative for dyspnea at rest,  cough, sputum, wheezing. +DOE GI: See history of present illness. GU:  Negative for dysuria, hematuria, urinary incontinence, urinary frequency, nocturnal urination.  MS: Negative for joint pain, low back pain.  Derm: Negative for rash or itching.  Neuro: Negative for weakness, abnormal sensation, seizure, frequent headaches, memory loss, confusion.  Psych: Negative for anxiety, depression, suicidal ideation, hallucinations.  Endo: Negative for unusual weight change.  Heme: Negative for bruising or bleeding. Allergy: Negative for rash or hives.      Physical Examination:   Vital signs in last 24 hours: Temp:  [98.2 F (36.8 C)-99.5 F (37.5 C)] 99.5 F (37.5 C) (12/07 0600) Pulse Rate:  [83-112] 94 (12/07 0500) Resp:  [16-28] 20 (12/07 0600) BP: (115-136)/(67-113) 125/113 (12/07 0600) SpO2:  [94 %-99 %] 98 % (12/07 0500) Weight:  [60.3 kg-60.9 kg] 60.3 kg (12/06 1738) Last BM Date : 07/14/22  General: Well-nourished, well-developed in no acute distress.  Head: Normocephalic, atraumatic.   Eyes: Conjunctiva pink, no icterus. Mouth: Oropharyngeal mucosa moist and pink , no lesions erythema or exudate. Neck: Supple without thyromegaly, masses, or lymphadenopathy.  Lungs: Clear to auscultation bilaterally.  Heart: Regular rate and rhythm, no murmurs rubs or gallops.   Abdomen: Bowel sounds are normal,  nondistended, no hepatosplenomegaly or masses, no abdominal bruits or hernia , no rebound or guarding.  Mild epig tenderness and periumbilical tenderness Rectal: not performed Extremities: No lower extremity edema, clubbing, deformity.  Neuro: Alert and oriented x 4 , grossly normal neurologically.  Skin: Warm and dry, no rash or jaundice.   Psych: Alert and cooperative, normal mood and affect.        Intake/Output from previous day: No intake/output data recorded. Intake/Output this shift: No intake/output data recorded.  Lab Results:   CBC Recent Labs    07/14/22 1809 07/14/22 2255 07/15/22 0459  WBC 4.2 4.7 6.8  HGB 7.3* 8.0* 7.5*  HCT 26.3* 27.2* 26.1*  MCV 94.6 92.5 92.9  PLT 208 223 211   BMET Recent Labs    07/14/22 1809 07/15/22 0459  NA 140 139  K 4.9 4.5  CL 107 110  CO2 26 26  GLUCOSE 104* 106*  BUN 12 11  CREATININE 1.17 1.18  CALCIUM 8.2* 8.0*   LFT Recent Labs    07/14/22 1809 07/15/22 0459  BILITOT 0.3 0.1*  ALKPHOS 59 65  AST 17 15  ALT 9 10  PROT 6.2* 5.7*  ALBUMIN 2.7* 2.5*    Lipase Recent Labs    07/14/22 1809  LIPASE 25   Lab Results  Component Value Date   IRON 38 01/01/2022   TIBC 290 01/01/2022   FERRITIN 99 01/01/2022   Lab Results  Component Value Date   VITAMINB12 274 09/22/2021   Lab Results  Component Value Date   FOLATE 7.6 09/22/2021     PT/INR No results for input(s): "LABPROT", "INR" in the last 72 hours.   Hepatitis Panel No results for input(s): "HEPBSAG", "HCVAB", "HEPAIGM", "HEPBIGM" in the last 72 hours.   Imaging Studies:   DG Chest Portable 1 View  Result Date: 07/14/2022 CLINICAL DATA:  Mid sternal chest pain. EXAM: PORTABLE CHEST 1 VIEW COMPARISON:  05/28/2022 and abdominal CT 05/28/2022 FINDINGS: Chronic densities in the left lung base are compatible with bronchiectasis from previous CT. Remainder of the lungs are clear without new airspace disease or  pulmonary edema. Heart size is upper limits of normal but stable. Atherosclerotic calcifications at the aortic arch. Negative for a pneumothorax. IMPRESSION: Chronic changes at the left lung base. No acute findings. Electronically Signed   By: Markus Daft M.D.   On: 07/14/2022 18:10  [4 week]  Assessment:  81 y/o male with complicated past medical history including heart failure with reduced ejection fraction, COPD, chronic respiratory failure, h/o PE, renal insufficiency, chronic dysphagia, recurrent candidal esophagitis, chronic GERD, constipation, iron deficiency anemia presented to the emergency department with chest pain radiating into the upper abdomen. GI consulted for melena, and decline in HGB.  Anemia: On presentation hemoglobin 7.3-->8-->7.5.  In October hemoglobin 8.2.  Over the past 6 months his hemoglobin has been in the 8-9 range.  Patient states his stools have been dark, potentially black over the past few days but states his last bowel movement was 2 days ago.  Denies brbpr.  Complains of fatigue.  Presents with chest pain which radiates into the epigastric region.  Chronically on PPI.  Last EGD, flex sig as outlined above.  Outpatient workup has been delayed pending pulmonology clearance.  Abdominal pain: On exam he has both epigastric and periumbilical pain which is mild.  Patient difficult historian.  Believes he has a postprandial component.  He complains of constipation.  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  Mild diffuse thickened appearance of the colon likely due to underdistention.  Suspect his abdominal pain is multifactorial.   Plan:   Recommend IV PPI twice daily. Ideally would consider EGD while inpatient if felt to be medically stable and appropriate. Monitor H&H, transfuse if needed. Monitor abdominal pain,  if progressive, consider repeat CT imaging.   LOS: 0 days   We would like to thank you for the opportunity to participate in the care of Gregory Mitchell.  Laureen Ochs. Bernarda Caffey [facility] [phone] 12/7/20237:10 AM

## 2022-07-16 ENCOUNTER — Observation Stay (HOSPITAL_BASED_OUTPATIENT_CLINIC_OR_DEPARTMENT_OTHER): Payer: Medicare HMO | Admitting: Anesthesiology

## 2022-07-16 ENCOUNTER — Observation Stay (HOSPITAL_COMMUNITY): Payer: Medicare HMO | Admitting: Anesthesiology

## 2022-07-16 ENCOUNTER — Telehealth: Payer: Self-pay | Admitting: Cardiology

## 2022-07-16 ENCOUNTER — Encounter (HOSPITAL_COMMUNITY): Payer: Self-pay | Admitting: Family Medicine

## 2022-07-16 ENCOUNTER — Encounter (HOSPITAL_COMMUNITY)
Admission: EM | Discharge status: Against Medical Advice | Payer: Self-pay | Source: Home / Self Care | Attending: Emergency Medicine

## 2022-07-16 DIAGNOSIS — R1310 Dysphagia, unspecified: Secondary | ICD-10-CM | POA: Diagnosis not present

## 2022-07-16 DIAGNOSIS — K5903 Drug induced constipation: Secondary | ICD-10-CM | POA: Diagnosis not present

## 2022-07-16 DIAGNOSIS — K219 Gastro-esophageal reflux disease without esophagitis: Secondary | ICD-10-CM | POA: Diagnosis not present

## 2022-07-16 DIAGNOSIS — K222 Esophageal obstruction: Secondary | ICD-10-CM

## 2022-07-16 DIAGNOSIS — I110 Hypertensive heart disease with heart failure: Secondary | ICD-10-CM | POA: Diagnosis not present

## 2022-07-16 DIAGNOSIS — D649 Anemia, unspecified: Secondary | ICD-10-CM | POA: Diagnosis not present

## 2022-07-16 DIAGNOSIS — R1013 Epigastric pain: Secondary | ICD-10-CM | POA: Diagnosis not present

## 2022-07-16 DIAGNOSIS — I5042 Chronic combined systolic (congestive) and diastolic (congestive) heart failure: Secondary | ICD-10-CM

## 2022-07-16 DIAGNOSIS — J9611 Chronic respiratory failure with hypoxia: Secondary | ICD-10-CM | POA: Diagnosis not present

## 2022-07-16 DIAGNOSIS — Z87891 Personal history of nicotine dependence: Secondary | ICD-10-CM

## 2022-07-16 DIAGNOSIS — I10 Essential (primary) hypertension: Secondary | ICD-10-CM | POA: Diagnosis not present

## 2022-07-16 HISTORY — PX: ESOPHAGOGASTRODUODENOSCOPY (EGD) WITH PROPOFOL: SHX5813

## 2022-07-16 HISTORY — PX: MALONEY DILATION: SHX5535

## 2022-07-16 LAB — CBC
HCT: 27.2 % — ABNORMAL LOW (ref 39.0–52.0)
Hemoglobin: 8 g/dL — ABNORMAL LOW (ref 13.0–17.0)
MCH: 27.3 pg (ref 26.0–34.0)
MCHC: 29.4 g/dL — ABNORMAL LOW (ref 30.0–36.0)
MCV: 92.8 fL (ref 80.0–100.0)
Platelets: 258 10*3/uL (ref 150–400)
RBC: 2.93 MIL/uL — ABNORMAL LOW (ref 4.22–5.81)
RDW: 15 % (ref 11.5–15.5)
WBC: 5.4 10*3/uL (ref 4.0–10.5)
nRBC: 0 % (ref 0.0–0.2)

## 2022-07-16 LAB — BASIC METABOLIC PANEL
Anion gap: 2 — ABNORMAL LOW (ref 5–15)
BUN: 7 mg/dL — ABNORMAL LOW (ref 8–23)
CO2: 27 mmol/L (ref 22–32)
Calcium: 7.9 mg/dL — ABNORMAL LOW (ref 8.9–10.3)
Chloride: 111 mmol/L (ref 98–111)
Creatinine, Ser: 1.09 mg/dL (ref 0.61–1.24)
GFR, Estimated: >60 mL/min (ref >60)
Glucose, Bld: 90 mg/dL (ref 70–99)
Potassium: 4.3 mmol/L (ref 3.5–5.1)
Sodium: 140 mmol/L (ref 135–145)

## 2022-07-16 LAB — GLUCOSE, CAPILLARY
Glucose-Capillary: 147 mg/dL — ABNORMAL HIGH (ref 70–99)
Glucose-Capillary: 77 mg/dL (ref 70–99)
Glucose-Capillary: 91 mg/dL (ref 70–99)

## 2022-07-16 SURGERY — ESOPHAGOGASTRODUODENOSCOPY (EGD) WITH PROPOFOL
Anesthesia: General

## 2022-07-16 MED ORDER — DEXTROSE 50 % IV SOLN
INTRAVENOUS | Status: AC
  Filled 2022-07-16: qty 50

## 2022-07-16 MED ORDER — ARFORMOTEROL TARTRATE 15 MCG/2ML IN NEBU
15.0000 ug | INHALATION_SOLUTION | Freq: Two times a day (BID) | RESPIRATORY_TRACT | Status: DC
  Administered 2022-07-16 – 2022-07-17 (×3): 15 ug via RESPIRATORY_TRACT
  Filled 2022-07-16 (×3): qty 2

## 2022-07-16 MED ORDER — PROPOFOL 10 MG/ML IV BOLUS
INTRAVENOUS | Status: DC | PRN
  Administered 2022-07-16: 5 mg via INTRAVENOUS
  Administered 2022-07-16: 50 mg via INTRAVENOUS
  Administered 2022-07-16 (×2): 75 mg via INTRAVENOUS

## 2022-07-16 MED ORDER — LIDOCAINE VISCOUS HCL 2 % MT SOLN
OROMUCOSAL | Status: AC
  Filled 2022-07-16: qty 15

## 2022-07-16 MED ORDER — LIDOCAINE HCL 1 % IJ SOLN
INTRAMUSCULAR | Status: DC | PRN
  Administered 2022-07-16: 50 mg via INTRADERMAL

## 2022-07-16 MED ORDER — SODIUM CHLORIDE 0.9 % IV SOLN: INTRAVENOUS | Status: DC

## 2022-07-16 MED ORDER — DEXTROSE 50 % IV SOLN
25.0000 mL | Freq: Once | INTRAVENOUS | Status: AC
  Administered 2022-07-16: 25 mL via INTRAVENOUS

## 2022-07-16 MED ORDER — LACTATED RINGERS IV SOLN: INTRAVENOUS | Status: DC

## 2022-07-16 MED ORDER — LIDOCAINE VISCOUS HCL 2 % MT SOLN: 15.0000 mL | Freq: Once | OROMUCOSAL | Status: DC

## 2022-07-16 MED ORDER — MELATONIN 3 MG PO TABS
6.0000 mg | ORAL_TABLET | Freq: Every day | ORAL | Status: DC
  Administered 2022-07-16 (×2): 6 mg via ORAL
  Filled 2022-07-16 (×2): qty 2

## 2022-07-16 NOTE — Anesthesia Postprocedure Evaluation (Signed)
Anesthesia Post Note  Patient: Gregory Mitchell  Procedure(s) Performed: ESOPHAGOGASTRODUODENOSCOPY (EGD) WITH PROPOFOL [place]  Patient location during evaluation: Phase II Anesthesia Type: General Level of consciousness: awake and alert and oriented Pain management: pain level controlled Vital Signs Assessment: post-procedure vital signs reviewed and stable Respiratory status: spontaneous breathing, nonlabored ventilation and respiratory function stable Cardiovascular status: blood pressure returned to baseline and stable Postop Assessment: no apparent nausea or vomiting Anesthetic complications: no  No notable events documented.   Last Vitals:  Vitals:   07/16/22 1515 07/16/22 1530  BP: 110/72 114/65  Pulse: 92 87  Resp: (!) 37 20  Temp:    SpO2: 100% 100%    Last Pain:  Vitals:   07/16/22 1510  TempSrc:   PainSc: Asleep                 Uziel Covault C Shariq Puig

## 2022-07-16 NOTE — Op Note (Signed)
[facility] Patient Name: Gregory Mitchell Procedure Date: 07/16/2022 2:22 PM MRN: [id number] Date of Birth: 1940/09/06 Attending MD: Norvel Richards , MD, [id number] CSN: [id number] Age: 81 Admit Type: Outpatient Procedure:                Upper GI endoscopy Indications:              Epigastric abdominal pain, Dysphagia Providers:                Norvel Richards, MD, Lurline Del, RN, Everardo Pacific Referring MD:              Medicines:                Propofol per Anesthesia Complications:            No immediate complications. Estimated Blood Loss:     Estimated blood loss was minimal. Procedure:                Pre-Anesthesia Assessment:                           - Prior to the procedure, a History and Physical                            was performed, and patient medications and                            allergies were reviewed. The patient's tolerance of                            previous anesthesia was also reviewed. The risks                            and benefits of the procedure and the sedation                            options and risks were discussed with the patient.                            All questions were answered, and informed consent                            was obtained. Prior Anticoagulants: The patient has                            taken no anticoagulant or antiplatelet agents. ASA                            Grade Assessment: III - A patient with severe                            systemic disease. After reviewing the risks and  benefits, the patient was deemed in satisfactory                            condition to undergo the procedure.                           After obtaining informed consent, the endoscope was                            passed under direct vision. Throughout the                            procedure, the patient's blood pressure, pulse, and                             oxygen saturations were monitored continuously. The                            GIF-H190 ([id number]) scope was introduced through the                            mouth, and advanced to the second part of duodenum.                            The upper GI endoscopy was accomplished without                            difficulty. The patient tolerated the procedure                            well. Scope In: 2:56:37 PM Scope Out: 3:03:52 PM Total Procedure Duration: 0 hours 7 minutes 15 seconds  Findings:      Esophageal mucosa appeared normal aside from a noncritical Schatzki's       ring at the GE junction. No nodularity. No's esophagitis. No Barrett's       epithelium seen.      Gastric cavity empty. Insufflated well with air. There examination       gastric mucosa including retroflexion view demonstrated evidence of       intact fundoplication with a suture material protruding from the fundus.       Pylorus patent easily traversed.      The duodenal bulb and second portion of the duodenum were normal. I       removed the scope obtained a 56 French Maloney dilator passed distal and       full insertion with mild resistance. A look back revealed a superficial       tear through the upper esophageal sphincter and disruption of the ring       with minimal bleeding and no apparent complication Impression:               - Noncritical. Schatzki's ring status post dilation                            as described evidence of intact fundoplication with  protruding sutures; otherwise, normal-appearing                            stomach, D1 and D2 normal gastroesophageal junction.                           - Normal duodenal bulb and second portion of the                            duodenum.                           - Moderate Sedation:      Moderate (conscious) sedation was personally administered by an       anesthesia professional. The following parameters were monitored:  oxygen       saturation, heart rate, blood pressure, respiratory rate, EKG, adequacy       of pulmonary ventilation, and response to care. Recommendation:           - Patient has a contact number available for                            emergencies. The signs and symptoms of potential                            delayed complications were discussed with the                            patient. Return to normal activities tomorrow.                            Written discharge instructions were provided to the                            patient.                           -Clear liquid diet today; advance in the morning as                            tolerated. At patient request, I called Apolonio Schneiders,                            daughter, at [phone]. Reviewed findings and                            recommendations Procedure Code(s):        --- Professional ---                           [phone], Esophagogastroduodenoscopy, flexible,                            transoral; diagnostic, including collection of                            specimen(s) by brushing  or washing, when performed                            (separate procedure) Diagnosis Code(s):        --- Professional ---                           R10.13, Epigastric pain                           R13.10, Dysphagia, unspecified CPT copyright 2022 [employer]. All rights reserved. The codes documented in this report are preliminary and upon coder review may  be revised to meet current compliance requirements. Cristopher Estimable. Nakyia Dau, MD Norvel Richards, MD 07/16/2022 3:15:05 PM This report has been signed electronically. Number of Addenda: 0

## 2022-07-16 NOTE — Transfer of Care (Signed)
Immediate Anesthesia Transfer of Care Note  Patient: Gregory Mitchell  Procedure(s) Performed: ESOPHAGOGASTRODUODENOSCOPY (EGD) WITH PROPOFOL MALONEY DILATION  Patient Location: PACU  Anesthesia Type:General  Level of Consciousness: awake  Airway & Oxygen Therapy: Patient Spontanous Breathing  Post-op Assessment: Report given to RN  Post vital signs: Reviewed and stable  Last Vitals:  Vitals Value Taken Time  BP    Temp    Pulse 92 07/16/22 1511  Resp 38 07/16/22 1511  SpO2 98 % 07/16/22 1511  Vitals shown include unvalidated device data.  Last Pain:  Vitals:   07/16/22 1450  TempSrc:   PainSc: 4       Patients Stated Pain Goal: 7 (62/[id number]/76 [id number])  Complications: No notable events documented.

## 2022-07-16 NOTE — Telephone Encounter (Signed)
Spoke with pulmonary Dr Melvyn Novas, felt that the change from entresto to ARB has not really helped from pulmonary standpoint and would be ok restarting entresto. Patient admitted with GI bleeding, we will consider change at our f/u 12/18 as outpatient.   Zandra Abts MD

## 2022-07-16 NOTE — Assessment & Plan Note (Signed)
--   no complaints of pain today

## 2022-07-16 NOTE — Anesthesia Preprocedure Evaluation (Addendum)
Anesthesia Evaluation  Patient identified by MRN, date of birth, ID band Patient awake    Reviewed: Allergy & Precautions, H&P , NPO status , Patient's Chart, lab work & pertinent test results, reviewed documented beta blocker date and time   Airway Mallampati: II  TM Distance: >3 FB Neck ROM: Full    Dental  (+) Edentulous Upper, Edentulous Lower   Pulmonary shortness of breath and Long-Term Oxygen Therapy, asthma , pneumonia, COPD,  oxygen dependent, former smoker, PE  Shallow breathing  breath sounds clear to auscultation       Cardiovascular Exercise Tolerance: Poor hypertension, Pt. on medications and Pt. on home beta blockers +CHF (EF 20 to 25%)  Normal cardiovascular exam+ dysrhythmias (Prolonged QT)  Rhythm:Regular Rate:Normal  1. Left ventricular ejection fraction, by estimation, is 20 to 25%. The  left ventricle has severely decreased function. The left ventricle  demonstrates global hypokinesis. The left ventricular internal cavity size  was moderately dilated. There is mild  left ventricular hypertrophy. Left ventricular diastolic parameters are  indeterminate.   2. Right ventricular systolic function is normal. The right ventricular  size is normal. There is normal pulmonary artery systolic pressure.   3. Left atrial size was severely dilated.   4. Right atrial size was severely dilated.   5. The mitral valve is normal in structure. No evidence of mitral valve  regurgitation. No evidence of mitral stenosis.   6. The tricuspid valve is abnormal.   7. The aortic valve is tricuspid. Aortic valve regurgitation is not  visualized. No aortic stenosis is present.   8. The inferior vena cava is normal in size with greater than 50%  respiratory variability, suggesting right atrial pressure of 3 mmHg.  14-Jul-2022 17:30:35 [facility] ROUTINE RECORD [phone] (85 yr) Male Black Vent. rate 85 BPM PR  interval 212 ms QRS duration 137 ms QT/QTcB 422/502 ms P-R-T axes -32 -72 229 Sinus rhythm Multiple ventricular premature complexes Borderline prolonged PR interval RBBB and LAFB Abnrm T, consider ischemia, anterolateral lds      Neuro/Psych negative neurological ROS  negative psych ROS   GI/Hepatic Neg liver ROS,GERD  Medicated and Controlled,,  Endo/Other  diabetes, Well Controlled, Type 2, Oral Hypoglycemic Agents    Renal/GU Renal InsufficiencyRenal disease  negative genitourinary   Musculoskeletal negative musculoskeletal ROS (+)    Abdominal   Peds negative pediatric ROS (+)  Hematology  (+) Blood dyscrasia, anemia   Anesthesia Other Findings IMPRESSION: [name] has normal coronary arteries and low filling pressures suggesting that he has been adequately or over diuresed.  He did have a high V waves suggesting that he potentially has significant mitral regurgitation.  His systolic pressures were in the mid to high 90s and I therefore gave him a 250 cc bolus of saline given his low LVEDP and wedge.  He will need guideline directed optimal medical therapy for his LV dysfunction.  The sheath removed and a TR band was placed on the right wrist to achieve patent hemostasis.  The patient left lab in stable condition.  His renal function be carefully monitored.  Dr. Davina Poke was notified of these results.   Quay Burow. MD, [facility] 09/03/2019 9:55 AM   Reproductive/Obstetrics negative OB ROS                             Anesthesia Physical Anesthesia Plan  ASA: 4  Anesthesia Plan: General   Post-op Pain Management:  Minimal or no pain anticipated   Induction: Intravenous  PONV Risk Score and Plan: 1 and TIVA  Airway Management Planned: Nasal Cannula and Natural Airway  Additional Equipment:   Intra-op Plan:   Post-operative Plan: Possible Post-op intubation/ventilation  Informed Consent: I have reviewed the patients History and  Physical, chart, labs and discussed the procedure including the risks, benefits and alternatives for the proposed anesthesia with the patient or authorized representative who has indicated his/her understanding and acceptance.       Plan Discussed with: CRNA and Surgeon  Anesthesia Plan Comments:        Anesthesia Quick Evaluation

## 2022-07-16 NOTE — Progress Notes (Signed)
Patient slept on and off throughout this shift, when nurse would enter room to check on patient, he was sleeping. Patient made multiple complaints that he could not sleep, Nurse made Dr. Josph Macho aware and new orders added. Melatonin administered, patient still called to desk saying he could not sleep and wanted a popsicle, secretary reminded patient that he is NPO and could not have a popsicle. Nurse went into room to speak with patient about his sleep and diet, patient was asleep. No further complaints at this time, no evidence of pain or discomfort at this time.

## 2022-07-16 NOTE — Consult Note (Signed)
NAME:  Gregory Mitchell, MRN:  [id number], DOB:  Apr 16, 1941, LOS: 0 ADMISSION DATE:  07/14/2022, CONSULTATION DATE:  12/8 REFERRING MD:  Wynetta Emery, CHIEF COMPLAINT:  chest pain/ sob    History of Present Illness:  80   yobm  quit smoking 1978  no apparent  resp sequelae referred to pulmonary clinic in [facility]  01/28/2022 for copd  by Bernerd Pho PA following in cards clinic for severe chf     Prior pulmonary eval 2015 c/o doe x 2 y with GOLD 3 criteria at that point with hrct 11/16/13 with bronchiectasis ? MAI  Rx 02 around 2021  ? After covid ? Uses prn but very poor insight as to when to use it (mostly p exertion)   Recent trial off entresto and just on valsartan due to concerns of refractory cough/ hoarseness an seen in office 12/6 with new cp/abd pain midline and worse breathing and was supposed to be on ppi bid ac and neb laba/ics but confused with names of meds and now admitted and placed on same meds as at home and "feels a lot better" but has remained mostly in bed since admit.   For EGD 12/8 due to abd pain, anemia with reported pos fecal OB      Significant Hospital Events: Including procedures, antibiotic start and stop dates in addition to other pertinent events   Echo 12/6 no real change since on prior study which was done on entresto   Interim History / Subjective:  Says feels better and wants to go home   Objective   Blood pressure 137/74, pulse 74, temperature 98.8 F (37.1 C), temperature source Oral, resp. rate (!) 25, height '5\' 6"'$  (1.676 m), weight 62.8 kg, SpO2 100 %.        Intake/Output Summary (Last 24 hours) at 07/16/2022 1248 Last data filed at 07/15/2022 2221 Gross per 24 hour  Intake 200 ml  Output 350 ml  Net -150 ml   Filed Weights   07/14/22 1738 07/15/22 1506 07/16/22 1142  Weight: 60.3 kg 62.8 kg 62.8 kg    Examination: Tmax:  99.7 General appearance:   Elderly bm lying down at 30 degrees hob/ quite hoarse, no wob or cough  At Rest 02 sats   98% on 3lpm   No jvd Oropharynx clear,  mucosa nl Neck supple Lungs with coarse insp/exp  rhonchi bilaterally RRR no s3 or or sign murmur Abd soft with limited  excursion  Extr warm with no edema or clubbing noted Neuro  Sensorium poor recall for meds/ instructions,  no apparent motor deficits     I personally reviewed images and agree with radiology impression as follows:  CXR:   portable  12/6 Chronic changes at the left lung base. No acute findings.    Assessment & Plan:  1) GOLD 3 copd/ 02 dep but non-adherent  >>> optimal should be on triple therapy with LAMA/LABA/ICS but has had a great deal of difficulty accessing so will first just start back on lama/ics and see how that goes with f/u in office with all meds in hand using a trust but verify approach to confirm accurate Medication  Reconciliation The principal here is that until we are certain that the  patients are doing what we've asked, it makes no sense to ask them to do more.   2) Bronchiectasis with recurrent pna >>> check sputum if purulent >>> use low dose ICS plus mucinex 1200 mg bid and max rx for GERD  3) upper airway cough syndrome  CT sinus 04/14/22 There is marked severity bilateral maxillary sinus and bilateral ethmoid sinus mucosal thickening. - referred to ENT 05/06/2022 >>>  07/12/22 VC atrophy/ gerd likely > max rx   4) Severe systolic CHF no worse ef off entresto but not convincingly better off entresto >> ok to resume en  5) Normocytic anemia/ abd pain while supposednly on ppi bid ac but difficult to make sure he's taking it   6)  Chronic hypoxemic resp failure/ ? 02 dep 24/7  >>>> 02 2lpm hs/ advised: Make sure you check your oxygen saturation  AT  your highest level of activity (not after you stop)   to be sure it stays over 90% and adjust  02 flow upward to maintain this level if needed but remember to turn it back to previous settings when you stop (to conserve your supply).   Labs   CBC: Recent  Labs  Lab 07/14/22 1809 07/14/22 2255 07/15/22 0459 07/16/22 0438  WBC 4.2 4.7 6.8 5.4  NEUTROABS  --   --  4.2  --   HGB 7.3* 8.0* 7.5* 8.0*  HCT 26.3* 27.2* 26.1* 27.2*  MCV 94.6 92.5 92.9 92.8  PLT 208 223 211 119    Basic Metabolic Panel: Recent Labs  Lab 07/14/22 1809 07/15/22 0459 07/16/22 0438  NA 140 139 140  K 4.9 4.5 4.3  CL 107 110 111  CO2 '26 26 27  '$ GLUCOSE 104* 106* 90  BUN 12 11 7*  CREATININE 1.17 1.18 1.09  CALCIUM 8.2* 8.0* 7.9*  MG  --  2.2  --    GFR: Estimated Creatinine Clearance: 46.4 mL/min (by C-G formula based on SCr of 1.09 mg/dL). Recent Labs  Lab 07/14/22 1809 07/14/22 2255 07/15/22 0459 07/16/22 0438  WBC 4.2 4.7 6.8 5.4    Liver Function Tests: Recent Labs  Lab 07/14/22 1809 07/15/22 0459  AST 17 15  ALT 9 10  ALKPHOS 59 65  BILITOT 0.3 0.1*  PROT 6.2* 5.7*  ALBUMIN 2.7* 2.5*   Recent Labs  Lab 07/14/22 1809  LIPASE 25   No results for input(s): "AMMONIA" in the last 168 hours.  ABG    Component Value Date/Time   PHART 7.379 09/03/2019 0928   PCO2ART 46.7 09/03/2019 0928   PO2ART 94.0 09/03/2019 0928   HCO3 29.0 (H) 05/28/2022 2319   TCO2 29 09/03/2019 0928   O2SAT 89.5 05/28/2022 2319     Coagulation Profile: No results for input(s): "INR", "PROTIME" in the last 168 hours.  Cardiac Enzymes: No results for input(s): "CKTOTAL", "CKMB", "CKMBINDEX", "TROPONINI" in the last 168 hours.  HbA1C: Hgb A1c MFr Bld  Date/Time Value Ref Range Status  08/22/2021 06:21 PM 6.1 (H) 4.8 - 5.6 % Final    Comment:    (NOTE) Pre diabetes:          5.7%-6.4%  Diabetes:              >6.4%  Glycemic control for   <7.0% adults with diabetes   10/14/2020 01:34 AM 7.2 (H) 4.8 - 5.6 % Final    Comment:    (NOTE) Pre diabetes:          5.7%-6.4%  Diabetes:              >6.4%  Glycemic control for   <7.0% adults with diabetes     CBG: Recent Labs  Lab 07/14/22 1821 07/16/22 1148 07/16/22 1219  GLUCAP 102* 77  147*      Past Medical History:  He,  has a past medical history of Asthma, Chronic renal insufficiency, COPD (chronic obstructive pulmonary disease) ([place]), Heart failure ([place]), Hypertension, Pulmonary emboli ([place]) (03/2019), Renal insufficiency, and Type 2 diabetes mellitus ([place]).   Surgical History:   Past Surgical History:  Procedure Laterality Date   APPENDECTOMY     BALLOON DILATION N/A 03/18/2020   Procedure: BALLOON DILATION;  Surgeon: Eloise Harman, DO;  [facility];  Service: Endoscopy;  Laterality: N/A;   CHOLECYSTECTOMY     COLONOSCOPY  10/2006   Dr.Anwar:normal   ESOPHAGEAL BRUSHING  08/24/2021   Procedure: ESOPHAGEAL BRUSHING;  Surgeon: Eloise Harman, DO;  [facility];  Service: Endoscopy;;   ESOPHAGOGASTRODUODENOSCOPY (EGD) WITH PROPOFOL N/A 03/18/2020   Procedure: ESOPHAGOGASTRODUODENOSCOPY (EGD) WITH PROPOFOL;  Surgeon: Eloise Harman, DO;  [facility];  Service: Endoscopy;  Laterality: N/A;  2:30pm   ESOPHAGOGASTRODUODENOSCOPY (EGD) WITH PROPOFOL N/A 08/24/2021   Procedure: ESOPHAGOGASTRODUODENOSCOPY (EGD) WITH PROPOFOL;  Surgeon: Eloise Harman, DO;  [facility];  Service: Endoscopy;  Laterality: N/A;  with dilation   FLEXIBLE SIGMOIDOSCOPY N/A 08/24/2021   Procedure: FLEXIBLE SIGMOIDOSCOPY;  Surgeon: Eloise Harman, DO;  [facility];  Service: Endoscopy;  Laterality: N/A;   NASAL SINUS SURGERY     NEPHRECTOMY     RIGHT (Question CA.  No further therapy needed.)   RIGHT/LEFT HEART CATH AND CORONARY ANGIOGRAPHY N/A 09/03/2019   Procedure: RIGHT/LEFT HEART CATH AND CORONARY ANGIOGRAPHY;  Surgeon: Lorretta Harp, MD;  Location: [place] [facility];  Service: Cardiovascular;  Laterality: N/A;     Social History:   reports that he quit smoking about 45 years ago. His smoking use included cigarettes. He started smoking about 65 years ago. He has a 15.00 pack-year smoking history. He quit smokeless  tobacco use about 16 years ago.  His smokeless tobacco use included chew. He reports that he does not currently use alcohol. He reports that he does not use drugs.   Family History:  His family history includes Breast cancer in his mother; Lung cancer in his brother; Stroke in his mother. There is no history of Colon cancer.   Allergies No Known Allergies   Home Medications  - NOTE:   Unable to verify as accurately reflecting what pt takes   Prior to Admission medications   Medication Sig Start Date End Date Taking? Authorizing Provider  albuterol (PROVENTIL) (2.5 MG/3ML) 0.083% nebulizer solution Take 3 mLs (2.5 mg total) by nebulization every 4 (four) hours as needed for wheezing or shortness of breath. 04/16/22 04/16/23 Yes Emokpae, Courage, MD  albuterol (VENTOLIN HFA) 108 (90 Base) MCG/ACT inhaler Inhale into the lungs. 07/13/22  Yes [provider]  budesonide (PULMICORT) 0.25 MG/2ML nebulizer solution Take 2 mLs (0.25 mg total) by nebulization 2 (two) times daily. 01/28/22  Yes Tanda Rockers, MD  dapagliflozin propanediol (FARXIGA) 10 MG TABS tablet Take 1 tablet (10 mg total) by mouth daily before breakfast. 02/25/22  Yes Branch, Alphonse Guild, MD  guaiFENesin (MUCINEX) 600 MG 12 hr tablet Take 1 tablet (600 mg total) by mouth 2 (two) times daily. 04/16/22 04/16/23 Yes Emokpae, Courage, MD  HM SENNA-S 8.6-50 MG tablet Take 2 tablets by mouth daily. 07/10/21  Yes [provider]  HYDROcodone-acetaminophen (NORCO) 7.5-325 MG tablet Take 1 tablet by mouth 2 (two) times daily as needed for moderate pain. 11/27/20  Yes [provider]  ipratropium-albuterol (DUONEB) 0.5-2.5 (3) MG/3ML SOLN Take 3 mLs by nebulization 4 (four) times daily. 06/30/22  Yes [provider]  metoprolol succinate (TOPROL XL) 25 MG 24 hr tablet Take 1 tablet (25 mg total) by mouth daily. 12/18/21  Yes Strader, [place] M, PA-C  mirtazapine (REMERON) 15 MG tablet Take 15 mg by mouth daily. 07/07/22   Yes [provider]  OXYGEN Inhale 3 L into the lungs daily as needed (for shortness of breath).   Yes [provider]  pantoprazole (PROTONIX) 40 MG tablet Take 1 tablet (40 mg total) by mouth daily. 04/16/22  Yes Emokpae, Courage, MD  tamsulosin (FLOMAX) 0.4 MG CAPS capsule Take 0.8 mg by mouth 2 (two) times daily.   Yes [provider]  torsemide (DEMADEX) 20 MG tablet Take 1 tablet (20 mg total) by mouth as needed (swelling). 03/23/22  Yes Arnoldo Lenis, MD  valsartan (DIOVAN) 80 MG tablet Take 80 mg by mouth daily. 07/13/22  Yes [provider]  atorvastatin (LIPITOR) 80 MG tablet Take 80 mg by mouth every evening. Patient not taking: Reported on 07/14/2022 04/10/22   [provider]  lactulose (CHRONULAC) 10 GM/15ML solution Take 30 mLs (20 g total) by mouth 3 (three) times daily as needed for mild constipation. Patient not taking: Reported on 07/14/2022 03/13/22   Mahala Menghini, PA-C

## 2022-07-16 NOTE — Interval H&P Note (Signed)
History and Physical Interval Note:  07/16/2022 2:49 PM  Gregory Mitchell  has presented today for surgery, with the diagnosis of anemia, epigastric pain, esophagitis.  The various methods of treatment have been discussed with the patient and family. After consideration of risks, benefits and other options for treatment, the patient has consented to  Procedure(s): ESOPHAGOGASTRODUODENOSCOPY (EGD) WITH PROPOFOL (N/A) as a surgical intervention.  The patient's history has been reviewed, patient examined, no change in status, stable for surgery.  I have reviewed the patient's chart and labs.  Questions were answered to the patient's satisfaction.     Gregory Mitchell   patient seen in short stay.  Hemoglobin 8.0 this morning.  Some vague esophageal dysphagia.  He is hungry.  He has not had any melena today.  Agree with need to perform EGD today to further evaluate his presentation. The risks, benefits, limitations, alternatives and imponderables have been reviewed with the patient. Potential for esophageal dilation, biopsy, etc. have also been reviewed.  Questions have been answered. All parties agreeable.

## 2022-07-16 NOTE — Progress Notes (Signed)
Patient seen and examined this morning.  Currently without any shortness of breath and doing well from respiratory standpoint.  Oxygen saturations maintained greater than 95% on 4 L nasal cannula.  Lungs clear, diminished in the bases with normal respiratory effort.  He has been n.p.o., had some hard candies last night prior to midnight.  Patient stable to undergo EGD today with Dr. Gala Romney.  Venetia Night, MSN, APRN, FNP-BC, AGACNP-BC [facility] at [facility]

## 2022-07-16 NOTE — Assessment & Plan Note (Addendum)
--   miralax BID ordered  -- no BM yet

## 2022-07-16 NOTE — Assessment & Plan Note (Signed)
--   Hg 8.0 today, follow

## 2022-07-16 NOTE — Progress Notes (Signed)
Daughter is wanting pt. To be discharged as soon as possible. She is concerned that he may leave AMA.

## 2022-07-16 NOTE — Progress Notes (Signed)
PROGRESS NOTE   Gregory Mitchell  FIE:[id number] DOB: 14-Aug-1940 DOA: 07/14/2022 PCP: Beola Cord, FNP   Chief Complaint  Patient presents with   Chest Pain   Level of care: Med-Surg  Brief Admission History:  81 y.o. male with medical history significant of asthma, chronic renal insufficiency, COPD, hypertension, history of PE no longer on anticoagulation, and more presents to ED with a chief complaint of chest pain and epigastric pain.  Patient has had chronic chest pain, but reports that this is different because it goes down into his abdomen. Patient reports that it all started with chest tightness. The tightness was intermittent. Using his nebulizer seemed to improve the pain. Sitting down or laying down seemed to make it worse, per the patient. He does wear 3L nasal cannula at home. He noticed himself wheezing, but that was also improved with neb treatments. He has had a productive cough with yellow sputum. He believes his cough is not much different than normal. Patient reports that his epigastric pain feels like a cramping. It is also intermittent. The cramping is worse when he has increased work of breathing. Eating makes his pain worse as well, which has affected his appetite. His last normal meal was 12/5. Patient denies nausea and vomiting. He reports that he has had 2 melenotic stools daily for a "few" days. He has been on anticoagulation in the past - eliquis - but he is not on it any longer. Daughter at bedside thinks he was taken off of it because of GI bleed. Patient has had to be transfused in the past, and is willing to be transfused again. When he last had to be transfused his only symptom was cold intolerance per the daughter. He did not have chest pain at that time. Patient has no other complaints at this time.    Assessment and Plan: * Epigastric pain - With melena and decreasing hemoglobin - Concern for peptic ulcer disease - Protonix twice daily - N.p.o. - Consult  GI - Patient was already typed and screened - Trend hemoglobin every 6 hours - Continue to monitor  Constipation due to opioid therapy -- miralax BID ordered  -- no BM yet   Anemia -- Hg 8.0 today, follow   Periumbilical pain -- no complaints of pain today   Hypoalbuminemia Albumin low at 2.7 - Likely secondary to poor p.o. intake - When patient is able to tolerate food encourage nutrient dense food choices  Acute on chronic anemia - Baseline hemoglobin around 8.5 - Hemoglobin was down to 7.3 - Melena for 2 days per pt reporting prior to arrival - Follow CBC.  Hg 8 today - N.p.o. pending GI procedures - Protonix twice daily - hemoccult stools - Consult GI  Chronic respiratory failure with hypoxia (HCC) - Secondary to COPD and some pulmonary fibrosis with chronic changes on chest x-ray - 3 L nasal cannula at baseline to wear continuously; he saw Dr. Melvyn Novas on 07/14/22 in pulmonary office  - Continue 3 L nasal cannula - Continue Pulmicort - Albuterol as needed - Continue to monitor  Elevated troponin - Troponin 22 - Last troponin was 26, 1 month ago - Repeat troponin 23-flat - Low suspicion for cardiac etiology to chest pain  Chronic combined systolic and diastolic CHF (congestive heart failure) ([place]) - Last echo in March 2023 shows an ejection fraction of 20-25% - appears euvolemic at this time - Continue beta-blocker, statin, ARB - Troponin slightly elevated at 22, which is actually downtrending from  last troponin - Cardiac etiology of chest/epigastric pain today is low on the differential - Continue to monitor  GERD (gastroesophageal reflux disease) - Holding p.o. Protonix - Twice daily IV Protonix  Essential hypertension Continue metoprolol, valsartan  Chest pain - Different than his normal chronic chest pain - Not likely to be cardiac related with flat troponin - no acute ST segment changes - Echo stable from recent testing, do not feel this is ACS -  Monitor on telemetry   DVT prophylaxis: SCDs Code Status: full  Family Communication:  Disposition: Status is: Observation   Consultants:  GI  Procedures:   Antimicrobials:    Subjective: Pt reports no pain today, no BM, agreeable to EGD    Objective: Vitals:   07/15/22 2219 07/16/22 0651 07/16/22 0801 07/16/22 0806  BP: 110/61 115/69    Pulse: 82 93    Resp: 20 20    Temp: 98.6 F (37 C) 99.3 F (37.4 C)    TempSrc: Oral Oral    SpO2: 100% 99% 98% 98%  Weight:      Height:        Intake/Output Summary (Last 24 hours) at 07/16/2022 1042 Last data filed at 07/15/2022 2221 Gross per 24 hour  Intake 200 ml  Output 350 ml  Net -150 ml   Filed Weights   07/14/22 1738 07/15/22 1506  Weight: 60.3 kg 62.8 kg   Examination:  General exam: Appears calm and comfortable  Respiratory system: Clear to auscultation. Respiratory effort normal. Cardiovascular system: normal S1 & S2 heard. No JVD, murmurs, rubs, gallops or clicks. No pedal edema. Gastrointestinal system: Abdomen is nondistended mildly tender in LLQ. No organomegaly or masses felt. Normal bowel sounds heard. Central nervous system: Alert and oriented. No focal neurological deficits. Extremities: Symmetric 5 x 5 power. Skin: No rashes, lesions or ulcers. Psychiatry: Judgement and insight appear poor. Mood & affect appropriate.   Data Reviewed: I have personally reviewed following labs and imaging studies  CBC: Recent Labs  Lab 07/14/22 1809 07/14/22 2255 07/15/22 0459 07/16/22 0438  WBC 4.2 4.7 6.8 5.4  NEUTROABS  --   --  4.2  --   HGB 7.3* 8.0* 7.5* 8.0*  HCT 26.3* 27.2* 26.1* 27.2*  MCV 94.6 92.5 92.9 92.8  PLT 208 223 211 209    Basic Metabolic Panel: Recent Labs  Lab 07/14/22 1809 07/15/22 0459 07/16/22 0438  NA 140 139 140  K 4.9 4.5 4.3  CL 107 110 111  CO2 '26 26 27  '$ GLUCOSE 104* 106* 90  BUN 12 11 7*  CREATININE 1.17 1.18 1.09  CALCIUM 8.2* 8.0* 7.9*  MG  --  2.2  --      CBG: Recent Labs  Lab 07/14/22 1821  GLUCAP 102*    No results found for this or any previous visit (from the past 240 hour(s)).   Radiology Studies: ECHOCARDIOGRAM COMPLETE  Result Date: 07/15/2022    ECHOCARDIOGRAM REPORT   Patient Name:   Gregory Mitchell Date of Exam: 07/15/2022 Medical Rec #:  [id number]      Height:       66.0 in Accession #:    [id number]     Weight:       133.0 lb Date of Birth:  [birth year]      BSA:          1.681 m Patient Age:    33 years       BP:  127/77 mmHg Patient Gender: M              HR:           90 bpm. Exam Location:  [name] Procedure: 2D Echo, Cardiac Doppler and Color Doppler Indications:    Dyspnea R06.00  History:        Patient has prior history of Echocardiogram examinations, most                 recent 12/13/2021. CHF and Cardiomyopathy, CAD, COPD; Risk                 Factors:Hypertension, Diabetes and Current Smoker. Hx of                 COVID-19.  Sonographer:    Alvino Chapel RCS Referring Phys: [id number] ASIA B [place]  1. Left ventricular ejection fraction, by estimation, is 20 to 25%. The left ventricle has severely decreased function. The left ventricle demonstrates global hypokinesis. The left ventricular internal cavity size was moderately dilated. There is mild left ventricular hypertrophy. Left ventricular diastolic parameters are indeterminate.  2. Right ventricular systolic function is normal. The right ventricular size is normal. There is normal pulmonary artery systolic pressure.  3. Left atrial size was severely dilated.  4. Right atrial size was severely dilated.  5. The mitral valve is normal in structure. No evidence of mitral valve regurgitation. No evidence of mitral stenosis.  6. The tricuspid valve is abnormal.  7. The aortic valve is tricuspid. Aortic valve regurgitation is not visualized. No aortic stenosis is present.  8. The inferior vena cava is normal in size with greater than 50% respiratory  variability, suggesting right atrial pressure of 3 mmHg. FINDINGS  Left Ventricle: Global hypokinesis, more prominent hypokinesis of anteroseptal wall. Left ventricular ejection fraction, by estimation, is 20 to 25%. The left ventricle has severely decreased function. The left ventricle demonstrates global hypokinesis.  The left ventricular internal cavity size was moderately dilated. There is mild left ventricular hypertrophy. Left ventricular diastolic parameters are indeterminate. Right Ventricle: The right ventricular size is normal. Right vetricular wall thickness was not well visualized. Right ventricular systolic function is normal. There is normal pulmonary artery systolic pressure. The tricuspid regurgitant velocity is 2.35 m/s, and with an assumed right atrial pressure of 3 mmHg, the estimated right ventricular systolic pressure is 95.6 mmHg. Left Atrium: Left atrial size was severely dilated. Right Atrium: Right atrial size was severely dilated. Pericardium: There is no evidence of pericardial effusion. Mitral Valve: The mitral valve is normal in structure. No evidence of mitral valve regurgitation. No evidence of mitral valve stenosis. Tricuspid Valve: The tricuspid valve is abnormal. Tricuspid valve regurgitation is mild . No evidence of tricuspid stenosis. Aortic Valve: The aortic valve is tricuspid. Aortic valve regurgitation is not visualized. No aortic stenosis is present. Aortic valve mean gradient measures 3.5 mmHg. Aortic valve peak gradient measures 6.2 mmHg. Aortic valve area, by VTI measures 1.72 cm. Pulmonic Valve: The pulmonic valve was not well visualized. Pulmonic valve regurgitation is not visualized. No evidence of pulmonic stenosis. Aorta: The aortic root is normal in size and structure. Venous: The inferior vena cava is normal in size with greater than 50% respiratory variability, suggesting right atrial pressure of 3 mmHg. IAS/Shunts: No atrial level shunt detected by color flow  Doppler.  LEFT VENTRICLE PLAX 2D LVIDd:         6.70 cm      Diastology LVIDs:  6.10 cm      LV e' medial:    5.66 cm/s LV PW:         1.10 cm      LV E/e' medial:  15.5 LV IVS:        1.10 cm      LV e' lateral:   5.55 cm/s LVOT diam:     2.00 cm      LV E/e' lateral: 15.8 LV SV:         43 LV SV Index:   25 LVOT Area:     3.14 cm  LV Volumes (MOD) LV vol d, MOD A2C: 109.0 ml LV vol d, MOD A4C: 160.0 ml LV vol s, MOD A2C: 81.6 ml LV vol s, MOD A4C: 115.0 ml LV SV MOD A2C:     27.4 ml LV SV MOD A4C:     160.0 ml LV SV MOD BP:      38.2 ml RIGHT VENTRICLE RV S prime:     8.38 cm/s TAPSE (M-mode): 2.3 cm LEFT ATRIUM              Index        RIGHT ATRIUM           Index LA diam:        4.30 cm  2.56 cm/m   RA Area:     21.00 cm LA Vol (A2C):   105.0 ml 62.45 ml/m  RA Volume:   60.70 ml  36.10 ml/m LA Vol (A4C):   73.6 ml  43.77 ml/m LA Biplane Vol: 91.5 ml  54.42 ml/m  AORTIC VALVE AV Area (Vmax):    1.78 cm AV Area (Vmean):   1.66 cm AV Area (VTI):     1.72 cm AV Vmax:           124.23 cm/s AV Vmean:          86.671 cm/s AV VTI:            0.247 m AV Peak Grad:      6.2 mmHg AV Mean Grad:      3.5 mmHg LVOT Vmax:         70.25 cm/s LVOT Vmean:        45.850 cm/s LVOT VTI:          0.136 m LVOT/AV VTI ratio: 0.55  AORTA Ao Root diam: 3.70 cm MITRAL VALVE               TRICUSPID VALVE MV Area (PHT): 5.38 cm    TR Peak grad:   22.1 mmHg MV Decel Time: 141 msec    TR Vmax:        235.00 cm/s MV E velocity: 87.70 cm/s                            SHUNTS                            Systemic VTI:  0.14 m                            Systemic Diam: 2.00 cm Carlyle Dolly MD Electronically signed by Carlyle Dolly MD Signature Date/Time: 07/15/2022/1:54:36 PM    Final    DG Chest Portable 1 View  Result Date: 07/14/2022 CLINICAL DATA:  Mid sternal chest pain. EXAM: PORTABLE CHEST  1 VIEW COMPARISON:  05/28/2022 and abdominal CT 05/28/2022 FINDINGS: Chronic densities in the left lung base are compatible with  bronchiectasis from previous CT. Remainder of the lungs are clear without new airspace disease or pulmonary edema. Heart size is upper limits of normal but stable. Atherosclerotic calcifications at the aortic arch. Negative for a pneumothorax. IMPRESSION: Chronic changes at the left lung base. No acute findings. Electronically Signed   By: Markus Daft M.D.   On: 07/14/2022 18:10    Scheduled Meds:  arformoterol  15 mcg Nebulization BID   atorvastatin  80 mg Oral QPM   budesonide  0.25 mg Nebulization BID   irbesartan  75 mg Oral Daily   melatonin  6 mg Oral QHS   metoprolol succinate  25 mg Oral Daily   mirtazapine  15 mg Oral QHS   pantoprazole (PROTONIX) IV  40 mg Intravenous Q12H   polyethylene glycol  17 g Oral BID   Continuous Infusions:   LOS: 0 days   Time spent: 35 mins  Malakai Schoenherr Wynetta Emery, MD How to contact the [facility] Attending or Consulting provider [place] or covering provider during after hours [place], for this patient?  Check the care team in [facility] and look for a) attending/consulting [facility] provider listed and b) the [facility] team listed Log into [email or web address] and use [place]'s universal password to access. If you do not have the password, please contact the hospital operator. Locate the [facility] provider you are looking for under [facility] and page to a number that you can be directly reached. If you still have difficulty reaching the provider, please page the [facility] (Director on Call) for the Hospitalists listed on amion for assistance.  07/16/2022, 10:42 AM

## 2022-07-17 DIAGNOSIS — K219 Gastro-esophageal reflux disease without esophagitis: Secondary | ICD-10-CM | POA: Diagnosis not present

## 2022-07-17 DIAGNOSIS — I10 Essential (primary) hypertension: Secondary | ICD-10-CM | POA: Diagnosis not present

## 2022-07-17 DIAGNOSIS — R1013 Epigastric pain: Secondary | ICD-10-CM | POA: Diagnosis not present

## 2022-07-17 LAB — CBC
HCT: 28.1 % — ABNORMAL LOW (ref 39.0–52.0)
Hemoglobin: 8.1 g/dL — ABNORMAL LOW (ref 13.0–17.0)
MCH: 26.8 pg (ref 26.0–34.0)
MCHC: 28.8 g/dL — ABNORMAL LOW (ref 30.0–36.0)
MCV: 93 fL (ref 80.0–100.0)
Platelets: 263 K/uL (ref 150–400)
RBC: 3.02 MIL/uL — ABNORMAL LOW (ref 4.22–5.81)
RDW: 15.1 % (ref 11.5–15.5)
WBC: 5.7 K/uL (ref 4.0–10.5)
nRBC: 0 % (ref 0.0–0.2)

## 2022-07-17 NOTE — Progress Notes (Signed)
Pt resting in bed.  PRN oxycodone x 2 for abdominal pain with good relief.  No nausea.  Pt reports ABD pain improving since admit.  No dysphagia with fluids/pills.  Ambulatory to bathroom with SBA, cane.  Remains on 3L.  Pt states he is going home today.

## 2022-07-17 NOTE — Discharge Instructions (Signed)
IMPORTANT INFORMATION: PAY CLOSE ATTENTION   PHYSICIAN DISCHARGE INSTRUCTIONS  Follow with Primary care provider  Grabowski, Kristen, FNP  and other consultants as instructed by your Hospitalist Physician  SEEK MEDICAL CARE OR RETURN TO EMERGENCY ROOM IF SYMPTOMS COME BACK, WORSEN OR NEW PROBLEM DEVELOPS   Please note: You were cared for by a hospitalist during your hospital stay. Every effort will be made to forward records to your primary care provider.  You can request that your primary care provider send for your hospital records if they have not received them.  Once you are discharged, your primary care physician will handle any further medical issues. Please note that NO REFILLS for any discharge medications will be authorized once you are discharged, as it is imperative that you return to your primary care physician (or establish a relationship with a primary care physician if you do not have one) for your post hospital discharge needs so that they can reassess your need for medications and monitor your lab values.  Please get a complete blood count and chemistry panel checked by your Primary MD at your next visit, and again as instructed by your Primary MD.  Get Medicines reviewed and adjusted: Please take all your medications with you for your next visit with your Primary MD  Laboratory/radiological data: Please request your Primary MD to go over all hospital tests and procedure/radiological results at the follow up, please ask your primary care provider to get all Hospital records sent to his/her office.  In some cases, they will be blood work, cultures and biopsy results pending at the time of your discharge. Please request that your primary care provider follow up on these results.  If you are diabetic, please bring your blood sugar readings with you to your follow up appointment with primary care.    Please call and make your follow up appointments as soon as possible.    Also  Note the following: If you experience worsening of your admission symptoms, develop shortness of breath, life threatening emergency, suicidal or homicidal thoughts you must seek medical attention immediately by calling 911 or calling your MD immediately  if symptoms less severe.  You must read complete instructions/literature along with all the possible adverse reactions/side effects for all the Medicines you take and that have been prescribed to you. Take any new Medicines after you have completely understood and accpet all the possible adverse reactions/side effects.   Do not drive when taking Pain medications or sleeping medications (Benzodiazepines)  Do not take more than prescribed Pain, Sleep and Anxiety Medications. It is not advisable to combine anxiety,sleep and pain medications without talking with your primary care practitioner  Special Instructions: If you have smoked or chewed Tobacco  in the last 2 yrs please stop smoking, stop any regular Alcohol  and or any Recreational drug use.  Wear Seat belts while driving.  Do not drive if taking any narcotic, mind altering or controlled substances or recreational drugs or alcohol.       

## 2022-07-17 NOTE — Progress Notes (Signed)
Patient wants to leave AMA. Paperwork given explained and signed. Patient verbalized understanding. IV removed. Family on the way to pick patient up.

## 2022-07-17 NOTE — Discharge Summary (Signed)
Physician Discharge Summary  Gregory Mitchell ZOX:[id number] DOB: 12/24/1940 DOA: 07/14/2022  PCP: Beola Cord, FNP  Admit date: 07/14/2022 Discharge date: 07/17/2022  Patient discharged against medical advice   Recommendations for Outpatient Follow-up:  Follow up with PCP in 1 weeks Please follow up with GI in 1 month  Discharge Condition: STABLE   CODE STATUS: FULL DIET: soft foods    Brief Hospitalization Summary: Please see all hospital notes, images, labs for full details of the hospitalization. ADMISSION HPI:  81 y.o. male with medical history significant of asthma, chronic renal insufficiency, COPD, hypertension, history of PE no longer on anticoagulation, and more presents to ED with a chief complaint of chest pain and epigastric pain.  Patient has had chronic chest pain, but reports that this is different because it goes down into his abdomen. Patient reports that it all started with chest tightness. The tightness was intermittent. Using his nebulizer seemed to improve the pain. Sitting down or laying down seemed to make it worse, per the patient. He does wear 3L nasal cannula at home. He noticed himself wheezing, but that was also improved with neb treatments. He has had a productive cough with yellow sputum. He believes his cough is not much different than normal. Patient reports that his epigastric pain feels like a cramping. It is also intermittent. The cramping is worse when he has increased work of breathing. Eating makes his pain worse as well, which has affected his appetite. His last normal meal was 12/5. Patient denies nausea and vomiting. He reports that he has had 2 melenotic stools daily for a "few" days. He has been on anticoagulation in the past - eliquis - but he is not on it any longer. Daughter at bedside thinks he was taken off of it because of GI bleed. Patient has had to be transfused in the past, and is willing to be transfused again. When he last had to be  transfused his only symptom was cold intolerance per the daughter. He did not have chest pain at that time. Patient has no other complaints at this time.    Hospital Course Pt was admitted with abdominal pain and concern for GI bleed with melena.  He was seen by GI service and had EGD on 12/8 with Dr. Gala Romney with findings of:    - Noncritical. Schatzki's ring status post dilation as described evidence of intact fundoplication with protruding sutures; otherwise, normal-appearing stomach, D1 and D2 normal gastroesophageal junction. - Normal duodenal bulb and second portion of the duodenum. He was placed on clear liquid diet with plan to advance diet as tolerated and discharge home.  Unfortunately he decided in AM 12/9 that he was leaving AMA and he had called a ride to pick him up and he left prior to having his diet advanced or seeing GI today.  We were not able to convince him to stay any longer and he was made aware of all risks and he verbalized understanding.   Discharge Diagnoses:  Principal Problem:   Epigastric pain Active Problems:   Chest pain   Essential hypertension   COPD GOLD 3 / obstructive bronchiectasis by HRCT in 2015    GERD (gastroesophageal reflux disease)   Chronic combined systolic and diastolic CHF (congestive heart failure) (HCC)   Elevated troponin   Chronic respiratory failure with hypoxia (HCC)   Acute on chronic anemia   Hypoalbuminemia   Periumbilical pain   Anemia   Constipation due to opioid therapy   Discharge  Instructions:  Allergies as of 07/17/2022   No Known Allergies      Medication List     STOP taking these medications    atorvastatin 80 MG tablet Commonly known as: LIPITOR   lactulose 10 GM/15ML solution Commonly known as: CHRONULAC       TAKE these medications    albuterol (2.5 MG/3ML) 0.083% nebulizer solution Commonly known as: PROVENTIL Take 3 mLs (2.5 mg total) by nebulization every 4 (four) hours as needed for wheezing or  shortness of breath.   albuterol 108 (90 Base) MCG/ACT inhaler Commonly known as: VENTOLIN HFA Inhale into the lungs.   budesonide 0.25 MG/2ML nebulizer solution Commonly known as: Pulmicort Take 2 mLs (0.25 mg total) by nebulization 2 (two) times daily.   dapagliflozin propanediol 10 MG Tabs tablet Commonly known as: Farxiga Take 1 tablet (10 mg total) by mouth daily before breakfast.   guaiFENesin 600 MG 12 hr tablet Commonly known as: Mucinex Take 1 tablet (600 mg total) by mouth 2 (two) times daily.   HM Senna-S 8.6-50 MG tablet Generic drug: senna-docusate Take 2 tablets by mouth daily.   HYDROcodone-acetaminophen 7.5-325 MG tablet Commonly known as: NORCO Take 1 tablet by mouth 2 (two) times daily as needed for moderate pain.   ipratropium-albuterol 0.5-2.5 (3) MG/3ML Soln Commonly known as: DUONEB Take 3 mLs by nebulization 4 (four) times daily.   metoprolol succinate 25 MG 24 hr tablet Commonly known as: Toprol XL Take 1 tablet (25 mg total) by mouth daily.   mirtazapine 15 MG tablet Commonly known as: REMERON Take 15 mg by mouth daily.   OXYGEN Inhale 3 L into the lungs daily as needed (for shortness of breath).   pantoprazole 40 MG tablet Commonly known as: PROTONIX Take 1 tablet (40 mg total) by mouth daily.   tamsulosin 0.4 MG Caps capsule Commonly known as: FLOMAX Take 0.8 mg by mouth 2 (two) times daily.   torsemide 20 MG tablet Commonly known as: DEMADEX Take 1 tablet (20 mg total) by mouth as needed (swelling).   valsartan 80 MG tablet Commonly known as: DIOVAN Take 80 mg by mouth daily.        Follow-up Information     Beola Cord, [place]. Schedule an appointment as soon as possible for a visit in 1 week(s).   Specialty: Family Medicine Why: Hospital Follow Up Contact information: [address] [place] [address] [phone]         [facility]. Schedule an appointment as soon as  possible for a visit in 1 month(s).   Why: Hospital Follow Up Contact information: [address] [place] [phone]               No Known Allergies Allergies as of 07/17/2022   No Known Allergies      Medication List     STOP taking these medications    atorvastatin 80 MG tablet Commonly known as: LIPITOR   lactulose 10 GM/15ML solution Commonly known as: CHRONULAC       TAKE these medications    albuterol (2.5 MG/3ML) 0.083% nebulizer solution Commonly known as: PROVENTIL Take 3 mLs (2.5 mg total) by nebulization every 4 (four) hours as needed for wheezing or shortness of breath.   albuterol 108 (90 Base) MCG/ACT inhaler Commonly known as: VENTOLIN HFA Inhale into the lungs.   budesonide 0.25 MG/2ML nebulizer solution Commonly known as: Pulmicort Take 2 mLs (0.25 mg total) by nebulization 2 (two)  times daily.   dapagliflozin propanediol 10 MG Tabs tablet Commonly known as: Farxiga Take 1 tablet (10 mg total) by mouth daily before breakfast.   guaiFENesin 600 MG 12 hr tablet Commonly known as: Mucinex Take 1 tablet (600 mg total) by mouth 2 (two) times daily.   HM Senna-S 8.6-50 MG tablet Generic drug: senna-docusate Take 2 tablets by mouth daily.   HYDROcodone-acetaminophen 7.5-325 MG tablet Commonly known as: NORCO Take 1 tablet by mouth 2 (two) times daily as needed for moderate pain.   ipratropium-albuterol 0.5-2.5 (3) MG/3ML Soln Commonly known as: DUONEB Take 3 mLs by nebulization 4 (four) times daily.   metoprolol succinate 25 MG 24 hr tablet Commonly known as: Toprol XL Take 1 tablet (25 mg total) by mouth daily.   mirtazapine 15 MG tablet Commonly known as: REMERON Take 15 mg by mouth daily.   OXYGEN Inhale 3 L into the lungs daily as needed (for shortness of breath).   pantoprazole 40 MG tablet Commonly known as: PROTONIX Take 1 tablet (40 mg total) by mouth daily.   tamsulosin 0.4 MG Caps  capsule Commonly known as: FLOMAX Take 0.8 mg by mouth 2 (two) times daily.   torsemide 20 MG tablet Commonly known as: DEMADEX Take 1 tablet (20 mg total) by mouth as needed (swelling).   valsartan 80 MG tablet Commonly known as: DIOVAN Take 80 mg by mouth daily.        Procedures/Studies: ECHOCARDIOGRAM COMPLETE  Result Date: 07/15/2022    ECHOCARDIOGRAM REPORT   Patient Name:   Gregory Mitchell Date of Exam: 07/15/2022 Medical Rec #:  [id number]      Height:       66.0 in Accession #:    [id number]     Weight:       133.0 lb Date of Birth:  1940/10/08      BSA:          1.681 m Patient Age:    5 years       BP:           127/77 mmHg Patient Gender: M              HR:           90 bpm. Exam Location:  [name] Procedure: 2D Echo, Cardiac Doppler and Color Doppler Indications:    Dyspnea R06.00  History:        Patient has prior history of Echocardiogram examinations, most                 recent 12/13/2021. CHF and Cardiomyopathy, CAD, COPD; Risk                 Factors:Hypertension, Diabetes and Current Smoker. Hx of                 COVID-19.  Sonographer:    Alvino Chapel RCS Referring Phys: [id number] ASIA B [place]  1. Left ventricular ejection fraction, by estimation, is 20 to 25%. The left ventricle has severely decreased function. The left ventricle demonstrates global hypokinesis. The left ventricular internal cavity size was moderately dilated. There is mild left ventricular hypertrophy. Left ventricular diastolic parameters are indeterminate.  2. Right ventricular systolic function is normal. The right ventricular size is normal. There is normal pulmonary artery systolic pressure.  3. Left atrial size was severely dilated.  4. Right atrial size was severely dilated.  5. The mitral valve is normal in structure. No evidence of mitral valve  regurgitation. No evidence of mitral stenosis.  6. The tricuspid valve is abnormal.  7. The aortic valve is tricuspid. Aortic valve  regurgitation is not visualized. No aortic stenosis is present.  8. The inferior vena cava is normal in size with greater than 50% respiratory variability, suggesting right atrial pressure of 3 mmHg. FINDINGS  Left Ventricle: Global hypokinesis, more prominent hypokinesis of anteroseptal wall. Left ventricular ejection fraction, by estimation, is 20 to 25%. The left ventricle has severely decreased function. The left ventricle demonstrates global hypokinesis.  The left ventricular internal cavity size was moderately dilated. There is mild left ventricular hypertrophy. Left ventricular diastolic parameters are indeterminate. Right Ventricle: The right ventricular size is normal. Right vetricular wall thickness was not well visualized. Right ventricular systolic function is normal. There is normal pulmonary artery systolic pressure. The tricuspid regurgitant velocity is 2.35 m/s, and with an assumed right atrial pressure of 3 mmHg, the estimated right ventricular systolic pressure is 40.9 mmHg. Left Atrium: Left atrial size was severely dilated. Right Atrium: Right atrial size was severely dilated. Pericardium: There is no evidence of pericardial effusion. Mitral Valve: The mitral valve is normal in structure. No evidence of mitral valve regurgitation. No evidence of mitral valve stenosis. Tricuspid Valve: The tricuspid valve is abnormal. Tricuspid valve regurgitation is mild . No evidence of tricuspid stenosis. Aortic Valve: The aortic valve is tricuspid. Aortic valve regurgitation is not visualized. No aortic stenosis is present. Aortic valve mean gradient measures 3.5 mmHg. Aortic valve peak gradient measures 6.2 mmHg. Aortic valve area, by VTI measures 1.72 cm. Pulmonic Valve: The pulmonic valve was not well visualized. Pulmonic valve regurgitation is not visualized. No evidence of pulmonic stenosis. Aorta: The aortic root is normal in size and structure. Venous: The inferior vena cava is normal in size with  greater than 50% respiratory variability, suggesting right atrial pressure of 3 mmHg. IAS/Shunts: No atrial level shunt detected by color flow Doppler.  LEFT VENTRICLE PLAX 2D LVIDd:         6.70 cm      Diastology LVIDs:         6.10 cm      LV e' medial:    5.66 cm/s LV PW:         1.10 cm      LV E/e' medial:  15.5 LV IVS:        1.10 cm      LV e' lateral:   5.55 cm/s LVOT diam:     2.00 cm      LV E/e' lateral: 15.8 LV SV:         43 LV SV Index:   25 LVOT Area:     3.14 cm  LV Volumes (MOD) LV vol d, MOD A2C: 109.0 ml LV vol d, MOD A4C: 160.0 ml LV vol s, MOD A2C: 81.6 ml LV vol s, MOD A4C: 115.0 ml LV SV MOD A2C:     27.4 ml LV SV MOD A4C:     160.0 ml LV SV MOD BP:      38.2 ml RIGHT VENTRICLE RV S prime:     8.38 cm/s TAPSE (M-mode): 2.3 cm LEFT ATRIUM              Index        RIGHT ATRIUM           Index LA diam:        4.30 cm  2.56 cm/m   RA Area:  21.00 cm LA Vol (A2C):   105.0 ml 62.45 ml/m  RA Volume:   60.70 ml  36.10 ml/m LA Vol (A4C):   73.6 ml  43.77 ml/m LA Biplane Vol: 91.5 ml  54.42 ml/m  AORTIC VALVE AV Area (Vmax):    1.78 cm AV Area (Vmean):   1.66 cm AV Area (VTI):     1.72 cm AV Vmax:           124.23 cm/s AV Vmean:          86.671 cm/s AV VTI:            0.247 m AV Peak Grad:      6.2 mmHg AV Mean Grad:      3.5 mmHg LVOT Vmax:         70.25 cm/s LVOT Vmean:        45.850 cm/s LVOT VTI:          0.136 m LVOT/AV VTI ratio: 0.55  AORTA Ao Root diam: 3.70 cm MITRAL VALVE               TRICUSPID VALVE MV Area (PHT): 5.38 cm    TR Peak grad:   22.1 mmHg MV Decel Time: 141 msec    TR Vmax:        235.00 cm/s MV E velocity: 87.70 cm/s                            SHUNTS                            Systemic VTI:  0.14 m                            Systemic Diam: 2.00 cm Carlyle Dolly MD Electronically signed by Carlyle Dolly MD Signature Date/Time: 07/15/2022/1:54:36 PM    Final    DG Chest Portable 1 View  Result Date: 07/14/2022 CLINICAL DATA:  Mid sternal chest pain. EXAM:  PORTABLE CHEST 1 VIEW COMPARISON:  05/28/2022 and abdominal CT 05/28/2022 FINDINGS: Chronic densities in the left lung base are compatible with bronchiectasis from previous CT. Remainder of the lungs are clear without new airspace disease or pulmonary edema. Heart size is upper limits of normal but stable. Atherosclerotic calcifications at the aortic arch. Negative for a pneumothorax. IMPRESSION: Chronic changes at the left lung base. No acute findings. Electronically Signed   By: Markus Daft M.D.   On: 07/14/2022 18:10     Subjective: Pt says that he called his ride and they are picking him up and he is leaving now.   Discharge Exam: Vitals:   07/17/22 0717 07/17/22 0722  BP:    Pulse:    Resp:    Temp:    SpO2: 100% 100%   Vitals:   07/16/22 2213 07/17/22 0351 07/17/22 0717 07/17/22 0722  BP: (!) 90/58 (!) 104/59    Pulse: 89 90    Resp: 19 20    Temp: 98.5 F (36.9 C) 98.7 F (37.1 C)    TempSrc: Oral Oral    SpO2: 100% 99% 100% 100%  Weight:      Height:       General: Pt is alert, awake, oriented x 3, not in acute distress Cardiovascular: RRR, S1/S2 +, no rubs, no gallops Respiratory: CTA bilaterally, no wheezing, no rhonchi Abdominal: Soft, NT, ND,  bowel sounds + Extremities: no edema, no cyanosis   The results of significant diagnostics from this hospitalization (including imaging, microbiology, ancillary and laboratory) are listed below for reference.     Microbiology: No results found for this or any previous visit (from the past 240 hour(s)).   Labs: BNP (last 3 results) Recent Labs    04/22/22 1128 05/28/22 1728 07/14/22 1809  BNP 202.0* 746.0* 130.8*   Basic Metabolic Panel: Recent Labs  Lab 07/14/22 1809 07/15/22 0459 07/16/22 0438  NA 140 139 140  K 4.9 4.5 4.3  CL 107 110 111  CO2 '26 26 27  '$ GLUCOSE 104* 106* 90  BUN 12 11 7*  CREATININE 1.17 1.18 1.09  CALCIUM 8.2* 8.0* 7.9*  MG  --  2.2  --    Liver Function Tests: Recent Labs  Lab  07/14/22 1809 07/15/22 0459  AST 17 15  ALT 9 10  ALKPHOS 59 65  BILITOT 0.3 0.1*  PROT 6.2* 5.7*  ALBUMIN 2.7* 2.5*   Recent Labs  Lab 07/14/22 1809  LIPASE 25   No results for input(s): "AMMONIA" in the last 168 hours. CBC: Recent Labs  Lab 07/14/22 1809 07/14/22 2255 07/15/22 0459 07/16/22 0438 07/17/22 0342  WBC 4.2 4.7 6.8 5.4 5.7  NEUTROABS  --   --  4.2  --   --   HGB 7.3* 8.0* 7.5* 8.0* 8.1*  HCT 26.3* 27.2* 26.1* 27.2* 28.1*  MCV 94.6 92.5 92.9 92.8 93.0  PLT 208 223 211 258 263   Cardiac Enzymes: No results for input(s): "CKTOTAL", "CKMB", "CKMBINDEX", "TROPONINI" in the last 168 hours. BNP: Invalid input(s): "POCBNP" CBG: Recent Labs  Lab 07/14/22 1821 07/16/22 1148 07/16/22 1219 07/16/22 1513  GLUCAP 102* 77 147* 91   D-Dimer No results for input(s): "DDIMER" in the last 72 hours. Hgb A1c No results for input(s): "HGBA1C" in the last 72 hours. Lipid Profile No results for input(s): "CHOL", "HDL", "LDLCALC", "TRIG", "CHOLHDL", "LDLDIRECT" in the last 72 hours. Thyroid function studies No results for input(s): "TSH", "T4TOTAL", "T3FREE", "THYROIDAB" in the last 72 hours.  Invalid input(s): "FREET3" Anemia work up No results for input(s): "VITAMINB12", "FOLATE", "FERRITIN", "TIBC", "IRON", "RETICCTPCT" in the last 72 hours. Urinalysis    Component Value Date/Time   COLORURINE YELLOW 05/28/2022 2235   APPEARANCEUR CLEAR 05/28/2022 2235   LABSPEC 1.014 05/28/2022 2235   PHURINE 6.0 05/28/2022 2235   GLUCOSEU NEGATIVE 05/28/2022 2235   HGBUR NEGATIVE 05/28/2022 2235   BILIRUBINUR NEGATIVE 05/28/2022 2235   KETONESUR NEGATIVE 05/28/2022 2235   PROTEINUR NEGATIVE 05/28/2022 2235   NITRITE NEGATIVE 05/28/2022 [place] 05/28/2022 2235   Sepsis Labs Recent Labs  Lab 07/14/22 2255 07/15/22 0459 07/16/22 0438 07/17/22 0342  WBC 4.7 6.8 5.4 5.7   Microbiology No results found for this or any previous visit (from the  past 240 hour(s)).  Time coordinating discharge:   SIGNED:  Irwin Brakeman, MD  [facility] 07/17/2022, 9:51 AM How to contact the [facility] Attending or Consulting provider [place] or covering provider during after hours [place], for this patient?  Check the care team in [facility] and look for a) attending/consulting [facility] provider listed and b) the [facility] team listed Log into [email or web address] and use [place]'s universal password to access. If you do not have the password, please contact the hospital operator. Locate the [facility] provider you are looking for under [facility] and page to a number that you can be directly reached.  If you still have difficulty reaching the provider, please page the [facility] (Director on Call) for the Hospitalists listed on amion for assistance.

## 2022-07-26 ENCOUNTER — Encounter (HOSPITAL_COMMUNITY): Payer: Self-pay | Admitting: Internal Medicine

## 2022-07-26 ENCOUNTER — Ambulatory Visit: Payer: Medicare HMO | Admitting: Cardiology

## 2022-07-26 NOTE — Progress Notes (Deleted)
Clinical Summary Gregory Mitchell is a 81 y.o.male  seen today for follow up of the following medical problems.    1. Chronic systolic HF - LVEF previously as low as 35-40%, repeat echo showed improvement to 45-50% in 04/2013. He has had a prior cath 11/2011 that showed non-obstructive disease  - 05/2016 echo LVEF 67-89%, grade I diastolic dysfunction   - Jan 2021 admission with volume overload - Jan 2021 echo LVEF 25-30%, grade II DDX, ,normal RV, mild MR, - Jan 2021 cath: normal coronaries. CI 3, mean PA 17, PCWP 17, LVEDP 13    09/2020 echo LVEF 20-25%, grade III dd, normal RV    -followed in CHF clinic, though difficult transportation and will f/u with them as needed        02/2021 echo LVEF 30-35%, mild RV dysfunction CHF clnic had recommended cMRI   - recent increase in LE edema.  - weights hard to trend, had lost a significant amount of wait this year due to failure to thrive. In 10/18/19 he was 158 lbs. At last cards visit 12/30/20 was 128 lbs - 03/02/21 wt was 145 lbs - recent LE edema. Taking lasix '40mg'$  bid, with mild improvement       - some recent leg swelling - taking torsemide '20mg'$  prn, takes roughly 4-5 times per week.  - home weight 145 lbs and stable -pulmony had suggested ARB over entresto due to COPD, now off.  - bp to day 92/50.       Spoke with pulmonary Dr Melvyn Novas, felt that the change from entresto to ARB has not really helped from pulmonary standpoint and would be ok restarting entresto. Patient admitted with GI bleeding, we will consider change at our f/u 12/18 as outpatient.      2. COPD - tobacco x 20 years, quit 40 years ago.    - followed by Dr Koleen Nimrod. - 03/02/2021 seen by [facility]   02/16/2021 ER visit, CXR possible pneumonia. Started on steroids and abx  -typically has chest pain in setting of his lung disease exacerbations 02/2021 CXR patchy bibasilar airspace opacities, atelectasis vs pneumonia     3. HTN - compliant with meds      4. HL - muscle aches on statin prevoiusly  - currently diet controlled     5. 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 By history appears unprovoked.    - off eliquis due anemia - 4/4 Hgb 8.7, down from 11.5. Stopped by pcp       6. Epigastric pain/Chest pain - long history of atypical chest pain - cath 2013 2021 with nonobstructive disease. Stress tests 2016 and 2017 no ischemia -often in the past have resovled with treatment of his COPD with nebs and steroids - also with prior candida esophagitis by EGD, symptoms improved with diflucan. GI is trying a repeat course.    - tightness midchest. Can occur at rest or with activity. 10/10 in severity. Not positional. Can last 2-3 hours, can last all day. Worst with eating.  - better with fluid pill. Can be better with nebulizer     7.Anemia - admit 07/2022 with anemia, Hgb 7.3 on admit - overall benign EGD - left AMA prior to completion workup Past Medical History:  Diagnosis Date   Asthma    Chronic renal insufficiency    COPD (chronic obstructive pulmonary disease) ([place])    Heart failure ([place])  a. EF 25-30% in 08/2019 with cath showing normal cors b. EF 20-25% in 09/2020 c. 30-35% in 02/2021 d. EF 20-25% in 10/2021   Hypertension    Pulmonary emboli ([place]) 03/2019   Renal insufficiency    Type 2 diabetes mellitus ([facility])      No Known Allergies   Current Outpatient Medications  Medication Sig Dispense Refill   albuterol (PROVENTIL) (2.5 MG/3ML) 0.083% nebulizer solution Take 3 mLs (2.5 mg total) by nebulization every 4 (four) hours as needed for wheezing or shortness of breath. 75 mL 2   albuterol (VENTOLIN HFA) 108 (90 Base) MCG/ACT inhaler Inhale into the lungs.     budesonide (PULMICORT) 0.25 MG/2ML nebulizer solution Take 2 mLs (0.25 mg total) by nebulization 2 (two) times daily. 60 mL 12   dapagliflozin propanediol (FARXIGA) 10 MG TABS tablet Take 1 tablet (10 mg  total) by mouth daily before breakfast. 100 tablet 3   guaiFENesin (MUCINEX) 600 MG 12 hr tablet Take 1 tablet (600 mg total) by mouth 2 (two) times daily. 60 tablet 2   HM SENNA-S 8.6-50 MG tablet Take 2 tablets by mouth daily.     HYDROcodone-acetaminophen (NORCO) 7.5-325 MG tablet Take 1 tablet by mouth 2 (two) times daily as needed for moderate pain.     ipratropium-albuterol (DUONEB) 0.5-2.5 (3) MG/3ML SOLN Take 3 mLs by nebulization 4 (four) times daily.     metoprolol succinate (TOPROL XL) 25 MG 24 hr tablet Take 1 tablet (25 mg total) by mouth daily. 90 tablet 3   mirtazapine (REMERON) 15 MG tablet Take 15 mg by mouth daily.     OXYGEN Inhale 3 L into the lungs daily as needed (for shortness of breath).     pantoprazole (PROTONIX) 40 MG tablet Take 1 tablet (40 mg total) by mouth daily. 60 tablet 3   tamsulosin (FLOMAX) 0.4 MG CAPS capsule Take 0.8 mg by mouth 2 (two) times daily.     torsemide (DEMADEX) 20 MG tablet Take 1 tablet (20 mg total) by mouth as needed (swelling).     valsartan (DIOVAN) 80 MG tablet Take 80 mg by mouth daily.     No current facility-administered medications for this visit.     Past Surgical History:  Procedure Laterality Date   APPENDECTOMY     BALLOON DILATION N/A 03/18/2020   Procedure: BALLOON DILATION;  Surgeon: Eloise Harman, DO;  [facility];  Service: Endoscopy;  Laterality: N/A;   CHOLECYSTECTOMY     COLONOSCOPY  10/2006   Dr.Anwar:normal   ESOPHAGEAL BRUSHING  08/24/2021   Procedure: ESOPHAGEAL BRUSHING;  Surgeon: Eloise Harman, DO;  [facility]: [facility];  Service: Endoscopy;;   ESOPHAGOGASTRODUODENOSCOPY (EGD) WITH PROPOFOL N/A 03/18/2020   Procedure: ESOPHAGOGASTRODUODENOSCOPY (EGD) WITH PROPOFOL;  Surgeon: Eloise Harman, DO;  [facility];  Service: Endoscopy;  Laterality: N/A;  2:30pm   ESOPHAGOGASTRODUODENOSCOPY (EGD) WITH PROPOFOL N/A 08/24/2021   Procedure: ESOPHAGOGASTRODUODENOSCOPY (EGD) WITH  PROPOFOL;  Surgeon: Eloise Harman, DO;  [facility];  Service: Endoscopy;  Laterality: N/A;  with dilation   ESOPHAGOGASTRODUODENOSCOPY (EGD) WITH PROPOFOL N/A 07/16/2022   Procedure: ESOPHAGOGASTRODUODENOSCOPY (EGD) WITH PROPOFOL;  Surgeon: Daneil Dolin, MD;  [facility];  Service: Endoscopy;  Laterality: N/A;   FLEXIBLE SIGMOIDOSCOPY N/A 08/24/2021   Procedure: FLEXIBLE SIGMOIDOSCOPY;  Surgeon: Eloise Harman, DO;  [facility];  Service: Endoscopy;  Laterality: N/A;   MALONEY DILATION  07/16/2022   Procedure: Venia Minks  DILATION;  Surgeon: Daneil Dolin, MD;  [facility];  Service: Endoscopy;;   NASAL SINUS SURGERY     NEPHRECTOMY     RIGHT (Question CA.  No further therapy needed.)   RIGHT/LEFT HEART CATH AND CORONARY ANGIOGRAPHY N/A 09/03/2019   Procedure: RIGHT/LEFT HEART CATH AND CORONARY ANGIOGRAPHY;  Surgeon: Lorretta Harp, MD;  Location: [facility];  Service: Cardiovascular;  Laterality: N/A;     No Known Allergies    Family History  Problem Relation Age of Onset   Stroke Mother    Breast cancer Mother    Lung cancer Brother    Colon cancer Neg Hx      Social History Gregory Mitchell reports that he quit smoking about 45 years ago. His smoking use included cigarettes. He started smoking about 65 years ago. He has a 15.00 pack-year smoking history. He quit smokeless tobacco use about 16 years ago.  His smokeless tobacco use included chew. Gregory Mitchell reports that he does not currently use alcohol.   Review of Systems CONSTITUTIONAL: No weight loss, fever, chills, weakness or fatigue.  HEENT: Eyes: No visual loss, blurred vision, double vision or yellow sclerae.No hearing loss, sneezing, congestion, runny nose or sore throat.  SKIN: No rash or itching.  CARDIOVASCULAR:  RESPIRATORY: No shortness of breath, cough or sputum.  GASTROINTESTINAL: No anorexia, nausea, vomiting or diarrhea. No abdominal pain or  blood.  GENITOURINARY: No burning on urination, no polyuria NEUROLOGICAL: No headache, dizziness, syncope, paralysis, ataxia, numbness or tingling in the extremities. No change in bowel or bladder control.  MUSCULOSKELETAL: No muscle, back pain, joint pain or stiffness.  LYMPHATICS: No enlarged nodes. No history of splenectomy.  PSYCHIATRIC: No history of depression or anxiety.  ENDOCRINOLOGIC: No reports of sweating, cold or heat intolerance. No polyuria or polydipsia.  [name]   Physical Examination There were no vitals filed for this visit. There were no vitals filed for this visit.  Gen: resting comfortably, no acute distress HEENT: no scleral icterus, pupils equal round and reactive, no palptable cervical adenopathy,  CV Resp: Clear to auscultation bilaterally GI: abdomen is soft, non-tender, non-distended, normal bowel sounds, no hepatosplenomegaly MSK: extremities are warm, no edema.  Skin: warm, no rash Neuro:  no focal deficits Psych: appropriate affect   Diagnostic Studies  04/2013 echo Study Conclusions  - Left ventricle: The cavity size was mildly dilated. Wall   thickness was increased in a pattern of mild to   moderateLVH. Systolic function was mildly reduced. The   estimated ejection fraction was in the range of 45% to   50%. There is mild global hypokinesis. There was an   increased relative contribution of atrial contraction to   ventricular filling. Doppler parameters are consistent   with abnormal left ventricular relaxation (grade 1   diastolic dysfunction). Doppler parameters are consistent   with high ventricular filling pressure. - Mitral valve: Calcified annulus. Mildly thickened leaflets   . Trivial to mild regurgitation. - Left atrium: The atrium was mildly dilated. - Right ventricle: The cavity size was mildly dilated. Wall   thickness was normal. Systolic function was mildly   reduced. - Right atrium: The atrium was mildly dilated. - Pericardium,  extracardiac: A trivial pericardial effusion   was identified.     11/2011 Cath Procedural Findings:               Hemodynamics:  AO 157/77                                     LV 154/22              Coronary angiography:   Coronary dominance: Right   Left mainstem:   Normal   Left anterior descending (LAD):  Large vessel wrapping the apex.   Mild lumina irregularities.  D1 small normal.  D2 small normal.   Left circumflex (LCx):  AV group luminal irregularities.  OM1 large and normal.  PL x 2 small normal   Right coronary artery (RCA):  Dominant.  Long mid 25%.  PDA normal.  PL moderate and normal.      Left ventriculography:  Left ventricle not injected   Final Conclusions:  Mild coronary plaque.  Non ischemic cardiomyopathy   Recommendations:   Medical management.          12/2014 Exercise Cardiolite IMPRESSION: 1. Moderate size region of inferior wall myocardial scar. No ischemic territories seen. Overlying soft tissue attenuation artifact cannot entirely be ruled out.   2. Mild inferior wall hypokinesis.   3. Left ventricular ejection fraction 48%   4.  Low to intermediate-risk stress test findings*.     05/2016 echo Study Conclusions   - Left ventricle: The cavity size was normal. Wall thickness was   increased in a pattern of mild LVH. Systolic function was normal.   The estimated ejection fraction was in the range of 55% to 60%.   Wall motion was normal; there were no regional wall motion   abnormalities. Doppler parameters are consistent with abnormal   left ventricular relaxation (grade 1 diastolic dysfunction). - Aortic valve: Mildly calcified annulus. Trileaflet; mildly   thickened leaflets. Valve area (VTI): 4.03 cm^2. Valve area   (Vmax): 4.25 cm^2. Valve area (Vmean): 4.49 cm^2. - Left atrium: The atrium was mildly to moderately dilated. - Atrial septum: No defect or patent foramen ovale was identified. -  Technically adequate study.     Jan 2021 echo   IMPRESSIONS     1. Left ventricular ejection fraction, by visual estimation, is 25 to  30%. The left ventricle has severely decreased function. There is no left  ventricular hypertrophy.   2. Elevated left ventricular end-diastolic pressure.   3. Left ventricular diastolic parameters are consistent with Grade II  diastolic dysfunction (pseudonormalization).   4. Mild to moderately dilated left ventricular internal cavity size.   5. The left ventricle demonstrates global hypokinesis.   6. Global right ventricle has normal systolic function.The right  ventricular size is normal. Mildly increased right ventricular wall  thickness.   7. Left atrial size was severely dilated.   8. Right atrial size was moderately dilated.   9. The mitral valve is grossly normal. Mild mitral valve regurgitation.  10. The tricuspid valve is grossly normal.  11. The tricuspid valve is grossly normal. Tricuspid valve regurgitation  is mild.  12. The aortic valve is tricuspid. Aortic valve regurgitation is not  visualized. No evidence of aortic valve sclerosis or stenosis.  13. The pulmonic valve was grossly normal. Pulmonic valve regurgitation is  not visualized.  14. The inferior vena cava is normal in size with greater than 50%  respiratory variability, suggesting right atrial pressure of 3 mmHg.  Jan 2021 cath History obtained from chart review.Kennett WENZLICK is a 81 y.o. male with a hx  of longstanding chest pain with nonobstructive coronary artery disease by cardiac catheterization in 4742, chronic systolic heart failure with evidence of improved LV function in the past (secondary to nonischemic cardiomyopathy), COPD, hypertension, hyperlipidemia, prior pulmonary embolism, chronic kidney disease and diabetes who is being seen today for the evaluation of decompensated heart failure in the setting of reduced LV function and elevated troponin at the request of  Dr. Dyann Kief.     IMPRESSION: Mr. Feltus has normal coronary arteries and low filling pressures suggesting that he has been adequately or over diuresed.  He did have a high V waves suggesting that he potentially has significant mitral regurgitation.  His systolic pressures were in the mid to high 90s and I therefore gave him a 250 cc bolus of saline given his low LVEDP and wedge.  He will need guideline directed optimal medical therapy for his LV dysfunction.  The sheath removed and a TR band was placed on the right wrist to achieve patent hemostasis.  The patient left lab in stable condition.  His renal function be carefully monitored.  Dr. Davina Poke was notified of these results.     09/2020 echo IMPRESSIONS     1. Left ventricular ejection fraction, by estimation, is 20 to 25%. The  left ventricle has severely decreased function. The left ventricle  demonstrates global hypokinesis. The left ventricular internal cavity size  was severely dilated. Left ventricular  diastolic parameters are consistent with Grade III diastolic dysfunction  (restrictive).   2. Right ventricular systolic function is normal. The right ventricular  size is normal. There is normal pulmonary artery systolic pressure.   3. Left atrial size was mildly dilated.   4. The mitral valve is normal in structure. Trivial mitral valve  regurgitation. No evidence of mitral stenosis.   5. The aortic valve is tricuspid. Aortic valve regurgitation is not  visualized. No aortic stenosis is present.   6. The inferior vena cava is normal in size with greater than 50%  respiratory variability, suggesting right atrial pressure of 3 mmHg.    02/2021 echo IMPRESSIONS     1. Diffuse hypokinesis with abnormal septal motion . Left ventricular  ejection fraction, by estimation, is 30 to 35%. The left ventricle has  moderately decreased function. The left ventricle demonstrates global  hypokinesis. The left ventricular internal   cavity  size was moderately to severely dilated. Left ventricular  diastolic parameters were normal. The average left ventricular global  longitudinal strain is -13.3 %. The global longitudinal strain is  abnormal.   2. Right ventricular systolic function is mildly reduced. The right  ventricular size is normal.   3. Right atrial size was mildly dilated.   4. The mitral valve is normal in structure. Trivial mitral valve  regurgitation. No evidence of mitral stenosis.   5. The aortic valve is normal in structure. Aortic valve regurgitation is  not visualized. No aortic stenosis is present.   6. The inferior vena cava is normal in size with greater than 50%  respiratory variability, suggesting right atrial pressure of 3 mmHg.      Assessment and Plan   Chronic systolic HF - some ongoing edema - has only been taking his torsemide prn. Will take next 3 days then resume prn. Check bmet/mg/bnp, pending result smay have him start taking torsemide daily - entresto changed to valsartan after talks with pulmonary about his COPD - soft bp's, not on aldactone currently     2. Chest pain - long history  atypical symptoms, cath 2021 without disease - symptoms improve with diuretic, will increase usage. Also worst with food, he has up coming endoscopy   3. Preop eval - ok for GI procedures from cardiac standpoint.         Arnoldo Lenis, M.D.

## 2022-08-11 ENCOUNTER — Ambulatory Visit: Payer: Medicare HMO | Admitting: Nurse Practitioner

## 2022-08-17 ENCOUNTER — Ambulatory Visit: Payer: Medicare HMO | Admitting: Nurse Practitioner

## 2022-08-19 ENCOUNTER — Encounter: Payer: Self-pay | Admitting: Nurse Practitioner

## 2022-08-19 ENCOUNTER — Ambulatory Visit: Payer: Medicare HMO | Attending: Cardiology | Admitting: Nurse Practitioner

## 2022-08-19 VITALS — BP 126/60 | HR 80 | Ht 66.0 in | Wt 140.6 lb

## 2022-08-19 DIAGNOSIS — R0602 Shortness of breath: Secondary | ICD-10-CM | POA: Diagnosis not present

## 2022-08-19 DIAGNOSIS — I5022 Chronic systolic (congestive) heart failure: Secondary | ICD-10-CM

## 2022-08-19 DIAGNOSIS — R600 Localized edema: Secondary | ICD-10-CM

## 2022-08-19 DIAGNOSIS — I10 Essential (primary) hypertension: Secondary | ICD-10-CM

## 2022-08-19 DIAGNOSIS — R079 Chest pain, unspecified: Secondary | ICD-10-CM | POA: Diagnosis not present

## 2022-08-19 DIAGNOSIS — Z79899 Other long term (current) drug therapy: Secondary | ICD-10-CM

## 2022-08-19 DIAGNOSIS — R0989 Other specified symptoms and signs involving the circulatory and respiratory systems: Secondary | ICD-10-CM

## 2022-08-19 NOTE — Progress Notes (Signed)
Cardiology Office Note:    Date:  08/19/2022   [name], DOB May 31, 1941, MRN [id number]  PCP:  Neale Burly, MD   [facility] Cardiologist:  Carlyle Dolly, MD     Referring MD: Beola Cord, FNP   CC: Here for follow-up  History of Present Illness:    Gregory Mitchell is a 82 y.o. male with a hx of the following:  Chronic systolic CHF, leg edema Hypertension Hyperlipidemia History of PE History of chest pain COPD, wears 3 L of O2 at night Former smoker Hx of epigastric pain and melena   Patient is a 82 year old male with past medical history as mentioned above.  Previous cardiovascular history includes reduced ejection fraction with improvement in 2014.  History of cardiac catheterization in 2013 that showed nonobstructive CAD.  Echocardiogram in 2017 showed EF 55 to 60%, grade 1 DD.  Was admitted in 2021 with volume overload.  Echocardiogram at that time revealed EF reduced to 25 to 30%, grade 2 DD, mild MR and normal RV.  Cardiac catheterization revealed normal coronary arteries, mean PA 17, PCWP 17, LVEDP 13, and CI 3.  Diagnosed with bilateral PEs during [facility] admission in 2020, appears unprovoked.  No prior history of blood clots.  Echocardiogram in 2022 showed EF 20 to 25%, grade 3 DD, normal RV.  Has been followed in CHF clinic though has difficulty with transportation.  Echo 02/2021 showed EF 30 to 35%, mild RV dysfunction.  CHF clinic recommended cardiac MRI.  Last seen by Dr. Carlyle Dolly on March 23, 2022.  He was taking torsemide as needed.  Due to his ongoing edema, was advised to take torsemide for the next 3 days and then to resume as needed.  Entresto was changed to valsartan.  Because his blood pressures were soft, he was not on Aldactone at the time.  Had a long history of atypical symptoms of chest pain.  Described as a tightness in the mid chest, can occur at rest or with activity, 10 out of 10 in severity, not  positional, worse with eating, better with fluid pill and with nebulizer, was unable to determine how long it lasted in duration.  He was pending GI endoscopic, and Dr. Harl Bowie stated was okay for clearance from a cardiac standpoint.  Was told to follow-up in 4 months.  Was admitted in December 2023 with abdominal pain and concern for GI bleed with melena.  EGD on July 16, 2022 with findings of Schatzki's ring s/p dilation, intact dundoplication with protruding sutures, otherwise normal-appearing stomach.  Was placed on clear liquid diet with plan to advance to advance diet as tolerated and discharge home.  He left AMA on July 17, 2022.  Our office was contacted in December, and Dr. Harl Bowie spoke with pulmonologist who felt that it was best to go from [facility] back to [facility].    Today he presents for follow-up with his daughter.  He states he is not doing well. CC is weakness, shortness of breath, feet/ankle swelling, and ongoing chest pain.  Describes chest pain as in the right middle side of his chest, does move from right side to middle chest, described as tightness, been ongoing for months.  Made better when laying down, worse with eating or drinking.  Similar type of chest pain he had when he saw Dr. Carlyle Dolly in August last year.  He did undergo endoscopy and had esophageal dilatation performed.  Daughter presents concern that patient's  systolic blood pressure at home is low (< 100), rechecks this to make sure. Pt admits to staying well hydrated.  Patient denies any palpitations, orthopnea, PND, significant weight changes, acute bleeding, or claudication.  Denies any other questions or concerns today.   Past Medical History:  Diagnosis Date   Asthma    Chronic renal insufficiency    COPD (chronic obstructive pulmonary disease) (HCC)    Heart failure ([place])    a. EF 25-30% in 08/2019 with cath showing normal cors b. EF 20-25% in 09/2020 c. 30-35% in 02/2021 d. EF 20-25% in 10/2021    Hypertension    Pulmonary emboli ([place]) 03/2019   Renal insufficiency    Type 2 diabetes mellitus ([place])     Past Surgical History:  Procedure Laterality Date   APPENDECTOMY     BALLOON DILATION N/A 03/18/2020   Procedure: BALLOON DILATION;  Surgeon: Eloise Harman, DO;  [facility];  Service: Endoscopy;  Laterality: N/A;   CHOLECYSTECTOMY     COLONOSCOPY  10/2006   Dr.Anwar:normal   ESOPHAGEAL BRUSHING  08/24/2021   Procedure: ESOPHAGEAL BRUSHING;  Surgeon: Eloise Harman, DO;  [facility];  Service: Endoscopy;;   ESOPHAGOGASTRODUODENOSCOPY (EGD) WITH PROPOFOL N/A 03/18/2020   Procedure: ESOPHAGOGASTRODUODENOSCOPY (EGD) WITH PROPOFOL;  Surgeon: Eloise Harman, DO;  [facility];  Service: Endoscopy;  Laterality: N/A;  2:30pm   ESOPHAGOGASTRODUODENOSCOPY (EGD) WITH PROPOFOL N/A 08/24/2021   Procedure: ESOPHAGOGASTRODUODENOSCOPY (EGD) WITH PROPOFOL;  Surgeon: Eloise Harman, DO;  [facility];  Service: Endoscopy;  Laterality: N/A;  with dilation   ESOPHAGOGASTRODUODENOSCOPY (EGD) WITH PROPOFOL N/A 07/16/2022   Procedure: ESOPHAGOGASTRODUODENOSCOPY (EGD) WITH PROPOFOL;  Surgeon: Daneil Dolin, MD;  [facility];  Service: Endoscopy;  Laterality: N/A;   FLEXIBLE SIGMOIDOSCOPY N/A 08/24/2021   Procedure: FLEXIBLE SIGMOIDOSCOPY;  Surgeon: Eloise Harman, DO;  [facility];  Service: Endoscopy;  Laterality: N/A;   MALONEY DILATION  07/16/2022   Procedure: Venia Minks DILATION;  Surgeon: Daneil Dolin, MD;  [facility];  Service: Endoscopy;;   NASAL SINUS SURGERY     NEPHRECTOMY     RIGHT (Question CA.  No further therapy needed.)   RIGHT/LEFT HEART CATH AND CORONARY ANGIOGRAPHY N/A 09/03/2019   Procedure: RIGHT/LEFT HEART CATH AND CORONARY ANGIOGRAPHY;  Surgeon: Lorretta Harp, MD;  Location: [facility];  Service: Cardiovascular;  Laterality: N/A;    Current Medications: Current Meds   Medication Sig   albuterol (PROVENTIL) (2.5 MG/3ML) 0.083% nebulizer solution Take 3 mLs (2.5 mg total) by nebulization every 4 (four) hours as needed for wheezing or shortness of breath.   albuterol (VENTOLIN HFA) 108 (90 Base) MCG/ACT inhaler Inhale into the lungs.   atorvastatin (LIPITOR) 40 MG tablet Take 1 tablet by mouth daily.   budesonide (PULMICORT) 0.25 MG/2ML nebulizer solution Take 2 mLs (0.25 mg total) by nebulization 2 (two) times daily.   dapagliflozin propanediol (FARXIGA) 10 MG TABS tablet Take 1 tablet (10 mg total) by mouth daily before breakfast.   famotidine (PEPCID) 40 MG tablet Take 1 tablet by mouth daily.   guaiFENesin (MUCINEX) 600 MG 12 hr tablet Take 1 tablet (600 mg total) by mouth 2 (two) times daily.   HM SENNA-S 8.6-50 MG tablet Take 2 tablets by mouth daily.   HYDROcodone-acetaminophen (NORCO) 7.5-325 MG tablet Take 1 tablet by mouth 2 (two) times daily as needed for moderate pain.   ipratropium-albuterol (DUONEB) 0.5-2.5 (3)  MG/3ML SOLN Take 3 mLs by nebulization 4 (four) times daily.   metoprolol succinate (TOPROL XL) 25 MG 24 hr tablet Take 1 tablet (25 mg total) by mouth daily.   mirtazapine (REMERON) 15 MG tablet Take 15 mg by mouth daily.   OXYGEN Inhale 3 L into the lungs daily as needed (for shortness of breath).   pantoprazole (PROTONIX) 40 MG tablet Take 1 tablet (40 mg total) by mouth daily.   tamsulosin (FLOMAX) 0.4 MG CAPS capsule Take 0.8 mg by mouth 2 (two) times daily.   torsemide (DEMADEX) 20 MG tablet Take 1 tablet (20 mg total) by mouth as needed (swelling). (Patient taking differently: Take 20 mg by mouth daily as needed (swelling).)   valsartan (DIOVAN) 80 MG tablet Take 80 mg by mouth daily.     Allergies:   Patient has no known allergies.   Social History   Socioeconomic History   Marital status: Widowed    Spouse name: Not on file   Number of children: Not on file   Years of education: Not on file   Highest education level: Not  on file  Occupational History   Not on file  Tobacco Use   Smoking status: Former    Packs/day: 1.00    Years: 15.00    Total pack years: 15.00    Types: Cigarettes    Start date: 07/14/1957    Quit date: 08/09/1976    Years since quitting: 46.0   Smokeless tobacco: Former    Types: Chew    Quit date: 02/10/2006   Tobacco comments:    chewed tobacco for 10 years  Vaping Use   Vaping Use: Never used  Substance and Sexual Activity   Alcohol use: Not Currently    Alcohol/week: 0.0 standard drinks of alcohol    Comment: no etoh since he was in his 50s   Drug use: Never   Sexual activity: Yes  Other Topics Concern   Not on file  Social History Narrative   Lives with wife.  Three children.     Social Determinants of Health   Financial Resource Strain: Not on file  Food Insecurity: No Food Insecurity (04/15/2022)   Hunger Vital Sign    Worried About Running Out of Food in the Last Year: Never true    Ran Out of Food in the Last Year: Never true  Transportation Needs: Not on file  Physical Activity: Not on file  Stress: Not on file  Social Connections: Not on file     Family History: The patient's family history includes Breast cancer in his mother; Lung cancer in his brother; Stroke in his mother. There is no history of Colon cancer.  ROS:   Review of Systems  Constitutional:  Positive for malaise/fatigue. Negative for chills, diaphoresis, fever and weight loss.       See HPI.   HENT: Negative.    Eyes: Negative.   Respiratory:  Positive for shortness of breath. Negative for cough, hemoptysis, sputum production and wheezing.        See HPI.   Cardiovascular:  Positive for chest pain and leg swelling. Negative for palpitations, orthopnea, claudication and PND.       See HPI.   Gastrointestinal: Negative.   Genitourinary: Negative.   Musculoskeletal: Negative.   Skin: Negative.   Neurological:  Positive for weakness. Negative for dizziness, tingling, tremors, sensory  change, speech change, focal weakness, seizures, loss of consciousness and headaches.  Endo/Heme/Allergies: Negative.   Psychiatric/Behavioral: Negative.  Please see the history of present illness.    All other systems reviewed and are negative.  EKGs/Labs/Other Studies Reviewed:    The following studies were reviewed today:   EKG:  EKG is not ordered today.    Echocardiogram on July 15, 2022:  1. Left ventricular ejection fraction, by estimation, is 20 to 25%. The  left ventricle has severely decreased function. The left ventricle  demonstrates global hypokinesis. The left ventricular internal cavity size  was moderately dilated. There is mild  left ventricular hypertrophy. Left ventricular diastolic parameters are  indeterminate.   2. Right ventricular systolic function is normal. The right ventricular  size is normal. There is normal pulmonary artery systolic pressure.   3. Left atrial size was severely dilated.   4. Right atrial size was severely dilated.   5. The mitral valve is normal in structure. No evidence of mitral valve  regurgitation. No evidence of mitral stenosis.   6. The tricuspid valve is abnormal.   7. The aortic valve is tricuspid. Aortic valve regurgitation is not  visualized. No aortic stenosis is present.   8. The inferior vena cava is normal in size with greater than 50%  respiratory variability, suggesting right atrial pressure of 3 mmHg.  Right left heart cath on September 03, 2019: Mr. Reust has normal coronary arteries and low filling pressures suggesting that he has been adequately or over diuresed. He did have a high V waves suggesting that he potentially has significant mitral regurgitation. His systolic pressures were in the mid to high 90s and I therefore gave him a 250 cc bolus of saline given his low LVEDP and wedge. He will need guideline directed optimal medical therapy for his LV dysfunction. The sheath removed and a TR band was placed  on the right wrist to achieve patent hemostasis. The patient left lab in stable condition. His renal function be carefully monitored. Dr. Davina Poke was notified of these results.  Myoview on Dec 11, 2014: IMPRESSION: 1. Moderate size region of inferior wall myocardial scar. No ischemic territories seen. Overlying soft tissue attenuation artifact cannot entirely be ruled out.   2. Mild inferior wall hypokinesis.   3. Left ventricular ejection fraction 48%   4.  Low to intermediate-risk stress test findings*.  Recent Labs: 07/14/2022: B Natriuretic Peptide 456.0 07/15/2022: ALT 10; Magnesium 2.2 07/16/2022: BUN 7; Creatinine, Ser 1.09; Potassium 4.3; Sodium 140 07/17/2022: Hemoglobin 8.1; Platelets 263  Recent Lipid Panel No results found for: "CHOL", "TRIG", "HDL", "CHOLHDL", "VLDL", "LDLCALC", "LDLDIRECT"   Physical Exam:    VS:  BP 126/60   Pulse 80   Ht '5\' 6"'$  (1.676 m)   Wt 140 lb 9.6 oz (63.8 kg)   SpO2 91%   BMI 22.69 kg/m     Wt Readings from Last 3 Encounters:  08/19/22 140 lb 9.6 oz (63.8 kg)  07/16/22 138 lb 7.2 oz (62.8 kg)  07/14/22 134 lb 3.2 oz (60.9 kg)     GEN: Thin, 82 y.o. male, appears lethargic, ill-appearing HEENT: Normal NECK: No JVD; No carotid bruits CARDIAC: S1/S2, RRR, no murmurs, rubs, gallops; 2+ pulses RESPIRATORY:  Diminished scattered crackles to auscultation without wheezing or rhonchi  MUSCULOSKELETAL:  No leg edema, does have nonpitting edema along bilateral ankles and feet; No deformity  SKIN: Warm and dry NEUROLOGIC:  Alert and oriented x 3 PSYCHIATRIC:  Flat affect   ASSESSMENT:    1. Chronic systolic heart failure ([place])   2. Medication management   3. Chest  pain of uncertain etiology   4. SOB (shortness of breath)   5. Lung crackles   6. Lower leg edema   7. Hypertension, unspecified type    PLAN:    In order of problems listed above:  Chronic systolic CHF, medication management Echocardiogram in 07/2022 revealed EF 20-25%,  unchanged from 10/2021. Showing signs today of ADHF. (See PE). Recommended inpatient management, however patient declined. Discussed risks of outpatient management and he verbalized understanding. Will obtain labwork including: CMET and pro-BNP. Plan to up-titrate Torsemide if kidney function stable and BNP elevated. Continue Farxiga, Toprol XL, Torsemide, and Valsartan. Case d/w Dr. Carlyle Dolly who recommended with patient's current blood pressure to continue valsartan instead of Entresto. ED precautions discussed. Low sodium diet, fluid restriction <2L, and daily weights encouraged. Educated to contact our office for weight gain of 2 lbs overnight or 5 lbs in one week. Discussed and recommended for patient to be seen at HF clinic for first available appointment, has previously been established.   Chest pain Similar presentation at last OV. Chronic, stable for several months. Showing signs of volume overload, therefore would need to optimize volume before pursuing considering ischemic evaluation. No current chest pain now. Cath 2021 showed normal coronary arteries. Dr. Harl Bowie previously stated that symptoms in past improve with diuretic. If labs are WNL, plan to increase usage. Wondering if COPD is contributing to this. ED precautions discussed.   Shortness of breath/Crackles, lower leg swelling New since last OV. Diminished, scattered crackles noted on exam. Will obtain 2 view CXR to r/o anything acute, concerned this may be evidence of volume overload/PNA. Has had ongoing hx of swelling. Will obtain following labs: CBC with differential, CMET, and pro-BNP. If labs WNL, plan to increase Torsemide usage and will switch to Torsemide daily thereafter. ED precautions discussed. Continue to follow with PCP.  HTN BP today 126/60. SBP < 100 occasionally per daughter's report. No change in medications at this time. Discussed to monitor BP at home at least 2 hours after medications and sitting for 5-10 minutes.  Will obtain labs. Continue to monitor and log BP at home. Heart healthy diet and regular cardiovascular exercise as tolerated encouraged.   5. Disposition: Follow up with me in 1 week or sooner if anything changes. As stated earlier (point #1),  I recommended inpatient management based on patient's signs and symptoms and PE findings today, however patient declined. Discussed risks of outpatient management and he verbalized understanding.   I spent 45 minutes with the patient and discussed this case with patient's cardiologist, Dr. Harl Bowie who agreed with above plan.   Medication Adjustments/Labs and Tests Ordered: Current medicines are reviewed at length with the patient today.  Concerns regarding medicines are outlined above.  Orders Placed This Encounter  Procedures   DG Chest 2 View   Comprehensive metabolic panel   Pro b natriuretic peptide (BNP)[id number])   CBC w/Diff/Platelet   No orders of the defined types were placed in this encounter.   Patient Instructions  Medication Instructions:  Your physician recommends that you continue on your current medications as directed. Please refer to the Current Medication list given to you today.  Labwork: CBC w/diff, CMET, ProBNP today at [facility]  Testing/Procedures: A chest x-ray takes a picture of the organs and structures inside the chest, including the heart, lungs, and blood vessels. This test can show several things, including, whether the heart is enlarges; whether fluid is building up in the lungs; and  whether pacemaker / defibrillator leads are still in place.  Follow-Up: Your physician recommends that you schedule a follow-up appointment in: 1 week  Any Other Special Instructions Will Be Listed Below (If Applicable). Reschedule appointment in the [place] given for CHF diet and [place]  If you need a refill on your cardiac medications before your next appointment, please  call your pharmacy.   SignedFinis Bud, NP  08/19/2022 9:47 PM    [place]

## 2022-08-19 NOTE — Patient Instructions (Addendum)
Medication Instructions:  Your physician recommends that you continue on your current medications as directed. Please refer to the Current Medication list given to you today.  Labwork: CBC w/diff, CMET, ProBNP today at [facility]  Testing/Procedures: A chest x-ray takes a picture of the organs and structures inside the chest, including the heart, lungs, and blood vessels. This test can show several things, including, whether the heart is enlarges; whether fluid is building up in the lungs; and whether pacemaker / defibrillator leads are still in place.  Follow-Up: Your physician recommends that you schedule a follow-up appointment in: 1 week  Any Other Special Instructions Will Be Listed Below (If Applicable). Reschedule appointment in the [place] given for CHF diet and [place] Six  If you need a refill on your cardiac medications before your next appointment, please call your pharmacy.

## 2022-08-21 ENCOUNTER — Telehealth: Payer: Self-pay | Admitting: Home Health

## 2022-08-21 NOTE — Telephone Encounter (Signed)
[facility] paged after hour line reporting patient had abnormal blood work including CBC with hemoglobin 7.8 today.  Patient was seen by NP Finis Bud 08/19/2022 and was advised to obtain blood work before medication titration.  Upon additional review of his blood work from today, his creatinine has elevated to 1.93 with BUN 21 and eGFR 34.  Last available BMP was done 07/16/2022 with creatinine 1.09.  There is concern that patient is suffering AKI, advised the patient to go to the nearest ER for evaluation of renal failure.  Anticipate home meds Farxiga, valsartan might need to be held, will forward this message to [facility].

## 2022-08-22 ENCOUNTER — Emergency Department (HOSPITAL_COMMUNITY): Payer: Medicare HMO

## 2022-08-22 ENCOUNTER — Inpatient Hospital Stay (HOSPITAL_COMMUNITY)
Admission: EM | Admit: 2022-08-22 | Discharge: 2022-08-24 | DRG: 178 | Disposition: A | Discharge status: Home Health | Payer: Medicare HMO | Attending: Family Medicine | Admitting: Family Medicine

## 2022-08-22 ENCOUNTER — Encounter (HOSPITAL_COMMUNITY): Payer: Self-pay

## 2022-08-22 ENCOUNTER — Other Ambulatory Visit: Payer: Self-pay

## 2022-08-22 DIAGNOSIS — E441 Mild protein-calorie malnutrition: Secondary | ICD-10-CM | POA: Diagnosis present

## 2022-08-22 DIAGNOSIS — I10 Essential (primary) hypertension: Secondary | ICD-10-CM | POA: Diagnosis present

## 2022-08-22 DIAGNOSIS — Z87891 Personal history of nicotine dependence: Secondary | ICD-10-CM

## 2022-08-22 DIAGNOSIS — Z905 Acquired absence of kidney: Secondary | ICD-10-CM

## 2022-08-22 DIAGNOSIS — E1122 Type 2 diabetes mellitus with diabetic chronic kidney disease: Secondary | ICD-10-CM | POA: Diagnosis present

## 2022-08-22 DIAGNOSIS — Z7951 Long term (current) use of inhaled steroids: Secondary | ICD-10-CM

## 2022-08-22 DIAGNOSIS — J470 Bronchiectasis with acute lower respiratory infection: Secondary | ICD-10-CM | POA: Diagnosis present

## 2022-08-22 DIAGNOSIS — N1831 Chronic kidney disease, stage 3a: Secondary | ICD-10-CM | POA: Diagnosis present

## 2022-08-22 DIAGNOSIS — I5022 Chronic systolic (congestive) heart failure: Secondary | ICD-10-CM | POA: Diagnosis present

## 2022-08-22 DIAGNOSIS — Z6822 Body mass index (BMI) 22.0-22.9, adult: Secondary | ICD-10-CM

## 2022-08-22 DIAGNOSIS — R7989 Other specified abnormal findings of blood chemistry: Secondary | ICD-10-CM

## 2022-08-22 DIAGNOSIS — Z86711 Personal history of pulmonary embolism: Secondary | ICD-10-CM

## 2022-08-22 DIAGNOSIS — Z823 Family history of stroke: Secondary | ICD-10-CM

## 2022-08-22 DIAGNOSIS — J189 Pneumonia, unspecified organism: Principal | ICD-10-CM

## 2022-08-22 DIAGNOSIS — I130 Hypertensive heart and chronic kidney disease with heart failure and stage 1 through stage 4 chronic kidney disease, or unspecified chronic kidney disease: Secondary | ICD-10-CM | POA: Diagnosis present

## 2022-08-22 DIAGNOSIS — J690 Pneumonitis due to inhalation of food and vomit: Principal | ICD-10-CM | POA: Diagnosis present

## 2022-08-22 DIAGNOSIS — Z79899 Other long term (current) drug therapy: Secondary | ICD-10-CM

## 2022-08-22 DIAGNOSIS — J441 Chronic obstructive pulmonary disease with (acute) exacerbation: Secondary | ICD-10-CM | POA: Diagnosis present

## 2022-08-22 DIAGNOSIS — Z1152 Encounter for screening for COVID-19: Secondary | ICD-10-CM

## 2022-08-22 DIAGNOSIS — I2489 Other forms of acute ischemic heart disease: Secondary | ICD-10-CM | POA: Diagnosis present

## 2022-08-22 DIAGNOSIS — E8809 Other disorders of plasma-protein metabolism, not elsewhere classified: Secondary | ICD-10-CM | POA: Diagnosis present

## 2022-08-22 DIAGNOSIS — J9611 Chronic respiratory failure with hypoxia: Secondary | ICD-10-CM | POA: Diagnosis present

## 2022-08-22 DIAGNOSIS — J449 Chronic obstructive pulmonary disease, unspecified: Secondary | ICD-10-CM | POA: Diagnosis present

## 2022-08-22 DIAGNOSIS — K219 Gastro-esophageal reflux disease without esophagitis: Secondary | ICD-10-CM | POA: Diagnosis present

## 2022-08-22 DIAGNOSIS — N179 Acute kidney failure, unspecified: Secondary | ICD-10-CM | POA: Diagnosis present

## 2022-08-22 DIAGNOSIS — N1832 Chronic kidney disease, stage 3b: Secondary | ICD-10-CM | POA: Diagnosis present

## 2022-08-22 LAB — COMPREHENSIVE METABOLIC PANEL
ALT: 9 U/L (ref 0–44)
AST: 20 U/L (ref 15–41)
Albumin: 3.2 g/dL — ABNORMAL LOW (ref 3.5–5.0)
Alkaline Phosphatase: 53 U/L (ref 38–126)
Anion gap: 7 (ref 5–15)
BUN: 20 mg/dL (ref 8–23)
CO2: 31 mmol/L (ref 22–32)
Calcium: 8.4 mg/dL — ABNORMAL LOW (ref 8.9–10.3)
Chloride: 102 mmol/L (ref 98–111)
Creatinine, Ser: 1.59 mg/dL — ABNORMAL HIGH (ref 0.61–1.24)
GFR, Estimated: 43 mL/min — ABNORMAL LOW (ref >60)
Glucose, Bld: 94 mg/dL (ref 70–99)
Potassium: 4.4 mmol/L (ref 3.5–5.1)
Sodium: 140 mmol/L (ref 135–145)
Total Bilirubin: 0.4 mg/dL (ref 0.3–1.2)
Total Protein: 7 g/dL (ref 6.5–8.1)

## 2022-08-22 LAB — CBC WITH DIFFERENTIAL/PLATELET
Abs Immature Granulocytes: 0 10*3/uL (ref 0.00–0.07)
Basophils Absolute: 0 10*3/uL (ref 0.0–0.1)
Basophils Relative: 1 %
Eosinophils Absolute: 0.2 10*3/uL (ref 0.0–0.5)
Eosinophils Relative: 4 %
HCT: 30.9 % — ABNORMAL LOW (ref 39.0–52.0)
Hemoglobin: 8.2 g/dL — ABNORMAL LOW (ref 13.0–17.0)
Immature Granulocytes: 0 %
Lymphocytes Relative: 35 %
Lymphs Abs: 1.7 10*3/uL (ref 0.7–4.0)
MCH: 26.2 pg (ref 26.0–34.0)
MCHC: 26.5 g/dL — ABNORMAL LOW (ref 30.0–36.0)
MCV: 98.7 fL (ref 80.0–100.0)
Monocytes Absolute: 0.5 10*3/uL (ref 0.1–1.0)
Monocytes Relative: 9 %
Neutro Abs: 2.5 10*3/uL (ref 1.7–7.7)
Neutrophils Relative %: 51 %
Platelets: 231 10*3/uL (ref 150–400)
RBC: 3.13 MIL/uL — ABNORMAL LOW (ref 4.22–5.81)
RDW: 15.3 % (ref 11.5–15.5)
WBC: 4.9 10*3/uL (ref 4.0–10.5)
nRBC: 0 % (ref 0.0–0.2)

## 2022-08-22 LAB — TROPONIN I (HIGH SENSITIVITY)
Troponin I (High Sensitivity): 19 ng/L — ABNORMAL HIGH (ref <18)
Troponin I (High Sensitivity): 19 ng/L — ABNORMAL HIGH (ref <18)

## 2022-08-22 LAB — BRAIN NATRIURETIC PEPTIDE: B Natriuretic Peptide: 564 pg/mL — ABNORMAL HIGH (ref 0.0–100.0)

## 2022-08-22 MED ORDER — SODIUM CHLORIDE 0.9 % IV BOLUS
500.0000 mL | Freq: Once | INTRAVENOUS | Status: AC
  Administered 2022-08-22: 500 mL via INTRAVENOUS

## 2022-08-22 MED ORDER — SODIUM CHLORIDE 0.9 % IV SOLN
3.0000 g | Freq: Once | INTRAVENOUS | Status: AC
  Administered 2022-08-22: 3 g via INTRAVENOUS
  Filled 2022-08-22: qty 8

## 2022-08-22 MED ORDER — SODIUM CHLORIDE 0.9 % IV SOLN
500.0000 mg | INTRAVENOUS | Status: DC
  Administered 2022-08-22 – 2022-08-23 (×2): 500 mg via INTRAVENOUS
  Filled 2022-08-22 (×2): qty 5

## 2022-08-22 MED ORDER — MORPHINE SULFATE (PF) 4 MG/ML IV SOLN
4.0000 mg | Freq: Once | INTRAVENOUS | Status: AC
  Administered 2022-08-22: 4 mg via INTRAVENOUS
  Filled 2022-08-22: qty 1

## 2022-08-22 NOTE — ED Provider Triage Note (Signed)
Emergency Medicine Provider Triage Evaluation Note  Gregory Mitchell , a 82 y.o. male  was evaluated in triage.  Pt complains of abnormal labs.  Patient has known history of heart failure and solitary kidney.  Was seen by his cardiologist on Friday.  Was found to have "crackles on lung exam".  Labs ordered at that time.  Concerned that kidney function has declined based on the findings of the lab in comparison to last.  Patient states that last 3 to 4 days his urinary output has been reduced.  But denies all other urinary symptoms.  Denies shortness of breath.  States he has had chest pain but it has been unchanged and been going on for a year.  Denies cough congestion fever.  Review of Systems  Positive: See above Negative: See above  Physical Exam  BP (!) 119/54 (BP Location: Right Arm)   Pulse 77   Temp 99.6 F (37.6 C) (Oral)   Resp 16   Ht '5\' 6"'$  (1.676 m)   Wt 64.4 kg   SpO2 96%   BMI 22.92 kg/m  Gen:   Awake, no distress   Resp:  Normal effort, diffuse rhonchi, no rales or wheezing MSK:   Moves extremities without difficulty  Other:    Medical Decision Making  Medically screening exam initiated at 5:51 PM.  Appropriate orders placed.  DEDDRICK SAINDON was informed that the remainder of the evaluation will be completed by another provider, this initial triage assessment does not replace that evaluation, and the importance of remaining in the ED until their evaluation is complete.  Work up started   Harriet Pho, PA-C 08/22/22 1754

## 2022-08-22 NOTE — ED Triage Notes (Signed)
Pt presents to ED for abnormal labs. Daughter states kidney function was abnormal. Creatinine was 1.93 BUN 21. HGB also abnormal 7.8, pt denies blood in stool

## 2022-08-22 NOTE — ED Provider Notes (Signed)
[facility] EMERGENCY DEPARTMENT Provider Note   CSN: [id number] Arrival date & time: 08/22/22  1645     History  Chief Complaint  Patient presents with   Abnormal Labs    Gregory Mitchell is a 82 y.o. male.  With a history of renal insufficiency, asthma, COPD, CHF with an ejection fraction of 20 to 25% on 07/15/2022, hypertension, diabetes, nephrectomy who presents to the ED for evaluation of increased cough and abnormal labs.  He was seen by his cardiologist 2 days ago and was found to have crackles in bilateral lower lobes.  Chest x-ray and labs were ordered at that time.  Patient received a call from his cardiology office today and was told that his kidney function was elevated.  He reports they were encouraged to follow-up in the ED for this.  He did report that he was urinating slightly less over the past 3 to 4 days.  He states he has had intermittent chest pain, however has had the symptoms for at least the past year.  He has shortness of breath at baseline and reports that is not any worse than it typically is right now.  He typically has a cough at baseline productive of yellow sputum.  He states that this has become slightly more frequent recently.  He denies abdominal pain, nausea, vomiting, diarrhea, dizziness, lightheadedness, fevers, chills.  States he has been taking his medications as prescribed.  HPI     Home Medications Prior to Admission medications   Medication Sig Start Date End Date Taking? Authorizing Provider  albuterol (PROVENTIL) (2.5 MG/3ML) 0.083% nebulizer solution Take 3 mLs (2.5 mg total) by nebulization every 4 (four) hours as needed for wheezing or shortness of breath. 04/16/22 04/16/23  Roxan Hockey, MD  albuterol (VENTOLIN HFA) 108 (90 Base) MCG/ACT inhaler Inhale into the lungs. 07/13/22   [provider]  atorvastatin (LIPITOR) 40 MG tablet Take 1 tablet by mouth daily.    [provider]  budesonide (PULMICORT) 0.25 MG/2ML nebulizer  solution Take 2 mLs (0.25 mg total) by nebulization 2 (two) times daily. 01/28/22   Tanda Rockers, MD  dapagliflozin propanediol (FARXIGA) 10 MG TABS tablet Take 1 tablet (10 mg total) by mouth daily before breakfast. 02/25/22   Arnoldo Lenis, MD  famotidine (PEPCID) 40 MG tablet Take 1 tablet by mouth daily.    [provider]  guaiFENesin (MUCINEX) 600 MG 12 hr tablet Take 1 tablet (600 mg total) by mouth 2 (two) times daily. 04/16/22 04/16/23  Roxan Hockey, MD  HM SENNA-S 8.6-50 MG tablet Take 2 tablets by mouth daily. 07/10/21   [provider]  HYDROcodone-acetaminophen (NORCO) 7.5-325 MG tablet Take 1 tablet by mouth 2 (two) times daily as needed for moderate pain. 11/27/20   [provider]  ipratropium-albuterol (DUONEB) 0.5-2.5 (3) MG/3ML SOLN Take 3 mLs by nebulization 4 (four) times daily. 06/30/22   [provider]  metoprolol succinate (TOPROL XL) 25 MG 24 hr tablet Take 1 tablet (25 mg total) by mouth daily. 12/18/21   Strader, Fransisco Hertz, PA-C  mirtazapine (REMERON) 15 MG tablet Take 15 mg by mouth daily. 07/07/22   [provider]  OXYGEN Inhale 3 L into the lungs daily as needed (for shortness of breath).    [provider]  pantoprazole (PROTONIX) 40 MG tablet Take 1 tablet (40 mg total) by mouth daily. 04/16/22   Roxan Hockey, MD  tamsulosin (FLOMAX) 0.4 MG CAPS capsule Take 0.8 mg by mouth  2 (two) times daily.    [provider]  torsemide (DEMADEX) 20 MG tablet Take 1 tablet (20 mg total) by mouth as needed (swelling). Patient taking differently: Take 20 mg by mouth daily as needed (swelling). 03/23/22   Arnoldo Lenis, MD  valsartan (DIOVAN) 80 MG tablet Take 80 mg by mouth daily. 07/13/22   [provider]      Allergies    Patient has no known allergies.    Review of Systems   Review of Systems  Respiratory:  Positive for cough and shortness of breath.   Cardiovascular:  Positive for chest  pain and leg swelling.  All other systems reviewed and are negative.   Physical Exam Updated Vital Signs BP 115/64 (BP Location: Right Arm)   Pulse 79   Temp 98.9 F (37.2 C) (Oral)   Resp 19   Ht '5\' 6"'$  (1.676 m)   Wt 64.4 kg   SpO2 97%   BMI 22.92 kg/m  Physical Exam Vitals and nursing note reviewed.  Constitutional:      General: He is not in acute distress.    Appearance: He is well-developed. He is ill-appearing (Chronic).  HENT:     Head: Normocephalic and atraumatic.  Eyes:     Conjunctiva/sclera: Conjunctivae normal.  Cardiovascular:     Rate and Rhythm: Normal rate and regular rhythm.     Heart sounds: No murmur heard. Pulmonary:     Effort: Pulmonary effort is normal. No respiratory distress.     Breath sounds: Rales (Bilateral lower lobes) present.  Abdominal:     Palpations: Abdomen is soft.     Tenderness: There is no abdominal tenderness. There is no guarding or rebound.  Musculoskeletal:        General: No swelling.     Cervical back: Neck supple.     Right lower leg: Edema present.     Left lower leg: Edema present.     Comments: Bilateral 2+ pitting edema  Skin:    General: Skin is warm and dry.     Capillary Refill: Capillary refill takes less than 2 seconds.  Neurological:     General: No focal deficit present.     Mental Status: He is alert and oriented to person, place, and time.  Psychiatric:        Mood and Affect: Mood normal.     ED Results / Procedures / Treatments   Labs (all labs ordered are listed, but only abnormal results are displayed) Labs Reviewed  COMPREHENSIVE METABOLIC PANEL - Abnormal; Notable for the following components:      Result Value   Creatinine, Ser 1.59 (*)    Calcium 8.4 (*)    Albumin 3.2 (*)    GFR, Estimated 43 (*)    All other components within normal limits  CBC WITH DIFFERENTIAL/PLATELET - Abnormal; Notable for the following components:   RBC 3.13 (*)    Hemoglobin 8.2 (*)    HCT 30.9 (*)    MCHC  26.5 (*)    All other components within normal limits  BRAIN NATRIURETIC PEPTIDE - Abnormal; Notable for the following components:   B Natriuretic Peptide 564.0 (*)    All other components within normal limits  TROPONIN I (HIGH SENSITIVITY) - Abnormal; Notable for the following components:   Troponin I (High Sensitivity) 19 (*)    All other components within normal limits  TROPONIN I (HIGH SENSITIVITY) - Abnormal; Notable for the following components:   Troponin I (  High Sensitivity) 19 (*)    All other components within normal limits    EKG EKG Interpretation  Date/Time:  Sunday August 22 2022 19:16:35 EST Ventricular Rate:  69 PR Interval:  214 QRS Duration: 146 QT Interval:  445 QTC Calculation: 477 R Axis:   -77 Text Interpretation: Sinus or ectopic atrial rhythm Borderline prolonged PR interval RBBB and LAFB Abnrm T, consider ischemia, anterolateral lds since last tracing no significant change Confirmed by Noemi Chapel [phone]) on 08/22/2022 10:38:22 PM  Radiology DG Chest 2 View  Result Date: 08/22/2022 CLINICAL DATA:  Chest pain and dyspnea EXAM: CHEST - 2 VIEW COMPARISON:  Chest radiograph from one day prior. FINDINGS: Stable cardiomediastinal silhouette with normal heart size. No pneumothorax. No pleural effusion. No pulmonary edema. Patchy consolidation, reticulation and bronchiectasis at the left greater than right lung bases, chronic, with slightly worsened left lung base consolidation. IMPRESSION: Chronic patchy consolidation, reticulation and bronchiectasis at the left greater than right lung bases, with slightly worsened left lung base consolidation, suggesting worsening of chronic/recurrent infection such as due to recurrent aspiration. Electronically Signed   By: Ilona Sorrel M.D.   On: 08/22/2022 18:31    Procedures Procedures    Medications Ordered in ED Medications  Ampicillin-Sulbactam (UNASYN) 3 g in sodium chloride 0.9 % 100 mL IVPB (3 g Intravenous New  Bag/Given 08/22/22 2211)  azithromycin (ZITHROMAX) 500 mg in sodium chloride 0.9 % 250 mL IVPB (has no administration in time range)  sodium chloride 0.9 % bolus 500 mL (500 mLs Intravenous New Bag/Given 08/22/22 2208)    ED Course/ Medical Decision Making/ A&P                             Medical Decision Making This patient presents to the ED for concern of cough, abnormal labs, this involves an extensive number of treatment options, and is a complaint that carries with it a high risk of complications and morbidity.  The emergent differential diagnosis for shortness of breath includes, but is not limited to, Pulmonary edema, bronchoconstriction, Pneumonia, Pulmonary embolism, Pneumotherax/ Hemothorax, Dysrythmia, ACS.    Co morbidities that complicate the patient evaluation   renal insufficiency, asthma, COPD, CHF, hypertension, diabetes, nephrectomy  My initial workup includes CMP, CBC, BNP, troponin, delta troponin, chest x-ray  Additional history obtained from: Nursing notes from this visit. Previous records within EMR system office visit on 08/19/2022 for CHF Family daughter provides personal history  I ordered, reviewed and interpreted labs which include: CMP, CBC, troponin, delta troponin, BNP.  BNP elevated to 564.  Troponin chronically elevated at 19.  Stable anemia of 8.2.  Creatinine elevated to 1.59 with a baseline of approximately 1.15  I ordered imaging studies including chest x-ray I independently visualized and interpreted imaging which showed slightly worsened left lung base consolidation concerning for aspiration pneumonia I agree with the radiologist interpretation  Cardiac Monitoring:  The patient was maintained on a cardiac monitor.  I personally viewed and interpreted the cardiac monitored which showed an underlying rhythm of: NSR  Consultations Obtained:  I requested consultation with the hospitalist Dr. Bryn Gulling,  and discussed lab and imaging findings as well  as pertinent plan - they recommend: Admission, initiate azithromycin  Afebrile, hemodynamically stable.  82 year old male presents ED for evaluation of increased cough and abnormal labs found.  On exam, patient has bilateral lower lobe Rales and is overall in poor health.  He is hemoglobin is stable at  8.2 with baseline of 8.  Creatinine elevated to 1.59 with baseline of 1.1.  BNP elevated to 564.  Chest x-ray shows bilateral lower lobe infiltrates which appear chronic with worsening in the left lower lobe suggestive of aspiration pneumonia.  Due to his singular kidney status patient does require fluid resuscitation for his acute kidney injury, however in sparing amounts as he also has CHF with an EF of 20 to 25%.  Patient also has possible aspiration pneumonia in the left lower lobe.  Started on antibiotics for this.  He will be admitted to hospitalist for acute kidney injury and pneumonia.  Stable at the time of admission.  Patient's case discussed with Dr. Sabra Heck who agrees with plan to admit.   Note: Portions of this report may have been transcribed using voice recognition software. Every effort was made to ensure accuracy; however, inadvertent computerized transcription errors may still be present.         Final Clinical Impression(s) / ED Diagnoses Final diagnoses:  Pneumonia of both lower lobes due to infectious organism  Elevated serum creatinine    Rx / DC Orders ED Discharge Orders     None         Nehemiah Massed 08/22/22 2240    Noemi Chapel, MD 08/22/22 2355

## 2022-08-23 DIAGNOSIS — Z87891 Personal history of nicotine dependence: Secondary | ICD-10-CM | POA: Diagnosis not present

## 2022-08-23 DIAGNOSIS — Z86711 Personal history of pulmonary embolism: Secondary | ICD-10-CM | POA: Diagnosis not present

## 2022-08-23 DIAGNOSIS — R7989 Other specified abnormal findings of blood chemistry: Secondary | ICD-10-CM

## 2022-08-23 DIAGNOSIS — E441 Mild protein-calorie malnutrition: Secondary | ICD-10-CM | POA: Diagnosis present

## 2022-08-23 DIAGNOSIS — K219 Gastro-esophageal reflux disease without esophagitis: Secondary | ICD-10-CM

## 2022-08-23 DIAGNOSIS — I5022 Chronic systolic (congestive) heart failure: Secondary | ICD-10-CM | POA: Diagnosis present

## 2022-08-23 DIAGNOSIS — J449 Chronic obstructive pulmonary disease, unspecified: Secondary | ICD-10-CM | POA: Diagnosis not present

## 2022-08-23 DIAGNOSIS — J690 Pneumonitis due to inhalation of food and vomit: Secondary | ICD-10-CM | POA: Diagnosis present

## 2022-08-23 DIAGNOSIS — I2489 Other forms of acute ischemic heart disease: Secondary | ICD-10-CM | POA: Diagnosis present

## 2022-08-23 DIAGNOSIS — E8809 Other disorders of plasma-protein metabolism, not elsewhere classified: Secondary | ICD-10-CM

## 2022-08-23 DIAGNOSIS — Z7951 Long term (current) use of inhaled steroids: Secondary | ICD-10-CM | POA: Diagnosis not present

## 2022-08-23 DIAGNOSIS — E46 Unspecified protein-calorie malnutrition: Secondary | ICD-10-CM

## 2022-08-23 DIAGNOSIS — Z6822 Body mass index (BMI) 22.0-22.9, adult: Secondary | ICD-10-CM | POA: Diagnosis not present

## 2022-08-23 DIAGNOSIS — Z905 Acquired absence of kidney: Secondary | ICD-10-CM | POA: Diagnosis not present

## 2022-08-23 DIAGNOSIS — N179 Acute kidney failure, unspecified: Secondary | ICD-10-CM

## 2022-08-23 DIAGNOSIS — J9611 Chronic respiratory failure with hypoxia: Secondary | ICD-10-CM | POA: Diagnosis present

## 2022-08-23 DIAGNOSIS — E1122 Type 2 diabetes mellitus with diabetic chronic kidney disease: Secondary | ICD-10-CM

## 2022-08-23 DIAGNOSIS — I130 Hypertensive heart and chronic kidney disease with heart failure and stage 1 through stage 4 chronic kidney disease, or unspecified chronic kidney disease: Secondary | ICD-10-CM | POA: Diagnosis present

## 2022-08-23 DIAGNOSIS — N1832 Chronic kidney disease, stage 3b: Secondary | ICD-10-CM | POA: Diagnosis present

## 2022-08-23 DIAGNOSIS — Z79899 Other long term (current) drug therapy: Secondary | ICD-10-CM | POA: Diagnosis not present

## 2022-08-23 DIAGNOSIS — Z1152 Encounter for screening for COVID-19: Secondary | ICD-10-CM | POA: Diagnosis not present

## 2022-08-23 DIAGNOSIS — Z823 Family history of stroke: Secondary | ICD-10-CM | POA: Diagnosis not present

## 2022-08-23 DIAGNOSIS — J441 Chronic obstructive pulmonary disease with (acute) exacerbation: Secondary | ICD-10-CM | POA: Diagnosis present

## 2022-08-23 DIAGNOSIS — J470 Bronchiectasis with acute lower respiratory infection: Secondary | ICD-10-CM | POA: Diagnosis present

## 2022-08-23 DIAGNOSIS — N189 Chronic kidney disease, unspecified: Secondary | ICD-10-CM

## 2022-08-23 LAB — GLUCOSE, CAPILLARY: Glucose-Capillary: 110 mg/dL — ABNORMAL HIGH (ref 70–99)

## 2022-08-23 LAB — RESPIRATORY PANEL BY PCR

## 2022-08-23 LAB — CBC
HCT: 27.5 % — ABNORMAL LOW (ref 39.0–52.0)
Hemoglobin: 7.5 g/dL — ABNORMAL LOW (ref 13.0–17.0)
MCH: 26.4 pg (ref 26.0–34.0)
MCHC: 27.3 g/dL — ABNORMAL LOW (ref 30.0–36.0)
MCV: 96.8 fL (ref 80.0–100.0)
Platelets: 205 10*3/uL (ref 150–400)
RBC: 2.84 MIL/uL — ABNORMAL LOW (ref 4.22–5.81)
RDW: 15.1 % (ref 11.5–15.5)
WBC: 4.6 10*3/uL (ref 4.0–10.5)
nRBC: 0 % (ref 0.0–0.2)

## 2022-08-23 LAB — COMPREHENSIVE METABOLIC PANEL
ALT: 8 U/L (ref 0–44)
AST: 14 U/L — ABNORMAL LOW (ref 15–41)
Albumin: 2.6 g/dL — ABNORMAL LOW (ref 3.5–5.0)
Alkaline Phosphatase: 49 U/L (ref 38–126)
Anion gap: 9 (ref 5–15)
BUN: 17 mg/dL (ref 8–23)
CO2: 28 mmol/L (ref 22–32)
Calcium: 7.9 mg/dL — ABNORMAL LOW (ref 8.9–10.3)
Chloride: 104 mmol/L (ref 98–111)
Creatinine, Ser: 1.38 mg/dL — ABNORMAL HIGH (ref 0.61–1.24)
GFR, Estimated: 51 mL/min — ABNORMAL LOW (ref >60)
Glucose, Bld: 78 mg/dL (ref 70–99)
Potassium: 4.1 mmol/L (ref 3.5–5.1)
Sodium: 141 mmol/L (ref 135–145)
Total Bilirubin: 0.4 mg/dL (ref 0.3–1.2)
Total Protein: 5.7 g/dL — ABNORMAL LOW (ref 6.5–8.1)

## 2022-08-23 LAB — CBG MONITORING, ED
Glucose-Capillary: 86 mg/dL (ref 70–99)
Glucose-Capillary: 88 mg/dL (ref 70–99)
Glucose-Capillary: 112 mg/dL — ABNORMAL HIGH (ref 70–99)

## 2022-08-23 LAB — STREP PNEUMONIAE URINARY ANTIGEN: Strep Pneumo Urinary Antigen: NEGATIVE

## 2022-08-23 LAB — RESP PANEL BY RT-PCR (RSV, FLU A&B, COVID)  RVPGX2
Influenza A by PCR: NEGATIVE
Influenza B by PCR: NEGATIVE
Resp Syncytial Virus by PCR: NEGATIVE
SARS Coronavirus 2 by RT PCR: NEGATIVE

## 2022-08-23 LAB — PROCALCITONIN: Procalcitonin: <0.1 ng/mL

## 2022-08-23 MED ORDER — PANTOPRAZOLE SODIUM 40 MG PO TBEC
40.0000 mg | DELAYED_RELEASE_TABLET | Freq: Every day | ORAL | Status: DC
  Administered 2022-08-23: 40 mg via ORAL
  Filled 2022-08-23: qty 1

## 2022-08-23 MED ORDER — MIRTAZAPINE 15 MG PO TABS
15.0000 mg | ORAL_TABLET | Freq: Every day | ORAL | Status: DC
  Administered 2022-08-23 – 2022-08-24 (×2): 15 mg via ORAL
  Filled 2022-08-23 (×2): qty 1

## 2022-08-23 MED ORDER — IPRATROPIUM-ALBUTEROL 0.5-2.5 (3) MG/3ML IN SOLN
3.0000 mL | Freq: Three times a day (TID) | RESPIRATORY_TRACT | Status: DC
  Administered 2022-08-23 – 2022-08-24 (×2): 3 mL via RESPIRATORY_TRACT
  Filled 2022-08-23 (×2): qty 3

## 2022-08-23 MED ORDER — METOPROLOL SUCCINATE ER 25 MG PO TB24
25.0000 mg | ORAL_TABLET | Freq: Every day | ORAL | Status: DC
  Administered 2022-08-23 – 2022-08-24 (×2): 25 mg via ORAL
  Filled 2022-08-23 (×2): qty 1

## 2022-08-23 MED ORDER — BUDESONIDE 0.25 MG/2ML IN SUSP
0.2500 mg | Freq: Two times a day (BID) | RESPIRATORY_TRACT | Status: DC
  Administered 2022-08-23 – 2022-08-24 (×2): 0.25 mg via RESPIRATORY_TRACT
  Filled 2022-08-23 (×2): qty 2

## 2022-08-23 MED ORDER — SODIUM CHLORIDE 0.9 % IV SOLN
3.0000 g | Freq: Three times a day (TID) | INTRAVENOUS | Status: DC
  Administered 2022-08-23 – 2022-08-24 (×4): 3 g via INTRAVENOUS
  Filled 2022-08-23 (×4): qty 8

## 2022-08-23 MED ORDER — ALBUTEROL SULFATE (2.5 MG/3ML) 0.083% IN NEBU: 2.5000 mg | INHALATION_SOLUTION | RESPIRATORY_TRACT | Status: DC | PRN

## 2022-08-23 MED ORDER — ATORVASTATIN CALCIUM 40 MG PO TABS
40.0000 mg | ORAL_TABLET | Freq: Every day | ORAL | Status: DC
  Administered 2022-08-23 – 2022-08-24 (×2): 40 mg via ORAL
  Filled 2022-08-23 (×2): qty 1

## 2022-08-23 MED ORDER — ENSURE ENLIVE PO LIQD
237.0000 mL | Freq: Two times a day (BID) | ORAL | Status: DC
  Filled 2022-08-23 (×6): qty 237

## 2022-08-23 MED ORDER — MELATONIN 3 MG PO TABS
6.0000 mg | ORAL_TABLET | Freq: Once | ORAL | Status: AC
  Administered 2022-08-23: 6 mg via ORAL
  Filled 2022-08-23: qty 2

## 2022-08-23 MED ORDER — ONDANSETRON HCL 4 MG PO TABS: 4.0000 mg | ORAL_TABLET | Freq: Four times a day (QID) | ORAL | Status: DC | PRN

## 2022-08-23 MED ORDER — TORSEMIDE 20 MG PO TABS
20.0000 mg | ORAL_TABLET | Freq: Every day | ORAL | Status: DC
  Administered 2022-08-23: 20 mg via ORAL
  Filled 2022-08-23: qty 1

## 2022-08-23 MED ORDER — IPRATROPIUM-ALBUTEROL 0.5-2.5 (3) MG/3ML IN SOLN
3.0000 mL | Freq: Four times a day (QID) | RESPIRATORY_TRACT | Status: DC
  Administered 2022-08-23 (×2): 3 mL via RESPIRATORY_TRACT
  Filled 2022-08-23 (×2): qty 3

## 2022-08-23 MED ORDER — FAMOTIDINE 20 MG PO TABS
40.0000 mg | ORAL_TABLET | Freq: Every day | ORAL | Status: DC
  Administered 2022-08-23 – 2022-08-24 (×2): 40 mg via ORAL
  Filled 2022-08-23 (×2): qty 2

## 2022-08-23 MED ORDER — IRBESARTAN 75 MG PO TABS: 75.0000 mg | ORAL_TABLET | Freq: Every day | ORAL | Status: DC

## 2022-08-23 MED ORDER — ENOXAPARIN SODIUM 40 MG/0.4ML IJ SOSY
40.0000 mg | PREFILLED_SYRINGE | INTRAMUSCULAR | Status: DC
  Administered 2022-08-23 – 2022-08-24 (×2): 40 mg via SUBCUTANEOUS
  Filled 2022-08-23 (×2): qty 0.4

## 2022-08-23 MED ORDER — GUAIFENESIN ER 600 MG PO TB12
600.0000 mg | ORAL_TABLET | Freq: Two times a day (BID) | ORAL | Status: DC
  Administered 2022-08-23 – 2022-08-24 (×3): 600 mg via ORAL
  Filled 2022-08-23 (×3): qty 1

## 2022-08-23 MED ORDER — TAMSULOSIN HCL 0.4 MG PO CAPS
0.8000 mg | ORAL_CAPSULE | Freq: Two times a day (BID) | ORAL | Status: DC
  Administered 2022-08-23 (×2): 0.8 mg via ORAL
  Filled 2022-08-23 (×2): qty 2

## 2022-08-23 MED ORDER — ONDANSETRON HCL 4 MG/2ML IJ SOLN: 4.0000 mg | Freq: Four times a day (QID) | INTRAMUSCULAR | Status: DC | PRN

## 2022-08-23 MED ORDER — INSULIN ASPART 100 UNIT/ML IJ SOLN: 0.0000 [IU] | Freq: Three times a day (TID) | INTRAMUSCULAR | Status: DC

## 2022-08-23 MED ORDER — ACETAMINOPHEN 325 MG PO TABS
650.0000 mg | ORAL_TABLET | Freq: Four times a day (QID) | ORAL | Status: DC | PRN
  Administered 2022-08-23: 650 mg via ORAL
  Filled 2022-08-23: qty 2

## 2022-08-23 MED ORDER — ACETAMINOPHEN 650 MG RE SUPP: 650.0000 mg | Freq: Four times a day (QID) | RECTAL | Status: DC | PRN

## 2022-08-23 NOTE — TOC Initial Note (Signed)
Transition of Care [facility]) - Initial/Assessment Note    Patient Details  Name: Gregory Mitchell MRN: [id number] Date of Birth: 02-Dec-1940  Transition of [facility]) CM/SW Contact:    Ihor Gully, LCSW Phone Number: 08/23/2022, 2:46 PM  Clinical Narrative:                 Patient from home with step daughter. Admitted for aspiration PNA, considered high risk for readmission. Uses cane. Daughter, Apolonio Schneiders, transports to appointments.   Expected Discharge Plan: Home/Self Care     Patient Goals and CMS Choice            Expected Discharge Plan and Services                                              Prior Living Arrangements/Services   Lives with:: Adult Children              Current home services: DME (Cane)    Activities of Daily Living      Permission Sought/Granted Permission sought to share information with : Family Supports    Share Information with NAME: daughter, rachel           Emotional Assessment         Alcohol / Substance Use: Not Applicable Psych Involvement: No (comment)  Admission diagnosis:  Aspiration pneumonia ([place]) [J69.0] Patient Active Problem List   Diagnosis Date Noted   Aspiration pneumonia ([place]) 08/23/2022   Elevated brain natriuretic peptide (BNP) level 62/37/6283   Periumbilical pain 15/17/6160   Anemia 07/15/2022   Constipation due to opioid therapy 07/15/2022   Epigastric pain 07/14/2022   Acute on chronic anemia 07/14/2022   Hypoalbuminemia 07/14/2022   Upper airway cough syndrome 05/06/2022   PNA (pneumonia) 04/14/2022   Speech abnormality 04/14/2022   Chronic respiratory failure with hypoxia (HCC) 01/28/2022   IDA (iron deficiency anemia) 11/23/2021   Constipation    Atypical chest pain 09/21/2021   Prolonged QT interval 09/21/2021   Pulmonary embolism ([place]) 09/21/2021   Acute bronchitis 09/21/2021   Macrocytic anemia 08/22/2021   Gastrointestinal hemorrhage 08/22/2021   Bacterial pneumonia  10/14/2020   History of pulmonary embolism 10/14/2020   Elevated troponin 10/14/2020   Community acquired pneumonia 10/14/2020   Pressure injury of skin 10/14/2020   Chronic kidney disease, stage 3a ([place]) 10/05/2020   Chronic combined systolic and diastolic CHF (congestive heart failure) ([place]) 10/05/2020   COPD with acute exacerbation ([place]) 10/05/2020   COVID-19 virus infection 10/04/2020   GERD (gastroesophageal reflux disease) 06/12/2020   Candidal esophagitis ([place]) 05/21/2020   Esophageal dysphagia 02/20/2020   Abdominal pain, epigastric 02/20/2020   Abnormal CT of liver 02/20/2020   AKI (acute kidney injury) ([place]) 09/04/2019   Acute on chronic systolic CHF (congestive heart failure) ([place]) 09/04/2019   Trifascicular block 09/04/2019   Acute on chronic respiratory failure with hypoxia ([place]) 08/31/2019   Acute on chronic diastolic CHF (congestive heart failure) ([place]) 08/31/2019   COPD GOLD 3 / obstructive bronchiectasis by HRCT in 2015  12/21/2013   Bronchiectasis ([place]) 12/21/2013   Dyspnea on exertion 08/23/2012   Essential hypertension 08/23/2012   Fever 02/11/2012   Obesity 01/11/2012   Chest pain 11/29/2011   Cardiomyopathy, secondary ([place]) 11/29/2011   Chronic renal insufficiency    Type 2 diabetes mellitus with chronic kidney disease, without long-term current use of  insulin ([place])    PCP:  Neale Burly, MD Pharmacy:   [place], [place] [address] [place] [address] Phone: [phone] Fax: [phone]     Social Determinants of Health (SDOH) Social History: [place]: No Food Insecurity (04/15/2022)  Tobacco Use: Medium Risk (08/22/2022)   SDOH Interventions:     Readmission Risk Interventions    08/23/2021    4:24 PM 10/06/2020   11:56 AM  Readmission Risk Prevention Plan  Transportation Screening Complete Complete  PCP or Specialist Appt within 5-7 Days  Complete  PCP or Specialist Appt  within 3-5 Days Complete   Home Care Screening  Complete  Medication Review (RN CM)  Complete  HRI or Home Care Consult Complete   Social Work Consult for [place] Planning/Counseling Complete   Palliative Care Screening Not Applicable   Medication Review Press photographer) Complete

## 2022-08-23 NOTE — Progress Notes (Signed)
PROGRESS NOTE    Gregory Mitchell  EXB:[id number] DOB: Dec 23, 1940 DOA: 08/22/2022 PCP: Neale Burly, MD   Brief Narrative:  HPI: Gregory Mitchell is a 82 y.o. male with medical history significant of hypertension, asthma/COPD, chronic kidney disease, history of PE (on ongoing anticoagulation), CHF (LVEF of 20-25% on 07/15/2022) who presents to the emergency department due to increased cough and abnormal labs.  Patient followed up with his cardiologist 2 days ago and was noted to have bilateral lower lobe crackles.  Chest x-ray and labs done reflected worsening kidney function and patient was asked to go to the ED for further evaluation and management.  Patient states that he has had about 3 to 4 days of urinating less, he complained of 1 year onset of intermittent chest pain and shortness of breath which has not worsened from baseline.  Cough was productive with yellow sputum and this was reported to have been more frequent recently.  Patient endorsed being compliant with his medications and denies nausea, vomiting, abdominal pain, fever, chills.   ED Course:  In the emergency department, BP was 180/54 and other vital signs are within normal range.  Workup in the ED showed normocytic anemia and normal BMP except for creatinine of 1.59 (baseline creatinine at 1.0-1.2), BNP 564 (chronically elevated), troponin x 2 was flat at 19. Chest x-ray showed Chronic patchy consolidation, reticulation and bronchiectasis at the left greater than right lung bases, with slightly worsened left lung base consolidation, suggesting worsening of chronic/recurrent infection such as due to recurrent aspiration. Patient was treated with IV Unasyn and azithromycin, IV morphine was given, IV NS 500 mL was provided.  Hospitalist was asked to admit patient for further evaluation and management.  Assessment & Plan:   Principal Problem:   Aspiration pneumonia ([place]) Active Problems:   Type 2 diabetes mellitus with chronic  kidney disease, without long-term current use of insulin (HCC)   COPD GOLD 3 / obstructive bronchiectasis by HRCT in 2015    AKI (acute kidney injury) ([place])   GERD (gastroesophageal reflux disease)   Elevated troponin   Hypoalbuminemia   Elevated brain natriuretic peptide (BNP) level  Chronic hypoxic respiratory failure/?aspiration pneumonia: Presented with shortness of breath.  Typically uses 3 L of oxygen and currently he is on 2 to 3 L oxygen.  Has rhonchi on the lung exam, chest x-ray shows patchy consolidation but procalcitonin unremarkable raises suspicion for possible viral or atypical pneumonia, will check for COVID, flu, RSV and respiratory viral panel and in the meantime we will continue antibiotics and follow sputum culture blood culture as well as urine antigen for Legionella and streptococci.   Acute kidney injury on chronic CKD 3B Creatinine of 1.59 (baseline creatinine at 1.0-1.2) Gentle hydration was provided in the ED, creatinine improving at 1.38 today.  Avoid nephrotoxic agents, unable to provide more hydration due to low EF.   chronic systolic CHF BNP 102 (chronically elevated and appears to be at baseline) Echocardiogram done on 07/15/2022 showed LVEF of 20 to 25%.  LV has severely decreased function.  LV-global hypokinesis.  Mild LVH.  LV diastolic parameters are indeterminate Continue total input/output, daily weights and fluid restriction Continue Lipitor, Toprol XL, Avapro Continue heart healthy diet        Elevated troponin possibly secondary to type II demand ischemia Troponin x 2 was flat at 19.  Patient denied chest pain.    COPD (not in acute exacerbation) Continue Pulmicort, DuoNeb   Type 2 diabetes mellitus: Last  hemoglobin A1c in January 2023 was 6.1, rechecking hemoglobin A1c. Continue insulin sliding scale and hypoglycemia protocol   Hypoalbuminemia possibly secondary to mild protein calorie malnutrition Albumin 3.2, protein supplement to be provided    GERD Continue Protonix, Pepcid  DVT prophylaxis: enoxaparin (LOVENOX) injection 40 mg Start: 08/23/22 1000 SCDs Start: 08/23/22 0511   Code Status: Full Code  Family Communication:  None present at bedside.  Plan of care discussed with patient in length and he/she verbalized understanding and agreed with it.  Status is: Inpatient Remains inpatient appropriate because: Still symptomatic.   Estimated body mass index is 22.92 kg/m as calculated from the following:   Height as of this encounter: '5\' 6"'$  (1.676 m).   Weight as of this encounter: 64.4 kg.    Nutritional Assessment: Body mass index is 22.92 kg/m.[name] Seen by dietician.  I agree with the assessment and plan as outlined below: Nutrition Status:        . Skin Assessment: I have examined the patient's skin and I agree with the wound assessment as performed by the wound care RN as outlined below:    Consultants:  None  Procedures:  None  Antimicrobials:  Anti-infectives (From admission, onward)    Start     Dose/Rate Route Frequency Ordered Stop   08/23/22 0600  Ampicillin-Sulbactam (UNASYN) 3 g in sodium chloride 0.9 % 100 mL IVPB        3 g 200 mL/hr over 30 Minutes Intravenous Every 8 hours 08/23/22 0503     08/22/22 2245  azithromycin (ZITHROMAX) 500 mg in sodium chloride 0.9 % 250 mL IVPB        500 mg 250 mL/hr over 60 Minutes Intravenous Every 24 hours 08/22/22 2237     08/22/22 2100  Ampicillin-Sulbactam (UNASYN) 3 g in sodium chloride 0.9 % 100 mL IVPB        3 g 200 mL/hr over 30 Minutes Intravenous  Once 08/22/22 2054 08/22/22 2250         Subjective: Patient seen and examined, complains of shortness of breath but better than yesterday.  No other complaint.  Objective: Vitals:   08/23/22 0230 08/23/22 0335 08/23/22 0600 08/23/22 0903  BP: 121/64  126/65   Pulse: 66  64   Resp: 17  (!) 22   Temp:  99.2 F (37.3 C)    TempSrc:  Oral    SpO2: 100%  100% 100%  Weight:      Height:        No intake or output data in the 24 hours ending 08/23/22 1010 Filed Weights   08/22/22 1658  Weight: 64.4 kg    Examination:  General exam: Appears calm and comfortable  Respiratory system: Scattered rhonchi bilaterally. Respiratory effort normal. Cardiovascular system: S1 & S2 heard, RRR. No JVD, murmurs, rubs, gallops or clicks. No pedal edema. Gastrointestinal system: Abdomen is nondistended, soft and nontender. No organomegaly or masses felt. Normal bowel sounds heard. Central nervous system: Alert and oriented. No focal neurological deficits. Extremities: Symmetric 5 x 5 power. Skin: No rashes, lesions or ulcers Psychiatry: Judgement and insight appear normal. Mood & affect appropriate.    Data Reviewed: I have personally reviewed following labs and imaging studies  CBC: Recent Labs  Lab 08/22/22 1805 08/23/22 0552  WBC 4.9 4.6  NEUTROABS 2.5  --   HGB 8.2* 7.5*  HCT 30.9* 27.5*  MCV 98.7 96.8  PLT 231 371   Basic Metabolic Panel: Recent Labs  Lab 08/22/22 1805  08/23/22 0552  NA 140 141  K 4.4 4.1  CL 102 104  CO2 31 28  GLUCOSE 94 78  BUN 20 17  CREATININE 1.59* 1.38*  CALCIUM 8.4* 7.9*   GFR: Estimated Creatinine Clearance: 37.2 mL/min (A) (by C-G formula based on SCr of 1.38 mg/dL (H)). Liver Function Tests: Recent Labs  Lab 08/22/22 1805 08/23/22 0552  AST 20 14*  ALT 9 8  ALKPHOS 53 49  BILITOT 0.4 0.4  PROT 7.0 5.7*  ALBUMIN 3.2* 2.6*   No results for input(s): "LIPASE", "AMYLASE" in the last 168 hours. No results for input(s): "AMMONIA" in the last 168 hours. Coagulation Profile: No results for input(s): "INR", "PROTIME" in the last 168 hours. Cardiac Enzymes: No results for input(s): "CKTOTAL", "CKMB", "CKMBINDEX", "TROPONINI" in the last 168 hours. BNP (last 3 results) No results for input(s): "PROBNP" in the last 8760 hours. HbA1C: No results for input(s): "HGBA1C" in the last 72 hours. CBG: Recent Labs  Lab 08/23/22 0821   GLUCAP 86   Lipid Profile: No results for input(s): "CHOL", "HDL", "LDLCALC", "TRIG", "CHOLHDL", "LDLDIRECT" in the last 72 hours. Thyroid Function Tests: No results for input(s): "TSH", "T4TOTAL", "FREET4", "T3FREE", "THYROIDAB" in the last 72 hours. Anemia Panel: No results for input(s): "VITAMINB12", "FOLATE", "FERRITIN", "TIBC", "IRON", "RETICCTPCT" in the last 72 hours. Sepsis Labs: Recent Labs  Lab 08/23/22 0552  PROCALCITON <0.10    Recent Results (from the past 240 hour(s))  Culture, blood (Routine X 2) w Reflex to ID Panel     Status: None (Preliminary result)   Collection Time: 08/23/22  5:52 AM   Specimen: Left Antecubital; Blood  Result Value Ref Range Status   Specimen Description   Final    LEFT ANTECUBITAL BOTTLES DRAWN AEROBIC AND ANAEROBIC   Special Requests   Final    Blood Culture adequate volume Performed at [facility], [address] [place], [place] [address]    Culture PENDING  Incomplete   Report Status PENDING  Incomplete  Culture, blood (Routine X 2) w Reflex to ID Panel     Status: None (Preliminary result)   Collection Time: 08/23/22  7:19 AM   Specimen: Right Antecubital; Blood  Result Value Ref Range Status   Specimen Description   Final    RIGHT ANTECUBITAL BOTTLES DRAWN AEROBIC AND ANAEROBIC   Special Requests   Final    Blood Culture adequate volume Performed at [facility], [address], [place], [place] [address]    Culture PENDING  Incomplete   Report Status PENDING  Incomplete     Radiology Studies: DG Chest 2 View  Result Date: 08/22/2022 CLINICAL DATA:  Chest pain and dyspnea EXAM: CHEST - 2 VIEW COMPARISON:  Chest radiograph from one day prior. FINDINGS: Stable cardiomediastinal silhouette with normal heart size. No pneumothorax. No pleural effusion. No pulmonary edema. Patchy consolidation, reticulation and bronchiectasis at the left greater than right lung bases, chronic, with slightly worsened left lung base consolidation.  IMPRESSION: Chronic patchy consolidation, reticulation and bronchiectasis at the left greater than right lung bases, with slightly worsened left lung base consolidation, suggesting worsening of chronic/recurrent infection such as due to recurrent aspiration. Electronically Signed   By: Ilona Sorrel M.D.   On: 08/22/2022 18:31    Scheduled Meds:  atorvastatin  40 mg Oral Daily   budesonide  0.25 mg Nebulization BID   enoxaparin (LOVENOX) injection  40 mg Subcutaneous Q24H   famotidine  40 mg Oral Daily   feeding supplement  237 mL Oral BID BM   guaiFENesin  600 mg Oral BID   insulin aspart  0-9 Units Subcutaneous TID WC   ipratropium-albuterol  3 mL Nebulization QID   irbesartan  75 mg Oral Daily   metoprolol succinate  25 mg Oral Daily   pantoprazole  40 mg Oral Daily   Continuous Infusions:  ampicillin-sulbactam (UNASYN) IV 3 g (08/23/22 0556)   azithromycin Stopped (08/23/22 0020)     LOS: 0 days   Darliss Cheney, MD [facility]  08/23/2022, 10:10 AM   *Please note that this is a verbal dictation therefore any spelling or grammatical errors are due to the "[employer] [employer]" system interpretation.  Please page via [place] and do not message via secure chat for urgent patient care matters. Secure chat can be used for non urgent patient care matters.  How to contact the [facility] Attending or Consulting provider [place] or covering provider during after hours [place], for this patient?  Check the care team in [facility] and look for a) attending/consulting [facility] provider listed and b) the [facility] team listed. Page or secure chat [phone]. Log into [email or web address] and use [place]'s universal password to access. If you do not have the password, please contact the hospital operator. Locate the [facility] provider you are looking for under [facility] and page to a number that you can be directly reached. If you still have difficulty reaching the provider, please page the [facility] (Director on Call) for the  Hospitalists listed on amion for assistance.

## 2022-08-23 NOTE — H&P (Signed)
History and Physical    Patient: Gregory Mitchell VCB:[id number] DOB: 11-24-1940 DOA: 08/22/2022 DOS: the patient was seen and examined on 08/23/2022 PCP: Neale Burly, MD  Patient coming from: Home  Chief Complaint:  Chief Complaint  Patient presents with   Abnormal Labs   HPI: [name] is a 82 y.o. male with medical history significant of hypertension, asthma/COPD, chronic kidney disease, history of PE (on ongoing anticoagulation), CHF (LVEF of 20-25% on 07/15/2022) who presents to the emergency department due to increased cough and abnormal labs.  Patient followed up with his cardiologist 2 days ago and was noted to have bilateral lower lobe crackles.  Chest x-ray and labs done reflected worsening kidney function and patient was asked to go to the ED for further evaluation and management.  Patient states that he has had about 3 to 4 days of urinating less, he complained of 1 year onset of intermittent chest pain and shortness of breath which has not worsened from baseline.  Cough was productive with yellow sputum and this was reported to have been more frequent recently.  Patient endorsed being compliant with his medications and denies nausea, vomiting, abdominal pain, fever, chills.  ED Course:  In the emergency department, BP was 180/54 and other vital signs are within normal range.  Workup in the ED showed normocytic anemia and normal BMP except for creatinine of 1.59 (baseline creatinine at 1.0-1.2), BNP 564 (chronically elevated), troponin x 2 was flat at 19. Chest x-ray showed Chronic patchy consolidation, reticulation and bronchiectasis at the left greater than right lung bases, with slightly worsened left lung base consolidation, suggesting worsening of chronic/recurrent infection such as due to recurrent aspiration. Patient was treated with IV Unasyn and azithromycin, IV morphine was given, IV NS 500 mL was provided.  Hospitalist was asked to admit patient for further evaluation  and management.  Review of Systems: Review of systems as noted in the HPI. All other systems reviewed and are negative.   Past Medical History:  Diagnosis Date   Asthma    Chronic renal insufficiency    COPD (chronic obstructive pulmonary disease) (HCC)    Heart failure ([place])    a. EF 25-30% in 08/2019 with cath showing normal cors b. EF 20-25% in 09/2020 c. 30-35% in 02/2021 d. EF 20-25% in 10/2021   Hypertension    Pulmonary emboli ([place]) 03/2019   Renal insufficiency    Type 2 diabetes mellitus ([place])    Past Surgical History:  Procedure Laterality Date   APPENDECTOMY     BALLOON DILATION N/A 03/18/2020   Procedure: BALLOON DILATION;  Surgeon: Eloise Harman, DO;  [facility];  Service: Endoscopy;  Laterality: N/A;   CHOLECYSTECTOMY     COLONOSCOPY  10/2006   Dr.Anwar:normal   ESOPHAGEAL BRUSHING  08/24/2021   Procedure: ESOPHAGEAL BRUSHING;  Surgeon: Eloise Harman, DO;  [facility];  Service: Endoscopy;;   ESOPHAGOGASTRODUODENOSCOPY (EGD) WITH PROPOFOL N/A 03/18/2020   Procedure: ESOPHAGOGASTRODUODENOSCOPY (EGD) WITH PROPOFOL;  Surgeon: Eloise Harman, DO;  [facility];  Service: Endoscopy;  Laterality: N/A;  2:30pm   ESOPHAGOGASTRODUODENOSCOPY (EGD) WITH PROPOFOL N/A 08/24/2021   Procedure: ESOPHAGOGASTRODUODENOSCOPY (EGD) WITH PROPOFOL;  Surgeon: Eloise Harman, DO;  [facility];  Service: Endoscopy;  Laterality: N/A;  with dilation   ESOPHAGOGASTRODUODENOSCOPY (EGD) WITH PROPOFOL N/A 07/16/2022   Procedure: ESOPHAGOGASTRODUODENOSCOPY (EGD) WITH PROPOFOL;  Surgeon: Daneil Dolin, MD;  [facility];  Service: Endoscopy;  Laterality: N/A;   FLEXIBLE SIGMOIDOSCOPY N/A 08/24/2021   Procedure: FLEXIBLE SIGMOIDOSCOPY;  Surgeon: Eloise Harman, DO;  [facility];  Service: Endoscopy;  Laterality: N/A;   MALONEY DILATION  07/16/2022   Procedure: Venia Minks DILATION;  Surgeon: Daneil Dolin, MD;  [facility];  Service: Endoscopy;;   NASAL SINUS SURGERY     NEPHRECTOMY     RIGHT (Question CA.  No further therapy needed.)   RIGHT/LEFT HEART CATH AND CORONARY ANGIOGRAPHY N/A 09/03/2019   Procedure: RIGHT/LEFT HEART CATH AND CORONARY ANGIOGRAPHY;  Surgeon: Lorretta Harp, MD;  Location: [facility];  Service: Cardiovascular;  Laterality: N/A;    Social History:  reports that he quit smoking about 46 years ago. His smoking use included cigarettes. He started smoking about 65 years ago. He has a 15.00 pack-year smoking history. He quit smokeless tobacco use about 16 years ago.  His smokeless tobacco use included chew. He reports that he does not currently use alcohol. He reports that he does not use drugs.   No Known Allergies  Family History  Problem Relation Age of Onset   Stroke Mother    Breast cancer Mother    Lung cancer Brother    Colon cancer Neg Hx      Prior to Admission medications   Medication Sig Start Date End Date Taking? Authorizing Provider  albuterol (PROVENTIL) (2.5 MG/3ML) 0.083% nebulizer solution Take 3 mLs (2.5 mg total) by nebulization every 4 (four) hours as needed for wheezing or shortness of breath. 04/16/22 04/16/23  Roxan Hockey, MD  albuterol (VENTOLIN HFA) 108 (90 Base) MCG/ACT inhaler Inhale into the lungs. 07/13/22   [provider]  atorvastatin (LIPITOR) 40 MG tablet Take 1 tablet by mouth daily.    [provider]  budesonide (PULMICORT) 0.25 MG/2ML nebulizer solution Take 2 mLs (0.25 mg total) by nebulization 2 (two) times daily. 01/28/22   Tanda Rockers, MD  dapagliflozin propanediol (FARXIGA) 10 MG TABS tablet Take 1 tablet (10 mg total) by mouth daily before breakfast. 02/25/22   Arnoldo Lenis, MD  famotidine (PEPCID) 40 MG tablet Take 1 tablet by mouth daily.    [provider]  guaiFENesin (MUCINEX) 600 MG 12 hr tablet Take 1 tablet (600 mg total) by mouth 2 (two) times daily. 04/16/22 04/16/23  Roxan Hockey, MD  HM SENNA-S 8.6-50 MG tablet Take 2 tablets by mouth daily. 07/10/21   [provider]  HYDROcodone-acetaminophen (NORCO) 7.5-325 MG tablet Take 1 tablet by mouth 2 (two) times daily as needed for moderate pain. 11/27/20   [provider]  ipratropium-albuterol (DUONEB) 0.5-2.5 (3) MG/3ML SOLN Take 3 mLs by nebulization 4 (four) times daily. 06/30/22   [provider]  metoprolol succinate (TOPROL XL) 25 MG 24 hr tablet Take 1 tablet (25 mg total) by mouth daily. 12/18/21   Strader, Fransisco Hertz, PA-C  mirtazapine (REMERON) 15 MG tablet Take 15 mg by mouth daily. 07/07/22   [provider]  OXYGEN Inhale 3 L into the lungs daily as needed (for shortness of breath).    [provider]  pantoprazole (PROTONIX) 40 MG tablet Take 1 tablet (40 mg total) by mouth daily. 04/16/22   Roxan Hockey, MD  tamsulosin (FLOMAX) 0.4 MG CAPS capsule Take 0.8 mg by mouth 2 (two) times daily.    [provider]  torsemide (DEMADEX) 20 MG tablet Take 1 tablet (20 mg total) by mouth as needed (swelling). Patient taking  differently: Take 20 mg by mouth daily as needed (swelling). 03/23/22   Arnoldo Lenis, MD  valsartan (DIOVAN) 80 MG tablet Take 80 mg by mouth daily. 07/13/22   [provider]    Physical Exam: BP 121/64   Pulse 66   Temp 99.2 F (37.3 C) (Oral)   Resp 17   Ht '5\' 6"'$  (1.676 m)   Wt 64.4 kg   SpO2 100%   BMI 22.92 kg/m   General: 82 y.o. year-old male ill appearing, but in no acute distress.  Alert and oriented x3. HEENT: NCAT, EOMI Neck: Supple, trachea medial Cardiovascular: Regular rate and rhythm with no rubs or gallops.  No thyromegaly or JVD noted.  +2 lower extremity edema bilaterally. 2/4 pulses in all 4 extremities. Respiratory: Bilateral Rales in lower lobes on auscultation.   Abdomen: Soft, nontender nondistended with normal bowel sounds x4 quadrants. Muskuloskeletal: No cyanosis, clubbing or edema noted  bilaterally Neuro: CN II-XII intact, strength 5/5 x 4, sensation, reflexes intact Skin: No ulcerative lesions noted or rashes Psychiatry: Judgement and insight appear normal. Mood is appropriate for condition and setting          Labs on Admission:  Basic Metabolic Panel: Recent Labs  Lab 08/22/22 1805  NA 140  K 4.4  CL 102  CO2 31  GLUCOSE 94  BUN 20  CREATININE 1.59*  CALCIUM 8.4*   Liver Function Tests: Recent Labs  Lab 08/22/22 1805  AST 20  ALT 9  ALKPHOS 53  BILITOT 0.4  PROT 7.0  ALBUMIN 3.2*   No results for input(s): "LIPASE", "AMYLASE" in the last 168 hours. No results for input(s): "AMMONIA" in the last 168 hours. CBC: Recent Labs  Lab 08/22/22 1805  WBC 4.9  NEUTROABS 2.5  HGB 8.2*  HCT 30.9*  MCV 98.7  PLT 231   Cardiac Enzymes: No results for input(s): "CKTOTAL", "CKMB", "CKMBINDEX", "TROPONINI" in the last 168 hours.  BNP (last 3 results) Recent Labs    05/28/22 1728 07/14/22 1809 08/22/22 1807  BNP 746.0* 456.0* 564.0*    ProBNP (last 3 results) No results for input(s): "PROBNP" in the last 8760 hours.  CBG: No results for input(s): "GLUCAP" in the last 168 hours.  Radiological Exams on Admission: DG Chest 2 View  Result Date: 08/22/2022 CLINICAL DATA:  Chest pain and dyspnea EXAM: CHEST - 2 VIEW COMPARISON:  Chest radiograph from one day prior. FINDINGS: Stable cardiomediastinal silhouette with normal heart size. No pneumothorax. No pleural effusion. No pulmonary edema. Patchy consolidation, reticulation and bronchiectasis at the left greater than right lung bases, chronic, with slightly worsened left lung base consolidation. IMPRESSION: Chronic patchy consolidation, reticulation and bronchiectasis at the left greater than right lung bases, with slightly worsened left lung base consolidation, suggesting worsening of chronic/recurrent infection such as due to recurrent aspiration. Electronically Signed   By: Ilona Sorrel M.D.   On:  08/22/2022 18:31    EKG: I independently viewed the EKG done and my findings are as followed: Sinus rhythm or ectopic atrial rhythm at a rate of 69 bpm  Assessment/Plan Present on Admission:  Aspiration pneumonia (HCC)  Elevated troponin  AKI (acute kidney injury) ([place])  COPD GOLD 3 / obstructive bronchiectasis by HRCT in 2015   GERD (gastroesophageal reflux disease)  Hypoalbuminemia  Type 2 diabetes mellitus with chronic kidney disease, without long-term current use of insulin (HCC)  Principal Problem:   Aspiration pneumonia (HCC) Active Problems:   Type 2 diabetes mellitus with chronic  kidney disease, without long-term current use of insulin (HCC)   COPD GOLD 3 / obstructive bronchiectasis by HRCT in 2015    AKI (acute kidney injury) (HCC)   GERD (gastroesophageal reflux disease)   Elevated troponin   Hypoalbuminemia   Elevated brain natriuretic peptide (BNP) level  Aspiration pneumonia Patient was started on Unasyn and azithromycin, we shall continue same at this time with plan to de-escalate/discontinue based on blood culture, sputum culture, urine Legionella, strep pneumo and procalcitonin Continue Tylenol as needed Continue Mucinex, incentive spirometry, flutter valve   Acute kidney injury on chronic CKD 3B Creatinine of 1.59 (baseline creatinine at 1.0-1.2) Gentle hydration was provided in the ED Renally adjust medications, avoid nephrotoxic agents/dehydration/hypotension  Elevated BNP, rule out acute on chronic systolic CHF BNP 557 (chronically elevated) Echocardiogram done on 07/15/2022 showed LVEF of 20 to 25%.  LV has severely decreased function.  LV-global hypokinesis.  Mild LVH.  LV diastolic parameters are indeterminate Continue total input/output, daily weights and fluid restriction Continue Lipitor, Toprol XL, Avapro Continue heart healthy diet   Elevated troponin possibly secondary to type II demand ischemia Troponin x 2 was flat at 19.  Patient denied chest  pain at this time  COPD (not in acute exacerbation) Continue Pulmicort, DuoNeb  Type 2 diabetes mellitus Continue insulin sliding scale and hypoglycemia protocol  Hypoalbuminemia possibly secondary to mild protein calorie malnutrition Albumin 3.2, protein supplement to be provided  GERD Continue Protonix, Pepcid  Consults called: None  Severity of Illness: The appropriate patient status for this patient is INPATIENT. Inpatient status is judged to be reasonable and necessary in order to provide the required intensity of service to ensure the patient's safety. The patient's presenting symptoms, physical exam findings, and initial radiographic and laboratory data in the context of their chronic comorbidities is felt to place them at high risk for further clinical deterioration. Furthermore, it is not anticipated that the patient will be medically stable for discharge from the hospital within 2 midnights of admission.   * I certify that at the point of admission it is my clinical judgment that the patient will require inpatient hospital care spanning beyond 2 midnights from the point of admission due to high intensity of service, high risk for further deterioration and high frequency of surveillance required.*  Author: Bernadette Hoit, DO 08/23/2022 5:23 AM  For on call review [email or web address].

## 2022-08-23 NOTE — Progress Notes (Signed)
Added O2 extension to patient's [place].

## 2022-08-24 ENCOUNTER — Other Ambulatory Visit: Payer: Self-pay | Admitting: Family Medicine

## 2022-08-24 DIAGNOSIS — J690 Pneumonitis due to inhalation of food and vomit: Secondary | ICD-10-CM | POA: Diagnosis not present

## 2022-08-24 LAB — CBC WITH DIFFERENTIAL/PLATELET
Abs Immature Granulocytes: 0.02 10*3/uL (ref 0.00–0.07)
Basophils Absolute: 0 10*3/uL (ref 0.0–0.1)
Basophils Relative: 1 %
Eosinophils Absolute: 0.2 10*3/uL (ref 0.0–0.5)
Eosinophils Relative: 5 %
HCT: 27.1 % — ABNORMAL LOW (ref 39.0–52.0)
Hemoglobin: 7.8 g/dL — ABNORMAL LOW (ref 13.0–17.0)
Immature Granulocytes: 1 %
Lymphocytes Relative: 37 %
Lymphs Abs: 1.6 10*3/uL (ref 0.7–4.0)
MCH: 26.8 pg (ref 26.0–34.0)
MCHC: 28.8 g/dL — ABNORMAL LOW (ref 30.0–36.0)
MCV: 93.1 fL (ref 80.0–100.0)
Monocytes Absolute: 0.4 10*3/uL (ref 0.1–1.0)
Monocytes Relative: 10 %
Neutro Abs: 2.1 10*3/uL (ref 1.7–7.7)
Neutrophils Relative %: 46 %
Platelets: 223 10*3/uL (ref 150–400)
RBC: 2.91 MIL/uL — ABNORMAL LOW (ref 4.22–5.81)
RDW: 14.9 % (ref 11.5–15.5)
WBC: 4.3 10*3/uL (ref 4.0–10.5)
nRBC: 0 % (ref 0.0–0.2)

## 2022-08-24 LAB — BASIC METABOLIC PANEL
Anion gap: 8 (ref 5–15)
BUN: 15 mg/dL (ref 8–23)
CO2: 30 mmol/L (ref 22–32)
Calcium: 7.9 mg/dL — ABNORMAL LOW (ref 8.9–10.3)
Chloride: 103 mmol/L (ref 98–111)
Creatinine, Ser: 1.41 mg/dL — ABNORMAL HIGH (ref 0.61–1.24)
GFR, Estimated: 50 mL/min — ABNORMAL LOW (ref >60)
Glucose, Bld: 85 mg/dL (ref 70–99)
Potassium: 4 mmol/L (ref 3.5–5.1)
Sodium: 141 mmol/L (ref 135–145)

## 2022-08-24 LAB — GLUCOSE, CAPILLARY: Glucose-Capillary: 91 mg/dL (ref 70–99)

## 2022-08-24 LAB — HEMOGLOBIN A1C
Hgb A1c MFr Bld: 5.9 % — ABNORMAL HIGH (ref 4.8–5.6)
Mean Plasma Glucose: 123 mg/dL

## 2022-08-24 MED ORDER — AMOXICILLIN-POT CLAVULANATE 875-125 MG PO TABS: 1.0000 | ORAL_TABLET | Freq: Two times a day (BID) | ORAL | 0 refills | Status: AC

## 2022-08-24 MED ORDER — PREDNISONE 50 MG PO TABS: 50.0000 mg | ORAL_TABLET | Freq: Every day | ORAL | 0 refills | Status: AC

## 2022-08-24 NOTE — Evaluation (Signed)
Occupational Therapy Evaluation Patient Details Name: Gregory Mitchell MRN: [id number] DOB: 11-21-1940 Today's Date: 08/24/2022   History of Present Illness Gregory Mitchell is a 82 y.o. male with medical history significant of hypertension, asthma/COPD, chronic kidney disease, history of PE (on ongoing anticoagulation), CHF (LVEF of 20-25% on 07/15/2022) who presents to the emergency department due to increased cough and abnormal labs.  Patient followed up with his cardiologist 2 days ago and was noted to have bilateral lower lobe crackles.  Chest x-ray and labs done reflected worsening kidney function and patient was asked to go to the ED for further evaluation and management.  Patient states that he has had about 3 to 4 days of urinating less, he complained of 1 year onset of intermittent chest pain and shortness of breath which has not worsened from baseline.  Cough was productive with yellow sputum and this was reported to have been more frequent recently.  Patient endorsed being compliant with his medications and denies nausea, vomiting, abdominal pain, fever, chills.   Clinical Impression   Pt agreeable to OT and PT co-evaluation. Pt generally weak with limited A/ROM of shoulders which pt reported as new. Pt is generally weak and requires extended time for seated lower extremity ADL's. Pt is also unsteady in standing needing RW and min G to min A while ambulating. Pt SpO2 dropped to 74 % while ambulating but returned to 88% towards the end of the walk. Pt was placed back on 2 L supplemental O2 and went up to 100% SpO2. Pt will benefit from continued OT in the hospital and recommended venue below to increase strength, balance, and endurance for safe ADL's.         Recommendations for follow up therapy are one component of a multi-disciplinary discharge planning process, led by the attending physician.  Recommendations may be updated based on patient status, additional functional criteria and insurance  authorization.   Follow Up Recommendations  Home health OT     Assistance Recommended at Discharge Intermittent Supervision/Assistance  Patient can return home with the following A little help with walking and/or transfers;A little help with bathing/dressing/bathroom;Assistance with cooking/housework;Assist for transportation;Help with stairs or ramp for entrance    Functional Status Assessment  Patient has had a recent decline in their functional status and demonstrates the ability to make significant improvements in function in a reasonable and predictable amount of time.  Equipment Recommendations  None recommended by OT    Recommendations for Other Services       Precautions / Restrictions Precautions Precautions: Fall Restrictions Weight Bearing Restrictions: No      Mobility Bed Mobility Overal bed mobility: Modified Independent                  Transfers Overall transfer level: Needs assistance Equipment used: Rolling walker (2 wheels) Transfers: Sit to/from Stand, Bed to chair/wheelchair/BSC Sit to Stand: Min guard, Min assist     Step pivot transfers: Min guard, Min assist     General transfer comment: Slow labored movement; unsteady.      Balance Overall balance assessment: Needs assistance Sitting-balance support: No upper extremity supported, Feet supported Sitting balance-Leahy Scale: Good Sitting balance - Comments: seated EOB   Standing balance support: Bilateral upper extremity supported, During functional activity, Reliant on assistive device for balance Standing balance-Leahy Scale: Fair Standing balance comment: poor to fair with RW.  ADL either performed or assessed with clinical judgement   ADL Overall ADL's : Needs assistance/impaired     Grooming: Min guard;Minimal assistance;Standing   Upper Body Bathing: Minimal assistance;Sitting   Lower Body Bathing: Min guard;Sitting/lateral leans    Upper Body Dressing : Minimal assistance;Sitting   Lower Body Dressing: Min guard;Sitting/lateral leans Lower Body Dressing Details (indicate cue type and reason): Pt able to doff and don L sock with extended time and labored effort while seated at EOB. Toilet Transfer: Minimal assistance;Rolling walker (2 wheels);Min guard;Ambulation Toilet Transfer Details (indicate cue type and reason): Simulated via EOB to chair as well as ambulation in hall with RW.         Functional mobility during ADLs: Min guard;Minimal assistance;Rolling walker (2 wheels)       Vision Baseline Vision/History: 1 Wears glasses Ability to See in Adequate Light: 0 Adequate Patient Visual Report: No change from baseline Vision Assessment?: No apparent visual deficits                Pertinent Vitals/Pain Pain Assessment Pain Assessment: No/denies pain     Hand Dominance Right   Extremity/Trunk Assessment Upper Extremity Assessment Upper Extremity Assessment: Generalized weakness   Lower Extremity Assessment Lower Extremity Assessment: Defer to PT evaluation   Cervical / Trunk Assessment Cervical / Trunk Assessment: Kyphotic   Communication Communication Communication: No difficulties   Cognition Arousal/Alertness: Awake/alert Behavior During Therapy: WFL for tasks assessed/performed Overall Cognitive Status: Within Functional Limits for tasks assessed                                                        Home Living Family/patient expects to be discharged to:: Private residence Living Arrangements: Other relatives Available Help at Discharge: Family;Available 24 hours/day Type of Home: House Home Access: Level entry     Home Layout: One level     Bathroom Shower/Tub: Teacher, early years/pre: Handicapped height Bathroom Accessibility: Yes How Accessible: Accessible via walker;Accessible via wheelchair Home Equipment: Hand held shower head;Grab  bars - tub/shower;Cane - single Barista (2 wheels);Wheelchair - manual;Shower seat   Additional Comments: No change from previous living situation per pt.      Prior Functioning/Environment Prior Level of Function : Independent/Modified Independent             Mobility Comments: Community ambulator without AD, drives ADLs Comments: Independent        OT Problem List: Decreased strength;Decreased range of motion;Decreased activity tolerance      OT Treatment/Interventions: Self-care/ADL training;Therapeutic exercise;Therapeutic activities;Balance training;Patient/family education    OT Goals(Current goals can be found in the care plan section) Acute Rehab OT Goals Patient Stated Goal: return home OT Goal Formulation: With patient Time For Goal Achievement: 09/07/22 Potential to Achieve Goals: Good  OT Frequency: Min 1X/week    Co-evaluation PT/OT/SLP Co-Evaluation/Treatment: Yes Reason for Co-Treatment: To address functional/ADL transfers   OT goals addressed during session: ADL's and self-care                       End of Session Equipment Utilized During Treatment: Rolling walker (2 wheels);Oxygen  Activity Tolerance: Patient tolerated treatment well Patient left: with call bell/phone within reach;in chair  OT Visit Diagnosis: Unsteadiness on feet (R26.81);Other abnormalities of gait and mobility (R26.89);Muscle weakness (generalized) (  M62.81)                Time: 1587-2761 OT Time Calculation (min): 16 min Charges:  OT General Charges $OT Visit: 1 Visit OT Evaluation $OT Eval Low Complexity: 1 Low  Zayde Stroupe OT, MOT  Larey Seat 08/24/2022, 9:44 AM

## 2022-08-24 NOTE — Progress Notes (Addendum)
Patient complains that he is unable to sleep. Message sent to Dr. Josephine Cables. 1 time dose of '6mg'$  of melatonin ordered and administered. Melatonin effective. IV abx completed. No s/sx of adverse reaction noted at this time. Patient keeps saying that he is going home today (Tuesday 08/24/2022). Patient up to the bathroom with 2 assist.

## 2022-08-24 NOTE — Evaluation (Signed)
Physical Therapy Evaluation Patient Details Name: Gregory Mitchell MRN: [id number] DOB: [birth year] Today's Date: 08/24/2022  History of Present Illness  Gregory Mitchell is a 82 y.o. male with medical history significant of hypertension, asthma/COPD, chronic kidney disease, history of PE (on ongoing anticoagulation), CHF (LVEF of 20-25% on 07/15/2022) who presents to the emergency department due to increased cough and abnormal labs.  Patient followed up with his cardiologist 2 days ago and was noted to have bilateral lower lobe crackles.  Chest x-ray and labs done reflected worsening kidney function and patient was asked to go to the ED for further evaluation and management.  Patient states that he has had about 3 to 4 days of urinating less, he complained of 1 year onset of intermittent chest pain and shortness of breath which has not worsened from baseline.  Cough was productive with yellow sputum and this was reported to have been more frequent recently.  Patient endorsed being compliant with his medications and denies nausea, vomiting, abdominal pain, fever, chills.   Clinical Impression  Patient unable to complete sit to stand using SPC due to weakness, required use of RW for safety and demonstrated fair/good return for sue ambulating in room/hallway without loss of balance. Patient tolerated sitting up in chair after therapy.  PLAN:  Patient to be discharged home today and discharged from acute physical therapy to care of nursing for ambulation as tolerated for length of stay with recommendations stated below         Recommendations for follow up therapy are one component of a multi-disciplinary discharge planning process, led by the attending physician.  Recommendations may be updated based on patient status, additional functional criteria and insurance authorization.  Follow Up Recommendations Home health PT      Assistance Recommended at Discharge Set up Supervision/Assistance  Patient can  return home with the following  A little help with walking and/or transfers;A little help with bathing/dressing/bathroom;Help with stairs or ramp for entrance;Assistance with cooking/housework    Equipment Recommendations None recommended by PT  Recommendations for Other Services       Functional Status Assessment Patient has had a recent decline in their functional status and demonstrates the ability to make significant improvements in function in a reasonable and predictable amount of time.     Precautions / Restrictions Precautions Precautions: Fall Restrictions Weight Bearing Restrictions: No      Mobility  Bed Mobility Overal bed mobility: Modified Independent                  Transfers Overall transfer level: Needs assistance Equipment used: Rolling walker (2 wheels) Transfers: Sit to/from Stand, Bed to chair/wheelchair/BSC Sit to Stand: Min guard, Min assist   Step pivot transfers: Min guard, Min assist       General transfer comment: unable to stand using SPC due to weakness, required use of RW due to BLE weakness    Ambulation/Gait Ambulation/Gait assistance: Min guard, Supervision Gait Distance (Feet): 85 Feet Assistive device: Rolling walker (2 wheels) Gait Pattern/deviations: Decreased step length - right, Decreased step length - left, Decreased stride length, Drifts right/left Gait velocity: decreased     General Gait Details: slightly labored cadence with occasional drifting right/left without loss of balance, limited mostly due to fatigue  Stairs            Wheelchair Mobility    Modified Rankin (Stroke Patients Only)       Balance Overall balance assessment: Needs assistance Sitting-balance support: Feet supported,  No upper extremity supported Sitting balance-Leahy Scale: Good Sitting balance - Comments: seated EOB   Standing balance support: During functional activity, Single extremity supported Standing balance-Leahy Scale:  Poor Standing balance comment: fair using RW                             Pertinent Vitals/Pain Pain Assessment Pain Assessment: No/denies pain    Home Living Family/patient expects to be discharged to:: Private residence Living Arrangements: Other relatives Available Help at Discharge: Family;Available 24 hours/day Type of Home: House Home Access: Level entry       Home Layout: One level Home Equipment: Hand held shower head;Grab bars - tub/shower;Cane - single Barista (2 wheels);Wheelchair - manual;Shower seat Additional Comments: No change from previous living situation per pt.    Prior Function Prior Level of Function : Independent/Modified Independent             Mobility Comments: Community ambulator without AD, drives ADLs Comments: Independent     Hand Dominance   Dominant Hand: Right    Extremity/Trunk Assessment   Upper Extremity Assessment Upper Extremity Assessment: Defer to OT evaluation    Lower Extremity Assessment Lower Extremity Assessment: Generalized weakness    Cervical / Trunk Assessment Cervical / Trunk Assessment: Kyphotic  Communication   Communication: No difficulties  Cognition Arousal/Alertness: Awake/alert Behavior During Therapy: WFL for tasks assessed/performed Overall Cognitive Status: Within Functional Limits for tasks assessed                                          General Comments      Exercises     Assessment/Plan    PT Assessment All further PT needs can be met in the next venue of care  PT Problem List Decreased strength;Decreased activity tolerance;Decreased balance;Decreased mobility       PT Treatment Interventions      PT Goals (Current goals can be found in the Care Plan section)  Acute Rehab PT Goals Patient Stated Goal: return home with family to assist PT Goal Formulation: With patient Time For Goal Achievement: 08/24/22 Potential to Achieve Goals:  Good    Frequency       Co-evaluation PT/OT/SLP Co-Evaluation/Treatment: Yes Reason for Co-Treatment: To address functional/ADL transfers           AM-PAC PT "6 Clicks" Mobility  Outcome Measure Help needed turning from your back to your side while in a flat bed without using bedrails?: None Help needed moving from lying on your back to sitting on the side of a flat bed without using bedrails?: None Help needed moving to and from a bed to a chair (including a wheelchair)?: A Little Help needed standing up from a chair using your arms (e.g., wheelchair or bedside chair)?: A Little Help needed to walk in hospital room?: A Little Help needed climbing 3-5 steps with a railing? : A Lot 6 Click Score: 19    End of Session   Activity Tolerance: Patient tolerated treatment well;Patient limited by fatigue Patient left: in chair;with call bell/phone within reach Nurse Communication: Mobility status PT Visit Diagnosis: Unsteadiness on feet (R26.81);Other abnormalities of gait and mobility (R26.89);Muscle weakness (generalized) (M62.81)    Time: 0174-9449 PT Time Calculation (min) (ACUTE ONLY): 25 min   Charges:   PT Evaluation $PT Eval Moderate Complexity: 1 Mod PT Treatments $  Therapeutic Activity: 23-37 mins        1:55 PM, 08/24/22 Lonell Grandchild, MPT Physical Therapist with [facility] [phone] office [phone] mobile phone

## 2022-08-24 NOTE — TOC Transition Note (Signed)
Transition of Care [facility]) - CM/SW Discharge Note   Patient Details  Name: Gregory Mitchell MRN: [id number] Date of Birth: [birth year]  Transition of [facility]) CM/SW Contact:  Salome Arnt, LCSW Phone Number: 08/24/2022, 11:09 AM   Clinical Narrative:  Pt d/c today. PT/OT recommending home health. LCSW discussed with pt's daughter on phone as pt has already left hospital. Agreeable to home health and requests [facility]. Referred and accepted. Daughter also requests rolling walker. Kristin with [employer] notified to drop ship. All orders in.      Final next level of care: [place] Barriers to Discharge: Barriers Resolved   Patient Goals and CMS Choice      Discharge Placement                      Patient and family notified of of transfer: 08/24/22  Discharge Plan and Services Additional resources added to the After Visit Summary for                  DME Arranged: Walker rolling DME Agency: [employer] Date DME Agency Contacted: 08/24/22 Time DME Agency Contacted: [id number] Representative spoke with at DME Agency: [place]: PT, OT [place] Agency: [place] Date [place]: 08/24/22 Time [place]: 1108 Representative spoke with at [place]: [place] Determinants of Health ([place]) Interventions [place]: No Food Insecurity (08/23/2022)  Housing: Low Risk  (08/23/2022)  Transportation Needs: No Transportation Needs (08/23/2022)  Utilities: Not At Risk (08/23/2022)  Tobacco Use: Medium Risk (08/22/2022)     Readmission Risk Interventions    08/23/2021    4:24 PM 10/06/2020   11:56 AM  Readmission Risk Prevention Plan  Transportation Screening Complete Complete  PCP or Specialist Appt within 5-7 Days  Complete  PCP or Specialist Appt within 3-5 Days Complete   Home Care Screening  Complete  Medication Review (RN CM)  Complete  HRI or Home Care Consult Complete    Social Work Consult for [place] Planning/Counseling Complete   Palliative Care Screening Not Applicable   Medication Review Press photographer) Complete

## 2022-08-24 NOTE — Discharge Summary (Signed)
Physician Discharge Summary  Gregory Mitchell IRW:[id number] DOB: November 08, 1940 DOA: 08/22/2022  PCP: Neale Burly, MD  Admit date: 08/22/2022 Discharge date: 08/24/2022 30 Day Unplanned Readmission Risk Score    Flowsheet Row ED to Hosp-Admission (Current) from 08/22/2022 in [facility]  30 Day Unplanned Readmission Risk Score (%) 25.9 Filed at 08/24/2022 0801       This score is the patient's risk of an unplanned readmission within 30 days of being discharged (0 -100%). The score is based on dignosis, age, lab data, medications, orders, and past utilization.   Low:  0-14.9   Medium: 15-21.9   High: 22-29.9   Extreme: 30 and above          Admitted From: Home Disposition: Home  Recommendations for Outpatient Follow-up:  Follow up with PCP in 1-2 weeks Please obtain BMP/CBC in one week Please follow up with your PCP on the following pending results: Unresulted Labs (From admission, onward)     Start     Ordered   08/30/22 0500  Creatinine, serum  (enoxaparin (LOVENOX)    CrCl >/= 30 ml/min)  Weekly,   R     Comments: while on enoxaparin therapy    08/23/22 0510   08/23/22 0502  Legionella Pneumophila Serogp 1 Ur Ag  Once,   R        08/23/22 0503   08/23/22 0502  Expectorated Sputum Assessment w Gram Stain, Rflx to Resp Cult  Once,   R        08/23/22 [place]: None Equipment/Devices: None  Discharge Condition: Stable CODE STATUS: Full code Diet recommendation: Cardiac  HPI: Gregory Mitchell is a 82 y.o. male with medical history significant of hypertension, asthma/COPD, chronic kidney disease, history of PE (on ongoing anticoagulation), CHF (LVEF of 20-25% on 07/15/2022) who presents to the emergency department due to increased cough and abnormal labs.  Patient followed up with his cardiologist 2 days ago and was noted to have bilateral lower lobe crackles.  Chest x-ray and labs done reflected worsening kidney function and  patient was asked to go to the ED for further evaluation and management.  Patient states that he has had about 3 to 4 days of urinating less, he complained of 1 year onset of intermittent chest pain and shortness of breath which has not worsened from baseline.  Cough was productive with yellow sputum and this was reported to have been more frequent recently.  Patient endorsed being compliant with his medications and denies nausea, vomiting, abdominal pain, fever, chills.   ED Course:  In the emergency department, BP was 180/54 and other vital signs are within normal range.  Workup in the ED showed normocytic anemia and normal BMP except for creatinine of 1.59 (baseline creatinine at 1.0-1.2), BNP 564 (chronically elevated), troponin x 2 was flat at 19. Chest x-ray showed Chronic patchy consolidation, reticulation and bronchiectasis at the left greater than right lung bases, with slightly worsened left lung base consolidation, suggesting worsening of chronic/recurrent infection such as due to recurrent aspiration. Patient was treated with IV Unasyn and azithromycin, IV morphine was given, IV NS 500 mL was provided.  Hospitalist was asked to admit patient for further evaluation and management.  Subjective: Seen and examined.  He says that he feels well and he also said that he is ready to go home.  Denied any shortness of breath or any  other complaint.  He says that he quitted smoking long time ago.  Brief/Interim Summary: Patient was admitted to hospital service with chronic hypoxic respiratory failure secondary to an aspiration pneumonia.  Details below.  Chronic hypoxic respiratory failure/?aspiration pneumonia/acute COPD exacerbation.: Presented with shortness of breath.  Typically uses 3 L of oxygen and currently he is on 2 to 3 L oxygen.  Has rhonchi on the lung exam, chest x-ray shows patchy consolidation but procalcitonin unremarkable raises suspicion for possible viral or atypical pneumonia,  however he was tested negative for COVID, flu, RSV and respiratory viral panel.  He is doing fine, he wants to go home and feels stable however on exam today, he has mild rhonchi, better than yesterday but he does have expiratory wheezes bilaterally indicating mild COPD exacerbation as well.  Since he is not requiring any more oxygen than he normally does, he seems to be stable for discharge.  I will discharge him on 7 days of Augmentin for possible aspiration pneumonia as well as 5 days of prednisone for COPD exacerbation.   CKD stage IIIa: Upon extensive chart review, it appears that patient has history of CKD stage IIIa and based on that, his creatinine is at baseline.  Acute kidney injury documented in the previous note was drawn and AKI ruled out.   chronic systolic CHF BNP 852 (chronically elevated and appears to be at baseline) Echocardiogram done on 07/15/2022 showed LVEF of 20 to 25%.  LV has severely decreased function.  LV-global hypokinesis.  Mild LVH.  LV diastolic parameters are indeterminate Continue total input/output, daily weights and fluid restriction Continue Lipitor, Toprol XL, Avapro Continue heart healthy diet        Elevated troponin possibly secondary to type II demand ischemia Troponin x 2 was flat at 19.  Patient denied chest pain.    Type 2 diabetes mellitus: Last hemoglobin A1c in January 2023 was 6.1, resume home medications.   GERD Continue Protonix, Pepcid  Discharge plan was discussed with patient and/or family member and they verbalized understanding and agreed with it.  Discharge Diagnoses:  Principal Problem:   Aspiration pneumonia ([place]) Active Problems:   Type 2 diabetes mellitus with chronic kidney disease, without long-term current use of insulin (HCC)   Essential hypertension   COPD GOLD 3 / obstructive bronchiectasis by HRCT in 2015    GERD (gastroesophageal reflux disease)   Chronic kidney disease, stage 3a (HCC)   Elevated troponin    Hypoalbuminemia   Elevated brain natriuretic peptide (BNP) level    Discharge Instructions   Allergies as of 08/24/2022   No Known Allergies      Medication List     TAKE these medications    albuterol (2.5 MG/3ML) 0.083% nebulizer solution Commonly known as: PROVENTIL Take 3 mLs (2.5 mg total) by nebulization every 4 (four) hours as needed for wheezing or shortness of breath.   albuterol 108 (90 Base) MCG/ACT inhaler Commonly known as: VENTOLIN HFA Inhale 1-2 puffs into the lungs every 4 (four) hours as needed for shortness of breath.   amoxicillin-clavulanate 875-125 MG tablet Commonly known as: AUGMENTIN Take 1 tablet by mouth 2 (two) times daily for 7 days.   atorvastatin 40 MG tablet Commonly known as: LIPITOR Take 1 tablet by mouth daily.   budesonide 0.25 MG/2ML nebulizer solution Commonly known as: Pulmicort Take 2 mLs (0.25 mg total) by nebulization 2 (two) times daily.   dapagliflozin propanediol 10 MG Tabs tablet Commonly known as: Farxiga Take 1 tablet (  10 mg total) by mouth daily before breakfast.   famotidine 40 MG tablet Commonly known as: PEPCID Take 1 tablet by mouth daily.   guaiFENesin 600 MG 12 hr tablet Commonly known as: Mucinex Take 1 tablet (600 mg total) by mouth 2 (two) times daily.   HM Senna-S 8.6-50 MG tablet Generic drug: senna-docusate Take 2 tablets by mouth daily.   HYDROcodone-acetaminophen 7.5-325 MG tablet Commonly known as: NORCO Take 1 tablet by mouth 2 (two) times daily as needed for moderate pain.   ipratropium-albuterol 0.5-2.5 (3) MG/3ML Soln Commonly known as: DUONEB Take 3 mLs by nebulization every 6 (six) hours as needed (shortness of breath).   metoprolol succinate 25 MG 24 hr tablet Commonly known as: Toprol XL Take 1 tablet (25 mg total) by mouth daily.   mirtazapine 15 MG tablet Commonly known as: REMERON Take 15 mg by mouth daily.   OXYGEN Inhale 3 L into the lungs daily as needed (for shortness of  breath).   pantoprazole 40 MG tablet Commonly known as: PROTONIX Take 1 tablet (40 mg total) by mouth daily. What changed: when to take this   predniSONE 50 MG tablet Commonly known as: DELTASONE Take 1 tablet (50 mg total) by mouth daily with breakfast for 5 days.   tamsulosin 0.4 MG Caps capsule Commonly known as: FLOMAX Take 0.8 mg by mouth 2 (two) times daily.   torsemide 20 MG tablet Commonly known as: DEMADEX Take 1 tablet (20 mg total) by mouth as needed (swelling). What changed: when to take this   valsartan 80 MG tablet Commonly known as: DIOVAN Take 80 mg by mouth daily.        Follow-up Information     Neale Burly, MD Follow up in 1 week(s).   Specialty: Internal Medicine Contact information: [place] [address] [phone]                No Known Allergies  Consultations: None   Procedures/Studies: DG Chest 2 View  Result Date: 08/22/2022 CLINICAL DATA:  Chest pain and dyspnea EXAM: CHEST - 2 VIEW COMPARISON:  Chest radiograph from one day prior. FINDINGS: Stable cardiomediastinal silhouette with normal heart size. No pneumothorax. No pleural effusion. No pulmonary edema. Patchy consolidation, reticulation and bronchiectasis at the left greater than right lung bases, chronic, with slightly worsened left lung base consolidation. IMPRESSION: Chronic patchy consolidation, reticulation and bronchiectasis at the left greater than right lung bases, with slightly worsened left lung base consolidation, suggesting worsening of chronic/recurrent infection such as due to recurrent aspiration. Electronically Signed   By: Ilona Sorrel M.D.   On: 08/22/2022 18:31     Discharge Exam: Vitals:   08/24/22 0751 08/24/22 0753  BP:    Pulse:    Resp:    Temp:    SpO2: 96% 98%   Vitals:   08/24/22 0230 08/24/22 0513 08/24/22 0751 08/24/22 0753  BP:  128/83    Pulse:  84    Resp:  18    Temp:  97.6 F (36.4 C)    TempSrc:      SpO2:   100% 96% 98%  Weight: 61.9 kg     Height:        General: Pt is alert, awake, not in acute distress Cardiovascular: RRR, S1/S2 +, no rubs, no gallops Respiratory: Very faint bilateral rhonchi but bilateral diffuse expiratory wheezes. Abdominal: Soft, NT, ND, bowel sounds + Extremities: no edema, no cyanosis    The  results of significant diagnostics from this hospitalization (including imaging, microbiology, ancillary and laboratory) are listed below for reference.     Microbiology: Recent Results (from the past 240 hour(s))  Culture, blood (Routine X 2) w Reflex to ID Panel     Status: None (Preliminary result)   Collection Time: 08/23/22  5:52 AM   Specimen: Left Antecubital; Blood  Result Value Ref Range Status   Specimen Description   Final    LEFT ANTECUBITAL BOTTLES DRAWN AEROBIC AND ANAEROBIC   Special Requests Blood Culture adequate volume  Final   Culture   Final    NO GROWTH 1 DAY Performed at [facility], [address] [place], [place] [address]    Report Status PENDING  Incomplete  Culture, blood (Routine X 2) w Reflex to ID Panel     Status: None (Preliminary result)   Collection Time: 08/23/22  7:19 AM   Specimen: Right Antecubital; Blood  Result Value Ref Range Status   Specimen Description   Final    RIGHT ANTECUBITAL BOTTLES DRAWN AEROBIC AND ANAEROBIC   Special Requests Blood Culture adequate volume  Final   Culture   Final    NO GROWTH 1 DAY Performed at [facility], [address], [place], [place] [address]    Report Status PENDING  Incomplete  Resp panel by RT-PCR (RSV, Flu A&B, Covid) Anterior Nasal Swab     Status: None   Collection Time: 08/23/22 10:03 AM   Specimen: Anterior Nasal Swab  Result Value Ref Range Status   SARS Coronavirus 2 by RT PCR NEGATIVE NEGATIVE Final    Comment: (NOTE) SARS-CoV-2 target nucleic acids are NOT DETECTED.  The SARS-CoV-2 RNA is generally detectable in upper respiratory specimens during the acute phase of  infection. The lowest concentration of SARS-CoV-2 viral copies this assay can detect is 138 copies/mL. A negative result does not preclude SARS-Cov-2 infection and should not be used as the sole basis for treatment or other patient management decisions. A negative result may occur with  improper specimen collection/handling, submission of specimen other than nasopharyngeal swab, presence of viral mutation(s) within the areas targeted by this assay, and inadequate number of viral copies(<138 copies/mL). A negative result must be combined with clinical observations, patient history, and epidemiological information. The expected result is Negative.  Fact Sheet for Patients:  EntrepreneurPulse.com.au  Fact Sheet for Healthcare Providers:  IncredibleEmployment.be  This test is no t yet approved or cleared by the [place] FDA and  has been authorized for detection and/or diagnosis of SARS-CoV-2 by FDA under an Emergency Use Authorization (EUA). This EUA will remain  in effect (meaning this test can be used) for the duration of the COVID-19 declaration under Section 564(b)(1) of the Act, 21 U.S.C.section 360bbb-3(b)(1), unless the authorization is terminated  or revoked sooner.       Influenza A by PCR NEGATIVE NEGATIVE Final   Influenza B by PCR NEGATIVE NEGATIVE Final    Comment: (NOTE) The Xpert Xpress SARS-CoV-2/FLU/RSV plus assay is intended as an aid in the diagnosis of influenza from Nasopharyngeal swab specimens and should not be used as a sole basis for treatment. Nasal washings and aspirates are unacceptable for Xpert Xpress SARS-CoV-2/FLU/RSV testing.  Fact Sheet for Patients: EntrepreneurPulse.com.au  Fact Sheet for Healthcare Providers: IncredibleEmployment.be  This test is not yet approved or cleared by the [place] FDA and has been authorized for detection and/or diagnosis of SARS-CoV-2  by FDA under an Emergency Use Authorization (EUA). This EUA  will remain in effect (meaning this test can be used) for the duration of the COVID-19 declaration under Section 564(b)(1) of the Act, 21 U.S.C. section 360bbb-3(b)(1), unless the authorization is terminated or revoked.     Resp Syncytial Virus by PCR NEGATIVE NEGATIVE Final    Comment: (NOTE) Fact Sheet for Patients: EntrepreneurPulse.com.au  Fact Sheet for Healthcare Providers: IncredibleEmployment.be  This test is not yet approved or cleared by the [place] FDA and has been authorized for detection and/or diagnosis of SARS-CoV-2 by FDA under an Emergency Use Authorization (EUA). This EUA will remain in effect (meaning this test can be used) for the duration of the COVID-19 declaration under Section 564(b)(1) of the Act, 21 U.S.C. section 360bbb-3(b)(1), unless the authorization is terminated or revoked.  Performed at [facility], [address] [place], [place] [address]   Respiratory (~20 pathogens) panel by PCR     Status: None   Collection Time: 08/23/22 10:03 AM   Specimen: Nasopharyngeal Swab; Respiratory  Result Value Ref Range Status   Adenovirus NOT DETECTED NOT DETECTED Final   Coronavirus 229E NOT DETECTED NOT DETECTED Final    Comment: (NOTE) The Coronavirus on the Respiratory Panel, DOES NOT test for the novel  Coronavirus (2019 nCoV)    Coronavirus HKU1 NOT DETECTED NOT DETECTED Final   Coronavirus NL63 NOT DETECTED NOT DETECTED Final   Coronavirus OC43 NOT DETECTED NOT DETECTED Final   Metapneumovirus NOT DETECTED NOT DETECTED Final   Rhinovirus / Enterovirus NOT DETECTED NOT DETECTED Final   Influenza A NOT DETECTED NOT DETECTED Final   Influenza B NOT DETECTED NOT DETECTED Final   Parainfluenza Virus 1 NOT DETECTED NOT DETECTED Final   Parainfluenza Virus 2 NOT DETECTED NOT DETECTED Final   Parainfluenza Virus 3 NOT DETECTED NOT DETECTED Final    Parainfluenza Virus 4 NOT DETECTED NOT DETECTED Final   Respiratory Syncytial Virus NOT DETECTED NOT DETECTED Final   Bordetella pertussis NOT DETECTED NOT DETECTED Final   Bordetella Parapertussis NOT DETECTED NOT DETECTED Final   Chlamydophila pneumoniae NOT DETECTED NOT DETECTED Final   Mycoplasma pneumoniae NOT DETECTED NOT DETECTED Final    Comment: Performed at [facility], [place]. [address], [place], [place] [address]     Labs: BNP (last 3 results) Recent Labs    05/28/22 1728 07/14/22 1809 08/22/22 1807  BNP 746.0* 456.0* 128.7*   Basic Metabolic Panel: Recent Labs  Lab 08/22/22 1805 08/23/22 0552 08/24/22 0443  NA 140 141 141  K 4.4 4.1 4.0  CL 102 104 103  CO2 '31 28 30  '$ GLUCOSE 94 78 85  BUN '20 17 15  '$ CREATININE 1.59* 1.38* 1.41*  CALCIUM 8.4* 7.9* 7.9*   Liver Function Tests: Recent Labs  Lab 08/22/22 1805 08/23/22 0552  AST 20 14*  ALT 9 8  ALKPHOS 53 49  BILITOT 0.4 0.4  PROT 7.0 5.7*  ALBUMIN 3.2* 2.6*   No results for input(s): "LIPASE", "AMYLASE" in the last 168 hours. No results for input(s): "AMMONIA" in the last 168 hours. CBC: Recent Labs  Lab 08/22/22 1805 08/23/22 0552 08/24/22 0443  WBC 4.9 4.6 4.3  NEUTROABS 2.5  --  2.1  HGB 8.2* 7.5* 7.8*  HCT 30.9* 27.5* 27.1*  MCV 98.7 96.8 93.1  PLT 231 205 223   Cardiac Enzymes: No results for input(s): "CKTOTAL", "CKMB", "CKMBINDEX", "TROPONINI" in the last 168 hours. BNP: Invalid input(s): "POCBNP" CBG: Recent Labs  Lab 08/23/22 0821 08/23/22 1151 08/23/22 1653 08/23/22 2139 08/24/22  0742  GLUCAP 86 88 112* 110* 91   D-Dimer No results for input(s): "DDIMER" in the last 72 hours. Hgb A1c Recent Labs    08/23/22 0719  HGBA1C 5.9*   Lipid Profile No results for input(s): "CHOL", "HDL", "LDLCALC", "TRIG", "CHOLHDL", "LDLDIRECT" in the last 72 hours. Thyroid function studies No results for input(s): "TSH", "T4TOTAL", "T3FREE", "THYROIDAB" in the last 72  hours.  Invalid input(s): "FREET3" Anemia work up No results for input(s): "VITAMINB12", "FOLATE", "FERRITIN", "TIBC", "IRON", "RETICCTPCT" in the last 72 hours. Urinalysis    Component Value Date/Time   COLORURINE YELLOW 05/28/2022 2235   APPEARANCEUR CLEAR 05/28/2022 2235   LABSPEC 1.014 05/28/2022 2235   PHURINE 6.0 05/28/2022 2235   GLUCOSEU NEGATIVE 05/28/2022 2235   HGBUR NEGATIVE 05/28/2022 2235   BILIRUBINUR NEGATIVE 05/28/2022 [place] 05/28/2022 2235   PROTEINUR NEGATIVE 05/28/2022 2235   NITRITE NEGATIVE 05/28/2022 2235   LEUKOCYTESUR NEGATIVE 05/28/2022 2235   Sepsis Labs Recent Labs  Lab 08/22/22 1805 08/23/22 0552 08/24/22 0443  WBC 4.9 4.6 4.3   Microbiology Recent Results (from the past 240 hour(s))  Culture, blood (Routine X 2) w Reflex to ID Panel     Status: None (Preliminary result)   Collection Time: 08/23/22  5:52 AM   Specimen: Left Antecubital; Blood  Result Value Ref Range Status   Specimen Description   Final    LEFT ANTECUBITAL BOTTLES DRAWN AEROBIC AND ANAEROBIC   Special Requests Blood Culture adequate volume  Final   Culture   Final    NO GROWTH 1 DAY Performed at [facility], [address], [place], [place] [address]    Report Status PENDING  Incomplete  Culture, blood (Routine X 2) w Reflex to ID Panel     Status: None (Preliminary result)   Collection Time: 08/23/22  7:19 AM   Specimen: Right Antecubital; Blood  Result Value Ref Range Status   Specimen Description   Final    RIGHT ANTECUBITAL BOTTLES DRAWN AEROBIC AND ANAEROBIC   Special Requests Blood Culture adequate volume  Final   Culture   Final    NO GROWTH 1 DAY Performed at [facility], [address] [place], [place] [address]    Report Status PENDING  Incomplete  Resp panel by RT-PCR (RSV, Flu A&B, Covid) Anterior Nasal Swab     Status: None   Collection Time: 08/23/22 10:03 AM   Specimen: Anterior Nasal Swab  Result Value Ref Range Status   SARS  Coronavirus 2 by RT PCR NEGATIVE NEGATIVE Final    Comment: (NOTE) SARS-CoV-2 target nucleic acids are NOT DETECTED.  The SARS-CoV-2 RNA is generally detectable in upper respiratory specimens during the acute phase of infection. The lowest concentration of SARS-CoV-2 viral copies this assay can detect is 138 copies/mL. A negative result does not preclude SARS-Cov-2 infection and should not be used as the sole basis for treatment or other patient management decisions. A negative result may occur with  improper specimen collection/handling, submission of specimen other than nasopharyngeal swab, presence of viral mutation(s) within the areas targeted by this assay, and inadequate number of viral copies(<138 copies/mL). A negative result must be combined with clinical observations, patient history, and epidemiological information. The expected result is Negative.  Fact Sheet for Patients:  EntrepreneurPulse.com.au  Fact Sheet for Healthcare Providers:  IncredibleEmployment.be  This test is no t yet approved or cleared by the [place] FDA and  has been authorized for detection and/or diagnosis of SARS-CoV-2  by FDA under an Emergency Use Authorization (EUA). This EUA will remain  in effect (meaning this test can be used) for the duration of the COVID-19 declaration under Section 564(b)(1) of the Act, 21 U.S.C.section 360bbb-3(b)(1), unless the authorization is terminated  or revoked sooner.       Influenza A by PCR NEGATIVE NEGATIVE Final   Influenza B by PCR NEGATIVE NEGATIVE Final    Comment: (NOTE) The Xpert Xpress SARS-CoV-2/FLU/RSV plus assay is intended as an aid in the diagnosis of influenza from Nasopharyngeal swab specimens and should not be used as a sole basis for treatment. Nasal washings and aspirates are unacceptable for Xpert Xpress SARS-CoV-2/FLU/RSV testing.  Fact Sheet for  Patients: EntrepreneurPulse.com.au  Fact Sheet for Healthcare Providers: IncredibleEmployment.be  This test is not yet approved or cleared by the [place] FDA and has been authorized for detection and/or diagnosis of SARS-CoV-2 by FDA under an Emergency Use Authorization (EUA). This EUA will remain in effect (meaning this test can be used) for the duration of the COVID-19 declaration under Section 564(b)(1) of the Act, 21 U.S.C. section 360bbb-3(b)(1), unless the authorization is terminated or revoked.     Resp Syncytial Virus by PCR NEGATIVE NEGATIVE Final    Comment: (NOTE) Fact Sheet for Patients: EntrepreneurPulse.com.au  Fact Sheet for Healthcare Providers: IncredibleEmployment.be  This test is not yet approved or cleared by the [place] FDA and has been authorized for detection and/or diagnosis of SARS-CoV-2 by FDA under an Emergency Use Authorization (EUA). This EUA will remain in effect (meaning this test can be used) for the duration of the COVID-19 declaration under Section 564(b)(1) of the Act, 21 U.S.C. section 360bbb-3(b)(1), unless the authorization is terminated or revoked.  Performed at [facility], [address], [place], [place] [address]   Respiratory (~20 pathogens) panel by PCR     Status: None   Collection Time: 08/23/22 10:03 AM   Specimen: Nasopharyngeal Swab; Respiratory  Result Value Ref Range Status   Adenovirus NOT DETECTED NOT DETECTED Final   Coronavirus 229E NOT DETECTED NOT DETECTED Final    Comment: (NOTE) The Coronavirus on the Respiratory Panel, DOES NOT test for the novel  Coronavirus (2019 nCoV)    Coronavirus HKU1 NOT DETECTED NOT DETECTED Final   Coronavirus NL63 NOT DETECTED NOT DETECTED Final   Coronavirus OC43 NOT DETECTED NOT DETECTED Final   Metapneumovirus NOT DETECTED NOT DETECTED Final   Rhinovirus / Enterovirus NOT DETECTED NOT DETECTED Final    Influenza A NOT DETECTED NOT DETECTED Final   Influenza B NOT DETECTED NOT DETECTED Final   Parainfluenza Virus 1 NOT DETECTED NOT DETECTED Final   Parainfluenza Virus 2 NOT DETECTED NOT DETECTED Final   Parainfluenza Virus 3 NOT DETECTED NOT DETECTED Final   Parainfluenza Virus 4 NOT DETECTED NOT DETECTED Final   Respiratory Syncytial Virus NOT DETECTED NOT DETECTED Final   Bordetella pertussis NOT DETECTED NOT DETECTED Final   Bordetella Parapertussis NOT DETECTED NOT DETECTED Final   Chlamydophila pneumoniae NOT DETECTED NOT DETECTED Final   Mycoplasma pneumoniae NOT DETECTED NOT DETECTED Final    Comment: Performed at [facility], [place]. [address], [place], [place] [address]     Time coordinating discharge: Over 30 minutes  SIGNED:   Darliss Cheney, MD  [facility] 08/24/2022, 8:50 AM *Please note that this is a verbal dictation therefore any spelling or grammatical errors are due to the "[place]" system interpretation. If 7PM-7AM, please contact night-coverage [email or web address]

## 2022-08-24 NOTE — Plan of Care (Signed)
  Problem: Acute Rehab OT Goals (only OT should resolve) Goal: Pt. Will Perform Grooming Flowsheets (Taken 08/24/2022 0947) Pt Will Perform Grooming:  with modified independence  standing Goal: Pt. Will Perform Upper Body Dressing Flowsheets (Taken 08/24/2022 0947) Pt Will Perform Upper Body Dressing:  with modified independence  sitting Goal: Pt. Will Perform Lower Body Dressing Flowsheets (Taken 08/24/2022 0947) Pt Will Perform Lower Body Dressing:  with modified independence  sitting/lateral leans Goal: Pt. Will Transfer To Toilet [place] (Taken 08/24/2022 [id number]) Pt Will Transfer to Toilet:  with modified independence  ambulating Goal: Pt/Caregiver Will Perform Home Exercise Program Flowsheets (Taken 08/24/2022 0947) Pt/caregiver will Perform Home Exercise Program:  Increased ROM  Increased strength  Independently  Both right and left upper extremity  Kili Gracy OT, MOT

## 2022-08-25 LAB — LEGIONELLA PNEUMOPHILA SEROGP 1 UR AG: L. pneumophila Serogp 1 Ur Ag: NEGATIVE

## 2022-08-26 ENCOUNTER — Ambulatory Visit: Payer: Medicare HMO | Admitting: Nurse Practitioner

## 2022-08-27 ENCOUNTER — Encounter (HOSPITAL_COMMUNITY): Payer: Medicare HMO | Admitting: Cardiology

## 2022-08-28 LAB — CULTURE, BLOOD (ROUTINE X 2)
Culture: NO GROWTH
Culture: NO GROWTH
Special Requests: ADEQUATE
Special Requests: ADEQUATE

## 2022-08-31 ENCOUNTER — Ambulatory Visit: Payer: Medicare HMO | Admitting: Nurse Practitioner

## 2022-08-31 NOTE — Progress Notes (Deleted)
Cardiology Office Note:    Date:  08/31/2022   [name], DOB 1941/08/07, MRN [id number]  PCP:  Gregory Burly, MD   [employer] Providers Cardiologist:  Carlyle Dolly, MD     Referring MD: Gregory Burly, MD   CC: Here for follow-up  History of Present Illness:    Gregory Mitchell is a 82 y.o. male with a hx of the following:  Chronic systolic CHF, leg edema Hypertension Hyperlipidemia History of PE History of chest pain COPD, wears 3 L of O2 at night Former smoker Hx of epigastric pain and melena   Patient is a 82 year old male with past medical history as mentioned above.  Previous cardiovascular history includes reduced ejection fraction with improvement in 2014.  History of cardiac catheterization in 2013 that showed nonobstructive CAD.  Echocardiogram in 2017 showed EF 55 to 60%, grade 1 DD.  Was admitted in 2021 with volume overload.  Echocardiogram at that time revealed EF reduced to 25 to 30%, grade 2 DD, mild MR and normal RV.  Cardiac catheterization revealed normal coronary arteries, mean PA 17, PCWP 17, LVEDP 13, and CI 3.  Diagnosed with bilateral PEs during [facility] admission in 2020, appears unprovoked.  No prior history of blood clots.  Echocardiogram in 2022 showed EF 20 to 25%, grade 3 DD, normal RV.  Has been followed in CHF clinic though has difficulty with transportation.  Echo 02/2021 showed EF 30 to 35%, mild RV dysfunction.  CHF clinic recommended cardiac MRI.  Last seen by Dr. Carlyle Dolly on March 23, 2022.  He was taking torsemide as needed.  Due to his ongoing edema, was advised to take torsemide for the next 3 days and then to resume as needed.  Entresto was changed to valsartan.  Because his blood pressures were soft, he was not on Aldactone at the time.  Had a long history of atypical symptoms of chest pain.  Described as a tightness in the mid chest, can occur at rest or with activity, 10 out of 10 in severity, not  positional, worse with eating, better with fluid pill and with nebulizer, was unable to determine how long it lasted in duration.  He was pending GI endoscopic, and Dr. Harl Bowie stated was okay for clearance from a cardiac standpoint.  Was told to follow-up in 4 months.  Was admitted in December 2023 with abdominal pain and concern for GI bleed with melena.  EGD on July 16, 2022 with findings of Schatzki's ring s/p dilation, intact dundoplication with protruding sutures, otherwise normal-appearing stomach.  Was placed on clear liquid diet with plan to advance to advance diet as tolerated and discharge home.  He left AMA on July 17, 2022.  Our office was contacted in December, and Dr. Harl Bowie spoke with pulmonologist who felt that it was best to go from [facility] back to [facility].    I saw him for follow-up with his daughter on 08/19/2022.  Stated he was not doing well. CC was weakness, shortness of breath, feet/ankle swelling, and ongoing chest pain.  Described chest pain as in the right middle side of his chest, moved from right side to middle chest, described as tightness, was ongoing for months.  Made better when laying down, worse with eating or drinking.  Similar type of chest pain when he saw Dr. Carlyle Dolly in August last year.  He did undergo endoscopy and had esophageal dilatation performed.  Daughter presents concern that patient's systolic  blood pressure at home is low (< 100), rechecks this to make sure. Pt was staying well hydrated.  Patient denied any palpitations, orthopnea, PND, significant weight changes, acute bleeding, or claudication.  Because of diminished scattered crackles noted to auscultation in office that day, I was concerned patient was showing signs and symptoms of pneumonia and recommended ED evaluation.  Patient declined, and I discussed risk of not being seen in the ED, and he verbalized understanding.  Patient stated he would rather be managed outpatient. Blood work was ordered  including CMET and proBNP with plan to uptitrate torsemide if kidney function was stable and BNP was elevated.  Case was discussed with Dr. Harl Bowie who recommended continuing valsartan instead of starting Entresto.  2 view chest x-ray was ordered to rule out acute abnormality/pneumonia.  Was told to follow-up in 1 week.  Presented to [name] ED on January 14 and was instructed to head to ED based on recent chest x-ray and lab results.  Troponins x 2 were flat.  BNP 564.  BP on arrival 180/54.  Creatinine 1.59 baseline creatinine at 1.0-1.2.  CXR showed chronic patchy consolidation, reticulation and bronchiectasis at left greater than right lung base with slightly worse than left lung base consolidation, suggesting worsening of chronic/recurrent infection such as due to recurrent aspiration.  Treated with IV antibiotics.  Tested negative for COVID, flu, RSV and respiratory panel.  Was discharged on Augmentin and 5 days of prednisone for COPD exacerbation.  A few days later after discharge, presented to [facility] for COPD exacerbation.  12-lead EKG showed sinus rhythm with first-degree AV block, RBBB, T wave abnormality.  CXR showed progressive left basilar airspace infiltrate in keeping with acute infection/aspiration.  He was prescribed doxycycline, prednisone, Lasix, and Zofran.  Was discharged home to self-care.  Today he presents for follow-up.  He states.   Past Medical History:  Diagnosis Date   Asthma    Chronic renal insufficiency    COPD (chronic obstructive pulmonary disease) (HCC)    Heart failure ([place])    a. EF 25-30% in 08/2019 with cath showing normal cors b. EF 20-25% in 09/2020 c. 30-35% in 02/2021 d. EF 20-25% in 10/2021   Hypertension    Pulmonary emboli ([place]) 03/2019   Renal insufficiency    Type 2 diabetes mellitus ([place])     Past Surgical History:  Procedure Laterality Date   APPENDECTOMY     BALLOON DILATION N/A 03/18/2020   Procedure: BALLOON DILATION;  Surgeon:  Eloise Harman, DO;  [facility];  Service: Endoscopy;  Laterality: N/A;   CHOLECYSTECTOMY     COLONOSCOPY  10/2006   Dr.Anwar:normal   ESOPHAGEAL BRUSHING  08/24/2021   Procedure: ESOPHAGEAL BRUSHING;  Surgeon: Eloise Harman, DO;  Location[facility];  Service: Endoscopy;;   ESOPHAGOGASTRODUODENOSCOPY (EGD) WITH PROPOFOL N/A 03/18/2020   Procedure: ESOPHAGOGASTRODUODENOSCOPY (EGD) WITH PROPOFOL;  Surgeon: Eloise Harman, DO;  [facility];  Service: Endoscopy;  Laterality: N/A;  2:30pm   ESOPHAGOGASTRODUODENOSCOPY (EGD) WITH PROPOFOL N/A 08/24/2021   Procedure: ESOPHAGOGASTRODUODENOSCOPY (EGD) WITH PROPOFOL;  Surgeon: Eloise Harman, DO;  [facility];  Service: Endoscopy;  Laterality: N/A;  with dilation   ESOPHAGOGASTRODUODENOSCOPY (EGD) WITH PROPOFOL N/A 07/16/2022   Procedure: ESOPHAGOGASTRODUODENOSCOPY (EGD) WITH PROPOFOL;  Surgeon: Daneil Dolin, MD;  [facility];  Service: Endoscopy;  Laterality: N/A;   FLEXIBLE SIGMOIDOSCOPY N/A 08/24/2021   Procedure: FLEXIBLE SIGMOIDOSCOPY;  Surgeon: Eloise Harman, DO;  [facility];  Service: Endoscopy;  Laterality: N/A;   MALONEY DILATION  07/16/2022   Procedure: Venia Minks DILATION;  Surgeon: Daneil Dolin, MD;  [facility];  Service: Endoscopy;;   NASAL SINUS SURGERY     NEPHRECTOMY     RIGHT (Question CA.  No further therapy needed.)   RIGHT/LEFT HEART CATH AND CORONARY ANGIOGRAPHY N/A 09/03/2019   Procedure: RIGHT/LEFT HEART CATH AND CORONARY ANGIOGRAPHY;  Surgeon: Lorretta Harp, MD;  Location: [facility];  Service: Cardiovascular;  Laterality: N/A;    Current Medications: No outpatient medications have been marked as taking for the 08/31/22 encounter (Appointment) with Finis Bud, NP.     Allergies:   Patient has no known allergies.   Social History   Socioeconomic History   Marital status: Widowed    Spouse name: Not on file    Number of children: Not on file   Years of education: Not on file   Highest education level: Not on file  Occupational History   Not on file  Tobacco Use   Smoking status: Former    Packs/day: 1.00    Years: 15.00    Total pack years: 15.00    Types: Cigarettes    Start date: 07/14/1957    Quit date: 08/09/1976    Years since quitting: 46.0   Smokeless tobacco: Former    Types: Chew    Quit date: 02/10/2006   Tobacco comments:    chewed tobacco for 10 years  Vaping Use   Vaping Use: Never used  Substance and Sexual Activity   Alcohol use: Not Currently    Alcohol/week: 0.0 standard drinks of alcohol    Comment: no etoh since he was in his 5s   Drug use: Never   Sexual activity: Yes  Other Topics Concern   Not on file  Social History Narrative   Lives with wife.  Three children.     Social Determinants of Health   Financial Resource Strain: Not on file  Food Insecurity: No Food Insecurity (08/23/2022)   Hunger Vital Sign    Worried About Running Out of Food in the Last Year: Never true    Ran Out of Food in the Last Year: Never true  Transportation Needs: No Transportation Needs (08/23/2022)   PRAPARE - Hydrologist (Medical): No    Lack of Transportation (Non-Medical): No  Physical Activity: Not on file  Stress: Not on file  Social Connections: Not on file     Family History: The patient's family history includes Breast cancer in his mother; Lung cancer in his brother; Stroke in his mother. There is no history of Colon cancer.  ROS:   Review of Systems  Constitutional:  Positive for malaise/fatigue. Negative for chills, diaphoresis, fever and weight loss.       See HPI.   HENT: Negative.    Eyes: Negative.   Respiratory:  Positive for shortness of breath. Negative for cough, hemoptysis, sputum production and wheezing.        See HPI.   Cardiovascular:  Positive for chest pain and leg swelling. Negative for palpitations, orthopnea,  claudication and PND.       See HPI.   Gastrointestinal: Negative.   Genitourinary: Negative.   Musculoskeletal: Negative.   Skin: Negative.   Neurological:  Positive for weakness. Negative for dizziness, tingling, tremors, sensory change, speech change, focal weakness, seizures, loss of consciousness and headaches.  Endo/Heme/Allergies: Negative.  Psychiatric/Behavioral: Negative.      Please see the history of present illness.    All other systems reviewed and are negative.  EKGs/Labs/Other Studies Reviewed:    The following studies were reviewed today:   EKG:  EKG is not ordered today.    Echocardiogram on July 15, 2022:  1. Left ventricular ejection fraction, by estimation, is 20 to 25%. The  left ventricle has severely decreased function. The left ventricle  demonstrates global hypokinesis. The left ventricular internal cavity size  was moderately dilated. There is mild  left ventricular hypertrophy. Left ventricular diastolic parameters are  indeterminate.   2. Right ventricular systolic function is normal. The right ventricular  size is normal. There is normal pulmonary artery systolic pressure.   3. Left atrial size was severely dilated.   4. Right atrial size was severely dilated.   5. The mitral valve is normal in structure. No evidence of mitral valve  regurgitation. No evidence of mitral stenosis.   6. The tricuspid valve is abnormal.   7. The aortic valve is tricuspid. Aortic valve regurgitation is not  visualized. No aortic stenosis is present.   8. The inferior vena cava is normal in size with greater than 50%  respiratory variability, suggesting right atrial pressure of 3 mmHg.  Right left heart cath on September 03, 2019: Mr. Etheridge has normal coronary arteries and low filling pressures suggesting that he has been adequately or over diuresed. He did have a high V waves suggesting that he potentially has significant mitral regurgitation. His systolic  pressures were in the mid to high 90s and I therefore gave him a 250 cc bolus of saline given his low LVEDP and wedge. He will need guideline directed optimal medical therapy for his LV dysfunction. The sheath removed and a TR band was placed on the right wrist to achieve patent hemostasis. The patient left lab in stable condition. His renal function be carefully monitored. Dr. Davina Poke was notified of these results.  Myoview on Dec 11, 2014: IMPRESSION: 1. Moderate size region of inferior wall myocardial scar. No ischemic territories seen. Overlying soft tissue attenuation artifact cannot entirely be ruled out.   2. Mild inferior wall hypokinesis.   3. Left ventricular ejection fraction 48%   4.  Low to intermediate-risk stress test findings*.  Recent Labs: 07/15/2022: Magnesium 2.2 08/22/2022: B Natriuretic Peptide 564.0 08/23/2022: ALT 8 08/24/2022: BUN 15; Creatinine, Ser 1.41; Hemoglobin 7.8; Platelets 223; Potassium 4.0; Sodium 141  Recent Lipid Panel No results found for: "CHOL", "TRIG", "HDL", "CHOLHDL", "VLDL", "LDLCALC", "LDLDIRECT"   Physical Exam:    VS:  There were no vitals taken for this visit.    Wt Readings from Last 3 Encounters:  08/24/22 136 lb 7.4 oz (61.9 kg)  08/19/22 140 lb 9.6 oz (63.8 kg)  07/16/22 138 lb 7.2 oz (62.8 kg)     GEN: Thin, 82 y.o. male, appears lethargic, ill-appearing HEENT: Normal NECK: No JVD; No carotid bruits CARDIAC: S1/S2, RRR, no murmurs, rubs, gallops; 2+ pulses RESPIRATORY:  Diminished scattered crackles to auscultation without wheezing or rhonchi  MUSCULOSKELETAL:  No leg edema, does have nonpitting edema along bilateral ankles and feet; No deformity  SKIN: Warm and dry NEUROLOGIC:  Alert and oriented x 3 PSYCHIATRIC:  Flat affect   ASSESSMENT:    No diagnosis found.  PLAN:    In order of problems listed above:  Chronic systolic CHF, medication management Echocardiogram in 07/2022 revealed EF 20-25%, unchanged from  10/2021. Showing signs  today of ADHF. (See PE). Recommended inpatient management, however patient declined. Discussed risks of outpatient management and he verbalized understanding. Will obtain labwork including: CMET and pro-BNP. Plan to up-titrate Torsemide if kidney function stable and BNP elevated. Continue Farxiga, Toprol XL, Torsemide, and Valsartan. Case d/w Dr. Carlyle Dolly who recommended with patient's current blood pressure to continue valsartan instead of Entresto. ED precautions discussed. Low sodium diet, fluid restriction <2L, and daily weights encouraged. Educated to contact our office for weight gain of 2 lbs overnight or 5 lbs in one week. Discussed and recommended for patient to be seen at HF clinic for first available appointment, has previously been established.   Chest pain Similar presentation at last OV. Chronic, stable for several months. Showing signs of volume overload, therefore would need to optimize volume before pursuing considering ischemic evaluation. No current chest pain now. Cath 2021 showed normal coronary arteries. Dr. Harl Bowie previously stated that symptoms in past improve with diuretic. If labs are WNL, plan to increase usage. Wondering if COPD is contributing to this. ED precautions discussed.   Shortness of breath/Crackles, lower leg swelling New since last OV. Diminished, scattered crackles noted on exam. Will obtain 2 view CXR to r/o anything acute, concerned this may be evidence of volume overload/PNA. Has had ongoing hx of swelling. Will obtain following labs: CBC with differential, CMET, and pro-BNP. If labs WNL, plan to increase Torsemide usage and will switch to Torsemide daily thereafter. ED precautions discussed. Continue to follow with PCP.  HTN BP today 126/60. SBP < 100 occasionally per daughter's report. No change in medications at this time. Discussed to monitor BP at home at least 2 hours after medications and sitting for 5-10 minutes. Will obtain  labs. Continue to monitor and log BP at home. Heart healthy diet and regular cardiovascular exercise as tolerated encouraged.   5. Disposition: Follow up with me in 1 week or sooner if anything changes. As stated earlier (point #1),  I recommended inpatient management based on patient's signs and symptoms and PE findings today, however patient declined. Discussed risks of outpatient management and he verbalized understanding.   I spent 45 minutes with the patient and discussed this case with patient's cardiologist, Dr. Harl Bowie who agreed with above plan.   Medication Adjustments/Labs and Tests Ordered: Current medicines are reviewed at length with the patient today.  Concerns regarding medicines are outlined above.  No orders of the defined types were placed in this encounter.  No orders of the defined types were placed in this encounter.   There are no Patient Instructions on file for this visit.   Signed, Finis Bud, NP  08/31/2022 11:11 AM    [place]

## 2022-09-02 ENCOUNTER — Ambulatory Visit: Payer: Medicare HMO | Admitting: Internal Medicine

## 2022-09-07 ENCOUNTER — Encounter: Payer: Self-pay | Admitting: Nurse Practitioner

## 2022-09-08 ENCOUNTER — Ambulatory Visit: Payer: Medicare HMO | Admitting: Internal Medicine

## 2022-09-13 ENCOUNTER — Ambulatory Visit (HOSPITAL_COMMUNITY)
Admission: RE | Admit: 2022-09-13 | Discharge: 2022-09-13 | Disposition: A | Discharge status: Home / Self Care | Payer: Medicare HMO | Source: Ambulatory Visit | Attending: Cardiology | Admitting: Cardiology

## 2022-09-13 ENCOUNTER — Encounter (HOSPITAL_COMMUNITY): Payer: Self-pay | Admitting: Cardiology

## 2022-09-13 VITALS — Wt 146.2 lb

## 2022-09-13 DIAGNOSIS — Z7901 Long term (current) use of anticoagulants: Secondary | ICD-10-CM | POA: Diagnosis not present

## 2022-09-13 DIAGNOSIS — I5022 Chronic systolic (congestive) heart failure: Secondary | ICD-10-CM

## 2022-09-13 DIAGNOSIS — I130 Hypertensive heart and chronic kidney disease with heart failure and stage 1 through stage 4 chronic kidney disease, or unspecified chronic kidney disease: Secondary | ICD-10-CM | POA: Insufficient documentation

## 2022-09-13 DIAGNOSIS — Z79899 Other long term (current) drug therapy: Secondary | ICD-10-CM | POA: Diagnosis not present

## 2022-09-13 DIAGNOSIS — I959 Hypotension, unspecified: Secondary | ICD-10-CM | POA: Insufficient documentation

## 2022-09-13 DIAGNOSIS — N183 Chronic kidney disease, stage 3 unspecified: Secondary | ICD-10-CM | POA: Insufficient documentation

## 2022-09-13 DIAGNOSIS — E1122 Type 2 diabetes mellitus with diabetic chronic kidney disease: Secondary | ICD-10-CM | POA: Insufficient documentation

## 2022-09-13 DIAGNOSIS — I5042 Chronic combined systolic (congestive) and diastolic (congestive) heart failure: Secondary | ICD-10-CM | POA: Diagnosis not present

## 2022-09-13 DIAGNOSIS — Z9981 Dependence on supplemental oxygen: Secondary | ICD-10-CM | POA: Diagnosis not present

## 2022-09-13 DIAGNOSIS — J449 Chronic obstructive pulmonary disease, unspecified: Secondary | ICD-10-CM | POA: Diagnosis not present

## 2022-09-13 DIAGNOSIS — Z86711 Personal history of pulmonary embolism: Secondary | ICD-10-CM | POA: Insufficient documentation

## 2022-09-13 DIAGNOSIS — I428 Other cardiomyopathies: Secondary | ICD-10-CM | POA: Diagnosis not present

## 2022-09-13 LAB — BASIC METABOLIC PANEL
Anion gap: 7 (ref 5–15)
BUN: 17 mg/dL (ref 8–23)
CO2: 30 mmol/L (ref 22–32)
Calcium: 7.8 mg/dL — ABNORMAL LOW (ref 8.9–10.3)
Chloride: 98 mmol/L (ref 98–111)
Creatinine, Ser: 1.58 mg/dL — ABNORMAL HIGH (ref 0.61–1.24)
GFR, Estimated: 43 mL/min — ABNORMAL LOW (ref >60)
Glucose, Bld: 118 mg/dL — ABNORMAL HIGH (ref 70–99)
Potassium: 4.4 mmol/L (ref 3.5–5.1)
Sodium: 135 mmol/L (ref 135–145)

## 2022-09-13 LAB — BRAIN NATRIURETIC PEPTIDE: B Natriuretic Peptide: 209.1 pg/mL — ABNORMAL HIGH (ref 0.0–100.0)

## 2022-09-13 MED ORDER — SPIRONOLACTONE 25 MG PO TABS: 12.5000 mg | ORAL_TABLET | Freq: Every day | ORAL | 3 refills | Status: DC

## 2022-09-13 MED ORDER — TORSEMIDE 20 MG PO TABS: 20.0000 mg | ORAL_TABLET | ORAL | 3 refills | Status: DC

## 2022-09-13 NOTE — Patient Instructions (Signed)
START Spironolactone 12.5 mg (1/2 Tab) daily.  CHANGE Torsemide to 20 mg daily for 4 days, then take 20 mg every other day.  Labs done today, your results will be available in MyChart, we will contact you for abnormal readings.  Repeat blood work in 10 days at [facility].  Your physician recommends that you schedule a follow-up appointment in: 3 weeks  If you have any questions or concerns before your next appointment please send [place] a message through [place] or call our office at [phone].    TO LEAVE A MESSAGE FOR THE NURSE SELECT OPTION 2, PLEASE LEAVE A MESSAGE INCLUDING: YOUR NAME DATE OF BIRTH CALL BACK NUMBER REASON FOR CALL**this is important as we prioritize the call backs  YOU WILL RECEIVE A CALL BACK THE SAME DAY AS LONG AS YOU CALL BEFORE 4:00 PM  At the [facility], you and your health needs are our priority. As part of our continuing mission to provide you with exceptional heart care, we have created designated Provider Care Teams. These Care Teams include your primary Cardiologist (physician) and Advanced Practice Providers (APPs- Physician Assistants and Nurse Practitioners) who all work together to provide you with the care you need, when you need it.   You may see any of the following providers on your designated Care Team at your next follow up: Dr Glori Bickers Dr Loralie Champagne Dr. Roxana Hires, NP Lyda Jester, [place] [facility] [place], [place] [name], NP Audry Riles, PharmD   Please be sure to bring in all your medications bottles to every appointment.    Thank you for choosing [facility]

## 2022-09-13 NOTE — Progress Notes (Signed)
PCP: Neale Burly, MD Cardiology: Dr Harl Bowie HF Cardiology: Dr. Aundra Dubin  82 y.o. with history of COPD, CKD stage 3, HTN, COVID 09/2020, and chronic systolic CHF  Patient was first found to have CHF in 1/21.  He was admitted at that time with volume overload and dyspnea.  Cath showed no significant coronary disease.  Echo showed EF 25-30%.  Most recent prior to 2017 showed normal EF.  He was admitted in [place] in 8/21 with CHF and PNA.  He was admitted at [facility] in 12/21 with COVID-19 and CHF. He also has emphysema and uses home oxygen. He no longer smokes.  He had a PE in 2021 and is on apixaban.   Admitted the end of February 2022 and again in March 2022 due to COVID pneumonia.   Echo in 12/23 showed EF 20-25%, moderate LV dilation, mild LVH, normal RV.   He was admitted in 1/24 at [facility] with COPD exacerbation and ?PNA.  He was treated with Augmentin.   With low BP, he was taken off Entresto and started on valsartan.   Today he returns for HF follow up. Weight has been trending up.  He is short of breath with stairs and with walking longer distances across his house.  Dyspnea has been worse the last few weeks and was not improved much with treatment of COPD at recent [facility] admission.  He has been sleeping propped up on a wedge for about a month.  He gets chest tightness with eating and drinking, not with exertion.  Occasional mild lightheadedness with standing. He is using torsemide only prn (1-2 times/week).   ECG (1/24, personally reviewed): NSR with 1st degree AVB, RBBB  Labs (12/21): K 4.7, creatinine 1.4 Labs (08/13/2020): K 4.2 Creatinine 1.2 Labs (09/01/20): K 4.6 Creatinine 1.4  Labs (10/16/20): K 5.1 Creatinine 1.4  Labs (1/24): K 3.9, creatinine 1.46  PMH: 1. HTN 2. Hyperlipidemia 3. PE: 2021 4. COPD: Emphysema.  He is on oxygen at night.  Quit tobacco 40+ years ago.  5. Type 2 diabetes 6. CKD stage 3 7. GERD with esophagitis 8. COVID-19 infection 9.  Chronic systolic CHF: Nonischemic cardiomyopathy.  - Echo (2017): EF 55-60% - LHC (1/21): Normal coronaries.  - Echo (1/21): EF 25-30%, mild MR, normal RV - Echo (12/23): EF 20-25%, moderate LV dilation, mild LVH, normal RV.   SH: Lives in [place].  Retired.  Former smoker.  Married.    Family History  Problem Relation Age of Onset   Stroke Mother    Breast cancer Mother    Lung cancer Brother    Colon cancer Neg Hx    ROS: All systems reviewed and negative except as per HPI.   Current Outpatient Medications  Medication Sig Dispense Refill   albuterol (PROVENTIL) (2.5 MG/3ML) 0.083% nebulizer solution Take 3 mLs (2.5 mg total) by nebulization every 4 (four) hours as needed for wheezing or shortness of breath. 75 mL 2   albuterol (VENTOLIN HFA) 108 (90 Base) MCG/ACT inhaler Inhale 1-2 puffs into the lungs every 4 (four) hours as needed for shortness of breath.     atorvastatin (LIPITOR) 40 MG tablet Take 1 tablet by mouth daily.     budesonide (PULMICORT) 0.25 MG/2ML nebulizer solution Take 2 mLs (0.25 mg total) by nebulization 2 (two) times daily. 60 mL 12   dapagliflozin propanediol (FARXIGA) 10 MG TABS tablet Take 1 tablet (10 mg total) by mouth daily before breakfast. 100 tablet 3  guaiFENesin (MUCINEX) 600 MG 12 hr tablet Take 1 tablet (600 mg total) by mouth 2 (two) times daily. 60 tablet 2   HM SENNA-S 8.6-50 MG tablet Take 2 tablets by mouth daily.     HYDROcodone-acetaminophen (NORCO) 7.5-325 MG tablet Take 1 tablet by mouth 2 (two) times daily as needed for moderate pain.     ipratropium-albuterol (DUONEB) 0.5-2.5 (3) MG/3ML SOLN Take 3 mLs by nebulization every 6 (six) hours as needed (shortness of breath).     metoprolol succinate (TOPROL XL) 25 MG 24 hr tablet Take 1 tablet (25 mg total) by mouth daily. 90 tablet 3   mirtazapine (REMERON) 15 MG tablet Take 15 mg by mouth daily.     OXYGEN Inhale 3 L into the lungs daily as needed (for shortness of breath).      pantoprazole (PROTONIX) 40 MG tablet Take 40 mg by mouth 2 (two) times daily.     spironolactone (ALDACTONE) 25 MG tablet Take 0.5 tablets (12.5 mg total) by mouth daily. 45 tablet 3   tamsulosin (FLOMAX) 0.4 MG CAPS capsule Take 0.8 mg by mouth 2 (two) times daily.     valsartan (DIOVAN) 80 MG tablet Take 80 mg by mouth daily.     torsemide (DEMADEX) 20 MG tablet Take 1 tablet (20 mg total) by mouth every other day. 45 tablet 3   No current facility-administered medications for this encounter.   Wt 66.3 kg (146 lb 3.2 oz)   BMI 23.60 kg/m   There were no vitals filed for this visit.  Wt Readings from Last 3 Encounters:  09/13/22 66.3 kg (146 lb 3.2 oz)  08/24/22 61.9 kg (136 lb 7.4 oz)  08/19/22 63.8 kg (140 lb 9.6 oz)   General: NAD Neck: JVP 8-9 cm with HJR, no thyromegaly or thyroid nodule.  Lungs: Crackles at bases bilaterally. CV: Nondisplaced PMI.  Heart regular S1/S2, no S3/S4, no murmur.  No peripheral edema.  No carotid bruit.  Normal pedal pulses.  Abdomen: Soft, nontender, no hepatosplenomegaly, no distention.  Skin: Intact without lesions or rashes.  Neurologic: Alert and oriented x 3.  Psych: Normal affect. Extremities: No clubbing or cyanosis.  HEENT: Normal.   Assessment/Plan: 1. Chronic systolic CHF: Nonischemic cardiomyopathy.  LHC in 1/21 with no significant coronary disease.  Echo (1/21) with EF 25-30%, mild MR, normal RV. Echo in 12/23 showed EF 20-25%, moderate LV dilation, mild LVH, normal RV.  Cause of cardiomyopathy is uncertain. NYHA class III symptoms.  He is volume overloaded with worsening symptoms x several weeks (including orthopnea).  - Torsemide 20 mg daily x 4 days then 20 mg every other day after that.  BMET/BNP today and BMET in 10 days.  - Toprol XL 25 mg daily.  - Continue valsartan 80 mg daily.  He was taken off Entresto due to low BP.  - Continue Farxiga 10 mg daily with CHF and CKD.  - Start spironolactone 12.5 mg daily.  2. History of PE:  He remains on Eliquis.  3. CKD: Stage 3, BMET today.  4. COPD: On home oxygen.   Followup in 3 wks with APP.   Loralie Champagne 09/13/2022

## 2022-09-20 ENCOUNTER — Encounter: Payer: Self-pay | Admitting: Nurse Practitioner

## 2022-09-20 ENCOUNTER — Ambulatory Visit: Payer: Medicare HMO | Attending: Nurse Practitioner | Admitting: Nurse Practitioner

## 2022-09-20 VITALS — BP 80/55 | HR 97 | Ht 66.0 in | Wt 135.0 lb

## 2022-09-20 DIAGNOSIS — I5022 Chronic systolic (congestive) heart failure: Secondary | ICD-10-CM | POA: Diagnosis not present

## 2022-09-20 DIAGNOSIS — R0602 Shortness of breath: Secondary | ICD-10-CM

## 2022-09-20 DIAGNOSIS — R0989 Other specified symptoms and signs involving the circulatory and respiratory systems: Secondary | ICD-10-CM

## 2022-09-20 DIAGNOSIS — J449 Chronic obstructive pulmonary disease, unspecified: Secondary | ICD-10-CM

## 2022-09-20 DIAGNOSIS — R079 Chest pain, unspecified: Secondary | ICD-10-CM | POA: Diagnosis not present

## 2022-09-20 DIAGNOSIS — J690 Pneumonitis due to inhalation of food and vomit: Secondary | ICD-10-CM

## 2022-09-20 DIAGNOSIS — R1310 Dysphagia, unspecified: Secondary | ICD-10-CM

## 2022-09-20 DIAGNOSIS — I10 Essential (primary) hypertension: Secondary | ICD-10-CM

## 2022-09-20 MED ORDER — MIDODRINE HCL 2.5 MG PO TABS: 2.5000 mg | ORAL_TABLET | Freq: Three times a day (TID) | ORAL | 1 refills | Status: DC | PRN

## 2022-09-20 NOTE — Patient Instructions (Addendum)
Medication Instructions:  Begin Midodrine 2.57m three times per day as needed for BP less than 90/60 Continue all other medications.     Labwork: none  Testing/Procedures: Chest x-ray - order given today   Follow-Up: Office will contact with results via phone, letter or mychart.    1 week    Any Other Special Instructions Will Be Listed Below (If Applicable). You have been referred to Pulmonology   You have been referred to GI   If you need a refill on your cardiac medications before your next appointment, please call your pharmacy.

## 2022-09-20 NOTE — Progress Notes (Unsigned)
Cardiology Office Note:    Date:  09/20/2022  [name], DOB 22-Sep-1940, MRN [id number]  PCP:  Gregory Burly, MD   [place] Providers Cardiologist:  Gregory Dolly, MD     Referring MD: Gregory Burly, MD   CC: Here for follow-up  History of Present Illness:    Gregory Mitchell is a 82 y.o. male with a hx of the following:  Chronic systolic CHF, leg edema Hypertension Hyperlipidemia History of PE History of chest pain COPD, wears 3 L of O2 at night Former smoker Hx of epigastric pain and melena   Patient is a 82 year old male with past medical history as mentioned above.  Previous cardiovascular history includes reduced ejection fraction with improvement in 2014.  History of cardiac catheterization in 2013 that showed nonobstructive CAD.  Echocardiogram in 2017 showed EF 55 to 60%, grade 1 DD.  Was admitted in 2021 with volume overload.  Echocardiogram at that time revealed EF reduced to 25 to 30%, grade 2 DD, mild MR and normal RV.  Cardiac catheterization revealed normal coronary arteries, mean PA 17, PCWP 17, LVEDP 13, and CI 3.  Diagnosed with bilateral PEs during [facility] admission in 2020, appears unprovoked.  No prior history of blood clots.  Echocardiogram in 2022 showed EF 20 to 25%, grade 3 DD, normal RV.  Has been followed in CHF clinic though has difficulty with transportation.  Echo 02/2021 showed EF 30 to 35%, mild RV dysfunction.  CHF clinic recommended cardiac MRI.  Last seen by Dr. Carlyle Mitchell on March 23, 2022.  He was taking torsemide as needed.  Due to his ongoing edema, was advised to take torsemide for the next 3 days and then to resume as needed.  Entresto was changed to valsartan.  Because his blood pressures were soft, he was not on Aldactone at the time.  Had a long history of atypical symptoms of chest pain.  Described as a tightness in the mid chest, can occur at rest or with activity, 10 out of 10 in severity, not  positional, worse with eating, better with fluid pill and with nebulizer, was unable to determine how long it lasted in duration.  He was pending GI endoscopic, and Dr. Harl Mitchell stated was okay for clearance from a cardiac standpoint.  Was told to follow-up in 4 months.  Was admitted in December 2023 with abdominal pain and concern for GI bleed with melena.  EGD on July 16, 2022 with findings of Schatzki's ring s/p dilation, intact dundoplication with protruding sutures, otherwise normal-appearing stomach.  Was placed on clear liquid diet with plan to advance to advance diet as tolerated and discharge home.  He left AMA on July 17, 2022.  Our office was contacted in December, and Dr. Harl Mitchell spoke with pulmonologist who felt that it was best to go from [facility] back to [facility].    08/19/2022 - Saw him for follow-up. Stated he was not doing well. CC was weakness, shortness of breath, feet/ankle swelling, and ongoing chest pain.  Described chest pain as in the right middle side of his chest, moved from right side to middle chest, described as tightness, was ongoing for months.  Made better when laying down, worse with eating or drinking.  Similar type of chest pain when he saw Dr. Carlyle Mitchell in August last year.  He did undergo endoscopy and had esophageal dilatation performed.  Daughter presents concern that patient's systolic blood pressure at home is low (<  100), rechecks this to make sure. Pt was staying well hydrated.  Patient denied any palpitations, orthopnea, PND, significant weight changes, acute bleeding, or claudication.  Because of diminished scattered crackles noted to auscultation in office that day, I was concerned patient was showing signs and symptoms of pneumonia and recommended ED evaluation.  Patient declined, and I discussed risk of not being seen in the ED, and he verbalized understanding.  Patient stated he would rather be managed outpatient. Blood work was ordered including CMET and  proBNP with plan to uptitrate torsemide if kidney function was stable and BNP was elevated.  Case was discussed with Dr. Harl Mitchell who recommended continuing valsartan instead of starting Entresto.  2 view chest x-ray was ordered to rule out acute abnormality/pneumonia.  Was told to follow-up in 1 week.  Presented to Gregory Mitchell, ED on January 14 and was instructed to head to ED based on recent chest x-ray and lab results.  Troponins x 2 were flat.  BNP 564.  BP on arrival 180/54.  Creatinine 1.59 baseline creatinine at 1.0-1.2.  CXR showed chronic patchy consolidation, reticulation and bronchiectasis at left greater than right lung base with slightly worse than left lung base consolidation, suggesting worsening of chronic/recurrent infection such as due to recurrent aspiration.  Treated with IV antibiotics.  Tested negative for COVID, flu, RSV and respiratory panel.  Was discharged on Augmentin and 5 days of prednisone for COPD exacerbation.  A few days later after discharge, presented to [facility] for COPD exacerbation.  12-lead EKG showed sinus rhythm with first-degree AV block, RBBB, T wave abnormality.  CXR showed progressive left basilar airspace infiltrate in keeping with acute infection/aspiration.  He was prescribed doxycycline, prednisone, Lasix, and Zofran.  Was discharged home to self-care.  Saw Dr. Kirk Mitchell with CHF clinic last week. Was volume overloaded and symptomatic. Torsemide increased to 20 mg daily x 4 days, then 20 mg every other day with close follow-up of labs. Started on low dose Aldactone. f/u in 3 weeks with APP.   09/20/2022 - Today he presents for follow-up.  He states he is not doing well. CC of weakness and chest pain, similar type of CP as previously.  Chest pain occurs around mealtime and noticed with swallowing.  Has been going on for few months, states this occurs daily.  Does admit to some shortness of breath with his symptoms.  Denies any fever, chills, weight gain,  swelling, palpitations, syncope, presyncope, dizziness, orthopnea, PND, acute bleeding, or claudication. Not staying well hydrated. Reviewed recent CXR results on 08/22/2022 that showed findings consistent with recurrent aspiration. Daughter admits to occasional low SBP readings as last visit with me. Denies any other questions or concerns.   Past Medical History:  Diagnosis Date   Asthma    Chronic renal insufficiency    COPD (chronic obstructive pulmonary disease) (HCC)    Heart failure ([place])    a. EF 25-30% in 08/2019 with cath showing normal cors b. EF 20-25% in 09/2020 c. 30-35% in 02/2021 d. EF 20-25% in 10/2021   Hypertension    Pulmonary emboli ([place]) 03/2019   Renal insufficiency    Type 2 diabetes mellitus ([place])     Past Surgical History:  Procedure Laterality Date   APPENDECTOMY     BALLOON DILATION N/A 03/18/2020   Procedure: BALLOON DILATION;  Surgeon: Eloise Harman, DO;  [facility];  Service: Endoscopy;  Laterality: N/A;   CHOLECYSTECTOMY     COLONOSCOPY  10/2006  Dr.Anwar:normal   ESOPHAGEAL BRUSHING  08/24/2021   Procedure: ESOPHAGEAL BRUSHING;  Surgeon: Eloise Harman, DO;  [facility];  Service: Endoscopy;;   ESOPHAGOGASTRODUODENOSCOPY (EGD) WITH PROPOFOL N/A 03/18/2020   Procedure: ESOPHAGOGASTRODUODENOSCOPY (EGD) WITH PROPOFOL;  Surgeon: Eloise Harman, DO;  [facility];  Service: Endoscopy;  Laterality: N/A;  2:30pm   ESOPHAGOGASTRODUODENOSCOPY (EGD) WITH PROPOFOL N/A 08/24/2021   Procedure: ESOPHAGOGASTRODUODENOSCOPY (EGD) WITH PROPOFOL;  Surgeon: Eloise Harman, DO;  [facility];  Service: Endoscopy;  Laterality: N/A;  with dilation   ESOPHAGOGASTRODUODENOSCOPY (EGD) WITH PROPOFOL N/A 07/16/2022   Procedure: ESOPHAGOGASTRODUODENOSCOPY (EGD) WITH PROPOFOL;  Surgeon: Daneil Dolin, MD;  [facility];  Service: Endoscopy;  Laterality: N/A;   FLEXIBLE SIGMOIDOSCOPY N/A 08/24/2021   Procedure:  FLEXIBLE SIGMOIDOSCOPY;  Surgeon: Eloise Harman, DO;  [facility];  Service: Endoscopy;  Laterality: N/A;   MALONEY DILATION  07/16/2022   Procedure: Venia Minks DILATION;  Surgeon: Daneil Dolin, MD;  [facility];  Service: Endoscopy;;   NASAL SINUS SURGERY     NEPHRECTOMY     RIGHT (Question CA.  No further therapy needed.)   RIGHT/LEFT HEART CATH AND CORONARY ANGIOGRAPHY N/A 09/03/2019   Procedure: RIGHT/LEFT HEART CATH AND CORONARY ANGIOGRAPHY;  Surgeon: Lorretta Harp, MD;  Location: [facility];  Service: Cardiovascular;  Laterality: N/A;    Current Medications: Current Meds  Medication Sig   albuterol (PROVENTIL) (2.5 MG/3ML) 0.083% nebulizer solution Take 3 mLs (2.5 mg total) by nebulization every 4 (four) hours as needed for wheezing or shortness of breath.   albuterol (VENTOLIN HFA) 108 (90 Base) MCG/ACT inhaler Inhale 1-2 puffs into the lungs every 4 (four) hours as needed for shortness of breath.   atorvastatin (LIPITOR) 40 MG tablet Take 1 tablet by mouth daily.   budesonide (PULMICORT) 0.25 MG/2ML nebulizer solution Take 2 mLs (0.25 mg total) by nebulization 2 (two) times daily.   dapagliflozin propanediol (FARXIGA) 10 MG TABS tablet Take 1 tablet (10 mg total) by mouth daily before breakfast.   guaiFENesin (MUCINEX) 600 MG 12 hr tablet Take 1 tablet (600 mg total) by mouth 2 (two) times daily.   HM SENNA-S 8.6-50 MG tablet Take 2 tablets by mouth daily.   HYDROcodone-acetaminophen (NORCO) 7.5-325 MG tablet Take 1 tablet by mouth 2 (two) times daily as needed for moderate pain.   ipratropium-albuterol (DUONEB) 0.5-2.5 (3) MG/3ML SOLN Take 3 mLs by nebulization every 6 (six) hours as needed (shortness of breath).   metoprolol succinate (TOPROL XL) 25 MG 24 hr tablet Take 1 tablet (25 mg total) by mouth daily.   mirtazapine (REMERON) 15 MG tablet Take 15 mg by mouth daily.   OXYGEN Inhale 3 L into the lungs daily as needed (for shortness of  breath).   pantoprazole (PROTONIX) 40 MG tablet Take 40 mg by mouth 2 (two) times daily.   spironolactone (ALDACTONE) 25 MG tablet Take 0.5 tablets (12.5 mg total) by mouth daily.   tamsulosin (FLOMAX) 0.4 MG CAPS capsule Take 0.8 mg by mouth 2 (two) times daily.   torsemide (DEMADEX) 20 MG tablet Take 1 tablet (20 mg total) by mouth every other day.   valsartan (DIOVAN) 80 MG tablet Take 80 mg by mouth daily.     Allergies:   Patient has no known allergies.   Social History   Socioeconomic History   Marital status: Widowed    Spouse name: Not on file  Number of children: Not on file   Years of education: Not on file   Highest education level: Not on file  Occupational History   Not on file  Tobacco Use   Smoking status: Former    Packs/day: 1.00    Years: 15.00    Total pack years: 15.00    Types: Cigarettes    Start date: 07/14/1957    Quit date: 08/09/1976    Years since quitting: 46.1   Smokeless tobacco: Former    Types: Chew    Quit date: 02/10/2006   Tobacco comments:    chewed tobacco for 10 years  Vaping Use   Vaping Use: Never used  Substance and Sexual Activity   Alcohol use: Not Currently    Alcohol/week: 0.0 standard drinks of alcohol    Comment: no etoh since he was in his 10s   Drug use: Never   Sexual activity: Yes  Other Topics Concern   Not on file  Social History Narrative   Lives with wife.  Three children.     Social Determinants of Health   Financial Resource Strain: Not on file  Food Insecurity: No Food Insecurity (08/23/2022)   Hunger Vital Sign    Worried About Running Out of Food in the Last Year: Never true    Ran Out of Food in the Last Year: Never true  Transportation Needs: No Transportation Needs (08/23/2022)   PRAPARE - Hydrologist (Medical): No    Lack of Transportation (Non-Medical): No  Physical Activity: Not on file  Stress: Not on file  Social Connections: Not on file     Family History: The  patient's family history includes Breast cancer in his mother; Lung cancer in his brother; Stroke in his mother. There is no history of Colon cancer.  ROS:   Review of Systems  Constitutional:  Positive for malaise/fatigue. Negative for chills, diaphoresis, fever and weight loss.       See HPI.   HENT: Negative.    Eyes: Negative.   Respiratory:  Positive for shortness of breath. Negative for cough, hemoptysis, sputum production and wheezing.        See HPI.   Cardiovascular:  Positive for chest pain. Negative for palpitations, orthopnea, claudication, leg swelling and PND.       See HPI.   Gastrointestinal: Negative.   Genitourinary: Negative.   Musculoskeletal: Negative.   Skin: Negative.   Neurological:  Positive for weakness. Negative for dizziness, tingling, tremors, sensory change, speech change, focal weakness, seizures, loss of consciousness and headaches.  Endo/Heme/Allergies: Negative.   Psychiatric/Behavioral: Negative.      Please see the history of present illness.    All other systems reviewed and are negative.  EKGs/Labs/Other Studies Reviewed:    The following studies were reviewed today:   EKG:  EKG is not ordered today.    2 view CXR on 08/22/2022:  IMPRESSION: Chronic patchy consolidation, reticulation and bronchiectasis at the left greater than right lung bases, with slightly worsened left lung base consolidation, suggesting worsening of chronic/recurrent infection such as due to recurrent aspiration.   Echocardiogram on July 15, 2022:  1. Left ventricular ejection fraction, by estimation, is 20 to 25%. The  left ventricle has severely decreased function. The left ventricle  demonstrates global hypokinesis. The left ventricular internal cavity size  was moderately dilated. There is mild  left ventricular hypertrophy. Left ventricular diastolic parameters are  indeterminate.   2. Right ventricular systolic function  is normal. The right ventricular   size is normal. There is normal pulmonary artery systolic pressure.   3. Left atrial size was severely dilated.   4. Right atrial size was severely dilated.   5. The mitral valve is normal in structure. No evidence of mitral valve  regurgitation. No evidence of mitral stenosis.   6. The tricuspid valve is abnormal.   7. The aortic valve is tricuspid. Aortic valve regurgitation is not  visualized. No aortic stenosis is present.   8. The inferior vena cava is normal in size with greater than 50%  respiratory variability, suggesting right atrial pressure of 3 mmHg.  Right left heart cath on September 03, 2019: Mr. Stmarie has normal coronary arteries and low filling pressures suggesting that he has been adequately or over diuresed. He did have a high V waves suggesting that he potentially has significant mitral regurgitation. His systolic pressures were in the mid to high 90s and I therefore gave him a 250 cc bolus of saline given his low LVEDP and wedge. He will need guideline directed optimal medical therapy for his LV dysfunction. The sheath removed and a TR band was placed on the right wrist to achieve patent hemostasis. The patient left lab in stable condition. His renal function be carefully monitored. Dr. Davina Poke was notified of these results.  Myoview on Dec 11, 2014: IMPRESSION: 1. Moderate size region of inferior wall myocardial scar. No ischemic territories seen. Overlying soft tissue attenuation artifact cannot entirely be ruled out.   2. Mild inferior wall hypokinesis.   3. Left ventricular ejection fraction 48%   4.  Low to intermediate-risk stress test findings*.  Recent Labs: 07/15/2022: Magnesium 2.2 08/23/2022: ALT 8 08/24/2022: Hemoglobin 7.8; Platelets 223 09/13/2022: B Natriuretic Peptide 209.1; BUN 17; Creatinine, Ser 1.58; Potassium 4.4; Sodium 135  Recent Lipid Panel No results found for: "CHOL", "TRIG", "HDL", "CHOLHDL", "VLDL", "LDLCALC", "LDLDIRECT"   Physical  Exam:    VS:  BP (!) 80/55 (BP Location: Left Arm, Patient Position: Sitting, Cuff Size: Normal)   Pulse 97   Ht 5' 6"$  (1.676 m)   Wt 135 lb (61.2 kg)   SpO2 92%   BMI 21.79 kg/m     Wt Readings from Last 3 Encounters:  09/20/22 135 lb (61.2 kg)  09/13/22 146 lb 3.2 oz (66.3 kg)  08/24/22 136 lb 7.4 oz (61.9 kg)     GEN: Thin, 82 y.o. male, appears fatigued, ill-appearing, has lost weight since last OV HEENT: Normal NECK: No JVD; No carotid bruits CARDIAC: S1/S2, RRR, no murmurs, rubs, gallops; 2+ pulses RESPIRATORY:  Coarse crackles/wheezing bilaterally  MUSCULOSKELETAL:  No edema, generalized weakness, No deformity  SKIN: Warm and dry NEUROLOGIC:  Alert and oriented x 3 PSYCHIATRIC:  Flat affect   ASSESSMENT:    1. Chronic systolic heart failure (HCC)   2. Chest pain of uncertain etiology   3. SOB (shortness of breath)   4. Lung crackles   5. COPD GOLD 3 / obstructive bronchiectasis by HRCT in 2015    6. Essential hypertension, benign   7. Aspiration pneumonia, unspecified aspiration pneumonia type, unspecified laterality, unspecified part of lung ([place])   8. Dysphagia, unspecified type    PLAN:    In order of problems listed above:  Chronic systolic CHF Echocardiogram in 07/2022 revealed EF 20-25%, unchanged from 10/2021. Recommended inpatient management - see below, however patient declined. Discussed risks of outpatient management and he verbalized understanding. Continue current medication regimen. Cannot uptitrate GDMT with  patient's blood pressure readings today - see below. ED precautions discussed. Low sodium diet, fluid restriction <2L, and daily weights encouraged. Educated to contact our office for weight gain of 2 lbs overnight or 5 lbs in one week.   Chest pain Similar presentation at last OV. Chronic, stable for several months. No current chest pain now. Says symptoms occur around meal time and reviewed CXR results which shows findings consistent of  aspiration, discussed that I am concerned he is showing signs of recurrent aspiration PNA. Will refer to GI. Cath 2021 showed normal coronary arteries. ED precautions discussed.   Shortness of breath/Crackles/ COPD/Bronchiectasis  Recurrent since last OV. Wheezing/crackles noted on exam.  Chest x-ray from 1 month ago revealed chronic patchy consolidation, findings suggesting worsening chronic/recurrent infection due to recurrent aspiration.  Symptomatology and signs are consistent with recurrent aspiration pneumonia.  Said his symptoms improved after he took Levaquin.  Will arrange two-view chest x-ray, refer to GI and pulmonary.  Will route this note to his primary care provider.  I recommended ED evaluation, however patient declined.  Discussed risks of delayed evaluation and he verbalizes understanding.  Continue current medication regimen.  ED precautions discussed.  HTN BP today on arrival, 90/42, repeat BP 80/55. Symptomatic. Believe etiology is d/t dehydration/ over diuresed.  SBP < 100 occasionally per daughter's report. Will add midodrine 2.5 mg TID PRN for BP <90/60 to medication regimen, no other changes. Encouraged adequate hydration. Discussed to monitor BP at home at least 2 hours after medications and sitting for 5-10 minutes. Will obtain labs. Continue to monitor and log BP at home. Heart healthy diet and regular cardiovascular exercise as tolerated encouraged.   5. Aspiration/ Dysphagia, recurrent congestion/PNA? Reviewed CXR findings with pt and daughter. Stated that I am concerned that his symptomatology and signs are more due to aspiration PNA. Recently was on Levaquin that helped improve his symptoms. Will arrange 2 view CXR as mentioned above and will update his PCP. Referring to GI as mentioned above. Will recommend a SLP referral for swallowing eval once I get back CXR results and inform his PCP.   5. Disposition: Follow up with me in 1 week or sooner if anything changes. As stated  earlier (point #1),  I recommended inpatient management based on patient's signs and symptoms and PE findings today, however patient declined. Discussed risks of outpatient management and he verbalized understanding.   Medication Adjustments/Labs and Tests Ordered: Current medicines are reviewed at length with the patient today.  Concerns regarding medicines are outlined above.  Orders Placed This Encounter  Procedures   DG Chest 2 View   Ambulatory referral to Pulmonology   Ambulatory referral to Gastroenterology   Meds ordered this encounter  Medications   midodrine (PROAMATINE) 2.5 MG tablet    Sig: Take 1 tablet (2.5 mg total) by mouth 3 (three) times daily as needed (for BP less than 90/60).    Dispense:  30 tablet    Refill:  1    New 09/20/2022    Patient Instructions  Medication Instructions:  Begin Midodrine 2.43m three times per day as needed for BP less than 90/60 Continue all other medications.     Labwork: none  Testing/Procedures: Chest x-ray - order given today   Follow-Up: Office will contact with results via phone, letter or mychart.    1 week    Any Other Special Instructions Will Be Listed Below (If Applicable). You have been referred to Pulmonology   You have been referred  to GI   If you need a refill on your cardiac medications before your next appointment, please call your pharmacy.    SignedFinis Bud, NP  09/21/2022 11:56 AM    [place]

## 2022-09-21 ENCOUNTER — Encounter: Payer: Self-pay | Admitting: *Deleted

## 2022-09-21 ENCOUNTER — Encounter: Payer: Self-pay | Admitting: Nurse Practitioner

## 2022-09-21 ENCOUNTER — Ambulatory Visit (HOSPITAL_COMMUNITY)
Admission: RE | Admit: 2022-09-21 | Discharge: 2022-09-21 | Disposition: A | Discharge status: Home / Self Care | Payer: Medicare HMO | Source: Ambulatory Visit | Attending: Nurse Practitioner | Admitting: Nurse Practitioner

## 2022-09-21 DIAGNOSIS — R0989 Other specified symptoms and signs involving the circulatory and respiratory systems: Secondary | ICD-10-CM | POA: Insufficient documentation

## 2022-09-22 ENCOUNTER — Telehealth: Payer: Self-pay | Admitting: Cardiology

## 2022-09-22 NOTE — Telephone Encounter (Signed)
Advised that cxr is not available yet and he would be contacted once available. Verbalized understanding.

## 2022-09-22 NOTE — Telephone Encounter (Signed)
Daughter called to follow-up on patient's test results.  Daughter stated patient wants results to determine if he needs to be taking antibiotics.

## 2022-09-23 NOTE — Telephone Encounter (Signed)
Daughter called to follow-up on results of chest xray.

## 2022-09-23 NOTE — Telephone Encounter (Signed)
Daughter Apolonio Schneiders informed.

## 2022-09-23 NOTE — Telephone Encounter (Signed)
[facility] contacted, spoke with Opal Sidles who says CXR has not been read yet but she would move it to the top of the list and should expect result later this evening.

## 2022-09-25 ENCOUNTER — Emergency Department (HOSPITAL_COMMUNITY): Payer: Medicare HMO

## 2022-09-25 ENCOUNTER — Other Ambulatory Visit: Payer: Self-pay

## 2022-09-25 ENCOUNTER — Encounter (HOSPITAL_COMMUNITY): Payer: Self-pay | Admitting: Emergency Medicine

## 2022-09-25 ENCOUNTER — Emergency Department (HOSPITAL_COMMUNITY)
Admission: EM | Admit: 2022-09-25 | Discharge: 2022-09-25 | Disposition: A | Discharge status: Home / Self Care | Payer: Medicare HMO | Attending: Emergency Medicine | Admitting: Emergency Medicine

## 2022-09-25 DIAGNOSIS — I5023 Acute on chronic systolic (congestive) heart failure: Secondary | ICD-10-CM | POA: Insufficient documentation

## 2022-09-25 DIAGNOSIS — I2510 Atherosclerotic heart disease of native coronary artery without angina pectoris: Secondary | ICD-10-CM | POA: Insufficient documentation

## 2022-09-25 DIAGNOSIS — I4891 Unspecified atrial fibrillation: Secondary | ICD-10-CM | POA: Diagnosis not present

## 2022-09-25 DIAGNOSIS — Z1152 Encounter for screening for COVID-19: Secondary | ICD-10-CM | POA: Diagnosis not present

## 2022-09-25 DIAGNOSIS — Z79899 Other long term (current) drug therapy: Secondary | ICD-10-CM | POA: Diagnosis not present

## 2022-09-25 DIAGNOSIS — R079 Chest pain, unspecified: Secondary | ICD-10-CM | POA: Insufficient documentation

## 2022-09-25 DIAGNOSIS — N189 Chronic kidney disease, unspecified: Secondary | ICD-10-CM | POA: Diagnosis not present

## 2022-09-25 DIAGNOSIS — R0600 Dyspnea, unspecified: Secondary | ICD-10-CM | POA: Diagnosis not present

## 2022-09-25 DIAGNOSIS — R531 Weakness: Secondary | ICD-10-CM | POA: Diagnosis not present

## 2022-09-25 DIAGNOSIS — R0602 Shortness of breath: Secondary | ICD-10-CM | POA: Insufficient documentation

## 2022-09-25 LAB — COMPREHENSIVE METABOLIC PANEL
ALT: 9 U/L (ref 0–44)
AST: 14 U/L — ABNORMAL LOW (ref 15–41)
Albumin: 2.5 g/dL — ABNORMAL LOW (ref 3.5–5.0)
Alkaline Phosphatase: 58 U/L (ref 38–126)
Anion gap: 7 (ref 5–15)
BUN: 31 mg/dL — ABNORMAL HIGH (ref 8–23)
CO2: 25 mmol/L (ref 22–32)
Calcium: 7.8 mg/dL — ABNORMAL LOW (ref 8.9–10.3)
Chloride: 108 mmol/L (ref 98–111)
Creatinine, Ser: 1.54 mg/dL — ABNORMAL HIGH (ref 0.61–1.24)
GFR, Estimated: 45 mL/min — ABNORMAL LOW (ref >60)
Glucose, Bld: 85 mg/dL (ref 70–99)
Potassium: 4.2 mmol/L (ref 3.5–5.1)
Sodium: 140 mmol/L (ref 135–145)
Total Bilirubin: 0.5 mg/dL (ref 0.3–1.2)
Total Protein: 6.4 g/dL — ABNORMAL LOW (ref 6.5–8.1)

## 2022-09-25 LAB — CBC WITH DIFFERENTIAL/PLATELET
Abs Immature Granulocytes: 0.02 10*3/uL (ref 0.00–0.07)
Basophils Absolute: 0 10*3/uL (ref 0.0–0.1)
Basophils Relative: 0 %
Eosinophils Absolute: 0 10*3/uL (ref 0.0–0.5)
Eosinophils Relative: 1 %
HCT: 27.1 % — ABNORMAL LOW (ref 39.0–52.0)
Hemoglobin: 7.9 g/dL — ABNORMAL LOW (ref 13.0–17.0)
Immature Granulocytes: 0 %
Lymphocytes Relative: 15 %
Lymphs Abs: 1.2 10*3/uL (ref 0.7–4.0)
MCH: 26.2 pg (ref 26.0–34.0)
MCHC: 29.2 g/dL — ABNORMAL LOW (ref 30.0–36.0)
MCV: 90 fL (ref 80.0–100.0)
Monocytes Absolute: 0.6 10*3/uL (ref 0.1–1.0)
Monocytes Relative: 8 %
Neutro Abs: 5.9 10*3/uL (ref 1.7–7.7)
Neutrophils Relative %: 76 %
Platelets: 298 10*3/uL (ref 150–400)
RBC: 3.01 MIL/uL — ABNORMAL LOW (ref 4.22–5.81)
RDW: 14.9 % (ref 11.5–15.5)
WBC: 7.7 10*3/uL (ref 4.0–10.5)
nRBC: 0 % (ref 0.0–0.2)

## 2022-09-25 LAB — LACTIC ACID, PLASMA
Lactic Acid, Venous: 1.3 mmol/L (ref 0.5–1.9)
Lactic Acid, Venous: 1.4 mmol/L (ref 0.5–1.9)

## 2022-09-25 LAB — TROPONIN I (HIGH SENSITIVITY)
Troponin I (High Sensitivity): 36 ng/L — ABNORMAL HIGH (ref <18)
Troponin I (High Sensitivity): 34 ng/L — ABNORMAL HIGH (ref <18)

## 2022-09-25 LAB — RESP PANEL BY RT-PCR (RSV, FLU A&B, COVID)  RVPGX2
Influenza A by PCR: NEGATIVE
Influenza B by PCR: NEGATIVE
Resp Syncytial Virus by PCR: NEGATIVE
SARS Coronavirus 2 by RT PCR: NEGATIVE

## 2022-09-25 LAB — BRAIN NATRIURETIC PEPTIDE: B Natriuretic Peptide: 791 pg/mL — ABNORMAL HIGH (ref 0.0–100.0)

## 2022-09-25 MED ORDER — FUROSEMIDE 10 MG/ML IJ SOLN
40.0000 mg | Freq: Once | INTRAMUSCULAR | Status: AC
  Administered 2022-09-25: 40 mg via INTRAVENOUS
  Filled 2022-09-25: qty 4

## 2022-09-25 MED ORDER — LACTATED RINGERS IV BOLUS
500.0000 mL | Freq: Once | INTRAVENOUS | Status: AC
  Administered 2022-09-25: 500 mL via INTRAVENOUS

## 2022-09-25 MED ORDER — FENTANYL CITRATE PF 50 MCG/ML IJ SOSY
12.5000 ug | PREFILLED_SYRINGE | Freq: Once | INTRAMUSCULAR | Status: AC
  Administered 2022-09-25: 12.5 ug via INTRAVENOUS
  Filled 2022-09-25: qty 1

## 2022-09-25 NOTE — ED Provider Notes (Signed)
[facility] Provider Note   CSN: [id number] Arrival date & time: 09/25/22  1149     History {Add pertinent medical, surgical, social history, OB history to HPI:1} Chief Complaint  Patient presents with   Chest Pain    Gregory Mitchell is a 82 y.o. male.  HPI Patient presents with his daughter provides many details. He is here due to chest pain.  Pain began over the past day, is upper left-sided, intermittent, with associated dyspnea.  Patient's history includes cardiomyopathy, pneumonia last month, CKD.  He is on home oxygen intermittently. He denies confusion, abdominal pain, fever, as does she.  She notes that slightly more than 1 week ago he began having change in his Lasix dosing due to worsening renal function.  2 days ago the patient had an episode of new lower extremity weakness, was seen by EMS, declined hospital transport.  Patient has a history of A-fib and is not anticoagulated due to history of low blood counts according to family.    Home Medications Prior to Admission medications   Medication Sig Start Date End Date Taking? Authorizing Provider  albuterol (PROVENTIL) (2.5 MG/3ML) 0.083% nebulizer solution Take 3 mLs (2.5 mg total) by nebulization every 4 (four) hours as needed for wheezing or shortness of breath. 04/16/22 04/16/23  Roxan Hockey, MD  albuterol (VENTOLIN HFA) 108 (90 Base) MCG/ACT inhaler Inhale 1-2 puffs into the lungs every 4 (four) hours as needed for shortness of breath. 07/13/22   [provider]  atorvastatin (LIPITOR) 40 MG tablet Take 1 tablet by mouth daily.    [provider]  budesonide (PULMICORT) 0.25 MG/2ML nebulizer solution Take 2 mLs (0.25 mg total) by nebulization 2 (two) times daily. 01/28/22   Tanda Rockers, MD  dapagliflozin propanediol (FARXIGA) 10 MG TABS tablet Take 1 tablet (10 mg total) by mouth daily before breakfast. 02/25/22   Arnoldo Lenis, MD  guaiFENesin  (MUCINEX) 600 MG 12 hr tablet Take 1 tablet (600 mg total) by mouth 2 (two) times daily. 04/16/22 04/16/23  Roxan Hockey, MD  HM SENNA-S 8.6-50 MG tablet Take 2 tablets by mouth daily. 07/10/21   [provider]  HYDROcodone-acetaminophen (NORCO) 7.5-325 MG tablet Take 1 tablet by mouth 2 (two) times daily as needed for moderate pain. 11/27/20   [provider]  ipratropium-albuterol (DUONEB) 0.5-2.5 (3) MG/3ML SOLN Take 3 mLs by nebulization every 6 (six) hours as needed (shortness of breath). 06/30/22   [provider]  metoprolol succinate (TOPROL XL) 25 MG 24 hr tablet Take 1 tablet (25 mg total) by mouth daily. 12/18/21   Strader, Fransisco Hertz, PA-C  midodrine (PROAMATINE) 2.5 MG tablet Take 1 tablet (2.5 mg total) by mouth 3 (three) times daily as needed (for BP less than 90/60). 09/20/22   Finis Bud, NP  mirtazapine (REMERON) 15 MG tablet Take 15 mg by mouth daily. 07/07/22   [provider]  OXYGEN Inhale 3 L into the lungs daily as needed (for shortness of breath).    [provider]  pantoprazole (PROTONIX) 40 MG tablet Take 40 mg by mouth 2 (two) times daily.    [provider]  spironolactone (ALDACTONE) 25 MG tablet Take 0.5 tablets (12.5 mg total) by mouth daily. 09/13/22   Larey Dresser, MD  tamsulosin (FLOMAX) 0.4 MG CAPS capsule Take 0.8 mg by mouth 2 (two) times daily.    [provider]  torsemide (DEMADEX) 20 MG tablet Take 1  tablet (20 mg total) by mouth every other day. 09/13/22   Larey Dresser, MD  valsartan (DIOVAN) 80 MG tablet Take 80 mg by mouth daily. 07/13/22   [provider]      Allergies    Patient has no known allergies.    Review of Systems   Review of Systems  All other systems reviewed and are negative.   Physical Exam Updated Vital Signs BP (S) (!) 87/65 (BP Location: Right Arm)   Pulse 95   Temp 98.2 F (36.8 C) (Oral)   Resp 20   Ht 5' 6"$  (1.676 m)   Wt 60.3 kg   SpO2 92%    BMI 21.47 kg/m  Physical Exam Vitals and nursing note reviewed.  Constitutional:      Appearance: He is ill-appearing.  HENT:     Head: Normocephalic and atraumatic.  Eyes:     Conjunctiva/sclera: Conjunctivae normal.  Cardiovascular:     Rate and Rhythm: Normal rate and regular rhythm.  Pulmonary:     Effort: Pulmonary effort is normal. No respiratory distress.     Breath sounds: No stridor.  Abdominal:     General: There is no distension.  Musculoskeletal:     Comments: Bilateral lower extremity weakness 3/5  Skin:    General: Skin is warm and dry.  Neurological:     Mental Status: He is alert and oriented to person, place, and time.     ED Results / Procedures / Treatments   Labs (all labs ordered are listed, but only abnormal results are displayed) Labs Reviewed - No data to display  EKG None  Radiology No results found.  Procedures Procedures  {Document cardiac monitor, telemetry assessment procedure when appropriate:1}  Medications Ordered in ED Medications  lactated ringers bolus 500 mL (has no administration in time range)    ED Course/ Medical Decision Making/ A&P This patient with a Hx of CKD, CAD, cardiomyopathy, diabetes presents to the ED for concern of this, chest pain, this involves an extensive number of treatment options, and is a complaint that carries with it a high risk of complications and morbidity.    The differential diagnosis includes prior stroke, recurrent infection, dehydration, iatrogenic effects   Social Determinants of Health:  Advanced age, multiple medical problems  Additional history obtained:  Additional history and/or information obtained from her at bedside and chart review, notable for details including HPI, and discharge from admission for pneumonia last month reviewed   After the initial evaluation, orders, including: T x-ray labs monitoring 500 mL fluid bolus were initiated.   Patient placed on Cardiac and  Pulse-Oximetry Monitors. The patient was maintained on a cardiac monitor.  The cardiac monitored showed an rhythm of A-fib 95 abnormal  The patient was also maintained on pulse oximetry. The readings were typically 92% room air abnormal  Echocardiogram from last year reviewed, EF 20, 25%   On repeat evaluation of the patient stayed the same  Lab Tests:  I personally interpreted labs.  The pertinent results include: BNP 4 times greater than this most recent value from 12 days ago  Imaging Studies ordered:  I independently visualized and interpreted imaging which showed diffuse pulmonary congestion I agree with the radiologist interpretation Dispostion / Final MDM:  After consideration of the diagnostic results and the patient's response to treatment, patient will be admitted for further monitoring, management.  This adult male presents with chest pain, in the context of multiple medical problems, including COPD, cardiomyopathy, including  EF 20, 25%.  Patient with no lactic acidosis, nonischemic EKG, though with known A-fib, this is redemonstrated.  Patient is anticoagulated.  Patient is awaiting troponin value, admission.  Diuretics have been provided.  Final Clinical Impression(s) / ED Diagnoses Final diagnoses:  Acute on chronic systolic congestive heart failure ([place])

## 2022-09-25 NOTE — ED Provider Notes (Signed)
Patient with no symptoms now.  Patient improved with the diuretic.  His troponins are mildly elevated but stable.  He has an appointment in 2 days with his cardiologist and he plans to keep it.   Milton Ferguson, MD 09/25/22 [id number]

## 2022-09-25 NOTE — ED Triage Notes (Signed)
Per daughter patient was assessed x2 days ago by paramedic for possible stroke. Patient has weakness and tremors. Patient hypotensive at time. Patient started feeling better and was not brought to ED. Patient does have hx of afib and CHF. Patient has afib with rate ranging 80s-110. No swelling noted in extremities.

## 2022-09-25 NOTE — ED Triage Notes (Signed)
Patient c/o mid-sternal chest pain that started last night. Denies any radiation in pain. Per patient shortness of breath, nausea, weakness, and dizziness. Denies any vomiting or back pain. Denies any cardiac hx. Patient does wear O2 viz [place] at night as needed.

## 2022-09-25 NOTE — Discharge Instructions (Addendum)
Follow-up with your cardiologist this week for recheck

## 2022-09-25 NOTE — ED Triage Notes (Signed)
Patient denies cough or any signs of infection. Afebrile. Blood pressure 87/65. Dr Vanita Panda made aware coming to room to assess.

## 2022-09-27 ENCOUNTER — Telehealth: Payer: Self-pay | Admitting: Nurse Practitioner

## 2022-09-27 ENCOUNTER — Ambulatory Visit: Payer: Medicare HMO | Attending: Nurse Practitioner | Admitting: Nurse Practitioner

## 2022-09-27 ENCOUNTER — Encounter: Payer: Self-pay | Admitting: Nurse Practitioner

## 2022-09-27 VITALS — BP 116/50 | HR 88 | Ht 66.0 in | Wt 138.0 lb

## 2022-09-27 DIAGNOSIS — I5022 Chronic systolic (congestive) heart failure: Secondary | ICD-10-CM

## 2022-09-27 DIAGNOSIS — R0989 Other specified symptoms and signs involving the circulatory and respiratory systems: Secondary | ICD-10-CM

## 2022-09-27 DIAGNOSIS — J479 Bronchiectasis, uncomplicated: Secondary | ICD-10-CM

## 2022-09-27 DIAGNOSIS — T17908D Unspecified foreign body in respiratory tract, part unspecified causing other injury, subsequent encounter: Secondary | ICD-10-CM

## 2022-09-27 DIAGNOSIS — R079 Chest pain, unspecified: Secondary | ICD-10-CM | POA: Diagnosis not present

## 2022-09-27 DIAGNOSIS — R0602 Shortness of breath: Secondary | ICD-10-CM

## 2022-09-27 DIAGNOSIS — I10 Essential (primary) hypertension: Secondary | ICD-10-CM

## 2022-09-27 DIAGNOSIS — J449 Chronic obstructive pulmonary disease, unspecified: Secondary | ICD-10-CM

## 2022-09-27 DIAGNOSIS — R1310 Dysphagia, unspecified: Secondary | ICD-10-CM

## 2022-09-27 DIAGNOSIS — J690 Pneumonitis due to inhalation of food and vomit: Secondary | ICD-10-CM

## 2022-09-27 NOTE — Telephone Encounter (Signed)
Called and s/w Clinic RN Abigail Butts) today and updated her about patient status and recent 1 view CXR results and my concern for aspiration. Told her that I recommended pt follow-up with PCP within the next 1-2 weeks for follow-up and I have referred to Pulmonology and GI ASAP. She verbalized understanding and have notified my LPN Lovina Reach) to fax over recent CXR results to PCP's office.   Finis Bud, NP

## 2022-09-27 NOTE — Progress Notes (Unsigned)
Cardiology Office Note:    Date:  09/27/2022  [name], DOB May 07, 1941, MRN [id number]  PCP:  Neale Burly, MD   [facility] Cardiologist:  Carlyle Dolly, MD     Referring MD: Neale Burly, MD   CC: Here for follow-up  History of Present Illness:    Gregory Mitchell is a 82 y.o. male with a hx of the following:  Chronic systolic CHF, leg edema Hypertension Hyperlipidemia History of PE History of chest pain COPD, wears 3 L of O2 at night Former smoker Hx of epigastric pain and melena   Patient is a 82 year old male with past medical history as mentioned above.  Previous cardiovascular history includes reduced ejection fraction with improvement in 2014.  History of cardiac catheterization in 2013 that showed nonobstructive CAD.  Echocardiogram in 2017 showed EF 55 to 60%, grade 1 DD.  Was admitted in 2021 with volume overload.  Echocardiogram at that time revealed EF reduced to 25 to 30%, grade 2 DD, mild MR and normal RV.  Cardiac catheterization revealed normal coronary arteries, mean PA 17, PCWP 17, LVEDP 13, and CI 3.  Diagnosed with bilateral PEs during [facility] admission in 2020, appears unprovoked.  No prior history of blood clots.  Echocardiogram in 2022 showed EF 20 to 25%, grade 3 DD, normal RV.  Has been followed in CHF clinic though has difficulty with transportation.  Echo 02/2021 showed EF 30 to 35%, mild RV dysfunction.  CHF clinic recommended cardiac MRI.  Last seen by Dr. Carlyle Dolly on March 23, 2022.  He was taking torsemide as needed.  Due to his ongoing edema, was advised to take torsemide for the next 3 days and then to resume as needed.  Entresto was changed to valsartan.  Because his blood pressures were soft, he was not on Aldactone at the time.  Had a long history of atypical symptoms of chest pain.  Described as a tightness in the mid chest, can occur at rest or with activity, 10 out of 10 in severity, not  positional, worse with eating, better with fluid pill and with nebulizer, was unable to determine how long it lasted in duration.  He was pending GI endoscopic, and Dr. Harl Bowie stated was okay for clearance from a cardiac standpoint.  Was told to follow-up in 4 months.  Was admitted in December 2023 with abdominal pain and concern for GI bleed with melena.  EGD on July 16, 2022 with findings of Schatzki's ring s/p dilation, intact dundoplication with protruding sutures, otherwise normal-appearing stomach.  Was placed on clear liquid diet with plan to advance to advance diet as tolerated and discharge home.  He left AMA on July 17, 2022.  Our office was contacted in December, and Dr. Harl Bowie spoke with pulmonologist who felt that it was best to go from [facility] back to [facility].    08/19/2022 - Saw him for follow-up. Stated he was not doing well. CC was weakness, shortness of breath, feet/ankle swelling, and ongoing chest pain.  Described chest pain as in the right middle side of his chest, moved from right side to middle chest, described as tightness, was ongoing for months.  Made better when laying down, worse with eating or drinking.  Similar type of chest pain when he saw Dr. Carlyle Dolly in August last year.  He did undergo endoscopy and had esophageal dilatation performed.  Daughter presents concern that patient's systolic blood pressure at home is low (<  100), rechecks this to make sure. Pt was staying well hydrated.  Patient denied any palpitations, orthopnea, PND, significant weight changes, acute bleeding, or claudication.  Because of diminished scattered crackles noted to auscultation in office that day, I was concerned patient was showing signs and symptoms of pneumonia and recommended ED evaluation.  Patient declined, and I discussed risk of not being seen in the ED, and he verbalized understanding.  Patient stated he would rather be managed outpatient. Blood work was ordered including CMET and  proBNP with plan to uptitrate torsemide if kidney function was stable and BNP was elevated.  Case was discussed with Dr. Harl Bowie who recommended continuing valsartan instead of starting Entresto.  2 view chest x-ray was ordered to rule out acute abnormality/pneumonia.  Was told to follow-up in 1 week.  Presented to [name] ED on January 14 and was instructed to head to ED based on recent chest x-ray and lab results.  Troponins x 2 were flat.  BNP 564.  BP on arrival 180/54.  Creatinine 1.59 baseline creatinine at 1.0-1.2.  CXR showed chronic patchy consolidation, reticulation and bronchiectasis at left greater than right lung base with slightly worse than left lung base consolidation, suggesting worsening of chronic/recurrent infection such as due to recurrent aspiration.  Treated with IV antibiotics.  Tested negative for COVID, flu, RSV and respiratory panel.  Was discharged on Augmentin and 5 days of prednisone for COPD exacerbation.  A few days later after discharge, presented to [facility] for COPD exacerbation.  12-lead EKG showed sinus rhythm with first-degree AV block, RBBB, T wave abnormality.  CXR showed progressive left basilar airspace infiltrate in keeping with acute infection/aspiration.  He was prescribed doxycycline, prednisone, Lasix, and Zofran.  Was discharged home to self-care.  Saw Dr. Kirk Ruths with CHF clinic last week. Was volume overloaded and symptomatic. Torsemide increased to 20 mg daily x 4 days, then 20 mg every other day with close follow-up of labs. Started on low dose Aldactone. f/u in 3 weeks with APP.   09/20/2022 - Was not doing well. CC of weakness and chest pain, similar type of CP as previously. CP occurred around mealtime and noticed with swallowing, ongoing x few months, daily.  Admitted to shortness of breath with his symptoms.  Not staying well hydrated. Reviewed recent CXR results on 08/22/2022 that showed findings consistent with recurrent aspiration. Daughter  admitted to occasional low SBP readings as last visit with me. 2 view CXR was negative for anything acute. Pulmonary and GI referrals were made for concern of aspiration. Midodrine was added to medication regimen.   09/25/2022 - Presented to [facility] with CP. BP on arrival 87/65. Was having lower extremity weakness, 3/5. Monitor showed A-fib, HR 95. Was given IV fluids. Troponins flat. Resp panel negative. BNP 791. Received IV diuretic. Rest of labs stable. CXR revealed diffuse pulmonary congestion. Told to f/u with outpatient cardiology.   09/27/2022 - Still not doing well. No changes since last OV, same symptoms. Daughter says midodrine has helped some. Pt says he needs and ABX and prednisone. Denies any other questions or concerns.   Past Medical History:  Diagnosis Date   Asthma    Chronic renal insufficiency    COPD (chronic obstructive pulmonary disease) (HCC)    Heart failure ([place])    a. EF 25-30% in 08/2019 with cath showing normal cors b. EF 20-25% in 09/2020 c. 30-35% in 02/2021 d. EF 20-25% in 10/2021   Hypertension    Pulmonary emboli ([place])  03/2019   Renal insufficiency    Type 2 diabetes mellitus ([place])     Past Surgical History:  Procedure Laterality Date   APPENDECTOMY     BALLOON DILATION N/A 03/18/2020   Procedure: BALLOON DILATION;  Surgeon: Eloise Harman, DO;  Location[facility];  Service: Endoscopy;  Laterality: N/A;   CHOLECYSTECTOMY     COLONOSCOPY  10/2006   Dr.Anwar:normal   ESOPHAGEAL BRUSHING  08/24/2021   Procedure: ESOPHAGEAL BRUSHING;  Surgeon: Eloise Harman, DO;  Location: [facility];  Service: Endoscopy;;   ESOPHAGOGASTRODUODENOSCOPY (EGD) WITH PROPOFOL N/A 03/18/2020   Procedure: ESOPHAGOGASTRODUODENOSCOPY (EGD) WITH PROPOFOL;  Surgeon: Eloise Harman, DO;  Location: [facility];  Service: Endoscopy;  Laterality: N/A;  2:30pm   ESOPHAGOGASTRODUODENOSCOPY (EGD) WITH PROPOFOL N/A 08/24/2021   Procedure: ESOPHAGOGASTRODUODENOSCOPY (EGD) WITH  PROPOFOL;  Surgeon: Eloise Harman, DO;  [facility];  Service: Endoscopy;  Laterality: N/A;  with dilation   ESOPHAGOGASTRODUODENOSCOPY (EGD) WITH PROPOFOL N/A 07/16/2022   Procedure: ESOPHAGOGASTRODUODENOSCOPY (EGD) WITH PROPOFOL;  Surgeon: Daneil Dolin, MD;  Location: [facility];  Service: Endoscopy;  Laterality: N/A;   FLEXIBLE SIGMOIDOSCOPY N/A 08/24/2021   Procedure: FLEXIBLE SIGMOIDOSCOPY;  Surgeon: Eloise Harman, DO;  [facility];  Service: Endoscopy;  Laterality: N/A;   MALONEY DILATION  07/16/2022   Procedure: Venia Minks DILATION;  Surgeon: Daneil Dolin, MD;  Location[facility];  Service: Endoscopy;;   NASAL SINUS SURGERY     NEPHRECTOMY     RIGHT (Question CA.  No further therapy needed.)   RIGHT/LEFT HEART CATH AND CORONARY ANGIOGRAPHY N/A 09/03/2019   Procedure: RIGHT/LEFT HEART CATH AND CORONARY ANGIOGRAPHY;  Surgeon: Lorretta Harp, MD;  Location: [facility];  Service: Cardiovascular;  Laterality: N/A;   Current Meds  Medication Sig   albuterol (PROVENTIL) (2.5 MG/3ML) 0.083% nebulizer solution Take 3 mLs (2.5 mg total) by nebulization every 4 (four) hours as needed for wheezing or shortness of breath.   albuterol (VENTOLIN HFA) 108 (90 Base) MCG/ACT inhaler Inhale 1-2 puffs into the lungs every 4 (four) hours as needed for shortness of breath.   atorvastatin (LIPITOR) 40 MG tablet Take 1 tablet by mouth daily.   dapagliflozin propanediol (FARXIGA) 10 MG TABS tablet Take 1 tablet (10 mg total) by mouth daily before breakfast.   HM SENNA-S 8.6-50 MG tablet Take 2 tablets by mouth daily.   HYDROcodone-acetaminophen (NORCO) 7.5-325 MG tablet Take 1 tablet by mouth 2 (two) times daily as needed for moderate pain.   metoprolol succinate (TOPROL XL) 25 MG 24 hr tablet Take 1 tablet (25 mg total) by mouth daily.   midodrine (PROAMATINE) 2.5 MG tablet Take 1 tablet (2.5 mg total) by mouth 3 (three) times daily as needed (for BP less than  90/60).   mirtazapine (REMERON) 15 MG tablet Take 15 mg by mouth daily.   OXYGEN Inhale 3 L into the lungs daily as needed (for shortness of breath).   pantoprazole (PROTONIX) 40 MG tablet Take 40 mg by mouth 2 (two) times daily.   spironolactone (ALDACTONE) 25 MG tablet Take 0.5 tablets (12.5 mg total) by mouth daily.   torsemide (DEMADEX) 20 MG tablet Take 1 tablet (20 mg total) by mouth every other day.   valsartan (DIOVAN) 80 MG tablet Take 80 mg by mouth daily.    Allergies:   Patient has no known allergies.   Social History   Socioeconomic History   Marital status:  Widowed    Spouse name: Not on file   Number of children: Not on file   Years of education: Not on file   Highest education level: Not on file  Occupational History   Not on file  Tobacco Use   Smoking status: Former    Packs/day: 1.00    Years: 15.00    Total pack years: 15.00    Types: Cigarettes    Start date: 07/14/1957    Quit date: 08/09/1976    Years since quitting: 46.1   Smokeless tobacco: Former    Types: Chew    Quit date: 02/10/2006   Tobacco comments:    chewed tobacco for 10 years  Vaping Use   Vaping Use: Never used  Substance and Sexual Activity   Alcohol use: Not Currently    Alcohol/week: 0.0 standard drinks of alcohol    Comment: no etoh since he was in his 52s   Drug use: Never   Sexual activity: Yes  Other Topics Concern   Not on file  Social History Narrative   Lives with wife.  Three children.     Social Determinants of Health   Financial Resource Strain: Not on file  Food Insecurity: No Food Insecurity (08/23/2022)   Hunger Vital Sign    Worried About Running Out of Food in the Last Year: Never true    Ran Out of Food in the Last Year: Never true  Transportation Needs: No Transportation Needs (08/23/2022)   PRAPARE - Hydrologist (Medical): No    Lack of Transportation (Non-Medical): No  Physical Activity: Not on file  Stress: Not on file   Social Connections: Not on file     Family History: The patient's family history includes Breast cancer in his mother; Lung cancer in his brother; Stroke in his mother. There is no history of Colon cancer.  ROS:   Review of Systems  Constitutional:  Positive for malaise/fatigue. Negative for chills, diaphoresis, fever and weight loss.       See HPI.   HENT: Negative.    Eyes: Negative.   Respiratory:  Positive for shortness of breath. Negative for cough, hemoptysis, sputum production and wheezing.        See HPI.   Cardiovascular:  Positive for chest pain. Negative for palpitations, orthopnea, claudication, leg swelling and PND.       See HPI.   Gastrointestinal: Negative.   Genitourinary: Negative.   Musculoskeletal: Negative.   Skin: Negative.   Neurological:  Positive for weakness. Negative for dizziness, tingling, tremors, sensory change, speech change, focal weakness, seizures, loss of consciousness and headaches.  Endo/Heme/Allergies: Negative.   Psychiatric/Behavioral: Negative.      Please see the history of present illness.    All other systems reviewed and are negative.  EKGs/Labs/Other Studies Reviewed:    The following studies were reviewed today:   EKG:  EKG is not ordered today.     1 view CXR on 09/25/2022:  MPRESSION: Diffuse bilateral interstitial pulmonary opacity, consistent with edema or atypical/viral infection. No focal airspace opacity.  2 view CXR on 09/21/2022:  Chronic changes without acute findings.   2 view CXR on 08/22/2022:  IMPRESSION: Chronic patchy consolidation, reticulation and bronchiectasis at the left greater than right lung bases, with slightly worsened left lung base consolidation, suggesting worsening of chronic/recurrent infection such as due to recurrent aspiration.   Echocardiogram on July 15, 2022:  1. Left ventricular ejection fraction, by estimation, is 20  to 25%. The  left ventricle has severely decreased function.  The left ventricle  demonstrates global hypokinesis. The left ventricular internal cavity size  was moderately dilated. There is mild  left ventricular hypertrophy. Left ventricular diastolic parameters are  indeterminate.   2. Right ventricular systolic function is normal. The right ventricular  size is normal. There is normal pulmonary artery systolic pressure.   3. Left atrial size was severely dilated.   4. Right atrial size was severely dilated.   5. The mitral valve is normal in structure. No evidence of mitral valve  regurgitation. No evidence of mitral stenosis.   6. The tricuspid valve is abnormal.   7. The aortic valve is tricuspid. Aortic valve regurgitation is not  visualized. No aortic stenosis is present.   8. The inferior vena cava is normal in size with greater than 50%  respiratory variability, suggesting right atrial pressure of 3 mmHg.  Right left heart cath on September 03, 2019: Mr. Mon has normal coronary arteries and low filling pressures suggesting that he has been adequately or over diuresed. He did have a high V waves suggesting that he potentially has significant mitral regurgitation. His systolic pressures were in the mid to high 90s and I therefore gave him a 250 cc bolus of saline given his low LVEDP and wedge. He will need guideline directed optimal medical therapy for his LV dysfunction. The sheath removed and a TR band was placed on the right wrist to achieve patent hemostasis. The patient left lab in stable condition. His renal function be carefully monitored. Dr. Davina Poke was notified of these results.  Myoview on Dec 11, 2014: IMPRESSION: 1. Moderate size region of inferior wall myocardial scar. No ischemic territories seen. Overlying soft tissue attenuation artifact cannot entirely be ruled out.   2. Mild inferior wall hypokinesis.   3. Left ventricular ejection fraction 48%   4.  Low to intermediate-risk stress test findings*.  Recent  Labs: 07/15/2022: Magnesium 2.2 09/25/2022: ALT 9; B Natriuretic Peptide 791.0; BUN 31; Creatinine, Ser 1.54; Hemoglobin 7.9; Platelets 298; Potassium 4.2; Sodium 140  Recent Lipid Panel No results found for: "CHOL", "TRIG", "HDL", "CHOLHDL", "VLDL", "LDLCALC", "LDLDIRECT"   Physical Exam:    VS:  BP (!) 116/50   Pulse 88   Ht 5' 6"$  (1.676 m)   Wt 138 lb (62.6 kg)   SpO2 93%   BMI 22.27 kg/m     Wt Readings from Last 3 Encounters:  09/27/22 138 lb (62.6 kg)  09/25/22 133 lb (60.3 kg)  09/20/22 135 lb (61.2 kg)     GEN: Thin, 82 y.o. male, appears fatigued, ill-appearing, sitting in wheelchair HEENT: Normal NECK: No JVD; No carotid bruits CARDIAC: S1/S2, RRR, no murmurs, rubs, gallops; 2+ pulses RESPIRATORY:  Coarse crackles/ diminished bilaterally  MUSCULOSKELETAL:  No edema, generalized weakness, No deformity  SKIN: Warm and dry NEUROLOGIC:  Alert and oriented x 3 PSYCHIATRIC:  Flat affect   ASSESSMENT:    1. Chronic systolic heart failure (HCC)   2. Chest pain of uncertain etiology   3. SOB (shortness of breath)   4. Lung crackles   5. Chronic obstructive pulmonary disease, unspecified COPD type ([place])   6. Bronchiectasis without complication ([place])   7. Essential hypertension, benign   8. Aspiration into airway, subsequent encounter   9. Dysphagia, unspecified type    PLAN:    In order of problems listed above:  Chronic systolic CHF Echocardiogram in 07/2022 revealed EF 20-25%, unchanged from 10/2021.  Continue current medication regimen. Cannot uptitrate GDMT at this time. ED precautions discussed. Low sodium diet, fluid restriction <2L, and daily weights encouraged. Educated to contact our office for weight gain of 2 lbs overnight or 5 lbs in one week. Just received IV diuretic in ED on 09/25/2022. Pt's daughter declines increasing fluid pill at this time. At next visit, discuss/recommend palliative care. F/U with HF clinic next week.   Chest pain Similar  presentation at last OV. Chronic, stable for several months. No current chest pain now. Says symptoms occur around meal time and reviewed previous CXR results which shows findings consistent of aspiration. Will place referral for GI and Pulmonology for pt to be seen ASAP. Cath 2021 showed normal coronary arteries. ED precautions discussed.   Shortness of breath/Crackles/ COPD/Bronchiectasis  Recurrent since last OV. Wheezing/crackles noted on exam.  Chest x-ray from 1 month ago revealed chronic patchy consolidation, findings suggesting worsening chronic/recurrent infection due to recurrent aspiration.  Symptomatology and signs are consistent with recurrent aspiration pneumonia.  Said his symptoms improved after he took Levaquin.  Will refer to GI and pulmonary ASAP. Have notified PCP's office today.  Continue current medication regimen.  ED precautions discussed.  HTN BP today improved since last OV.  Symptomatic. Believe etiology is d/t dehydration/ over diuresed.  SBP < 100 occasionally per daughter's report. Recently hypotensive in ED visit on 09/25/22. Continue midodrine. Encouraged adequate hydration. Discussed to monitor BP at home at least 2 hours after medications and sitting for 5-10 minutes. Will obtain labs. Continue to monitor and log BP at home. Heart healthy diet and regular cardiovascular exercise as tolerated encouraged.   5. Aspiration/ Dysphagia, recurrent congestion/PNA? Reviewed past CXR findings with pt and daughter. Stated that I am concerned that his symptomatology and signs are more due to aspiration. Recently was on Levaquin that helped improve his symptoms for past episode of PNA. Referring to GI as mentioned above. Will recommend a SLP referral for swallowing eval once.   5. Disposition: Follow up with me or APP in 6-8 weeks.   Medication Adjustments/Labs and Tests Ordered: Current medicines are reviewed at length with the patient today.  Concerns regarding medicines are  outlined above.  Orders Placed This Encounter  Procedures   Ambulatory referral to Pulmonology   Ambulatory referral to Gastroenterology   No orders of the defined types were placed in this encounter.   Patient Instructions  Medication Instructions:  Your physician recommends that you continue on your current medications as directed. Please refer to the Current Medication list given to you today.  *If you need a refill on your cardiac medications before your next appointment, please call your pharmacy*   Lab Work: NONE   If you have labs (blood work) drawn today and your tests are completely normal, you will receive your results only by: [place] (if you have MyChart) OR A paper copy in the mail If you have any lab test that is abnormal or we need to change your treatment, we will call you to review the results.   Testing/Procedures: NONE    Follow-Up: At [facility], you and your health needs are our priority.  As part of our continuing mission to provide you with exceptional heart care, we have created designated Provider Care Teams.  These Care Teams include your primary Cardiologist (physician) and Advanced Practice Providers (APPs -  Physician Assistants and Nurse Practitioners) who all work together to provide you with the care you need, when you need  it.  We recommend signing up for the patient portal called "MyChart".  Sign up information is provided on this After Visit Summary.  MyChart is used to connect with patients for Virtual Visits (Telemedicine).  Patients are able to view lab/test results, encounter notes, upcoming appointments, etc.  Non-urgent messages can be sent to your provider as well.   To learn more about what you can do with MyChart, go to [email or web address].    Your next appointment:   6-8 week(s)  Provider:   Finis Bud, NP    Other Instructions Thank you for choosing [facility]!       Signed, Finis Bud, NP  09/28/2022 1:02 PM    [place]

## 2022-09-27 NOTE — Progress Notes (Unsigned)
Chest x-ray faxed to pcp.

## 2022-09-27 NOTE — Telephone Encounter (Signed)
Gregory Mitchell from pt's PCP office states they received a call from pt's daughter today stating that pt needed to be prescribed a steroid and antibiotic that could not be prescribed here. Please advise.

## 2022-09-27 NOTE — Patient Instructions (Signed)
Medication Instructions:  Your physician recommends that you continue on your current medications as directed. Please refer to the Current Medication list given to you today.  *If you need a refill on your cardiac medications before your next appointment, please call your pharmacy*   Lab Work: NONE   If you have labs (blood work) drawn today and your tests are completely normal, you will receive your results only by: [place] (if you have MyChart) OR A paper copy in the mail If you have any lab test that is abnormal or we need to change your treatment, we will call you to review the results.   Testing/Procedures: NONE    Follow-Up: At [facility], you and your health needs are our priority.  As part of our continuing mission to provide you with exceptional heart care, we have created designated Provider Care Teams.  These Care Teams include your primary Cardiologist (physician) and Advanced Practice Providers (APPs -  Physician Assistants and Nurse Practitioners) who all work together to provide you with the care you need, when you need it.  We recommend signing up for the patient portal called "MyChart".  Sign up information is provided on this After Visit Summary.  MyChart is used to connect with patients for Virtual Visits (Telemedicine).  Patients are able to view lab/test results, encounter notes, upcoming appointments, etc.  Non-urgent messages can be sent to your provider as well.   To learn more about what you can do with MyChart, go to [email or web address].    Your next appointment:   6-8 week(s)  Provider:   Finis Bud, NP    Other Instructions Thank you for choosing [facility]!

## 2022-10-04 ENCOUNTER — Encounter (HOSPITAL_COMMUNITY): Payer: Medicare HMO

## 2022-10-04 ENCOUNTER — Encounter (HOSPITAL_COMMUNITY): Payer: Self-pay

## 2022-10-04 ENCOUNTER — Ambulatory Visit (HOSPITAL_COMMUNITY)
Admission: RE | Admit: 2022-10-04 | Discharge: 2022-10-04 | Disposition: A | Discharge status: Home / Self Care | Payer: Medicare HMO | Source: Ambulatory Visit | Attending: Family Medicine | Admitting: Family Medicine

## 2022-10-04 VITALS — BP 98/68 | HR 84 | Wt 137.0 lb

## 2022-10-04 DIAGNOSIS — I5022 Chronic systolic (congestive) heart failure: Secondary | ICD-10-CM | POA: Diagnosis not present

## 2022-10-04 DIAGNOSIS — R1310 Dysphagia, unspecified: Secondary | ICD-10-CM | POA: Diagnosis not present

## 2022-10-04 DIAGNOSIS — Z9981 Dependence on supplemental oxygen: Secondary | ICD-10-CM | POA: Diagnosis not present

## 2022-10-04 DIAGNOSIS — Z8616 Personal history of COVID-19: Secondary | ICD-10-CM | POA: Insufficient documentation

## 2022-10-04 DIAGNOSIS — E1122 Type 2 diabetes mellitus with diabetic chronic kidney disease: Secondary | ICD-10-CM | POA: Diagnosis not present

## 2022-10-04 DIAGNOSIS — N183 Chronic kidney disease, stage 3 unspecified: Secondary | ICD-10-CM | POA: Diagnosis not present

## 2022-10-04 DIAGNOSIS — I428 Other cardiomyopathies: Secondary | ICD-10-CM | POA: Diagnosis not present

## 2022-10-04 DIAGNOSIS — Z7901 Long term (current) use of anticoagulants: Secondary | ICD-10-CM | POA: Diagnosis not present

## 2022-10-04 DIAGNOSIS — Z86711 Personal history of pulmonary embolism: Secondary | ICD-10-CM | POA: Insufficient documentation

## 2022-10-04 DIAGNOSIS — I130 Hypertensive heart and chronic kidney disease with heart failure and stage 1 through stage 4 chronic kidney disease, or unspecified chronic kidney disease: Secondary | ICD-10-CM | POA: Diagnosis not present

## 2022-10-04 DIAGNOSIS — Z87891 Personal history of nicotine dependence: Secondary | ICD-10-CM | POA: Diagnosis not present

## 2022-10-04 DIAGNOSIS — Z79899 Other long term (current) drug therapy: Secondary | ICD-10-CM | POA: Insufficient documentation

## 2022-10-04 DIAGNOSIS — J449 Chronic obstructive pulmonary disease, unspecified: Secondary | ICD-10-CM | POA: Diagnosis not present

## 2022-10-04 LAB — BASIC METABOLIC PANEL
Anion gap: 7 (ref 5–15)
BUN: 15 mg/dL (ref 8–23)
CO2: 27 mmol/L (ref 22–32)
Calcium: 8.1 mg/dL — ABNORMAL LOW (ref 8.9–10.3)
Chloride: 107 mmol/L (ref 98–111)
Creatinine, Ser: 1.4 mg/dL — ABNORMAL HIGH (ref 0.61–1.24)
GFR, Estimated: 50 mL/min — ABNORMAL LOW (ref >60)
Glucose, Bld: 87 mg/dL (ref 70–99)
Potassium: 3.8 mmol/L (ref 3.5–5.1)
Sodium: 141 mmol/L (ref 135–145)

## 2022-10-04 LAB — BRAIN NATRIURETIC PEPTIDE: B Natriuretic Peptide: 335.2 pg/mL — ABNORMAL HIGH (ref 0.0–100.0)

## 2022-10-04 MED ORDER — TORSEMIDE 20 MG PO TABS: 20.0000 mg | ORAL_TABLET | Freq: Every day | ORAL | 8 refills | Status: DC

## 2022-10-04 MED ORDER — VALSARTAN 40 MG PO TABS: 40.0000 mg | ORAL_TABLET | Freq: Every day | ORAL | 8 refills | Status: DC

## 2022-10-04 NOTE — Progress Notes (Signed)
PCP: Neale Burly, MD Cardiology: Dr Harl Bowie HF Cardiology: Dr. Aundra Dubin  82 y.o. with history of COPD, CKD stage 3, HTN, COVID 09/2020, and chronic systolic CHF  Patient was first found to have CHF in 1/21.  He was admitted at that time with volume overload and dyspnea.  Cath showed no significant coronary disease.  Echo showed EF 25-30%.  Most recent prior to 2017 showed normal EF.  He was admitted in [place] in 8/21 with CHF and PNA.  He was admitted at [facility] in 12/21 with COVID-19 and CHF. He also has emphysema and uses home oxygen. He no longer smokes.  He had a PE in 2021 and is on apixaban.   Admitted the end of February 2022 and again in March 2022 due to COVID pneumonia.   Echo in 12/23 showed EF 20-25%, moderate LV dilation, mild LVH, normal RV.   He was admitted in 1/24 at [facility] with COPD exacerbation and ?PNA.  He was treated with Augmentin.   With low BP, he was taken off Entresto and started on valsartan.   Follow up 2/24, NYHA III and volume overloaded. Torsemide increased to 20 mg daily x 4 days, then 20 my qod. Spiro 12.5 mg daily started.   Seen in ED 09/25/22 with CP. HsT flat, ECG without acute changes. He was given IV diuretics and discharged.  Today he returns for HF follow up with his daughter. Overall feeling poorly, feels weak. He has SOB walking around his house, no significant dyspnea with ADLs. He has dizziness with position changes. He sleeps with 2 pillows. He has chest tightness with walking and eating. Denies palpitations, edema, or PND/Orthopnea. Appetite ok. No fever or chills. Weight at home 143 pounds. Taking all medications. He has home oxygen, wears 2L at night. Daughter lives with him. He has been referred to GI and SLP for dysphagia concerns.  ECG (personally reviewed): SR with 1AVB PR 240 msec   Labs (12/21): K 4.7, creatinine 1.4 Labs (08/13/2020): K 4.2 Creatinine 1.2 Labs (09/01/20): K 4.6 Creatinine 1.4  Labs (10/16/20): K 5.1  Creatinine 1.4  Labs (1/24): K 3.9, creatinine 1.46 Labs (2/24): K 4.2, creatinine 1.54  PMH: 1. HTN 2. Hyperlipidemia 3. PE: 2021 4. COPD: Emphysema.  He is on oxygen at night.  Quit tobacco 40+ years ago.  5. Type 2 diabetes 6. CKD stage 3 7. GERD with esophagitis 8. COVID-19 infection 9. Chronic systolic CHF: Nonischemic cardiomyopathy.  - Echo (2017): EF 55-60% - LHC (1/21): Normal coronaries.  - Echo (1/21): EF 25-30%, mild MR, normal RV - Echo (12/23): EF 20-25%, moderate LV dilation, mild LVH, normal RV.   SH: Lives in [place].  Retired.  Former smoker.  Married.    Family History  Problem Relation Age of Onset   Stroke Mother    Breast cancer Mother    Lung cancer Brother    Colon cancer Neg Hx    ROS: All systems reviewed and negative except as per HPI.   Current Outpatient Medications  Medication Sig Dispense Refill   albuterol (PROVENTIL) (2.5 MG/3ML) 0.083% nebulizer solution Take 3 mLs (2.5 mg total) by nebulization every 4 (four) hours as needed for wheezing or shortness of breath. 75 mL 2   albuterol (VENTOLIN HFA) 108 (90 Base) MCG/ACT inhaler Inhale 1-2 puffs into the lungs every 4 (four) hours as needed for shortness of breath.     atorvastatin (LIPITOR) 40 MG tablet Take 1 tablet by mouth daily.  dapagliflozin propanediol (FARXIGA) 10 MG TABS tablet Take 1 tablet (10 mg total) by mouth daily before breakfast. 100 tablet 3   HM SENNA-S 8.6-50 MG tablet Take 2 tablets by mouth daily.     HYDROcodone-acetaminophen (NORCO) 7.5-325 MG tablet Take 1 tablet by mouth 2 (two) times daily as needed for moderate pain.     metoprolol succinate (TOPROL XL) 25 MG 24 hr tablet Take 1 tablet (25 mg total) by mouth daily. 90 tablet 3   midodrine (PROAMATINE) 2.5 MG tablet Take 1 tablet (2.5 mg total) by mouth 3 (three) times daily as needed (for BP less than 90/60). 30 tablet 1   mirtazapine (REMERON) 15 MG tablet Take 15 mg by mouth daily.     OXYGEN Inhale 3 L  into the lungs daily as needed (for shortness of breath).     pantoprazole (PROTONIX) 40 MG tablet Take 40 mg by mouth 2 (two) times daily.     spironolactone (ALDACTONE) 25 MG tablet Take 0.5 tablets (12.5 mg total) by mouth daily. 45 tablet 3   torsemide (DEMADEX) 20 MG tablet Take 1 tablet (20 mg total) by mouth every other day. 45 tablet 3   valsartan (DIOVAN) 80 MG tablet Take 80 mg by mouth daily.     No current facility-administered medications for this encounter.   BP 98/68   Pulse 84   Wt 62.1 kg (137 lb)   SpO2 93%   BMI 22.11 kg/m   Wt Readings from Last 3 Encounters:  10/04/22 62.1 kg (137 lb)  09/27/22 62.6 kg (138 lb)  09/25/22 60.3 kg (133 lb)   Physical Exam General:  NAD. No resp difficulty, arrived in [facility], frail HEENT: Hoarse Neck: Supple. JVP to jaw. Carotids 2+ bilat; no bruits. No lymphadenopathy or thryomegaly appreciated. Cor: PMI nondisplaced. Regular rate & rhythm. No rubs, gallops or murmurs. Lungs: rhonchi throughout Abdomen: Soft, nontender, nondistended. No hepatosplenomegaly. No bruits or masses. Good bowel sounds. Extremities: No cyanosis, clubbing, rash, edema Neuro: Alert & oriented x 3, cranial nerves grossly intact. Moves all 4 extremities w/o difficulty. Affect pleasant.  Assessment/Plan: 1. Chronic systolic CHF: Nonischemic cardiomyopathy.  LHC in 1/21 with no significant coronary disease.  Echo (1/21) with EF 25-30%, mild MR, normal RV. Echo in 12/23 showed EF 20-25%, moderate LV dilation, mild LVH, normal RV.  Cause of cardiomyopathy is uncertain. NYHA class III symptoms.  He is volume overloaded. GDMT limited by CKD & hypotension. - Increase torsemide to 20 mg daily. BMET and BNP today, repeat BMET in 10-14 days. - Decrease valsartan to 40 mg daily to allow for more diuresis (off Entresto with low BP). - Continue Toprol XL 25 mg daily.  - Continue Farxiga 10 mg daily with CHF and CKD.  - Continue spironolactone 12.5 mg daily.  - Continue  midodrine 2.5 mg tid PRN BP <90/60. 2. History of PE: He remains on Eliquis.  3. CKD: Stage 3, BMET today.  4. COPD: On home oxygen, wears 2L at night. He has been referred to Pulmonary. He is not on any maintenance inhalers. Recent COPD exacerbations. 5. Dysphagia: Concerns for aspiration, he has been referred to GI and SLP 6. GOC: He appears end-stage. Need to discuss Palliative soon.  He has follow up with Gen Cardiology in a few weeks. He lives in [place], will try to cluster appointments. Follow up in 2 months with Dr. Aundra Dubin.   Maricela Bo [facility] FNP-BC 10/04/2022

## 2022-10-04 NOTE — Patient Instructions (Signed)
Thank you for coming in today  Labs were done today, if any labs are abnormal the clinic will call you No news is good news  Your physician recommends that you schedule a follow-up appointment in: Appointment has been scheduled  INCREASE Torsemide to 20 mg daily DECREASE Valsartan to 40 mg daily     Do the following things EVERYDAY: Weigh yourself in the morning before breakfast. Write it down and keep it in a log. Take your medicines as prescribed Eat low salt foods--Limit salt (sodium) to 2000 mg per day.  Stay as active as you can everyday Limit all fluids for the day to less than 2 liters  At the [facility], you and your health needs are our priority. As part of our continuing mission to provide you with exceptional heart care, we have created designated Provider Care Teams. These Care Teams include your primary Cardiologist (physician) and Advanced Practice Providers (APPs- Physician Assistants and Nurse Practitioners) who all work together to provide you with the care you need, when you need it.   You may see any of the following providers on your designated Care Team at your next follow up: Dr Glori Bickers Dr Loralie Champagne Dr. Roxana Hires, NP Lyda Jester, [place] [facility] [place], [place] [name], NP Audry Riles, PharmD   Please be sure to bring in all your medications bottles to every appointment.    Thank you for choosing [facility]   If you have any questions or concerns before your next appointment please send [place] a message through [place] or call our office at [phone].    TO LEAVE A MESSAGE FOR THE NURSE SELECT OPTION 2, PLEASE LEAVE A MESSAGE INCLUDING: YOUR NAME DATE OF BIRTH CALL BACK NUMBER REASON FOR CALL**this is important as we prioritize the call backs  YOU WILL RECEIVE A CALL BACK THE SAME DAY AS LONG AS YOU CALL BEFORE 4:00 PM

## 2022-10-05 ENCOUNTER — Other Ambulatory Visit (HOSPITAL_COMMUNITY): Payer: Self-pay | Admitting: Cardiology

## 2022-10-05 ENCOUNTER — Telehealth (HOSPITAL_COMMUNITY): Payer: Self-pay | Admitting: Cardiology

## 2022-10-05 DIAGNOSIS — I5042 Chronic combined systolic (congestive) and diastolic (congestive) heart failure: Secondary | ICD-10-CM

## 2022-10-05 NOTE — Telephone Encounter (Signed)
-----   Message from Rafael Bihari, [place] sent at 10/04/2022  1:50 PM EST ----- Labs ok.   Repeat  BMET in 10-14 days to follow renal function

## 2022-10-05 NOTE — Telephone Encounter (Signed)
Patient called.  Patient aware via daughter Will have labs repeated at [place] Order placed and released

## 2022-10-13 NOTE — Progress Notes (Deleted)
GI Office Note    Referring Provider: Finis Bud, NP Primary Care Physician:  Neale Burly, MD Primary Gastroenterologist: Elon Alas. Abbey Chatters, DO  Date:  10/13/2022  [name], DOB Sep 22, 1940, MRN [id number]   Chief Complaint   No chief complaint on file.   History of Present Illness  Gregory Mitchell is a 82 y.o. male with a history of heart failure with reduced EF, COPD, chronic respiratory failure, chronic dysphagia, recurrent candidal esophagitis, chronic GERD, constipation, IDA presenting today at the request of PCP for dysphagia.  Last EGD:08/24/21 - Moderately severe candidiasis esophagitis with no bleeding. Cells for cytology obtained. - Gastritis. Biopsied. - Normal duodenal bulb, first portion of the duodenum and second portion of the duodenum.   Flex sig: 08/24/21: - Sacral decubitus ulcer found on perianal exam. - The recto-sigmoid colon, sigmoid colon and descending colon are normal. - No specimens collected.   Colonoscopy March 2008:  -normal,  no specimens  Last office visit 06/02/22. C/o abdominal pain/chest tightness after eating. Note by Dr. Abbey Chatters repots patient had previously wished to undergo EGD and colonoscopy to further evaluate dysphagia and IDA and was awaiting clearance form pulmonology. He reported ongoing dysphagia, coughing, and epigastric discomfort with eating. No melena or brbpr. Had been treated with diflucan multiple times in the past.   Seen while inpatient at [facility] 07/15/22 for chest pain radiating into the upper abdomen and GI consulted for melena and anemia. Hgb was 7.3. denied brbpr. On PPI. Also complained of constipation. Treated with BID PPI.   EGD 07/16/22: -non critical Schatzki ring s/p dilation -intact fundoplication with protruding sutures otherwise normal stomach -normal GE junction -normal duodenum   Today:    Current Outpatient Medications  Medication Sig Dispense Refill   albuterol (PROVENTIL) (2.5 MG/3ML)  0.083% nebulizer solution Take 3 mLs (2.5 mg total) by nebulization every 4 (four) hours as needed for wheezing or shortness of breath. 75 mL 2   albuterol (VENTOLIN HFA) 108 (90 Base) MCG/ACT inhaler Inhale 1-2 puffs into the lungs every 4 (four) hours as needed for shortness of breath.     atorvastatin (LIPITOR) 40 MG tablet Take 1 tablet by mouth daily.     dapagliflozin propanediol (FARXIGA) 10 MG TABS tablet Take 1 tablet (10 mg total) by mouth daily before breakfast. 100 tablet 3   HM SENNA-S 8.6-50 MG tablet Take 2 tablets by mouth daily.     HYDROcodone-acetaminophen (NORCO) 7.5-325 MG tablet Take 1 tablet by mouth 2 (two) times daily as needed for moderate pain.     metoprolol succinate (TOPROL XL) 25 MG 24 hr tablet Take 1 tablet (25 mg total) by mouth daily. 90 tablet 3   midodrine (PROAMATINE) 2.5 MG tablet Take 1 tablet (2.5 mg total) by mouth 3 (three) times daily as needed (for BP less than 90/60). 30 tablet 1   mirtazapine (REMERON) 15 MG tablet Take 15 mg by mouth daily.     OXYGEN Inhale 3 L into the lungs daily as needed (for shortness of breath).     pantoprazole (PROTONIX) 40 MG tablet Take 40 mg by mouth 2 (two) times daily.     spironolactone (ALDACTONE) 25 MG tablet Take 0.5 tablets (12.5 mg total) by mouth daily. 45 tablet 3   torsemide (DEMADEX) 20 MG tablet Take 1 tablet (20 mg total) by mouth daily. 30 tablet 8   valsartan (DIOVAN) 40 MG tablet Take 1 tablet (40 mg total) by mouth daily. 30 tablet  8   No current facility-administered medications for this visit.    Past Medical History:  Diagnosis Date   Asthma    Chronic renal insufficiency    COPD (chronic obstructive pulmonary disease) (HCC)    Heart failure ([place])    a. EF 25-30% in 08/2019 with cath showing normal cors b. EF 20-25% in 09/2020 c. 30-35% in 02/2021 d. EF 20-25% in 10/2021   Hypertension    Pulmonary emboli ([place]) 03/2019   Renal insufficiency    Type 2 diabetes mellitus ([place])     Past  Surgical History:  Procedure Laterality Date   APPENDECTOMY     BALLOON DILATION N/A 03/18/2020   Procedure: BALLOON DILATION;  Surgeon: Eloise Harman, DO;  Location[facility];  Service: Endoscopy;  Laterality: N/A;   CHOLECYSTECTOMY     COLONOSCOPY  10/2006   Dr.Anwar:normal   ESOPHAGEAL BRUSHING  08/24/2021   Procedure: ESOPHAGEAL BRUSHING;  Surgeon: Eloise Harman, DO;  Location: [facility];  Service: Endoscopy;;   ESOPHAGOGASTRODUODENOSCOPY (EGD) WITH PROPOFOL N/A 03/18/2020   Procedure: ESOPHAGOGASTRODUODENOSCOPY (EGD) WITH PROPOFOL;  Surgeon: Eloise Harman, DO;  [facility];  Service: Endoscopy;  Laterality: N/A;  2:30pm   ESOPHAGOGASTRODUODENOSCOPY (EGD) WITH PROPOFOL N/A 08/24/2021   Procedure: ESOPHAGOGASTRODUODENOSCOPY (EGD) WITH PROPOFOL;  Surgeon: Eloise Harman, DO;  [facility];  Service: Endoscopy;  Laterality: N/A;  with dilation   ESOPHAGOGASTRODUODENOSCOPY (EGD) WITH PROPOFOL N/A 07/16/2022   Procedure: ESOPHAGOGASTRODUODENOSCOPY (EGD) WITH PROPOFOL;  Surgeon: Daneil Dolin, MD;  [facility];  Service: Endoscopy;  Laterality: N/A;   FLEXIBLE SIGMOIDOSCOPY N/A 08/24/2021   Procedure: FLEXIBLE SIGMOIDOSCOPY;  Surgeon: Eloise Harman, DO;  [facility];  Service: Endoscopy;  Laterality: N/A;   MALONEY DILATION  07/16/2022   Procedure: Venia Minks DILATION;  Surgeon: Daneil Dolin, MD;  Location: [facility];  Service: Endoscopy;;   NASAL SINUS SURGERY     NEPHRECTOMY     RIGHT (Question CA.  No further therapy needed.)   RIGHT/LEFT HEART CATH AND CORONARY ANGIOGRAPHY N/A 09/03/2019   Procedure: RIGHT/LEFT HEART CATH AND CORONARY ANGIOGRAPHY;  Surgeon: Lorretta Harp, MD;  Location: [place] [facility];  Service: Cardiovascular;  Laterality: N/A;    Family History  Problem Relation Age of Onset   Stroke Mother    Breast cancer Mother    Lung cancer Brother    Colon cancer Neg Hx     Allergies as of  10/14/2022   (No Known Allergies)    Social History   Socioeconomic History   Marital status: Widowed    Spouse name: Not on file   Number of children: Not on file   Years of education: Not on file   Highest education level: Not on file  Occupational History   Not on file  Tobacco Use   Smoking status: Former    Packs/day: 1.00    Years: 15.00    Total pack years: 15.00    Types: Cigarettes    Start date: 07/14/1957    Quit date: 08/09/1976    Years since quitting: 46.2   Smokeless tobacco: Former    Types: Chew    Quit date: 02/10/2006   Tobacco comments:    chewed tobacco for 10 years  Vaping Use   Vaping Use: Never used  Substance and Sexual Activity   Alcohol use: Not Currently    Alcohol/week: 0.0 standard drinks of alcohol    Comment:  no etoh since he was in his 42s   Drug use: Never   Sexual activity: Yes  Other Topics Concern   Not on file  Social History Narrative   Lives with wife.  Three children.     Social Determinants of Health   Financial Resource Strain: Not on file  Food Insecurity: No Food Insecurity (08/23/2022)   Hunger Vital Sign    Worried About Running Out of Food in the Last Year: Never true    Ran Out of Food in the Last Year: Never true  Transportation Needs: No Transportation Needs (08/23/2022)   PRAPARE - Hydrologist (Medical): No    Lack of Transportation (Non-Medical): No  Physical Activity: Not on file  Stress: Not on file  Social Connections: Not on file     Review of Systems   Gen: Denies fever, chills, anorexia. Denies fatigue, weakness, weight loss.  CV: Denies chest pain, palpitations, syncope, peripheral edema, and claudication. Resp: Denies dyspnea at rest, cough, wheezing, coughing up blood, and pleurisy. GI: See HPI Derm: Denies rash, itching, dry skin Psych: Denies depression, anxiety, memory loss, confusion. No homicidal or suicidal ideation.  Heme: Denies bruising, bleeding, and enlarged  lymph nodes.   Physical Exam   There were no vitals taken for this visit.  General:   Alert and oriented. No distress noted. Pleasant and cooperative.  Head:  Normocephalic and atraumatic. Eyes:  Conjuctiva clear without scleral icterus. Mouth:  Oral mucosa pink and moist. Good dentition. No lesions. Lungs:  Clear to auscultation bilaterally. No wheezes, rales, or rhonchi. No distress.  Heart:  S1, S2 present without murmurs appreciated.  Abdomen:  +BS, soft, non-tender and non-distended. No rebound or guarding. No HSM or masses noted. Rectal: *** Msk:  Symmetrical without gross deformities. Normal posture. Extremities:  Without edema. Neurologic:  Alert and  oriented x4 Psych:  Alert and cooperative. Normal mood and affect.   Assessment  Gregory Mitchell is a 82 y.o. male with a history of heart failure with reduced EF, COPD, chronic respiratory failure, chronic dysphagia, recurrent candidal esophagitis, chronic GERD, constipation, IDA presenting today at the request of PCP for dysphagia.  Anemia:   GERD, Dysphagia, recurrent candidal esophagitis:    Constipation:   PLAN   *** TCS if able to obtain clearance from pulmonology. ? Continue PPI BID Continue senna daily.     Venetia Night, MSN, FNP-BC, AGACNP-BC [facility]

## 2022-10-14 ENCOUNTER — Encounter: Payer: Self-pay | Admitting: Gastroenterology

## 2022-10-14 ENCOUNTER — Ambulatory Visit: Payer: Medicare HMO | Admitting: Gastroenterology

## 2022-10-20 ENCOUNTER — Ambulatory Visit: Payer: Medicare HMO | Admitting: Internal Medicine

## 2022-10-20 ENCOUNTER — Ambulatory Visit (HOSPITAL_BASED_OUTPATIENT_CLINIC_OR_DEPARTMENT_OTHER): Payer: Medicare HMO | Admitting: Pulmonary Disease

## 2022-10-20 ENCOUNTER — Other Ambulatory Visit: Payer: Self-pay | Admitting: Nurse Practitioner

## 2022-10-20 NOTE — Progress Notes (Deleted)
Gregory Mitchell, male    DOB: 27-Sep-1940    MRN: [id number]   Brief patient profile:  23   yobm  quit smoking 1978 no apparent  resp sequelae referred to pulmonary clinic in [facility]  01/28/2022 for copd eval  by Bernerd Pho PA following in cards clinic for severe chf     Prior pulmonary eval 2015 c/o doe x 2 y with GOLD 3 criteria at that point with hrct 11/16/13 with bronchiectasis ? MAI   Rx 02 around 2021  ? After covid ?   History of Present Illness  01/28/2022  Pulmonary/ 1st office eval/ Gregory Mitchell / [facility] / trelegy maint Chief Complaint  Patient presents with   Consult    Ref by cardiology for copd  does have O2 at home he uses prn   Dyspnea:  maybe 100 ft at grocery handicap parking  Cough: x  years worse x one year p covid 19 in 05/2020 along with 02 need assoc with hoarsness / min  white mucus production worse in ams Sleep:on side flat bed 2 pillows  SABA use: not really helping  02 :  3lpm hs and prn but not titrating  Rec Stop trelegy and the ventolin / albuterol  Pantoprazole 40 mg Take 30- 60 min before your first and last meals of the day  Take duoneb  four times daily  Add budesonide 0.25 mg twice daily to the nebulizer  Prednisone 10 mg take  4 each am x 2 days,   2 each am x 2 days,  1 each am x 2 days and stop     Allergy screen 01/28/22  >  Eos 0.2 /  IgE  / ESR 41.  Wear 02 so you keep your 02 saturation s above 90% at all time  For cough > mucinex 1200 mg every 12 hours and Use the flutter valve as much possible Please schedule a follow up office visit in 4 weeks, sooner if needed  with all medications /inhalers/ solutions     02/24/2022  f/u ov/Gregory Mitchell office/Gregory Mitchell re: copd/uacs  maint on nebs but confused on which ones when / also still on entresto   Chief Complaint  Patient presents with   Follow-up    Feels breathing is getting worse.   Dyspnea:  [employer] leaning on cart/ s 02  Cough: yellowish better p levaquin then worse again   Sleeping: on bed is level/flat with 2 pillows  SABA use: not much hfa 02: 4lpm 24/7  Covid status: vax x 2  Rec Protonix 40 mg Take 30- 60 min before your first and last meals of the day  - new prescription today  Prednisone Take 4 for three days 3 for three days 2 for three days 1 for three days and stop  Levaquin 500 mg daily  x 7 days  Plan A = Automatic = Always=    Aformoterol 15 mcg 1st thing in am and 12 hours and budsonide  0.5  Plan B = Backup (to supplement plan A, not to replace it) Only use your albuterol nebulizer  as a rescue medication Stop fish oil  GERD diet     Admit date: 02/26/2022 Discharge date: 03/02/2022  Discharge to: Home with home health to follow-up with pulmonology and cardiology as well as PCP Discharge Diagnoses:  Pneumonia of left lower lobe due to infectious organism COPD (chronic obstructive pulmonary disease) (CMS-HCC) Heart failure with reduced ejection fraction (CMS-HCC) History of pulmonary embolus (  PE) Diabetes mellitus type 2, noninsulin dependent (CMS-HCC) Dysphagia Resolved Problems: Shortness of breath TIA (transient ischemic attack) Atypical chest pain   Procedures: CT head 7/22 CT abdomen and pelvis, chest 7/22 Chest x-ray 7/22 MRI brain 7/24 V/Q scan 7/24 Venous duplex bilateral lower extremities 7/24       03/08/2022  f/u ov/[place] office/Bruk Tumolo re: GOLD 3  maint on formoterol  but thoroughly confused with meds/ instructoins from [facility] Complaint  Patient presents with   Follow-up    Patient feels he is about the same since last visit 2 weeks ago  Dyspnea:  walked in with cane x 50 fts  Cough: yellow mucus looks the same on / off levaquin Sleeping: 4lpm and flat bed/ 2 pillows  SABA use: confused  02: 4lpm hs and prn  Covid status: vax x 3  Rec Protonix (pantoprazole) 40 mg Take 30- 60 min before your first and last meals of the day  - new prescription today  Plan A = Automatic = Always=    Aformoterol 15  mcg 1st thing in am and 12 hours and budsonide 0.25 twice daily  Plan B = Backup (to supplement plan A, not to replace it) Only use your albuterol nebulizer  as a rescue medication  GERD rx Stop entresto  Start valsartan  For cough > mucinex dm 1200 mg every 12 hours and use flutter valve as much as possible   Please schedule a follow up office visit in 4 weeks, sooner if needed  with all medications /inhalers/ solutions/ flutter valve in hand       Discharge Date:  04/16/2022  1)Please talk to your cardiologist about restarting SACUBITRIL-VALSARTAN 24-26 MG PO TABS/Entresto.  Which is a medication for heart failure.. And possibly restarting apixaban/Eliquis which is a blood thinner 2)Avoid ibuprofen/Advil/Aleve/Motrin/Goody Powders/Naproxen/BC powders/Meloxicam/Diclofenac/Indomethacin and other Nonsteroidal anti-inflammatory medications as these will make you more likely to bleed and can cause stomach ulcers, can also cause Kidney problems.  3)You need oxygen at home at  3 to 4 L via nasal cannula continuously while awake and while asleep--- smoking or having open fires around oxygen can cause fire, significant injury and death 4)Repeat CBC and BMP in about a week or so   5)Very low-salt diet advised--- very low-sodium diet advised   6)Weigh yourself daily, call if you gain more than 3 pounds in 1 day or more than 5 pounds in 1 week as your diuretic medications may need to be adjusted     Admission Diagnosis  COPD exacerbation ([place]) [J44.1] Acute respiratory failure with hypoxia ([place]) [J96.01] PNA (pneumonia) [J18.9]     Discharge Diagnosis  COPD exacerbation ([place]) [J44.1] Acute respiratory failure with hypoxia (HCC) [J96.01] PNA (pneumonia) [J18.9]     Principal Problem:   PNA (pneumonia)   Type 2 diabetes mellitus ([place])   Essential hypertension   COPD GOLD 3 / obstructive bronchiectasis by HRCT in 2015    Chronic kidney disease, stage 3a ([place])   Chronic combined systolic and  diastolic CHF (congestive heart failure) ([place])   Pulmonary embolism (HCC)   Chronic respiratory failure with hypoxia ([place])   Speech abnormality   04/22/2022  post hosp x 2 f/u ov/[place] office/Glenetta Kiger re: GOLD 3  maint on confused again re nebs/inhalers - uses bubble pack for pills and did bring the receipts for those   Chief Complaint  Patient presents with   Follow-up    He has improved some since last OV.   Dyspnea:  food  lion walking pushing cart s 02  Cough: some better off entresto  Sleeping: flat bed 2 pillows ok  SABA use: twice a daily inhaler/ also neb but not sure what  02: 3lpm 24/7  Rec Continue budesonide with the other nebulizer solution twice daily but call me with the name Work on inhaler technique: Make sure you check your oxygen saturation  AT  your highest level of activity (not after you stop)   to be sure it stays over 90%        05/06/2022  f/u ov/[place] office/Julias Mould re: GOLD 3 copd  maint on performist/ budesonide   Chief Complaint  Patient presents with   Follow-up    Breathing is not doing well today   Dyspnea:  did [employer] one week prior to OV  /  50 ft to mailbox s 02 Cough: no  Sleeping: flat bed 2pillows  SABA use: last used weeks  02: 3lpm hs and prn daytime  Covid status: vax x 2 or 3  Nose drips a lot / nothing purulent  Rec My office will be contacting you by phone for referral to ENT   - if you don't hear back from my office within one week please call us back or notify us thru MyChart and we'll address it right away.  Use purse lip breathing when you wheeze For cough > mucinex dm 1200 mg every 12 hours and use flutter valve as much as possible   Plan A = Automatic = Always=    Aformoterol/bud 1st thing in am and 12 hours later  Plan B = Backup (to supplement plan A, not to replace it) Only use your ipatropium/albuterol inhaler as a rescue medication  If worse > Prednisone 10 mg take  4 each am x 2 days,   2 each am x 2 days,  1 each  am x 2 days and stop   Please schedule a follow up visit in 2 months but call sooner if needed      07/14/2022  f/u ov/[place] office/Eddy Liszewski re: f/u  maint on albuterol / bud  Chief Complaint  Patient presents with   Follow-up    Chest pain that is radiating to his stomach. States this started today and wanted to f/u with Dr. Melvyn Novas before going to ER   Onset of cp x 5 days worse with walking and with deep breath -  better supine no assoc   no nausea or vomiting  Dyspnea:  more sob with onset / not using gerd rx bid  Cough: none  Sleeping: flat bed with 2 pillows  SABA use: neb helps  02: 3lpm hs and prn  Last pred several weeks ago helped breathing / no real flare of cough since  Rec Go directly to ER  > bilateral pna and chf  Bring in all inhalers and prednisone prescriptions to be sure you have what you need8    10/20/2022  ACUTE ov/[place] office/Tracia Lacomb re: *** maint on ***  No chief complaint on file.   Dyspnea:  *** Cough: *** Sleeping: *** SABA use: *** 02: *** Covid status: *** Lung cancer screening: ***   No obvious day to day or daytime variability or assoc excess/ purulent sputum or mucus plugs or hemoptysis or cp or chest tightness, subjective wheeze or overt sinus or hb symptoms.   *** without nocturnal  or early am exacerbation  of respiratory  c/o's or need for noct saba. Also denies any obvious fluctuation of  symptoms with weather or environmental changes or other aggravating or alleviating factors except as outlined above   No unusual exposure hx or h/o childhood pna/ asthma or knowledge of premature birth.  Current Allergies, Complete Past Medical History, Past Surgical History, Family History, and Social History were reviewed in [employer] record.  ROS  The following are not active complaints unless bolded Hoarseness, sore throat, dysphagia, dental problems, itching, sneezing,  nasal congestion or discharge of excess mucus or  purulent secretions, ear ache,   fever, chills, sweats, unintended wt loss or wt gain, classically pleuritic or exertional cp,  orthopnea pnd or arm/hand swelling  or leg swelling, presyncope, palpitations, abdominal pain, anorexia, nausea, vomiting, diarrhea  or change in bowel habits or change in bladder habits, change in stools or change in urine, dysuria, hematuria,  rash, arthralgias, visual complaints, headache, numbness, weakness or ataxia or problems with walking or coordination,  change in mood or  memory.        No outpatient medications have been marked as taking for the 10/20/22 encounter (Appointment) with Tanda Rockers, MD.                Past Medical History:  Diagnosis Date   Asthma    Chronic renal insufficiency    COPD (chronic obstructive pulmonary disease) ([place])    Heart failure ([place])    a. EF 25-30% in 08/2019 with cath showing normal cors b. EF 20-25% in 09/2020 c. 30-35% in 02/2021 d. EF 20-25% in 10/2021   Hypertension    Pulmonary emboli ([place]) 03/2019   Renal insufficiency    Type 2 diabetes mellitus (HCC)        Objective:    Wts  07/14/2022       134   05/06/2022       139 04/22/2022       144  03/08/2022       148   02/24/22 151 lb 3.2 oz (68.6 kg)  01/28/22 148 lb (67.1 kg)  12/18/21 149 lb (67.6 kg)      Mild barr***             Assessment

## 2022-11-09 ENCOUNTER — Telehealth: Payer: Self-pay | Admitting: Nurse Practitioner

## 2022-11-09 ENCOUNTER — Encounter: Payer: Self-pay | Admitting: Nurse Practitioner

## 2022-11-09 ENCOUNTER — Ambulatory Visit: Payer: Medicare HMO | Attending: Nurse Practitioner | Admitting: Nurse Practitioner

## 2022-11-09 VITALS — BP 92/58 | HR 110

## 2022-11-09 DIAGNOSIS — R1310 Dysphagia, unspecified: Secondary | ICD-10-CM

## 2022-11-09 DIAGNOSIS — J449 Chronic obstructive pulmonary disease, unspecified: Secondary | ICD-10-CM

## 2022-11-09 DIAGNOSIS — I5022 Chronic systolic (congestive) heart failure: Secondary | ICD-10-CM | POA: Diagnosis not present

## 2022-11-09 DIAGNOSIS — J479 Bronchiectasis, uncomplicated: Secondary | ICD-10-CM

## 2022-11-09 DIAGNOSIS — R079 Chest pain, unspecified: Secondary | ICD-10-CM

## 2022-11-09 DIAGNOSIS — Z79899 Other long term (current) drug therapy: Secondary | ICD-10-CM | POA: Diagnosis not present

## 2022-11-09 DIAGNOSIS — I10 Essential (primary) hypertension: Secondary | ICD-10-CM

## 2022-11-09 DIAGNOSIS — R0602 Shortness of breath: Secondary | ICD-10-CM | POA: Diagnosis not present

## 2022-11-09 DIAGNOSIS — Z789 Other specified health status: Secondary | ICD-10-CM

## 2022-11-09 MED ORDER — MIDODRINE HCL 2.5 MG PO TABS: ORAL_TABLET | ORAL | 1 refills | Status: DC

## 2022-11-09 MED ORDER — TORSEMIDE 20 MG PO TABS: 20.0000 mg | ORAL_TABLET | Freq: Every day | ORAL | 1 refills | Status: DC

## 2022-11-09 NOTE — Progress Notes (Unsigned)
Cardiology Office Note:    Date:  11/09/2022  [name], DOB [birth year], MRN [id number]  PCP:  Gregory Burly, MD   [facility] Cardiologist:  Gregory Dolly, MD     Referring MD: Gregory Burly, MD   CC: Here for follow-up  History of Present Illness:    Gregory Mitchell is a 82 y.o. male with a hx of the following:  Chronic systolic CHF, leg edema Hypertension Hyperlipidemia History of PE History of chest pain COPD, wears 3 L of O2 at night Former smoker Hx of epigastric pain and melena   Previous CV history includes reduced ejection fraction with improvement in 2014.  History of cardiac catheterization in 2013 that showed nonobstructive CAD.  TTE in 2017 showed EF 55 to 60%, grade 1 DD.  Admitted in 2021 with volume overload. TTE revealed EF 25 to 30%, grade 2 DD, mild MR and normal RV. LHC revealed normal coronary arteries, mean PA 17, PCWP 17, LVEDP 13, and CI 3.  Diagnosed with bilateral PEs during admission in 2020, appears unprovoked.  No prior history of blood clots.  TTE in 2022 showed EF 20 to 25%, grade 3 DD, normal RV.  Has been followed in CHF clinic though has difficulty with transportation.  Echo 02/2021 showed EF 30 to 35%, mild RV dysfunction.  CHF clinic recommended cardiac MRI.  Last seen by Dr. Carlyle Mitchell on March 23, 2022.  He was taking torsemide as needed.  Due to his ongoing edema, was advised to take torsemide for the next 3 days and then to resume as needed.  Entresto was changed to valsartan.  Because his blood pressures were soft, was not on Aldactone at the time.    Has had a long history of atypical symptoms of chest pain.  Described as a tightness mid chest, can occur at rest or with activity, 10 out of 10 in severity, not positional, worse with eating, better with fluid pill and with nebulizer, unable to determine how long it lasted in duration. Was pending GI endoscopic.   Admitted in 07/2022 with abdominal pain  and concern for GI bleed with melena.  EGD on July 16, 2022 with findings of Schatzki's ring s/p dilation, intact dundoplication with protruding sutures, otherwise normal-appearing stomach.  Was placed on clear liquid diet with plan to advance to advance diet as tolerated and discharge home.   Our office was contacted in December, and Dr. Harl Mitchell spoke with pulmonologist who felt that it was best to go from [facility] back to [facility].    First saw him for follow-up. Was not doing well. CC was weakness, SHOB, feet/ankle swelling, and similar, ongoing chest pain. Underwent endoscopy and had esophageal dilatation performed.  Daughter presents concern that patient's systolic blood pressure at home is low (< 100), rechecks this to make sure. Because of diminished scattered crackles noted to auscultation in office that day, I was concerned patient was showing signs and symptoms of pneumonia and recommended ED evaluation.  Patient declined, discussed risk of not being seen in the ED.  Case was discussed with Dr. Harl Mitchell who recommended continuing valsartan instead of starting Entresto.   Presented to [name] ED on January 14 and was instructed to head to ED based on recent chest x-ray and lab results.  Troponins x 2 were flat.  BNP 564.  BP on arrival 180/54.  Creatinine 1.59 baseline creatinine at 1.0-1.2.  CXR showed chronic patchy consolidation, reticulation and bronchiectasis  at left greater than right lung base with slightly worse than left lung base consolidation, suggesting worsening of chronic/recurrent infection such as due to recurrent aspiration.  Treated with IV antibiotics.  Tested negative for COVID, flu, RSV and respiratory panel.  Was discharged on Augmentin and 5 days of prednisone for COPD exacerbation.  A few days later after discharge, presented to [facility] for COPD exacerbation.  12-lead EKG showed sinus rhythm with first-degree AV block, RBBB, T wave abnormality.  CXR showed progressive  left basilar airspace infiltrate in keeping with acute infection/aspiration.  He was prescribed doxycycline, prednisone, Lasix, and Zofran.  Was discharged home to self-care.  Saw Dr. Kirk Mitchell with CHF clinic early Feb 2024. Was volume overloaded and symptomatic. Torsemide increased to 20 mg daily x 4 days, then 20 mg every other day with close follow-up of labs. Started on low dose Aldactone.   Saw pt for follow up and continued to state he was not doing well. CC of weakness and chest pain, similar type of CP as previously. CP occurred around mealtime and noticed with swallowing, ongoing x few months, daily.  Admitted to shortness of breath with his symptoms.  Not staying well hydrated. Reviewed recent CXR results on 08/22/2022 that showed findings consistent with recurrent aspiration. Daughter admitted to occasional low SBP readings as last visit with me. 2 view CXR was negative for anything acute. Pulmonary and GI referrals were made for concern of aspiration. Midodrine was added to medication regimen.   Presented to [facility] with CP on 09/25/2022. BP on arrival 87/65. Was having lower extremity weakness, 3/5. Monitor showed A-fib, HR 95. Was given IV fluids. Troponins flat. Resp panel negative. BNP 791. Received IV diuretic. Rest of labs stable. CXR revealed diffuse pulmonary congestion. Told to f/u with outpatient cardiology.   At last visit with me, was still not doing well. No changes since last OV, reported same symptoms. Daughter says midodrine helped BP some. Pt stated he needed and ABX and prednisone. Referred to GI and Pulmonology.   Saw HF clinic 10/04/2022. Reported feeling poor and weak. Was found to be volume overloaded. Torsemide was increased. Valsartan decreased to 40 mg daily. Gregory Bo. [place], NP noted patient to be end stage CHF, recommended to discuss Palliative Care soon.   Today he presents for follow-up. Patient continues to state that he is not doing well. Similar symptoms from last  visit. Daughter states his weakness is getting worse. Unable to get to office visits, hasn't been able to follow-up with GI and Pulmonology. Daughter states BP remains the same, requesting refill on Midodrine. Unable to get weight today, but appears to have lost weight.  Past Medical History:  Diagnosis Date   Asthma    Chronic renal insufficiency    COPD (chronic obstructive pulmonary disease)    Heart failure    a. EF 25-30% in 08/2019 with cath showing normal cors b. EF 20-25% in 09/2020 c. 30-35% in 02/2021 d. EF 20-25% in 10/2021   Hypertension    Pulmonary emboli 03/2019   Renal insufficiency    Type 2 diabetes mellitus     Past Surgical History:  Procedure Laterality Date   APPENDECTOMY     BALLOON DILATION N/A 03/18/2020   Procedure: BALLOON DILATION;  Surgeon: Eloise Harman, DO;  Location[facility];  Service: Endoscopy;  Laterality: N/A;   CHOLECYSTECTOMY     COLONOSCOPY  10/2006   Dr.Anwar:normal   ESOPHAGEAL BRUSHING  08/24/2021   Procedure: ESOPHAGEAL BRUSHING;  Surgeon: Eloise Harman, DO;  Location[facility];  Service: Endoscopy;;   ESOPHAGOGASTRODUODENOSCOPY (EGD) WITH PROPOFOL N/A 03/18/2020   Procedure: ESOPHAGOGASTRODUODENOSCOPY (EGD) WITH PROPOFOL;  Surgeon: Eloise Harman, DO;  [facility];  Service: Endoscopy;  Laterality: N/A;  2:30pm   ESOPHAGOGASTRODUODENOSCOPY (EGD) WITH PROPOFOL N/A 08/24/2021   Procedure: ESOPHAGOGASTRODUODENOSCOPY (EGD) WITH PROPOFOL;  Surgeon: Eloise Harman, DO;  [facility];  Service: Endoscopy;  Laterality: N/A;  with dilation   ESOPHAGOGASTRODUODENOSCOPY (EGD) WITH PROPOFOL N/A 07/16/2022   Procedure: ESOPHAGOGASTRODUODENOSCOPY (EGD) WITH PROPOFOL;  Surgeon: Daneil Dolin, MD;  [facility];  Service: Endoscopy;  Laterality: N/A;   FLEXIBLE SIGMOIDOSCOPY N/A 08/24/2021   Procedure: FLEXIBLE SIGMOIDOSCOPY;  Surgeon: Eloise Harman, DO;  [facility];  Service:  Endoscopy;  Laterality: N/A;   MALONEY DILATION  07/16/2022   Procedure: Venia Minks DILATION;  Surgeon: Daneil Dolin, MD;  Location[facility];  Service: Endoscopy;;   NASAL SINUS SURGERY     NEPHRECTOMY     RIGHT (Question CA.  No further therapy needed.)   RIGHT/LEFT HEART CATH AND CORONARY ANGIOGRAPHY N/A 09/03/2019   Procedure: RIGHT/LEFT HEART CATH AND CORONARY ANGIOGRAPHY;  Surgeon: Lorretta Harp, MD;  Location: [facility];  Service: Cardiovascular;  Laterality: N/A;   Current Medications: Current Meds  Medication Sig   albuterol (PROVENTIL) (2.5 MG/3ML) 0.083% nebulizer solution Take 3 mLs (2.5 mg total) by nebulization every 4 (four) hours as needed for wheezing or shortness of breath.   albuterol (VENTOLIN HFA) 108 (90 Base) MCG/ACT inhaler Inhale 1-2 puffs into the lungs every 4 (four) hours as needed for shortness of breath.   atorvastatin (LIPITOR) 40 MG tablet Take 1 tablet by mouth daily.   dapagliflozin propanediol (FARXIGA) 10 MG TABS tablet Take 1 tablet (10 mg total) by mouth daily before breakfast.   HM SENNA-S 8.6-50 MG tablet Take 2 tablets by mouth daily.   HYDROcodone-acetaminophen (NORCO) 7.5-325 MG tablet Take 1 tablet by mouth 2 (two) times daily as needed for moderate pain.   metoprolol succinate (TOPROL XL) 25 MG 24 hr tablet Take 1 tablet (25 mg total) by mouth daily.   midodrine (PROAMATINE) 2.5 MG tablet TAKE 1 TABLET BY MOUTH THREE TIMES DAILY AS NEEDED (FOR FOR BLOOD PRESSURE LESS THAN 90/60)   mirtazapine (REMERON) 15 MG tablet Take 15 mg by mouth daily.   OXYGEN Inhale 3 L into the lungs daily as needed (for shortness of breath).   pantoprazole (PROTONIX) 40 MG tablet Take 40 mg by mouth 2 (two) times daily.   spironolactone (ALDACTONE) 25 MG tablet Take 0.5 tablets (12.5 mg total) by mouth daily.   torsemide (DEMADEX) 20 MG tablet Take 1 tablet (20 mg total) by mouth daily.   valsartan (DIOVAN) 40 MG tablet Take 1 tablet (40 mg total) by  mouth daily.    Allergies:   Patient has no known allergies.   Social History   Socioeconomic History   Marital status: Widowed    Spouse name: Not on file   Number of children: Not on file   Years of education: Not on file   Highest education level: Not on file  Occupational History   Not on file  Tobacco Use   Smoking status: Former    Packs/day: 1.00    Years: 15.00    Additional pack years: 0.00    Total pack years: 15.00    Types: Cigarettes  Start date: 07/14/1957    Quit date: 08/09/1976    Years since quitting: 46.2    Passive exposure: Never   Smokeless tobacco: Former    Types: Chew    Quit date: 02/10/2006   Tobacco comments:    chewed tobacco for 10 years  Vaping Use   Vaping Use: Never used  Substance and Sexual Activity   Alcohol use: Not Currently    Alcohol/week: 0.0 standard drinks of alcohol    Comment: no etoh since he was in his 20s   Drug use: Never   Sexual activity: Yes  Other Topics Concern   Not on file  Social History Narrative   Lives with wife.  Three children.     Social Determinants of Health   Financial Resource Strain: Not on file  Food Insecurity: No Food Insecurity (08/23/2022)   Hunger Vital Sign    Worried About Running Out of Food in the Last Year: Never true    Ran Out of Food in the Last Year: Never true  Transportation Needs: No Transportation Needs (08/23/2022)   PRAPARE - Hydrologist (Medical): No    Lack of Transportation (Non-Medical): No  Physical Activity: Not on file  Stress: Not on file  Social Connections: Not on file     Family History: The patient's family history includes Breast cancer in his mother; Lung cancer in his brother; Stroke in his mother. There is no history of Colon cancer.  ROS:     Please see the history of present illness.    All other systems reviewed and are negative.  EKGs/Labs/Other Studies Reviewed:    The following studies were reviewed  today:   EKG:  EKG is not ordered today.     1 view CXR on 09/25/2022:  MPRESSION: Diffuse bilateral interstitial pulmonary opacity, consistent with edema or atypical/viral infection. No focal airspace opacity.  2 view CXR on 09/21/2022:  Chronic changes without acute findings.   2 view CXR on 08/22/2022:  IMPRESSION: Chronic patchy consolidation, reticulation and bronchiectasis at the left greater than right lung bases, with slightly worsened left lung base consolidation, suggesting worsening of chronic/recurrent infection such as due to recurrent aspiration.   Echocardiogram on July 15, 2022:  1. Left ventricular ejection fraction, by estimation, is 20 to 25%. The  left ventricle has severely decreased function. The left ventricle  demonstrates global hypokinesis. The left ventricular internal cavity size  was moderately dilated. There is mild  left ventricular hypertrophy. Left ventricular diastolic parameters are  indeterminate.   2. Right ventricular systolic function is normal. The right ventricular  size is normal. There is normal pulmonary artery systolic pressure.   3. Left atrial size was severely dilated.   4. Right atrial size was severely dilated.   5. The mitral valve is normal in structure. No evidence of mitral valve  regurgitation. No evidence of mitral stenosis.   6. The tricuspid valve is abnormal.   7. The aortic valve is tricuspid. Aortic valve regurgitation is not  visualized. No aortic stenosis is present.   8. The inferior vena cava is normal in size with greater than 50%  respiratory variability, suggesting right atrial pressure of 3 mmHg.  Right left heart cath on September 03, 2019: Mr. Semmler has normal coronary arteries and low filling pressures suggesting that he has been adequately or over diuresed. He did have a high V waves suggesting that he potentially has significant mitral regurgitation. His systolic pressures  were in the mid to high 90s  and I therefore gave him a 250 cc bolus of saline given his low LVEDP and wedge. He will need guideline directed optimal medical therapy for his LV dysfunction. The sheath removed and a TR band was placed on the right wrist to achieve patent hemostasis. The patient left lab in stable condition. His renal function be carefully monitored. Dr. Davina Poke was notified of these results.  Myoview on Dec 11, 2014: IMPRESSION: 1. Moderate size region of inferior wall myocardial scar. No ischemic territories seen. Overlying soft tissue attenuation artifact cannot entirely be ruled out.   2. Mild inferior wall hypokinesis.   3. Left ventricular ejection fraction 48%   4.  Low to intermediate-risk stress test findings*.  Recent Labs: 07/15/2022: Magnesium 2.2 09/25/2022: ALT 9; Hemoglobin 7.9; Platelets 298 10/04/2022: B Natriuretic Peptide 335.2; BUN 15; Creatinine, Ser 1.40; Potassium 3.8; Sodium 141  Recent Lipid Panel No results found for: "CHOL", "TRIG", "HDL", "CHOLHDL", "VLDL", "LDLCALC", "LDLDIRECT"   Physical Exam:    VS:  BP (!) 92/58 (BP Location: Right Arm, Patient Position: Sitting, Cuff Size: Normal)   Pulse (!) 110   SpO2 92%     Wt Readings from Last 3 Encounters:  10/04/22 137 lb (62.1 kg)  09/27/22 138 lb (62.6 kg)  09/25/22 133 lb (60.3 kg)     GEN: Thin/frail, cachetic, 82 y.o. male, appears fatigued, ill-appearing, sitting in wheelchair HEENT: Normal NECK: No JVD; No carotid bruits CARDIAC: S1/S2, RRR, no murmurs, rubs, gallops; 2+ pulses RESPIRATORY:  Clear/diminished bilaterally  MUSCULOSKELETAL:  No edema, generalized weakness, No deformity  SKIN: Warm and dry NEUROLOGIC:  Alert and oriented x 3 PSYCHIATRIC:  Flat affect   ASSESSMENT:    1. Chronic systolic heart failure   2. Medication management   3. Chest pain of uncertain etiology   4. SOB (shortness of breath)   5. Chronic obstructive pulmonary disease, unspecified COPD type   6. Bronchiectasis without  complication   7. Essential hypertension, benign   8. Dysphagia, unspecified type   9. Poor prognosis    PLAN:    In order of problems listed above:  Chronic systolic CHF, medication management TTE 07/2022 revealed EF 20-25%, unchanged from 10/2021.  Continue current medication regimen. Cannot uptitrate GDMT at this time. ED precautions discussed. Low sodium diet, fluid restriction <2L, and daily weights encouraged. Educated to contact our office for weight gain of 2 lbs overnight or 5 lbs in one week. Discussed my concerns with patient and daughter that patient's health is declining, concerned about poor prognosis. Discussed goals of care. Discussed/recommended palliative care, pt declines palliative care referral at this time. F/U with HF clinic next week and recommend discussing goals of care at next OV. Will obtain labs. Will refill meds per daughter's request  Chest pain Similar presentation at last OV. Chronic, stable for several months. No current chest pain now. Says symptoms occur around meal time and reviewed previous CXR results which shows findings consistent of aspiration. Previously placed referral for GI and Pulmonology for pt to be seen ASAP, however pt is unable to get to office visits d/t weakness. Cath 2021 showed normal coronary arteries. ED precautions discussed.   Shortness of breath/ COPD/Bronchiectasis  Recurrent since last OV.  Previous chest x-ray revealed chronic patchy consolidation, findings suggesting worsening chronic/recurrent infection due to recurrent aspiration.  Symptomatology and signs consistent with recurrent aspiration pneumonia. Previously referred to GI and pulmonary ASAP, however pt has not been able to  go to OV d/t weakness.  Continue current medication regimen.  ED precautions discussed.  HTN BP today 92/58.  Symptomatic. Believe etiology is d/t dehydration/ over diuresed.  SBP < 100 occasionally per daughter's report. Continue midodrine - will refill.  Encouraged adequate hydration. Discussed to monitor BP at home at least 2 hours after medications and sitting for 5-10 minutes. Continue to monitor and log BP at home. Heart healthy diet encouraged.   5. Aspiration/ Dysphagia, recurrent congestion/PNA? Concerned about findings suggestive of aspiration. Previously referred to GI as mentioned above. Recommend a SLP referral for swallowing eval.   6. Poor prognosis Discussed my concerned for patient's declining health status. Reviewed/discussed goals of care and discussed palliative care/hospice care and reviewed difference. Pt declines palliative care referral at this time. Will arrange future telephone visit to discuss/review goals of care with patient and family.   7. Disposition: Follow up with me or [facility] in 2 weeks via telephone visit to discuss goals of care.   Medication Adjustments/Labs and Tests Ordered: Current medicines are reviewed at length with the patient today.  Concerns regarding medicines are outlined above.  Orders Placed This Encounter  Procedures   Brain natriuretic peptide   Basic metabolic panel   Magnesium   Meds ordered this encounter  Medications   torsemide (DEMADEX) 20 MG tablet    Sig: Take 1 tablet (20 mg total) by mouth daily.    Dispense:  90 tablet    Refill:  1    Please cancel all previous orders for current medication. Change in dosage or pill size.   midodrine (PROAMATINE) 2.5 MG tablet    Sig: TAKE 1 TABLET BY MOUTH THREE TIMES DAILY AS NEEDED (FOR FOR BLOOD PRESSURE LESS THAN 90/60)    Dispense:  270 tablet    Refill:  1    This prescription was filled on 10/20/2022. Any refills authorized will be placed on file.    Patient Instructions  Medication Instructions:  Your physician recommends that you continue on your current medications as directed. Please refer to the Current Medication list given to you today.   Labwork: BNP, BMET, MAG (due in 2-3 days @  [facility])  Testing/Procedures: none  Follow-Up:  Your physician recommends that you schedule a follow-up appointment in: 2 weeks  Any Other Special Instructions Will Be Listed Below (If Applicable).  If you need a refill on your cardiac medications before your next appointment, please call your pharmacy. \    Signed, Finis Bud, NP  11/10/2022 [place]

## 2022-11-09 NOTE — Telephone Encounter (Signed)
  Patient Consent for Virtual Visit        Gregory Mitchell has provided verbal consent on 11/09/2022 for a virtual visit (video or telephone).   CONSENT FOR VIRTUAL VISIT FOR:  Gregory Mitchell  By participating in this virtual visit I agree to the following:  I hereby voluntarily request, consent and authorize [facility] and its employed or contracted physicians, physician assistants, nurse practitioners or other licensed health care professionals (the Practitioner), to provide me with telemedicine health care services (the "Services") as deemed necessary by the treating Practitioner. I acknowledge and consent to receive the Services by the Practitioner via telemedicine. I understand that the telemedicine visit will involve communicating with the Practitioner through live audiovisual communication technology and the disclosure of certain medical information by electronic transmission. I acknowledge that I have been given the opportunity to request an in-person assessment or other available alternative prior to the telemedicine visit and am voluntarily participating in the telemedicine visit.  I understand that I have the right to withhold or withdraw my consent to the use of telemedicine in the course of my care at any time, without affecting my right to future care or treatment, and that the Practitioner or I may terminate the telemedicine visit at any time. I understand that I have the right to inspect all information obtained and/or recorded in the course of the telemedicine visit and may receive copies of available information for a reasonable fee.  I understand that some of the potential risks of receiving the Services via telemedicine include:  Delay or interruption in medical evaluation due to technological equipment failure or disruption; Information transmitted may not be sufficient (e.g. poor resolution of images) to allow for appropriate medical decision making by the Practitioner;  and/or  In rare instances, security protocols could fail, causing a breach of personal health information.  Furthermore, I acknowledge that it is my responsibility to provide information about my medical history, conditions and care that is complete and accurate to the best of my ability. I acknowledge that Practitioner's advice, recommendations, and/or decision may be based on factors not within their control, such as incomplete or inaccurate data provided by me or distortions of diagnostic images or specimens that may result from electronic transmissions. I understand that the practice of medicine is not an exact science and that Practitioner makes no warranties or guarantees regarding treatment outcomes. I acknowledge that a copy of this consent can be made available to me via my patient portal ([facility]), or I can request a printed copy by calling the office of [facility].    I understand that my insurance will be billed for this visit.   I have read or had this consent read to me. I understand the contents of this consent, which adequately explains the benefits and risks of the Services being provided via telemedicine.  I have been provided ample opportunity to ask questions regarding this consent and the Services and have had my questions answered to my satisfaction. I give my informed consent for the services to be provided through the use of telemedicine in my medical care

## 2022-11-09 NOTE — Patient Instructions (Addendum)
Medication Instructions:  Your physician recommends that you continue on your current medications as directed. Please refer to the Current Medication list given to you today.   Labwork: BNP, BMET, MAG (due in 2-3 days @ [facility])  Testing/Procedures: none  Follow-Up:  Your physician recommends that you schedule a follow-up appointment in: 2 weeks  Any Other Special Instructions Will Be Listed Below (If Applicable).  If you need a refill on your cardiac medications before your next appointment, please call your pharmacy. \

## 2022-11-15 ENCOUNTER — Encounter (HOSPITAL_COMMUNITY): Payer: Medicare HMO | Admitting: Cardiology

## 2022-11-17 ENCOUNTER — Encounter (HOSPITAL_COMMUNITY): Payer: Self-pay | Admitting: *Deleted

## 2022-11-17 ENCOUNTER — Other Ambulatory Visit: Payer: Self-pay

## 2022-11-17 ENCOUNTER — Emergency Department (HOSPITAL_COMMUNITY): Payer: Medicare HMO

## 2022-11-17 ENCOUNTER — Observation Stay (HOSPITAL_COMMUNITY)
Admission: EM | Admit: 2022-11-17 | Discharge: 2022-11-18 | Disposition: A | Discharge status: Home Health | Payer: Medicare HMO | Attending: Family Medicine | Admitting: Family Medicine

## 2022-11-17 DIAGNOSIS — E1122 Type 2 diabetes mellitus with diabetic chronic kidney disease: Secondary | ICD-10-CM | POA: Insufficient documentation

## 2022-11-17 DIAGNOSIS — N179 Acute kidney failure, unspecified: Principal | ICD-10-CM

## 2022-11-17 DIAGNOSIS — J9611 Chronic respiratory failure with hypoxia: Secondary | ICD-10-CM | POA: Insufficient documentation

## 2022-11-17 DIAGNOSIS — G4700 Insomnia, unspecified: Secondary | ICD-10-CM | POA: Insufficient documentation

## 2022-11-17 DIAGNOSIS — I130 Hypertensive heart and chronic kidney disease with heart failure and stage 1 through stage 4 chronic kidney disease, or unspecified chronic kidney disease: Secondary | ICD-10-CM | POA: Diagnosis not present

## 2022-11-17 DIAGNOSIS — J449 Chronic obstructive pulmonary disease, unspecified: Secondary | ICD-10-CM | POA: Diagnosis not present

## 2022-11-17 DIAGNOSIS — J45909 Unspecified asthma, uncomplicated: Secondary | ICD-10-CM | POA: Diagnosis not present

## 2022-11-17 DIAGNOSIS — R531 Weakness: Secondary | ICD-10-CM | POA: Diagnosis present

## 2022-11-17 DIAGNOSIS — Z79899 Other long term (current) drug therapy: Secondary | ICD-10-CM | POA: Diagnosis not present

## 2022-11-17 DIAGNOSIS — I959 Hypotension, unspecified: Secondary | ICD-10-CM | POA: Diagnosis not present

## 2022-11-17 DIAGNOSIS — J189 Pneumonia, unspecified organism: Secondary | ICD-10-CM | POA: Diagnosis not present

## 2022-11-17 DIAGNOSIS — R0602 Shortness of breath: Secondary | ICD-10-CM | POA: Diagnosis present

## 2022-11-17 DIAGNOSIS — Z1152 Encounter for screening for COVID-19: Secondary | ICD-10-CM | POA: Diagnosis not present

## 2022-11-17 DIAGNOSIS — I5020 Unspecified systolic (congestive) heart failure: Secondary | ICD-10-CM | POA: Insufficient documentation

## 2022-11-17 DIAGNOSIS — N1831 Chronic kidney disease, stage 3a: Secondary | ICD-10-CM | POA: Insufficient documentation

## 2022-11-17 DIAGNOSIS — Z86711 Personal history of pulmonary embolism: Secondary | ICD-10-CM | POA: Insufficient documentation

## 2022-11-17 DIAGNOSIS — N400 Enlarged prostate without lower urinary tract symptoms: Secondary | ICD-10-CM | POA: Insufficient documentation

## 2022-11-17 DIAGNOSIS — Z87891 Personal history of nicotine dependence: Secondary | ICD-10-CM | POA: Diagnosis not present

## 2022-11-17 LAB — BASIC METABOLIC PANEL
Anion gap: 8 (ref 5–15)
BUN: 44 mg/dL — ABNORMAL HIGH (ref 8–23)
CO2: 27 mmol/L (ref 22–32)
Calcium: 8.3 mg/dL — ABNORMAL LOW (ref 8.9–10.3)
Chloride: 101 mmol/L (ref 98–111)
Creatinine, Ser: 2.11 mg/dL — ABNORMAL HIGH (ref 0.61–1.24)
GFR, Estimated: 31 mL/min — ABNORMAL LOW (ref >60)
Glucose, Bld: 123 mg/dL — ABNORMAL HIGH (ref 70–99)
Potassium: 4.3 mmol/L (ref 3.5–5.1)
Sodium: 136 mmol/L (ref 135–145)

## 2022-11-17 LAB — CBC
HCT: 30.9 % — ABNORMAL LOW (ref 39.0–52.0)
Hemoglobin: 9 g/dL — ABNORMAL LOW (ref 13.0–17.0)
MCH: 25.6 pg — ABNORMAL LOW (ref 26.0–34.0)
MCHC: 29.1 g/dL — ABNORMAL LOW (ref 30.0–36.0)
MCV: 88 fL (ref 80.0–100.0)
Platelets: 314 10*3/uL (ref 150–400)
RBC: 3.51 MIL/uL — ABNORMAL LOW (ref 4.22–5.81)
RDW: 15.7 % — ABNORMAL HIGH (ref 11.5–15.5)
WBC: 5.8 10*3/uL (ref 4.0–10.5)
nRBC: 0 % (ref 0.0–0.2)

## 2022-11-17 LAB — RESP PANEL BY RT-PCR (RSV, FLU A&B, COVID)  RVPGX2
Influenza A by PCR: NEGATIVE
Influenza B by PCR: NEGATIVE
Resp Syncytial Virus by PCR: NEGATIVE
SARS Coronavirus 2 by RT PCR: NEGATIVE

## 2022-11-17 LAB — TROPONIN I (HIGH SENSITIVITY)
Troponin I (High Sensitivity): 32 ng/L — ABNORMAL HIGH (ref <18)
Troponin I (High Sensitivity): 25 ng/L — ABNORMAL HIGH (ref <18)

## 2022-11-17 MED ORDER — MIDODRINE HCL 5 MG PO TABS
2.5000 mg | ORAL_TABLET | Freq: Three times a day (TID) | ORAL | Status: DC
  Administered 2022-11-18: 2.5 mg via ORAL
  Filled 2022-11-17: qty 1

## 2022-11-17 MED ORDER — ALBUTEROL SULFATE (2.5 MG/3ML) 0.083% IN NEBU
2.5000 mg | INHALATION_SOLUTION | RESPIRATORY_TRACT | Status: DC | PRN
  Administered 2022-11-17: 2.5 mg via RESPIRATORY_TRACT
  Filled 2022-11-17: qty 3

## 2022-11-17 MED ORDER — ATORVASTATIN CALCIUM 40 MG PO TABS
80.0000 mg | ORAL_TABLET | Freq: Every day | ORAL | Status: DC
  Administered 2022-11-17: 80 mg via ORAL
  Filled 2022-11-17: qty 2

## 2022-11-17 MED ORDER — TAMSULOSIN HCL 0.4 MG PO CAPS
0.8000 mg | ORAL_CAPSULE | Freq: Every day | ORAL | Status: DC
  Administered 2022-11-18: 0.8 mg via ORAL
  Filled 2022-11-17: qty 2

## 2022-11-17 MED ORDER — IPRATROPIUM-ALBUTEROL 0.5-2.5 (3) MG/3ML IN SOLN
3.0000 mL | Freq: Once | RESPIRATORY_TRACT | Status: AC
  Administered 2022-11-17: 3 mL via RESPIRATORY_TRACT
  Filled 2022-11-17: qty 3

## 2022-11-17 MED ORDER — ENOXAPARIN SODIUM 30 MG/0.3ML IJ SOSY
30.0000 mg | PREFILLED_SYRINGE | INTRAMUSCULAR | Status: DC
  Administered 2022-11-17: 30 mg via SUBCUTANEOUS
  Filled 2022-11-17: qty 0.3

## 2022-11-17 MED ORDER — SODIUM CHLORIDE 0.9 % IV SOLN
500.0000 mg | Freq: Once | INTRAVENOUS | Status: AC
  Administered 2022-11-17: 500 mg via INTRAVENOUS
  Filled 2022-11-17: qty 5

## 2022-11-17 MED ORDER — SODIUM CHLORIDE 0.9 % IV BOLUS
500.0000 mL | Freq: Once | INTRAVENOUS | Status: AC
  Administered 2022-11-17: 500 mL via INTRAVENOUS

## 2022-11-17 MED ORDER — ACETAMINOPHEN 650 MG RE SUPP: 650.0000 mg | Freq: Four times a day (QID) | RECTAL | Status: DC | PRN

## 2022-11-17 MED ORDER — IPRATROPIUM-ALBUTEROL 0.5-2.5 (3) MG/3ML IN SOLN
3.0000 mL | Freq: Once | RESPIRATORY_TRACT | Status: DC
  Filled 2022-11-17: qty 3

## 2022-11-17 MED ORDER — FAMOTIDINE 20 MG PO TABS
40.0000 mg | ORAL_TABLET | Freq: Every day | ORAL | Status: DC
  Administered 2022-11-17: 40 mg via ORAL
  Filled 2022-11-17 (×2): qty 2

## 2022-11-17 MED ORDER — SODIUM CHLORIDE 0.9 % IV SOLN
1.0000 g | Freq: Once | INTRAVENOUS | Status: AC
  Administered 2022-11-17: 1 g via INTRAVENOUS
  Filled 2022-11-17: qty 10

## 2022-11-17 MED ORDER — METOPROLOL SUCCINATE ER 25 MG PO TB24
25.0000 mg | ORAL_TABLET | Freq: Every day | ORAL | Status: DC
  Administered 2022-11-18: 25 mg via ORAL
  Filled 2022-11-17: qty 1

## 2022-11-17 MED ORDER — MIRTAZAPINE 15 MG PO TABS
15.0000 mg | ORAL_TABLET | Freq: Every day | ORAL | Status: DC
  Administered 2022-11-18: 15 mg via ORAL
  Filled 2022-11-17: qty 1

## 2022-11-17 MED ORDER — ACETAMINOPHEN 325 MG PO TABS: 650.0000 mg | ORAL_TABLET | Freq: Four times a day (QID) | ORAL | Status: DC | PRN

## 2022-11-17 MED ORDER — PANTOPRAZOLE SODIUM 40 MG PO TBEC: 40.0000 mg | DELAYED_RELEASE_TABLET | Freq: Two times a day (BID) | ORAL | Status: DC

## 2022-11-17 MED ORDER — PANTOPRAZOLE SODIUM 40 MG IV SOLR
40.0000 mg | Freq: Two times a day (BID) | INTRAVENOUS | Status: DC
  Administered 2022-11-17 – 2022-11-18 (×2): 40 mg via INTRAVENOUS
  Filled 2022-11-17 (×2): qty 10

## 2022-11-17 MED ORDER — HYDROCODONE-ACETAMINOPHEN 7.5-325 MG PO TABS
1.0000 | ORAL_TABLET | Freq: Two times a day (BID) | ORAL | Status: DC | PRN
  Administered 2022-11-17: 1 via ORAL
  Filled 2022-11-17: qty 1

## 2022-11-17 MED ORDER — METHYLPREDNISOLONE SODIUM SUCC 125 MG IJ SOLR
125.0000 mg | INTRAMUSCULAR | Status: AC
  Administered 2022-11-17: 125 mg via INTRAVENOUS
  Filled 2022-11-17: qty 2

## 2022-11-17 MED ORDER — PANTOPRAZOLE SODIUM 40 MG IV SOLR
40.0000 mg | Freq: Once | INTRAVENOUS | Status: AC
  Administered 2022-11-17: 40 mg via INTRAVENOUS
  Filled 2022-11-17: qty 10

## 2022-11-17 MED ORDER — SENNOSIDES-DOCUSATE SODIUM 8.6-50 MG PO TABS
2.0000 | ORAL_TABLET | Freq: Every day | ORAL | Status: DC
  Administered 2022-11-17 – 2022-11-18 (×2): 2 via ORAL
  Filled 2022-11-17 (×2): qty 2

## 2022-11-17 NOTE — H&P (Signed)
History and Physical    Gregory Mitchell UJW:[id number] DOB: 11/07/1940 DOA: 11/17/2022  PCP: Toma DeitersHasanaj, Xaje Vaneta Hammontree, MD  Patient coming from: home  I have personally briefly reviewed patient's [facility]  Chief Complaint: weakness, SOB, chest pain  HPI: Gregory Mitchell is Gregory Mitchell 82 y.o. male with medical history significant of systolic heart failure, COPD on 3 L at baseline, HTN, CKD IIIa and multiple other medical issues presenting with weakness, shortness of breath, and chest pain.   He describes his symptoms as starting Gregory Mitchell few days ago.  Notes chest pain described as tightness.  Lasts Gregory Mitchell few minutes at Gregory Mitchell time.  Worse with swallowing.  His daughter elaborates he complains of daily chest pain probably for the past year.  Notes swallowing makes it worse.  Patient also complains of SOB over Gregory Mitchell similar time period, he's no longer short of breath at the time of my interview.  He's noted generalized weakness as well.  His daughter adds that they've had issues with hypotension, SBP noted to be in the 60's, diastolic in the 30's.  Typically walks with Gregory Mitchell cane.  Denies fevers, chills, nausea, vomiting,   ED Course: labs, imaging, duonebs, solumedrol, PPI, abx, IVF.  Admit to hospitalist.    Review of Systems: As per HPI otherwise all other systems reviewed and are negative.  Past Medical History:  Diagnosis Date   Asthma    Chronic renal insufficiency    COPD (chronic obstructive pulmonary disease)    Heart failure    Gregory Mitchell. EF 25-30% in 08/2019 with cath showing normal cors b. EF 20-25% in 09/2020 c. 30-35% in 02/2021 d. EF 20-25% in 10/2021   Hypertension    Pulmonary emboli 03/2019   Renal insufficiency    Type 2 diabetes mellitus     Past Surgical History:  Procedure Laterality Date   APPENDECTOMY     BALLOON Mitchell N/Jaren Kearn 03/18/2020   Procedure: BALLOON Mitchell;  Surgeon: Gregory Mitchell, Gregory K, DO;  [facility];  Service: Endoscopy;  Laterality: N/Teondra Newburg;   CHOLECYSTECTOMY      COLONOSCOPY  10/2006   Dr.Anwar:normal   ESOPHAGEAL BRUSHING  08/24/2021   Procedure: ESOPHAGEAL BRUSHING;  Surgeon: Gregory Mitchell, Gregory K, DO;  [facility]: [facility];  Service: Endoscopy;;   ESOPHAGOGASTRODUODENOSCOPY (EGD) WITH PROPOFOL N/Gregory Mitchell 03/18/2020   Procedure: ESOPHAGOGASTRODUODENOSCOPY (EGD) WITH PROPOFOL;  Surgeon: Gregory Mitchell, Gregory K, DO;  [facility];  Service: Endoscopy;  Laterality: N/Gregory Mitchell;  2:30pm   ESOPHAGOGASTRODUODENOSCOPY (EGD) WITH PROPOFOL N/Gregory Mitchell 08/24/2021   Procedure: ESOPHAGOGASTRODUODENOSCOPY (EGD) WITH PROPOFOL;  Surgeon: Gregory Mitchell, Gregory K, DO;  [facility];  Service: Endoscopy;  Laterality: N/Teigan Sahli;  with Mitchell   ESOPHAGOGASTRODUODENOSCOPY (EGD) WITH PROPOFOL N/Gregory Mitchell 07/16/2022   Procedure: ESOPHAGOGASTRODUODENOSCOPY (EGD) WITH PROPOFOL;  Surgeon: Gregory Mitchell, Gregory M, MD;  [facility];  Service: Endoscopy;  Laterality: N/Gregory Mitchell;   FLEXIBLE SIGMOIDOSCOPY N/Gregory Mitchell 08/24/2021   Procedure: FLEXIBLE SIGMOIDOSCOPY;  Surgeon: Gregory Mitchell, Gregory K, DO;  [facility];  Service: Endoscopy;  Laterality: N/Gregory Mitchell;   MALONEY Mitchell  07/16/2022   Procedure: Gregory HashimotoMALONEY Mitchell;  Surgeon: Gregory Mitchell, Gregory M, MD;  [facility];  Service: Endoscopy;;   NASAL SINUS SURGERY     NEPHRECTOMY     RIGHT (Question CA.  No further therapy needed.)   RIGHT/LEFT HEART CATH AND CORONARY ANGIOGRAPHY N/Gregory Mitchell 09/03/2019   Procedure: RIGHT/LEFT HEART CATH AND CORONARY ANGIOGRAPHY;  Surgeon: Runell GessBerry, Jonathan J, MD;  Location: [facility];  Service: Cardiovascular;  Laterality: N/Anisha Starliper;    Social History  reports that he quit smoking about 46 years ago. His smoking use included cigarettes. He started smoking about 65 years ago. He has Henriette Hesser 15.00 pack-year smoking history. He has never been exposed to tobacco smoke. He quit smokeless tobacco use about 16 years ago.  His smokeless tobacco use included chew. He reports that he does not currently use alcohol. He reports that he does not use  drugs.  No Known Allergies  Family History  Problem Relation Age of Onset   Stroke Mother    Breast cancer Mother    Lung cancer Brother    Colon cancer Neg Hx     Prior to Admission medications   Medication Sig Start Date End Date Taking? Authorizing Provider  albuterol (PROVENTIL) (2.5 MG/3ML) 0.083% nebulizer solution Take 3 mLs (2.5 mg total) by nebulization every 4 (four) hours as needed for wheezing or shortness of breath. 04/16/22 04/16/23 Yes Gregory Mitchell, Courage, MD  albuterol (VENTOLIN HFA) 108 (90 Base) MCG/ACT inhaler Inhale 1-2 puffs into the lungs every 4 (four) hours as needed for shortness of breath. 07/13/22  Yes [provider]  atorvastatin (LIPITOR) 80 MG tablet Take 80 mg by mouth at bedtime. 11/09/22  Yes [provider]  dapagliflozin propanediol (FARXIGA) 10 MG TABS tablet Take 1 tablet (10 mg total) by mouth daily before breakfast. 02/25/22  Yes Gregory Mitchell, Gregory Pea, MD  famotidine (PEPCID) 40 MG tablet Take 40 mg by mouth daily. 11/09/22  Yes [provider]  guaiFENesin (MUCINEX) 600 MG 12 hr tablet Take 600 mg by mouth 2 (two) times daily.   Yes [provider]  HM SENNA-S 8.6-50 MG tablet Take 2 tablets by mouth daily. 07/10/21  Yes [provider]  HYDROcodone-acetaminophen (NORCO) 7.5-325 MG tablet Take 1 tablet by mouth 2 (two) times daily as needed for moderate pain. 11/27/20  Yes [provider]  metoprolol succinate (TOPROL XL) 25 MG 24 hr tablet Take 1 tablet (25 mg total) by mouth daily. 12/18/21  Yes Gregory Mitchell, Gregory M, PA-C  midodrine (PROAMATINE) 2.5 MG tablet TAKE 1 TABLET BY MOUTH THREE TIMES DAILY AS NEEDED (FOR FOR BLOOD PRESSURE LESS THAN 90/60) 11/09/22  Yes Gregory Dory, NP  mirtazapine (REMERON) 15 MG tablet Take 15 mg by mouth daily. 07/07/22  Yes [provider]  OXYGEN Inhale 3 L into the lungs continuous.   Yes [provider]  pantoprazole (PROTONIX) 40 MG tablet Take 40 mg by mouth 2  (two) times daily.   Yes [provider]  spironolactone (ALDACTONE) 25 MG tablet Take 0.5 tablets (12.5 mg total) by mouth daily. 09/13/22  Yes Laurey Morale, MD  tamsulosin (FLOMAX) 0.4 MG CAPS capsule Take 0.8 mg by mouth daily. 11/09/22  Yes [provider]  torsemide (DEMADEX) 20 MG tablet Take 1 tablet (20 mg total) by mouth daily. 11/09/22  Yes Gregory Dory, NP  valsartan (DIOVAN) 40 MG tablet Take 1 tablet (40 mg total) by mouth daily. 10/04/22  Yes Jacklynn Ganong, [place]    Physical Exam: Vitals:   11/17/22 1136 11/17/22 1500 11/17/22 1530 11/17/22 1812  BP:  109/66 102/65 97/65  Pulse:    87  Resp:  (!) 23 (!) 24   Temp: 99 F (37.2 C)     TempSrc: Oral     SpO2:    100%  Weight:      Height:        Constitutional: NAD, calm,  comfortable Vitals:   11/17/22 1136 11/17/22 1500 11/17/22 1530 11/17/22 1812  BP:  109/66 102/65 97/65  Pulse:    87  Resp:  (!) 23 (!) 24   Temp: 99 F (37.2 C)     TempSrc: Oral     SpO2:    100%  Weight:      Height:       Eyes: PERRL, lids and conjunctivae normal ENMT: Mucous membranes are moist.  Neck: normal, supple Respiratory: unlabor, CTAB Cardiovascular: RRR Abdomen: scaphoid abdomen, nontender Musculoskeletal: no clubbing / cyanosis. No joint deformity upper and lower extremities. Good ROM, no contractures. Normal muscle tone.  Neurologic: CN 2-12 grossly intact.  Psychiatric: Normal judgment and insight. Alert and oriented x 3. Normal mood.   Labs on Admission: I have personally reviewed following labs and imaging studies  CBC: Recent Labs  Lab 11/17/22 1137  WBC 5.8  HGB 9.0*  HCT 30.9*  MCV 88.0  PLT 314    Basic Metabolic Panel: Recent Labs  Lab 11/17/22 1137  NA 136  Mitchell 4.3  CL 101  CO2 27  GLUCOSE 123*  BUN 44*  CREATININE 2.11*  CALCIUM 8.3*    GFR: Estimated Creatinine Clearance: 21.1 mL/min (Sallyanne Birkhead) (by C-G formula based on SCr of 2.11 mg/dL (H)).  Liver Function Tests: No  results for input(s): "AST", "ALT", "ALKPHOS", "BILITOT", "PROT", "ALBUMIN" in the last 168 hours.  Urine analysis:    Component Value Date/Time   COLORURINE YELLOW 05/28/2022 2235   APPEARANCEUR CLEAR 05/28/2022 2235   LABSPEC 1.014 05/28/2022 2235   PHURINE 6.0 05/28/2022 2235   GLUCOSEU NEGATIVE 05/28/2022 2235   HGBUR NEGATIVE 05/28/2022 2235   BILIRUBINUR NEGATIVE 05/28/2022 2235   KETONESUR NEGATIVE 05/28/2022 2235   PROTEINUR NEGATIVE 05/28/2022 2235   NITRITE NEGATIVE 05/28/2022 2235   LEUKOCYTESUR NEGATIVE 05/28/2022 2235    Radiological Exams on Admission: CT Chest Wo Contrast  Result Date: 11/17/2022 CLINICAL DATA:  Chest pain and shortness of breath. Minimal left basilar consolidation or volume loss on chest radiographs earlier today. EXAM: CT CHEST WITHOUT CONTRAST TECHNIQUE: Multidetector CT imaging of the chest was performed following the standard protocol without IV contrast. RADIATION DOSE REDUCTION: This exam was performed according to the departmental dose-optimization program which includes automated exposure control, adjustment of the mA and/or kV according to patient size and/or use of iterative reconstruction technique. COMPARISON:  Chest radiographs earlier today. Chest CTA dated 05/29/2022 FINDINGS: Cardiovascular: Atheromatous calcifications, including the coronary arteries and aorta. Mildly enlarged heart. Diffuse low-density of the blood relative to the arterial walls. Mediastinum/Nodes: Mild-to-moderate diffuse esophageal wall thickening. Unremarkable thyroid gland. No enlarged lymph nodes. Lungs/Pleura: Stable areas of cylindrical bronchiectasis and associated parenchymal densities the right upper lobe, left upper lobe/lingula and both lower lobes. Bilateral multilobar tree-in-bud opacities and micro nodularity are again demonstrated with mild progression in the right lower lobe and right upper lobe. No pleural fluid. Upper Abdomen: Colonic interposition on the  right. Stable surgical clips in the retroperitoneum on the right, status post right nephrectomy. Musculoskeletal: Thoracic and lower cervical spine degenerative changes, including changes of DISH. Fused sternomanubrial joint. IMPRESSION: 1. Bilateral multilobar tree-in-bud opacities and micro nodularity with mild progression in the right lower lobe and right upper lobe. This is most likely due to an atypical infectious process such as Mycobacterium avium complex. 2. Stable areas of cylindrical bronchiectasis and associated parenchymal densities the right upper lobe, left upper lobe/lingula and both lower lobes. 3. Mild-to-moderate diffuse esophageal wall thickening, most  likely due to esophagitis. 4. Calcific coronary artery and aortic atherosclerosis. 5. Diffuse low-density of the blood relative to the arterial walls, compatible with anemia. Aortic Atherosclerosis (ICD10-I70.0). Electronically Signed   By: Beckie Salts Mitchell.D.   On: 11/17/2022 13:38   DG Chest 2 View  Result Date: 11/17/2022 CLINICAL DATA:  Chest pain EXAM: CHEST - 2 VIEW COMPARISON:  09/25/2022 FINDINGS: Enlarged cardiac silhouette. Left base volume loss or minimal consolidation. No pneumothorax or pleural effusion. Normal pulmonary vasculature. Calcified aorta. Thoracic degenerative changes. IMPRESSION: Enlarged cardiac silhouette. Minimal left basilar consolidation or volume loss. Electronically Signed   By: Layla Maw Mitchell.D.   On: 11/17/2022 12:19    EKG: Independently reviewed. Sinus, LAFB and RBBB - appears similar to priors  Assessment/Plan Principal Problem:   AKI (acute kidney injury)    Assessment and Plan:  Shortness of Breath  Chronic Hypoxic Respiratory Failure  COPD Currently on home O2 CT with findings concerning for atypical infectious process like MAC - bronchiectasis and parencymal densities in the RUL, LUL, lingula, and both lower lobes Will follow RVP (negative covid, RSV, flu) S/p steroids and neb in ED,  not wheezing now, hold further steroids for now.  Hold abx for now. Nebulizers prn  Chest Pain  Odynophagia Daughter notes this is Rhythm Gubbels chronic complaint, daily for the past year almost Troponins low and flat, not c/w ACS ? Component of odynophagia? esophagram CT with findings consistent with esophagitis -> PPI BID, follow with GI outpatient  AKI on CKD IIIb Creatinine 2.11 today Follow, hold arb, jardiance, spironolactone Will monitor off IVF for now  Hypotension Midodrine ordered prn, but also has jardiance, spironolactone, valsartan Daughter notes SBP in the 60's at home.  Continue metoprolol for now if tolerated. Follow here, will schedule midodrine rather than prn for now  Systolic Heart Failure EF 20-25% 07/2022 Appears euvolemic Holding jardiance, spironolactone, arb, torsemide.  Continue metoprolol Consider discussion with cardiology  Insomnia Remeron  BPH flomax  Chronic pain Home norco    DVT prophylaxis:   Code Status:   Full, discussed with pt  Family Communication:  Son, daughter over phone  Disposition Plan:   Patient is from:  home  Anticipated DC to:  home  Anticipated DC date:  Tomorrow hopefully  Anticipated DC barriers: Pending improvement  Consults called:  pulmonology  Admission status:  obs  Severity of Illness: The appropriate patient status for this patient is OBSERVATION. Observation status is judged to be reasonable and necessary in order to provide the required intensity of service to ensure the patient's safety. The patient's presenting symptoms, physical exam findings, and initial radiographic and laboratory data in the context of their medical condition is felt to place them at decreased risk for further clinical deterioration. Furthermore, it is anticipated that the patient will be medically stable for discharge from the hospital within 2 midnights of admission.     Lacretia Nicks MD [facility]  How to contact the [facility]  Attending or Consulting provider 7A - 7P or covering provider during after hours 7P -7A, for this patient?   Check the care team in [facility] and look for Gil Ingwersen) attending/consulting [facility] provider listed and b) the [facility] team listed Log into [email or web address] and use [place]'s universal password to access. If you do not have the password, please contact the hospital operator. Locate the [facility] provider you are looking for under [facility] and page to Hailea Eaglin number that you can be directly reached. If you still have difficulty  reaching the provider, please page the [facility] (Director on Call) for the Hospitalists listed on amion for assistance.  11/17/2022, 7:06 PM

## 2022-11-17 NOTE — ED Provider Notes (Signed)
[place] EMERGENCY DEPARTMENT AT [facility] Provider Note   CSN: [id number] Arrival date & time: 11/17/22  1124     History  Chief Complaint  Patient presents with   Chest Pain    Gregory Mitchell is a 82 y.o. male with extensive medical history to include chronic renal insufficiency, type 2 diabetes, cardiomyopathy, chronic systolic and diastolic CHF, COPD no longer smoking, GERD, history of PE no longer anticoagulated, hypertension, asthma.  Patient presents to ED for evaluation of shortness of breath, chest pain and weakness.  Patient reports over the last 1 week he has had centralized nonradiating chest pain associated with shortness of breath.  The patient is unsure if there is an exertional component to this chest pain.  The patient reports that he wears 3 L of oxygen at baseline only at night, denies recently having increased this.  The patient has a history of recurrent pneumonia, has not taken any antibiotics in the last 2 weeks.  The patient reports that he is compliant on his midodrine for his hypotension, Entresto, torsemide diuretic.  The patient son at the bedside reports the patient has had a low-grade fever and complaining of bodyaches and chills for the last 5 days.  Patient also complaining of very productive cough with thick mucus.  Patient denies any recent fevers, nausea, vomiting, abdominal pain.  Patient reports last bowel movement was 2 days ago, reports he does take opioids for chronic pain.   Chest Pain Associated symptoms: cough, shortness of breath and weakness        Home Medications Prior to Admission medications   Medication Sig Start Date End Date Taking? Authorizing Provider  albuterol (PROVENTIL) (2.5 MG/3ML) 0.083% nebulizer solution Take 3 mLs (2.5 mg total) by nebulization every 4 (four) hours as needed for wheezing or shortness of breath. 04/16/22 04/16/23  Shon Hale, MD  albuterol (VENTOLIN HFA) 108 (90 Base) MCG/ACT inhaler Inhale 1-2  puffs into the lungs every 4 (four) hours as needed for shortness of breath. 07/13/22   [provider]  atorvastatin (LIPITOR) 40 MG tablet Take 1 tablet by mouth daily.    [provider]  dapagliflozin propanediol (FARXIGA) 10 MG TABS tablet Take 1 tablet (10 mg total) by mouth daily before breakfast. 02/25/22   Antoine Poche, MD  HM SENNA-S 8.6-50 MG tablet Take 2 tablets by mouth daily. 07/10/21   [provider]  HYDROcodone-acetaminophen (NORCO) 7.5-325 MG tablet Take 1 tablet by mouth 2 (two) times daily as needed for moderate pain. 11/27/20   [provider]  metoprolol succinate (TOPROL XL) 25 MG 24 hr tablet Take 1 tablet (25 mg total) by mouth daily. 12/18/21   Strader, Lennart Pall, PA-C  midodrine (PROAMATINE) 2.5 MG tablet TAKE 1 TABLET BY MOUTH THREE TIMES DAILY AS NEEDED (FOR FOR BLOOD PRESSURE LESS THAN 90/60) 11/09/22   Sharlene Dory, NP  mirtazapine (REMERON) 15 MG tablet Take 15 mg by mouth daily. 07/07/22   [provider]  OXYGEN Inhale 3 L into the lungs daily as needed (for shortness of breath).    [provider]  pantoprazole (PROTONIX) 40 MG tablet Take 40 mg by mouth 2 (two) times daily.    [provider]  spironolactone (ALDACTONE) 25 MG tablet Take 0.5 tablets (12.5 mg total) by mouth daily. 09/13/22   Laurey Morale, MD  torsemide (DEMADEX) 20 MG tablet Take 1 tablet (20 mg total) by mouth daily. 11/09/22   Sharlene Dory, NP  valsartan (DIOVAN) 40 MG tablet Take 1 tablet (40 mg total) by mouth daily. 10/04/22   Jacklynn GanongMilford, Jessica M, FNP      Allergies    Patient has no known allergies.    Review of Systems   Review of Systems  Respiratory:  Positive for cough and shortness of breath.   Cardiovascular:  Positive for chest pain.  Neurological:  Positive for weakness.  All other systems reviewed and are negative.   Physical Exam Updated Vital Signs BP 102/65   Pulse 76   Temp 99 F (37.2 C) (Oral)    Resp (!) 24   Ht 5\' 6"  (1.676 m)   Wt 55.3 kg   SpO2 91%   BMI 19.69 kg/m  Physical Exam Vitals and nursing note reviewed.  Constitutional:      General: He is not in acute distress.    Appearance: Normal appearance. He is not ill-appearing, toxic-appearing or diaphoretic.  HENT:     Head: Normocephalic and atraumatic.     Mouth/Throat:     Mouth: Mucous membranes are moist.     Pharynx: Oropharynx is clear.  Eyes:     Extraocular Movements: Extraocular movements intact.     Conjunctiva/sclera: Conjunctivae normal.     Pupils: Pupils are equal, round, and reactive to light.  Cardiovascular:     Rate and Rhythm: Normal rate and regular rhythm.  Pulmonary:     Effort: Pulmonary effort is normal.     Breath sounds: Wheezing, rhonchi and rales present.  Abdominal:     General: Abdomen is flat.     Tenderness: There is no abdominal tenderness.  Musculoskeletal:     Cervical back: Normal range of motion and neck supple.     Right lower leg: No edema.     Left lower leg: No edema.  Skin:    General: Skin is warm and dry.     Capillary Refill: Capillary refill takes less than 2 seconds.  Neurological:     Mental Status: He is alert and oriented to person, place, and time.     ED Results / Procedures / Treatments   Labs (all labs ordered are listed, but only abnormal results are displayed) Labs Reviewed  BASIC METABOLIC PANEL - Abnormal; Notable for the following components:      Result Value   Glucose, Bld 123 (*)    BUN 44 (*)    Creatinine, Ser 2.11 (*)    Calcium 8.3 (*)    GFR, Estimated 31 (*)    All other components within normal limits  CBC - Abnormal; Notable for the following components:   RBC 3.51 (*)    Hemoglobin 9.0 (*)    HCT 30.9 (*)    MCH 25.6 (*)    MCHC 29.1 (*)    RDW 15.7 (*)    All other components within normal limits  TROPONIN I (HIGH SENSITIVITY) - Abnormal; Notable for the following components:   Troponin I (High Sensitivity) 32 (*)     All other components within normal limits  TROPONIN I (HIGH SENSITIVITY) - Abnormal; Notable for the following components:   Troponin I (High Sensitivity) 25 (*)    All other components within normal limits  RESP PANEL BY RT-PCR (RSV, FLU A&B, COVID)  [id number]    EKG None  Radiology CT Chest Wo Contrast  Result Date: 11/17/2022 CLINICAL DATA:  Chest pain and shortness of breath. Minimal left basilar consolidation or volume loss on chest radiographs earlier today. EXAM:  CT CHEST WITHOUT CONTRAST TECHNIQUE: Multidetector CT imaging of the chest was performed following the standard protocol without IV contrast. RADIATION DOSE REDUCTION: This exam was performed according to the departmental dose-optimization program which includes automated exposure control, adjustment of the mA and/or kV according to patient size and/or use of iterative reconstruction technique. COMPARISON:  Chest radiographs earlier today. Chest CTA dated 05/29/2022 FINDINGS: Cardiovascular: Atheromatous calcifications, including the coronary arteries and aorta. Mildly enlarged heart. Diffuse low-density of the blood relative to the arterial walls. Mediastinum/Nodes: Mild-to-moderate diffuse esophageal wall thickening. Unremarkable thyroid gland. No enlarged lymph nodes. Lungs/Pleura: Stable areas of cylindrical bronchiectasis and associated parenchymal densities the right upper lobe, left upper lobe/lingula and both lower lobes. Bilateral multilobar tree-in-bud opacities and micro nodularity are again demonstrated with mild progression in the right lower lobe and right upper lobe. No pleural fluid. 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 Stable surgical clips in the retroperitoneum on the right, status post right nephrectomy. Musculoskeletal: Thoracic and lower cervical spine degenerative changes, including changes of DISH. Fused sternomanubrial joint. IMPRESSION: 1. Bilateral multilobar tree-in-bud opacities and micro  nodularity with mild progression in the right lower lobe and right upper lobe. This is most likely due to an atypical infectious process such as Mycobacterium avium complex. 2. Stable areas of cylindrical bronchiectasis and associated parenchymal densities the right upper lobe, left upper lobe/lingula and both lower lobes. 3. Mild-to-moderate diffuse esophageal wall thickening, most likely due to esophagitis. 4. Calcific coronary artery and aortic atherosclerosis. 5. Diffuse low-density of the blood relative to the arterial walls, compatible with anemia. Aortic Atherosclerosis (ICD10-I70.0). Electronically Signed   By: Beckie Salts M.D.   On: 11/17/2022 13:38   DG Chest 2 View  Result Date: 11/17/2022 CLINICAL DATA:  Chest pain EXAM: CHEST - 2 VIEW COMPARISON:  09/25/2022 FINDINGS: Enlarged cardiac silhouette. Left base volume loss or minimal consolidation. No pneumothorax or pleural effusion. Normal pulmonary vasculature. Calcified aorta. Thoracic degenerative changes. IMPRESSION: Enlarged cardiac silhouette. Minimal left basilar consolidation or volume loss. Electronically Signed   By: Layla Maw M.D.   On: 11/17/2022 12:19    Procedures Procedures   Medications Ordered in ED Medications  ipratropium-albuterol (DUONEB) 0.5-2.5 (3) MG/3ML nebulizer solution 3 mL (has no administration in time range)  pantoprazole (PROTONIX) injection 40 mg (40 mg Intravenous Given 11/17/22 1219)  ipratropium-albuterol (DUONEB) 0.5-2.5 (3) MG/3ML nebulizer solution 3 mL (3 mLs Nebulization Given 11/17/22 1219)  methylPREDNISolone sodium succinate (SOLU-MEDROL) 125 mg/2 mL injection 125 mg (125 mg Intravenous Given 11/17/22 1219)  sodium chloride 0.9 % bolus 500 mL (0 mLs Intravenous Stopped 11/17/22 1359)  cefTRIAXone (ROCEPHIN) 1 g in sodium chloride 0.9 % 100 mL IVPB (0 g Intravenous Stopped 11/17/22 1449)  azithromycin (ZITHROMAX) 500 mg in sodium chloride 0.9 % 250 mL IVPB (0 mg Intravenous Stopped 11/17/22  1625)    ED Course/ Medical Decision Making/ A&P  Medical Decision Making Amount and/or Complexity of Data Reviewed Labs: ordered. Radiology: ordered.  Risk Prescription drug management.   82 year old male presents to ED for evaluation.  Please see HPI for further details.  On examination patient afebrile and nontachycardic.  Lung sounds have rales, wheezing and rhonchi throughout, the patient is on 3 L of oxygen via nasal cannula.  Abdomen soft and compressible throughout.  Neurological examination at baseline.  No peripheral edema bilaterally.  Patient initially given DuoNeb, 145 Solu-Medrol.  Patient also given 40 mg Protonix for GERD.  CBC without leukocytosis, hemoglobin of 9 which is patient baseline.  BMP with no electrolyte derangement, elevated BUN 44, elevated creatinine at 2.1, decreased GFR 31.  Patient 1 month ago had creatinine of 1.4 this is concerning for AKI.  Patient has history of CHF however does not appear volume overloaded so we will provide patient 500 mL of fluid IV.  Viral panel negative for all.  Patient troponin 32 and 25.  Patient chest x-ray with minimal left basilar consolidation.  CT chest reveals right side.  Patient started on CAP treatment at this time to include ceftriaxone, azithromycin.  Due to patient AKI, Will admit patient.  Discussed patient case with attending hospitalist, Dr. Lowell Guitar, who has agreed to admit the patient.  Patient stable at time of admission.  Patient amenable to plan.  Final Clinical Impression(s) / ED Diagnoses Final diagnoses:  AKI (acute kidney injury)  Community acquired pneumonia, unspecified laterality    Rx / DC Orders ED Discharge Orders     None         Clent Ridges 11/17/22 1659    Gerhard Munch, MD 11/22/22 1000

## 2022-11-17 NOTE — ED Notes (Signed)
Report called  

## 2022-11-17 NOTE — ED Triage Notes (Signed)
Pt in from home c/o mid CP onset since last night, c/o SOB, denies n/v/d, pt reports productive cough with thick sputum, pt reports using O2 at home as needed 3L, pt A&O x4

## 2022-11-18 ENCOUNTER — Observation Stay (HOSPITAL_COMMUNITY): Payer: Medicare HMO

## 2022-11-18 DIAGNOSIS — N179 Acute kidney failure, unspecified: Secondary | ICD-10-CM | POA: Diagnosis not present

## 2022-11-18 LAB — CBC
HCT: 26.9 % — ABNORMAL LOW (ref 39.0–52.0)
Hemoglobin: 7.9 g/dL — ABNORMAL LOW (ref 13.0–17.0)
MCH: 25.9 pg — ABNORMAL LOW (ref 26.0–34.0)
MCHC: 29.4 g/dL — ABNORMAL LOW (ref 30.0–36.0)
MCV: 88.2 fL (ref 80.0–100.0)
Platelets: 312 10*3/uL (ref 150–400)
RBC: 3.05 MIL/uL — ABNORMAL LOW (ref 4.22–5.81)
RDW: 15.7 % — ABNORMAL HIGH (ref 11.5–15.5)
WBC: 4.1 10*3/uL (ref 4.0–10.5)
nRBC: 0 % (ref 0.0–0.2)

## 2022-11-18 LAB — COMPREHENSIVE METABOLIC PANEL
ALT: 12 U/L (ref 0–44)
AST: 15 U/L (ref 15–41)
Albumin: 2.5 g/dL — ABNORMAL LOW (ref 3.5–5.0)
Alkaline Phosphatase: 42 U/L (ref 38–126)
Anion gap: 4 — ABNORMAL LOW (ref 5–15)
BUN: 38 mg/dL — ABNORMAL HIGH (ref 8–23)
CO2: 26 mmol/L (ref 22–32)
Calcium: 8.1 mg/dL — ABNORMAL LOW (ref 8.9–10.3)
Chloride: 105 mmol/L (ref 98–111)
Creatinine, Ser: 1.82 mg/dL — ABNORMAL HIGH (ref 0.61–1.24)
GFR, Estimated: 37 mL/min — ABNORMAL LOW (ref >60)
Glucose, Bld: 117 mg/dL — ABNORMAL HIGH (ref 70–99)
Potassium: 4 mmol/L (ref 3.5–5.1)
Sodium: 135 mmol/L (ref 135–145)
Total Bilirubin: 0.5 mg/dL (ref 0.3–1.2)
Total Protein: 6.4 g/dL — ABNORMAL LOW (ref 6.5–8.1)

## 2022-11-18 MED ORDER — TORSEMIDE 20 MG PO TABS: 20.0000 mg | ORAL_TABLET | Freq: Every day | ORAL | 1 refills | Status: AC | PRN

## 2022-11-18 MED ORDER — MIDODRINE HCL 2.5 MG PO TABS: 2.5000 mg | ORAL_TABLET | Freq: Three times a day (TID) | ORAL | 1 refills | Status: DC

## 2022-11-18 MED ORDER — METOPROLOL SUCCINATE ER 25 MG PO TB24: 12.5000 mg | ORAL_TABLET | Freq: Every day | ORAL | 0 refills | Status: DC

## 2022-11-18 MED ORDER — TORSEMIDE 20 MG PO TABS: 20.0000 mg | ORAL_TABLET | Freq: Every day | ORAL | 1 refills | Status: DC

## 2022-11-18 MED ORDER — DAPAGLIFLOZIN PROPANEDIOL 10 MG PO TABS: 10.0000 mg | ORAL_TABLET | Freq: Every day | ORAL | 3 refills | Status: DC

## 2022-11-18 MED ORDER — FAMOTIDINE 20 MG PO TABS
20.0000 mg | ORAL_TABLET | Freq: Every day | ORAL | Status: DC
  Administered 2022-11-18: 20 mg via ORAL
  Filled 2022-11-18: qty 1

## 2022-11-18 NOTE — TOC Transition Note (Signed)
Transition of Care [facility]) - CM/SW Discharge Note   Patient Details  Name: Gregory Mitchell MRN: [id number] Date of Birth: Nov 12, 1940  [facility] CM/SW Contact:  Villa Herb, LCSWA Phone Number: 11/18/2022, 12:11 PM   Clinical Narrative:    CSW updated that pt is recommended for [facility] PT at D/C. CSW spoke with pts daughter who states they are interested in this. Pt has had HH PT with [facility] in the past and would like to go back. CSW sent referral to Maralyn Sago for review. CSW requested [facility] PT orders. TOC signing off.   Final next level of care: Home w Home Health Services Barriers to Discharge: Continued Medical Work up   Patient Goals and CMS Choice CMS Medicare.gov Compare Post Acute Care list provided to:: Patient Represenative (must comment) Choice offered to / list presented to : Patient, Adult Children  Discharge Placement                         Discharge Plan and Services Additional resources added to the After Visit Summary for                            [facility] Arranged: PT HH Agency: [facility] Center Date [facility] Agency Contacted: 11/18/22      Social Determinants of Health (SDOH) Interventions SDOH Screenings   Food Insecurity: No Food Insecurity (11/17/2022)  Housing: Low Risk  (11/17/2022)  Transportation Needs: No Transportation Needs (11/17/2022)  Utilities: Not At Risk (11/17/2022)  Tobacco Use: Medium Risk (11/17/2022)     Readmission Risk Interventions    08/23/2021    4:24 PM 10/06/2020   11:56 AM  Readmission Risk Prevention Plan  Transportation Screening Complete Complete  PCP or Specialist Appt within 5-7 Days  Complete  PCP or Specialist Appt within 3-5 Days Complete   Home Care Screening  Complete  Medication Review (RN CM)  Complete  HRI or Home Care Consult Complete   Social Work Consult for Recovery Care Planning/Counseling Complete   Palliative Care Screening Not Applicable   Medication  Review Oceanographer) Complete

## 2022-11-18 NOTE — Evaluation (Signed)
Physical Therapy Evaluation Patient Details Name: Gregory Mitchell MRN: [id number] DOB: 07/20/1941 Today's Date: 11/18/2022  History of Present Illness  Gregory Mitchell is a 82 y.o. male with medical history significant of systolic heart failure, COPD on 3 L at baseline, HTN, CKD IIIa and multiple other medical issues presenting with weakness, shortness of breath, and chest pain.      He describes his symptoms as starting a few days ago.  Notes chest pain described as tightness.  Lasts a few minutes at a time.  Worse with swallowing.  His daughter elaborates he complains of daily chest pain probably for the past year.  Notes swallowing makes it worse.  Patient also complains of SOB over a similar time period, he's no longer short of breath at the time of my interview.  He's noted generalized weakness as well.  His daughter adds that they've had issues with hypotension, SBP noted to be in the 60's, diastolic in the 30's.  Typically walks with a cane.  Denies fevers, chills, nausea, vomiting,   Clinical Impression  Patient functioning near baseline for functional mobility and gait demonstrating good return for using SPC during ambulation in room/hallway without loss of balance.  Patient ambulated on room air with SpO2 at 99% and limited mostly due to fatigue.  Plan:  Patient discharged from physical therapy to care of nursing for ambulation daily as tolerated for length of stay.         Recommendations for follow up therapy are one component of a multi-disciplinary discharge planning process, led by the attending physician.  Recommendations may be updated based on patient status, additional functional criteria and insurance authorization.  Follow Up Recommendations       Assistance Recommended at Discharge PRN  Patient can return home with the following  Help with stairs or ramp for entrance;Assistance with cooking/housework;A little help with walking and/or transfers    Equipment Recommendations  None recommended by PT  Recommendations for Other Services       Functional Status Assessment Patient has had a recent decline in their functional status and demonstrates the ability to make significant improvements in function in a reasonable and predictable amount of time.     Precautions / Restrictions Precautions Precautions: Fall Restrictions Weight Bearing Restrictions: No      Mobility  Bed Mobility Overal bed mobility: Modified Independent                  Transfers Overall transfer level: Modified independent                      Ambulation/Gait Ambulation/Gait assistance: Modified independent (Device/Increase time) Gait Distance (Feet): 75 Feet Assistive device: Straight cane Gait Pattern/deviations: Decreased step length - right, Decreased step length - left, Decreased stride length Gait velocity: decreased     General Gait Details: slightly labored cadence without loss of balance, on room air with SpO2 at 99%  Stairs            Wheelchair Mobility    Modified Rankin (Stroke Patients Only)       Balance Overall balance assessment: Needs assistance Sitting-balance support: Feet supported, No upper extremity supported Sitting balance-Leahy Scale: Good Sitting balance - Comments: seated at EOB   Standing balance support: During functional activity, Single extremity supported Standing balance-Leahy Scale: Fair Standing balance comment: fair/good using SPC  Pertinent Vitals/Pain Pain Assessment Pain Assessment: No/denies pain    Home Living Family/patient expects to be discharged to:: Private residence Living Arrangements: Children Available Help at Discharge: Family;Available 24 hours/day Type of Home: House Home Access: Level entry       Home Layout: One level Home Equipment: Hand held shower head;Grab bars - tub/shower;Cane - single Librarian, academic (2 wheels);Wheelchair -  Manufacturing systems engineer      Prior Function Prior Level of Function : Independent/Modified Independent             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 ADLs Comments: Independent     Hand Dominance   Dominant Hand: Right    Extremity/Trunk Assessment   Upper Extremity Assessment Upper Extremity Assessment: Overall WFL for tasks assessed    Lower Extremity Assessment Lower Extremity Assessment: Overall WFL for tasks assessed    Cervical / Trunk Assessment Cervical / Trunk Assessment: Kyphotic  Communication   Communication: No difficulties  Cognition Arousal/Alertness: Awake/alert Behavior During Therapy: WFL for tasks assessed/performed Overall Cognitive Status: Within Functional Limits for tasks assessed                                          General Comments      Exercises     Assessment/Plan    PT Assessment All further PT needs can be met in the next venue of care  PT Problem List Decreased strength;Decreased activity tolerance;Decreased balance;Decreased mobility       PT Treatment Interventions      PT Goals (Current goals can be found in the Care Plan section)  Acute Rehab PT Goals Patient Stated Goal: return home with family to assist PT Goal Formulation: With patient/family Time For Goal Achievement: 11/18/22 Potential to Achieve Goals: Good    Frequency       Co-evaluation               AM-PAC PT "6 Clicks" Mobility  Outcome Measure Help needed turning from your back to your side while in a flat bed without using bedrails?: None Help needed moving from lying on your back to sitting on the side of a flat bed without using bedrails?: None Help needed moving to and from a bed to a chair (including a wheelchair)?: None Help needed standing up from a chair using your arms (e.g., wheelchair or bedside chair)?: None Help needed to walk in hospital room?: A Little Help needed climbing 3-5 steps with a  railing? : A Little 6 Click Score: 22    End of Session   Activity Tolerance: Patient tolerated treatment well;Patient limited by fatigue Patient left: in bed;with call bell/phone within reach;with family/visitor present Nurse Communication: Mobility status PT Visit Diagnosis: Unsteadiness on feet (R26.81);Other abnormalities of gait and mobility (R26.89);Muscle weakness (generalized) (M62.81)    Time: 5929-2446 PT Time Calculation (min) (ACUTE ONLY): 11 min   Charges:   PT Evaluation $PT Eval Low Complexity: 1 Low PT Treatments $Therapeutic Activity: 8-22 mins        2:13 PM, 11/18/22 Ocie Bob, MPT Physical Therapist with [facility] [phone] office [phone] mobile phone

## 2022-11-18 NOTE — Discharge Summary (Signed)
Physician Discharge Summary  Gregory Mitchell ZOX:[id number] DOB: [birth year] DOA: 11/17/2022  PCP: Gregory Deiters, MD  Admit date: 11/17/2022 Discharge date: 11/18/2022  Time spent: 40 minutes  Recommendations for Outpatient Follow-up:  Follow outpatient CBC/CMP  Follow blood pressure outpatient - on the soft side, may need further adjustment Torsemide as needed, adjust based on volume status, etc Consider GI follow up, ? Esophagitis, odynophagia, sliding hiatal hernia Needs pulm follow up for concern for MAC   Discharge Diagnoses:  Principal Problem:   AKI (acute kidney injury) Active Problems:   Shortness of breath   Discharge Condition: stable  Diet recommendation: heart healthy   Filed Weights   11/17/22 1133  Weight: 55.3 kg    History of present illness:   Gregory Mitchell is Gregory Mitchell 82 y.o. male with medical history significant of systolic heart failure, COPD on 3 L at baseline, HTN, CKD IIIa and multiple other medical issues presenting with weakness, shortness of breath, and chest pain.    Noted to have CT findings concerning for MAC.  Outpatient follow up planned.  Esophagram with hiatal hernia, already taking PPI.  Esophagitis on imaging, continue PPI, consider GI follow up.  Needs cards follow up with soft BP, his spironolactone, valsartan, jardiance being held on discharge.  Metoprolol dose decreased and torsemide to be as needed for now.  Needs cards follow up.  See below for additional details  Hospital Course:  Assessment and Plan:  Shortness of Breath  Chronic Hypoxic Respiratory Failure  COPD  Concern for MAC Currently on home O2 CT with findings concerning for atypical infectious process like MAC - bronchiectasis and parencymal densities in the RUL, LUL, lingula, and both lower lobes negative covid, RSV, flu -> RVP not collected, I don't think it's necessary S/p steroids and neb in ED, not wheezing now, hold further steroids for now.  Hold abx for  now. Nebulizers prn Given concern for MAC, requested outpatient appt with pulmonology, appreciate Gregory Mitchell assistance  Chest Pain  Odynophagia Daughter notes this is Gregory Mitchell, daily for the past year almost Troponins low and flat, not c/w ACS ? Component of odynophagia? Esophagram -> limited exam, hiatal hernia CT with findings consistent with esophagitis -> PPI BID, follow with GI outpatient   AKI on CKD IIIb Creatinine 2.11 today Follow, hold arb, jardiance, spironolactone Improving, follow outpatient - resuming torsemide as needed saturday   Hypotension Daughter notes SBP in the 60's at home.  Continue metoprolol for now if tolerated. Stop spironolactone and valsartan and jardiance Resume torsemide in 2 days on an as needed basis Follow here, will schedule midodrine rather than prn for now  Systolic Heart Failure EF 20-25% 07/2022 Appears euvolemic Due to soft blood pressure, decrease metoprolol to 12.5 mg, stop valsartan and spironolactone and jardiance.  Resume torsemide on Saturday, as needed dosing.  Given precautions for low blood pressures and instructions for meds.   Follow with cards outpatient    Insomnia Remeron   BPH flomax   Chronic pain Home norco      Procedures: none   Consultations: none  Discharge Exam: Vitals:   11/18/22 0745 11/18/22 1032  BP:  101/63  Pulse:  84  Resp: (!) 22 19  Temp:  98.6 F (37 C)  SpO2:  100%   Asking to discharge home Son at bedside  General: No acute distress. Cardiovascular: RRR Lungs: unlabored Abdomen: Soft, nontender, nondistended  Neurological: Alert and oriented 3. Moves all extremities 4 with equal  strength. Cranial nerves II through XII grossly intact. Extremities: No clubbing or cyanosis. No edema.   Discharge Instructions   Discharge Instructions     (HEART FAILURE PATIENTS) Call MD:  Anytime you have any of the following symptoms: 1) 3 pound weight gain in 24 hours or 5  pounds in 1 week 2) shortness of breath, with or without Gregory Mitchell dry hacking cough 3) swelling in the hands, feet or stomach 4) if you have to sleep on extra pillows at night in order to breathe.   Complete by: As directed    Call MD for:  difficulty breathing, headache or visual disturbances   Complete by: As directed    Call MD for:  extreme fatigue   Complete by: As directed    Call MD for:  hives   Complete by: As directed    Call MD for:  persistant dizziness or light-headedness   Complete by: As directed    Call MD for:  persistant nausea and vomiting   Complete by: As directed    Call MD for:  redness, tenderness, or signs of infection (pain, swelling, redness, odor or green/yellow discharge around incision site)   Complete by: As directed    Call MD for:  severe uncontrolled pain   Complete by: As directed    Call MD for:  temperature >100.4   Complete by: As directed    Diet - low sodium heart healthy   Complete by: As directed    Discharge instructions   Complete by: As directed    You were seen for shortness of breath, chest pain, and weakness.  You're eager to discharge today, we'll get you home, but outpatient follow up with cardiology and gastroenterology is extremely important.  Your CT chest showed Gregory Mitchell possible atypical infection.  Follow up with Gregory Mitchell with pulmonology to discuss management.    Your blood pressures are low.  Continue scheduling the midodrine.  Follow your blood pressure if you have lightheadedness or dizziness.  Check your blood pressure every so often.  If your systolic blood pressure is less than 100, do not take the torsemide or metoprolol.  For now, we're STOPPING your valsartan and spironolactone and jardiance.  On Saturday, you can resume your torsemide AS needed.  WEIGH yourself DAILY.  If your weight increases by 3 lbs in Gregory Mitchell day or by 5 lbs in Gregory Mitchell week, or if you notice swelling, take the torsemide.  We'll decrease your metoprolol to 12.5 mg daily.  We  did an esophagram for your discomfort with swallowing.  You have Gregory Mitchell hiatal hernia.  You had evidence of esophagitis (inflamed esophagus) on imaging.  Continue your protonix.  Discuss with your PCP whether GI follow up is appropriate outpatient.  Follow up with cardiology within Trevonn Hallum week to repeat labs.  Your kidney function is improved, but still not to baseline.  You should follow repeat labs to determine if med doses need to be adjusted or changed.  Return for new, recurrent, or worsening symptoms.  Please ask your PCP to request records from this hospitalization so they know what was done and what the next steps will be.   Increase activity slowly   Complete by: As directed       Allergies as of 11/18/2022   No Known Allergies      Medication List     STOP taking these medications    dapagliflozin propanediol 10 MG Tabs tablet Commonly known as: Farxiga   spironolactone 25  MG tablet Commonly known as: ALDACTONE   valsartan 40 MG tablet Commonly known as: Diovan       TAKE these medications    albuterol (2.5 MG/3ML) 0.083% nebulizer solution Commonly known as: PROVENTIL Take 3 mLs (2.5 mg total) by nebulization every 4 (four) hours as needed for wheezing or shortness of breath.   albuterol 108 (90 Base) MCG/ACT inhaler Commonly known as: VENTOLIN HFA Inhale 1-2 puffs into the lungs every 4 (four) hours as needed for shortness of breath.   atorvastatin 80 MG tablet Commonly known as: LIPITOR Take 80 mg by mouth at bedtime.   famotidine 40 MG tablet Commonly known as: PEPCID Take 40 mg by mouth daily.   guaiFENesin 600 MG 12 hr tablet Commonly known as: MUCINEX Take 600 mg by mouth 2 (two) times daily.   HM Senna-S 8.6-50 MG tablet Generic drug: senna-docusate Take 2 tablets by mouth daily.   HYDROcodone-acetaminophen 7.5-325 MG tablet Commonly known as: NORCO Take 1 tablet by mouth 2 (two) times daily as needed for moderate pain.   metoprolol succinate 25  MG 24 hr tablet Commonly known as: Toprol XL Take 0.5 tablets (12.5 mg total) by mouth daily. Hold if your systolic blood pressure (top number) is less than 100 What changed:  how much to take additional instructions   midodrine 2.5 MG tablet Commonly known as: PROAMATINE Take 1 tablet (2.5 mg total) by mouth 3 (three) times daily with meals. What changed:  how much to take how to take this when to take this additional instructions   mirtazapine 15 MG tablet Commonly known as: REMERON Take 15 mg by mouth daily.   OXYGEN Inhale 3 L into the lungs continuous.   pantoprazole 40 MG tablet Commonly known as: PROTONIX Take 40 mg by mouth 2 (two) times daily.   tamsulosin 0.4 MG Caps capsule Commonly known as: FLOMAX Take 0.8 mg by mouth daily.   torsemide 20 MG tablet Commonly known as: DEMADEX Take 1 tablet (20 mg total) by mouth daily as needed. Take as needed for swelling or weight gain of 3 lbs in Jaydyn Bozzo day or 5 lbs in Undrea Archbold week.  Hold for low blood pressure (systolic blood pressure less than 100). Start taking on: November 20, 2022 What changed:  when to take this reasons to take this additional instructions These instructions start on November 20, 2022. If you are unsure what to do until then, ask your doctor or other care provider.       No Known Allergies    The results of significant diagnostics from this hospitalization (including imaging, microbiology, ancillary and laboratory) are listed below for reference.    Significant Diagnostic Studies: DG ESOPHAGUS W SINGLE CM (SOL OR THIN BA)  Result Date: 11/18/2022 CLINICAL DATA:  Dysphagia. EXAM: ESOPHOGRAM/BARIUM SWALLOW TECHNIQUE: Single contrast examination was performed using  thin barium. FLUOROSCOPY: Radiation Exposure Index (as provided by the fluoroscopic device): 14.2 mGy Kerma COMPARISON:  None Available. FINDINGS: Exam is somewhat limited as the patient only took small sips of contrast. Also, patient could not stand  and therefore was imaged in approximately 45 degree upright position. No definite mass or stricture is noted. Small sliding-type hiatal hernia is noted. No definite reflux was noted IMPRESSION: Limited exam as described above. Small hiatal hernia. No definite mass or stricture is noted. Electronically Signed   By: Lupita Raider M.D.   On: 11/18/2022 10:22   CT Chest Wo Contrast  Result Date: 11/17/2022 CLINICAL DATA:  Chest pain and shortness of breath. Minimal left basilar consolidation or volume loss on chest radiographs earlier today. EXAM: CT CHEST WITHOUT CONTRAST TECHNIQUE: Multidetector CT imaging of the chest was performed following the standard protocol without IV contrast. RADIATION DOSE REDUCTION: This exam was performed according to the departmental dose-optimization program which includes automated exposure control, adjustment of the mA and/or kV according to patient size and/or use of iterative reconstruction technique. COMPARISON:  Chest radiographs earlier today. Chest CTA dated 05/29/2022 FINDINGS: Cardiovascular: Atheromatous calcifications, including the coronary arteries and aorta. Mildly enlarged heart. Diffuse low-density of the blood relative to the arterial walls. Mediastinum/Nodes: Mild-to-moderate diffuse esophageal wall thickening. Unremarkable thyroid gland. No enlarged lymph nodes. Lungs/Pleura: Stable areas of cylindrical bronchiectasis and associated parenchymal densities the right upper lobe, left upper lobe/lingula and both lower lobes. Bilateral multilobar tree-in-bud opacities and micro nodularity are again demonstrated with mild progression in the right lower lobe and right upper lobe. No pleural fluid. 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 Stable surgical clips in the retroperitoneum on the right, status post right nephrectomy. Musculoskeletal: Thoracic and lower cervical spine degenerative changes, including changes of DISH. Fused sternomanubrial joint.  IMPRESSION: 1. Bilateral multilobar tree-in-bud opacities and micro nodularity with mild progression in the right lower lobe and right upper lobe. This is most likely due to an atypical infectious process such as Mycobacterium avium complex. 2. Stable areas of cylindrical bronchiectasis and associated parenchymal densities the right upper lobe, left upper lobe/lingula and both lower lobes. 3. Mild-to-moderate diffuse esophageal wall thickening, most likely due to esophagitis. 4. Calcific coronary artery and aortic atherosclerosis. 5. Diffuse low-density of the blood relative to the arterial walls, compatible with anemia. Aortic Atherosclerosis (ICD10-I70.0). Electronically Signed   By: Beckie SaltsSteven  Reid M.D.   On: 11/17/2022 13:38   DG Chest 2 View  Result Date: 11/17/2022 CLINICAL DATA:  Chest pain EXAM: CHEST - 2 VIEW COMPARISON:  09/25/2022 FINDINGS: Enlarged cardiac silhouette. Left base volume loss or minimal consolidation. No pneumothorax or pleural effusion. Normal pulmonary vasculature. Calcified aorta. Thoracic degenerative changes. IMPRESSION: Enlarged cardiac silhouette. Minimal left basilar consolidation or volume loss. Electronically Signed   By: Layla MawJoshua  Pleasure M.D.   On: 11/17/2022 12:19    Microbiology: Recent Results (from the past 240 hour(s))  Resp panel by RT-PCR (RSV, Flu Gregory Mitchell&B, Covid) Anterior Nasal Swab     Status: None   Collection Time: 11/17/22 12:11 PM   Specimen: Anterior Nasal Swab  Result Value Ref Range Status   SARS Coronavirus 2 by RT PCR NEGATIVE NEGATIVE Final    Comment: (NOTE) SARS-CoV-2 target nucleic acids are NOT DETECTED.  The SARS-CoV-2 RNA is generally detectable in upper respiratory specimens during the acute phase of infection. The lowest concentration of SARS-CoV-2 viral copies this assay can detect is 138 copies/mL. Schylar Allard negative result does not preclude SARS-Cov-2 infection and should not be used as the sole basis for treatment or other patient management  decisions. Gregory Mitchell negative result may occur with  improper specimen collection/handling, submission of specimen other than nasopharyngeal swab, presence of viral mutation(s) within the areas targeted by this assay, and inadequate number of viral copies(<138 copies/mL). Gregory Mitchell negative result must be combined with clinical observations, patient history, and epidemiological information. The expected result is Negative.  Fact Sheet for Patients:  BloggerCourse.comhttps://www.fda.gov/media/152166/download  Fact Sheet for Healthcare Providers:  SeriousBroker.ithttps://www.fda.gov/media/152162/download  This test is no t yet approved or cleared by the [place] FDA and  has been authorized for detection and/or diagnosis of  SARS-CoV-2 by FDA under an Emergency Use Authorization (EUA). This EUA will remain  in effect (meaning this test can be used) for the duration of the COVID-19 declaration under Section 564(b)(1) of the Act, 21 U.S.C.section 360bbb-3(b)(1), unless the authorization is terminated  or revoked sooner.       Influenza Gregory Mitchell by PCR NEGATIVE NEGATIVE Final   Influenza B by PCR NEGATIVE NEGATIVE Final    Comment: (NOTE) The Xpert Xpress SARS-CoV-2/FLU/RSV plus assay is intended as an aid in the diagnosis of influenza from Nasopharyngeal swab specimens and should not be used as Dailah Opperman sole basis for treatment. Nasal washings and aspirates are unacceptable for Xpert Xpress SARS-CoV-2/FLU/RSV testing.  Fact Sheet for Patients: BloggerCourse.com  Fact Sheet for Healthcare Providers: SeriousBroker.it  This test is not yet approved or cleared by the [place] FDA and has been authorized for detection and/or diagnosis of SARS-CoV-2 by FDA under an Emergency Use Authorization (EUA). This EUA will remain in effect (meaning this test can be used) for the duration of the COVID-19 declaration under Section 564(b)(1) of the Act, 21 U.S.C. section 360bbb-3(b)(1), unless the  authorization is terminated or revoked.     Resp Syncytial Virus by PCR NEGATIVE NEGATIVE Final    Comment: (NOTE) Fact Sheet for Patients: BloggerCourse.com  Fact Sheet for Healthcare Providers: SeriousBroker.it  This test is not yet approved or cleared by the [place] FDA and has been authorized for detection and/or diagnosis of SARS-CoV-2 by FDA under an Emergency Use Authorization (EUA). This EUA will remain in effect (meaning this test can be used) for the duration of the COVID-19 declaration under Section 564(b)(1) of the Act, 21 U.S.C. section 360bbb-3(b)(1), unless the authorization is terminated or revoked.  Performed at [facility], [address] [place], [place] [address]      Labs: Basic Metabolic Panel: Recent Labs  Lab 11/17/22 1137 11/18/22 0444  NA 136 135  K 4.3 4.0  CL 101 105  CO2 27 26  GLUCOSE 123* 117*  BUN 44* 38*  CREATININE 2.11* 1.82*  CALCIUM 8.3* 8.1*   Liver Function Tests: Recent Labs  Lab 11/18/22 0444  AST 15  ALT 12  ALKPHOS 42  BILITOT 0.5  PROT 6.4*  ALBUMIN 2.5*   No results for input(s): "LIPASE", "AMYLASE" in the last 168 hours. No results for input(s): "AMMONIA" in the last 168 hours. CBC: Recent Labs  Lab 11/17/22 1137 11/18/22 0444  WBC 5.8 4.1  HGB 9.0* 7.9*  HCT 30.9* 26.9*  MCV 88.0 88.2  PLT 314 312   Cardiac Enzymes: No results for input(s): "CKTOTAL", "CKMB", "CKMBINDEX", "TROPONINI" in the last 168 hours. BNP: BNP (last 3 results) Recent Labs    09/13/22 1518 09/25/22 1211 10/04/22 1054  BNP 209.1* 791.0* 335.2*    ProBNP (last 3 results) No results for input(s): "PROBNP" in the last 8760 hours.  CBG: No results for input(s): "GLUCAP" in the last 168 hours.     Signed:  Lacretia Nicks MD.  [facility] 11/18/2022, 12:26 PM

## 2022-11-21 NOTE — Progress Notes (Unsigned)
Gregory Mitchell, male    DOB: [birth year]    MRN: [id number]   Brief patient profile:  7  yobm  quit smoking 1978 no apparent  resp sequelae referred to pulmonary clinic in [facility]  01/28/2022 for copd eval  by Gregory An PA following in cards clinic for severe chf     Prior pulmonary eval 2015 c/o doe x 2 y with GOLD 3 criteria at that point with hrct 11/16/13 with bronchiectasis ? MAI   Rx 02 around 2021  ? After covid ?   History of Present Illness  01/28/2022  Pulmonary/ 1st office eval/ Gregory Mitchell Office / trelegy maint Chief Complaint  Patient presents with   Consult    Ref by cardiology for copd  does have O2 at home he uses prn   Dyspnea:  maybe 100 ft at grocery handicap parking  Cough: x  years worse x one year p covid 19 in 05/2020 along with 02 need assoc with hoarsness / min  white mucus production worse in ams Sleep:on side flat bed 2 pillows  SABA use: not really helping  02 :  3lpm hs and prn but not titrating  Rec Stop trelegy and the ventolin / albuterol  Pantoprazole 40 mg Take 30- 60 min before your first and last meals of the day  Take duoneb  four times daily  Add budesonide 0.25 mg twice daily to the nebulizer  Prednisone 10 mg take  4 each am x 2 days,   2 each am x 2 days,  1 each am x 2 days and stop     Allergy screen 01/28/22  >  Eos 0.2 /  IgE  / ESR 41.  Wear 02 so you keep your 02 saturation s above 90% at all time  For cough > mucinex 1200 mg every 12 hours and Use the flutter valve as much possible Please schedule a follow up office visit in 4 weeks, sooner if needed  with all medications /inhalers/ solutions     02/24/2022  f/u ov/[place] office/Gregory Mitchell  maint on nebs but confused on which ones when / also still on entresto   Chief Complaint  Patient presents with   Follow-up    Feels breathing is getting worse.   Dyspnea:  food lion leaning on cart/ s 02  Cough: yellowish better p levaquin then worse again   Sleeping: on bed is level/flat with 2 pillows  SABA use: not much hfa 02: 4lpm 24/7  Covid status: vax x 2  Rec Protonix 40 mg Take 30- 60 min before your first and last meals of the day  - new prescription today  Prednisone Take 4 for three days 3 for three days 2 for three days 1 for three days and stop  Levaquin 500 mg daily  x 7 days  Plan A = Automatic = Always=    Aformoterol 15 mcg 1st thing in am and 12 hours and budsonide  0.5  Plan B = Backup (to supplement plan A, not to replace it) Only use your albuterol nebulizer  as a rescue medication Stop fish oil  GERD diet     Admit date: 02/26/2022 Discharge date: 03/02/2022  Discharge to: Home with home health to follow-up with pulmonology and cardiology as well as PCP Discharge Diagnoses:  Pneumonia of left lower lobe due to infectious organism COPD (chronic obstructive pulmonary disease) (CMS-HCC) Heart failure with reduced ejection fraction (CMS-HCC) History of pulmonary embolus (PE)  Diabetes mellitus type 2, noninsulin dependent (CMS-HCC) Dysphagia Resolved Problems: Shortness of breath TIA (transient ischemic attack) Atypical chest pain   Procedures: CT head 7/22 CT abdomen and pelvis, chest 7/22 Chest x-ray 7/22 MRI brain 7/24 V/Q scan 7/24 Venous duplex bilateral lower extremities 7/24       03/08/2022  f/u ov/[place] office/Gregory Mitchell re: GOLD 3  maint on formoterol  but thoroughly confused with meds/ [facility]oins from [facility] Complaint  Patient presents with   Follow-up    Patient feels he is about the same since last visit 2 weeks ago  Dyspnea:  walked in with cane x 50 fts  Cough: yellow mucus looks the same on / off levaquin Sleeping: 4lpm and flat bed/ 2 pillows  SABA use: confused  02: 4lpm hs and prn  Covid status: vax x 3  Rec Protonix (pantoprazole) 40 mg Take 30- 60 min before your first and last meals of the day  - new prescription today  Plan A = Automatic = Always=    Aformoterol 15  mcg 1st thing in am and 12 hours and budsonide 0.25 twice daily  Plan B = Backup (to supplement plan A, not to replace it) Only use your albuterol nebulizer  as a rescue medication  GERD rx Stop entresto  Start valsartan  For cough > mucinex dm 1200 mg every 12 hours and use flutter valve as much as possible   Please schedule a follow up office visit in 4 weeks, sooner if needed  with all medications /inhalers/ solutions/ flutter valve in hand       Discharge Date:  04/16/2022  1)Please talk to your cardiologist about restarting SACUBITRIL-VALSARTAN 24-26 MG PO TABS/Entresto.  Which is a medication for heart failure.. And possibly restarting apixaban/Eliquis which is a blood thinner 2)Avoid ibuprofen/Advil/Aleve/Motrin/Goody Powders/Naproxen/BC powders/Meloxicam/Diclofenac/Indomethacin and other Nonsteroidal anti-inflammatory medications as these will make you more likely to bleed and can cause stomach ulcers, can also cause Kidney problems.  3)You need oxygen at home at  3 to 4 L via nasal cannula continuously while awake and while asleep--- smoking or having open fires around oxygen can cause fire, significant injury and death 4)Repeat CBC and BMP in about a week or so   5)Very low-salt diet advised--- very low-sodium diet advised   6)Weigh yourself daily, call if you gain more than 3 pounds in 1 day or more than 5 pounds in 1 week as your diuretic medications may need to be adjusted     Admission Diagnosis  COPD exacerbation (HCC) [J44.1] Acute respiratory failure with hypoxia (HCC) [J96.01] PNA (pneumonia) [J18.9]     Discharge Diagnosis  COPD exacerbation (HCC) [J44.1] Acute respiratory failure with hypoxia (HCC) [J96.01] PNA (pneumonia) [J18.9]     Principal Problem:   PNA (pneumonia)   Type 2 diabetes mellitus (HCC)   Essential hypertension   COPD GOLD 3 / obstructive bronchiectasis by HRCT in 2015    Chronic kidney disease, stage 3a (HCC)   Chronic combined systolic and  diastolic CHF (congestive heart failure) (HCC)   Pulmonary embolism (HCC)   Chronic respiratory failure with hypoxia (HCC)   Speech abnormality   04/22/2022  post hosp x 2 f/u ov/[place] office/Gregory Jaime re: GOLD 3  maint on confused again re nebs/inhalers - uses bubble pack for pills and did bring the receipts for those   Chief Complaint  Patient presents with   Follow-up    He has improved some since last OV.   Dyspnea:  food lion  walking pushing cart s 02  Cough: some better off entresto  Sleeping: flat bed 2 pillows ok  SABA use: twice a daily inhaler/ also neb but not sure what  02: 3lpm 24/7  Rec Continue budesonide with the other nebulizer solution twice daily but call me with the name Work on inhaler technique: Make sure you check your oxygen saturation  AT  your highest level of activity (not after you stop)   to be sure it stays over 90%        05/06/2022  f/u ov/[place] office/Divonte Senger re: GOLD 3 copd  maint on performist/ budesonide   Chief Complaint  Patient presents with   Follow-up    Breathing is not doing well today   Dyspnea:  did food lion one week prior to OV  /  50 ft to mailbox s 02 Cough: no  Sleeping: flat bed 2pillows  SABA use: last used weeks  02: 3lpm hs and prn daytime  Covid status: vax x 2 or 3  Nose drips a lot / nothing purulent  Rec My office will be contacting you by phone for referral to ENT   - if you don't hear back from my office within one week please call us back or notify us thru MyChart and we'll address it right away.  Use purse lip breathing when you wheeze For cough > mucinex dm 1200 mg every 12 hours and use flutter valve as much as possible   Plan A = Automatic = Always=    Aformoterol/bud 1st thing in am and 12 hours later  Plan B = Backup (to supplement plan A, not to replace it) Only use your ipatropium/albuterol inhaler as a rescue medication  If worse > Prednisone 10 mg take  4 each am x 2 days,   2 each am x 2 days,  1 each  am x 2 days and stop   Please schedule a follow up visit in 2 months but call sooner if needed      07/14/2022  f/u ov/[place] office/Sidrah Harden re: f/u  maint on albuterol / bud  Chief Complaint  Patient presents with   Follow-up    Chest pain that is radiating to his stomach. States this started today and wanted to f/u with Dr. Sherene Sires before going to ER   Onset of cp x 5 days worse with walking and with deep breath -  better supine no assoc   no nausea or vomiting  Dyspnea:  more sob with onset / not using gerd rx bid  Cough: none  Sleeping: flat bed with 2 pillows  SABA use: neb helps  02: 3lpm hs and prn  Last pred several weeks ago helped breathing / no real flare of cough since  Rec Go directly to ER / ER nurse is Herbert Seta knows you are coming  Bring in all inhalers and prednisone prescriptions to be sure you have what you need   Admit date: 11/17/2022 Discharge date: 11/18/2022   Recommendations for Outpatient Follow-up:  Follow outpatient CBC/CMP  Follow blood pressure outpatient - on the soft side, may need further adjustment Torsemide as needed, adjust based on volume status, etc Consider GI follow up, ? Esophagitis, odynophagia, sliding hiatal hernia Needs pulm follow up for concern for MAC    Discharge Diagnoses:  Principal Problem:   AKI (acute kidney injury) Active Problems:   Shortness of breath History of present illness:  ALESANDRO STUEVE is a 82 y.o. male with medical history significant  of systolic heart failure, COPD on 3 L at baseline, HTN, CKD IIIa and multiple other medical issues presenting with weakness, shortness of breath, and chest pain.  Noted to have CT findings concerning for MAC.  Outpatient follow up planned. Esophagram with hiatal hernia, already taking PPI.  Esophagitis on imaging, continue PPI, consider GI follow up. Needs cards follow up with soft BP, his spironolactone, valsartan, jardiance being held on discharge.  Metoprolol dose decreased and  torsemide to be as needed for now.  Needs cards follow up.   Hospital Course:  Assessment and Plan:   Shortness of Breath  Chronic Hypoxic Respiratory Failure  COPD  Concern for MAC Currently on home O2 CT with findings concerning for atypical infectious process like MAC - bronchiectasis and parencymal densities in the RUL, LUL, lingula, and both lower lobes S/p steroids and neb in ED, not wheezing now, hold further steroids for now.  Hold abx for now. Nebulizers prn Given concern for MAC, requested outpatient appt with pulmonology, appreciate Dr. Sherene Sires assistance   Chest Pain  Odynophagia Daughter notes this is a chronic complaint, daily for the past year almost Troponins low and flat, not c/w ACS ? Component of odynophagia? Esophagram -> limited exam, hiatal hernia CT with findings consistent with esophagitis -> PPI BID, follow with GI outpatient   AKI on CKD IIIb Creatinine 2.11 at d/c Follow, hold arb, jardiance, spironolactone Improving, follow outpatient - resuming torsemide as needed     Hypotension Daughter notes SBP in the 60's at home.  Continue metoprolol for now if tolerated. Stop spironolactone and valsartan and jardiance Resume torsemide  on Mitchell as needed basis  will schedule midodrine rather than prn for now   Systolic Heart Failure EF 20-25% 07/2022 Appears euvolemic Due to soft blood pressure, decrease metoprolol to 12.5 mg, stop valsartan and spironolactone and jardiance.  Resume torsemide as needed dosing.  Given precautions for low blood pressures and instructions for meds.   Follow with cards outpatient       11/22/2022  f/u ov/[place] office/Demeisha Geraghty re: Bronchiectasis/ GOLD 3 copd/02 dep maint on ? (Very confused again with maint vs prns   Chief Complaint  Patient presents with   Follow-up    Pt f/u was admitted @ [facility] from 4/10-4/11. Pt states hospital performed a CT and seen something abnormal on his lungs and wanted him to f/u w/ pulmonologist     Dyspnea:  at rest  Cough: very congested / not using flutter / mucus white  Sleeping: bricks x 6 in / 2 pillows /insomnia s noct resp cc  SABA use: duoneb q 4 and formoterol/bud bid  02: 3lpm 24/7  Cp x > one year? Etiology diffuse R ant and worse with coughing fits   No obvious day to day or daytime variability or assoc / purulent sputum or mucus plugs or hemoptysis or   chest tightness, subjective wheeze or overt sinus or hb symptoms.     Also denies any obvious fluctuation of symptoms with weather or environmental changes or other aggravating or alleviating factors except as outlined above   No unusual exposure hx or h/o childhood pna/ asthma or knowledge of premature birth.  Current Allergies, Complete Past Medical History, Past Surgical History, Family History, and Social History were reviewed in [employer] record.  ROS  The following are not active complaints unless bolded Hoarseness, sore throat, dysphagia, dental problems, itching, sneezing,  nasal congestion or discharge of excess mucus or purulent secretions, ear  ache,   fever, chills, sweats, unintended wt loss or wt gain, classically  exertional cp,  orthopnea pnd or arm/hand swelling  or leg swelling, presyncope, palpitations, abdominal pain, anorexia improvingon remeron , nausea, vomiting, diarrhea  or change in bowel habits or change in bladder habits, change in stools or change in urine, dysuria, hematuria,  rash, arthralgias, visual complaints, headache, numbness, weakness or ataxia or problems with walking or coordination,  change in mood or  memory.                       Past Medical History:  Diagnosis Date   Asthma    Chronic renal insufficiency    COPD (chronic obstructive pulmonary disease) (HCC)    Heart failure (HCC)    a. EF 25-30% in 08/2019 with cath showing normal cors b. EF 20-25% in 09/2020 c. 30-35% in 02/2021 d. EF 20-25% in 10/2021   Hypertension    Pulmonary emboli (HCC)  03/2019   Renal insufficiency    Type 2 diabetes mellitus (HCC)        Objective:    Wts  11/22/2022        123  07/14/2022       134   05/06/2022       139 04/22/2022       144  03/08/2022       148   02/24/22 151 lb 3.2 oz (68.6 kg)  01/28/22 148 lb (67.1 kg)  12/18/21 149 lb (67.6 kg)    Vital signs reviewed  11/22/2022  - Note at rest 02 sats  94% on 3lpm NP    General appearance:    chronically ill somber w/c bound bm nad at rest though congested sounding cough    HEENT : Oropharynx  clear  Nasal turbinates mod edema   NECK :  without  apparent JVD/ palpable Nodes/TM    LUNGS: no acc muscle use,  Mild barrel  contour chest wall with bilateral  Distant bs s audible wheeze and  without cough on insp or exp maneuvers  and mild  Hyperresonant  to  percussion bilaterally     CV:  RRR  no s3 or murmur or increase in P2, and no edema   ABD:  soft and nontender   MS:  Nl gait/ ext warm without deformities Or obvious joint restrictions  calf tenderness, cyanosis or clubbing     SKIN: warm and dry without lesions    NEURO:  alert, approp, nl sensorium - difficulty getting out of chair on wobbly legs                Assessment   Outpatient Encounter Medications as of 11/22/2022  Medication Sig   arformoterol (BROVANA) 15 MCG/2ML NEBU Take 2 mLs (15 mcg total) by nebulization 2 (two) times daily. One vial first thing in am and 12 hours later   atorvastatin (LIPITOR) 80 MG tablet Take 80 mg by mouth at bedtime.   famotidine (PEPCID) 40 MG tablet Take 40 mg by mouth daily.   guaiFENesin (MUCINEX) 600 MG 12 hr tablet Take 600  x2 mg by mouth 2 (two) times daily.   HM SENNA-S 8.6-50 MG tablet Take 2 tablets by mouth daily.   HYDROcodone-acetaminophen (NORCO) 7.5-325 MG tablet Take 1 tablet by mouth 2 (two) times daily as needed for moderate pain.   ipratropium-albuterol (DUONEB) 0.5-2.5 (3) MG/3ML SOLN Take 3 mLs by nebulization every 4 (four) hours as needed.  levofloxacin (LEVAQUIN) 500 MG tablet Take 1 tablet (500 mg total) by mouth daily.   metoprolol succinate (TOPROL XL) 25 MG 24 hr tablet Take 0.5 tablets (12.5 mg total) by mouth daily. Hold if your systolic blood pressure (top number) is less than 100   midodrine (PROAMATINE) 2.5 MG tablet Take 1 tablet (2.5 mg total) by mouth 3 (three) times daily with meals.        mirtazapine (REMERON) 15 MG tablet 2 at bedtime   OXYGEN Inhale 3 L into the lungs continuous.   pantoprazole (PROTONIX) 40 MG tablet Take 40 mg by mouth 2 (two) times daily.   predniSONE (DELTASONE) 10 MG tablet Take  4 each am x 2 days,   2 each am x 2 days,  1 each am x 2 days and stop   tamsulosin (FLOMAX) 0.4 MG CAPS capsule Take 0.8 mg by mouth daily.   torsemide (DEMADEX) 20 MG tablet Take 1 tablet (20 mg total) by mouth daily as needed. Take as needed for swelling or weight gain of 3 lbs in a day or 5 lbs in a week.  Hold for low blood pressure (systolic blood pressure less than 100).   [DISCONTINUED] albuterol (PROVENTIL) (2.5 MG/3ML) 0.083% nebulizer solution Take 3 mLs (2.5 mg total) by nebulization every 4 (four) hours as needed for wheezing or shortness of breath.   [DISCONTINUED] albuterol (VENTOLIN HFA) 108 (90 Base) MCG/ACT inhaler Inhale 1-2 puffs into the lungs every 4 (four) hours as needed for shortness of breath.      Also back on entresto

## 2022-11-22 ENCOUNTER — Ambulatory Visit (INDEPENDENT_AMBULATORY_CARE_PROVIDER_SITE_OTHER): Payer: Medicare HMO | Admitting: Internal Medicine

## 2022-11-22 ENCOUNTER — Encounter: Payer: Self-pay | Admitting: Internal Medicine

## 2022-11-22 VITALS — BP 81/51 | HR 62 | Ht 66.0 in | Wt 123.4 lb

## 2022-11-22 DIAGNOSIS — R1319 Other dysphagia: Secondary | ICD-10-CM | POA: Diagnosis not present

## 2022-11-22 DIAGNOSIS — J449 Chronic obstructive pulmonary disease, unspecified: Secondary | ICD-10-CM

## 2022-11-22 DIAGNOSIS — J9611 Chronic respiratory failure with hypoxia: Secondary | ICD-10-CM | POA: Diagnosis not present

## 2022-11-22 MED ORDER — MIRTAZAPINE 15 MG PO TABS
ORAL_TABLET | ORAL | Status: DC
Start: 2022-11-22 — End: 2022-12-16

## 2022-11-22 MED ORDER — PREDNISONE 10 MG PO TABS: ORAL_TABLET | ORAL | 5 refills | Status: AC

## 2022-11-22 MED ORDER — LEVOFLOXACIN 500 MG PO TABS
500.0000 mg | ORAL_TABLET | Freq: Every day | ORAL | 0 refills | Status: DC
Start: 2022-11-22 — End: 2022-12-16

## 2022-11-22 MED ORDER — ARFORMOTEROL TARTRATE 15 MCG/2ML IN NEBU
15.0000 ug | INHALATION_SOLUTION | Freq: Two times a day (BID) | RESPIRATORY_TRACT | 6 refills | Status: DC
Start: 2022-11-22 — End: 2022-12-16

## 2022-11-22 MED ORDER — IPRATROPIUM-ALBUTEROL 0.5-2.5 (3) MG/3ML IN SOLN: 3.0000 mL | RESPIRATORY_TRACT | Status: DC | PRN

## 2022-11-22 NOTE — Assessment & Plan Note (Addendum)
MBS 04/29/22  Recommended Consults Consider GI evaluation     SLP Diet Recommendations Dysphagia 3 (mechanical soft);Thin     Liquid Administration via Cup     Medication Administration Whole meds with liquid     Supervision Patient able to self feed     Compensations Small sips/bites;Multiple dry swallows after each bite/sip     Postural Changes Seated upright at 90 degrees;Remain upright for at least 30 minutes after feeds/meals   04/29/22 DgEs  1. Mild midthoracic esophageal narrowing, cannot exclude Barretts esophagus. A barium pill left over from the modified barium swallow all remained impacted just above this area of subtle narrowing. No significant mucosal irregularity observed. Endoscopy may be warranted. 2. Nonspecific esophageal dysmotility disorder, with the distal esophagus clearing primarily due to secondary contractions. Fold thickening in the distal esophagus noted suggesting esophagitis. 3. We were not able to fully distend the distal esophagus, cannot exclude smooth stricturing. 4. Small type 1 hiatal hernia.>>>  Referred to GI [place]  - DegEs 11/18/22  Small hiatal hernia. No definite mass or stricture is noted.> continue ppi ac bid  Will also rec double remeron to 30 mg daily for insomnia and anorexia which appear slt better on the 15 mg dosing  F/u in 4weeks with all meds in hand using a trust but verify approach to confirm accurate Medication  Reconciliation The principal here is that until we are certain that the  patients are doing what we've asked, it makes no sense to ask them to do more.    Each maintenance medication was reviewed in detail including emphasizing most importantly the difference between maintenance and prns and under what circumstances the prns are to be triggered using an action plan format where appropriate.  Total time for H and P, chart review, counseling, reviewing neb/02/flutter  device(s) and generating customized AVS unique to this  office visit / same day charting  > 40 min post hosp f/u ov with pt having multiple chronic refractory resp and systemic complaints

## 2022-11-22 NOTE — Assessment & Plan Note (Signed)
Placed on 02 around 2021  - 88% p walking 100 ft 01/28/2022 > rec 3lpm and titrate daytime to keep > 90% at all times as has chf  - 03/08/2022   Walked on RA   x  3  lap(s) =  approx 450  ft  @ moderately slow/cane pace, stopped due to end of study with lowest 02 sats 91% and sob p 1st lap but never stopped   - 05/06/2022   Walked on RA  x  1  lap(s) =  approx 150  ft  @ slow/cane assist pace, stopped due to tired > sob with lowest 02 sats 94%   Rec 3lpm 24/ 7 for now

## 2022-11-22 NOTE — Assessment & Plan Note (Signed)
Quit smoking 1978 PFT 11/16/13   FEV1 37%, ratio 61 , no sign BD response Decreased FVC 45% (19% BD response) . DLCO was unable to be done.  - pulmonary eval 01/28/2022 severe coughing on trelegy and entresto rec duoneb/flutter/mucinex dm and stop trelegy  Then ? Trial off entresto and on just valsartan?  - Allergy screen 01/28/22  >  Eos 0.2 /  IgE  7 - 02/24/2022 changed to bud/perfomist bid with prn duoneb/ 12 d pred taper and levquin 500 x 7days with f/u  q 2 weeks and strongly consider d/c entresto but EF only 25% - 02/24/2022  After extensive coaching inhaler device,  effectiveness =    0% with hfa  - 03/08/2022 d/c entresto (pseudowheeze) > improved 04/22/2022  - MBS/ DgEs 04/23/2022 >>>see es dysfunction - Pred x 6 d prn 05/06/2022 >>> - levaquin 500 x 7d 11/22/2022    Group D (now reclassified as E) in terms of symptom/risk and laba/lama/ICS  therefore appropriate rx at this point >>>  ideally should be on trelegy or breztri but with concern re ? MAI rec   Try change to just laba( lama problematic with bladder outlet issues) Supplement with prn duoneb when can't get comfortable at rest Prednisone 10 mg take  4 each am x 2 days,   2 each am x 2 days,  1 each am x 2 days and stop but try to hold off further steroids if possible  Levaquin x 500 mg daily x 7 days to cover ? MAI acutely > probably would benefit from sputum sample when purulent.

## 2022-11-22 NOTE — Patient Instructions (Addendum)
Plan A = Automatic = Always=  Aformoterol 15 mcg vial every 12 hours (not as needed)  Plan B = Backup (to supplement plan A, not to replace it) Only use your albuterol- ipatropium nebulizer up to very 4 hours as needed if you can't catch your breath   Plan C =  Prednisone Take for 6 days if losing group :  Prednisone 10 mg take  4 each am x 2 days,   2 each am x 2 days,  1 each am x 2 days and stop   Mucinex 1200 mg every 12 hours and use the flutter valve   Levaquin 500 mg daily x 7 days (stop if ankle pain/ tendon pain   Remeron 15 mg try x 2 at bedtime   Please schedule a follow up office visit in 4 weeks, sooner if needed  with all active medications /inhalers/ solutions in hand so we can verify exactly what you are taking. This includes all medications from all doctors and over the counters

## 2022-11-24 ENCOUNTER — Encounter: Payer: Self-pay | Admitting: Nurse Practitioner

## 2022-11-24 ENCOUNTER — Telehealth: Payer: Self-pay

## 2022-11-24 ENCOUNTER — Ambulatory Visit: Payer: Medicare HMO | Attending: Nurse Practitioner | Admitting: Nurse Practitioner

## 2022-11-24 VITALS — BP 89/67 | Wt 124.3 lb

## 2022-11-24 DIAGNOSIS — R9389 Abnormal findings on diagnostic imaging of other specified body structures: Secondary | ICD-10-CM

## 2022-11-24 DIAGNOSIS — J449 Chronic obstructive pulmonary disease, unspecified: Secondary | ICD-10-CM

## 2022-11-24 DIAGNOSIS — I5022 Chronic systolic (congestive) heart failure: Secondary | ICD-10-CM | POA: Diagnosis not present

## 2022-11-24 DIAGNOSIS — I110 Hypertensive heart disease with heart failure: Secondary | ICD-10-CM

## 2022-11-24 DIAGNOSIS — R0602 Shortness of breath: Secondary | ICD-10-CM

## 2022-11-24 DIAGNOSIS — Z789 Other specified health status: Secondary | ICD-10-CM

## 2022-11-24 DIAGNOSIS — Z87891 Personal history of nicotine dependence: Secondary | ICD-10-CM

## 2022-11-24 DIAGNOSIS — J440 Chronic obstructive pulmonary disease with acute lower respiratory infection: Secondary | ICD-10-CM

## 2022-11-24 DIAGNOSIS — R079 Chest pain, unspecified: Secondary | ICD-10-CM | POA: Diagnosis not present

## 2022-11-24 DIAGNOSIS — R1310 Dysphagia, unspecified: Secondary | ICD-10-CM

## 2022-11-24 DIAGNOSIS — J189 Pneumonia, unspecified organism: Secondary | ICD-10-CM

## 2022-11-24 DIAGNOSIS — I10 Essential (primary) hypertension: Secondary | ICD-10-CM

## 2022-11-24 DIAGNOSIS — J479 Bronchiectasis, uncomplicated: Secondary | ICD-10-CM

## 2022-11-24 DIAGNOSIS — G8929 Other chronic pain: Secondary | ICD-10-CM

## 2022-11-24 NOTE — Telephone Encounter (Signed)
Patient Consent for Virtual Visit    Gregory Mitchell, you are scheduled for a virtual visit with your provider today.  Just as we do with appointments in the office, we must obtain your consent to participate.  Your consent will be active for this visit and any virtual visit you may have with one of our providers in the next 365 days.  If you have a MyChart account, I can also send a copy of this consent to you electronically.  All virtual visits are billed to your insurance company just like a traditional visit in the office.  As this is a virtual visit, video technology does not allow for your provider to perform a traditional examination.  This may limit your provider's ability to fully assess your condition.  If your provider identifies any concerns that need to be evaluated in person or the need to arrange testing such as labs, EKG, etc, we will make arrangements to do so.  Although advances in technology are sophisticated, we cannot ensure that it will always work on either your end or our end.  If the connection with a video visit is poor, we may have to switch to a telephone visit.  With either a video or telephone visit, we are not always able to ensure that we have a secure connection.   I need to obtain your verbal consent now.   Are you willing to proceed with your visit today?   Gregory Mitchell has provided verbal consent on 11/24/2022 for a virtual visit (video or telephone).   CONSENT FOR VIRTUAL VISIT FOR:  Gregory Mitchell  By participating in this virtual visit I agree to the following:  I hereby voluntarily request, consent and authorize [facility] and its employed or contracted physicians, physician assistants, nurse practitioners or other licensed health care professionals (the Practitioner), to provide me with telemedicine health care services (the "Services") as deemed necessary by the treating Practitioner. I acknowledge and consent to receive the Services by the  Practitioner via telemedicine. I understand that the telemedicine visit will involve communicating with the Practitioner through live audiovisual communication technology and the disclosure of certain medical information by electronic transmission. I acknowledge that I have been given the opportunity to request an in-person assessment or other available alternative prior to the telemedicine visit and am voluntarily participating in the telemedicine visit.  I understand that I have the right to withhold or withdraw my consent to the use of telemedicine in the course of my care at any time, without affecting my right to future care or treatment, and that the Practitioner or I may terminate the telemedicine visit at any time. I understand that I have the right to inspect all information obtained and/or recorded in the course of the telemedicine visit and may receive copies of available information for a reasonable fee.  I understand that some of the potential risks of receiving the Services via telemedicine include:  Delay or interruption in medical evaluation due to technological equipment failure or disruption; Information transmitted may not be sufficient (e.g. poor resolution of images) to allow for appropriate medical decision making by the Practitioner; and/or  In rare instances, security protocols could fail, causing a breach of personal health information.  Furthermore, I acknowledge that it is my responsibility to provide information about my medical history, conditions and care that is complete and accurate to the best of my ability. I acknowledge that Practitioner's advice, recommendations, and/or decision may be based on factors  not within their control, such as incomplete or inaccurate data provided by me or distortions of diagnostic images or specimens that may result from electronic transmissions. I understand that the practice of medicine is not an exact science and that Practitioner makes no  warranties or guarantees regarding treatment outcomes. I acknowledge that a copy of this consent can be made available to me via my patient portal [facility] MyChart), or I can request a printed copy by calling the office of [facility].    I understand that my insurance will be billed for this visit.   I have read or had this consent read to me. I understand the contents of this consent, which adequately explains the benefits and risks of the Services being provided via telemedicine.  I have been provided ample opportunity to ask questions regarding this consent and the Services and have had my questions answered to my satisfaction. I give my informed consent for the services to be provided through the use of telemedicine in my medical care

## 2022-11-24 NOTE — Progress Notes (Signed)
Virtual Visit via Telephone Note   Because of Gregory Mitchell's co-morbid illnesses, he is at least at moderate risk for complications without adequate follow up.  This format is felt to be most appropriate for this patient at this time.  The patient did not have access to video technology/had technical difficulties with video requiring transitioning to audio format only (telephone).  All issues noted in this document were discussed and addressed.  No physical exam could be performed with this format.  Please refer to the patient's chart for his consent to telehealth for Gregory Mitchell.    Date:  11/24/2022   [name], DOB 1941/04/15, MRN [id number] The patient was identified using 2 identifiers.  Patient Location: Home Provider Location: Home Office   PCP:  Gregory Deiters, MD   [facility] Providers Cardiologist:  Gregory Rich, MD     Evaluation Performed:  Follow-Up Visit  Chief Complaint:  Here for HF follow-up, here to discuss goals of care.  History of Present Illness:    Gregory Mitchell is a 82 y.o. male with a PMH of chronic CHF, hypertension, hyperlipidemia, history of PE, history of longstanding chest pain, COPD (wears 3 liters of oxygen at night), former smoker, history of epigastric pain and melena, who presents today via telephone visit for CHF follow-up.  Previous CV history includes reduced ejection fraction with improvement in 2014.  History of cardiac catheterization in 2013 that showed nonobstructive CAD.  TTE in 2017 showed EF 55 to 60%, grade 1 DD.  Admitted in 2021 with volume overload. TTE revealed EF 25 to 30%, grade 2 DD, mild MR and normal RV. LHC revealed normal coronary arteries, mean PA 17, PCWP 17, LVEDP 13, and CI 3.  Diagnosed with bilateral PEs during admission in 2020, appears unprovoked.  No prior history of blood clots.  TTE in 2022 showed EF 20 to 25%, grade 3 DD, normal RV.  Has been followed in CHF clinic though has  difficulty with transportation.  Echo 02/2021 showed EF 30 to 35%, mild RV dysfunction.  CHF clinic recommended cardiac MRI.   Last seen by Dr. Dina Mitchell on March 23, 2022.  He was taking torsemide as needed.  Due to his ongoing edema, was advised to take torsemide for the next 3 days and then to resume as needed.  Entresto was changed to valsartan.  Because his blood pressures were soft, was not on Aldactone at the time.     Has had a long history of atypical symptoms of chest pain.  Described as a tightness mid chest, can occur at rest or with activity, 10 out of 10 in severity, not positional, worse with eating, better with fluid pill and with nebulizer, unable to determine how long it lasted in duration. Was pending GI endoscopic.    Admitted in 07/2022 with abdominal pain and concern for GI bleed with melena.  EGD on July 16, 2022 with findings of Schatzki's ring s/p dilation, intact dundoplication with protruding sutures, otherwise normal-appearing stomach.  Was placed on clear liquid diet with plan to advance to advance diet as tolerated and discharge home.    Our office was contacted in December, and Dr. Wyline Mood spoke with pulmonologist who felt that it was best to go from [facility] back to [facility].     First saw him for follow-up. Was not doing well. CC was weakness, SHOB, feet/ankle swelling, and similar, ongoing chest pain. Underwent endoscopy and had esophageal dilatation  performed.  Daughter presents concern that patient's systolic blood pressure at home is low (< 100), rechecks this to make sure. Because of diminished scattered crackles noted to auscultation in office that day, I was concerned patient was showing signs and symptoms of pneumonia and recommended ED evaluation.  Patient declined, discussed risk of not being seen in the ED.  Case was discussed with Dr. Wyline Mood who recommended continuing valsartan instead of starting Entresto.    [facility]ed to [facility] on January 14 and  was instructed to head to ED based on recent chest x-ray and lab results.  Troponins x 2 were flat.  BNP 564.  BP on arrival 180/54.  Creatinine 1.59 baseline creatinine at 1.0-1.2.  CXR showed chronic patchy consolidation, reticulation and bronchiectasis at left greater than right lung base with slightly worse than left lung base consolidation, suggesting worsening of chronic/recurrent infection such as due to recurrent aspiration.  Treated with IV antibiotics.  Tested negative for COVID, flu, RSV and respiratory panel.  Was discharged on Augmentin and 5 days of prednisone for COPD exacerbation.   A few days later after discharge, presented to [facility] for COPD exacerbation.  12-lead EKG showed sinus rhythm with first-degree AV block, RBBB, T wave abnormality.  CXR showed progressive left basilar airspace infiltrate in keeping with acute infection/aspiration.  He was prescribed doxycycline, prednisone, Lasix, and Zofran.  Was discharged home to self-care.   Saw Dr. Freida Busman with CHF clinic early Feb 2024. Was volume overloaded and symptomatic. Torsemide increased to 20 mg daily x 4 days, then 20 mg every other day with close follow-up of labs. Started on low dose Aldactone.    Saw pt for follow up and continued to state he was not doing well. CC of weakness and chest pain, similar type of CP as previously. CP occurred around mealtime and noticed with swallowing, ongoing x few months, daily.  Admitted to shortness of breath with his symptoms.  Not staying well hydrated. Reviewed recent CXR results on 08/22/2022 that showed findings consistent with recurrent aspiration. Daughter admitted to occasional low SBP readings as last visit with me. 2 view CXR was negative for anything acute. Pulmonary and GI referrals were made for concern of aspiration. Midodrine was added to medication regimen.    Presented to [facility] with CP on 09/25/2022. BP on arrival 87/65. Was having lower extremity weakness, 3/5. Monitor showed  A-fib, HR 95. Was given IV fluids. Troponins flat. Resp panel negative. BNP 791. Received IV diuretic. Rest of labs stable. CXR revealed diffuse pulmonary congestion. Told to f/u with outpatient cardiology.    At last visit with me, was still not doing well. No changes since last OV, reported same symptoms. Daughter says midodrine helped BP some. Pt stated he needed and ABX and prednisone. Referred to GI and Pulmonology.    Saw HF clinic 10/04/2022. Reported feeling poor and weak. Was found to be volume overloaded. Torsemide was increased. Valsartan decreased to 40 mg daily. Anderson Malta. [place], NP noted patient to be end stage CHF, recommended to discuss Palliative Care soon.    Saw patient in office last on 11/09/2022. He continued to state that he was not doing well. Similar symptoms from last visit. Daughter stated his weakness was getting worse. Was unable to get to office visits, wasn't able to follow-up with GI and Pulmonology. Daughter stated BP remained the same. Was unable to get weight, appeared to have lost weight. Goals of care reviewed and discussed. Telephone visit set  up to continue conversation.    Admitted to [facility] 4/10-4/11 for weakness, shortness of breath, and chest pain. CT scan noted to have findings concerning for MAC with stable areas of bronchiectasis, parenchymal densities in right upper lobe, left upper lobe and both lower lobes. Esophagram showed esophagitis, small sliding type hiatal hernia, no mass or stricture noted.  Recommended continue PPI twice daily to follow-up with GI outpatient.  Hospital course noted by AKI on CKD stage IIIb.  Due to patient's low blood pressures, metoprolol decreased to 12.5 mg, valsartan, Aldactone, and Jardiance were d/c.  Torsemide changed to as needed. Recommended to follow-up with Cardiology outpatient.  Today he presents for telephone follow-up visit with his daughter.  Daughter acts as historian.  Daughter states patient is doing the same,  feels a little better after seeing Dr. Sherene Sires recently who started him on prednisone and antibiotic.  Blood pressures are doing about the same, most recent blood pressure 127/87 per her report.  He is on scheduled midodrine.  Continuing to take valsartan despite the fact that hospitalist stopped this medication. Pt continues to note chronic chest pain, similar/unchanged from previous visit.  At this point, daughter states he is unable to make outpatient visits.  She stated after family conversation, they are willing to proceed with palliative care/home health referral.  Patient and daughter deny any other questions or concerns today.  Past Medical History:  Diagnosis Date   Asthma    Chronic renal insufficiency    COPD (chronic obstructive pulmonary disease)    Heart failure    a. EF 25-30% in 08/2019 with cath showing normal cors b. EF 20-25% in 09/2020 c. 30-35% in 02/2021 d. EF 20-25% in 10/2021   Hypertension    Pulmonary emboli 03/2019   Renal insufficiency    Type 2 diabetes mellitus    Past Surgical History:  Procedure Laterality Date   APPENDECTOMY     BALLOON DILATION N/A 03/18/2020   Procedure: BALLOON DILATION;  Surgeon: Lanelle Bal, DO;  [facility];  Service: Endoscopy;  Laterality: N/A;   CHOLECYSTECTOMY     COLONOSCOPY  10/2006   Dr.Anwar:normal   ESOPHAGEAL BRUSHING  08/24/2021   Procedure: ESOPHAGEAL BRUSHING;  Surgeon: Lanelle Bal, DO;  Location: [facility];  Service: Endoscopy;;   ESOPHAGOGASTRODUODENOSCOPY (EGD) WITH PROPOFOL N/A 03/18/2020   Procedure: ESOPHAGOGASTRODUODENOSCOPY (EGD) WITH PROPOFOL;  Surgeon: Lanelle Bal, DO;  Location[facility];  Service: Endoscopy;  Laterality: N/A;  2:30pm   ESOPHAGOGASTRODUODENOSCOPY (EGD) WITH PROPOFOL N/A 08/24/2021   Procedure: ESOPHAGOGASTRODUODENOSCOPY (EGD) WITH PROPOFOL;  Surgeon: Lanelle Bal, DO;  [facility];  Service: Endoscopy;  Laterality: N/A;  with dilation    ESOPHAGOGASTRODUODENOSCOPY (EGD) WITH PROPOFOL N/A 07/16/2022   Procedure: ESOPHAGOGASTRODUODENOSCOPY (EGD) WITH PROPOFOL;  Surgeon: Corbin Ade, MD;  Location: [facility];  Service: Endoscopy;  Laterality: N/A;   FLEXIBLE SIGMOIDOSCOPY N/A 08/24/2021   Procedure: FLEXIBLE SIGMOIDOSCOPY;  Surgeon: Lanelle Bal, DO;  Location[facility];  Service: Endoscopy;  Laterality: N/A;   MALONEY DILATION  07/16/2022   Procedure: Elease Hashimoto DILATION;  Surgeon: Corbin Ade, MD;  Location: [facility];  Service: Endoscopy;;   NASAL SINUS SURGERY     NEPHRECTOMY     RIGHT (Question CA.  No further therapy needed.)   RIGHT/LEFT HEART CATH AND CORONARY ANGIOGRAPHY N/A 09/03/2019   Procedure: RIGHT/LEFT HEART CATH AND CORONARY ANGIOGRAPHY;  Surgeon: Runell Gess, MD;  Location: [facility]  INVASIVE CV LAB;  Service: Cardiovascular;  Laterality: N/A;     Current Meds  Medication Sig   arformoterol (BROVANA) 15 MCG/2ML NEBU Take 2 mLs (15 mcg total) by nebulization 2 (two) times daily. One vial first thing in am and 12 hours later   atorvastatin (LIPITOR) 80 MG tablet Take 80 mg by mouth at bedtime.   famotidine (PEPCID) 40 MG tablet Take 40 mg by mouth daily.   guaiFENesin (MUCINEX) 600 MG 12 hr tablet Take 600 mg by mouth 2 (two) times daily.   HM SENNA-S 8.6-50 MG tablet Take 2 tablets by mouth daily.   HYDROcodone-acetaminophen (NORCO) 7.5-325 MG tablet Take 1 tablet by mouth 2 (two) times daily as needed for moderate pain.   ipratropium-albuterol (DUONEB) 0.5-2.5 (3) MG/3ML SOLN Take 3 mLs by nebulization every 4 (four) hours as needed.   levofloxacin (LEVAQUIN) 500 MG tablet Take 1 tablet (500 mg total) by mouth daily.   metoprolol succinate (TOPROL XL) 25 MG 24 hr tablet Take 0.5 tablets (12.5 mg total) by mouth daily. Hold if your systolic blood pressure (top number) is less than 100   midodrine (PROAMATINE) 2.5 MG tablet Take 1 tablet (2.5 mg total) by mouth 3 (three) times daily with meals.    mirtazapine (REMERON) 15 MG tablet 2 at bedtime   OXYGEN Inhale 3 L into the lungs continuous.   pantoprazole (PROTONIX) 40 MG tablet Take 40 mg by mouth 2 (two) times daily.   predniSONE (DELTASONE) 10 MG tablet Take  4 each am x 2 days,   2 each am x 2 days,  1 each am x 2 days and stop   tamsulosin (FLOMAX) 0.4 MG CAPS capsule Take 0.8 mg by mouth daily.   torsemide (DEMADEX) 20 MG tablet Take 1 tablet (20 mg total) by mouth daily as needed. Take as needed for swelling or weight gain of 3 lbs in a day or 5 lbs in a week.  Hold for low blood pressure (systolic blood pressure less than 100).     Allergies:   Patient has no known allergies.   Social History   Tobacco Use   Smoking status: Former    Packs/day: 1.00    Years: 15.00    Additional pack years: 0.00    Total pack years: 15.00    Types: Cigarettes    Start date: 07/14/1957    Quit date: 08/09/1976    Years since quitting: 46.3    Passive exposure: Never   Smokeless tobacco: Former    Types: Chew    Quit date: 02/10/2006   Tobacco comments:    chewed tobacco for 10 years  Vaping Use   Vaping Use: Never used  Substance Use Topics   Alcohol use: Not Currently    Alcohol/week: 0.0 standard drinks of alcohol    Comment: no etoh since he was in his 55s   Drug use: Never     Family Hx: The patient's family history includes Breast cancer in his mother; Lung cancer in his brother; Stroke in his mother. There is no history of Colon cancer.  ROS:   Please see the history of present illness.    All other systems reviewed and are negative.   Prior CV studies:   The following studies were reviewed today:  Esophogram 11/18/2022: FINDINGS: Exam is somewhat limited as the patient only took small sips of contrast. Also, patient could not stand and therefore was imaged in approximately 45 degree upright position. No definite mass  or stricture is noted. Small sliding-type hiatal hernia is noted. No definite reflux was noted    IMPRESSION: Limited exam as described above. Small hiatal hernia. No definite mass or stricture is noted.  CT chest 11/17/2022: 1. Bilateral multilobar tree-in-bud opacities and micro nodularity with mild progression in the right lower lobe and right upper lobe. This is most likely due to an atypical infectious process such as Mycobacterium avium complex. 2. Stable areas of cylindrical bronchiectasis and associated parenchymal densities the right upper lobe, left upper lobe/lingula and both lower lobes. 3. Mild-to-moderate diffuse esophageal wall thickening, most likely due to esophagitis. 4. Calcific coronary artery and aortic atherosclerosis. 5. Diffuse low-density of the blood relative to the arterial walls, compatible with anemia.   Aortic Atherosclerosis (ICD10-I70.0).  1 view CXR on 09/25/2022:  MPRESSION: Diffuse bilateral interstitial pulmonary opacity, consistent with edema or atypical/viral infection. No focal airspace opacity.   2 view CXR on 09/21/2022:  Chronic changes without acute findings.    2 view CXR on 08/22/2022:  IMPRESSION: Chronic patchy consolidation, reticulation and bronchiectasis at the left greater than right lung bases, with slightly worsened left lung base consolidation, suggesting worsening of chronic/recurrent infection such as due to recurrent aspiration.   Echocardiogram on July 15, 2022:  1. Left ventricular ejection fraction, by estimation, is 20 to 25%. The  left ventricle has severely decreased function. The left ventricle  demonstrates global hypokinesis. The left ventricular internal cavity size  was moderately dilated. There is mild  left ventricular hypertrophy. Left ventricular diastolic parameters are  indeterminate.   2. Right ventricular systolic function is normal. The right ventricular  size is normal. There is normal pulmonary artery systolic pressure.   3. Left atrial size was severely dilated.   4. Right atrial size was  severely dilated.   5. The mitral valve is normal in structure. No evidence of mitral valve  regurgitation. No evidence of mitral stenosis.   6. The tricuspid valve is abnormal.   7. The aortic valve is tricuspid. Aortic valve regurgitation is not  visualized. No aortic stenosis is present.   8. The inferior vena cava is normal in size with greater than 50%  respiratory variability, suggesting right atrial pressure of 3 mmHg.   Right left heart cath on September 03, 2019: Mr. Lapinsky has normal coronary arteries and low filling pressures suggesting that he has been adequately or over diuresed. He did have a high V waves suggesting that he potentially has significant mitral regurgitation. His systolic pressures were in the mid to high 90s and I therefore gave him a 250 cc bolus of saline given his low LVEDP and wedge. He will need guideline directed optimal medical therapy for his LV dysfunction. The sheath removed and a TR band was placed on the right wrist to achieve patent hemostasis. The patient left lab in stable condition. His renal function be carefully monitored. Dr. Bufford Buttner was notified of these results.   Myoview on Dec 11, 2014: IMPRESSION: 1. Moderate size region of inferior wall myocardial scar. No ischemic territories seen. Overlying soft tissue attenuation artifact cannot entirely be ruled out.   2. Mild inferior wall hypokinesis.   3. Left ventricular ejection fraction 48%   4.  Low to intermediate-risk stress test findings*.  Labs/Other Tests and Data Reviewed:    EKG:  An ECG dated 11/17/2022 was personally reviewed today and demonstrated:  85, bpm, bifascicular block, no acute ischemic changes.  Recent Labs: 07/15/2022: Magnesium 2.2 10/04/2022: B Natriuretic Peptide  335.2 11/18/2022: ALT 12; BUN 38; Creatinine, Ser 1.82; Hemoglobin 7.9; Platelets 312; Potassium 4.0; Sodium 135   Recent Lipid Panel No results found for: "CHOL", "TRIG", "HDL", "CHOLHDL", "LDLCALC",  "LDLDIRECT"  Wt Readings from Last 3 Encounters:  11/24/22 124 lb 4.8 oz (56.4 kg)  11/22/22 123 lb 6.4 oz (56 kg)  11/17/22 122 lb (55.3 kg)     Objective:    Vital Signs:  BP (!) 89/67   Wt 124 lb 4.8 oz (56.4 kg)   BMI 20.06 kg/m    Due to nature of today's visit, physical exam was unable to be performed.  ASSESSMENT & PLAN:    Chronic systolic CHF TTE 16/1096 revealed EF 20-25%, unchanged from 10/2021.  Meds recently stopped from past hospital visit due to hypotension. Recommended to hold Valsartan at this time according to hospitalist's instructions. Continue current medication regimen. Cannot uptitrate GDMT at this time, discussed my concern that patient appears to be in end-stage CHF. ED precautions discussed. Low sodium diet, fluid restriction <2L, and daily weights encouraged. Educated to contact our office for weight gain of 2 lbs overnight or 5 lbs in one week. Patient and family agreeable to palliative care/HH. Will contact [facility] for referral to Home-based primary Care/palliative care with MD/APP. Will recommend HH obtain labs at home visit.    Chronic chest pain Similar presentation at last OV. Chronic, stable for 1 year per daughter's report, atypical. Does not sound cardiac in eitology. Says symptoms occur around meal time and reviewed previous CXR results which shows findings consistent of aspiration. Previously placed referral for GI and Pulmonology for pt to be seen ASAP, however pt is unable to get to office visits d/t weakness - see #1. Referring to HH/palliative care. Cath 2021 showed normal coronary arteries. ED precautions discussed.    Shortness of breath/ COPD/Bronchiectasis/ MAC lung disease (abnormal CT scan of chest) Improvement since last OV since starting ABX and prednisone. See previous CXR and CT chest results.  Symptomatology and signs consistent with recurrent aspiration pneumonia. Previously referred to GI, however pt has not been able to go to OV  d/t weakness - see #1. Referring to HH/palliative care. Continue current medication regimen.  ED precautions discussed.   HTN BP labile and still has low readings, most recent reading per daughter's report was 127/87. Continue midodrine and current medications. Several meds stopped at hospital d/c. Instructed to hold Valsartan for now until next OV.  Encouraged adequate hydration. Discussed to monitor BP at home at least 2 hours after medications and sitting for 5-10 minutes. Continue to monitor and log BP at home. Heart healthy diet encouraged.    5. Aspiration/ Dysphagia, recurrent congestion/PNA? Recent esophagram showed small hiatal hernia, no mass or stricture noted. Previously referred to GI as mentioned above, however unable to make visit at this time d/t weakness, see #1. Referring to HH/palliative care.    6. Poor prognosis Reviewed/discussed goals of care and discussed palliative care/hospice care. Pt and family agreeable to proceed with palliative care referral at this time. Will contact [facility] for Palliative Care MD/APP to visit at home as patient is not able to make office visits at this time.    7. Disposition: Follow up with me or APP in 1 month via telephone visit per daughter's request.    Time:   Today, I have spent 25 minutes with the patient with telehealth technology discussing the above problems.     Medication Adjustments/Labs and Tests Ordered: Current medicines are  reviewed at length with the patient today.  Concerns regarding medicines are outlined above.   Tests Ordered: No orders of the defined types were placed in this encounter.   Medication Changes: No orders of the defined types were placed in this encounter.   Follow Up:  Virtual Visit  in 1 month(s)  Signed, Sharlene Dory, NP  11/24/2022 3:25 PM    [facility]

## 2022-11-24 NOTE — Patient Instructions (Signed)
Medication Instructions:  Your physician recommends that you continue on your current medications as directed. Please refer to the Current Medication list given to you today.   Labwork: none  Testing/Procedures: none  Follow-Up:  Your physician recommends that you schedule a follow-up appointment in: 1 month  Any Other Special Instructions Will Be Listed Below (If Applicable).  If you need a refill on your cardiac medications before your next appointment, please call your pharmacy.  

## 2022-12-01 ENCOUNTER — Ambulatory Visit: Payer: Medicare HMO | Admitting: Internal Medicine

## 2022-12-14 ENCOUNTER — Ambulatory Visit: Payer: Medicare HMO | Admitting: Internal Medicine

## 2022-12-15 ENCOUNTER — Telehealth: Payer: Self-pay | Admitting: *Deleted

## 2022-12-15 NOTE — Telephone Encounter (Signed)
Patient's daughter is out of medications. (bucimide and albuterol)  Patient uses [facility]. Patient's daughter would like a call back [phone]

## 2022-12-15 NOTE — Telephone Encounter (Signed)
ATC pt daughter LVM for her to call office back

## 2022-12-16 ENCOUNTER — Other Ambulatory Visit: Payer: Self-pay | Admitting: Internal Medicine

## 2022-12-16 MED ORDER — IPRATROPIUM-ALBUTEROL 0.5-2.5 (3) MG/3ML IN SOLN: 3.0000 mL | RESPIRATORY_TRACT | 11 refills | Status: AC | PRN

## 2022-12-16 MED ORDER — ARFORMOTEROL TARTRATE 15 MCG/2ML IN NEBU: 15.0000 ug | INHALATION_SOLUTION | Freq: Two times a day (BID) | RESPIRATORY_TRACT | 6 refills | Status: AC

## 2022-12-16 NOTE — Telephone Encounter (Signed)
[name] states patient needs refill for antibiotic. Pharmacy is [facility]. [name] phone number is [phone].

## 2022-12-16 NOTE — Telephone Encounter (Signed)
Called and spoke w/ pt daughter he is needing refills of the Brovana and Duoneb nebulizers. I am unable to fill them because it is a hard stop. Sending to MW for further f/u

## 2022-12-17 MED ORDER — LEVOFLOXACIN 500 MG PO TABS: 500.0000 mg | ORAL_TABLET | Freq: Every day | ORAL | 0 refills | Status: AC

## 2022-12-17 NOTE — Telephone Encounter (Signed)
Spoke to patient's daughter, [name] ([place]). She stated that patient is requesting refill on Levaquin. Sx are still lingering. Prod cough with thick green sputum, eye swelling, chills, body aches wheezing and increased SOB x3d. Denied f/s or additional sx No recent covid test. Patient plans to start prednisone today. He had a Rx on hand.     Dr. Sherene Sires, please advise. Thanks

## 2022-12-17 NOTE — Telephone Encounter (Signed)
Levaquin 500 mg daily x 7 d

## 2022-12-17 NOTE — Addendum Note (Signed)
Addended by: Lajoyce Lauber A on: 12/17/2022 12:19 PM   Modules accepted: Orders

## 2022-12-17 NOTE — Telephone Encounter (Signed)
Levaquin sent to preferred pharmacy. Patient's daughter is aware and voiced her understanding.  Nothing further needed.

## 2022-12-20 NOTE — Telephone Encounter (Signed)
Pharamacy called stating the Brovana inhaler is 120 for a co pay, could pt just use his albuterol inhaler ? If so pt needs new prescription, if not what inhaler could be used as an alternative.   Dr. Sherene Sires please advise.

## 2022-12-20 NOTE — Telephone Encounter (Signed)
Pharmacy called in because the medicine Brovana co-pay is $120 and they want to know if it will be okay for him to continue the use of albuterol and if so please send in a new prescription.

## 2022-12-29 NOTE — Progress Notes (Deleted)
Gregory Mitchell, male    DOB: [birth year]    MRN: [id number]   Brief patient profile:  7  yobm  quit smoking 1978 no apparent  resp sequelae referred to pulmonary clinic in [facility]  01/28/2022 for copd eval  by Randall An PA following in cards clinic for severe chf     Prior pulmonary eval 2015 c/o doe x 2 y with GOLD 3 criteria at that point with hrct 11/16/13 with bronchiectasis ? MAI   Rx 02 around 2021  ? After covid ?   History of Present Illness  01/28/2022  Pulmonary/ 1st Mitchell eval/ Gregory Mitchell / Gregory Mitchell Mitchell / trelegy maint Chief Complaint  Patient presents with   Consult    Ref by cardiology for copd  does have O2 at home he uses prn   Dyspnea:  maybe 100 ft at grocery handicap parking  Cough: x  years worse x one year p covid 19 in 05/2020 along with 02 need assoc with hoarsness / min  white mucus production worse in ams Sleep:on side flat bed 2 pillows  SABA use: not really helping  02 :  3lpm hs and prn but not titrating  Rec Stop trelegy and the ventolin / albuterol  Pantoprazole 40 mg Take 30- 60 min before your first and last meals of the day  Take duoneb  four times daily  Add budesonide 0.25 mg twice daily to the nebulizer  Prednisone 10 mg take  4 each am x 2 days,   2 each am x 2 days,  1 each am x 2 days and stop     Allergy screen 01/28/22  >  Eos 0.2 /  IgE  / ESR 41.  Wear 02 so you keep your 02 saturation s above 90% at all time  For cough > mucinex 1200 mg every 12 hours and Use the flutter valve as much possible Please schedule a follow up Mitchell visit in 4 weeks, sooner if needed  with all medications /inhalers/ solutions     02/24/2022  f/u ov/[place] Mitchell/Dmani Mizer re: copd/uacs  maint on nebs but confused on which ones when / also still on entresto   Chief Complaint  Patient presents with   Follow-up    Feels breathing is getting worse.   Dyspnea:  food [employer] leaning on cart/ s 02  Cough: yellowish better p levaquin then worse again   Sleeping: on bed is level/flat with 2 pillows  SABA use: not much hfa 02: 4lpm 24/7  Covid status: vax x 2  Rec Protonix 40 mg Take 30- 60 min before your first and last meals of the day  - new prescription today  Prednisone Take 4 for three days 3 for three days 2 for three days 1 for three days and stop  Levaquin 500 mg daily  x 7 days  Plan A = Automatic = Always=    Aformoterol 15 mcg 1st thing in am and 12 hours and budsonide  0.5  Plan B = Backup (to supplement plan A, not to replace it) Only use your albuterol nebulizer  as a rescue medication Stop fish oil  GERD diet     Admit date: 02/26/2022 Discharge date: 03/02/2022  Discharge to: Home with home health to follow-up with pulmonology and cardiology as well as PCP Discharge Diagnoses:  Pneumonia of left lower lobe due to infectious organism COPD (chronic obstructive pulmonary disease) (CMS-HCC) Heart failure with reduced ejection fraction (CMS-HCC) History of pulmonary embolus (PE)  Diabetes mellitus type 2, noninsulin dependent (CMS-HCC) Dysphagia Resolved Problems: Shortness of breath TIA (transient ischemic attack) Atypical chest pain   Procedures: CT head 7/22 CT abdomen and pelvis, chest 7/22 Chest x-ray 7/22 MRI brain 7/24 V/Q scan 7/24 Venous duplex bilateral lower extremities 7/24       03/08/2022  f/u ov/[place] Mitchell/Gregory Mitchell re: GOLD 3  maint on formoterol  but thoroughly confused with meds/ [facility]ins from [facility] Complaint  Patient presents with   Follow-up    Patient feels he is about the same since last visit 2 weeks ago  Dyspnea:  walked in with cane x 50 fts  Cough: yellow mucus looks the same on / off levaquin Sleeping: 4lpm and flat bed/ 2 pillows  SABA use: confused  02: 4lpm hs and prn  Covid status: vax x 3  Rec Protonix (pantoprazole) 40 mg Take 30- 60 min before your first and last meals of the day  - new prescription today  Plan A = Automatic = Always=    Aformoterol 15  mcg 1st thing in am and 12 hours and budsonide 0.25 twice daily  Plan B = Backup (to supplement plan A, not to replace it) Only use your albuterol nebulizer  as a rescue medication  GERD rx Stop entresto  Start valsartan  For cough > mucinex dm 1200 mg every 12 hours and use flutter valve as much as possible   Please schedule a follow up Mitchell visit in 4 weeks, sooner if needed  with all medications /inhalers/ solutions/ flutter valve in hand       Discharge Date:  04/16/2022  1)Please talk to your cardiologist about restarting SACUBITRIL-VALSARTAN 24-26 MG PO TABS/Entresto.  Which is a medication for heart failure.. And possibly restarting apixaban/Eliquis which is a blood thinner 2)Avoid ibuprofen/Advil/Aleve/Motrin/Goody Powders/Naproxen/BC powders/Meloxicam/Diclofenac/Indomethacin and other Nonsteroidal anti-inflammatory medications as these will make you more likely to bleed and can cause stomach ulcers, can also cause Kidney problems.  3)You need oxygen at home at  3 to 4 L via nasal cannula continuously while awake and while asleep--- smoking or having open fires around oxygen can cause fire, significant injury and death 4)Repeat CBC and BMP in about a week or so   5)Very low-salt diet advised--- very low-sodium diet advised   6)Weigh yourself daily, call if you gain more than 3 pounds in 1 day or more than 5 pounds in 1 week as your diuretic medications may need to be adjusted     Admission Diagnosis  COPD exacerbation (HCC) [J44.1] Acute respiratory failure with hypoxia (HCC) [J96.01] PNA (pneumonia) [J18.9]     Discharge Diagnosis  COPD exacerbation (HCC) [J44.1] Acute respiratory failure with hypoxia (HCC) [J96.01] PNA (pneumonia) [J18.9]     Principal Problem:   PNA (pneumonia)   Type 2 diabetes mellitus (HCC)   Essential hypertension   COPD GOLD 3 / obstructive bronchiectasis by HRCT in 2015    Chronic kidney disease, stage 3a (HCC)   Chronic combined systolic and  diastolic CHF (congestive heart failure) (HCC)   Pulmonary embolism (HCC)   Chronic respiratory failure with hypoxia (HCC)   Speech abnormality   04/22/2022  post hosp x 2 f/u ov/[place] Mitchell/Gregory Mitchell re: GOLD 3  maint on confused again re nebs/inhalers - uses bubble pack for pills and did bring the receipts for those   Chief Complaint  Patient presents with   Follow-up    He has improved some since last OV.   Dyspnea:  food lion  walking pushing cart s 02  Cough: some better off entresto  Sleeping: flat bed 2 pillows ok  SABA use: twice a daily inhaler/ also neb but not sure what  02: 3lpm 24/7  Rec Continue budesonide with the other nebulizer solution twice daily but call me with the name Work on inhaler technique: Make sure you check your oxygen saturation  AT  your highest level of activity (not after you stop)   to be sure it stays over 90%        05/06/2022  f/u ov/[place] Mitchell/Gregory Mitchell re: GOLD 3 copd/bronchiectasis   maint on performist/ budesonide   Chief Complaint  Patient presents with   Follow-up    Breathing is not doing well today   Dyspnea:  did food lion one week prior to OV  /  50 ft to mailbox s 02 Cough: no  Sleeping: flat bed 2pillows  SABA use: last used weeks  02: 3lpm hs and prn daytime  Covid status: vax x 2 or 3  Nose drips a lot / nothing purulent  Rec My Mitchell will be contacting you by phone for referral to ENT   - if you don't hear back from my Mitchell within one week please call us back or notify us thru MyChart and we'll address it right away.  Use purse lip breathing when you wheeze For cough > mucinex dm 1200 mg every 12 hours and use flutter valve as much as possible   Plan A = Automatic = Always=    Aformoterol/bud 1st thing in am and 12 hours later  Plan B = Backup (to supplement plan A, not to replace it) Only use your ipatropium/albuterol inhaler as a rescue medication  If worse > Prednisone 10 mg take  4 each am x 2 days,   2 each am x  2 days,  1 each am x 2 days and stop   Please schedule a follow up visit in 2 months but call sooner if needed      07/14/2022  f/u ov/[place] Mitchell/Gregory Mitchell re: f/u  maint on albuterol / bud  Chief Complaint  Patient presents with   Follow-up    Chest pain that is radiating to his stomach. States this started today and wanted to f/u with Dr. Sherene Sires before going to ER   Onset of cp x 5 days worse with walking and with deep breath -  better supine no assoc   no nausea or vomiting  Dyspnea:  more sob with onset / not using gerd rx bid  Cough: none  Sleeping: flat bed with 2 pillows  SABA use: neb helps  02: 3lpm hs and prn  Last pred several weeks ago helped breathing / no real flare of cough since  Rec Go directly to ER / ER nurse is Herbert Seta knows you are coming  Bring in all inhalers and prednisone prescriptions to be sure you have what you need   Admit date: 11/17/2022 Discharge date: 11/18/2022   Recommendations for Outpatient Follow-up:  Follow outpatient CBC/CMP  Follow blood pressure outpatient - on the soft side, may need further adjustment Torsemide as needed, adjust based on volume status, etc Consider GI follow up, ? Esophagitis, odynophagia, sliding hiatal hernia Needs pulm follow up for concern for MAC    Discharge Diagnoses:  Principal Problem:   AKI (acute kidney injury) Active Problems:   Shortness of breath History of present illness:  SUNNY DASINGER is a 82 y.o. male with medical history  significant of systolic heart failure, COPD on 3 L at baseline, HTN, CKD IIIa and multiple other medical issues presenting with weakness, shortness of breath, and chest pain.  Noted to have CT findings concerning for MAC.  Outpatient follow up planned. Esophagram with hiatal hernia, already taking PPI.  Esophagitis on imaging, continue PPI, consider GI follow up. Needs cards follow up with soft BP, his spironolactone, valsartan, jardiance being held on discharge.  Metoprolol dose  decreased and torsemide to be as needed for now.  Needs cards follow up.   Hospital Course:  Assessment and Plan:   Shortness of Breath  Chronic Hypoxic Respiratory Failure  COPD  Concern for MAC Currently on home O2 CT with findings concerning for atypical infectious process like MAC - bronchiectasis and parencymal densities in the RUL, LUL, lingula, and both lower lobes S/p steroids and neb in ED, not wheezing now, hold further steroids for now.  Hold abx for now. Nebulizers prn Given concern for MAC, requested outpatient appt with pulmonology, appreciate Dr. Sherene Sires assistance   Chest Pain  Odynophagia Daughter notes this is a chronic complaint, daily for the past year almost Troponins low and flat, not c/w ACS ? Component of odynophagia? Esophagram -> limited exam, hiatal hernia CT with findings consistent with esophagitis -> PPI BID, follow with GI outpatient   AKI on CKD IIIb Creatinine 2.11 at d/c Follow, hold arb, jardiance, spironolactone Improving, follow outpatient - resuming torsemide as needed     Hypotension Daughter notes SBP in the 60's at home.  Continue metoprolol for now if tolerated. Stop spironolactone and valsartan and jardiance Resume torsemide  on an as needed basis  will schedule midodrine rather than prn for now   Systolic Heart Failure EF 20-25% 07/2022 Appears euvolemic Due to soft blood pressure, decrease metoprolol to 12.5 mg, stop valsartan and spironolactone and jardiance.  Resume torsemide as needed dosing.  Given precautions for low blood pressures and instructions for meds.   Follow with cards outpatient       11/22/2022  f/u ov/[place] Mitchell/Gregory Mitchell re: Bronchiectasis/ GOLD 3 copd/02 dep maint on ? (Very confused again with maint vs prns   Chief Complaint  Patient presents with   Follow-up    Pt f/u was admitted @ [facility] from 4/10-4/11. Pt states hospital performed a CT and seen something abnormal on his lungs and wanted him to f/u w/  pulmonologist    Dyspnea:  at rest  Cough: very congested / not using flutter / mucus white  Sleeping: bricks x 6 in / 2 pillows /insomnia s noct resp cc  SABA use: duoneb q 4 and formoterol/bud bid  02: 3lpm 24/7  Cp x > one year? Etiology diffuse R ant and worse with coughing fits  Rec Plan A = Automatic = Always=  Aformoterol 15 mcg vial every 12 hours (not as needed) Plan B = Backup (to supplement plan A, not to replace it) Only use your albuterol- ipatropium nebulizer up to very 4 hours as needed if you can't catch your breath  Plan C =  Prednisone Take for 6 days if losing group :  Prednisone 10 mg take  4 each am x 2 days,   2 each am x 2 days,  1 each am x 2 days and stop  Mucinex 1200 mg every 12 hours and use the flutter valve  Levaquin 500 mg daily x 7 days (stop if ankle pain/ tendon pain  Remeron 15 mg try x 2 at bedtime  Please schedule a follow up Mitchell visit in 4 weeks, sooner if needed  with all active medications /inhalers/ solutions in hand    12/30/2022  f/u ov/[place] Mitchell/Gregory Mitchell re: GOLD 3 copd/ bronchiectasis maint on *** did *** bring meds No chief complaint on file.   Dyspnea:  *** Cough: *** Sleeping: *** SABA use: *** 02: *** Covid status: *** Lung cancer screening: ***   No obvious day to day or daytime variability or assoc excess/ purulent sputum or mucus plugs or hemoptysis or cp or chest tightness, subjective wheeze or overt sinus or hb symptoms.   *** without nocturnal  or early am exacerbation  of respiratory  c/o's or need for noct saba. Also denies any obvious fluctuation of symptoms with weather or environmental changes or other aggravating or alleviating factors except as outlined above   No unusual exposure hx or h/o childhood pna/ asthma or knowledge of premature birth.  Current Allergies, Complete Past Medical History, Past Surgical History, Family History, and Social History were reviewed in [employer]  record.  ROS  The following are not active complaints unless bolded Hoarseness, sore throat, dysphagia, dental problems, itching, sneezing,  nasal congestion or discharge of excess mucus or purulent secretions, ear ache,   fever, chills, sweats, unintended wt loss or wt gain, classically pleuritic or exertional cp,  orthopnea pnd or arm/hand swelling  or leg swelling, presyncope, palpitations, abdominal pain, anorexia, nausea, vomiting, diarrhea  or change in bowel habits or change in bladder habits, change in stools or change in urine, dysuria, hematuria,  rash, arthralgias, visual complaints, headache, numbness, weakness or ataxia or problems with walking or coordination,  change in mood or  memory.        No outpatient medications have been marked as taking for the 12/30/22 encounter (Appointment) with Nyoka Cowden, MD.                          Past Medical History:  Diagnosis Date   Asthma    Chronic renal insufficiency    COPD (chronic obstructive pulmonary disease) (HCC)    Heart failure (HCC)    a. EF 25-30% in 08/2019 with cath showing normal cors b. EF 20-25% in 09/2020 c. 30-35% in 02/2021 d. EF 20-25% in 10/2021   Hypertension    Pulmonary emboli (HCC) 03/2019   Renal insufficiency    Type 2 diabetes mellitus (HCC)        Objective:    Wts  12/30/2022        ***  11/22/2022        123  07/14/2022       134   05/06/2022       139 04/22/2022       144  03/08/2022       148   02/24/22 151 lb 3.2 oz (68.6 kg)  01/28/22 148 lb (67.1 kg)  12/18/21 149 lb (67.6 kg)    Vital signs reviewed  12/30/2022  - Note at rest 02 sats  ***% on ***   General appearance:    ***    Mild bar difficulty getting out of chair on wobbly legs***                Assessment

## 2022-12-30 ENCOUNTER — Ambulatory Visit: Payer: Medicare HMO | Admitting: Internal Medicine

## 2022-12-31 ENCOUNTER — Ambulatory Visit: Payer: Medicare HMO | Attending: Nurse Practitioner | Admitting: Nurse Practitioner

## 2022-12-31 NOTE — Progress Notes (Deleted)
Virtual Visit via Telephone Note   Because of Gregory Mitchell's co-morbid illnesses, he is at least at moderate risk for complications without adequate follow up.  This format is felt to be most appropriate for this patient at this time.  The patient did not have access to video technology/had technical difficulties with video requiring transitioning to audio format only (telephone).  All issues noted in this document were discussed and addressed.  No physical exam could be performed with this format.  Please refer to the patient's chart for his consent to telehealth for [facility].    Date:  12/31/2022   [name], DOB 1941-05-08, MRN [id number] The patient was identified using 2 identifiers.  Patient Location: Home Provider Location: Home Office   PCP:  Toma Deiters, MD   [facility] Providers Cardiologist:  Dina Rich, MD     Evaluation Performed:  Follow-Up Visit  Chief Complaint:  Here for HF follow-up, here to discuss goals of care.  History of Present Illness:    Gregory Mitchell is a 82 y.o. male with a PMH of chronic CHF, hypertension, hyperlipidemia, history of PE, history of longstanding chest pain, COPD (wears 3 liters of oxygen at night), former smoker, history of epigastric pain and melena, who presents today via telephone visit for CHF follow-up.  Previous CV history includes reduced ejection fraction with improvement in 2014.  History of cardiac catheterization in 2013 that showed nonobstructive CAD.  TTE in 2017 showed EF 55 to 60%, grade 1 DD.  Admitted in 2021 with volume overload. TTE revealed EF 25 to 30%, grade 2 DD, mild MR and normal RV. LHC revealed normal coronary arteries, mean PA 17, PCWP 17, LVEDP 13, and CI 3.  Diagnosed with bilateral PEs during admission in 2020, appears unprovoked.  No prior history of blood clots.  TTE in 2022 showed EF 20 to 25%, grade 3 DD, normal RV.  Has been followed in CHF clinic though has  difficulty with transportation.  Echo 02/2021 showed EF 30 to 35%, mild RV dysfunction.  CHF clinic recommended cardiac MRI.   Last seen by Dr. Dina Rich on March 23, 2022.  He was taking torsemide as needed.  Due to his ongoing edema, was advised to take torsemide for the next 3 days and then to resume as needed.  Entresto was changed to valsartan.  Because his blood pressures were soft, was not on Aldactone at the time.     Has had a long history of atypical symptoms of chest pain.  Described as a tightness mid chest, can occur at rest or with activity, 10 out of 10 in severity, not positional, worse with eating, better with fluid pill and with nebulizer, unable to determine how long it lasted in duration. Was pending GI endoscopic.    Admitted in 07/2022 with abdominal pain and concern for GI bleed with melena.  EGD on July 16, 2022 with findings of Schatzki's ring s/p dilation, intact dundoplication with protruding sutures, otherwise normal-appearing stomach.  Was placed on clear liquid diet with plan to advance to advance diet as tolerated and discharge home.    Our office was contacted in December, and Dr. Wyline Mood spoke with pulmonologist who felt that it was best to go from [facility] back to [facility].     First saw him for follow-up. Was not doing well. CC was weakness, SHOB, feet/ankle swelling, and similar, ongoing chest pain. Underwent endoscopy and had esophageal dilatation  performed.  Daughter presents concern that patient's systolic blood pressure at home is low (< 100), rechecks this to make sure. Because of diminished scattered crackles noted to auscultation in office that day, I was concerned patient was showing signs and symptoms of pneumonia and recommended ED evaluation.  Patient declined, discussed risk of not being seen in the ED.  Case was discussed with Dr. Wyline Mood who recommended continuing valsartan instead of starting Entresto.    [facility] on January 14 and  was instructed to head to ED based on recent chest x-ray and lab results.  Troponins x 2 were flat.  BNP 564.  BP on arrival 180/54.  Creatinine 1.59 baseline creatinine at 1.0-1.2.  CXR showed chronic patchy consolidation, reticulation and bronchiectasis at left greater than right lung base with slightly worse than left lung base consolidation, suggesting worsening of chronic/recurrent infection such as due to recurrent aspiration.  Treated with IV antibiotics.  Tested negative for COVID, flu, RSV and respiratory panel.  Was discharged on Augmentin and 5 days of prednisone for COPD exacerbation.   A few days later after discharge, presented to [facility] for COPD exacerbation.  12-lead EKG showed sinus rhythm with first-degree AV block, RBBB, T wave abnormality.  CXR showed progressive left basilar airspace infiltrate in keeping with acute infection/aspiration.  He was prescribed doxycycline, prednisone, Lasix, and Zofran.  Was discharged home to self-care.   Saw Dr. Freida Busman with CHF clinic early Feb 2024. Was volume overloaded and symptomatic. Torsemide increased to 20 mg daily x 4 days, then 20 mg every other day with close follow-up of labs. Started on low dose Aldactone.    Saw pt for follow up and continued to state he was not doing well. CC of weakness and chest pain, similar type of CP as previously. CP occurred around mealtime and noticed with swallowing, ongoing x few months, daily.  Admitted to shortness of breath with his symptoms.  Not staying well hydrated. Reviewed recent CXR results on 08/22/2022 that showed findings consistent with recurrent aspiration. Daughter admitted to occasional low SBP readings as last visit with me. 2 view CXR was negative for anything acute. Pulmonary and GI referrals were made for concern of aspiration. Midodrine was added to medication regimen.    Presented to [facility] with CP on 09/25/2022. BP on arrival 87/65. Was having lower extremity weakness, 3/5. Monitor showed  A-fib, HR 95. Was given IV fluids. Troponins flat. Resp panel negative. BNP 791. Received IV diuretic. Rest of labs stable. CXR revealed diffuse pulmonary congestion. Told to f/u with outpatient cardiology.    At last visit with me, was still not doing well. No changes since last OV, reported same symptoms. Daughter says midodrine helped BP some. Pt stated he needed and ABX and prednisone. Referred to GI and Pulmonology.    Saw HF clinic 10/04/2022. Reported feeling poor and weak. Was found to be volume overloaded. Torsemide was increased. Valsartan decreased to 40 mg daily. Anderson Malta. [place], NP noted patient to be end stage CHF, recommended to discuss Palliative Care soon.    Saw patient in office last on 11/09/2022. He continued to state that he was not doing well. Similar symptoms from last visit. Daughter stated his weakness was getting worse. Was unable to get to office visits, wasn't able to follow-up with GI and Pulmonology. Daughter stated BP remained the same. Was unable to get weight, appeared to have lost weight. Goals of care reviewed and discussed. Telephone visit set  up to continue conversation.    Admitted to [facility] 4/10-4/11 for weakness, shortness of breath, and chest pain. CT scan noted to have findings concerning for MAC with stable areas of bronchiectasis, parenchymal densities in right upper lobe, left upper lobe and both lower lobes. Esophagram showed esophagitis, small sliding type hiatal hernia, no mass or stricture noted.  Recommended continue PPI twice daily to follow-up with GI outpatient.  Hospital course noted by AKI on CKD stage IIIb.  Due to patient's low blood pressures, metoprolol decreased to 12.5 mg, valsartan, Aldactone, and Jardiance were d/c.  Torsemide changed to as needed. Recommended to follow-up with Cardiology outpatient.  Today he presents for telephone follow-up visit with his daughter.  Daughter acts as historian.  Daughter states patient is doing the same,  feels a little better after seeing Dr. Sherene Sires recently who started him on prednisone and antibiotic.  Blood pressures are doing about the same, most recent blood pressure 127/87 per her report.  He is on scheduled midodrine.  Continuing to take valsartan despite the fact that hospitalist stopped this medication. Pt continues to note chronic chest pain, similar/unchanged from previous visit.  At this point, daughter states he is unable to make outpatient visits.  She stated after family conversation, they are willing to proceed with palliative care/home health referral.  Patient and daughter deny any other questions or concerns today.     Past Medical History:  Diagnosis Date   Asthma    Chronic renal insufficiency    COPD (chronic obstructive pulmonary disease) (HCC)    Heart failure (HCC)    a. EF 25-30% in 08/2019 with cath showing normal cors b. EF 20-25% in 09/2020 c. 30-35% in 02/2021 d. EF 20-25% in 10/2021   Hypertension    Pulmonary emboli (HCC) 03/2019   Renal insufficiency    Type 2 diabetes mellitus (HCC)    Past Surgical History:  Procedure Laterality Date   APPENDECTOMY     BALLOON DILATION N/A 03/18/2020   Procedure: BALLOON DILATION;  Surgeon: Lanelle Bal, DO;  Location: [facility];  Service: Endoscopy;  Laterality: N/A;   CHOLECYSTECTOMY     COLONOSCOPY  10/2006   Dr.Anwar:normal   ESOPHAGEAL BRUSHING  08/24/2021   Procedure: ESOPHAGEAL BRUSHING;  Surgeon: Lanelle Bal, DO;  Location: [facility];  Service: Endoscopy;;   ESOPHAGOGASTRODUODENOSCOPY (EGD) WITH PROPOFOL N/A 03/18/2020   Procedure: ESOPHAGOGASTRODUODENOSCOPY (EGD) WITH PROPOFOL;  Surgeon: Lanelle Bal, DO;  Location: [facility];  Service: Endoscopy;  Laterality: N/A;  2:30pm   ESOPHAGOGASTRODUODENOSCOPY (EGD) WITH PROPOFOL N/A 08/24/2021   Procedure: ESOPHAGOGASTRODUODENOSCOPY (EGD) WITH PROPOFOL;  Surgeon: Lanelle Bal, DO;  Location: [facility];  Service: Endoscopy;  Laterality:  N/A;  with dilation   ESOPHAGOGASTRODUODENOSCOPY (EGD) WITH PROPOFOL N/A 07/16/2022   Procedure: ESOPHAGOGASTRODUODENOSCOPY (EGD) WITH PROPOFOL;  Surgeon: Corbin Ade, MD;  Location: [facility];  Service: Endoscopy;  Laterality: N/A;   FLEXIBLE SIGMOIDOSCOPY N/A 08/24/2021   Procedure: FLEXIBLE SIGMOIDOSCOPY;  Surgeon: Lanelle Bal, DO;  Location: [facility];  Service: Endoscopy;  Laterality: N/A;   MALONEY DILATION  07/16/2022   Procedure: Elease Hashimoto DILATION;  Surgeon: Corbin Ade, MD;  Location: [facility];  Service: Endoscopy;;   NASAL SINUS SURGERY     NEPHRECTOMY     RIGHT (Question CA.  No further therapy needed.)   RIGHT/LEFT HEART CATH AND CORONARY ANGIOGRAPHY N/A 09/03/2019   Procedure: RIGHT/LEFT HEART CATH AND CORONARY ANGIOGRAPHY;  Surgeon:  Runell Gess, MD;  Location: [facility]VAS[facility];  Service: Cardiovascular;  Laterality: N/A;     Current Meds  Medication Sig   FARXIGA 10 MG TABS tablet Take 10 mg by mouth every morning.   valsartan (DIOVAN) 40 MG tablet Take 40 mg by mouth daily.     Allergies:   Patient has no known allergies.   Social History   Tobacco Use   Smoking status: Former    Packs/day: 1.00    Years: 15.00    Additional pack years: 0.00    Total pack years: 15.00    Types: Cigarettes    Start date: 07/14/1957    Quit date: 08/09/1976    Years since quitting: 46.4    Passive exposure: Never   Smokeless tobacco: Former    Types: Chew    Quit date: 02/10/2006   Tobacco comments:    chewed tobacco for 10 years  Vaping Use   Vaping Use: Never used  Substance Use Topics   Alcohol use: Not Currently    Alcohol/week: 0.0 standard drinks of alcohol    Comment: no etoh since he was in his 21s   Drug use: Never     Family Hx: The patient's family history includes Breast cancer in his mother; Lung cancer in his brother; Stroke in his mother. There is no history of Colon cancer.  ROS:   Please see the history of present illness.     All other systems reviewed and are negative.   Prior CV studies:   The following studies were reviewed today:  Esophogram 11/18/2022: FINDINGS: Exam is somewhat limited as the patient only took small sips of contrast. Also, patient could not stand and therefore was imaged in approximately 45 degree upright position. No definite mass or stricture is noted. Small sliding-type hiatal hernia is noted. No definite reflux was noted   IMPRESSION: Limited exam as described above. Small hiatal hernia. No definite mass or stricture is noted.  CT chest 11/17/2022: 1. Bilateral multilobar tree-in-bud opacities and micro nodularity with mild progression in the right lower lobe and right upper lobe. This is most likely due to an atypical infectious process such as Mycobacterium avium complex. 2. Stable areas of cylindrical bronchiectasis and associated parenchymal densities the right upper lobe, left upper lobe/lingula and both lower lobes. 3. Mild-to-moderate diffuse esophageal wall thickening, most likely due to esophagitis. 4. Calcific coronary artery and aortic atherosclerosis. 5. Diffuse low-density of the blood relative to the arterial walls, compatible with anemia.   Aortic Atherosclerosis (ICD10-I70.0).  1 view CXR on 09/25/2022:  MPRESSION: Diffuse bilateral interstitial pulmonary opacity, consistent with edema or atypical/viral infection. No focal airspace opacity.   2 view CXR on 09/21/2022:  Chronic changes without acute findings.    2 view CXR on 08/22/2022:  IMPRESSION: Chronic patchy consolidation, reticulation and bronchiectasis at the left greater than right lung bases, with slightly worsened left lung base consolidation, suggesting worsening of chronic/recurrent infection such as due to recurrent aspiration.   Echocardiogram on July 15, 2022:  1. Left ventricular ejection fraction, by estimation, is 20 to 25%. The  left ventricle has severely decreased function.  The left ventricle  demonstrates global hypokinesis. The left ventricular internal cavity size  was moderately dilated. There is mild  left ventricular hypertrophy. Left ventricular diastolic parameters are  indeterminate.   2. Right ventricular systolic function is normal. The right ventricular  size is normal. There is normal pulmonary artery systolic pressure.   3. Left  atrial size was severely dilated.   4. Right atrial size was severely dilated.   5. The mitral valve is normal in structure. No evidence of mitral valve  regurgitation. No evidence of mitral stenosis.   6. The tricuspid valve is abnormal.   7. The aortic valve is tricuspid. Aortic valve regurgitation is not  visualized. No aortic stenosis is present.   8. The inferior vena cava is normal in size with greater than 50%  respiratory variability, suggesting right atrial pressure of 3 mmHg.   Right left heart cath on September 03, 2019: Mr. Grise has normal coronary arteries and low filling pressures suggesting that he has been adequately or over diuresed. He did have a high V waves suggesting that he potentially has significant mitral regurgitation. His systolic pressures were in the mid to high 90s and I therefore gave him a 250 cc bolus of saline given his low LVEDP and wedge. He will need guideline directed optimal medical therapy for his LV dysfunction. The sheath removed and a TR band was placed on the right wrist to achieve patent hemostasis. The patient left lab in stable condition. His renal function be carefully monitored. Dr. Bufford Buttner was notified of these results.   Myoview on Dec 11, 2014: IMPRESSION: 1. Moderate size region of inferior wall myocardial scar. No ischemic territories seen. Overlying soft tissue attenuation artifact cannot entirely be ruled out.   2. Mild inferior wall hypokinesis.   3. Left ventricular ejection fraction 48%   4.  Low to intermediate-risk stress test findings*.  Labs/Other Tests  and Data Reviewed:    EKG:  An ECG dated 11/17/2022 was personally reviewed today and demonstrated:  85, bpm, bifascicular block, no acute ischemic changes.  Recent Labs: 07/15/2022: Magnesium 2.2 10/04/2022: B Natriuretic Peptide 335.2 11/18/2022: ALT 12; BUN 38; Creatinine, Ser 1.82; Hemoglobin 7.9; Platelets 312; Potassium 4.0; Sodium 135   Recent Lipid Panel No results found for: "CHOL", "TRIG", "HDL", "CHOLHDL", "LDLCALC", "LDLDIRECT"  Wt Readings from Last 3 Encounters:  11/24/22 124 lb 4.8 oz (56.4 kg)  11/22/22 123 lb 6.4 oz (56 kg)  11/17/22 122 lb (55.3 kg)     Objective:    Vital Signs:  There were no vitals taken for this visit.   Due to nature of today's visit, physical exam was unable to be performed.  ASSESSMENT & PLAN:    Chronic systolic CHF TTE 16/1096 revealed EF 20-25%, unchanged from 10/2021.  Meds recently stopped from past hospital visit due to hypotension. Recommended to hold Valsartan at this time according to hospitalist's instructions. Continue current medication regimen. Cannot uptitrate GDMT at this time, discussed my concern that patient appears to be in end-stage CHF. ED precautions discussed. Low sodium diet, fluid restriction <2L, and daily weights encouraged. Educated to contact our office for weight gain of 2 lbs overnight or 5 lbs in one week. Patient and family agreeable to palliative care/HH. Will contact [facility] for referral to Home-based primary Care/palliative care with MD/APP. Will recommend HH obtain labs at home visit.    Chronic chest pain Similar presentation at last OV. Chronic, stable for 1 year per daughter's report, atypical. Does not sound cardiac in eitology. Says symptoms occur around meal time and reviewed previous CXR results which shows findings consistent of aspiration. Previously placed referral for GI and Pulmonology for pt to be seen ASAP, however pt is unable to get to office visits d/t weakness - see #1. Referring to  HH/palliative care. Cath 2021 showed normal  coronary arteries. ED precautions discussed.    Shortness of breath/ COPD/Bronchiectasis/ MAC lung disease (abnormal CT scan of chest) Improvement since last OV since starting ABX and prednisone. See previous CXR and CT chest results.  Symptomatology and signs consistent with recurrent aspiration pneumonia. Previously referred to GI, however pt has not been able to go to OV d/t weakness - see #1. Referring to HH/palliative care. Continue current medication regimen.  ED precautions discussed.   HTN BP labile and still has low readings, most recent reading per daughter's report was 127/87. Continue midodrine and current medications. Several meds stopped at hospital d/c. Instructed to hold Valsartan for now until next OV.  Encouraged adequate hydration. Discussed to monitor BP at home at least 2 hours after medications and sitting for 5-10 minutes. Continue to monitor and log BP at home. Heart healthy diet encouraged.    5. Aspiration/ Dysphagia, recurrent congestion/PNA? Recent esophagram showed small hiatal hernia, no mass or stricture noted. Previously referred to GI as mentioned above, however unable to make visit at this time d/t weakness, see #1. Referring to HH/palliative care.    6. Poor prognosis Reviewed/discussed goals of care and discussed palliative care/hospice care. Pt and family agreeable to proceed with palliative care referral at this time. Will contact [facility] for Palliative Care MD/APP to visit at home as patient is not able to make office visits at this time.    7. Disposition: Follow up with me or APP in 1 month via telephone visit per daughter's request.    Time:   Today, I have spent 25 minutes with the patient with telehealth technology discussing the above problems.     Medication Adjustments/Labs and Tests Ordered: Current medicines are reviewed at length with the patient today.  Concerns regarding medicines are outlined  above.   Tests Ordered: No orders of the defined types were placed in this encounter.   Medication Changes: No orders of the defined types were placed in this encounter.   Follow Up:  Virtual Visit  in 1 month(s)  Signed, Sharlene Dory, NP  12/31/2022 4:00 PM    [facility]

## 2023-01-26 ENCOUNTER — Other Ambulatory Visit: Payer: Self-pay | Admitting: Cardiology

## 2023-01-26 ENCOUNTER — Other Ambulatory Visit: Payer: Self-pay | Admitting: Internal Medicine

## 2023-02-14 ENCOUNTER — Telehealth: Payer: Self-pay | Admitting: *Deleted

## 2023-02-14 NOTE — Telephone Encounter (Signed)
Patient's daughter called and states patient is out of Duoneb and Rosalyn Gess and would like a refill sent over to [facility].   Patient would like a call back to discuss which one is needed [phone]

## 2023-02-16 NOTE — Telephone Encounter (Signed)
Pt should have refills at [facility] for both the Duoneb and Brovana   As per last AVS- Rosalyn Gess is taken 1 every 12 hours and the Duoneb is every 4 hours AS NEEDED   Called the pt's daughter and there was no answer- LMTCB.

## 2023-02-23 NOTE — Telephone Encounter (Signed)
 Call placed to patient. LMTRC.  

## 2023-02-28 ENCOUNTER — Other Ambulatory Visit: Payer: Self-pay | Admitting: Cardiology

## 2023-04-04 ENCOUNTER — Other Ambulatory Visit: Payer: Self-pay | Admitting: Internal Medicine

## 2023-04-26 ENCOUNTER — Other Ambulatory Visit: Payer: Self-pay | Admitting: Cardiology

## 2023-04-27 ENCOUNTER — Other Ambulatory Visit: Payer: Self-pay

## 2023-04-27 ENCOUNTER — Emergency Department (HOSPITAL_COMMUNITY): Payer: Medicare HMO

## 2023-04-27 ENCOUNTER — Emergency Department (HOSPITAL_COMMUNITY)
Admission: EM | Admit: 2023-04-27 | Discharge: 2023-04-27 | Discharge status: Against Medical Advice | Payer: Medicare HMO | Attending: Emergency Medicine | Admitting: Emergency Medicine

## 2023-04-27 ENCOUNTER — Encounter (HOSPITAL_COMMUNITY): Payer: Self-pay | Admitting: *Deleted

## 2023-04-27 DIAGNOSIS — J449 Chronic obstructive pulmonary disease, unspecified: Secondary | ICD-10-CM | POA: Insufficient documentation

## 2023-04-27 DIAGNOSIS — R0602 Shortness of breath: Secondary | ICD-10-CM | POA: Insufficient documentation

## 2023-04-27 DIAGNOSIS — Z5321 Procedure and treatment not carried out due to patient leaving prior to being seen by health care provider: Secondary | ICD-10-CM | POA: Diagnosis not present

## 2023-04-27 DIAGNOSIS — R079 Chest pain, unspecified: Secondary | ICD-10-CM | POA: Diagnosis present

## 2023-04-27 LAB — BASIC METABOLIC PANEL WITH GFR
Anion gap: 8 (ref 5–15)
BUN: 14 mg/dL (ref 8–23)
CO2: 28 mmol/L (ref 22–32)
Calcium: 8.6 mg/dL — ABNORMAL LOW (ref 8.9–10.3)
Chloride: 104 mmol/L (ref 98–111)
Creatinine, Ser: 1.16 mg/dL (ref 0.61–1.24)
GFR, Estimated: >60 mL/min (ref >60)
Glucose, Bld: 130 mg/dL — ABNORMAL HIGH (ref 70–99)
Potassium: 4.3 mmol/L (ref 3.5–5.1)
Sodium: 140 mmol/L (ref 135–145)

## 2023-04-27 LAB — CBC
HCT: 29.9 % — ABNORMAL LOW (ref 39.0–52.0)
Hemoglobin: 7.9 g/dL — ABNORMAL LOW (ref 13.0–17.0)
MCH: 24.5 pg — ABNORMAL LOW (ref 26.0–34.0)
MCHC: 26.4 g/dL — ABNORMAL LOW (ref 30.0–36.0)
MCV: 92.6 fL (ref 80.0–100.0)
Platelets: 252 10*3/uL (ref 150–400)
RBC: 3.23 MIL/uL — ABNORMAL LOW (ref 4.22–5.81)
RDW: 16.2 % — ABNORMAL HIGH (ref 11.5–15.5)
WBC: 6.3 10*3/uL (ref 4.0–10.5)
nRBC: 0 % (ref 0.0–0.2)

## 2023-04-27 LAB — TROPONIN I (HIGH SENSITIVITY): Troponin I (High Sensitivity): 24 ng/L — ABNORMAL HIGH (ref <18)

## 2023-04-27 NOTE — ED Triage Notes (Signed)
Pt with mid CP since yesterday, pt on home O2 at 2 L/M.  + SOB  pt with hx of COPD.

## 2023-05-05 ENCOUNTER — Other Ambulatory Visit: Payer: Self-pay | Admitting: Cardiology

## 2023-05-09 ENCOUNTER — Other Ambulatory Visit: Payer: Self-pay | Admitting: Internal Medicine

## 2023-05-30 ENCOUNTER — Other Ambulatory Visit: Payer: Self-pay | Admitting: Internal Medicine

## 2023-05-30 NOTE — Telephone Encounter (Signed)
Pt needs appt

## 2023-07-09 ENCOUNTER — Encounter (HOSPITAL_COMMUNITY): Payer: Self-pay

## 2023-08-10 DEATH — deceased
